# Patient Record
Sex: Male | Born: 1965 | Race: White | Hispanic: No | Smoking: Current every day smoker
Health system: Southern US, Community
[De-identification: ages and names within clinical notes are randomized; demographics above are authoritative.]

## PROBLEM LIST (undated history)

## (undated) DIAGNOSIS — F32A Depression, unspecified: Secondary | ICD-10-CM

## (undated) DIAGNOSIS — J40 Bronchitis, not specified as acute or chronic: Secondary | ICD-10-CM

## (undated) DIAGNOSIS — R51 Headache: Secondary | ICD-10-CM

## (undated) DIAGNOSIS — K5790 Diverticulosis of intestine, part unspecified, without perforation or abscess without bleeding: Secondary | ICD-10-CM

## (undated) DIAGNOSIS — F1010 Alcohol abuse, uncomplicated: Secondary | ICD-10-CM

## (undated) DIAGNOSIS — K922 Gastrointestinal hemorrhage, unspecified: Secondary | ICD-10-CM

## (undated) DIAGNOSIS — F17200 Nicotine dependence, unspecified, uncomplicated: Secondary | ICD-10-CM

## (undated) DIAGNOSIS — K649 Unspecified hemorrhoids: Secondary | ICD-10-CM

## (undated) DIAGNOSIS — F319 Bipolar disorder, unspecified: Secondary | ICD-10-CM

## (undated) DIAGNOSIS — K219 Gastro-esophageal reflux disease without esophagitis: Secondary | ICD-10-CM

## (undated) DIAGNOSIS — J439 Emphysema, unspecified: Secondary | ICD-10-CM

## (undated) DIAGNOSIS — R0600 Dyspnea, unspecified: Secondary | ICD-10-CM

## (undated) DIAGNOSIS — K432 Incisional hernia without obstruction or gangrene: Secondary | ICD-10-CM

## (undated) DIAGNOSIS — L0390 Cellulitis, unspecified: Secondary | ICD-10-CM

## (undated) DIAGNOSIS — R748 Abnormal levels of other serum enzymes: Secondary | ICD-10-CM

## (undated) DIAGNOSIS — Z8489 Family history of other specified conditions: Secondary | ICD-10-CM

## (undated) DIAGNOSIS — B977 Papillomavirus as the cause of diseases classified elsewhere: Secondary | ICD-10-CM

## (undated) DIAGNOSIS — I10 Essential (primary) hypertension: Secondary | ICD-10-CM

## (undated) DIAGNOSIS — T8859XA Other complications of anesthesia, initial encounter: Secondary | ICD-10-CM

## (undated) DIAGNOSIS — F419 Anxiety disorder, unspecified: Secondary | ICD-10-CM

## (undated) DIAGNOSIS — Z87898 Personal history of other specified conditions: Secondary | ICD-10-CM

## (undated) DIAGNOSIS — H409 Unspecified glaucoma: Secondary | ICD-10-CM

## (undated) DIAGNOSIS — F329 Major depressive disorder, single episode, unspecified: Secondary | ICD-10-CM

## (undated) DIAGNOSIS — Z8719 Personal history of other diseases of the digestive system: Secondary | ICD-10-CM

## (undated) DIAGNOSIS — G43909 Migraine, unspecified, not intractable, without status migrainosus: Secondary | ICD-10-CM

## (undated) DIAGNOSIS — G8929 Other chronic pain: Secondary | ICD-10-CM

## (undated) DIAGNOSIS — J209 Acute bronchitis, unspecified: Secondary | ICD-10-CM

## (undated) DIAGNOSIS — E162 Hypoglycemia, unspecified: Secondary | ICD-10-CM

## (undated) DIAGNOSIS — A630 Anogenital (venereal) warts: Secondary | ICD-10-CM

## (undated) HISTORY — DX: Anogenital (venereal) warts: A63.0

## (undated) HISTORY — DX: Emphysema, unspecified: J43.9

## (undated) HISTORY — DX: Acute bronchitis, unspecified: J20.9

## (undated) HISTORY — DX: Unspecified hemorrhoids: K64.9

## (undated) HISTORY — PX: WISDOM TOOTH EXTRACTION: SHX21

## (undated) HISTORY — DX: Anxiety disorder, unspecified: F41.9

## (undated) HISTORY — PX: ELBOW FRACTURE SURGERY: SHX616

---
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
## 2003-07-23 DIAGNOSIS — Z8711 Personal history of peptic ulcer disease: Secondary | ICD-10-CM

## 2003-07-23 HISTORY — DX: Personal history of peptic ulcer disease: Z87.11

## 2003-12-21 ENCOUNTER — Inpatient Hospital Stay (HOSPITAL_COMMUNITY): Admission: EM | Admit: 2003-12-21 | Discharge: 2003-12-22 | Payer: Self-pay | Admitting: Emergency Medicine

## 2008-06-13 ENCOUNTER — Emergency Department (HOSPITAL_COMMUNITY): Admission: EM | Admit: 2008-06-13 | Discharge: 2008-06-14 | Payer: Self-pay | Admitting: Emergency Medicine

## 2008-06-16 ENCOUNTER — Emergency Department (HOSPITAL_COMMUNITY): Admission: EM | Admit: 2008-06-16 | Discharge: 2008-06-16 | Payer: Self-pay | Admitting: Emergency Medicine

## 2008-12-19 ENCOUNTER — Emergency Department (HOSPITAL_BASED_OUTPATIENT_CLINIC_OR_DEPARTMENT_OTHER): Admission: EM | Admit: 2008-12-19 | Discharge: 2008-12-20 | Payer: Self-pay | Admitting: Emergency Medicine

## 2008-12-19 ENCOUNTER — Ambulatory Visit: Payer: Self-pay | Admitting: Radiology

## 2010-10-30 LAB — BASIC METABOLIC PANEL
BUN: 11 mg/dL (ref 6–23)
CO2: 25 mEq/L (ref 19–32)
Chloride: 110 mEq/L (ref 96–112)
Creatinine, Ser: 0.8 mg/dL (ref 0.4–1.5)
GFR calc non Af Amer: >60 mL/min (ref >60)
Sodium: 149 mEq/L — ABNORMAL HIGH (ref 135–145)

## 2010-10-30 LAB — CBC
Hemoglobin: 15 g/dL (ref 13.0–17.0)
MCHC: 32.8 g/dL (ref 30.0–36.0)
MCV: 92.2 fL (ref 78.0–100.0)
Platelets: 264 10*3/uL (ref 150–400)
RBC: 4.96 MIL/uL (ref 4.22–5.81)

## 2010-10-30 LAB — URINALYSIS, ROUTINE W REFLEX MICROSCOPIC
Bilirubin Urine: NEGATIVE
Ketones, ur: NEGATIVE mg/dL
Nitrite: NEGATIVE
pH: 5 (ref 5.0–8.0)

## 2010-10-30 LAB — DIFFERENTIAL
Eosinophils Relative: 1 % (ref 0–5)
Lymphocytes Relative: 25 % (ref 12–46)
Lymphs Abs: 1.7 10*3/uL (ref 0.7–4.0)
Monocytes Absolute: 0.5 10*3/uL (ref 0.1–1.0)
Monocytes Relative: 7 % (ref 3–12)
Neutro Abs: 4.7 10*3/uL (ref 1.7–7.7)
Neutrophils Relative %: 67 % (ref 43–77)

## 2010-12-07 NOTE — Op Note (Signed)
NAME:  Timothy [name], Timothy [name]                         ACCOUNT NO.:  [id number]   MEDICAL RECORD NO.:  [id number]                   PATIENT TYPE:  INP   LOCATION:  5707                                 FACILITY:  [facility]   PHYSICIAN:  Danise Edge, M.D.                DATE OF BIRTH:  1966/07/04   DATE OF PROCEDURE:  DATE OF DISCHARGE:                                 OPERATIVE REPORT   PROCEDURE:  Emergency esophagogastroduodenoscopy.   DESCRIPTION OF PROCEDURE:  After obtaining informed consent, Dodge was  placed in the left lateral decubitus position.  He received 100 mcg of  intravenous Phentanyl and 10 mg of intravenous Versed to achieve conscious  sedation for the procedure.  The patient's blood pressure, oxygen  saturation, and cardiac rhythm were monitored throughout the procedure and  documented in the medical record.   The Olympus gastroscope was passed through the posterior hypopharynx in the  proximal esophagus without difficulty.  The hypopharynx, larynx, and vocal  cords appeared normal.   Esophagoscopy:  The proximal, medial, and lower segments of the esophageal  mucosa appear normal.   Gastroscopy:  Retroflex view of the gastric cardia and fundus was normal.  Gastric body, antrum, and pylorus appear normal.   Duodenoscopy:  Upon entering the duodenal bulb, the mucosa in the distal  duodenum bulb appeared red.  There is no bleeding from the duodenal bulb,  and I do not detect ulceration or mucosal abnormalities.  The second and  third portions of the duodenum appear normal.   ASSESSMENT:  Normal esophagogastroduodenoscopy.  Etiology of  gastrointestinal bleeding remains undetermined.   PLAN:  Schedule colonoscopy for tomorrow.                                               Danise Edge, M.D.    MJ/MEDQ  D:  12/21/2003  T:  12/21/2003  Job:  [id number]

## 2010-12-07 NOTE — Op Note (Signed)
NAME:  Timothy [name], Timothy [name]                         ACCOUNT NO.:  [id number]   MEDICAL RECORD NO.:  [id number]                   PATIENT TYPE:  INP   LOCATION:  5707                                 FACILITY:  [facility]   PHYSICIAN:  Danise Edge, M.D.                DATE OF BIRTH:  1966/05/25   DATE OF PROCEDURE:  12/22/2003  DATE OF DISCHARGE:                                 OPERATIVE REPORT   PROCEDURE:  Diagnostic proctocolonoscopy to the cecum.   PROCEDURE INDICATION:  Mr. [name] is a 45 year old male born 1965/08/02.  Mr. [name] was admitted to [facility] through the  emergency room yesterday with gastrointestinal bleeding manifested by the  passage of melenic and then maroon-colored stool.  Emergency  esophagogastroduodenoscopy yesterday was normal.  Diagnostic  proctocolonoscopy is scheduled today.   ENDOSCOPIST:  Danise Edge, M.D.   PREMEDICATION:  Fentanyl 100 mcg, Versed 10 mg.   PROCEDURE:  After obtaining informed consent, Mr. [name] was placed in the  left lateral decubitus position.  I administered intravenous fentanyl and  intravenous Versed to achieve conscious sedation for the procedure.  The  patient's blood pressure, oxygen saturation, and cardiac rhythm were  monitored throughout the procedure and documented in the medical record.   Anal inspection and digital rectal exam were normal.  The Olympus adjustable  pediatric colonoscope was introduced into the rectum and advanced to the  cecum.  Colonic preparation for the exam today was excellent.   Rectum normal.  There are no significant internal or external hemorrhoids.   Sigmoid colon and descending colon:  Left colonic diverticulosis without  bleeding or diverticulitis.  I suspect Mr. [name] had lower gastrointestinal  bleeding secondary to a diverticular bleed, which has resolved.   Splenic flexure normal.   Transverse colon normal.   Hepatic flexure normal.   Ascending colon  normal.   Cecum and ileocecal valve normal.   ASSESSMENT:  1. Left colonic diverticulosis.  2. Otherwise normal proctocolonoscopy to the cecum.   RECOMMENDATIONS:  Mr. [name] has resolved.  I  think he probably had a diverticular bleed due to left colonic  diverticulosis.  He is being discharged from the hospital today in stable  medical condition.  I have recommended that he discontinue his daily Goody  Powder intake.                                               Danise Edge, M.D.    MJ/MEDQ  D:  12/22/2003  T:  12/22/2003  Job:  [id number]

## 2010-12-07 NOTE — H&P (Signed)
NAME:  Timothy [name], Timothy [name]                         ACCOUNT NO.:  [id number]   MEDICAL RECORD NO.:  [id number]                   PATIENT TYPE:  INP   LOCATION:  5707                                 FACILITY:  [facility]   PHYSICIAN:  Danise Edge, M.D.                DATE OF BIRTH:  1965-12-27   DATE OF ADMISSION:  12/21/2003  DATE OF DISCHARGE:                                HISTORY & PHYSICAL   ADMISSION DIAGNOSIS:  Gastrointestinal bleeding.   HISTORY:  Mr. Erie Radu is a 45 year old male residing in [place].  [place], [place].  He is in [place] for the [place] holiday visiting his  family.  Mr. Wyne takes Marlin Canary Powders daily.  He consumes beer in  moderation.  Last night, he developed epigastric discomfort, nausea, coffee  ground emesis, and began passing melenic stool which turned maroon color  this morning associated with lightheadedness.  There is no history of  gastrointestinal bleeding.  He has no chronic medical problems.   ALLERGIES:  No known drug allergies.   CHRONIC MEDICATIONS:  Goody Powders.   PAST MEDICAL HISTORY:  1. Appendectomy.  2. Gastroesophageal reflux disease.   FAMILY HISTORY:  Noncontributory.   HABITS:  Denzell does smoke cigarettes and consumes alcohol in moderation.   PHYSICAL EXAMINATION:  VITAL SIGNS:  Blood pressure 164/86, pulse 103 and  regular, room air oxygen saturation 100%.  GENERAL APPEARANCE:  Bohdi is alert and attentive.  HEENT:  Pale ___________conjunctivae.  Nonicteric sclerae.  CARDIAC:  Sinus tachycardia.  No murmurs, clicks, or rubs.  LUNGS:  Clear to auscultation.  ABDOMEN:  Soft, flat, and nontender.  EXTREMITIES:  No edema.   ASSESSMENT:  Gastrointestinal bleeding.                                                Danise Edge, M.D.    MJ/MEDQ  D:  12/21/2003  T:  12/21/2003  Job:  [id number]

## 2011-04-23 LAB — CBC
MCHC: 33.7
Platelets: 239
RBC: 4.56
RDW: 12.4

## 2011-04-23 LAB — DIFFERENTIAL
Eosinophils Relative: 1
Neutro Abs: 9.3 — ABNORMAL HIGH

## 2011-04-23 LAB — POCT I-STAT, CHEM 8
Creatinine, Ser: 0.9
HCT: 45
Hemoglobin: 15.3
TCO2: 25

## 2011-08-12 ENCOUNTER — Emergency Department (HOSPITAL_COMMUNITY)
Admission: EM | Admit: 2011-08-12 | Discharge: 2011-08-13 | Disposition: A | Discharge status: Home / Self Care | Payer: Self-pay | Attending: Emergency Medicine | Admitting: Emergency Medicine

## 2011-08-12 ENCOUNTER — Encounter (HOSPITAL_COMMUNITY): Payer: Self-pay | Admitting: Emergency Medicine

## 2011-08-12 ENCOUNTER — Emergency Department (HOSPITAL_COMMUNITY): Payer: Self-pay

## 2011-08-12 DIAGNOSIS — F17200 Nicotine dependence, unspecified, uncomplicated: Secondary | ICD-10-CM | POA: Insufficient documentation

## 2011-08-12 DIAGNOSIS — R112 Nausea with vomiting, unspecified: Secondary | ICD-10-CM | POA: Insufficient documentation

## 2011-08-12 DIAGNOSIS — R109 Unspecified abdominal pain: Secondary | ICD-10-CM | POA: Insufficient documentation

## 2011-08-12 DIAGNOSIS — K5792 Diverticulitis of intestine, part unspecified, without perforation or abscess without bleeding: Secondary | ICD-10-CM

## 2011-08-12 DIAGNOSIS — R197 Diarrhea, unspecified: Secondary | ICD-10-CM | POA: Insufficient documentation

## 2011-08-12 HISTORY — DX: Cellulitis, unspecified: L03.90

## 2011-08-12 LAB — COMPREHENSIVE METABOLIC PANEL
ALT: 21 U/L (ref 0–53)
AST: 14 U/L (ref 0–37)
Albumin: 3.9 g/dL (ref 3.5–5.2)
Alkaline Phosphatase: 83 U/L (ref 39–117)
BUN: 9 mg/dL (ref 6–23)
CO2: 23 mEq/L (ref 19–32)
Calcium: 9.6 mg/dL (ref 8.4–10.5)
Chloride: 100 mEq/L (ref 96–112)
Creatinine, Ser: 0.76 mg/dL (ref 0.50–1.35)
GFR calc Af Amer: >90 mL/min (ref >90)
GFR calc non Af Amer: >90 mL/min (ref >90)
Glucose, Bld: 104 mg/dL — ABNORMAL HIGH (ref 70–99)
Potassium: 3.3 mEq/L — ABNORMAL LOW (ref 3.5–5.1)
Sodium: 135 mEq/L (ref 135–145)
Total Bilirubin: 0.3 mg/dL (ref 0.3–1.2)
Total Protein: 7.4 g/dL (ref 6.0–8.3)

## 2011-08-12 LAB — URINALYSIS, ROUTINE W REFLEX MICROSCOPIC
Bilirubin Urine: NEGATIVE
Glucose, UA: NEGATIVE mg/dL
Ketones, ur: NEGATIVE mg/dL
Nitrite: NEGATIVE
Protein, ur: NEGATIVE mg/dL
Specific Gravity, Urine: 1.014 (ref 1.005–1.030)
Urobilinogen, UA: 0.2 mg/dL (ref 0.0–1.0)
pH: 6 (ref 5.0–8.0)

## 2011-08-12 LAB — DIFFERENTIAL
Basophils Absolute: 0 10*3/uL (ref 0.0–0.1)
Basophils Relative: 0 % (ref 0–1)
Eosinophils Absolute: 0.1 10*3/uL (ref 0.0–0.7)
Eosinophils Relative: 0 % (ref 0–5)
Lymphocytes Relative: 20 % (ref 12–46)
Lymphs Abs: 2.7 10*3/uL (ref 0.7–4.0)
Monocytes Absolute: 1.7 10*3/uL — ABNORMAL HIGH (ref 0.1–1.0)
Monocytes Relative: 13 % — ABNORMAL HIGH (ref 3–12)
Neutro Abs: 9 10*3/uL — ABNORMAL HIGH (ref 1.7–7.7)
Neutrophils Relative %: 67 % (ref 43–77)

## 2011-08-12 LAB — URINE MICROSCOPIC-ADD ON

## 2011-08-12 LAB — CBC
HCT: 39.4 % (ref 39.0–52.0)
Hemoglobin: 13.5 g/dL (ref 13.0–17.0)
MCH: 30.7 pg (ref 26.0–34.0)
MCHC: 34.3 g/dL (ref 30.0–36.0)
MCV: 89.5 fL (ref 78.0–100.0)
Platelets: 310 10*3/uL (ref 150–400)
RBC: 4.4 MIL/uL (ref 4.22–5.81)
RDW: 12.5 % (ref 11.5–15.5)
WBC: 13.5 10*3/uL — ABNORMAL HIGH (ref 4.0–10.5)

## 2011-08-12 LAB — LIPASE, BLOOD: Lipase: 29 U/L (ref 11–59)

## 2011-08-12 MED ORDER — SODIUM CHLORIDE 0.9 % IV SOLN: INTRAVENOUS | Status: DC

## 2011-08-12 MED ORDER — ONDANSETRON HCL 4 MG/2ML IJ SOLN
4.0000 mg | Freq: Once | INTRAMUSCULAR | Status: AC
Start: 2011-08-12 — End: 2011-08-12
  Administered 2011-08-12: 4 mg via INTRAVENOUS
  Filled 2011-08-12: qty 2

## 2011-08-12 MED ORDER — HYDROCODONE-ACETAMINOPHEN 5-325 MG PO TABS: 2.0000 | ORAL_TABLET | ORAL | Status: AC | PRN

## 2011-08-12 MED ORDER — CIPROFLOXACIN HCL 500 MG PO TABS: 500.0000 mg | ORAL_TABLET | Freq: Two times a day (BID) | ORAL | Status: AC

## 2011-08-12 MED ORDER — IOHEXOL 300 MG/ML  SOLN
100.0000 mL | Freq: Once | INTRAMUSCULAR | Status: AC | PRN
  Administered 2011-08-12: 100 mL via INTRAVENOUS

## 2011-08-12 MED ORDER — METRONIDAZOLE 500 MG PO TABS: 500.0000 mg | ORAL_TABLET | Freq: Two times a day (BID) | ORAL | Status: AC

## 2011-08-12 MED ORDER — POTASSIUM CHLORIDE CRYS ER 20 MEQ PO TBCR
20.0000 meq | EXTENDED_RELEASE_TABLET | Freq: Once | ORAL | Status: AC
  Administered 2011-08-12: 20 meq via ORAL
  Filled 2011-08-12: qty 1

## 2011-08-12 MED ORDER — MORPHINE SULFATE 4 MG/ML IJ SOLN
4.0000 mg | Freq: Once | INTRAMUSCULAR | Status: AC
  Administered 2011-08-12: 4 mg via INTRAVENOUS
  Filled 2011-08-12: qty 1

## 2011-08-12 MED ORDER — PROMETHAZINE HCL 25 MG PO TABS: 25.0000 mg | ORAL_TABLET | Freq: Four times a day (QID) | ORAL | Status: DC | PRN

## 2011-08-12 MED ORDER — SODIUM CHLORIDE 0.9 % IV BOLUS (SEPSIS)
500.0000 mL | Freq: Once | INTRAVENOUS | Status: AC
  Administered 2011-08-12: 1000 mL via INTRAVENOUS

## 2011-08-12 NOTE — ED Notes (Signed)
Per pt., sternal pain two days ago-pain moved to LUQ-hurts to breath, change positions, cough

## 2011-08-12 NOTE — ED Provider Notes (Signed)
History     CSN: [id number]  Arrival date & time 08/12/11  4098   First MD Initiated Contact with Patient 08/12/11 2022      Chief Complaint  Patient presents with  . Abdominal Pain    HPI: Patient is a 46 y.o. male presenting with abdominal pain. The history is provided by the patient.  Abdominal Pain The primary symptoms of the illness include abdominal pain, nausea, vomiting and diarrhea. The primary symptoms of the illness do not include fever or dysuria. The current episode started more than 2 days ago. The onset of the illness was gradual. The problem has been gradually worsening.   patient reports onset of. Umbilical abdominal pain that radiates to the left lower part route on Wednesday that worsened by Thursday and Friday. States that movement coughing or sneezing makes the pain worse. Has had multiple episodes of nausea vomiting and diarrhea since onset of pain. Denies known fever. These symptoms were preceded by approximately one week history of flulike symptoms. Wife states patient has lost approximately 15 pounds in one to 2 weeks because he does not want to eat.. Past Medical History  Diagnosis Date  . Diverticulitis   . Cellulitis     Past Surgical History  Procedure Date  . Appendectomy     No family history on file.  History  Substance Use Topics  . Smoking status: Current Some Day Smoker -- 3.0 packs/day  . Smokeless tobacco: Not on file  . Alcohol Use: Yes     occasionally      Review of Systems  Constitutional: Negative.  Negative for fever.  HENT: Negative.   Eyes: Negative.   Respiratory: Negative.   Cardiovascular: Negative.   Gastrointestinal: Positive for nausea, vomiting, abdominal pain and diarrhea.  Genitourinary: Negative.  Negative for dysuria.  Musculoskeletal: Negative.   Skin: Negative.   Neurological: Negative.   Hematological: Negative.   Psychiatric/Behavioral: Negative.     Allergies  Review of patient's allergies indicates  no known allergies.  Home Medications  No current outpatient prescriptions on file.  BP 141/89  Pulse 129  Temp(Src) 98.2 F (36.8 C) (Oral)  Resp 18  SpO2 96%  Physical Exam  Constitutional: He appears well-developed and well-nourished.  HENT:  Head: Normocephalic and atraumatic.  Eyes: Conjunctivae are normal.  Neck: Neck supple.  Cardiovascular: Normal rate and regular rhythm.   Pulmonary/Chest: Effort normal and breath sounds normal.  Abdominal: Soft. Bowel sounds are normal. There is tenderness.    Musculoskeletal: Normal range of motion.  Neurological: He is alert.  Skin: Skin is warm and dry.  Psychiatric: He has a normal mood and affect.    ED Course  Procedures Findings and clinical impression discussed with patient. Treatment options discussed, hospitalization vs treatment at home with PO antibiotics and medication for pain and nausea. Patient much prefers going home and agrees that he will return if symptoms worsen such as fever, increased nausea, vomiting, diarrhea and or worsening pain. I have discussed patient with Dr. Lars Mage and was in agreement with discharge plan.  Labs Reviewed  CBC - Abnormal; Notable for the following:    WBC 13.5 (*)    All other components within normal limits  DIFFERENTIAL - Abnormal; Notable for the following:    Neutro Abs 9.0 (*)    Monocytes Relative 13 (*)    Monocytes Absolute 1.7 (*)    All other components within normal limits  COMPREHENSIVE METABOLIC PANEL - Abnormal; Notable for the following:  Potassium 3.3 (*)    Glucose, Bld 104 (*)    All other components within normal limits  URINALYSIS, ROUTINE W REFLEX MICROSCOPIC - Abnormal; Notable for the following:    APPearance CLOUDY (*)    Hgb urine dipstick TRACE (*)    Leukocytes, UA MODERATE (*)    All other components within normal limits  LIPASE, BLOOD  OCCULT BLOOD, POC DEVICE  URINE MICROSCOPIC-ADD ON  POCT OCCULT BLOOD STOOL, DEVICE   Ct Abdomen Pelvis W  Contrast  08/12/2011  *RADIOLOGY REPORT*  Clinical Data: Upper quadrant abdominal pain; pain on inspiration.  CT ABDOMEN AND PELVIS WITH CONTRAST  Technique:  Multidetector CT imaging of the abdomen and pelvis was performed following the standard protocol during bolus administration of intravenous contrast.  Contrast: OMNIPAQUE IOHEXOL 300 MG/ML IV SOLN  Comparison: CT of the abdomen and pelvis performed 12/19/2008  Findings: The visualized lung bases are clear.  The liver and spleen are unremarkable in appearance.  The gallbladder is within normal limits.  The pancreas and adrenal glands are unremarkable.  The kidneys are unremarkable in appearance.  There is no evidence of hydronephrosis.  No renal or ureteral stones are seen.  Minimal left-sided perinephric stranding appears to reflect the adjacent colonic inflammatory process.  The small bowel is unremarkable in appearance.  The stomach is within normal limits.  No acute vascular abnormalities are seen. Scattered calcification is noted along the abdominal aorta and its branches, mildly advanced for age.  The patient is status post appendectomy.  Focal soft tissue inflammation is noted along the proximal descending colon, with associated inflamed diverticula and colonic wall thickening, compatible with acute diverticulitis.  Mild free fluid is seen tracking along the left paracolic gutter.  There is no evidence of perforation or abscess formation.  Diffuse diverticulosis is seen along the descending and sigmoid colon.  The bladder is mildly distended and grossly unremarkable in appearance.  The prostate remains normal in size.  A somewhat superiorly positioned right testis is noted; its position is likely transient in nature.  No inguinal lymphadenopathy is seen.  No acute osseous abnormalities are identified.  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   IMPRESSION:  1.  Acute diverticulitis noted along the proximal descending colon, with soft tissue inflammation and mild free fluid tracking along the right paracolic gutter.  No evidence of perforation or abscess formation. 2.  Diffuse diverticulosis noted along the descending and sigmoid colon. 3.  Somewhat superiorly positioned right testis noted; its position is likely transient in nature, though clinical correlation is suggested to exclude an incompletely descended testis. 4.  Scattered calcification along the abdominal aorta and its branches, mildly advanced for age. 5.  Chronic bilateral pars defects at L5, with minimal grade 1 anterolisthesis of L5 on S1, and minimal grade 1 retrolisthesis of L4 on L5.  Original Report Authenticated By: Tonia Ghent, M.D.     No diagnosis found.    MDM  HPi/PE and clinical findings c/w acute diverticulitis w/o perforation or abscess.        Leanne Chang, NP 08/12/11 2312

## 2011-08-13 NOTE — ED Provider Notes (Signed)
Medical screening examination/treatment/procedure(s) were performed by non-physician practitioner and as supervising physician I was immediately available for consultation/collaboration. Devoria Albe, MD, Armando Gang   Ward Givens, MD 08/13/11 1038

## 2011-09-20 DIAGNOSIS — R748 Abnormal levels of other serum enzymes: Secondary | ICD-10-CM

## 2011-09-20 HISTORY — DX: Abnormal levels of other serum enzymes: R74.8

## 2011-10-15 ENCOUNTER — Observation Stay (HOSPITAL_COMMUNITY)
Admission: EM | Admit: 2011-10-15 | Discharge: 2011-10-16 | Disposition: A | Discharge status: Home / Self Care | Payer: Medicaid Other | Attending: Internal Medicine | Admitting: Internal Medicine

## 2011-10-15 ENCOUNTER — Emergency Department (HOSPITAL_COMMUNITY): Payer: Medicaid Other

## 2011-10-15 ENCOUNTER — Encounter (HOSPITAL_COMMUNITY): Payer: Self-pay | Admitting: *Deleted

## 2011-10-15 DIAGNOSIS — R112 Nausea with vomiting, unspecified: Secondary | ICD-10-CM | POA: Insufficient documentation

## 2011-10-15 DIAGNOSIS — Z8719 Personal history of other diseases of the digestive system: Secondary | ICD-10-CM

## 2011-10-15 DIAGNOSIS — F17200 Nicotine dependence, unspecified, uncomplicated: Secondary | ICD-10-CM | POA: Insufficient documentation

## 2011-10-15 DIAGNOSIS — Z720 Tobacco use: Secondary | ICD-10-CM

## 2011-10-15 DIAGNOSIS — R197 Diarrhea, unspecified: Secondary | ICD-10-CM | POA: Insufficient documentation

## 2011-10-15 DIAGNOSIS — K5792 Diverticulitis of intestine, part unspecified, without perforation or abscess without bleeding: Secondary | ICD-10-CM

## 2011-10-15 DIAGNOSIS — F1010 Alcohol abuse, uncomplicated: Secondary | ICD-10-CM | POA: Insufficient documentation

## 2011-10-15 DIAGNOSIS — K922 Gastrointestinal hemorrhage, unspecified: Secondary | ICD-10-CM | POA: Insufficient documentation

## 2011-10-15 DIAGNOSIS — Z79899 Other long term (current) drug therapy: Secondary | ICD-10-CM | POA: Insufficient documentation

## 2011-10-15 DIAGNOSIS — D649 Anemia, unspecified: Secondary | ICD-10-CM | POA: Insufficient documentation

## 2011-10-15 DIAGNOSIS — K5732 Diverticulitis of large intestine without perforation or abscess without bleeding: Principal | ICD-10-CM | POA: Insufficient documentation

## 2011-10-15 DIAGNOSIS — K219 Gastro-esophageal reflux disease without esophagitis: Secondary | ICD-10-CM | POA: Insufficient documentation

## 2011-10-15 DIAGNOSIS — Z7982 Long term (current) use of aspirin: Secondary | ICD-10-CM | POA: Insufficient documentation

## 2011-10-15 HISTORY — DX: Hypoglycemia, unspecified: E16.2

## 2011-10-15 HISTORY — DX: Diverticulosis of intestine, part unspecified, without perforation or abscess without bleeding: K57.90

## 2011-10-15 HISTORY — DX: Alcohol abuse, uncomplicated: F10.10

## 2011-10-15 HISTORY — DX: Gastrointestinal hemorrhage, unspecified: K92.2

## 2011-10-15 HISTORY — DX: Gastro-esophageal reflux disease without esophagitis: K21.9

## 2011-10-15 LAB — PROTIME-INR: INR: 0.93 (ref 0.00–1.49)

## 2011-10-15 LAB — TYPE AND SCREEN
ABO/RH(D): O POS
Antibody Screen: NEGATIVE

## 2011-10-15 LAB — GLUCOSE, CAPILLARY: Glucose-Capillary: 91 mg/dL (ref 70–99)

## 2011-10-15 LAB — CBC
HCT: 26.6 % — ABNORMAL LOW (ref 39.0–52.0)
MCH: 30.7 pg (ref 26.0–34.0)
MCHC: 33.1 g/dL (ref 30.0–36.0)
MCV: 92.2 fL (ref 78.0–100.0)
MCV: 93.3 fL (ref 78.0–100.0)
Platelets: 264 10*3/uL (ref 150–400)
Platelets: 222 10*3/uL (ref 150–400)
RDW: 13.7 % (ref 11.5–15.5)
RDW: 14 % (ref 11.5–15.5)
WBC: 8.2 10*3/uL (ref 4.0–10.5)
WBC: 6.7 10*3/uL (ref 4.0–10.5)

## 2011-10-15 LAB — DIFFERENTIAL
Basophils Absolute: 0 10*3/uL (ref 0.0–0.1)
Eosinophils Absolute: 0.1 10*3/uL (ref 0.0–0.7)
Eosinophils Relative: 1 % (ref 0–5)
Lymphocytes Relative: 33 % (ref 12–46)
Neutrophils Relative %: 59 % (ref 43–77)

## 2011-10-15 LAB — COMPREHENSIVE METABOLIC PANEL
ALT: 16 U/L (ref 0–53)
AST: 18 U/L (ref 0–37)
Albumin: 3.6 g/dL (ref 3.5–5.2)
Calcium: 9 mg/dL (ref 8.4–10.5)
Sodium: 138 mEq/L (ref 135–145)
Total Protein: 6.4 g/dL (ref 6.0–8.3)

## 2011-10-15 LAB — OCCULT BLOOD, POC DEVICE: Fecal Occult Bld: POSITIVE

## 2011-10-15 MED ORDER — CIPROFLOXACIN IN D5W 400 MG/200ML IV SOLN
400.0000 mg | Freq: Once | INTRAVENOUS | Status: AC
  Administered 2011-10-15: 400 mg via INTRAVENOUS
  Filled 2011-10-15: qty 200

## 2011-10-15 MED ORDER — PANTOPRAZOLE SODIUM 40 MG PO TBEC
40.0000 mg | DELAYED_RELEASE_TABLET | Freq: Every day | ORAL | Status: DC
  Administered 2011-10-15: 40 mg via ORAL
  Filled 2011-10-15 (×2): qty 1

## 2011-10-15 MED ORDER — MORPHINE SULFATE 2 MG/ML IJ SOLN: 2.0000 mg | INTRAMUSCULAR | Status: DC | PRN

## 2011-10-15 MED ORDER — CIPROFLOXACIN HCL 500 MG PO TABS
500.0000 mg | ORAL_TABLET | Freq: Two times a day (BID) | ORAL | Status: DC
  Administered 2011-10-15 – 2011-10-16 (×2): 500 mg via ORAL
  Filled 2011-10-15 (×3): qty 1

## 2011-10-15 MED ORDER — SODIUM CHLORIDE 0.9 % IV SOLN: 250.0000 mL | INTRAVENOUS | Status: DC | PRN

## 2011-10-15 MED ORDER — ONDANSETRON HCL 4 MG/2ML IJ SOLN: 4.0000 mg | Freq: Four times a day (QID) | INTRAMUSCULAR | Status: DC | PRN

## 2011-10-15 MED ORDER — SODIUM CHLORIDE 0.9 % IV SOLN
INTRAVENOUS | Status: DC
  Administered 2011-10-15: 10:00:00 via INTRAVENOUS

## 2011-10-15 MED ORDER — ONDANSETRON HCL 4 MG PO TABS: 4.0000 mg | ORAL_TABLET | Freq: Four times a day (QID) | ORAL | Status: DC | PRN

## 2011-10-15 MED ORDER — METRONIDAZOLE 500 MG PO TABS
500.0000 mg | ORAL_TABLET | Freq: Four times a day (QID) | ORAL | Status: DC
  Administered 2011-10-15 – 2011-10-16 (×3): 500 mg via ORAL
  Filled 2011-10-15 (×7): qty 1

## 2011-10-15 MED ORDER — NICOTINE 21 MG/24HR TD PT24
21.0000 mg | MEDICATED_PATCH | Freq: Every day | TRANSDERMAL | Status: DC
  Administered 2011-10-15 – 2011-10-16 (×2): 21 mg via TRANSDERMAL
  Filled 2011-10-15 (×2): qty 1

## 2011-10-15 MED ORDER — SODIUM CHLORIDE 0.9 % IJ SOLN
3.0000 mL | Freq: Two times a day (BID) | INTRAMUSCULAR | Status: DC
  Administered 2011-10-15: 3 mL via INTRAVENOUS

## 2011-10-15 MED ORDER — METRONIDAZOLE IN NACL 5-0.79 MG/ML-% IV SOLN
500.0000 mg | Freq: Once | INTRAVENOUS | Status: AC
  Administered 2011-10-15: 500 mg via INTRAVENOUS
  Filled 2011-10-15: qty 100

## 2011-10-15 MED ORDER — OXYCODONE HCL 5 MG PO TABS: 5.0000 mg | ORAL_TABLET | ORAL | Status: DC | PRN

## 2011-10-15 MED ORDER — LORAZEPAM 0.5 MG PO TABS: 0.5000 mg | ORAL_TABLET | Freq: Four times a day (QID) | ORAL | Status: DC | PRN

## 2011-10-15 MED ORDER — IOHEXOL 300 MG/ML  SOLN
100.0000 mL | Freq: Once | INTRAMUSCULAR | Status: AC | PRN
  Administered 2011-10-15: 100 mL via INTRAVENOUS

## 2011-10-15 MED ORDER — ZOLPIDEM TARTRATE 5 MG PO TABS: 5.0000 mg | ORAL_TABLET | Freq: Every evening | ORAL | Status: DC | PRN

## 2011-10-15 MED ORDER — SODIUM CHLORIDE 0.9 % IJ SOLN: 3.0000 mL | Freq: Two times a day (BID) | INTRAMUSCULAR | Status: DC

## 2011-10-15 MED ORDER — SODIUM CHLORIDE 0.9 % IJ SOLN: 3.0000 mL | INTRAMUSCULAR | Status: DC | PRN

## 2011-10-15 MED ORDER — ALUM & MAG HYDROXIDE-SIMETH 200-200-20 MG/5ML PO SUSP: 30.0000 mL | Freq: Four times a day (QID) | ORAL | Status: DC | PRN

## 2011-10-15 NOTE — ED Notes (Signed)
Pt returned from CT °

## 2011-10-15 NOTE — ED Notes (Signed)
Pt has completed CT contrast. CT informed.

## 2011-10-15 NOTE — H&P (Signed)
History and Physical  Timothy [name] ZOX:[id number] DOB: 10-Dec-1965 DOA: 10/15/2011  Referring physician: Samuel Jester, MD PCP: None  Chief Complaint: nausea, vomiting  HPI:  46 year old man presented to the emergency department today at the insistence of his wife with a history of bleeding and vomiting. Patient reports he had 2 episodes of vomiting yesterday, nonbloody. Multiple episodes of diarrhea yesterday grossly bloody. Last episode diarrhea was 4 PM yesterday. He has had no further bleeding since that time. No further vomiting. He feels quite hungry at this point. He denies any abdominal pain or fever.  Wife reports alcohol abuse and binge drinking. Patient takes Marlin Canary powders on a daily basis. Patient has a history of known diverticulosis by endoscopy 2005 when he was admitted for GI bleed. Upper endoscopy at that time was unremarkable.  Workup in the emergency department was notable for a significant drop in hemoglobin. Possible diverticulitis on CAT scan.  Chart Review:  08/12/2011 ED visit: Diverticulitis. Patient refused admission at that time.  Review of Systems:  Denies fever, changes to his vision, sore throat, rash, muscle aches, chest pain, shortness of breath, dysuria.  Past Medical History  Diagnosis Date  . Diverticulitis   . Hypoglycemia   . GERD (gastroesophageal reflux disease)   . Diverticulosis     By colonoscopy June 2005  . Lower GI bleed     June 2005. Presumably secondary to diverticulosis.  . Alcohol abuse   . Active smoker    Past Surgical History  Procedure Date  . Appendectomy    Social History:  reports that he has been smoking Cigarettes.  He has been smoking about 3 packs per day. He has never used smokeless tobacco. He reports that he drinks alcohol. He reports that he does not use illicit drugs. Wife reports binge drinking. Allergies  Allergen Reactions  . Darvocet (Propoxyphene N-Acetaminophen) Hives   Family History  Problem  Relation Age of Onset  . Diabetes Mother   . Parkinsonism Mother     Prior to Admission medications   Medication Sig Start Date End Date Taking? Authorizing Provider  Aspirin-Acetaminophen-Caffeine (GOODY HEADACHE PO) Take by mouth every 12 (twelve) hours as needed. For headache   Yes Historical Provider, MD  HYDROcodone-acetaminophen (VICODIN) 5-500 MG per tablet Take 1 tablet by mouth every 6 (six) hours as needed. For pain   Yes Historical Provider, MD  ibuprofen (ADVIL,MOTRIN) 200 MG tablet Take 400 mg by mouth every 6 (six) hours as needed. For pain   Yes Historical Provider, MD  promethazine (PHENERGAN) 25 MG tablet Take 25 mg by mouth every 6 (six) hours as needed. For nausea and vomiting   Yes Historical Provider, MD   Physical Exam: Filed Vitals:   10/15/11 8119 10/15/11 0949 10/15/11 0951 10/15/11 0953  BP: 165/95 132/75 131/82 136/86  Pulse: 99 90 91 100  Temp: 97.9 F (36.6 C) 98.3 F (36.8 C)    TempSrc: Oral Oral    Resp: 16 20    SpO2: 100% 99%      General:  Appears calm and comfortable. Exam and in the emergency department. Well-appearing.  Eyes: Pupils equal, round, reactive to light. Normal lids and irises.  ENT: Grossly normal hearing. Normal lips and tongue.  Neck:  No lymphadenopathy or masses. No thyromegaly.  Cardiovascular: Regular rate and rhythm. No murmur, rub, gallop. No lower extremity edema.  Respiratory: Clear to auscultation bilaterally. No wheezes, rales, rhonchi. Normal respiratory effort.  Abdomen: Soft, nontender, nondistended.  Skin: Appears grossly  unremarkable.  Musculoskeletal: Grossly unremarkable.  Psychiatric: Grossly normal mood and affect. Speech fluent and appropriate.  Labs on Admission:  Basic Metabolic Panel:  Lab 10/15/11 1610  NA 138  K 3.9  CL 103  CO2 26  GLUCOSE 101*  BUN 17  CREATININE 0.75  CALCIUM 9.0  MG --  PHOS --   Liver Function Tests:  Lab 10/15/11 1030  AST 18  ALT 16  ALKPHOS 63    BILITOT 0.2*  PROT 6.4  ALBUMIN 3.6    Lab 10/15/11 1030  LIPASE 40  AMYLASE --   CBC:  Lab 10/15/11 1030  WBC 8.2  NEUTROABS 4.8  HGB 9.4*  HCT 28.2*  MCV 92.2  PLT 264   CBG:  Lab 10/15/11 0946  GLUCAP 91   Radiological Exams on Admission: Ct Abdomen Pelvis W Contrast  10/15/2011  *RADIOLOGY REPORT*  Clinical Data: Abdominal pain, nausea, vomiting.  History of diverticulitis.  Bloody diarrhea.  CT ABDOMEN AND PELVIS WITH CONTRAST  Technique:  Multidetector CT imaging of the abdomen and pelvis was performed following the standard protocol during bolus administration of intravenous contrast.  Contrast:  100 ml Omnipaque 300 IV.  Comparison: 08/12/2011  Findings: Lung bases are clear.  No effusions.  Heart is normal size.  Liver, gallbladder, spleen, pancreas, adrenals and kidneys are normal.  Diffuse descending colonic and sigmoid diverticulosis.  There is slight stranding around the descending colon which is much improved since prior study.  I suspect this represents chronic changes/scar from prior diverticulitis.  Mild stranding adjacent to the proximal sigmoid colon could reflect early changes of acute diverticulitis. Small bowel is decompressed.  No free fluid, free air or adenopathy.  Urinary bladder is unremarkable.  Aorta and iliac vessels demonstrate mild calcifications.  IMPRESSION: Suspect early changes of diverticulitis within the proximal sigmoid colon.  Diffuse descending colonic and sigmoid diverticulosis.  Original Report Authenticated By: Cyndie Chime, M.D.   Assessment/Plan 1. Nausea, vomiting, diarrhea: Resolved. Possibly secondary to diverticulitis or acute gastroenteritis. Monitor. 2. Possible early diverticulitis: Patient denies any pain and is afebrile with a normal white blood cell count. However this may have precipitated his GI bleed. Oral antibiotics. 3. GI bleed: Presumably lower. Patient denies any abdominal pain. Most likely etiology would be  diverticulosis as seen in the past. I doubt an upper source. BUN within normal limits. Serial CBC. PPI. Unless condition worsens, patient has further bleeding or hemoglobin drops significantly will hold on gastroenterology consultation. 4. Anemia, presumably acute blood loss: I specifically recommended discontinuing NSAIDs. Serial CBC. 5. Alcohol abuse: Monitor for withdrawal. 6. Cigarette smoker: Recommended cessation.  Code Status: Full code Family Communication: Discussed with mother at bedside with patient's permission. Disposition Plan: Pending further evaluation and treatment. If no further bleeding and his of a discharge March 27  Brendia Sacks, MD  [facility] Pager [phone] 10/15/2011, 12:54 PM

## 2011-10-15 NOTE — ED Provider Notes (Signed)
History     CSN: [id number]  Arrival date & time 10/15/11  4098   First MD Initiated Contact with Patient 10/15/11 [phone]      Chief Complaint  Patient presents with  . Diverticulosis    HPI Pt was seen at 0930.  Per pt and spouse, c/o gradual onset and improvement of multiple intermittent episodes of N/V/D x2 days.  States all his stools have been "black with red blood in them."  N/V/D was assoc with generalized abd "pain," generalized weakness/fatigue, and poor PO intake.  States his N/V improved last night after he took a "nausea pill."  Last bloody BM was last night.  Denies fevers, no CP/SOB, no cough, no back pain.      Past Medical History  Diagnosis Date  . Diverticulitis   . Cellulitis   . Hypoglycemia   . GERD (gastroesophageal reflux disease)     Past Surgical History  Procedure Date  . Appendectomy     History  Substance Use Topics  . Smoking status: Current Everyday Smoker -- 3.0 packs/day  . Smokeless tobacco: Not on file  . Alcohol Use: Yes     occasionally    Review of Systems ROS: Statement: All systems negative except as marked or noted in the HPI; Constitutional: Negative for fever and chills. +decreased PO intake; ; Eyes: Negative for eye pain, redness and discharge. ; ; ENMT: Negative for ear pain, hoarseness, nasal congestion, sinus pressure and sore throat. ; ; Cardiovascular: Negative for chest pain, palpitations, diaphoresis, dyspnea and peripheral edema. ; ; Respiratory: Negative for cough, wheezing and stridor. ; ; Gastrointestinal: +N/V/D, abd pain, blood in stool.  Negative for hematemesis, jaundice and rectal bleeding. . ; ; Genitourinary: Negative for dysuria, flank pain and hematuria. ; ; Musculoskeletal: Negative for back pain and neck pain. Negative for swelling and trauma.; ; Skin: Negative for pruritus, rash, abrasions, blisters, bruising and skin lesion.; ; Neuro: +generalized weakness. Negative for headache, lightheadedness and neck  stiffness. Negative for altered level of consciousness , altered mental status, extremity weakness, paresthesias, involuntary movement, seizure and syncope.     Allergies  Darvocet  Home Medications   Current Outpatient Rx  Name Route Sig Dispense Refill  . GOODY HEADACHE PO Oral Take by mouth every 12 (twelve) hours as needed. For headache    . HYDROCODONE-ACETAMINOPHEN 5-500 MG PO TABS Oral Take 1 tablet by mouth every 6 (six) hours as needed. For pain    . IBUPROFEN 200 MG PO TABS Oral Take 400 mg by mouth every 6 (six) hours as needed. For pain    . PROMETHAZINE HCL 25 MG PO TABS Oral Take 25 mg by mouth every 6 (six) hours as needed. For nausea and vomiting      BP 165/95  Pulse 99  Temp(Src) 97.9 F (36.6 C) (Oral)  Resp 16  SpO2 100%  Physical Exam 0935: Physical examination:  Nursing notes reviewed; Vital signs and O2 SAT reviewed;  Constitutional: Well developed, Well nourished, Well hydrated, In no acute distress; Head:  Normocephalic, atraumatic; Eyes: EOMI, PERRL, No scleral icterus; ENMT: Mouth and pharynx normal, Mucous membranes moist; Neck: Supple, Full range of motion, No lymphadenopathy; Cardiovascular: Regular rate and rhythm, No murmur or gallop; Respiratory: Breath sounds clear & equal bilaterally, No rales, rhonchi, wheezes, Normal respiratory effort/excursion; Chest: Nontender, Movement normal; Abdomen: Soft, Nontender, Nondistended, Normal bowel sounds; Rectal exam performed w/permission of pt.  Anal tone normal.  Non-tender, soft maroon stool in rectal vault, heme  positive.  No fissures, no external hemorrhoids, no palp masses.; Extremities: Pulses normal, No tenderness, No edema, No calf edema or asymmetry.; Neuro: AA&Ox3, Major CN grossly intact.  No gross focal motor or sensory deficits in extremities.; Skin: Color pale, Warm, Dry, no rash.    ED Course  Procedures    MDM  MDM Reviewed: nursing note, vitals and previous chart Reviewed previous: CT  scan Interpretation: labs and CT scan   Results for orders placed during the hospital encounter of 10/15/11  OCCULT BLOOD, POC DEVICE      Component Value Range   Fecal Occult Bld POSITIVE    LIPASE, BLOOD      Component Value Range   Lipase 40  11 - 59 (U/L)  CBC      Component Value Range   WBC 8.2  4.0 - 10.5 (K/uL)   RBC 3.06 (*) 4.22 - 5.81 (MIL/uL)   Hemoglobin 9.4 (*) 13.0 - 17.0 (g/dL)   HCT 46.9 (*) 62.9 - 52.0 (%)   MCV 92.2  78.0 - 100.0 (fL)   MCH 30.7  26.0 - 34.0 (pg)   MCHC 33.3  30.0 - 36.0 (g/dL)   RDW 52.8  41.3 - 24.4 (%)   Platelets 264  150 - 400 (K/uL)  DIFFERENTIAL      Component Value Range   Neutrophils Relative 59  43 - 77 (%)   Neutro Abs 4.8  1.7 - 7.7 (K/uL)   Lymphocytes Relative 33  12 - 46 (%)   Lymphs Abs 2.7  0.7 - 4.0 (K/uL)   Monocytes Relative 7  3 - 12 (%)   Monocytes Absolute 0.6  0.1 - 1.0 (K/uL)   Eosinophils Relative 1  0 - 5 (%)   Eosinophils Absolute 0.1  0.0 - 0.7 (K/uL)   Basophils Relative 0  0 - 1 (%)   Basophils Absolute 0.0  0.0 - 0.1 (K/uL)  COMPREHENSIVE METABOLIC PANEL      Component Value Range   Sodium 138  135 - 145 (mEq/L)   Potassium 3.9  3.5 - 5.1 (mEq/L)   Chloride 103  96 - 112 (mEq/L)   CO2 26  19 - 32 (mEq/L)   Glucose, Bld 101 (*) 70 - 99 (mg/dL)   BUN 17  6 - 23 (mg/dL)   Creatinine, Ser 0.10  0.50 - 1.35 (mg/dL)   Calcium 9.0  8.4 - 27.2 (mg/dL)   Total Protein 6.4  6.0 - 8.3 (g/dL)   Albumin 3.6  3.5 - 5.2 (g/dL)   AST 18  0 - 37 (U/L)   ALT 16  0 - 53 (U/L)   Alkaline Phosphatase 63  39 - 117 (U/L)   Total Bilirubin 0.2 (*) 0.3 - 1.2 (mg/dL)   GFR calc non Af Amer >90  >90 (mL/min)   GFR calc Af Amer >90  >90 (mL/min)  APTT      Component Value Range   aPTT 27  24 - 37 (seconds)  PROTIME-INR      Component Value Range   Prothrombin Time 12.7  11.6 - 15.2 (seconds)   INR 0.93  0.00 - 1.49   GLUCOSE, CAPILLARY      Component Value Range   Glucose-Capillary 91  70 - 99 (mg/dL)   Ct Abdomen  Pelvis W Contrast 10/15/2011  *RADIOLOGY REPORT*  Clinical Data: Abdominal pain, nausea, vomiting.  History of diverticulitis.  Bloody diarrhea.  CT ABDOMEN AND PELVIS WITH CONTRAST  Technique:  Multidetector CT imaging  of the abdomen and pelvis was performed following the standard protocol during bolus administration of intravenous contrast.  Contrast:  100 ml Omnipaque 300 IV.  Comparison: 08/12/2011  Findings: Lung bases are clear.  No effusions.  Heart is normal size.  Liver, gallbladder, spleen, pancreas, adrenals and kidneys are normal.  Diffuse descending colonic and sigmoid diverticulosis.  There is slight stranding around the descending colon which is much improved since prior study.  I suspect this represents chronic changes/scar from prior diverticulitis.  Mild stranding adjacent to the proximal sigmoid colon could reflect early changes of acute diverticulitis. Small bowel is decompressed.  No free fluid, free air or adenopathy.  Urinary bladder is unremarkable.  Aorta and iliac vessels demonstrate mild calcifications.  IMPRESSION: Suspect early changes of diverticulitis within the proximal sigmoid colon.  Diffuse descending colonic and sigmoid diverticulosis.  Original Report Authenticated By: Cyndie Chime, M.D.    Results for DEUNTAE, KOCSIS (MRN [id number]) as of 10/15/2011 12:41  Ref. Range 08/12/2011 21:42 10/15/2011 10:30  HGB Latest Range: 13.0-17.0 g/dL 84.1 9.4 (L)  HCT Latest Range: 39.0-52.0 % 39.4 28.2 (L)      12:41 PM:  Pt with significant drop in H/H since last ED visit for similar complaint 2 months ago.  No N/V or stooling while in ED.  Will start IV abx now for diverticulitis, will need re-check H/H so will admit for further observation.  Dx testing d/w pt and family.  Questions answered.  Verb understanding, agreeable to admit.   1:05 PM:  T/C to [facility] Irene Limbo, case discussed, including:  HPI, pertinent PM/SHx, VS/PE, dx testing, ED course and treatment:  Agreeable to  admit, requests to obtain regular bed to team 3.       Laray Anger, DO 10/16/11 2156

## 2011-10-15 NOTE — ED Notes (Signed)
Wife out to desk to report to RN that pt was not truthful during triage about his ETOH use. She sts he drinks heavily everyday and had a binge on Sunday and symptoms started after this. Also sts he reported abd pain to her but denied it to this RN because "he doesn't want to stay in the hospital." Wife would like pt to have his "pancreas checked out, because of his drinking. Last time he was here they said something about it, but he didn't listen to them."

## 2011-10-15 NOTE — ED Notes (Signed)
Pt reports Hx of diverticulosis. Having symptoms that feel similar since Sunday. N/V on Sunday which has resolved. Diarrhea yesterday x5 and noted blood in stool at that time. Denies abd pain at this time. Took a nausea pill last night that resolved nausea. Also reports he is hypoglycemic and has not eaten since last night. Feels weak and dizzy at times. Wife reports he appears pale to her.

## 2011-10-16 DIAGNOSIS — Z8719 Personal history of other diseases of the digestive system: Secondary | ICD-10-CM

## 2011-10-16 DIAGNOSIS — D649 Anemia, unspecified: Secondary | ICD-10-CM

## 2011-10-16 DIAGNOSIS — Z720 Tobacco use: Secondary | ICD-10-CM

## 2011-10-16 HISTORY — DX: Personal history of other diseases of the digestive system: Z87.19

## 2011-10-16 LAB — CBC
Hemoglobin: 8.7 g/dL — ABNORMAL LOW (ref 13.0–17.0)
MCH: 31 pg (ref 26.0–34.0)
MCHC: 33.3 g/dL (ref 30.0–36.0)
MCV: 92.7 fL (ref 78.0–100.0)
Platelets: 202 10*3/uL (ref 150–400)
Platelets: 218 10*3/uL (ref 150–400)
RBC: 2.86 MIL/uL — ABNORMAL LOW (ref 4.22–5.81)
RDW: 13.8 % (ref 11.5–15.5)
RDW: 13.8 % (ref 11.5–15.5)
WBC: 6 10*3/uL (ref 4.0–10.5)

## 2011-10-16 LAB — BASIC METABOLIC PANEL
CO2: 29 mEq/L (ref 19–32)
Chloride: 104 mEq/L (ref 96–112)
Creatinine, Ser: 0.68 mg/dL (ref 0.50–1.35)
GFR calc Af Amer: >90 mL/min (ref >90)
Potassium: 3.7 mEq/L (ref 3.5–5.1)
Sodium: 140 mEq/L (ref 135–145)

## 2011-10-16 MED ORDER — CIPROFLOXACIN HCL 500 MG PO TABS: 500.0000 mg | ORAL_TABLET | Freq: Two times a day (BID) | ORAL | Status: AC

## 2011-10-16 MED ORDER — OMEPRAZOLE 20 MG PO CPDR: 20.0000 mg | DELAYED_RELEASE_CAPSULE | Freq: Every day | ORAL | Status: DC

## 2011-10-16 MED ORDER — METRONIDAZOLE 500 MG PO TABS: 500.0000 mg | ORAL_TABLET | Freq: Three times a day (TID) | ORAL | Status: AC

## 2011-10-16 MED ORDER — ONDANSETRON HCL 4 MG PO TABS: 4.0000 mg | ORAL_TABLET | Freq: Three times a day (TID) | ORAL | Status: AC | PRN

## 2011-10-16 NOTE — Progress Notes (Signed)
   CARE MANAGEMENT NOTE 10/16/2011  Patient:  Timothy [name], Timothy [name]   Account Number:  [id number]  Date Initiated:  10/16/2011  Documentation initiated by:  Lanier Clam  Subjective/Objective Assessment:   ADMITTED W/N/V     Action/Plan:   FROM HOME   Anticipated DC Date:  10/16/2011   Anticipated DC Plan:  HOME/SELF CARE  In-house referral  NA      DC Planning Services  NA      [facility] Choice  NA   Choice offered to / List presented to:     DME arranged  NA      DME agency  NA     HH arranged  NA      HH agency  NA   Status of service:  Completed, signed off Medicare Important Message given?   (If response is "NO", the following Medicare IM given date fields will be blank) Date Medicare IM given:   Date Additional Medicare IM given:    Discharge Disposition:  HOME/SELF CARE  Per UR Regulation:  Reviewed for med. necessity/level of care/duration of stay  If discussed at Long Length of Stay Meetings, dates discussed:    Comments:  10/16/11 [facility] RN,BSN NCM [phone]

## 2011-10-16 NOTE — Discharge Summary (Signed)
Physician Discharge Summary  Patient ID: SYLVESTER MINTON MRN: [id number] DOB/AGE: 1965/09/06 46 y.o.  Admit date: 10/15/2011 Discharge date: 10/16/2011  Primary Care Physician:  No primary provider on file.   Discharge Diagnoses:    Principal Problem:  *Diverticulitis Active Problems:  Anemia  Tobacco abuse  Lower GI Bleed   Medication List  As of 10/16/2011  9:29 AM   STOP taking these medications         GOODY HEADACHE PO      HYDROcodone-acetaminophen 5-500 MG per tablet      ibuprofen 200 MG tablet      promethazine 25 MG tablet         TAKE these medications         ciprofloxacin 500 MG tablet   Commonly known as: CIPRO   Take 1 tablet (500 mg total) by mouth 2 (two) times daily.      metroNIDAZOLE 500 MG tablet   Commonly known as: FLAGYL   Take 1 tablet (500 mg total) by mouth 3 (three) times daily.      omeprazole 20 MG capsule   Commonly known as: PRILOSEC   Take 1 capsule (20 mg total) by mouth daily.      ondansetron 4 MG tablet   Commonly known as: ZOFRAN   Take 1 tablet (4 mg total) by mouth every 8 (eight) hours as needed for nausea.             Disposition and Follow-up:  Will be discharged home today in stable and improved condition. Strongly advised to followup with a new PCP for health maintenance issues.  Consults:  None    Significant Diagnostic Studies:  Ct Abdomen Pelvis W Contrast  10/15/2011  *RADIOLOGY REPORT*  Clinical Data: Abdominal pain, nausea, vomiting.  History of diverticulitis.  Bloody diarrhea.  CT ABDOMEN AND PELVIS WITH CONTRAST  Technique:  Multidetector CT imaging of the abdomen and pelvis was performed following the standard protocol during bolus administration of intravenous contrast.  Contrast:  100 ml Omnipaque 300 IV.  Comparison: 08/12/2011  Findings: Lung bases are clear.  No effusions.  Heart is normal size.  Liver, gallbladder, spleen, pancreas, adrenals and kidneys are normal.  Diffuse descending colonic  and sigmoid diverticulosis.  There is slight stranding around the descending colon which is much improved since prior study.  I suspect this represents chronic changes/scar from prior diverticulitis.  Mild stranding adjacent to the proximal sigmoid colon could reflect early changes of acute diverticulitis. Small bowel is decompressed.  No free fluid, free air or adenopathy.  Urinary bladder is unremarkable.  Aorta and iliac vessels demonstrate mild calcifications.  IMPRESSION: Suspect early changes of diverticulitis within the proximal sigmoid colon.  Diffuse descending colonic and sigmoid diverticulosis.  Original Report Authenticated By: Cyndie Chime, M.D.    Brief H and P: For complete details please refer to admission H and P, but in brief patient is a 46 year old man who presented to the emergency department today at the insistence of his wife with a history of bleeding and vomiting. Patient reports he had 2 episodes of vomiting yesterday, nonbloody. Multiple episodes of diarrhea yesterday grossly bloody. Last episode diarrhea was 4 PM yesterday. He has had no further bleeding since that time. No further vomiting. He feels quite hungry at this point. He denies any abdominal pain or fever.Patient takes Marlin Canary powders on a daily basis. Patient has a history of known diverticulosis by endoscopy 2005 when he was admitted for GI  bleed. Upper endoscopy at that time was unremarkable.      Hospital Course:  Principal Problem:  *Diverticulitis Active Problems:  Anemia  Tobacco abuse  Lower GI Bleed  #1 Diverticulitis: Per Ct Scan. Is the likely etiology of his symptoms. Cipro/flagyl for 21 days. No further n/v/diarrhea.  #2 Lower GI Bleed: Spontaneously resolved. No need for GI eval currently. Likely a diverticular bleed. Advised to discontinue use of Goody Powder and other NSAIDS.  #3 Anemia: panel pending. Has not required transfusion. Hb has been stable.  #4 Dispo: Patient ready and anxious  for DC home today. Really needs a PCP to followup with long term. Discussed with him and his wife.  Time spent on Discharge: Greater than 30 minutes.  SignedChaya Jan [facility] Pager: [phone] 10/16/2011, 9:29 AM

## 2011-10-29 ENCOUNTER — Ambulatory Visit (INDEPENDENT_AMBULATORY_CARE_PROVIDER_SITE_OTHER): Payer: Self-pay | Admitting: Family Medicine

## 2011-10-29 ENCOUNTER — Encounter: Payer: Self-pay | Admitting: Family Medicine

## 2011-10-29 VITALS — BP 138/96 | HR 78 | Temp 98.5°F | Ht 70.0 in | Wt 192.0 lb

## 2011-10-29 DIAGNOSIS — F1010 Alcohol abuse, uncomplicated: Secondary | ICD-10-CM | POA: Insufficient documentation

## 2011-10-29 DIAGNOSIS — R5383 Other fatigue: Secondary | ICD-10-CM

## 2011-10-29 DIAGNOSIS — Z13220 Encounter for screening for lipoid disorders: Secondary | ICD-10-CM

## 2011-10-29 DIAGNOSIS — Z23 Encounter for immunization: Secondary | ICD-10-CM

## 2011-10-29 DIAGNOSIS — K5732 Diverticulitis of large intestine without perforation or abscess without bleeding: Secondary | ICD-10-CM

## 2011-10-29 DIAGNOSIS — K5792 Diverticulitis of intestine, part unspecified, without perforation or abscess without bleeding: Secondary | ICD-10-CM

## 2011-10-29 DIAGNOSIS — F1011 Alcohol abuse, in remission: Secondary | ICD-10-CM | POA: Insufficient documentation

## 2011-10-29 DIAGNOSIS — R358 Other polyuria: Secondary | ICD-10-CM

## 2011-10-29 DIAGNOSIS — F17200 Nicotine dependence, unspecified, uncomplicated: Secondary | ICD-10-CM

## 2011-10-29 DIAGNOSIS — D649 Anemia, unspecified: Secondary | ICD-10-CM

## 2011-10-29 DIAGNOSIS — Z720 Tobacco use: Secondary | ICD-10-CM

## 2011-10-29 LAB — POCT URINALYSIS DIPSTICK
Bilirubin, UA: NEGATIVE
Leukocytes, UA: NEGATIVE
Nitrite, UA: NEGATIVE
pH, UA: 6.5

## 2011-10-29 LAB — POCT UA - MICROSCOPIC ONLY

## 2011-10-29 MED ORDER — PNEUMOCOCCAL VAC POLYVALENT 25 MCG/0.5ML IJ INJ: 0.5000 mL | INJECTION | Freq: Once | INTRAMUSCULAR | Status: DC

## 2011-10-29 NOTE — Assessment & Plan Note (Signed)
Urinalysis wnl.  a1c does not indicated diabetes.  States has not had any alcohol in 3 weeks.  Will continue to follow.

## 2011-10-29 NOTE — Assessment & Plan Note (Signed)
Gave info on quit line- has done well- cut back from 3 packs per day to 1-1.5 using nicotine patches.

## 2011-10-29 NOTE — Assessment & Plan Note (Signed)
Positive Audit-c.  Does not feel alcohol intereferes with his life and sees no need for modification at this time..  Will return for follow-up of anxiety.  Has positive family history of alcoholism and bipolar disorder.

## 2011-10-29 NOTE — Patient Instructions (Addendum)
Will give you pneumonia vaccine today.  Quitting smoking is the #1 thing you can do for your health- please see info on the quit line- they may be able to offer you free nicotine patches  Please make an appointment to get fasting bloodwork at the lab ( food or drink except water in 8 hours)  We will check your cholesterol and your blood counts at that time.  Take a daily multivitamin (like centrum or one a day) to build back your iron.  Pick the least expensive generic  I would recommend cutting back on alcohol.  Follow-up in 1 week

## 2011-10-29 NOTE — Assessment & Plan Note (Signed)
Advised multivitamin.  Will check CBC when he returns for bloodwork in 3 days

## 2011-10-29 NOTE — Progress Notes (Signed)
  Subjective:    Patient ID: Timothy [name], male    DOB: 02-Jun-1966, 46 y.o.   MRN: [id number]  HPI Here to establish primary care after hospitalization for diverticulitis and GI bleed  Was admitted March 27 for diverticulitis.  Hgb in 8's at that time.  Is tolerating cipro/flagyl well.  No longer any abdominal pain or diarrhea/bleeding.  Still feels very fatigued.  No dyspnea or syncope.  Patient Information Form: Screening and ROS  AUDIT-C Score: 9 Do you feel safe in relationships? yes PHQ-2:negative  Review of Symptoms  General:  Negative for nexplained weight loss, fever Skin: Negative for new or changing mole, sore that won't heal HEENT: Negative for trouble hearing, trouble seeing, ringing in ears, mouth sores, hoarseness, change in voice, dysphagia. CV:  Negative for chest pain, dyspnea, edema, palpitations Resp: Negative for cough, dyspnea, hemoptysis GI: Negative for nausea, vomiting, diarrhea, constipation, abdominal pain, melena, hematochezia. GU: Negative for dysuria, incontinence, urinary hesitance, hematuria, vaginal or penile discharge,  sexual difficulty, lumps in testicle or breasts MSK: Negative for , joint pain or swelling Neuro: Negative for headaches, weakness, numbness,  passing out/fainting Psych: Negative for depression,  memory problems  Positive for: anxiety, frequent urination, muscle cramps or aches, dizziness,polyuria, Review of Systems See HPI    Objective:   Physical Exam GEN: Alert & Oriented, No acute distress HEENT: [place]/AT. EOMI, PERRLA, no conjunctival injection or scleral icterus.  Bilateral tympanic membranes intact without erythema or effusion.  .  Nares without edema or rhinorrhea.  Oropharynx is without erythema or exudates.  No anterior or posterior cervical lymphadenopathy. CV:  Regular Rate & Rhythm, no murmur Respiratory:  Normal work of breathing, CTAB Abd:  + BS, soft, no tenderness to palpation Ext: no pre-tibial edema Psych:  alert, oriented.       Assessment & Plan:  Given pneumovax, declines tdap at this time

## 2011-10-29 NOTE — Assessment & Plan Note (Signed)
Resolving- advised to finish course of antibiotics.

## 2011-11-01 ENCOUNTER — Other Ambulatory Visit: Payer: Self-pay

## 2011-11-01 DIAGNOSIS — R358 Other polyuria: Secondary | ICD-10-CM

## 2011-11-01 DIAGNOSIS — Z13220 Encounter for screening for lipoid disorders: Secondary | ICD-10-CM

## 2011-11-01 DIAGNOSIS — R5383 Other fatigue: Secondary | ICD-10-CM

## 2011-11-01 DIAGNOSIS — F1010 Alcohol abuse, uncomplicated: Secondary | ICD-10-CM

## 2011-11-01 LAB — COMPREHENSIVE METABOLIC PANEL
ALT: 17 U/L (ref 0–53)
AST: 20 U/L (ref 0–37)
Albumin: 4 g/dL (ref 3.5–5.2)
Alkaline Phosphatase: 64 U/L (ref 39–117)
Glucose, Bld: 97 mg/dL (ref 70–99)
Potassium: 4.1 mEq/L (ref 3.5–5.3)
Sodium: 139 mEq/L (ref 135–145)
Total Protein: 6.3 g/dL (ref 6.0–8.3)

## 2011-11-01 LAB — CBC
Platelets: 395 10*3/uL (ref 150–400)
RBC: 3.75 MIL/uL — ABNORMAL LOW (ref 4.22–5.81)
RDW: 14.2 % (ref 11.5–15.5)
WBC: 5 10*3/uL (ref 4.0–10.5)

## 2011-11-01 LAB — LIPID PANEL
Total CHOL/HDL Ratio: 4.8 Ratio
VLDL: 20 mg/dL (ref 0–40)

## 2011-11-01 NOTE — Progress Notes (Signed)
CMP,FLP,TSH AND CBC DONE TODAY Hyman Crossan 

## 2011-11-04 ENCOUNTER — Ambulatory Visit (INDEPENDENT_AMBULATORY_CARE_PROVIDER_SITE_OTHER): Payer: Self-pay | Admitting: Family Medicine

## 2011-11-04 ENCOUNTER — Ambulatory Visit (HOSPITAL_COMMUNITY)
Admission: RE | Admit: 2011-11-04 | Discharge: 2011-11-04 | Disposition: A | Discharge status: Home / Self Care | Payer: Medicaid Other | Source: Ambulatory Visit | Attending: Family Medicine | Admitting: Family Medicine

## 2011-11-04 ENCOUNTER — Inpatient Hospital Stay (HOSPITAL_COMMUNITY)
Admission: AD | Admit: 2011-11-04 | Discharge: 2011-11-06 | DRG: 392 | Disposition: A | Discharge status: Home / Self Care | Payer: Medicaid Other | Source: Ambulatory Visit | Attending: Family Medicine | Admitting: Family Medicine

## 2011-11-04 DIAGNOSIS — K5792 Diverticulitis of intestine, part unspecified, without perforation or abscess without bleeding: Secondary | ICD-10-CM

## 2011-11-04 DIAGNOSIS — K5732 Diverticulitis of large intestine without perforation or abscess without bleeding: Secondary | ICD-10-CM

## 2011-11-04 DIAGNOSIS — R300 Dysuria: Secondary | ICD-10-CM | POA: Diagnosis present

## 2011-11-04 DIAGNOSIS — K5730 Diverticulosis of large intestine without perforation or abscess without bleeding: Secondary | ICD-10-CM

## 2011-11-04 DIAGNOSIS — K5900 Constipation, unspecified: Secondary | ICD-10-CM | POA: Diagnosis present

## 2011-11-04 DIAGNOSIS — R109 Unspecified abdominal pain: Secondary | ICD-10-CM

## 2011-11-04 DIAGNOSIS — K219 Gastro-esophageal reflux disease without esophagitis: Secondary | ICD-10-CM | POA: Diagnosis present

## 2011-11-04 DIAGNOSIS — D500 Iron deficiency anemia secondary to blood loss (chronic): Secondary | ICD-10-CM | POA: Diagnosis present

## 2011-11-04 DIAGNOSIS — Z8719 Personal history of other diseases of the digestive system: Secondary | ICD-10-CM | POA: Diagnosis present

## 2011-11-04 DIAGNOSIS — F17200 Nicotine dependence, unspecified, uncomplicated: Secondary | ICD-10-CM | POA: Diagnosis present

## 2011-11-04 LAB — URINALYSIS, ROUTINE W REFLEX MICROSCOPIC
Glucose, UA: NEGATIVE mg/dL
Ketones, ur: NEGATIVE mg/dL
Leukocytes, UA: NEGATIVE
Nitrite: NEGATIVE
pH: 6.5 (ref 5.0–8.0)

## 2011-11-04 LAB — CBC
Hemoglobin: 10.3 g/dL — ABNORMAL LOW (ref 13.0–17.0)
MCH: 28.9 pg (ref 26.0–34.0)
Platelets: 317 10*3/uL (ref 150–400)
RBC: 3.56 MIL/uL — ABNORMAL LOW (ref 4.22–5.81)
WBC: 12.3 10*3/uL — ABNORMAL HIGH (ref 4.0–10.5)

## 2011-11-04 LAB — BASIC METABOLIC PANEL
CO2: 26 mEq/L (ref 19–32)
Calcium: 9.2 mg/dL (ref 8.4–10.5)
Creat: 0.83 mg/dL (ref 0.50–1.35)

## 2011-11-04 LAB — CREATININE, SERUM
Creatinine, Ser: 0.8 mg/dL (ref 0.50–1.35)
GFR calc Af Amer: >90 mL/min (ref >90)
GFR calc non Af Amer: >90 mL/min (ref >90)

## 2011-11-04 LAB — CBC WITH DIFFERENTIAL/PLATELET
Basophils Relative: 0 % (ref 0–1)
Eosinophils Absolute: 0 10*3/uL (ref 0.0–0.7)
Eosinophils Relative: 0 % (ref 0–5)
HCT: 34.2 % — ABNORMAL LOW (ref 39.0–52.0)
Hemoglobin: 10.7 g/dL — ABNORMAL LOW (ref 13.0–17.0)
Lymphocytes Relative: 14 % (ref 12–46)
Lymphs Abs: 1.7 10*3/uL (ref 0.7–4.0)
Neutro Abs: 9.4 10*3/uL — ABNORMAL HIGH (ref 1.7–7.7)
Neutrophils Relative %: 76 % (ref 43–77)
WBC: 12.4 10*3/uL — ABNORMAL HIGH (ref 4.0–10.5)

## 2011-11-04 LAB — URINE MICROSCOPIC-ADD ON

## 2011-11-04 MED ORDER — CIPROFLOXACIN IN D5W 400 MG/200ML IV SOLN
400.0000 mg | Freq: Two times a day (BID) | INTRAVENOUS | Status: DC
  Administered 2011-11-04: 400 mg via INTRAVENOUS
  Filled 2011-11-04 (×2): qty 200

## 2011-11-04 MED ORDER — PANTOPRAZOLE SODIUM 40 MG IV SOLR
40.0000 mg | INTRAVENOUS | Status: DC
  Administered 2011-11-04 – 2011-11-05 (×2): 40 mg via INTRAVENOUS
  Filled 2011-11-04 (×4): qty 40

## 2011-11-04 MED ORDER — NICOTINE 7 MG/24HR TD PT24
7.0000 mg | MEDICATED_PATCH | Freq: Every day | TRANSDERMAL | Status: DC
  Administered 2011-11-04 – 2011-11-06 (×3): 7 mg via TRANSDERMAL
  Filled 2011-11-04 (×3): qty 1

## 2011-11-04 MED ORDER — HYDROCODONE-ACETAMINOPHEN 5-325 MG PO TABS: 1.0000 | ORAL_TABLET | ORAL | Status: DC | PRN

## 2011-11-04 MED ORDER — HEPARIN SODIUM (PORCINE) 5000 UNIT/ML IJ SOLN
5000.0000 [IU] | Freq: Three times a day (TID) | INTRAMUSCULAR | Status: DC
  Administered 2011-11-04 – 2011-11-06 (×5): 5000 [IU] via SUBCUTANEOUS
  Filled 2011-11-04 (×9): qty 1

## 2011-11-04 MED ORDER — ACETAMINOPHEN 325 MG PO TABS: 650.0000 mg | ORAL_TABLET | Freq: Four times a day (QID) | ORAL | Status: DC | PRN

## 2011-11-04 MED ORDER — ONDANSETRON HCL 4 MG PO TABS: 4.0000 mg | ORAL_TABLET | Freq: Three times a day (TID) | ORAL | Status: DC | PRN

## 2011-11-04 MED ORDER — DIPHENHYDRAMINE HCL 25 MG PO CAPS
25.0000 mg | ORAL_CAPSULE | ORAL | Status: DC | PRN
  Administered 2011-11-04: 25 mg via ORAL
  Filled 2011-11-04: qty 1

## 2011-11-04 MED ORDER — PIPERACILLIN-TAZOBACTAM 3.375 G IVPB
3.3750 g | Freq: Three times a day (TID) | INTRAVENOUS | Status: DC
  Administered 2011-11-04 – 2011-11-06 (×6): 3.375 g via INTRAVENOUS
  Filled 2011-11-04 (×8): qty 50

## 2011-11-04 MED ORDER — METRONIDAZOLE IN NACL 5-0.79 MG/ML-% IV SOLN
500.0000 mg | Freq: Three times a day (TID) | INTRAVENOUS | Status: DC
  Administered 2011-11-04 – 2011-11-06 (×5): 500 mg via INTRAVENOUS
  Filled 2011-11-04 (×7): qty 100

## 2011-11-04 MED ORDER — DOCUSATE SODIUM 100 MG PO CAPS
100.0000 mg | ORAL_CAPSULE | Freq: Two times a day (BID) | ORAL | Status: DC
  Administered 2011-11-05 – 2011-11-06 (×3): 100 mg via ORAL
  Filled 2011-11-04 (×4): qty 1

## 2011-11-04 MED ORDER — ACETAMINOPHEN 650 MG RE SUPP: 650.0000 mg | Freq: Four times a day (QID) | RECTAL | Status: DC | PRN

## 2011-11-04 MED ORDER — POTASSIUM CHLORIDE IN NACL 20-0.45 MEQ/L-% IV SOLN
INTRAVENOUS | Status: DC
  Administered 2011-11-04 – 2011-11-05 (×4): via INTRAVENOUS
  Filled 2011-11-04 (×8): qty 1000

## 2011-11-04 MED ORDER — IOHEXOL 300 MG/ML  SOLN
80.0000 mL | Freq: Once | INTRAMUSCULAR | Status: AC | PRN
  Administered 2011-11-04: 80 mL via INTRAVENOUS

## 2011-11-04 MED ORDER — POLYETHYLENE GLYCOL 3350 17 G PO PACK
17.0000 g | PACK | Freq: Every day | ORAL | Status: DC
  Administered 2011-11-05: 17 g via ORAL
  Filled 2011-11-04 (×4): qty 1

## 2011-11-04 MED ORDER — HYDROCODONE-ACETAMINOPHEN 5-500 MG PO TABS: 1.0000 | ORAL_TABLET | Freq: Three times a day (TID) | ORAL | Status: DC | PRN

## 2011-11-04 MED ORDER — HYDROMORPHONE HCL PF 1 MG/ML IJ SOLN
0.5000 mg | INTRAMUSCULAR | Status: DC | PRN
  Administered 2011-11-04: 0.5 mg via INTRAVENOUS
  Filled 2011-11-04: qty 1

## 2011-11-04 NOTE — Patient Instructions (Signed)
Miralax- one capful daily- titrate for soft bowel movements Take it easy until your pain is gone- choose light foods and liquids Continue to take antibiotics Will get CT abdomen today- I will call you with results Keep follow-up in 3 days  Will discuss referral to gastroenterologist and starting fiber at that time

## 2011-11-04 NOTE — Assessment & Plan Note (Addendum)
Pain today due to diverticulitis.  Will get CT abd.pelvis with contrast to evaluate for possible partial obstruction (which seems to be resolving) vs abscess (fevers last night).  Will draw CBC with diff today (was normal 3 days ago).  Given clinically well- no nausea, pain well controlled, eating full PO diet, will continue with supportive care for likely resolving obstruction.  Will continue metronidazole/flagyl, rxed vicodin, patient to continue fleets prn and star miralax.  Advised to consider foods that are less heavy.  If no abscess, will follow-up in 3 days.  Uninsured- intimately will need specialty care for recurrent diverticulitis.  Update: CT shows extensive worsening of diverticulitis despite still on cipro/flagyl with possible abscess.  I have arranged for patient to be direct admitted to inpatient team for further care- IV antibiotics.  Patient and his wife are agreeable.

## 2011-11-04 NOTE — Progress Notes (Signed)
  Subjective:    Patient ID: Timothy [name], male    DOB: 1965-12-24, 46 y.o.   MRN: [id number]  HPI here for abdominal pain  Was seen last week- establish priamry care after episode of acute diverticulitis (march 26-27).  Pain had fully resolved at that time.  Is still on 3 week course of cipro/flagyl  Notes 2 days ago started to have abdominal pain in LLQ associated with constipation. Had a small amount of BRBPR x 1, resolved.  No diarrhea.    Last night did fleets enema and also this morning with some stool, feels like he is passing gas better.  No bloating.  No nausea, ate a bacon egg and cheese sandwich for breakfast today with no problem.  Also noted felt hot and had sweats last  Night- now resolved.  Widf notes pain was moderate to severe last night- he does not want to go to the ER.  Tried to drink alcohol several days ago- other than 1 beer, has not had any alcohol otherwise.  I have reviewed patient's  PMH, FH, and Social history and Medications as related to this visit.  Review of Systems see HPI   Objective:   Physical Exam GEN: Alert & Oriented, No acute distress, walking and got onto exam table comfortably. CV:  Regular Rate & Rhythm, no murmur Respiratory:  Normal work of breathing, CTAB Abd:  decreased BS, soft, mild tenderness to palpation LLQ, no rebound or guarding.         Assessment & Plan:

## 2011-11-04 NOTE — H&P (Signed)
Family Medicine Teaching [facility] Admission History and Physical  Patient name: Timothy [name] Medical record number: [id number] Date of birth: 08/12/1965 Age: 46 y.o. Gender: male  Primary Care Provider: Delbert Harness, MD, MD  Chief Complaint: abdominal pain History of Present Illness: URBANO MILHOUSE is a 46 y.o. year old male with recent admission for diverticulitis presenting with worsening of diverticulitis despite outpatient antibiotics.  He was admitted March 26-27 for acute divericulitis and discharged on PO cipro and flagyl.  He was doing well until 2 days ago when he had some LLQ abdominal pain.  He also had some constipation and was not passing gas, this resolved following a fleets enema at home.  Did have a small amount of BRBPR yesterday x1.  Has been tolerating PO without nausea or vomiting.  Did have subjective fever and chills last night with sweats.  Does have some mild pain with initiation of urination.  Denies chest pain, shortness of breath, diarrhea, leg edema, bloating.  CT abd/pelvis showed worsening diverticulitis and white count was elevated, prompting a direct admit.   Patient Active Problem List  Diagnoses  . Diverticulitis  . Anemia  . Tobacco abuse  . Polyuria  . Alcohol abuse   Past Medical History: Past Medical History  Diagnosis Date  . Diverticulitis   . Hypoglycemia   . GERD (gastroesophageal reflux disease)   . Diverticulosis     By colonoscopy June 2005  . Lower GI bleed     June 2005. Presumably secondary to diverticulosis.  . Active smoker   . Alcohol abuse     Past Surgical History: Past Surgical History  Procedure Date  . Appendectomy     Social History: History   Social History  . Marital Status: Single    Spouse Name: N/A    Number of Children: N/A  . Years of Education: N/A   Social History Main Topics  . Smoking status: Current Everyday Smoker -- 3.0 packs/day --> 1.0ppd    Types: Cigarettes  . Smokeless tobacco:  Never Used  . Alcohol Use: Yes     Occasionally (binge drinker, no alcohol for last month except for 1 beer 2 days ago)  . Drug Use: No  . Sexually Active: Not on file   Other Topics Concern  . Not on file   Social History Narrative   Lives with wife [place], Children Estevan (age 28) Loel Lofty (age 57) Redmond Baseman (age 76).  Unemployed less than high school education.  Updated 10/29/11    Family History: Family History  Problem Relation Age of Onset  . Diabetes Mother   . Parkinsonism Mother   . Depression Mother   . Alcohol abuse Father   . Depression Father   . Hypertension Father   . Cancer Maternal Aunt     breast  . Stroke Neg Hx   . Heart disease Neg Hx     Allergies: Allergies  Allergen Reactions  . Darvocet (Propoxacet-N) Hives   Current Facility-Administered Medications on File Prior to Encounter  Medication Dose Route Frequency Provider Last Rate Last Dose  . iohexol (OMNIPAQUE) 300 MG/ML solution 80 mL  80 mL Intravenous Once PRN Medication Radiologist, MD   80 mL at 11/04/11 1428   Current Outpatient Prescriptions on File Prior to Encounter  Medication Sig Dispense Refill  . ciprofloxacin (CIPRO) 500 MG tablet Take 500 mg by mouth 2 (two) times daily.      . metroNIDAZOLE (FLAGYL) 500 MG tablet Take 500 mg by  mouth 3 (three) times daily.         Review Of Systems: Per HPI with the following additions: none Otherwise 12 point review of systems was performed and was unremarkable.  Physical Exam: Pulse: 120  Blood Pressure: 170/89 RR: 18   O2: 99% on RA Temp: 98.5  General: alert, cooperative, appears stated age and no distress HEENT: extra ocular movement intact, sclera clear, anicteric, oropharynx clear, no lesions and neck supple with midline trachea Heart: S1, S2 normal, no murmur, rub or gallop, regular rhythm, tachycardic Lungs: clear to auscultation, no wheezes or rales and unlabored breathing Abdomen: +BS, abdomen appears distended; firm, but compressible;  TTP in LLQ; no rebound or guarding Extremities: extremities normal, atraumatic, no cyanosis or edema Skin:no rashes Neurology: normal without focal findings, mental status, speech normal, alert and oriented x3 and PERLA  Labs and Imaging:  Lab 11/04/11 1115 11/01/11 0826  WBC 12.4* 5.0  HGB 10.7* 10.6*  HCT 34.2* 35.9*  PLT 381 395    Lab 11/04/11 1115 11/01/11 0826  NA 139 139  K 3.6 4.1  CL 101 108  CO2 26 23  BUN 8 13  CREATININE 0.83 0.80  CALCIUM 9.2 9.3  PROT -- 6.3  BILITOT -- 0.2*  ALKPHOS -- 64  ALT -- 17  AST -- 20  GLUCOSE 126* 97   CT Abd/pelvis 1. Significant progression of diverticulitis, now involving the  distal splenic flexure, entire descending colon, and sigmoid colon.  2. Small fluid collection lateral to the sigmoid colon may  represent small abscess as described.  3. No evidence for free intraperitoneal air.  Assessment and Plan: CHAYIM BIALAS is a 46 y.o. year old male with known diverticulosis and recent diverticulitis currently on cipro and flagyl presenting with worsening diverticulitis and possible abscess.  # Diverticulitis/Abscess: Recent admission for diverticulitis the end of March, sent home on cipro and flagyl, CT abd/pelvis showed worsening of diverticulitis and possible abscess.  White count elevated from office visit last week. - Cipro, flagyl, zosyn IV - zofran PO q8hr prn - Norco 5-325 q4hr prn - protonix 40mg  IV q24hr - Bowel regimen with colace and miralax, consider fleet enema if no improvement - will get hemoccult  - recheck CBC in am - will call surgeon tonight regarding CT abdomen results  # Anemia: Likely related to GI losses during last admission.  Hgb is improved from last admission and stable from last week. - recheck CBC in am - hemoccult stool  # Dysuria: Likely related to inflammation from diverticulitis. - will check a UA (expect pyuria given diverticulitis)  # Elevated BP: No known history of HTN, may be  secondary to pain given associated tachycardia. - will continue to monitor  # Tobacco Abuse: Has cut back to 1ppd from 3ppd - encouraged continued work toward cessation - will give nicotine patch  # Hx of heavy alcohol use: - patient has cut back since last hospitalized for diverticulitis - no signs or symptoms of alcohol W/D  FEN/GI: NPO except meds, 1/2NS + 20KCl at 125cc/hr Prophylaxis: SQ heparin, IV protonix Disposition: admit to inpatient  BOOTH, ERIN 11/04/2011, 7:42 PM  I have seen and examined Mr. Wedig with Dr. Elwyn Reach and I agree with her assessment/plan.  I have reviewed all available data and have made my additional changes to the above H&P.  DE LA CRUZ,Annaclaire Walsworth

## 2011-11-04 NOTE — Progress Notes (Signed)
ANTIBIOTIC CONSULT NOTE - INITIAL  Pharmacy Consult for Zosyn Indication: diverticulitis with possible abscess  Allergies  Allergen Reactions  . Darvocet (Propoxacet-N) Hives    Vital Signs: Temp: 98.5 F (36.9 C) (04/15 1819) Temp src: Oral (04/15 1052) BP: 170/89 mmHg (04/15 1819) Pulse Rate: 120  (04/15 1819) Intake/Output from previous day:   Intake/Output from this shift:    Labs:  Basename 11/04/11 1115  WBC 12.4*  HGB 10.7*  PLT 381  LABCREA --  CREATININE 0.83   The CrCl is unknown because both a height and weight (above a minimum accepted value) are required for this calculation. No results found for this basename: VANCOTROUGH:2,VANCOPEAK:2,VANCORANDOM:2,GENTTROUGH:2,GENTPEAK:2,GENTRANDOM:2,TOBRATROUGH:2,TOBRAPEAK:2,TOBRARND:2,AMIKACINPEAK:2,AMIKACINTROU:2,AMIKACIN:2, in the last 72 hours   Microbiology: No results found for this or any previous visit (from the past 720 hour(s)).  Medical History: Past Medical History  Diagnosis Date  . Diverticulitis   . Hypoglycemia   . GERD (gastroesophageal reflux disease)   . Diverticulosis     By colonoscopy June 2005  . Lower GI bleed     June 2005. Presumably secondary to diverticulosis.  . Active smoker   . Alcohol abuse     Medications:  Scheduled:    . ciprofloxacin  400 mg Intravenous Q12H  . docusate sodium  100 mg Oral BID  . heparin  5,000 Units Subcutaneous Q8H  . metronidazole  500 mg Intravenous Q8H  . nicotine  7 mg Transdermal Daily  . pantoprazole (PROTONIX) IV  40 mg Intravenous Q24H  . polyethylene glycol  17 g Oral Daily   Assessment: 46 y.o. year old male with known diverticulosis and recent diverticulitis, on PO cipro and flagyl prior to admission, presenting with worsening diverticulitis and possible abscess. Pharmacy is consulted to add zosyn for broader coverage. Pt. Is also continued on IV cipro and flagyl. Renal function stable from previous month, est. GFR > 90, afebrile, wbc  12.4  Plan:  - Start zosyn 3.375g IV Q 8hrs - f/u clinical course, f/u narrow antibiotics when pt. improves   Bayard Hugger, PharmD, BCPS, [phone]  11/04/2011,8:04 PM

## 2011-11-05 ENCOUNTER — Encounter (HOSPITAL_COMMUNITY): Payer: Self-pay | Admitting: *Deleted

## 2011-11-05 DIAGNOSIS — K5732 Diverticulitis of large intestine without perforation or abscess without bleeding: Secondary | ICD-10-CM

## 2011-11-05 LAB — BASIC METABOLIC PANEL
Calcium: 9.1 mg/dL (ref 8.4–10.5)
GFR calc Af Amer: >90 mL/min (ref >90)
GFR calc non Af Amer: >90 mL/min (ref >90)
Potassium: 4.2 mEq/L (ref 3.5–5.1)
Sodium: 137 mEq/L (ref 135–145)

## 2011-11-05 LAB — CBC
Hemoglobin: 10 g/dL — ABNORMAL LOW (ref 13.0–17.0)
MCH: 28.8 pg (ref 26.0–34.0)
Platelets: 302 10*3/uL (ref 150–400)
RBC: 3.47 MIL/uL — ABNORMAL LOW (ref 4.22–5.81)
WBC: 8.5 10*3/uL (ref 4.0–10.5)

## 2011-11-05 MED ORDER — BIOTENE DRY MOUTH MT LIQD
15.0000 mL | Freq: Two times a day (BID) | OROMUCOSAL | Status: DC
  Administered 2011-11-05: 15 mL via OROMUCOSAL

## 2011-11-05 MED ORDER — CHLORHEXIDINE GLUCONATE 0.12 % MT SOLN
15.0000 mL | Freq: Two times a day (BID) | OROMUCOSAL | Status: DC
  Administered 2011-11-05 (×2): 15 mL via OROMUCOSAL
  Filled 2011-11-05 (×2): qty 15

## 2011-11-05 NOTE — Progress Notes (Signed)
Family Medicine Teaching Service Attending Note  I discussed patient Siple  with Dr. Vic Ripper and reviewed their note for today.  I agree with their assessment and plan.

## 2011-11-05 NOTE — H&P (Signed)
I interviewed and examined this patient and discussed the care plan with Dr. Tye Savoy and the FPTS team and agree with assessment and plan as documented in the admission note for yesterday.  He is feeling better this AM, is hungry, has tenderness limited to the LLQ and has bowel sounds. I agree with adding the Zosyn and believe we can discontinue the Cipro. This surgery consultation note is pending.     Tahira Olivarez A. Sheffield Slider, MD Family Medicine Teaching Service Attending  11/05/2011 9:09 AM

## 2011-11-05 NOTE — Progress Notes (Signed)
Daily Progress Note Timothy [name]. [name], M.D., M.B.A  Family Medicine PGY-1 Pager [phone]  Subjective: Decreased pain; denies nausea, vomiting, and bowel movements; is passing flatus; is hungry   Objective: Vital signs in last 24 hours: Temp:  [98.5 F (36.9 C)-100.6 F (38.1 C)] 100.6 F (38.1 C) (04/15 2120) Pulse Rate:  [101-120] 101  (04/15 2120) Resp:  [18] 18  (04/15 2120) BP: (140-170)/(79-95) 140/79 mmHg (04/15 2120) SpO2:  [97 %-99 %] 97 % (04/15 2120) Weight:  [186 lb 14.4 oz (84.777 kg)] 186 lb 14.4 oz (84.777 kg) (04/15 1052) Weight change:  Last BM Date: 11/04/11  Intake/Output from previous day: 04/15 0701 - 04/16 0700 In: 0  Out: 900 [Urine:900] Intake/Output this shift: Total I/O In: -  Out: 600 [Urine:600]  Gen: awake, alert, non-distressed Cardiac: RRR, no murmurs Lungs: CTA-B Abdomen: non-distended, non-tender; hypoactive bowel sounds Extremities: no swelling or tenderness   Lab Results:  [facility] 11/04/11 2002 11/04/11 1115  WBC 12.3* 12.4*  HGB 10.3* 10.7*  HCT 32.2* 34.2*  PLT 317 381   BMET  Basename 11/04/11 2002 11/04/11 1115  NA -- 139  K -- 3.6  CL -- 101  CO2 -- 26  GLUCOSE -- 126*  BUN -- 8  CREATININE 0.80 0.83  CALCIUM -- 9.2    Studies/Results: CT Abdomen: IMPRESSION:  1.  Significant progression of diverticulitis, now involving the distal splenic flexure, entire descending colon, and sigmoid colon. 2.  Small fluid collection lateral to the sigmoid colon may represent small abscess as described. 3.  No evidence for free intraperitoneal air.  Original Report Authenticated By: Patterson Hammersmith, M.D.    Medications: I have reviewed the patient's current medications.  Assessment/Plan: 46 year old gentleman with worsening diverticulitis.   1. GI - Diverticulitis worsening from distal splenic flexure through sigmoid colon with abscess  - started on Zosyn (4/15) in addition to Cipro and Flagly which patient has been on  since March 26  - d/c cipro  - f/u surgery rec  - keep NPO with MIVF   - norco &  dilaudid for pain, consider addition of mesalamine; zofran for nausea     2. Anemia - Hb stable; continue to trend   3. Dispo: remain inpatient for IV antibiotics and conservative mgt    LOS: 1 day   Mat Carne 11/05/2011, 3:47 AM

## 2011-11-05 NOTE — Consult Note (Signed)
Timothy [name] Nov 21, 1965  [id number].   Primary Care MD: Dr. Delbert Harness Requesting MD: Dr. Deirdre Priest Chief Complaint/Reason for Consult: diverticulitis HPI: This is a 46 yo male who was dx with diverticulosis in 2005.  Just 2 weeks ago be began having BRBPR, which stopped prior to admission, but was also found to have active diverticulitis.  He was admitted and treated.  Just a day prior to this admission the patient states he had minimal pain.  Just a feeling like he was constipated with a few sharp pains.  He does admit to fevers and chills at home.  He came to the hospital due to this feeling and was found to have progressive worsening of his diverticulitis involving most of his left colon extending from splenic flexure to sigmoid colon.  He was also noted to have a small fluid collection in his pelvis that is 2x1 cm.  He was admitted and placed on IV Zosyn.  We have been asked to see.  Review of Systems: Please see HPI, otherwise all other systems are negative.  Family History  Problem Relation Age of Onset  . Diabetes Mother   . Parkinsonism Mother   . Depression Mother   . Alcohol abuse Father   . Depression Father   . Hypertension Father   . Cancer Maternal Aunt     breast  . Stroke Neg Hx   . Heart disease Neg Hx     Past Medical History  Diagnosis Date  . Diverticulitis   . Hypoglycemia   . GERD (gastroesophageal reflux disease)   . Diverticulosis     By colonoscopy June 2005  . Lower GI bleed     June 2005. Presumably secondary to diverticulosis.  . Active smoker   . Alcohol abuse     Past Surgical History  Procedure Date  . Appendectomy     Social History:  reports that he has been smoking Cigarettes.  He has been smoking about 3 packs per day. He has never used smokeless tobacco. He reports that he drinks alcohol. He reports that he does not use illicit drugs. He states that he has decreased his smoking to 1 pack a day. Allergies:  Allergies  Allergen  Reactions  . Darvocet (Propoxacet-N) Hives    Medications Prior to Admission  Medication Dose Route Frequency Provider Last Rate Last Dose  . 0.45 % NaCl with KCl 20 mEq / L infusion   Intravenous Continuous Phebe Colla, MD 125 mL/hr at 11/05/11 0529    . acetaminophen (TYLENOL) tablet 650 mg  650 mg Oral Q6H PRN Phebe Colla, MD       Or  . acetaminophen (TYLENOL) suppository 650 mg  650 mg Rectal Q6H PRN Phebe Colla, MD      . antiseptic oral rinse (BIOTENE) solution 15 mL  15 mL Mouth Rinse q12n4p Simbiso Ranga, MD      . chlorhexidine (PERIDEX) 0.12 % solution 15 mL  15 mL Mouth Rinse BID Simbiso Ranga, MD   15 mL at 11/05/11 0754  . diphenhydrAMINE (BENADRYL) capsule 25 mg  25 mg Oral Q4H PRN Phebe Colla, MD   25 mg at 11/04/11 2146  . docusate sodium (COLACE) capsule 100 mg  100 mg Oral BID Phebe Colla, MD      . heparin injection 5,000 Units  5,000 Units Subcutaneous Q8H Phebe Colla, MD   5,000 Units at 11/05/11 0700  . HYDROcodone-acetaminophen (NORCO) 5-325 MG per tablet 1  tablet  1 tablet Oral Q4H PRN Phebe Colla, MD      . HYDROmorphone (DILAUDID) injection 0.5 mg  0.5 mg Intravenous Q2H PRN Phebe Colla, MD   0.5 mg at 11/04/11 2146  . iohexol (OMNIPAQUE) 300 MG/ML solution 80 mL  80 mL Intravenous Once PRN Medication Radiologist, MD   80 mL at 11/04/11 1428  . metroNIDAZOLE (FLAGYL) IVPB 500 mg  500 mg Intravenous Q8H Phebe Colla, MD   500 mg at 11/05/11 0431  . nicotine (NICODERM CQ - dosed in mg/24 hr) patch 7 mg  7 mg Transdermal Daily Phebe Colla, MD   7 mg at 11/04/11 2331  . ondansetron (ZOFRAN) tablet 4 mg  4 mg Oral Q8H PRN Phebe Colla, MD      . pantoprazole (PROTONIX) injection 40 mg  40 mg Intravenous Q24H Phebe Colla, MD   40 mg at 11/04/11 2343  . piperacillin-tazobactam (ZOSYN) IVPB 3.375 g  3.375 g Intravenous Q8H Carney Living, MD   3.375 g at 11/05/11 0528  . polyethylene glycol (MIRALAX / GLYCOLAX) packet 17 g  17 g Oral Daily Phebe Colla, MD       . DISCONTD: ciprofloxacin (CIPRO) IVPB 400 mg  400 mg Intravenous Q12H Phebe Colla, MD   400 mg at 11/04/11 2050   Medications Prior to Admission  Medication Sig Dispense Refill  . ciprofloxacin (CIPRO) 500 MG tablet Take 500 mg by mouth 2 (two) times daily.      . metroNIDAZOLE (FLAGYL) 500 MG tablet Take 500 mg by mouth 3 (three) times daily.        Blood pressure 118/66, pulse 86, temperature 98.1 F (36.7 C), temperature source Oral, resp. rate 18, height 5\' 10"  (1.778 m), weight 186 lb 14.1 oz (84.77 kg), SpO2 98.00%. Physical Exam: General: pleasant, WD, WN white male who is laying in bed in NAD HEENT: head is normocephalic, atraumatic.  Sclera are noninjected.  PERRL.  Ears and nose without any masses or lesions.  Mouth is pink and moist Heart: regular, rate, and rhythm.  Normal s1,s2. No obvious murmurs, gallops, or rubs noted.  Palpable radial and pedal pulses bilaterally Lungs: CTAB, no wheezes, rhonchi, or rales noted.  Respiratory effort nonlabored Abd: soft, minimally tender in LLQ, otherwise no abdominal tenderness, ND, +BS, no masses, hernias, or organomegaly. MS: all 4 extremities are symmetrical with no cyanosis, clubbing, or edema. Skin: warm and dry with no masses, lesions, or rashes Psych: A&Ox3 with an appropriate affect.    Results for orders placed during the hospital encounter of 11/04/11 (from the past 48 hour(s))  URINALYSIS, ROUTINE W REFLEX MICROSCOPIC     Status: Abnormal   Collection Time   11/04/11  7:54 PM      Component Value Range Comment   Color, Urine YELLOW  YELLOW     APPearance CLEAR  CLEAR     Specific Gravity, Urine 1.008  1.005 - 1.030     pH 6.5  5.0 - 8.0     Glucose, UA NEGATIVE  NEGATIVE (mg/dL)    Hgb urine dipstick TRACE (*) NEGATIVE     Bilirubin Urine NEGATIVE  NEGATIVE     Ketones, ur NEGATIVE  NEGATIVE (mg/dL)    Protein, ur NEGATIVE  NEGATIVE (mg/dL)    Urobilinogen, UA 0.2  0.0 - 1.0 (mg/dL)    Nitrite NEGATIVE  NEGATIVE       Leukocytes, UA NEGATIVE  NEGATIVE  URINE MICROSCOPIC-ADD ON     Status: Normal   Collection Time   11/04/11  7:54 PM      Component Value Range Comment   RBC / HPF 0-2  <3 (RBC/hpf)   CBC     Status: Abnormal   Collection Time   11/04/11  8:02 PM      Component Value Range Comment   WBC 12.3 (*) 4.0 - 10.5 (K/uL)    RBC 3.56 (*) 4.22 - 5.81 (MIL/uL)    Hemoglobin 10.3 (*) 13.0 - 17.0 (g/dL)    HCT 16.1 (*) 09.6 - 52.0 (%)    MCV 90.4  78.0 - 100.0 (fL)    MCH 28.9  26.0 - 34.0 (pg)    MCHC 32.0  30.0 - 36.0 (g/dL)    RDW 04.5  40.9 - 81.1 (%)    Platelets 317  150 - 400 (K/uL)   CREATININE, SERUM     Status: Normal   Collection Time   11/04/11  8:02 PM      Component Value Range Comment   Creatinine, Ser 0.80  0.50 - 1.35 (mg/dL)    GFR calc non Af Amer >90  >90 (mL/min)    GFR calc Af Amer >90  >90 (mL/min)   BASIC METABOLIC PANEL     Status: Normal   Collection Time   11/05/11  5:03 AM      Component Value Range Comment   Sodium 137  135 - 145 (mEq/L)    Potassium 4.2  3.5 - 5.1 (mEq/L)    Chloride 102  96 - 112 (mEq/L)    CO2 25  19 - 32 (mEq/L)    Glucose, Bld 93  70 - 99 (mg/dL)    BUN 6  6 - 23 (mg/dL)    Creatinine, Ser 9.14  0.50 - 1.35 (mg/dL)    Calcium 9.1  8.4 - 10.5 (mg/dL)    GFR calc non Af Amer >90  >90 (mL/min)    GFR calc Af Amer >90  >90 (mL/min)   CBC     Status: Abnormal   Collection Time   11/05/11  5:03 AM      Component Value Range Comment   WBC 8.5  4.0 - 10.5 (K/uL)    RBC 3.47 (*) 4.22 - 5.81 (MIL/uL)    Hemoglobin 10.0 (*) 13.0 - 17.0 (g/dL)    HCT 78.2 (*) 95.6 - 52.0 (%)    MCV 91.9  78.0 - 100.0 (fL)    MCH 28.8  26.0 - 34.0 (pg)    MCHC 31.3  30.0 - 36.0 (g/dL)    RDW 21.3  08.6 - 57.8 (%)    Platelets 302  150 - 400 (K/uL)    Ct Abdomen Pelvis W Contrast  11/04/2011  *RADIOLOGY REPORT*  Clinical Data: Recent diverticulitis.  Rule out abscess, obstruction.  The patient has been on antibiotics for 3 weeks. Left lower quadrant pain,  tenderness.  Rule out abscess.  CT ABDOMEN AND PELVIS WITH CONTRAST  Technique:  Multidetector CT imaging of the abdomen and pelvis was performed following the standard protocol during bolus administration of intravenous contrast.  Contrast: 80mL OMNIPAQUE IOHEXOL 300 MG/ML  SOLN  Comparison: 10/15/2011  Findings: Images of the lung bases show no nodules, pleural effusions, or infiltrates.  Heart size is normal.  No focal abnormality identified within the liver, spleen, pancreas, adrenal glands, or kidneys.  The gallbladder is present.  There has been progression of inflammatory change surrounding the  colon.  Inflammatory changes extend from the splenic flexure through the descending colon to involve the entire sigmoid colon. Numerous diverticula are identified along this segment. There is significant thickening of the wall of the descending colon.  The most involved portion is the sigmoid colon where there is significant stranding in the sigmoid mesentery.  A small collection of fluid is identified lateral to the sigmoid colon along the pelvic side wall, measuring 2.1 x 1.1 cm on image 67 of series 2. No extraluminal gas is identified to suggest micro perforation or free intraperitoneal air.  There are scattered atherosclerotic calcifications of the abdominal aorta.  No aneurysm. No retroperitoneal or mesenteric adenopathy. Visualized osseous structures have a normal appearance.  IMPRESSION:  1.  Significant progression of diverticulitis, now involving the distal splenic flexure, entire descending colon, and sigmoid colon. 2.  Small fluid collection lateral to the sigmoid colon may represent small abscess as described. 3.  No evidence for free intraperitoneal air.  Original Report Authenticated By: Patterson Hammersmith, M.D.       Assessment/Plan 1. Recurrent diverticulitis, now with microperforation and small fluid collection 2. ETOH abuse 3. Tobacco abuse  Plan: 1. The patient appears better than his CT  scan does.  He has extensive inflammation extending from splenic flexure to sigmoid colon.  He has a small fluid collection that does not appear to be large enough for a percutaneous drain to be placed.  Would recommend NPO today for at least 24 hours worth of bowel rest.  If his pain is better then he could likely have clears tomorrow.  We discussed the possible need for surgery if he acutely worsens.  We also discussed the need for probably elective resection in the future if he can get better this admission without needing surgery.  Agree with Zosyn.  We will follow along with you.  Thank you for this consultation.  Daana Petrasek E 11/05/2011, 8:37 AM

## 2011-11-06 MED ORDER — METRONIDAZOLE 500 MG PO TABS: 500.0000 mg | ORAL_TABLET | Freq: Three times a day (TID) | ORAL | Status: DC

## 2011-11-06 MED ORDER — AMOXICILLIN-POT CLAVULANATE ER 1000-62.5 MG PO TB12: 2.0000 | ORAL_TABLET | Freq: Two times a day (BID) | ORAL | Status: DC

## 2011-11-06 NOTE — Progress Notes (Signed)
Family Medicine Teaching Service Attending Note  I interviewed and examined patient Timothy [name] and reviewed their tests and x-rays.  I discussed with Dr. [name] and reviewed their note for today.  I agree with their assessment and plan.     Additionally  Much improved - no pain Ok to discharge emphasize needs to take all antibiotics as precribed

## 2011-11-06 NOTE — Discharge Summary (Signed)
Physician Discharge Summary  Patient ID: Timothy [name] MRN: [id number] DOB/AGE: 44-17-1967 44 y.o.  Admit date: 11/04/2011 Discharge date: 11/06/2011  Admission Diagnoses: Diverticulitis  Discharge Diagnoses:  Active Problems:  Diverticulitis   Discharged Condition: good  Hospital Course: [name] was admitted with worsening diverticulitis after being hospitalized for the same problem October 15, 2011. His previous outpatient treatment was PO Cipro and Flagy. Once admitted, he was treated with IV Zosyn and made NPO. He was also evaluated by surgery who proposed medical management as well. After 24 hours of NPO, his diet advanced to clears which he tolerated. He was afebrile, normal WBC, and pain-free on a low fiber diet at the time of discharge. He was discharged with a 2 week course of PO Augmentin and Flagyl. He will f/u with Dr. Carolynne Edouard as an outpatient.   Consults: general surgery  Significant Diagnostic Studies:  Fecal Occult Negative  *RADIOLOGY REPORT*  CT Abdomen  IMPRESSION:  1. Significant progression of diverticulitis, now involving the  distal splenic flexure, entire descending colon, and sigmoid colon.  2. Small fluid collection lateral to the sigmoid colon may  represent small abscess as described.  3. No evidence for free intraperitoneal air.  Original Report Authenticated By: Patterson Hammersmith, M.D.   Treatments: IV hydration, antibiotics: Zosyn and analgesia: Dilaudid  Discharge Exam: Blood pressure 135/82, pulse 93, temperature 97.9 F (36.6 C), temperature source Oral, resp. rate 20, height 5\' 10"  (1.778 m), weight 186 lb 14.1 oz (84.77 kg), SpO2 98.00%. Gen: awake, alert, non-distressed  Cardiac: RRR, no murmurs  Lungs: CTA-B  Abdomen: non-distended, non-tender; + bowel sounds  Extremities: no swelling or tenderness   Disposition: 01-Home or Self Care  Discharge Orders    Future Appointments: Provider: Department: Dept Phone: Center:   12/03/2011  10:10 AM Caleen Essex III, MD [facility] [phone] None     Future Orders Please Complete By Expires   Increase activity slowly        Medication List  As of 11/06/2011  2:51 PM   STOP taking these medications         ciprofloxacin 500 MG tablet         TAKE these medications         amoxicillin-clavulanate 1000-62.5 MG per tablet   Commonly known as: AUGMENTIN XR   Take 2 tablets by mouth 2 (two) times daily.      HYDROcodone-acetaminophen 5-500 MG per tablet   Commonly known as: VICODIN   Take 1 tablet by mouth every 8 (eight) hours as needed. For pain      metroNIDAZOLE 500 MG tablet   Commonly known as: FLAGYL   Take 1 tablet (500 mg total) by mouth 3 (three) times daily.      ondansetron 4 MG tablet   Commonly known as: ZOFRAN   Take 4 mg by mouth every 8 (eight) hours as needed. For nausea           Follow-up Information    Follow up with Robyne Askew, MD on 12/03/2011. (Appt @ 9:45 AM)    Contact information:   [facility] [address] [place] [address] [phone]       Follow up with Delbert Harness, MD. (As needed)    Contact information:   [address] [place] [address] [phone]          Signed: Mat Carne 11/06/2011, 2:51 PM

## 2011-11-06 NOTE — Discharge Instructions (Signed)
You were hospitalized with concern for worsening diverticulitis. Thankfully you responded very well to IV Zosyn and are tolerating food well.   Medications: At discharge you will take 2 antibiotics: Augmentin and Flagyl. Augmentin - take 2 pills 2 x a day for 14 days. Flagyl (which you were already taking) - take 1 pill 3 x a day for 14 days. If you are still having symptoms after 14 days, then continue these medication until you can see Dr. Carolynne Edouard in clinic. These medications have been sent to the [facility] in [place]. IMPORTANT - DO NOT drink alcohol while taking flagyl. It can make you very sick.   Diet: You should eat a low fiber diet for the next week. Please read below for information about that diet.   Follow-up: Appointment with Dr. Carolynne Edouard on 5/14. If you have having any problems, then don't hesitate to call Dr. Earnest Bailey for an appointment.     Low Fiber and Residue Restricted Diet A low fiber diet restricts foods that contain carbohydrates that are not digested in the small intestine. A diet containing about 10 g of fiber is considered low fiber. The diet needs to be individualized to suit patient tolerances and preferences and to avoid unnecessary restrictions. Generally, the foods emphasized in a low fiber diet have no skins or seeds. They may have been processed to remove bran, germ, or husks. Cooking may not necessarily eliminate the fiber. Cooking may, in fact, enable a greater quantity of fiber to be consumed in a lesser volume. Legumes and nuts are also restricted. The term low residue has also been used to describe low fiber diets, although the two are not the same. Residue refers to any substance that adds to bowel (colonic) contents, such as sloughed cells and intestinal bacteria, in addition to fiber. Residue-containing foods, prunes and prune juice, milk, and connective tissue from meats may also need to be eliminated. It is important to eliminate these foods during sudden (acute)  attacks of inflammatory bowel disease, when there is a partial obstruction due to another reason, or when minimal fecal output is desired. When these problems are gone, a more normal diet may be used. PURPOSE  Prevent blockage of a partially obstructed or narrowed gastrointestinal tract.   Reduce stool weight and volume.   Slow the movement of waste.  WHEN IS THIS DIET USED?  Acute phase of Crohn's disease, ulcerative colitis, regional enteritis, or diverticulitis.   Narrowing (stenosis) of intestinal or esophageal tubes (lumina).   Transitional diet following surgery, injury (trauma), or illness.  ADEQUACY This diet is nutritionally adequate based on individual food choices according to the Recommended Dietary Allowances of the [employer]. CHOOSING FOODS Check labels, especially on foods from the starch list. Often, dietary fiber content is listed with the Nutrition Facts panel.  Breads and Starches  Allowed: White, [place], and pita breads, plain rolls, buns, or sweet rolls, doughnuts, waffles, pancakes, bagels. Plain muffins, sweet breads, biscuits, matzoth. Flour. Soda, saltine, or graham crackers. Pretzels, rusks, melba toast, zwieback. Cooked cereals: cornmeal, farina, cream cereals. Dry cereals: refined corn, wheat, rice, and oat cereals (check label). Potatoes prepared any way without skins, refined macaroni, spaghetti, noodles, refined rice.   Avoid: Bread, rolls, or crackers made with whole-wheat, multigrains, rye, bran seeds, nuts, or coconut. Corn tortillas, table-shells. Corn chips, tortilla chips. Cereals containing whole-grains, multigrains, bran, coconut, nuts, or raisins. Cooked or dry oatmeal. Coarse wheat cereals, granola. Cereals advertised as "high fiber." Potato skins. Whole-grain pasta, wild or  brown rice. Popcorn.  Vegetables  Allowed:  Strained tomato and vegetable juices. Fresh: tender lettuce, cucumber, cabbage, spinach, bean sprouts. Cooked, canned:  asparagus, bean sprouts, cut green or wax beans, cauliflower, pumpkin, beets, mushrooms, olives, spinach, yellow squash, tomato, tomato sauce (no seeds), zucchini (peeled), turnips. Canned sweet potatoes. Small amounts of celery, onion, radish, and green pepper may be used. Keep servings limited to  cup.   Avoid: Fresh, cooked, or canned: artichokes, baked beans, beet greens, broccoli, Brussels sprouts, French-style green beans, corn, kale, legumes, peas, sweet potatoes. Cooked: green or red cabbage, spinach. Avoid large servings of any vegetables.  Fruit  Allowed:  All fruit juices except prune juice. Cooked or canned: apricots applesauce, cantaloupe, cherries, grapefruit, grapes, kiwi, mandarin oranges, peaches, pears, fruit cocktail, pineapple, plums, watermelon. Fresh: banana, grapes, cantaloupe, avocado, cherries, pineapple, grapefruit, kiwi, nectarines, peaches, oranges, blueberries, plums. Keep servings limited to  cup or 1 piece.   Avoid: Fresh: apple with or without skin, apricots, mango, pears, raspberries, strawberries. Prune juice, stewed or dried prunes. Dried fruits, raisins, dates. Avoid large servings of all fresh fruits.  Meat and Meat Substitutes  Allowed:  Ground or well-cooked tender beef, ham, veal, lamb, pork, or poultry. Eggs, plain cheese. Fish, oysters, shrimp, lobster, other seafood. Liver, organ meats.   Avoid: Tough, fibrous meats with gristle. Peanut butter, smooth or chunky. Cheese with seeds, nuts, or other foods not allowed. Nuts, seeds, legumes, dried peas, beans, lentils.  Milk  Allowed:  All milk products except those not allowed. Milk and milk product consumption should be minimal when low residue is desired.   Avoid: Yogurt that contains nuts or seeds.  Soups and Combination Foods  Allowed:  Bouillon, broth, or cream soups made from allowed foods. Any strained soup. Casseroles or mixed dishes made with allowed foods.   Avoid: Soups made from vegetables  that are not allowed or that contain other foods not allowed.  Desserts and Sweets  Allowed:  Plain cakes and cookies, pie made with allowed fruit, pudding, custard, cream pie. Gelatin, fruit, ice, sherbet, frozen ice pops. Ice cream, ice milk without nuts. Plain hard candy, honey, jelly, molasses, syrup, sugar, chocolate syrup, gumdrops, marshmallows.   Avoid: Desserts, cookies, or candies that contain nuts, peanut butter, or dried fruits. Jams, preserves with seeds, marmalade.  Fats and Oils  Allowed:  Margarine, butter, cream, mayonnaise, salad oils, plain salad dressings made from allowed foods. Plain gravy, crisp bacon without rind.   Avoid: Seeds, nuts, olives. Avocados.  Beverages  Allowed:  All, except those listed to avoid.   Avoid: Fruit juices with high pulp, prune juice.  Condiments  Allowed:  Ketchup, mustard, horseradish, vinegar, cream sauce, cheese sauce, cocoa powder. Spices in moderation: allspice, basil, bay leaves, celery powder or leaves, cinnamon, cumin powder, curry powder, ginger, mace, marjoram, onion or garlic powder, oregano, paprika, parsley flakes, ground pepper, rosemary, sage, savory, tarragon, thyme, turmeric.   Avoid: Coconut, pickles.  SAMPLE MEAL PLAN The following menu is provided as a sample. Your daily menu plans will vary. Be sure to include a minimum of the following each day in order to provide essential nutrients for the adult:  Starch/Bread/Cereal Group, 6 servings.   Fruit/Vegetable Group, 5 servings.   Meat/Meat Substitute Group, 2 servings.   Milk/Milk Substitute Group, 2 servings.  A serving is equal to  cup for fruits, vegetables, and cooked cereals or 1 piece for foods such as a piece of bread, 1 orange, or 1 apple. For dry cereals  and crackers, use serving sizes listed on the label. Combination foods may count as full or partial servings from various food groups. Fats, desserts, and sweets may be added to the meal plan after the  requirements for essential nutrients are met. SAMPLE MENU Breakfast   cup orange juice.   1 boiled egg.   1 slice white toast.   Margarine.    cup cornflakes.   1 cup milk.   Beverage.  Lunch   cup chicken noodle soup.   2 to 3 oz sliced roast beef.   2 slices seedless rye bread.   Mayonnaise.    cup tomato juice.   1 small banana.   Beverage.  Dinner  3 oz baked chicken.    cup scalloped potatoes.    cup cooked beets.   White dinner roll.   Margarine.    cup canned peaches.   Beverage.  Document Released: 12/28/2001 Document Revised: 06/27/2011 Document Reviewed: 06/10/2011 [facility] Patient Information 2012 [place], [place].

## 2011-11-06 NOTE — Progress Notes (Signed)
Daily Progress Note Timothy [name]. Timothy [name], M.D., M.B.A  Family Medicine PGY-1 Pager [phone]  Subjective: Denies pain/nausea/vomiting, wants to eat, 1 BM yesterday that was non-bloody  Objective: Vital signs in last 24 hours: Temp:  [97.8 F (36.6 C)-99.7 F (37.6 C)] 97.8 F (36.6 C) (04/17 0545) Pulse Rate:  [86-95] 95  (04/17 0545) Resp:  [18-20] 20  (04/17 0545) BP: (116-122)/(66-69) 116/66 mmHg (04/17 0545) SpO2:  [95 %-100 %] 95 % (04/17 0545) Weight change:  Last BM Date: 11/05/11  Intake/Output from previous day: 04/16 0701 - 04/17 0700 In: 4846 [P.O.:960; I.V.:3736; IV Piggyback:150] Out: 4275 [Urine:4275] Intake/Output this shift:    Gen: awake, alert, non-distressed Cardiac: RRR, no murmurs Lungs: CTA-B Abdomen: non-distended, non-tender; + bowel sounds Extremities: no swelling or tenderness   Lab Results:  Basename 11/05/11 0503 11/04/11 2002  WBC 8.5 12.3*  HGB 10.0* 10.3*  HCT 31.9* 32.2*  PLT 302 317   BMET  Basename 11/05/11 0503 11/04/11 2002 11/04/11 1115  NA 137 -- 139  K 4.2 -- 3.6  CL 102 -- 101  CO2 25 -- 26  GLUCOSE 93 -- 126*  BUN 6 -- 8  CREATININE 0.81 0.80 --  CALCIUM 9.1 -- 9.2    Studies/Results: CT Abdomen: IMPRESSION:  1.  Significant progression of diverticulitis, now involving the distal splenic flexure, entire descending colon, and sigmoid colon. 2.  Small fluid collection lateral to the sigmoid colon may represent small abscess as described. 3.  No evidence for free intraperitoneal air.  Original Report Authenticated By: Patterson Hammersmith, M.D.   Fecal Occult Negative   Medications: I have reviewed the patient's current medications.  Assessment/Plan: 46 year old gentleman who presented with diverticulitis of the colon, who has improved tremendously on 2 days of zosyn and bowel rest.   1. GI - Diverticulitis worsening from distal splenic flexure through sigmoid colon with abscess  - continue Zosyn (4/15)  - patient  doing well enough to advanace diet; if tolerated diet after lunch then d/c with f/u at gen surg; home with Augmentin, Flagyl per surgery rec   2. Anemia - Hb stable; continue to trend   3. Dispo: likely d/c today with PO antibiotics    LOS: 2 days   Mat Carne 11/06/2011, 10:19 AM

## 2011-11-06 NOTE — Progress Notes (Signed)
Patient ID: CID AGENA, male   DOB: 08-29-1965, 46 y.o.   MRN: [id number]    Subjective: Pt feels great.  Tolerated clear liquids yesterday.  No pain today.  Objective: Vital signs in last 24 hours: Temp:  [97.8 F (36.6 C)-99.7 F (37.6 C)] 97.8 F (36.6 C) (04/17 0545) Pulse Rate:  [86-95] 95  (04/17 0545) Resp:  [18-20] 20  (04/17 0545) BP: (116-122)/(66-69) 116/66 mmHg (04/17 0545) SpO2:  [95 %-100 %] 95 % (04/17 0545) Last BM Date: 11/05/11  Intake/Output from previous day: 04/16 0701 - 04/17 0700 In: 4846 [P.O.:960; I.V.:3736; IV Piggyback:150] Out: 4275 [Urine:4275] Intake/Output this shift:    PE: Abd: soft, NT, ND, +BS  Lab Results:   Basename 11/05/11 0503 11/04/11 2002  WBC 8.5 12.3*  HGB 10.0* 10.3*  HCT 31.9* 32.2*  PLT 302 317   BMET  Basename 11/05/11 0503 11/04/11 2002 11/04/11 1115  NA 137 -- 139  K 4.2 -- 3.6  CL 102 -- 101  CO2 25 -- 26  GLUCOSE 93 -- 126*  BUN 6 -- 8  CREATININE 0.81 0.80 --  CALCIUM 9.1 -- 9.2   PT/INR No results found for this basename: LABPROT:2,INR:2 in the last 72 hours CMP     Component Value Date/Time   NA 137 11/05/2011 0503   K 4.2 11/05/2011 0503   CL 102 11/05/2011 0503   CO2 25 11/05/2011 0503   GLUCOSE 93 11/05/2011 0503   BUN 6 11/05/2011 0503   CREATININE 0.81 11/05/2011 0503   CREATININE 0.83 11/04/2011 1115   CALCIUM 9.1 11/05/2011 0503   PROT 6.3 11/01/2011 0826   ALBUMIN 4.0 11/01/2011 0826   AST 20 11/01/2011 0826   ALT 17 11/01/2011 0826   ALKPHOS 64 11/01/2011 0826   BILITOT 0.2* 11/01/2011 0826   GFRNONAA >90 11/05/2011 0503   GFRAA >90 11/05/2011 0503   Lipase     Component Value Date/Time   LIPASE 40 10/15/2011 1030       Studies/Results: Ct Abdomen Pelvis W Contrast  11/04/2011  *RADIOLOGY REPORT*  Clinical Data: Recent diverticulitis.  Rule out abscess, obstruction.  The patient has been on antibiotics for 3 weeks. Left lower quadrant pain, tenderness.  Rule out abscess.  CT ABDOMEN AND  PELVIS WITH CONTRAST  Technique:  Multidetector CT imaging of the abdomen and pelvis was performed following the standard protocol during bolus administration of intravenous contrast.  Contrast: 80mL OMNIPAQUE IOHEXOL 300 MG/ML  SOLN  Comparison: 10/15/2011  Findings: Images of the lung bases show no nodules, pleural effusions, or infiltrates.  Heart size is normal.  No focal abnormality identified within the liver, spleen, pancreas, adrenal glands, or kidneys.  The gallbladder is present.  There has been progression of inflammatory change surrounding the colon.  Inflammatory changes extend from the splenic flexure through the descending colon to involve the entire sigmoid colon. Numerous diverticula are identified along this segment. There is significant thickening of the wall of the descending colon.  The most involved portion is the sigmoid colon where there is significant stranding in the sigmoid mesentery.  A small collection of fluid is identified lateral to the sigmoid colon along the pelvic side wall, measuring 2.1 x 1.1 cm on image 67 of series 2. No extraluminal gas is identified to suggest micro perforation or free intraperitoneal air.  There are scattered atherosclerotic calcifications of the abdominal aorta.  No aneurysm. No retroperitoneal or mesenteric adenopathy. Visualized osseous structures have a normal appearance.  IMPRESSION:  1.  Significant progression of diverticulitis, now involving the distal splenic flexure, entire descending colon, and sigmoid colon. 2.  Small fluid collection lateral to the sigmoid colon may represent small abscess as described. 3.  No evidence for free intraperitoneal air.  Original Report Authenticated By: Patterson Hammersmith, M.D.    Anti-infectives: Anti-infectives     Start     Dose/Rate Route Frequency Ordered Stop   11/04/11 2100  piperacillin-tazobactam (ZOSYN) IVPB 3.375 g       3.375 g 12.5 mL/hr over 240 Minutes Intravenous Every 8 hours 11/04/11 2014      11/04/11 1930   ciprofloxacin (CIPRO) IVPB 400 mg  Status:  Discontinued        400 mg 200 mL/hr over 60 Minutes Intravenous Every 12 hours 11/04/11 1928 11/04/11 2219   11/04/11 1930   metroNIDAZOLE (FLAGYL) IVPB 500 mg  Status:  Discontinued        500 mg 100 mL/hr over 60 Minutes Intravenous Every 8 hours 11/04/11 1928 11/06/11 0730           Assessment/Plan  1. Diverticulitis with small fluid collection  Plan: 1. May advance to a low fat diet.  Recommend this for 1 week and then switch to high fiber diet. 2. Recommend Augmentin flagyl at home since he failed cipro flagyl at home this last time.  rec 2 weeks with a refill. 3. Follow up with Dr. Carolynne Edouard in 2-3 weeks.   LOS: 2 days    Chena Chohan E 11/06/2011

## 2011-11-06 NOTE — Progress Notes (Signed)
DC home with wife, Verbally understood DC instructions, No questions  Asked.

## 2011-11-07 ENCOUNTER — Ambulatory Visit: Payer: Self-pay | Admitting: Family Medicine

## 2011-11-07 NOTE — Discharge Summary (Signed)
I have reviewed this discharge summary and agree.    

## 2011-11-13 ENCOUNTER — Other Ambulatory Visit: Payer: Self-pay | Admitting: Family Medicine

## 2011-11-13 DIAGNOSIS — K5732 Diverticulitis of large intestine without perforation or abscess without bleeding: Secondary | ICD-10-CM

## 2011-11-13 MED ORDER — AMOXICILLIN-POT CLAVULANATE 875-125 MG PO TABS: 1.0000 | ORAL_TABLET | Freq: Three times a day (TID) | ORAL | Status: DC

## 2011-11-13 MED ORDER — METRONIDAZOLE 500 MG PO TABS: 500.0000 mg | ORAL_TABLET | Freq: Three times a day (TID) | ORAL | Status: AC

## 2011-11-13 NOTE — Telephone Encounter (Signed)
Will forward message to  Dr. [name]. Needs Rx sent to [facility] pharmacy.

## 2011-11-13 NOTE — Telephone Encounter (Signed)
Thank you for letting me know. I have printed and signed the prescriptions for Flagyl and Augmentin. Please fax them to the pharmacy at [facility].

## 2011-11-13 NOTE — Telephone Encounter (Signed)
Cannot afford augmentin 1000 and the pharm was able to give them 20 and can't get any more - OP pharm can give them 875mg  not XR but needs a new script for this.   They now have the orange card and needs to know what to do.  He is supposed to be on the meds for 4 weeks.

## 2011-11-14 ENCOUNTER — Other Ambulatory Visit: Payer: Self-pay | Admitting: Family Medicine

## 2011-11-14 ENCOUNTER — Telehealth: Payer: Self-pay | Admitting: Family Medicine

## 2011-11-14 DIAGNOSIS — K5732 Diverticulitis of large intestine without perforation or abscess without bleeding: Secondary | ICD-10-CM

## 2011-11-14 MED ORDER — AMOXICILLIN-POT CLAVULANATE 875-125 MG PO TABS: ORAL_TABLET | ORAL | Status: DC

## 2011-11-14 NOTE — Telephone Encounter (Signed)
Wife is calling with questions about some meds that were prescribed.

## 2011-11-14 NOTE — Telephone Encounter (Signed)
RX faxed to [facility] pharmacy.

## 2011-11-14 NOTE — Telephone Encounter (Signed)
Wife is concerned because the Augmentin that was given first was for 1000 MG 2 tabs twice daily. She is concerned that he will not be getting enough  With the 875 three times a day.   Paged Dr. [name] and he is going to check with Dr. Raymondo Band tomorrow.

## 2011-11-15 NOTE — Telephone Encounter (Signed)
I called Timothy [name] and his wife this afternoon. We discussed the plan to take Augmentin 875 mg q 12 for 10 more days. He will also do the same for the Flagyl. Thereafter, he will stop taking the medication until he sees Dr. Carolynne Edouard on 5/14.

## 2011-12-03 ENCOUNTER — Encounter: Payer: Self-pay | Admitting: Gastroenterology

## 2011-12-03 ENCOUNTER — Encounter (INDEPENDENT_AMBULATORY_CARE_PROVIDER_SITE_OTHER): Payer: Self-pay | Admitting: General Surgery

## 2011-12-03 ENCOUNTER — Ambulatory Visit (INDEPENDENT_AMBULATORY_CARE_PROVIDER_SITE_OTHER): Payer: PRIVATE HEALTH INSURANCE | Admitting: General Surgery

## 2011-12-03 VITALS — BP 165/97 | HR 81 | Temp 97.1°F | Ht 70.5 in | Wt 185.6 lb

## 2011-12-03 DIAGNOSIS — K5732 Diverticulitis of large intestine without perforation or abscess without bleeding: Secondary | ICD-10-CM

## 2011-12-03 DIAGNOSIS — K5792 Diverticulitis of intestine, part unspecified, without perforation or abscess without bleeding: Secondary | ICD-10-CM

## 2011-12-03 NOTE — Patient Instructions (Signed)
Plan for colonoscopy eval of colon with Dr. Madilyn Fireman Then laparoscopic assisted left colectomy

## 2011-12-04 ENCOUNTER — Encounter (INDEPENDENT_AMBULATORY_CARE_PROVIDER_SITE_OTHER): Payer: Self-pay | Admitting: General Surgery

## 2011-12-04 NOTE — Progress Notes (Signed)
Subjective:     Patient ID: Timothy [name], male   DOB: Sep 24, 1965, 46 y.o.   MRN: [id number]  HPI The patient is a 46 year old white male who we saw recently in the hospital with an episode of diverticulitis. This was apparently his third episode of diverticulitis since January. In the past he had been treated with Cipro and Flagyl but never really get any better. In the hospital recently we switched him to Augmentin and Flagyl and he has done very well on that regimen. He denies any abdominal pain. He has been off antibiotics for a couple weeks. He denies any fevers or chills. He does note feeling fatigued a lot of that time. His bowel function is pretty normal now.  Review of Systems  Constitutional: Positive for fatigue.  HENT: Negative.   Eyes: Negative.   Respiratory: Negative.   Cardiovascular: Negative.   Gastrointestinal: Negative.   Genitourinary: Negative.   Musculoskeletal: Negative.   Skin: Negative.   Neurological: Negative.   Hematological: Negative.   Psychiatric/Behavioral: Negative.        Objective:   Physical Exam  Constitutional: He is oriented to person, place, and time. He appears well-developed and well-nourished.  HENT:  Head: Normocephalic and atraumatic.  Eyes: Conjunctivae and EOM are normal. Pupils are equal, round, and reactive to light.  Neck: Normal range of motion. Neck supple.  Cardiovascular: Normal rate, regular rhythm and normal heart sounds.   Pulmonary/Chest: Effort normal and breath sounds normal.  Abdominal: Soft. Bowel sounds are normal.  Musculoskeletal: Normal range of motion.  Neurological: He is alert and oriented to person, place, and time.  Skin: Skin is warm and dry.  Psychiatric: He has a normal mood and affect. His behavior is normal.       Assessment:     The patient has been having recurrent bouts of diverticulitis despite appropriate antibiotic therapy. He is very concerned that this process is going to continue to repeat  itself until we do something more definitive for it. At this point since this is his third episode I would agree that there is certainly a higher likelihood that this will happen again. We have discussed the options in detail today including watchful waiting versus surgery for elective resection of the involved colon. He favors surgery at this point. I've discussed with him in detail the risks and benefits of the operation remove that segment of the colon as well as some of the technical aspects including the risk of leak and he understands and wishes to proceed    Plan:     At this point we would like to have the rest of his colon evaluated to make sure that there is nothing else going on in the colon that could change how we treat him. He is apparently friends with Dr. Madilyn Fireman in gastroenterology so we will refer him to Dr. Madilyn Fireman for possible preoperative colonoscopy. If this looks good then we'll plan for elective laparoscopic assisted resection of the left colon and sigmoid colon.

## 2011-12-06 ENCOUNTER — Telehealth: Payer: Self-pay | Admitting: *Deleted

## 2011-12-06 NOTE — Telephone Encounter (Signed)
That would be fine if Dr. [name] is willing to accept this patient.  Of note, his wife, Hosey Burmester is also my patient.

## 2011-12-06 NOTE — Telephone Encounter (Signed)
I will check with Dr. [name] and keep you updated.  Also, will check with patient to see if his wife will have you continue to be her PCP.  Gaylene Brooks, RN

## 2011-12-06 NOTE — Telephone Encounter (Signed)
Patient completed PCP change request form on 12/03/11.  Form states "With recent hospitalization recently Timothy [name] was my doctor on call.  He knows more about my issues I feel.  I'm much more comfortable with him being a man than a woman doctor.  Dr. Earnest Bailey is great, but I would just prefer Dr. Clinton [name]."  Will route note to Dr. Earnest Bailey informing of change request and call patient back.  Gaylene Brooks, RN

## 2011-12-06 NOTE — Telephone Encounter (Signed)
I am certainly willing to take Timothy [name] as a patient. Thank you to Dr. Earnest Bailey for agreeing to this. Please let me know if there is anything else that I need to do.

## 2011-12-09 NOTE — Telephone Encounter (Signed)
Changed patient's PCP to Dr. [name].  Patient's wife Administrator, Civil Service) will continue to have Dr. Earnest Bailey as PCP.  Gaylene Brooks, RN

## 2011-12-23 ENCOUNTER — Ambulatory Visit (INDEPENDENT_AMBULATORY_CARE_PROVIDER_SITE_OTHER): Payer: Self-pay | Admitting: Gastroenterology

## 2011-12-23 ENCOUNTER — Encounter: Payer: Self-pay | Admitting: Gastroenterology

## 2011-12-23 VITALS — BP 130/76 | HR 88 | Ht 70.0 in | Wt 185.2 lb

## 2011-12-23 DIAGNOSIS — K921 Melena: Secondary | ICD-10-CM

## 2011-12-23 DIAGNOSIS — K5732 Diverticulitis of large intestine without perforation or abscess without bleeding: Secondary | ICD-10-CM

## 2011-12-23 DIAGNOSIS — K6289 Other specified diseases of anus and rectum: Secondary | ICD-10-CM

## 2011-12-23 MED ORDER — PEG-KCL-NACL-NASULF-NA ASC-C 100 G PO SOLR: 1.0000 | Freq: Once | ORAL | Status: DC

## 2011-12-23 MED ORDER — HYDROCORTISONE ACE-PRAMOXINE 2.5-1 % RE CREA: TOPICAL_CREAM | Freq: Three times a day (TID) | RECTAL | Status: AC

## 2011-12-23 NOTE — Patient Instructions (Addendum)
You have been given a separate informational sheet regarding your tobacco use, the importance of quitting and local resources to help you quit.  You have been scheduled for a colonoscopy with propofol. Please follow written instructions given to you at your visit today.  Please pick up your prep kit at the pharmacy within the next 1-3 days.  We have sent the following medications to your pharmacy for you to pick up at your convenience:Analpram; use as directed.  Moviprep; you were given instructions at your office visit today.

## 2011-12-23 NOTE — Progress Notes (Signed)
History of Present Illness: This is a 46 year old male who was hospitalized with recurrent diverticulitis. He is here today with his wife. I reviewed records and imaging studies from his hospitalizations. Diverticulitis involved splenic flexure, descending colon and sigmoid colon. He was treated with intravenous antibiotics and a course of oral antibiotics. He saw Dr. Carolynne Edouard in followup and has made a good recovery. This was his 3rd episode of diverticulitis since January. He has rectal bleeding and March which he attributes to hemorrhoids. For the past several days he has complained of anal pain and swelling which has been treated with sitz baths and hemorrhoidal creams. Denies weight loss, abdominal pain, constipation, diarrhea, change in stool caliber, melena, nausea, vomiting, dysphagia, reflux symptoms, chest pain.  Review of Systems: Pertinent positive and negative review of systems were noted in the above HPI section. All other review of systems were otherwise negative.  Current Medications, Allergies, Past Medical History, Past Surgical History, Family History and Social History were reviewed in [employer] record.  Physical Exam: General: Well developed , well nourished, no acute distress Head: Normocephalic and atraumatic Eyes:  sclerae anicteric, EOMI Ears: Normal auditory acuity Mouth: No deformity or lesions Neck: Supple, no masses or thyromegaly Lungs: Clear throughout to auscultation Heart: Regular rate and rhythm; no murmurs, rubs or bruits Abdomen: Soft, non tender and non distended. No masses, hepatosplenomegaly or hernias noted. Normal Bowel sounds Rectal: External anal canal lesions which could be condyloma and/or external hemorrhoids, no internal lesions Hemoccult negative stool. Musculoskeletal: Symmetrical with no gross deformities  Skin: No lesions on visible extremities Pulses:  Normal pulses noted Extremities: No clubbing, cyanosis, edema or  deformities noted Neurological: Alert oriented x 4, grossly nonfocal Cervical Nodes:  No significant cervical adenopathy Inguinal Nodes: No significant inguinal adenopathy Psychological:  Alert and cooperative. Normal mood and affect  Assessment and Recommendations:  1. Diverticulitis requiring 2 hospitalizations. 3rd episode since January. Intermittent rectal bleeding and rectal pain. Rule out colorectal neoplasms, IBD, hemorrhoids, condyloma. The risks, benefits, and alternatives to colonoscopy with possible biopsy, possible destruction of internal hemorrhoids and possible polypectomy were discussed with the patient and they consent to proceed. Begin Analpram-HC cream bid prn. Dr. Carolynne Edouard to evaluate for possible condyloma and for further treatment.

## 2011-12-24 ENCOUNTER — Encounter: Payer: Self-pay | Admitting: Gastroenterology

## 2011-12-24 ENCOUNTER — Ambulatory Visit (AMBULATORY_SURGERY_CENTER): Payer: Self-pay | Admitting: Gastroenterology

## 2011-12-24 VITALS — BP 144/86 | HR 72 | Temp 98.2°F | Resp 18 | Ht 70.0 in | Wt 185.0 lb

## 2011-12-24 DIAGNOSIS — D126 Benign neoplasm of colon, unspecified: Secondary | ICD-10-CM

## 2011-12-24 DIAGNOSIS — R933 Abnormal findings on diagnostic imaging of other parts of digestive tract: Secondary | ICD-10-CM

## 2011-12-24 DIAGNOSIS — K5732 Diverticulitis of large intestine without perforation or abscess without bleeding: Secondary | ICD-10-CM

## 2011-12-24 MED ORDER — SODIUM CHLORIDE 0.9 % IV SOLN: 500.0000 mL | INTRAVENOUS | Status: DC

## 2011-12-24 NOTE — Patient Instructions (Signed)
YOU HAD AN ENDOSCOPIC PROCEDURE TODAY AT THE [facility]: Refer to the procedure report that was given to you for any specific questions about what was found during the examination.  If the procedure report does not answer your questions, please call your gastroenterologist to clarify.  If you requested that your care partner not be given the details of your procedure findings, then the procedure report has been included in a sealed envelope for you to review at your convenience later.  YOU SHOULD EXPECT: Some feelings of bloating in the abdomen. Passage of more gas than usual.  Walking can help get rid of the air that was put into your GI tract during the procedure and reduce the bloating. If you had a lower endoscopy (such as a colonoscopy or flexible sigmoidoscopy) you may notice spotting of blood in your stool or on the toilet paper. If you underwent a bowel prep for your procedure, then you may not have a normal bowel movement for a few days.  DIET: Your first meal following the procedure should be a light meal and then it is ok to progress to your normal diet.  A half-sandwich or bowl of soup is an example of a good first meal.  Heavy or fried foods are harder to digest and may make you feel nauseous or bloated.  Likewise meals heavy in dairy and vegetables can cause extra gas to form and this can also increase the bloating.  Drink plenty of fluids but you should avoid alcoholic beverages for 24 hours.  ACTIVITY: Your care partner should take you home directly after the procedure.  You should plan to take it easy, moving slowly for the rest of the day.  You can resume normal activity the day after the procedure however you should NOT DRIVE or use heavy machinery for 24 hours (because of the sedation medicines used during the test).    SYMPTOMS TO REPORT IMMEDIATELY: A gastroenterologist can be reached at any hour.  During normal business hours, 8:30 AM to 5:00 PM Monday through Friday,  call [phone].  After hours and on weekends, please call the GI answering service at [phone] who will take a message and have the physician on call contact you.   Following lower endoscopy (colonoscopy or flexible sigmoidoscopy):  Excessive amounts of blood in the stool  Significant tenderness or worsening of abdominal pains  Swelling of the abdomen that is new, acute  Fever of 100F or higher    FOLLOW UP: If any biopsies were taken you will be contacted by phone or by letter within the next 1-3 weeks.  Call your gastroenterologist if you have not heard about the biopsies in 3 weeks.  Our staff will call the home number listed on your records the next business day following your procedure to check on you and address any questions or concerns that you may have at that time regarding the information given to you following your procedure. This is a courtesy call and so if there is no answer at the home number and we have not heard from you through the emergency physician on call, we will assume that you have returned to your regular daily activities without incident.  SIGNATURES/CONFIDENTIALITY: You and/or your care partner have signed paperwork which will be entered into your electronic medical record.  These signatures attest to the fact that that the information above on your After Visit Summary has been reviewed and is understood.  Full responsibility of the confidentiality   of this discharge information lies with you and/or your care-partner.     

## 2011-12-24 NOTE — Op Note (Signed)
[facility] [address] [place], [place]  [address]  COLONOSCOPY PROCEDURE REPORT  PATIENT:  Timothy [name], Timothy [name]  MR#:  [id number] BIRTHDATE:  [birth year], 45 yrs. old  GENDER:  male ENDOSCOPIST:  Judie Petit T. Russella Dar, MD, [facility] Referred by:  Chevis Pretty, M.D. PROCEDURE DATE:  12/24/2011 PROCEDURE:  Colonoscopy with snare polypectomy ASA CLASS:  Class II INDICATIONS:  1) Abnormal CT of abdomen 2) recurrent diverticulitis MEDICATIONS:   MAC sedation, administered by CRNA, propofol (Diprivan) 500 mg IV DESCRIPTION OF PROCEDURE:   After the risks benefits and alternatives of the procedure were thoroughly explained, informed consent was obtained.  Digital rectal exam was performed and revealed a perianal lesions c/w condyloma and a possible hemorrhoid.   The LB CF-[id number] endoscope was introduced through the anus and advanced to the cecum, which was identified by both the appendix and ileocecal valve, without limitations. The quality of the prep was good, using MoviPrep.  The instrument was then slowly withdrawn as the colon was fully examined. <<PROCEDUREIMAGES>> FINDINGS:  Moderate diverticulosis was found in the sigmoid to descending colon.  A sessile polyp was found in the sigmoid colon. It was 5 mm in size. Polyp was snared without cautery. Retrieval was successful. Otherwise normal colonoscopy without other polyps, masses, vascular ectasias, or inflammatory changes. Retroflexed views in the rectum revealed no abnormalities. The time to cecum = 2.25  minutes. The scope was then withdrawn (time =  9.75  min) from the patient and the procedure completed.  COMPLICATIONS:  None  ENDOSCOPIC IMPRESSION: 1) Moderate diverticulosis in the sigmoid to descending colon 2) 5 mm sessile polyp in the sigmoid colon  RECOMMENDATIONS: 1) Await pathology results 2) High fiber diet with liberal fluid intake 3) Repeat Colonoscopy in 5 years if polyp is adenomatous, otherwise 10  years 4) Follow up with Dr. Elsie Amis T. Russella Dar, MD, [facility]  n. eSIGNED:   Venita Lick. Quinteria Chisum at 12/24/2011 04:07 PM  Audley Hose, [id number]

## 2011-12-25 ENCOUNTER — Telehealth: Payer: Self-pay

## 2011-12-25 NOTE — Telephone Encounter (Signed)
  Follow up Call-  Call back number 12/24/2011  Post procedure Call Back phone  # [phone]  Permission to leave phone message Yes     Patient questions:  Do you have a fever, pain , or abdominal swelling? no Pain Score  0 *  Have you tolerated food without any problems? yes  Have you been able to return to your normal activities? yes  Do you have any questions about your discharge instructions: Diet   no Medications  no Follow up visit  no  Do you have questions or concerns about your Care? no  Actions: * If pain score is 4 or above: No action needed, pain <4.  I spoke with the pt's wife and she said he did fine and did not have any problems.  MAW

## 2011-12-30 ENCOUNTER — Telehealth (INDEPENDENT_AMBULATORY_CARE_PROVIDER_SITE_OTHER): Payer: Self-pay

## 2011-12-30 ENCOUNTER — Encounter: Payer: Self-pay | Admitting: Gastroenterology

## 2011-12-30 NOTE — Telephone Encounter (Signed)
The patient's wife called to report that Dr Russella Dar did a colonoscopy and he can get his colon surgery set up.  He also has really bad hemorrhoids that he has been given a cream for.  He has pain, bleeding and is soaking TID.  He is out of work right now and would like to have the hemorrhoids taken care of at the time of surgery too.  You can call the wife at [phone] cell or home [phone].

## 2012-01-01 ENCOUNTER — Telehealth (INDEPENDENT_AMBULATORY_CARE_PROVIDER_SITE_OTHER): Payer: Self-pay | Admitting: General Surgery

## 2012-01-01 NOTE — Telephone Encounter (Signed)
Kendall called [name] back and [facility]

## 2012-01-01 NOTE — Telephone Encounter (Signed)
Returned phone call and LMOM for Timothy [name] to call me back.

## 2012-01-02 ENCOUNTER — Telehealth (INDEPENDENT_AMBULATORY_CARE_PROVIDER_SITE_OTHER): Payer: Self-pay | Admitting: General Surgery

## 2012-01-02 NOTE — Telephone Encounter (Signed)
Patients wife called me and said that they decided they did not want to post pone his surgery for the hemorrhoids.  I explained to the wife that Dr. Carolynne Edouard will be back in on 6/17 and that I would have him fill out the orders for his colon surgery and then after his surgery we can revisit the idea of taking care of the hemorrhoid problem.

## 2012-01-07 ENCOUNTER — Telehealth (INDEPENDENT_AMBULATORY_CARE_PROVIDER_SITE_OTHER): Payer: Self-pay | Admitting: General Surgery

## 2012-01-07 ENCOUNTER — Other Ambulatory Visit (INDEPENDENT_AMBULATORY_CARE_PROVIDER_SITE_OTHER): Payer: Self-pay | Admitting: General Surgery

## 2012-01-07 NOTE — Telephone Encounter (Signed)
Spoke with patients wife after attempting to reach him on the house phone.  I let her know that I have sent his surgery orders around to the surgery schedulers and that I have mailed him a bowel prep instruction sheet that he will need to complete before surgery.  I also informed her to make sure he knows that he should not take ASA or herbal supplements 5 days before surgery.  She understood and said she would tell her husband.

## 2012-01-08 NOTE — Telephone Encounter (Signed)
I will need to see him for hemorrhoid before surgery

## 2012-01-09 ENCOUNTER — Other Ambulatory Visit: Payer: Self-pay | Admitting: Gastroenterology

## 2012-01-09 MED ORDER — HYDROCORTISONE ACE-PRAMOXINE 2.5-1 % RE CREA: TOPICAL_CREAM | Freq: Three times a day (TID) | RECTAL | Status: AC | PRN

## 2012-01-09 NOTE — Telephone Encounter (Signed)
rx sent

## 2012-02-04 ENCOUNTER — Ambulatory Visit (INDEPENDENT_AMBULATORY_CARE_PROVIDER_SITE_OTHER): Payer: Medicaid Other | Admitting: General Surgery

## 2012-02-04 ENCOUNTER — Encounter (INDEPENDENT_AMBULATORY_CARE_PROVIDER_SITE_OTHER): Payer: Self-pay | Admitting: General Surgery

## 2012-02-04 ENCOUNTER — Encounter (HOSPITAL_COMMUNITY): Payer: Self-pay | Admitting: Pharmacy Technician

## 2012-02-04 VITALS — BP 142/84 | HR 76 | Temp 97.3°F | Resp 16 | Ht 71.0 in | Wt 187.0 lb

## 2012-02-04 DIAGNOSIS — K5792 Diverticulitis of intestine, part unspecified, without perforation or abscess without bleeding: Secondary | ICD-10-CM

## 2012-02-04 DIAGNOSIS — K5732 Diverticulitis of large intestine without perforation or abscess without bleeding: Secondary | ICD-10-CM

## 2012-02-04 DIAGNOSIS — A630 Anogenital (venereal) warts: Secondary | ICD-10-CM

## 2012-02-04 HISTORY — DX: Anogenital (venereal) warts: A63.0

## 2012-02-04 NOTE — Patient Instructions (Addendum)
Will need to have condyloma treated at time of surgery

## 2012-02-12 NOTE — Pre-Procedure Instructions (Signed)
20 Timothy [name]  02/12/2012   Your procedure is scheduled on:  Wed, July 31 @ 8:30 AM  Report to [facility] at 6:30 AM.  Call this number if you have problems the morning of surgery: [phone]   Remember:   Do not eat food:After Midnight.    Do not wear jewelry  Do not wear lotions, powders, or colognes  Men may shave face and neck.  Do not bring valuables to the hospital.  Contacts, dentures or bridgework may not be worn into surgery.  Leave suitcase in the car. After surgery it may be brought to your room.  For patients admitted to the hospital, checkout time is 11:00 AM the day of discharge.   Patients discharged the day of surgery will not be allowed to drive home.    Special Instructions: CHG Shower Use Special Wash: 1/2 bottle night before surgery and 1/2 bottle morning of surgery.   Please read over the following fact sheets that you were given: Pain Booklet, Coughing and Deep Breathing, MRSA Information and Surgical Site Infection Prevention

## 2012-02-13 ENCOUNTER — Encounter (HOSPITAL_COMMUNITY)
Admission: RE | Admit: 2012-02-13 | Discharge: 2012-02-13 | Disposition: A | Discharge status: Home / Self Care | Payer: Medicaid Other | Source: Ambulatory Visit | Attending: General Surgery | Admitting: General Surgery

## 2012-02-13 ENCOUNTER — Telehealth (INDEPENDENT_AMBULATORY_CARE_PROVIDER_SITE_OTHER): Payer: Self-pay | Admitting: General Surgery

## 2012-02-13 ENCOUNTER — Encounter (HOSPITAL_COMMUNITY): Payer: Self-pay

## 2012-02-13 HISTORY — DX: Unspecified glaucoma: H40.9

## 2012-02-13 HISTORY — DX: Bronchitis, not specified as acute or chronic: J40

## 2012-02-13 HISTORY — DX: Headache: R51

## 2012-02-13 HISTORY — DX: Abnormal levels of other serum enzymes: R74.8

## 2012-02-13 HISTORY — DX: Family history of other specified conditions: Z84.89

## 2012-02-13 LAB — COMPREHENSIVE METABOLIC PANEL
AST: 18 U/L (ref 0–37)
Albumin: 4.2 g/dL (ref 3.5–5.2)
Alkaline Phosphatase: 83 U/L (ref 39–117)
BUN: 12 mg/dL (ref 6–23)
CO2: 28 mEq/L (ref 19–32)
Chloride: 103 mEq/L (ref 96–112)
Creatinine, Ser: 0.71 mg/dL (ref 0.50–1.35)
GFR calc non Af Amer: >90 mL/min (ref >90)
Potassium: 3.9 mEq/L (ref 3.5–5.1)
Total Bilirubin: 0.2 mg/dL — ABNORMAL LOW (ref 0.3–1.2)

## 2012-02-13 LAB — TYPE AND SCREEN
ABO/RH(D): O POS
Antibody Screen: NEGATIVE

## 2012-02-13 LAB — CBC
HCT: 45.1 % (ref 39.0–52.0)
Platelets: 267 10*3/uL (ref 150–400)
RDW: 17.7 % — ABNORMAL HIGH (ref 11.5–15.5)
WBC: 7.5 10*3/uL (ref 4.0–10.5)

## 2012-02-13 NOTE — Progress Notes (Signed)
Pt instructed to not take any Aspirin, NSAIDS.  I encouraged pt to see or speak with his PCP to get a prescription for anti anxiety.  Pts wife is trying to make an appointment with [facility] now.

## 2012-02-13 NOTE — Progress Notes (Addendum)
Note removed was for a different patient.

## 2012-02-13 NOTE — Telephone Encounter (Signed)
Wife calling in to go over bowel prep (Miralax/Gatorade).  Went over the instruction sheet she has and answered her questions.

## 2012-02-18 MED ORDER — CHLORHEXIDINE GLUCONATE 4 % EX LIQD: 1.0000 "application " | Freq: Once | CUTANEOUS | Status: DC

## 2012-02-18 MED ORDER — DEXTROSE 5 % IV SOLN
2.0000 g | INTRAVENOUS | Status: AC
  Administered 2012-02-19: 2 g via INTRAVENOUS
  Filled 2012-02-18: qty 2

## 2012-02-18 MED ORDER — SODIUM CHLORIDE 0.9 % IV SOLN
1.0000 g | INTRAVENOUS | Status: AC
  Administered 2012-02-19: 1 g via INTRAVENOUS
  Filled 2012-02-18: qty 1

## 2012-02-18 NOTE — Progress Notes (Signed)
Subjective:     Patient ID: Timothy [name], male   DOB: Dec 04, 1965, 46 y.o.   MRN: [id number]  HPI The patient is a 46 year old white male who we have been following for her episodes of diverticulitis. He had 3 episodes of diverticulitis earlier this year which responded very slowly to antibiotics. He would like to avoid a colostomy and is very concerned that he will have another episode. Because of this we have him scheduled for elective sigmoid colectomy. Since his last visit he has developed some discomfort in his perirectal area. He also notes bleeding with his bowel movements.  Review of Systems  Constitutional: Negative.   HENT: Negative.   Eyes: Negative.   Respiratory: Negative.   Cardiovascular: Negative.   Gastrointestinal: Positive for blood in stool and rectal pain.  Genitourinary: Negative.   Musculoskeletal: Negative.   Skin: Negative.   Neurological: Negative.   Hematological: Negative.   Psychiatric/Behavioral: Negative.        Objective:   Physical Exam  Constitutional: He is oriented to person, place, and time. He appears well-developed and well-nourished.  HENT:  Head: Normocephalic and atraumatic.  Eyes: Conjunctivae and EOM are normal. Pupils are equal, round, and reactive to light.  Neck: Normal range of motion. Neck supple.  Cardiovascular: Normal rate, regular rhythm and normal heart sounds.   Pulmonary/Chest: Effort normal and breath sounds normal.  Abdominal: Soft. Bowel sounds are normal. He exhibits no mass. There is no tenderness.  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 No evidence of hemorrhoid disease  Musculoskeletal: Normal range of motion.  Neurological: He is alert and oriented to person, place, and time.  Skin: Skin is warm and dry.  Psychiatric: He has a normal mood and affect. His behavior is normal.       Assessment:     The patient has 2 problems at this point. The first is recurrent  sigmoid diverticulitis for which he is scheduled for elective sigmoid colectomy in the near future. 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 I believe this will need to be destroyed in the operating room. I've discussed with him in detail the risks and benefits of the operation to treat both of these problems as well as some appendical aspects and he understands and wishes to proceed.    Plan:     Plan for laparoscopic assisted sigmoid colectomy as well as destruction of anal condyloma

## 2012-02-19 ENCOUNTER — Encounter (HOSPITAL_COMMUNITY)
Admission: RE | Disposition: A | Discharge status: Home / Self Care | Payer: Self-pay | Source: Ambulatory Visit | Attending: General Surgery

## 2012-02-19 ENCOUNTER — Encounter (HOSPITAL_COMMUNITY): Payer: Self-pay | Admitting: Anesthesiology

## 2012-02-19 ENCOUNTER — Encounter (HOSPITAL_COMMUNITY): Payer: Self-pay | Admitting: *Deleted

## 2012-02-19 ENCOUNTER — Encounter (HOSPITAL_COMMUNITY): Payer: Self-pay | Admitting: Vascular Surgery

## 2012-02-19 ENCOUNTER — Ambulatory Visit (HOSPITAL_COMMUNITY): Payer: Medicaid Other | Admitting: Anesthesiology

## 2012-02-19 ENCOUNTER — Inpatient Hospital Stay (HOSPITAL_COMMUNITY)
Admission: RE | Admit: 2012-02-19 | Discharge: 2012-02-23 | DRG: 331 | Disposition: A | Discharge status: Home / Self Care | Payer: Medicaid Other | Source: Ambulatory Visit | Attending: General Surgery | Admitting: General Surgery

## 2012-02-19 DIAGNOSIS — K5792 Diverticulitis of intestine, part unspecified, without perforation or abscess without bleeding: Secondary | ICD-10-CM

## 2012-02-19 DIAGNOSIS — K5730 Diverticulosis of large intestine without perforation or abscess without bleeding: Secondary | ICD-10-CM

## 2012-02-19 DIAGNOSIS — A630 Anogenital (venereal) warts: Secondary | ICD-10-CM

## 2012-02-19 DIAGNOSIS — K5732 Diverticulitis of large intestine without perforation or abscess without bleeding: Principal | ICD-10-CM | POA: Diagnosis present

## 2012-02-19 HISTORY — PX: LAPAROSCOPIC LEFT COLON RESECTION: SHX1928

## 2012-02-19 HISTORY — PX: WART FULGURATION: SHX5245

## 2012-02-19 LAB — GLUCOSE, CAPILLARY: Glucose-Capillary: 107 mg/dL — ABNORMAL HIGH (ref 70–99)

## 2012-02-19 SURGERY — COLECTOMY, SIGMOID, LAPAROSCOPIC
Anesthesia: General | Site: Anus | Wound class: Clean Contaminated

## 2012-02-19 MED ORDER — DIPHENHYDRAMINE HCL 12.5 MG/5ML PO ELIX
12.5000 mg | ORAL_SOLUTION | Freq: Four times a day (QID) | ORAL | Status: DC | PRN
  Filled 2012-02-19: qty 5

## 2012-02-19 MED ORDER — BUPIVACAINE-EPINEPHRINE PF 0.25-1:200000 % IJ SOLN
INTRAMUSCULAR | Status: AC
  Filled 2012-02-19: qty 30

## 2012-02-19 MED ORDER — VECURONIUM BROMIDE 10 MG IV SOLR
INTRAVENOUS | Status: DC | PRN
  Administered 2012-02-19: 10 mg via INTRAVENOUS
  Administered 2012-02-19: 3 mg via INTRAVENOUS
  Administered 2012-02-19: 2 mg via INTRAVENOUS

## 2012-02-19 MED ORDER — LACTATED RINGERS IV SOLN
INTRAVENOUS | Status: DC | PRN
  Administered 2012-02-19 (×2): via INTRAVENOUS

## 2012-02-19 MED ORDER — SODIUM CHLORIDE 0.9 % IR SOLN
Status: DC | PRN
  Administered 2012-02-19: 1000 mL

## 2012-02-19 MED ORDER — ONDANSETRON HCL 4 MG/2ML IJ SOLN: 4.0000 mg | Freq: Four times a day (QID) | INTRAMUSCULAR | Status: DC | PRN

## 2012-02-19 MED ORDER — 0.9 % SODIUM CHLORIDE (POUR BTL) OPTIME
TOPICAL | Status: DC | PRN
  Administered 2012-02-19 (×2): 2000 mL

## 2012-02-19 MED ORDER — POVIDONE-IODINE 10 % EX OINT
TOPICAL_OINTMENT | CUTANEOUS | Status: AC
  Filled 2012-02-19: qty 28.35

## 2012-02-19 MED ORDER — DIBUCAINE 1 % RE OINT
TOPICAL_OINTMENT | RECTAL | Status: DC | PRN
  Filled 2012-02-19: qty 28

## 2012-02-19 MED ORDER — ONDANSETRON HCL 4 MG PO TABS: 4.0000 mg | ORAL_TABLET | Freq: Four times a day (QID) | ORAL | Status: DC | PRN

## 2012-02-19 MED ORDER — PROPOFOL 10 MG/ML IV EMUL
INTRAVENOUS | Status: DC | PRN
  Administered 2012-02-19: 200 mg via INTRAVENOUS

## 2012-02-19 MED ORDER — HYDROMORPHONE HCL PF 1 MG/ML IJ SOLN
0.2500 mg | INTRAMUSCULAR | Status: DC | PRN
  Administered 2012-02-19 (×4): 0.5 mg via INTRAVENOUS

## 2012-02-19 MED ORDER — DIBUCAINE 1 % RE OINT
TOPICAL_OINTMENT | RECTAL | Status: DC | PRN
  Administered 2012-02-19: 1 via RECTAL

## 2012-02-19 MED ORDER — DEXAMETHASONE SODIUM PHOSPHATE 10 MG/ML IJ SOLN
INTRAMUSCULAR | Status: DC | PRN
  Administered 2012-02-19: 8 mg via INTRAVENOUS

## 2012-02-19 MED ORDER — PANTOPRAZOLE SODIUM 40 MG IV SOLR
40.0000 mg | INTRAVENOUS | Status: DC
  Administered 2012-02-19 – 2012-02-21 (×3): 40 mg via INTRAVENOUS
  Filled 2012-02-19 (×4): qty 40

## 2012-02-19 MED ORDER — BUPIVACAINE-EPINEPHRINE 0.25% -1:200000 IJ SOLN
INTRAMUSCULAR | Status: DC | PRN
  Administered 2012-02-19: 14 mL
  Administered 2012-02-19: 13 mL

## 2012-02-19 MED ORDER — HYDROMORPHONE HCL PF 1 MG/ML IJ SOLN
INTRAMUSCULAR | Status: AC
  Filled 2012-02-19: qty 1

## 2012-02-19 MED ORDER — MIDAZOLAM HCL 5 MG/5ML IJ SOLN
INTRAMUSCULAR | Status: DC | PRN
  Administered 2012-02-19: 2 mg via INTRAVENOUS

## 2012-02-19 MED ORDER — ONDANSETRON HCL 4 MG/2ML IJ SOLN
INTRAMUSCULAR | Status: DC | PRN
  Administered 2012-02-19: 4 mg via INTRAVENOUS

## 2012-02-19 MED ORDER — NICOTINE 21 MG/24HR TD PT24
21.0000 mg | MEDICATED_PATCH | TRANSDERMAL | Status: DC
  Administered 2012-02-19 – 2012-02-22 (×4): 21 mg via TRANSDERMAL
  Filled 2012-02-19 (×7): qty 1

## 2012-02-19 MED ORDER — ENOXAPARIN SODIUM 40 MG/0.4ML ~~LOC~~ SOLN
40.0000 mg | SUBCUTANEOUS | Status: DC
  Administered 2012-02-20 – 2012-02-23 (×4): 40 mg via SUBCUTANEOUS
  Filled 2012-02-19 (×5): qty 0.4

## 2012-02-19 MED ORDER — GLYCOPYRROLATE 0.2 MG/ML IJ SOLN
INTRAMUSCULAR | Status: DC | PRN
  Administered 2012-02-19: .8 mg via INTRAVENOUS

## 2012-02-19 MED ORDER — MORPHINE SULFATE 4 MG/ML IJ SOLN: 4.0000 mg | INTRAMUSCULAR | Status: DC | PRN

## 2012-02-19 MED ORDER — LIDOCAINE HCL (CARDIAC) 20 MG/ML IV SOLN
INTRAVENOUS | Status: DC | PRN
  Administered 2012-02-19: 50 mg via INTRAVENOUS
  Administered 2012-02-19: 100 mg via INTRAVENOUS

## 2012-02-19 MED ORDER — MORPHINE SULFATE (PF) 1 MG/ML IV SOLN
INTRAVENOUS | Status: DC
  Administered 2012-02-19: 6 mg via INTRAVENOUS
  Administered 2012-02-19 (×2): via INTRAVENOUS
  Administered 2012-02-19: 6 mg via INTRAVENOUS
  Administered 2012-02-20: 19 mg via INTRAVENOUS
  Administered 2012-02-20: 18:00:00 via INTRAVENOUS
  Administered 2012-02-20: 1.5 mg via INTRAVENOUS
  Administered 2012-02-20: 16 mg via INTRAVENOUS
  Administered 2012-02-20 (×2): via INTRAVENOUS
  Administered 2012-02-20: 25 mg via INTRAVENOUS
  Administered 2012-02-20: 19.5 mg via INTRAVENOUS
  Administered 2012-02-20: 6 mg via INTRAVENOUS
  Filled 2012-02-19 (×5): qty 25

## 2012-02-19 MED ORDER — DIBUCAINE 1 % RE OINT
TOPICAL_OINTMENT | RECTAL | Status: AC
  Filled 2012-02-19: qty 28

## 2012-02-19 MED ORDER — NALOXONE HCL 0.4 MG/ML IJ SOLN: 0.4000 mg | INTRAMUSCULAR | Status: DC | PRN

## 2012-02-19 MED ORDER — MORPHINE SULFATE 10 MG/ML IJ SOLN
INTRAMUSCULAR | Status: DC | PRN
  Administered 2012-02-19: 4 mg via INTRAVENOUS

## 2012-02-19 MED ORDER — KCL IN DEXTROSE-NACL 20-5-0.9 MEQ/L-%-% IV SOLN
INTRAVENOUS | Status: DC
  Administered 2012-02-19 – 2012-02-22 (×7): via INTRAVENOUS
  Filled 2012-02-19 (×8): qty 1000

## 2012-02-19 MED ORDER — ALVIMOPAN 12 MG PO CAPS
12.0000 mg | ORAL_CAPSULE | Freq: Once | ORAL | Status: AC
  Administered 2012-02-19: 12 mg via ORAL
  Filled 2012-02-19: qty 1

## 2012-02-19 MED ORDER — NEOSTIGMINE METHYLSULFATE 1 MG/ML IJ SOLN
INTRAMUSCULAR | Status: DC | PRN
  Administered 2012-02-19: 4 mg via INTRAVENOUS

## 2012-02-19 MED ORDER — ALVIMOPAN 12 MG PO CAPS
12.0000 mg | ORAL_CAPSULE | Freq: Two times a day (BID) | ORAL | Status: DC
  Administered 2012-02-20 – 2012-02-23 (×7): 12 mg via ORAL
  Filled 2012-02-19 (×8): qty 1

## 2012-02-19 MED ORDER — BUPIVACAINE LIPOSOME 1.3 % IJ SUSP
20.0000 mL | Freq: Once | INTRAMUSCULAR | Status: AC
  Administered 2012-02-19: 20 mL
  Filled 2012-02-19: qty 20

## 2012-02-19 MED ORDER — SODIUM CHLORIDE 0.9 % IJ SOLN: 9.0000 mL | INTRAMUSCULAR | Status: DC | PRN

## 2012-02-19 MED ORDER — DIPHENHYDRAMINE HCL 50 MG/ML IJ SOLN
12.5000 mg | Freq: Four times a day (QID) | INTRAMUSCULAR | Status: DC | PRN
  Administered 2012-02-20 (×2): 12.5 mg via INTRAVENOUS
  Filled 2012-02-19 (×2): qty 1

## 2012-02-19 MED ORDER — FENTANYL CITRATE 0.05 MG/ML IJ SOLN
INTRAMUSCULAR | Status: DC | PRN
  Administered 2012-02-19: 50 ug via INTRAVENOUS
  Administered 2012-02-19: 150 ug via INTRAVENOUS
  Administered 2012-02-19 (×7): 50 ug via INTRAVENOUS

## 2012-02-19 SURGICAL SUPPLY — 71 items
APPLIER CLIP ROT 10 11.4 M/L (STAPLE)
BLADE SURG 10 STRL SS (BLADE) ×3 IMPLANT
BLADE SURG ROTATE 9660 (MISCELLANEOUS) ×3 IMPLANT
CANISTER SUCTION 2500CC (MISCELLANEOUS) ×3 IMPLANT
CELLS DAT CNTRL 66122 CELL SVR (MISCELLANEOUS) ×2 IMPLANT
CHLORAPREP W/TINT 26ML (MISCELLANEOUS) ×3 IMPLANT
CLIP APPLIE ROT 10 11.4 M/L (STAPLE) IMPLANT
CLOTH BEACON ORANGE TIMEOUT ST (SAFETY) ×3 IMPLANT
COVER MAYO STAND STRL (DRAPES) ×3 IMPLANT
COVER SURGICAL LIGHT HANDLE (MISCELLANEOUS) ×6 IMPLANT
DECANTER SPIKE VIAL GLASS SM (MISCELLANEOUS) ×3 IMPLANT
DRAPE PROXIMA HALF (DRAPES) ×9 IMPLANT
DRAPE UTILITY 15X26 W/TAPE STR (DRAPE) ×6 IMPLANT
DRAPE WARM FLUID 44X44 (DRAPE) ×3 IMPLANT
DRSG PAD ABDOMINAL 8X10 ST (GAUZE/BANDAGES/DRESSINGS) ×3 IMPLANT
ELECT CAUTERY BLADE 6.4 (BLADE) ×6 IMPLANT
ELECT REM PT RETURN 9FT ADLT (ELECTROSURGICAL) ×3
ELECTRODE REM PT RTRN 9FT ADLT (ELECTROSURGICAL) ×2 IMPLANT
GEL ULTRASOUND 20GR AQUASONIC (MISCELLANEOUS) IMPLANT
GLOVE BIO SURGEON STRL SZ7.5 (GLOVE) ×15 IMPLANT
GLOVE BIOGEL PI IND STRL 7.0 (GLOVE) ×2 IMPLANT
GLOVE BIOGEL PI IND STRL 7.5 (GLOVE) ×6 IMPLANT
GLOVE BIOGEL PI INDICATOR 7.0 (GLOVE) ×1
GLOVE BIOGEL PI INDICATOR 7.5 (GLOVE) ×3
GLOVE SS BIOGEL STRL SZ 6.5 (GLOVE) ×2 IMPLANT
GLOVE SUPERSENSE BIOGEL SZ 6.5 (GLOVE) ×1
GOWN EXTRA PROTECTION XL (GOWNS) ×6 IMPLANT
GOWN STRL NON-REIN LRG LVL3 (GOWN DISPOSABLE) ×15 IMPLANT
KIT BASIN OR (CUSTOM PROCEDURE TRAY) ×3 IMPLANT
KIT ROOM TURNOVER OR (KITS) ×3 IMPLANT
LEGGING LITHOTOMY PAIR STRL (DRAPES) ×3 IMPLANT
LIGASURE IMPACT 36 18CM CVD LR (INSTRUMENTS) IMPLANT
NS IRRIG 1000ML POUR BTL (IV SOLUTION) ×12 IMPLANT
PAD ARMBOARD 7.5X6 YLW CONV (MISCELLANEOUS) ×6 IMPLANT
PENCIL BUTTON HOLSTER BLD 10FT (ELECTRODE) ×9 IMPLANT
RTRCTR WOUND ALEXIS 18CM MED (MISCELLANEOUS) ×3
SCALPEL HARMONIC ACE (MISCELLANEOUS) ×3 IMPLANT
SCISSORS LAP 5X35 DISP (ENDOMECHANICALS) IMPLANT
SET IRRIG TUBING LAPAROSCOPIC (IRRIGATION / IRRIGATOR) IMPLANT
SLEEVE ENDOPATH XCEL 5M (ENDOMECHANICALS) ×6 IMPLANT
SPECIMEN JAR LARGE (MISCELLANEOUS) ×3 IMPLANT
SPONGE GAUZE 4X4 12PLY (GAUZE/BANDAGES/DRESSINGS) ×6 IMPLANT
STAPLER VISISTAT 35W (STAPLE) ×3 IMPLANT
SURGILUBE 2OZ TUBE FLIPTOP (MISCELLANEOUS) ×6 IMPLANT
SUT PDS AB 1 CT  36 (SUTURE)
SUT PDS AB 1 CT 36 (SUTURE) IMPLANT
SUT PROLENE 2 0 CT2 30 (SUTURE) IMPLANT
SUT PROLENE 2 0 KS (SUTURE) IMPLANT
SUT SILK 2 0 (SUTURE) ×1
SUT SILK 2 0 SH CR/8 (SUTURE) ×18 IMPLANT
SUT SILK 2-0 18XBRD TIE 12 (SUTURE) ×2 IMPLANT
SUT SILK 3 0 (SUTURE) ×1
SUT SILK 3 0 SH CR/8 (SUTURE) ×3 IMPLANT
SUT SILK 3-0 18XBRD TIE 12 (SUTURE) ×2 IMPLANT
SYS LAPSCP GELPORT 120MM (MISCELLANEOUS)
SYSTEM LAPSCP GELPORT 120MM (MISCELLANEOUS) IMPLANT
TAPE CLOTH SURG 4X10 WHT LF (GAUZE/BANDAGES/DRESSINGS) ×3 IMPLANT
TAPE CLOTH SURG 6X10 WHT LF (GAUZE/BANDAGES/DRESSINGS) ×3 IMPLANT
TOWEL OR 17X24 6PK STRL BLUE (TOWEL DISPOSABLE) ×3 IMPLANT
TOWEL OR 17X26 10 PK STRL BLUE (TOWEL DISPOSABLE) ×3 IMPLANT
TRAY FOLEY CATH 14FRSI W/METER (CATHETERS) ×3 IMPLANT
TRAY LAPAROSCOPIC (CUSTOM PROCEDURE TRAY) ×3 IMPLANT
TRAY PROCTOSCOPIC FIBER OPTIC (SET/KITS/TRAYS/PACK) ×6 IMPLANT
TROCAR XCEL 12X100 BLDLESS (ENDOMECHANICALS) IMPLANT
TROCAR XCEL BLUNT TIP 100MML (ENDOMECHANICALS) IMPLANT
TROCAR XCEL NON-BLD 11X100MML (ENDOMECHANICALS) IMPLANT
TROCAR XCEL NON-BLD 5MMX100MML (ENDOMECHANICALS) ×3 IMPLANT
TUBE CONNECTING 12X1/4 (SUCTIONS) ×9 IMPLANT
VACUUM HOSE 7/8X10 W/ WAND (MISCELLANEOUS) ×3 IMPLANT
WATER STERILE IRR 1000ML POUR (IV SOLUTION) IMPLANT
YANKAUER SUCT BULB TIP NO VENT (SUCTIONS) ×9 IMPLANT

## 2012-02-19 NOTE — Transfer of Care (Signed)
Immediate Anesthesia Transfer of Care Note  Patient: Timothy [name]  Procedure(s) Performed: Procedure(s) (LRB): LAPAROSCOPIC SIGMOID COLECTOMY (N/A) FULGURATION ANAL WART (N/A)  Patient Location: PACU  Anesthesia Type: General  Level of Consciousness: awake, alert  and oriented  Airway & Oxygen Therapy: Patient Spontanous Breathing and Patient connected to face mask oxygen  Post-op Assessment: Report given to PACU RN, Post -op Vital signs reviewed and stable, Patient moving all extremities X 4 and Patient able to stick tongue midline  Post vital signs: Reviewed and stable  Complications: No apparent anesthesia complications

## 2012-02-19 NOTE — Anesthesia Preprocedure Evaluation (Addendum)
Anesthesia Evaluation  Patient identified by MRN, date of birth, ID band Patient awake    Reviewed: Allergy & Precautions, H&P , NPO status , Patient's Chart, lab work & pertinent test results  Airway Mallampati: II  Neck ROM: full    Dental  (+) Dental Advidsory Given   Pulmonary sleep apnea , Current Smoker,          Cardiovascular  Prinzmetal's angina   Neuro/Psych  Headaches, Anxiety    GI/Hepatic GERD-  Controlled,(+)     substance abuse  alcohol use,   Endo/Other    Renal/GU      Musculoskeletal   Abdominal   Peds  Hematology   Anesthesia Other Findings   Reproductive/Obstetrics                         Anesthesia Physical Anesthesia Plan  ASA: III  Anesthesia Plan: General   Post-op Pain Management:    Induction: Intravenous  Airway Management Planned: Oral ETT  Additional Equipment:   Intra-op Plan:   Post-operative Plan: Extubation in OR  Informed Consent: I have reviewed the patients History and Physical, chart, labs and discussed the procedure including the risks, benefits and alternatives for the proposed anesthesia with the patient or authorized representative who has indicated his/her understanding and acceptance.   Dental Advisory Given  Plan Discussed with: CRNA, Surgeon and Anesthesiologist  Anesthesia Plan Comments:       Anesthesia Quick Evaluation

## 2012-02-19 NOTE — Anesthesia Procedure Notes (Signed)
Procedure Name: Intubation Date/Time: 02/19/2012 8:32 AM Performed by: Carmela Rima Pre-anesthesia Checklist: Patient identified, Timeout performed, Emergency Drugs available, Suction available and Patient being monitored Patient Re-evaluated:Patient Re-evaluated prior to inductionOxygen Delivery Method: Circle system utilized Preoxygenation: Pre-oxygenation with 100% oxygen Intubation Type: IV induction Ventilation: Mask ventilation without difficulty Laryngoscope Size: Mac and 4 Grade View: Grade I Tube type: Oral Tube size: 7.5 mm Number of attempts: 2 (Mac 3 too short) Placement Confirmation: ETT inserted through vocal cords under direct vision,  breath sounds checked- equal and bilateral and positive ETCO2 Secured at: 23 cm Tube secured with: Tape Dental Injury: Teeth and Oropharynx as per pre-operative assessment

## 2012-02-19 NOTE — Anesthesia Postprocedure Evaluation (Signed)
Anesthesia Post Note  Patient: Timothy [name]  Procedure(s) Performed: Procedure(s) (LRB): LAPAROSCOPIC SIGMOID COLECTOMY (N/A) FULGURATION ANAL WART (N/A)  Anesthesia type: General  Patient location: PACU  Post pain: Pain level controlled and Adequate analgesia  Post assessment: Post-op Vital signs reviewed, Patient's Cardiovascular Status Stable, Respiratory Function Stable, Patent Airway and Pain level controlled  Last Vitals:  Filed Vitals:   02/19/12 1115  BP: 169/91  Pulse: 100  Temp: 36.4 C  Resp: 15    Post vital signs: Reviewed and stable  Level of consciousness: awake, alert  and oriented  Complications: No apparent anesthesia complications

## 2012-02-19 NOTE — OR Nursing (Signed)
1st procedure end at 1052.

## 2012-02-19 NOTE — Addendum Note (Signed)
Addendum  created 02/19/12 1229 by Avedis Bevis A Jayme Mednick, MD   Modules edited:Anesthesia Medication Administration    

## 2012-02-19 NOTE — Addendum Note (Signed)
Addendum  created 02/19/12 1229 by Kerby Nora, MD   Modules edited:Anesthesia Medication Administration

## 2012-02-19 NOTE — Interval H&P Note (Signed)
History and Physical Interval Note:  02/19/2012 7:23 AM  Timothy [name]  has presented today for surgery, with the diagnosis of sigmoid diverticulitis and anal condyloma   The various methods of treatment have been discussed with the patient and family. After consideration of risks, benefits and other options for treatment, the patient has consented to  Procedure(s) (LRB): LAPAROSCOPIC SIGMOID COLECTOMY (N/A) FULGURATION ANAL WART (N/A) as a surgical intervention .  The patient's history has been reviewed, patient examined, no change in status, stable for surgery.  I have reviewed the patient's chart and labs.  Questions were answered to the patient's satisfaction.     TOTH III,[name]

## 2012-02-19 NOTE — H&P (View-Only) (Signed)
Subjective:     Patient ID: Timothy [name], male   DOB: 06/14/1966, 45 y.o.   MRN: [id number]  HPI The patient is a 45-year-old white male who we have been following for her episodes of diverticulitis. He had 3 episodes of diverticulitis earlier this year which responded very slowly to antibiotics. He would like to avoid a colostomy and is very concerned that he will have another episode. Because of this we have him scheduled for elective sigmoid colectomy. Since his last visit he has developed some discomfort in his perirectal area. He also notes bleeding with his bowel movements.  Review of Systems  Constitutional: Negative.   HENT: Negative.   Eyes: Negative.   Respiratory: Negative.   Cardiovascular: Negative.   Gastrointestinal: Positive for blood in stool and rectal pain.  Genitourinary: Negative.   Musculoskeletal: Negative.   Skin: Negative.   Neurological: Negative.   Hematological: Negative.   Psychiatric/Behavioral: Negative.        Objective:   Physical Exam  Constitutional: He is oriented to person, place, and time. He appears well-developed and well-nourished.  HENT:  Head: Normocephalic and atraumatic.  Eyes: Conjunctivae and EOM are normal. Pupils are equal, round, and reactive to light.  Neck: Normal range of motion. Neck supple.  Cardiovascular: Normal rate, regular rhythm and normal heart sounds.   Pulmonary/Chest: Effort normal and breath sounds normal.  Abdominal: Soft. Bowel sounds are normal. He exhibits no mass. There is no tenderness.  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 No evidence of hemorrhoid disease  Musculoskeletal: Normal range of motion.  Neurological: He is alert and oriented to person, place, and time.  Skin: Skin is warm and dry.  Psychiatric: He has a normal mood and affect. His behavior is normal.       Assessment:     The patient has 2 problems at this point. The first is recurrent  sigmoid diverticulitis for which he is scheduled for elective sigmoid colectomy in the near future. 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 I believe this will need to be destroyed in the operating room. I've discussed with him in detail the risks and benefits of the operation to treat both of these problems as well as some appendical aspects and he understands and wishes to proceed.    Plan:     Plan for laparoscopic assisted sigmoid colectomy as well as destruction of anal condyloma      

## 2012-02-19 NOTE — Progress Notes (Signed)
Received from PACU, alert oriented x4. Moved self from stretcher to bed without difficulty. Denies nausea, c/o abd pain 5/10.

## 2012-02-19 NOTE — Op Note (Signed)
02/19/2012  11:29 AM  PATIENT:  Timothy [name]  46 y.o. male  PRE-OPERATIVE DIAGNOSIS:  sigmoid diverticulitis, anal condyloma   POST-OPERATIVE DIAGNOSIS:  sigmoid diverticulitis, anal condyloma  PROCEDURE:  Procedure(s) (LRB): LAPAROSCOPIC SIGMOID COLECTOMY (N/A) FULGURATION ANAL WART (N/A)  SURGEON:  Surgeon(s) and Role:    * Robyne Askew, MD - Primary  PHYSICIAN ASSISTANT:   ASSISTANTS: Dr. Derrell Lolling   ANESTHESIA:   general  EBL:  Total I/O In: 2100 [I.V.:2100] Out: 475 [Urine:400; Blood:75]  BLOOD ADMINISTERED:none  DRAINS: none   LOCAL MEDICATIONS USED:  MARCAINE     SPECIMEN:  Source of Specimen:  sigmoid colon and anal condyloma  DISPOSITION OF SPECIMEN:  PATHOLOGY  COUNTS:  YES  TOURNIQUET:  * No tourniquets in log *  DICTATION: .Dragon Dictation After informed consent was obtained patient was brought to the operating room and placed in the supine position on the operating table. After adequate induction of general anesthesia the patient was moved in the lithotomy position. The abdomen was prepped with ChloraPrep and the perineum was prepped with Betadine. The ChloraPrep was allowed to dry and the patient was then draped in the usual sterile manner. A site was chosen in the right upper quadrant for accessing the abdomen with a Optiview port. This area was infiltrate with quarter percent Marcaine. A 5 mm Optiview port was then used to bluntly dissect through the layers of the abdominal wall under direct vision until we had access to the abdominal cavity. The abdomen was insufflated with carbon dioxide without difficulty. 2 other 5 mm ports were placed in the right upper and lower abdominal wall under direct vision. The sigmoid colon was visualized. The abdomen was generally inspected no other abnormalities were noted. There was a thickened portion of sigmoid colon that was identified. The left and sigmoid colon were mobilized by incising the retroperitoneal attachment  along the white line of Toldt. This was done with the Harmonic scalpel. Once the left and sigmoid colon appeared mobile enough we were then able to make a small lower midline incision below the umbilicus with a 10 blade knife. This incision was carried through the skin and subcutaneous tissue sharply with the electrocautery until the fascia of the anterior bowel wall was encountered. The fascia was also incised with the electrocautery until we reached the peritoneum the peritoneum was incised with electrocautery and we had then access to the abdominal cavity. A wound protector was deployed. The sigmoid colon seemed mobile enough. We chose a site above and below the area of thickening where there did not appear to be any diverticuli. We placed Allen and covered clamps across the colon at each of these points and then divided the colon between the 2 clamps. A curved bowel clamp was placed above the upper division. The mesentery to the sigmoid colon was then taken down sharply with the LigaSure. A larger vessel mesentery was controlled with a 2-0 silk figure-of-eight stitch. The edges of the colon up estimated each other very well without any tension and there appeared to be good blood supply. An anastomosis was then created between the upper and lower segments using interrupted 2-0 silk full thickness stitches. The back wall was created first with the knots on the inside. The thought wall was then created second again with the knots on the inside except for the last several stitches which were thrown in a simple manner with the knots on the outside. Once we were satisfied with the anastomosis then Dr.  Derrell Lolling performed a rigid sigmoidoscopy and insufflated the rectum and colon. We placed saline in the pelvis and observe the anastomosis and there did not appear to be any air leak or bubbling. The colon and rectum were then evacuated of air. Gloves and gowns were changed. The abdomen was then irrigated with copious  amounts of saline. The area was examined and found to be hemostatic. Next the fascia of the anterior bowel wall was then closed with 2 running #1 double-stranded looped PDS sutures. The subcutaneous tissue was irrigated copiously with saline and Betadine. The skin was closed with staples and sterile dressings were applied. Next the legs were elevated. The patient had circumferential condyloma around the anus. A couple of these areas were excised sharply with Metzenbaum scissors and the tissue was sent to pathology for further evaluation. The rest of the condyloma was destroyed using the electrocautery. Dibucaine ointment was then applied along with sterile dressings. The patient tolerated the procedure well. At the end of the case the needle sponge and instrument counts were correct. The patient was then awakened and taken to recovery in stable condition.  PLAN OF CARE: Admit to inpatient   PATIENT DISPOSITION:  PACU - hemodynamically stable.   Delay start of Pharmacological VTE agent (>24hrs) due to surgical blood loss or risk of bleeding: no

## 2012-02-19 NOTE — OR Nursing (Signed)
2nd procedure start at 1102.

## 2012-02-20 ENCOUNTER — Encounter (HOSPITAL_COMMUNITY): Payer: Self-pay | Admitting: General Surgery

## 2012-02-20 LAB — BASIC METABOLIC PANEL
BUN: 7 mg/dL (ref 6–23)
CO2: 26 mEq/L (ref 19–32)
Chloride: 102 mEq/L (ref 96–112)
Creatinine, Ser: 0.6 mg/dL (ref 0.50–1.35)
Glucose, Bld: 135 mg/dL — ABNORMAL HIGH (ref 70–99)

## 2012-02-20 LAB — CBC
HCT: 38.7 % — ABNORMAL LOW (ref 39.0–52.0)
Hemoglobin: 12.8 g/dL — ABNORMAL LOW (ref 13.0–17.0)
MCV: 85.6 fL (ref 78.0–100.0)
RBC: 4.52 MIL/uL (ref 4.22–5.81)
RDW: 17.6 % — ABNORMAL HIGH (ref 11.5–15.5)
WBC: 13.2 10*3/uL — ABNORMAL HIGH (ref 4.0–10.5)

## 2012-02-20 MED ORDER — SODIUM CHLORIDE 0.9 % IJ SOLN: 9.0000 mL | INTRAMUSCULAR | Status: DC | PRN

## 2012-02-20 MED ORDER — HYDROMORPHONE 0.3 MG/ML IV SOLN
INTRAVENOUS | Status: DC
  Administered 2012-02-20: via INTRAVENOUS
  Administered 2012-02-21: 3.9 mg via INTRAVENOUS
  Administered 2012-02-21: 2.4 mg via INTRAVENOUS
  Administered 2012-02-21: 10:00:00 via INTRAVENOUS
  Administered 2012-02-21: 2.34 mg via INTRAVENOUS
  Administered 2012-02-21 (×2): 2.7 mg via INTRAVENOUS
  Administered 2012-02-22: 05:00:00 via INTRAVENOUS
  Administered 2012-02-22: 2.65 mg via INTRAVENOUS
  Administered 2012-02-22: 2.49 mg via INTRAVENOUS
  Administered 2012-02-22: 3.3 mg via INTRAVENOUS
  Administered 2012-02-22: 3 mg via INTRAVENOUS
  Filled 2012-02-20 (×4): qty 25

## 2012-02-20 MED ORDER — DIPHENHYDRAMINE HCL 50 MG/ML IJ SOLN: 12.5000 mg | Freq: Four times a day (QID) | INTRAMUSCULAR | Status: DC | PRN

## 2012-02-20 MED ORDER — ONDANSETRON HCL 4 MG/2ML IJ SOLN: 4.0000 mg | Freq: Four times a day (QID) | INTRAMUSCULAR | Status: DC | PRN

## 2012-02-20 MED ORDER — DIPHENHYDRAMINE HCL 12.5 MG/5ML PO ELIX
12.5000 mg | ORAL_SOLUTION | Freq: Four times a day (QID) | ORAL | Status: DC | PRN
  Filled 2012-02-20: qty 5

## 2012-02-20 MED ORDER — NALOXONE HCL 0.4 MG/ML IJ SOLN: 0.4000 mg | INTRAMUSCULAR | Status: DC | PRN

## 2012-02-20 NOTE — Progress Notes (Signed)
1 Day Post-Op  Subjective: No complaints other than some mild soreness  Objective: Vital signs in last 24 hours: Temp:  [97.5 F (36.4 C)-98.4 F (36.9 C)] 98.4 F (36.9 C) (08/01 0537) Pulse Rate:  [100-112] 104  (08/01 0537) Resp:  [14-20] 18  (08/01 0537) BP: (142-179)/(80-92) 142/92 mmHg (08/01 0537) SpO2:  [93 %-99 %] 99 % (08/01 0537) Last BM Date: 03/20/12  Intake/Output from previous day: 07/31 0701 - 08/01 0700 In: 3625 [P.O.:240; I.V.:3385] Out: 2225 [Urine:2150; Blood:75] Intake/Output this shift:    GI: soft, mild tenderness. few bs. dressings clean  Lab Results:   [facility] 02/20/12 0605  WBC 13.2*  HGB 12.8*  HCT 38.7*  PLT 247   BMET  Basename 02/20/12 0605  NA 137  K 3.8  CL 102  CO2 26  GLUCOSE 135*  BUN 7  CREATININE 0.60  CALCIUM 8.9   PT/INR No results found for this basename: LABPROT:2,INR:2 in the last 72 hours ABG No results found for this basename: PHART:2,PCO2:2,PO2:2,HCO3:2 in the last 72 hours  Studies/Results: No results found.  Anti-infectives: Anti-infectives     Start     Dose/Rate Route Frequency Ordered Stop   02/18/12 1115   cefOXitin (MEFOXIN) 2 g in dextrose 5 % 50 mL IVPB        2 g 100 mL/hr over 30 Minutes Intravenous 60 min pre-op 02/18/12 1115 02/19/12 0835   02/18/12 1111   ertapenem (INVANZ) 1 g in sodium chloride 0.9 % 50 mL IVPB        1 g 100 mL/hr over 30 Minutes Intravenous 60 min pre-op 02/18/12 1111 02/19/12 0835          Assessment/Plan: s/p Procedure(s) (LRB): LAPAROSCOPIC SIGMOID COLECTOMY (N/A) FULGURATION ANAL WART (N/A) Continue clears for now Ambulate Entereg  LOS: 1 day    TOTH III,Malyia Moro S 02/20/2012

## 2012-02-20 NOTE — Progress Notes (Signed)
Notified physician of patient complaint of itching with Morphine PCA even after Benadryl. Received new orders to change to dilaudid PCA and continue benadryl.

## 2012-02-21 ENCOUNTER — Telehealth: Payer: Self-pay | Admitting: Family Medicine

## 2012-02-21 LAB — BASIC METABOLIC PANEL
BUN: 7 mg/dL (ref 6–23)
Creatinine, Ser: 0.66 mg/dL (ref 0.50–1.35)
GFR calc Af Amer: >90 mL/min (ref >90)
GFR calc non Af Amer: >90 mL/min (ref >90)
Glucose, Bld: 117 mg/dL — ABNORMAL HIGH (ref 70–99)

## 2012-02-21 LAB — CBC
HCT: 37.9 % — ABNORMAL LOW (ref 39.0–52.0)
Hemoglobin: 12.2 g/dL — ABNORMAL LOW (ref 13.0–17.0)
MCHC: 32.2 g/dL (ref 30.0–36.0)
MCV: 87.1 fL (ref 78.0–100.0)
RDW: 17.4 % — ABNORMAL HIGH (ref 11.5–15.5)

## 2012-02-21 MED ORDER — METOPROLOL TARTRATE 25 MG PO TABS
25.0000 mg | ORAL_TABLET | Freq: Two times a day (BID) | ORAL | Status: DC
  Administered 2012-02-21 – 2012-02-23 (×5): 25 mg via ORAL
  Filled 2012-02-21 (×7): qty 1

## 2012-02-21 NOTE — Progress Notes (Signed)
2 Days Post-Op  Subjective: No complaints. No flatus yet  Objective: Vital signs in last 24 hours: Temp:  [97.9 F (36.6 C)-98.9 F (37.2 C)] 98.8 F (37.1 C) (08/02 0620) Pulse Rate:  [91-111] 91  (08/02 0620) Resp:  [18-22] 18  (08/02 0756) BP: (136-166)/(76-98) 136/76 mmHg (08/02 0620) SpO2:  [93 %-99 %] 94 % (08/02 0756) Weight:  [184 lb (83.462 kg)] 184 lb (83.462 kg) (08/01 0954) Last BM Date: 02/18/12  Intake/Output from previous day: 08/01 0701 - 08/02 0700 In: 2415.8 [P.O.:840; I.V.:1575.8] Out: 2050 [Urine:2050] Intake/Output this shift:    GI: soft, mild tenderness. dressing clean. few bs  Lab Results:   Basename 02/21/12 0530 02/20/12 0605  WBC 10.0 13.2*  HGB 12.2* 12.8*  HCT 37.9* 38.7*  PLT 211 247   BMET  Basename 02/21/12 0530 02/20/12 0605  NA 138 137  K 3.8 3.8  CL 103 102  CO2 23 26  GLUCOSE 117* 135*  BUN 7 7  CREATININE 0.66 0.60  CALCIUM 9.0 8.9   PT/INR No results found for this basename: LABPROT:2,INR:2 in the last 72 hours ABG No results found for this basename: PHART:2,PCO2:2,PO2:2,HCO3:2 in the last 72 hours  Studies/Results: No results found.  Anti-infectives: Anti-infectives     Start     Dose/Rate Route Frequency Ordered Stop   02/18/12 1115   cefOXitin (MEFOXIN) 2 g in dextrose 5 % 50 mL IVPB        2 g 100 mL/hr over 30 Minutes Intravenous 60 min pre-op 02/18/12 1115 02/19/12 0835   02/18/12 1111   ertapenem (INVANZ) 1 g in sodium chloride 0.9 % 50 mL IVPB        1 g 100 mL/hr over 30 Minutes Intravenous 60 min pre-op 02/18/12 1111 02/19/12 0835          Assessment/Plan: s/p Procedure(s) (LRB): LAPAROSCOPIC SIGMOID COLECTOMY (N/A) FULGURATION ANAL WART (N/A) stay with clears Ambulate entereg  LOS: 2 days    TOTH III,Kia Stavros S 02/21/2012

## 2012-02-21 NOTE — Progress Notes (Signed)
Patient used approximately 1.5 mg of Morphine PCA prior to switching to Dilaudid PCA.

## 2012-02-21 NOTE — Telephone Encounter (Signed)
Just had surgery and is asking for Dr [name] to come see him.  6th floor - [facility] rm- 8

## 2012-02-21 NOTE — Progress Notes (Signed)
Pt's BP - 162/99, P - 105.  MD notified and new order received.  Will continue to monitor.  Timothy [name]

## 2012-02-22 MED ORDER — OXYCODONE-ACETAMINOPHEN 5-325 MG PO TABS
1.0000 | ORAL_TABLET | ORAL | Status: DC | PRN
Start: 2012-02-22 — End: 2012-02-23
  Administered 2012-02-22 – 2012-02-23 (×5): 2 via ORAL
  Filled 2012-02-22 (×5): qty 2

## 2012-02-22 MED ORDER — PANTOPRAZOLE SODIUM 40 MG PO TBEC
40.0000 mg | DELAYED_RELEASE_TABLET | Freq: Every day | ORAL | Status: DC
  Administered 2012-02-22: 40 mg via ORAL
  Filled 2012-02-22: qty 1

## 2012-02-22 NOTE — Progress Notes (Signed)
Patient ID: Timothy [name], male   DOB: 1965/09/22, 46 y.o.   MRN: [id number] 3 Days Post-Op  Subjective: No complaints this morning. Having flatus, no bowel movements. Some crampy pain with gas. No nausea.  Objective: Vital signs in last 24 hours: Temp:  [97.9 F (36.6 C)-98.6 F (37 C)] 98.3 F (36.8 C) (08/03 0548) Pulse Rate:  [82-105] 82  (08/03 0548) Resp:  [14-20] 18  (08/03 0758) BP: (127-162)/(84-99) 127/85 mmHg (08/03 0548) SpO2:  [93 %-98 %] 95 % (08/03 0758) Last BM Date: 02/18/12  Intake/Output from previous day: 08/02 0701 - 08/03 0700 In: 2331.2 [P.O.:840; I.V.:1491.2] Out: 4775 [Urine:4775] Intake/Output this shift:    General appearance: alert and no distress GI: normal findings: soft, non-tender Incision/Wound: dressings clean and dry  Lab Results:   Basename 02/21/12 0530 02/20/12 0605  WBC 10.0 13.2*  HGB 12.2* 12.8*  HCT 37.9* 38.7*  PLT 211 247   BMET  Basename 02/21/12 0530 02/20/12 0605  NA 138 137  K 3.8 3.8  CL 103 102  CO2 23 26  GLUCOSE 117* 135*  BUN 7 7  CREATININE 0.66 0.60  CALCIUM 9.0 8.9     Studies/Results: No results found.  Anti-infectives: Anti-infectives     Start     Dose/Rate Route Frequency Ordered Stop   02/18/12 1115   cefOXitin (MEFOXIN) 2 g in dextrose 5 % 50 mL IVPB        2 g 100 mL/hr over 30 Minutes Intravenous 60 min pre-op 02/18/12 1115 02/19/12 0835   02/18/12 1111   ertapenem (INVANZ) 1 g in sodium chloride 0.9 % 50 mL IVPB        1 g 100 mL/hr over 30 Minutes Intravenous 60 min pre-op 02/18/12 1111 02/19/12 0835          Assessment/Plan: s/p Procedure(s): LAPAROSCOPIC SIGMOID COLECTOMY FULGURATION ANAL WART Doing well postoperatively. Advanced regular diet.   LOS: 3 days    Hollyn Stucky T 02/22/2012

## 2012-02-23 MED ORDER — OXYCODONE-ACETAMINOPHEN 5-325 MG PO TABS: 1.0000 | ORAL_TABLET | ORAL | Status: AC | PRN

## 2012-02-23 NOTE — Discharge Summary (Signed)
   Patient ID: Timothy [name] [id number] 45 y.o. 12-26-1965  02/19/2012  Discharge date and time: 02/23/2012   Admitting Physician: Chevis Pretty  Discharge Physician: Glenna Fellows T  Admission Diagnoses: sigmoid diverticulitis  Discharge Diagnoses: Diverticulitis  Operations: Procedure(s): LAPAROSCOPIC SIGMOID COLECTOMY FULGURATION ANAL WART  Admission Condition: fair  Discharged Condition: good  Indication for Admission: patient is a 46 year old male with chronic recurring diverticulitis admitted electively for laparoscopic assisted sigmoid colectomy.  Hospital Course: patient underwent an uneventful laparoscopic sigmoid colectomy. His postoperative course was uneventful without complication. He was started on a clear liquid diet on the second postoperative day. He tolerated this well and his diet was advanced to regular. On the fourth postoperative day he is tolerating his diet and it had a bowel movement with some blood. He is ambulatory without discomfort on oral pain medications. He is afebrile with a benign abdomen and wounds were healing without evidence of infection. He is felt ready for discharge.  Disposition: Home  Patient Instructions:   [name], [name]  Home Medication Instructions [id number]   Printed on:02/23/12 0844  Medication Information                    hydrocortisone (ANUSOL-HC) 2.5 % rectal cream Place 1 application rectally 2 (two) times daily.           ibuprofen (ADVIL,MOTRIN) 800 MG tablet Take 800 mg by mouth every 8 (eight) hours as needed.           clonazePAM (KLONOPIN) 0.5 MG tablet Take 0.5 mg by mouth daily as needed.           oxyCODONE-acetaminophen (PERCOCET/ROXICET) 5-325 MG per tablet Take 1-2 tablets by mouth every 4 (four) hours as needed.             Activity: no heavy lifting for 4 weeks Diet: regular diet Wound Care: May shower  Follow-up:  With Dr. Carolynne Edouard in 1 week.  Signed: Mariella Saa MD, FACS  02/23/2012,  8:44 AM

## 2012-02-23 NOTE — Progress Notes (Signed)
Patient ID: Timothy [name], male   DOB: [birth year], 46 y.o.   MRN: [id number] 4 Days Post-Op  Subjective: No C/O. Tol diet, + BM  Objective: Vital signs in last 24 hours: Temp:  [97.9 F (36.6 C)-98.4 F (36.9 C)] 98 F (36.7 C) (08/04 0605) Pulse Rate:  [78-102] 78  (08/04 0605) Resp:  [18-20] 20  (08/04 0605) BP: (122-153)/(69-95) 122/69 mmHg (08/04 0605) SpO2:  [94 %-96 %] 96 % (08/04 0605) Last BM Date: 02/18/12  Intake/Output from previous day: 08/03 0701 - 08/04 0700 In: 720 [P.O.:720] Out: 1900 [Urine:1900] Intake/Output this shift:    General appearance: alert and no distress GI: normal findings: soft, non-tender Incision/Wound: [place] and dry  Lab Results:   Basename 02/21/12 0530  WBC 10.0  HGB 12.2*  HCT 37.9*  PLT 211   BMET  Basename 02/21/12 0530  NA 138  K 3.8  CL 103  CO2 23  GLUCOSE 117*  BUN 7  CREATININE 0.66  CALCIUM 9.0     Studies/Results: No results found.  Anti-infectives: Anti-infectives     Start     Dose/Rate Route Frequency Ordered Stop   02/18/12 1115   cefOXitin (MEFOXIN) 2 g in dextrose 5 % 50 mL IVPB        2 g 100 mL/hr over 30 Minutes Intravenous 60 min pre-op 02/18/12 1115 02/19/12 0835   02/18/12 1111   ertapenem (INVANZ) 1 g in sodium chloride 0.9 % 50 mL IVPB        1 g 100 mL/hr over 30 Minutes Intravenous 60 min pre-op 02/18/12 1111 02/19/12 0835          Assessment/Plan: s/p Procedure(s): LAPAROSCOPIC SIGMOID COLECTOMY FULGURATION ANAL WART Doing well OK for discharge today   LOS: 4 days    Keisy Strickler T 02/23/2012

## 2012-02-24 ENCOUNTER — Telehealth (INDEPENDENT_AMBULATORY_CARE_PROVIDER_SITE_OTHER): Payer: Self-pay | Admitting: General Surgery

## 2012-02-24 ENCOUNTER — Telehealth (INDEPENDENT_AMBULATORY_CARE_PROVIDER_SITE_OTHER): Payer: Self-pay

## 2012-02-24 NOTE — Telephone Encounter (Signed)
Patient called after being released over weekend. He needs an appointment for staple removal either Thursday or Friday. I told patient I would let his nurse decide a time that is better for her schedule and have her call him back with date and time. Please call patient at either number on file.

## 2012-02-24 NOTE — Telephone Encounter (Signed)
Called pt's home and cell and talked with his wife to let her know that he has an appt with [place] on Friday 8/9 at 1:40 to remove his staples.  She was fine with this.

## 2012-02-24 NOTE — Telephone Encounter (Signed)
I spoke with Timothy [name] this evening. He originally wanted to see me for anxiety management prior to his surgery, but I was not available. I also could not visit him in the hospital, because I was out of town. He has an appointment on Friday, and I look forward to see him then to discuss his anxiety.

## 2012-02-25 ENCOUNTER — Telehealth (INDEPENDENT_AMBULATORY_CARE_PROVIDER_SITE_OTHER): Payer: Self-pay | Admitting: General Surgery

## 2012-02-25 ENCOUNTER — Ambulatory Visit (INDEPENDENT_AMBULATORY_CARE_PROVIDER_SITE_OTHER): Payer: PRIVATE HEALTH INSURANCE | Admitting: General Surgery

## 2012-02-25 ENCOUNTER — Encounter (INDEPENDENT_AMBULATORY_CARE_PROVIDER_SITE_OTHER): Payer: Self-pay | Admitting: General Surgery

## 2012-02-25 VITALS — BP 142/64 | HR 140 | Temp 97.4°F | Resp 16 | Ht 70.0 in | Wt 174.6 lb

## 2012-02-25 DIAGNOSIS — Z09 Encounter for follow-up examination after completed treatment for conditions other than malignant neoplasm: Secondary | ICD-10-CM

## 2012-02-25 NOTE — Progress Notes (Signed)
Subjective:     Patient ID: Timothy [name], male   DOB: 10/06/1965, 46 y.o.   MRN: [id number]  HPI 69 yom s/p lap sigmoid colectomy for diverticular disease.  He did well and was recently discharged.  He had a couple of days where he did not pass flatus or bm at home and this was associated with increased pain last night.  This pain is much better after a bm and flatus today.  He does not really have any complaints now.  His shoulders have hurt as well and this is better after a hot shower.  He comes in today to make sure all is ok.  He denies fevers, drainage from wound, n/v.  Review of Systems     Objective:   Physical Exam Staples in place, wounds without infection, some ecchymosis at extraction site    Assessment:     S/p lap sigmoid    Plan:     I think this is normal postop in nature.  He doesn't have vitals that are abnormal, exam is as expected.  I think follow up with Carolynne Edouard on Friday reasonable. We discussed signs to watch for.

## 2012-02-25 NOTE — Telephone Encounter (Signed)
Spoke with pt's wife and she was calling to let me know that her husband is still having pain but not to the same severity as last night.  Dr. Carolynne Edouard said it would be okay for him to be put on urgent office today because he needs to be examined by someone.  I put Timothy [name] on today's urgent office for 4:15.  They are aware of this and prefer this.

## 2012-02-25 NOTE — Telephone Encounter (Signed)
Spoke with wife. She says he was doing well until last night when he developed severe pain out of the blue. i told her i think the safest thing to do is to go to the emergency department. She will let us know

## 2012-02-27 ENCOUNTER — Telehealth: Payer: Self-pay | Admitting: Family Medicine

## 2012-02-27 ENCOUNTER — Inpatient Hospital Stay (HOSPITAL_COMMUNITY)
Admission: EM | Admit: 2012-02-27 | Discharge: 2012-03-10 | DRG: 329 | Disposition: A | Discharge status: Home Health | Payer: Medicaid Other | Attending: General Surgery | Admitting: General Surgery

## 2012-02-27 ENCOUNTER — Encounter (HOSPITAL_COMMUNITY): Payer: Self-pay | Admitting: Anesthesiology

## 2012-02-27 ENCOUNTER — Emergency Department (HOSPITAL_COMMUNITY): Payer: Medicaid Other

## 2012-02-27 ENCOUNTER — Inpatient Hospital Stay (HOSPITAL_COMMUNITY): Payer: Medicaid Other

## 2012-02-27 ENCOUNTER — Inpatient Hospital Stay (HOSPITAL_COMMUNITY): Payer: Medicaid Other | Admitting: Anesthesiology

## 2012-02-27 ENCOUNTER — Encounter (HOSPITAL_COMMUNITY): Payer: Self-pay

## 2012-02-27 ENCOUNTER — Encounter (HOSPITAL_COMMUNITY)
Admission: EM | Disposition: A | Discharge status: Home Health | Payer: Self-pay | Source: Home / Self Care | Attending: General Surgery

## 2012-02-27 DIAGNOSIS — K5732 Diverticulitis of large intestine without perforation or abscess without bleeding: Secondary | ICD-10-CM | POA: Diagnosis present

## 2012-02-27 DIAGNOSIS — K5792 Diverticulitis of intestine, part unspecified, without perforation or abscess without bleeding: Secondary | ICD-10-CM

## 2012-02-27 DIAGNOSIS — G4730 Sleep apnea, unspecified: Secondary | ICD-10-CM | POA: Diagnosis present

## 2012-02-27 DIAGNOSIS — K929 Disease of digestive system, unspecified: Principal | ICD-10-CM | POA: Diagnosis present

## 2012-02-27 DIAGNOSIS — Y838 Other surgical procedures as the cause of abnormal reaction of the patient, or of later complication, without mention of misadventure at the time of the procedure: Secondary | ICD-10-CM | POA: Diagnosis present

## 2012-02-27 DIAGNOSIS — K560 Paralytic ileus: Secondary | ICD-10-CM | POA: Diagnosis not present

## 2012-02-27 DIAGNOSIS — K219 Gastro-esophageal reflux disease without esophagitis: Secondary | ICD-10-CM | POA: Diagnosis present

## 2012-02-27 DIAGNOSIS — K651 Peritoneal abscess: Secondary | ICD-10-CM | POA: Diagnosis present

## 2012-02-27 DIAGNOSIS — T8131XA Disruption of external operation (surgical) wound, not elsewhere classified, initial encounter: Secondary | ICD-10-CM | POA: Diagnosis not present

## 2012-02-27 DIAGNOSIS — K668 Other specified disorders of peritoneum: Secondary | ICD-10-CM

## 2012-02-27 DIAGNOSIS — F17200 Nicotine dependence, unspecified, uncomplicated: Secondary | ICD-10-CM | POA: Diagnosis present

## 2012-02-27 DIAGNOSIS — R000 Tachycardia, unspecified: Secondary | ICD-10-CM | POA: Diagnosis present

## 2012-02-27 DIAGNOSIS — F411 Generalized anxiety disorder: Secondary | ICD-10-CM | POA: Diagnosis present

## 2012-02-27 DIAGNOSIS — H409 Unspecified glaucoma: Secondary | ICD-10-CM | POA: Diagnosis present

## 2012-02-27 DIAGNOSIS — R109 Unspecified abdominal pain: Secondary | ICD-10-CM

## 2012-02-27 HISTORY — PX: LAPAROTOMY: SHX154

## 2012-02-27 HISTORY — PX: COLOSTOMY: SHX63

## 2012-02-27 HISTORY — PX: COLON RESECTION: SHX5231

## 2012-02-27 LAB — COMPREHENSIVE METABOLIC PANEL
BUN: 23 mg/dL (ref 6–23)
Calcium: 9.9 mg/dL (ref 8.4–10.5)
Creatinine, Ser: 0.65 mg/dL (ref 0.50–1.35)
GFR calc Af Amer: >90 mL/min (ref >90)
Glucose, Bld: 151 mg/dL — ABNORMAL HIGH (ref 70–99)
Total Protein: 7.2 g/dL (ref 6.0–8.3)

## 2012-02-27 LAB — URINALYSIS, ROUTINE W REFLEX MICROSCOPIC
Leukocytes, UA: NEGATIVE
Nitrite: NEGATIVE
Specific Gravity, Urine: 1.029 (ref 1.005–1.030)
Urobilinogen, UA: 1 mg/dL (ref 0.0–1.0)
pH: 6 (ref 5.0–8.0)

## 2012-02-27 LAB — CBC WITH DIFFERENTIAL/PLATELET
Eosinophils Absolute: 0 10*3/uL (ref 0.0–0.7)
Eosinophils Relative: 0 % (ref 0–5)
Hemoglobin: 13.9 g/dL (ref 13.0–17.0)
Lymphs Abs: 0.6 10*3/uL — ABNORMAL LOW (ref 0.7–4.0)
MCH: 29.3 pg (ref 26.0–34.0)
MCV: 86.3 fL (ref 78.0–100.0)
Monocytes Absolute: 1.7 10*3/uL — ABNORMAL HIGH (ref 0.1–1.0)
Monocytes Relative: 14 % — ABNORMAL HIGH (ref 3–12)
Platelets: 439 10*3/uL — ABNORMAL HIGH (ref 150–400)
RBC: 4.74 MIL/uL (ref 4.22–5.81)

## 2012-02-27 LAB — URINE MICROSCOPIC-ADD ON

## 2012-02-27 LAB — PROTIME-INR: Prothrombin Time: 14.1 seconds (ref 11.6–15.2)

## 2012-02-27 LAB — APTT: aPTT: 32 seconds (ref 24–37)

## 2012-02-27 SURGERY — LAPAROTOMY, EXPLORATORY
Anesthesia: General | Site: Abdomen | Wound class: Dirty or Infected

## 2012-02-27 MED ORDER — POTASSIUM CHLORIDE IN NACL 20-0.45 MEQ/L-% IV SOLN
INTRAVENOUS | Status: DC
  Administered 2012-02-27 – 2012-02-29 (×4): via INTRAVENOUS
  Filled 2012-02-27 (×8): qty 1000

## 2012-02-27 MED ORDER — 0.9 % SODIUM CHLORIDE (POUR BTL) OPTIME
TOPICAL | Status: DC | PRN
  Administered 2012-02-27: 9000 mL
  Administered 2012-02-27: 3000 mL

## 2012-02-27 MED ORDER — HYDROMORPHONE HCL PF 1 MG/ML IJ SOLN
INTRAMUSCULAR | Status: AC
  Filled 2012-02-27: qty 1

## 2012-02-27 MED ORDER — HYDROMORPHONE 0.3 MG/ML IV SOLN
INTRAVENOUS | Status: AC
  Filled 2012-02-27: qty 25

## 2012-02-27 MED ORDER — HYDROMORPHONE 0.3 MG/ML IV SOLN
INTRAVENOUS | Status: DC
  Administered 2012-02-27: 17:00:00 via INTRAVENOUS
  Administered 2012-02-28: 4.95 mg via INTRAVENOUS
  Administered 2012-02-28: 13:00:00 via INTRAVENOUS
  Administered 2012-02-28: 3.9 mg via INTRAVENOUS
  Administered 2012-02-28: 2.4 mg via INTRAVENOUS
  Administered 2012-02-28: 1.8 mg via INTRAVENOUS
  Administered 2012-02-29: 07:00:00 via INTRAVENOUS
  Administered 2012-02-29: 4.2 mg via INTRAVENOUS
  Administered 2012-02-29: 2.4 mg via INTRAVENOUS
  Administered 2012-02-29: 3.3 mg via INTRAVENOUS
  Filled 2012-02-27 (×4): qty 25

## 2012-02-27 MED ORDER — PROPOFOL 10 MG/ML IV EMUL
INTRAVENOUS | Status: DC | PRN
  Administered 2012-02-27: 100 mg via INTRAVENOUS

## 2012-02-27 MED ORDER — VECURONIUM BROMIDE 10 MG IV SOLR
INTRAVENOUS | Status: DC | PRN
  Administered 2012-02-27: 2 mg via INTRAVENOUS

## 2012-02-27 MED ORDER — NALOXONE HCL 0.4 MG/ML IJ SOLN: 0.4000 mg | INTRAMUSCULAR | Status: DC | PRN

## 2012-02-27 MED ORDER — PIPERACILLIN-TAZOBACTAM 3.375 G IVPB
3.3750 g | Freq: Three times a day (TID) | INTRAVENOUS | Status: DC
  Administered 2012-02-27 – 2012-03-01 (×8): 3.375 g via INTRAVENOUS
  Filled 2012-02-27 (×10): qty 50

## 2012-02-27 MED ORDER — ONDANSETRON HCL 4 MG/2ML IJ SOLN
4.0000 mg | Freq: Four times a day (QID) | INTRAMUSCULAR | Status: DC | PRN
  Filled 2012-02-27: qty 2

## 2012-02-27 MED ORDER — SODIUM CHLORIDE 0.9 % IV SOLN
INTRAVENOUS | Status: DC
  Administered 2012-02-27: 10:00:00 via INTRAVENOUS

## 2012-02-27 MED ORDER — LABETALOL HCL 5 MG/ML IV SOLN
INTRAVENOUS | Status: DC | PRN
  Administered 2012-02-27: 2.5 mg via INTRAVENOUS
  Administered 2012-02-27 (×2): 5 mg via INTRAVENOUS

## 2012-02-27 MED ORDER — ROCURONIUM BROMIDE 100 MG/10ML IV SOLN
INTRAVENOUS | Status: DC | PRN
  Administered 2012-02-27: 50 mg via INTRAVENOUS

## 2012-02-27 MED ORDER — ENOXAPARIN SODIUM 40 MG/0.4ML ~~LOC~~ SOLN
40.0000 mg | SUBCUTANEOUS | Status: DC
  Administered 2012-02-28 – 2012-03-09 (×10): 40 mg via SUBCUTANEOUS
  Filled 2012-02-27 (×13): qty 0.4

## 2012-02-27 MED ORDER — DIPHENHYDRAMINE HCL 12.5 MG/5ML PO ELIX
12.5000 mg | ORAL_SOLUTION | Freq: Four times a day (QID) | ORAL | Status: DC | PRN
  Filled 2012-02-27: qty 5

## 2012-02-27 MED ORDER — LABETALOL HCL 5 MG/ML IV SOLN
INTRAVENOUS | Status: AC
  Administered 2012-02-27: 5 mg via INTRAVENOUS
  Filled 2012-02-27: qty 4

## 2012-02-27 MED ORDER — LABETALOL HCL 5 MG/ML IV SOLN
5.0000 mg | INTRAVENOUS | Status: DC | PRN
  Administered 2012-02-27 (×5): 5 mg via INTRAVENOUS

## 2012-02-27 MED ORDER — MIDAZOLAM HCL 5 MG/5ML IJ SOLN
INTRAMUSCULAR | Status: DC | PRN
  Administered 2012-02-27: 2 mg via INTRAVENOUS

## 2012-02-27 MED ORDER — SODIUM CHLORIDE 0.9 % IJ SOLN: 9.0000 mL | INTRAMUSCULAR | Status: DC | PRN

## 2012-02-27 MED ORDER — HETASTARCH-ELECTROLYTES 6 % IV SOLN
INTRAVENOUS | Status: DC | PRN
  Administered 2012-02-27: 15:00:00 via INTRAVENOUS

## 2012-02-27 MED ORDER — SUFENTANIL CITRATE 50 MCG/ML IV SOLN
INTRAVENOUS | Status: DC | PRN
  Administered 2012-02-27: 10 ug via INTRAVENOUS
  Administered 2012-02-27: 20 ug via INTRAVENOUS
  Administered 2012-02-27: 10 ug via INTRAVENOUS
  Administered 2012-02-27: 20 ug via INTRAVENOUS
  Administered 2012-02-27: 10 ug via INTRAVENOUS
  Administered 2012-02-27: 20 ug via INTRAVENOUS

## 2012-02-27 MED ORDER — HYDROMORPHONE HCL PF 1 MG/ML IJ SOLN
0.2500 mg | INTRAMUSCULAR | Status: DC | PRN
  Administered 2012-02-27 (×4): 0.5 mg via INTRAVENOUS

## 2012-02-27 MED ORDER — DIPHENHYDRAMINE HCL 50 MG/ML IJ SOLN
12.5000 mg | Freq: Four times a day (QID) | INTRAMUSCULAR | Status: DC | PRN
  Administered 2012-02-27: 12.5 mg via INTRAVENOUS
  Filled 2012-02-27: qty 1

## 2012-02-27 MED ORDER — ONDANSETRON HCL 4 MG PO TABS: 4.0000 mg | ORAL_TABLET | Freq: Four times a day (QID) | ORAL | Status: DC | PRN

## 2012-02-27 MED ORDER — LABETALOL HCL 5 MG/ML IV SOLN
INTRAVENOUS | Status: AC
  Filled 2012-02-27: qty 4

## 2012-02-27 MED ORDER — ONDANSETRON HCL 4 MG/2ML IJ SOLN
4.0000 mg | Freq: Four times a day (QID) | INTRAMUSCULAR | Status: DC | PRN
  Administered 2012-02-27 – 2012-03-03 (×2): 4 mg via INTRAVENOUS
  Filled 2012-02-27: qty 2

## 2012-02-27 MED ORDER — ONDANSETRON HCL 4 MG/2ML IJ SOLN
4.0000 mg | Freq: Once | INTRAMUSCULAR | Status: AC
  Administered 2012-02-27: 4 mg via INTRAVENOUS
  Filled 2012-02-27: qty 2

## 2012-02-27 MED ORDER — IOHEXOL 300 MG/ML  SOLN
100.0000 mL | Freq: Once | INTRAMUSCULAR | Status: AC | PRN
  Administered 2012-02-27: 100 mL via INTRAVENOUS

## 2012-02-27 MED ORDER — SUCCINYLCHOLINE CHLORIDE 20 MG/ML IJ SOLN
INTRAMUSCULAR | Status: DC | PRN
  Administered 2012-02-27: 140 mg via INTRAVENOUS

## 2012-02-27 MED ORDER — PIPERACILLIN-TAZOBACTAM 3.375 G IVPB
3.3750 g | INTRAVENOUS | Status: AC
  Administered 2012-02-27: 3.375 g via INTRAVENOUS
  Filled 2012-02-27: qty 50

## 2012-02-27 MED ORDER — LACTATED RINGERS IV SOLN
INTRAVENOUS | Status: DC
  Administered 2012-02-27: 14:00:00 via INTRAVENOUS

## 2012-02-27 MED ORDER — ONDANSETRON HCL 4 MG/2ML IJ SOLN
INTRAMUSCULAR | Status: AC
  Filled 2012-02-27: qty 2

## 2012-02-27 MED ORDER — LIDOCAINE HCL (CARDIAC) 20 MG/ML IV SOLN
INTRAVENOUS | Status: DC | PRN
  Administered 2012-02-27: 100 mg via INTRAVENOUS

## 2012-02-27 MED ORDER — LACTATED RINGERS IV SOLN
INTRAVENOUS | Status: DC | PRN
  Administered 2012-02-27 (×3): via INTRAVENOUS

## 2012-02-27 MED ORDER — ONDANSETRON HCL 4 MG/2ML IJ SOLN
INTRAMUSCULAR | Status: DC | PRN
  Administered 2012-02-27: 4 mg via INTRAVENOUS

## 2012-02-27 MED ORDER — ONDANSETRON HCL 4 MG/2ML IJ SOLN: 4.0000 mg | Freq: Once | INTRAMUSCULAR | Status: DC | PRN

## 2012-02-27 MED ORDER — HYDROMORPHONE HCL PF 1 MG/ML IJ SOLN
1.0000 mg | INTRAMUSCULAR | Status: DC | PRN
  Administered 2012-02-27 – 2012-03-01 (×12): 1 mg via INTRAVENOUS
  Filled 2012-02-27 (×13): qty 1

## 2012-02-27 MED ORDER — ONDANSETRON HCL 4 MG/2ML IJ SOLN
4.0000 mg | Freq: Once | INTRAMUSCULAR | Status: AC
  Administered 2012-02-27: 4 mg via INTRAVENOUS

## 2012-02-27 SURGICAL SUPPLY — 69 items
BLADE SURG 10 STRL SS (BLADE) ×2 IMPLANT
BLADE SURG ROTATE 9660 (MISCELLANEOUS) IMPLANT
CANISTER SUCTION 2500CC (MISCELLANEOUS) ×4 IMPLANT
CANISTER WOUND CARE 500ML ATS (WOUND CARE) ×2 IMPLANT
CHLORAPREP W/TINT 26ML (MISCELLANEOUS) ×2 IMPLANT
CLIP TI LARGE 6 (CLIP) IMPLANT
CLIP TI MEDIUM 6 (CLIP) IMPLANT
CLOTH BEACON ORANGE TIMEOUT ST (SAFETY) ×2 IMPLANT
COVER SURGICAL LIGHT HANDLE (MISCELLANEOUS) ×2 IMPLANT
DRAIN CHANNEL 19F RND (DRAIN) ×2 IMPLANT
DRAPE CIRCULAR ADH 35X35 STRL (DRAPES) IMPLANT
DRAPE LAPAROSCOPIC ABDOMINAL (DRAPES) ×2 IMPLANT
DRAPE UTILITY 15X26 W/TAPE STR (DRAPE) ×4 IMPLANT
DRAPE WARM FLUID 44X44 (DRAPE) ×2 IMPLANT
DRSG VAC ATS MED SENSATRAC (GAUZE/BANDAGES/DRESSINGS) ×2 IMPLANT
ELECT BLADE 6.5 EXT (BLADE) ×2 IMPLANT
ELECT REM PT RETURN 9FT ADLT (ELECTROSURGICAL) ×2
ELECTRODE REM PT RTRN 9FT ADLT (ELECTROSURGICAL) ×1 IMPLANT
EVACUATOR SILICONE 100CC (DRAIN) ×2 IMPLANT
GAUZE SPONGE 4X4 16PLY XRAY LF (GAUZE/BANDAGES/DRESSINGS) IMPLANT
GLOVE BIOGEL PI IND STRL 7.5 (GLOVE) ×1 IMPLANT
GLOVE BIOGEL PI INDICATOR 7.5 (GLOVE) ×1
GLOVE SURG SIGNA 7.5 PF LTX (GLOVE) ×4 IMPLANT
GOWN STRL NON-REIN LRG LVL3 (GOWN DISPOSABLE) ×6 IMPLANT
GOWN STRL REIN XL XLG (GOWN DISPOSABLE) ×2 IMPLANT
KIT BASIN OR (CUSTOM PROCEDURE TRAY) ×2 IMPLANT
KIT REMOVER STAPLE SKIN (MISCELLANEOUS) ×2 IMPLANT
KIT ROOM TURNOVER OR (KITS) ×2 IMPLANT
LIGASURE IMPACT 36 18CM CVD LR (INSTRUMENTS) ×2 IMPLANT
NS IRRIG 1000ML POUR BTL (IV SOLUTION) ×2 IMPLANT
PACK GENERAL/GYN (CUSTOM PROCEDURE TRAY) ×2 IMPLANT
PAD ARMBOARD 7.5X6 YLW CONV (MISCELLANEOUS) ×4 IMPLANT
RELOAD PROXIMATE TA60MM GREEN (ENDOMECHANICALS) ×2 IMPLANT
RELOAD STAPLER LINE PROX 60 GR (STAPLE) ×1 IMPLANT
SPECIMEN JAR X LARGE (MISCELLANEOUS) ×2 IMPLANT
SPONGE GAUZE 4X4 12PLY (GAUZE/BANDAGES/DRESSINGS) ×2 IMPLANT
SPONGE INTESTINAL PEANUT (DISPOSABLE) IMPLANT
SPONGE LAP 18X18 X RAY DECT (DISPOSABLE) ×2 IMPLANT
STAPLER PROXIMATE 75MM BLUE (STAPLE) ×2 IMPLANT
STAPLER RELOAD LINE PROX 60 GR (STAPLE) ×2
STAPLER VISISTAT 35W (STAPLE) ×2 IMPLANT
SUCTION POOLE TIP (SUCTIONS) ×2 IMPLANT
SUT ETHILON 2 0 FS 18 (SUTURE) ×2 IMPLANT
SUT NOVA 1 T20/GS 25DT (SUTURE) IMPLANT
SUT NOVA 1 T60/GS 26 4466 71 (SUTURE) ×4 IMPLANT
SUT NOVA NAB GS-21 0 18 T12 DT (SUTURE) IMPLANT
SUT PDS AB 1 CTX 36 (SUTURE) ×2 IMPLANT
SUT PDS AB 4-0 SH 27 (SUTURE) IMPLANT
SUT PROLENE 2 0 CT2 30 (SUTURE) ×4 IMPLANT
SUT SILK 2 0 (SUTURE) ×2
SUT SILK 2 0 REEL (SUTURE) IMPLANT
SUT SILK 2 0 SH CR/8 (SUTURE) ×2 IMPLANT
SUT SILK 2-0 18XBRD TIE 12 (SUTURE) ×2 IMPLANT
SUT SILK 3 0 (SUTURE) ×1
SUT SILK 3 0 SH CR/8 (SUTURE) ×2 IMPLANT
SUT SILK 3-0 18XBRD TIE 12 (SUTURE) ×1 IMPLANT
SUT VIC AB 3-0 54X BRD REEL (SUTURE) ×2 IMPLANT
SUT VIC AB 3-0 BRD 54 (SUTURE) ×2
SUT VIC AB 3-0 SH 18 (SUTURE) ×2 IMPLANT
SUT VICRYL 2 0 18  TIES (SUTURE) ×1
SUT VICRYL 2 0 18 TIES (SUTURE) ×1 IMPLANT
SWAB COLLECTION DEVICE MRSA (MISCELLANEOUS) IMPLANT
TAPE CLOTH SURG 6X10 WHT LF (GAUZE/BANDAGES/DRESSINGS) ×2 IMPLANT
TOWEL OR 17X24 6PK STRL BLUE (TOWEL DISPOSABLE) ×6 IMPLANT
TOWEL OR 17X26 10 PK STRL BLUE (TOWEL DISPOSABLE) ×2 IMPLANT
TRAY FOLEY CATH 14FRSI W/METER (CATHETERS) IMPLANT
TUBE ANAEROBIC SPECIMEN COL (MISCELLANEOUS) IMPLANT
UNDERPAD 30X30 INCONTINENT (UNDERPADS AND DIAPERS) IMPLANT
WATER STERILE IRR 1000ML POUR (IV SOLUTION) ×2 IMPLANT

## 2012-02-27 NOTE — Telephone Encounter (Signed)
Wanted to let Dr Jani Gravel know that he had to have emergency surgery this morning and he is back in [place] tower - 6th floor.

## 2012-02-27 NOTE — Transfer of Care (Signed)
Immediate Anesthesia Transfer of Care Note  Patient: Timothy [name]  Procedure(s) Performed: Procedure(s) (LRB): EXPLORATORY LAPAROTOMY (N/A) COLOSTOMY (N/A) COLON RESECTION (N/A)  Patient Location: PACU  Anesthesia Type: General  Level of Consciousness: awake and alert   Airway & Oxygen Therapy: Patient Spontanous Breathing and Patient connected to face mask oxygen  Post-op Assessment: Report given to PACU RN and Post -op Vital signs reviewed and stable  Post vital signs: Reviewed and stable  Complications: No apparent anesthesia complications

## 2012-02-27 NOTE — H&P (Signed)
Chief Complaint: Abdominal pain with Nausea and vomiting HPI: The patient is a 46 year old white male who has a history of episodes of diverticulitis. He had 3 episodes of diverticulitis earlier this year which responded very slowly to antibiotics. Because of this he was scheduled for and did undergo a laproscopic sigmoid colectomy on 02/19/12. His hospital stay was uneventful and he was discharged home on 02/23/12 with instruction to follow up our office. He has been seen recently by Dr. Dwain Sarna on 02/25/12 for c/o of abdominal pain, it was felt that after examination that this represented a normal postoperative course and he was advised to maintain his appointment with Dr.Toth this coming Friday. In the interim he developed N/V last night along with increasing abdominal pain, reportedly vomitted 4 styrofoam cups worth of yellowish liquid. Abdominal  pain is diffuse in nature, non localized. He does report some chills last night, and diaphoresis, no fever. Last BM was yesterday, last food intake was last evening. We are asked to see him in the ED for examination and recommendations. Labs are pending, we will obtain a CT of the abdomen and pelvis with oral and IV contrast. Plain abdominal films demonstrated pneumoperitoneum which the radiologist felt was larger than was normal postoperatively, and he could also not r/o possible perforation.     Past Medical History  Diagnosis Date  . Hypoglycemia   . GERD (gastroesophageal reflux disease)   . Diverticulosis     By colonoscopy June 2005  . Lower GI bleed     June 2005. Presumably secondary to diverticulosis.  . Active smoker   . Anemia   . Anxiety   . Alcohol abuse   . Hemorrhoids   . Family history of anesthesia complication     Mother N/V  . Bronchitis     history  . Headache     with hypoglycemia  . Cellulitis     Hx    . Glaucoma syndrome     does not use eye drops, "They burn".  . Sleep apnea     witness apnea by wife.  . Elevated  CK 3/13  . H/O Prinzmetal angina     has not experienced in > 1 month.    Past Surgical History  Procedure Date  . Appendectomy 1982  . Elbow surgery     left, pins inserted  age of 7  . Wisdom tooth extraction   . Colon surgery 02/19/2012    SIGMOID  . Wart fulguration 02/19/2012    Procedure: FULGURATION ANAL WART;  Surgeon: Robyne Askew, MD;  Location: [facility] OR;  Service: General;  Laterality: N/A;  Destroy  Anal Condyloma     Family History  Problem Relation Age of Onset  . Diabetes Mother   . Parkinsonism Mother   . Depression Mother   . Alcohol abuse Father   . Hypertension Father   . Breast cancer Maternal Aunt   . Stroke Neg Hx   . Heart disease Neg Hx   . Anxiety disorder Father   . Ulcers Father   . Colon polyps Father   . Diabetes Mother   . Diabetes Maternal Grandmother    Social History:  reports that he has been smoking Cigarettes.  He has a 67.5 pack-year smoking history. His smokeless tobacco use includes Chew. He reports that he drinks alcohol. He reports that he does not use illicit drugs.  Allergies:  Allergies  Allergen Reactions  . Darvocet (Propoxyphene-Acetaminophen) Hives  . Morphine And Related  Itching     (Not in a hospital admission)  Results for orders placed during the hospital encounter of 02/27/12 (from the past 48 hour(s))  CBC WITH DIFFERENTIAL     Status: Abnormal   Collection Time   02/27/12  9:40 AM      Component Value Range Comment   WBC 12.7 (*) 4.0 - 10.5 K/uL    RBC 4.74  4.22 - 5.81 MIL/uL    Hemoglobin 13.9  13.0 - 17.0 g/dL    HCT 45.4  09.8 - 11.9 %    MCV 86.3  78.0 - 100.0 fL    MCH 29.3  26.0 - 34.0 pg    MCHC 34.0  30.0 - 36.0 g/dL    RDW 14.7 (*) 82.9 - 15.5 %    Platelets 439 (*) 150 - 400 K/uL    Neutrophils Relative 82 (*) 43 - 77 %    Neutro Abs 10.4 (*) 1.7 - 7.7 K/uL    Lymphocytes Relative 5 (*) 12 - 46 %    Lymphs Abs 0.6 (*) 0.7 - 4.0 K/uL    Monocytes Relative 14 (*) 3 - 12 %    Monocytes Absolute  1.7 (*) 0.1 - 1.0 K/uL    Eosinophils Relative 0  0 - 5 %    Eosinophils Absolute 0.0  0.0 - 0.7 K/uL    Basophils Relative 0  0 - 1 %    Basophils Absolute 0.0  0.0 - 0.1 K/uL   COMPREHENSIVE METABOLIC PANEL     Status: Abnormal   Collection Time   02/27/12  9:40 AM      Component Value Range Comment   Sodium 137  135 - 145 mEq/L    Potassium 4.2  3.5 - 5.1 mEq/L    Chloride 98  96 - 112 mEq/L    CO2 25  19 - 32 mEq/L    Glucose, Bld 151 (*) 70 - 99 mg/dL    BUN 23  6 - 23 mg/dL    Creatinine, Ser 5.62  0.50 - 1.35 mg/dL    Calcium 9.9  8.4 - 13.0 mg/dL    Total Protein 7.2  6.0 - 8.3 g/dL    Albumin 2.4 (*) 3.5 - 5.2 g/dL    AST 18  0 - 37 U/L    ALT 34  0 - 53 U/L    Alkaline Phosphatase 111  39 - 117 U/L    Total Bilirubin 0.3  0.3 - 1.2 mg/dL    GFR calc non Af Amer >90  >90 mL/min    GFR calc Af Amer >90  >90 mL/min    Dg Abd Acute W/chest  02/27/2012  *RADIOLOGY REPORT*  Clinical Data: Vomiting, post partial colectomy 8 days ago.  ACUTE ABDOMEN SERIES (ABDOMEN 2 VIEW & CHEST 1 VIEW)  Comparison: CT 11/04/2011  Findings: Heart is normal size.  Lungs clear.  No effusions.  Large pneumoperitoneum noted.  This is more than expected for postop state greater than 1 week.  I cannot exclude bowel perforation.  No evidence of bowel obstruction.  No free air.  No organomegaly or acute bony abnormality.  IMPRESSION: A large pneumoperitoneum, greater than expected following surgery 8 days ago.  Cannot exclude bowel perforation.  These results were called to Dr. Effie Shy at the time of interpretation.  Original Report Authenticated By: Cyndie Chime, M.D.    Review of Systems  Constitutional: Positive for chills and diaphoresis. Negative for fever, weight loss and  malaise/fatigue.  HENT: Negative for hearing loss, ear pain, nosebleeds, congestion, sore throat, neck pain, tinnitus and ear discharge.   Eyes: Negative.   Respiratory: Negative.  Negative for cough, hemoptysis, sputum production,  shortness of breath, wheezing and stridor.   Cardiovascular: Negative.  Negative for chest pain, palpitations, orthopnea, claudication, leg swelling and PND.  Gastrointestinal: Positive for nausea, vomiting, abdominal pain and constipation. Negative for heartburn, diarrhea, blood in stool and melena.  Genitourinary: Negative.   Musculoskeletal: Positive for back pain. Negative for myalgias, joint pain and falls.  Skin: Negative.   Neurological: Negative.  Negative for weakness and headaches.  Endo/Heme/Allergies: Negative for environmental allergies and polydipsia. Does not bruise/bleed easily.  Psychiatric/Behavioral: Negative for depression, suicidal ideas, hallucinations, memory loss and substance abuse. The patient is nervous/anxious. The patient does not have insomnia.     Blood pressure 156/99, pulse 146, resp. rate 18, SpO2 98.00%. Physical Exam  Constitutional: He is oriented to person, place, and time. He appears well-developed and well-nourished. No distress.  HENT:  Head: Normocephalic and atraumatic.  Mouth/Throat: No oropharyngeal exudate.  Eyes: Conjunctivae and EOM are normal. Pupils are equal, round, and reactive to light. Right eye exhibits no discharge. Left eye exhibits no discharge. No scleral icterus.  Neck: Normal range of motion. Neck supple. No JVD present. No tracheal deviation present. No thyromegaly present.  Cardiovascular: Normal rate, regular rhythm, normal heart sounds and intact distal pulses.  Exam reveals no gallop and no friction rub.   No murmur heard. Respiratory: Effort normal and breath sounds normal. No stridor. No respiratory distress. He has no wheezes. He has no rales. He exhibits no tenderness.  GI: Soft. Bowel sounds are normal. He exhibits distension. He exhibits no mass. There is tenderness. There is no rebound and no guarding.  Musculoskeletal: He exhibits no edema and no tenderness.  Lymphadenopathy:    He has no cervical adenopathy.    Neurological: He is alert and oriented to person, place, and time.  Skin: Skin is warm and dry. No rash noted. He is not diaphoretic. No erythema. No pallor.  Psychiatric: He has a normal mood and affect.     Assessment/Plan 1.S/p lap sigmoid colectomy - P. Carolynne Edouard - 02/20/2012  Path - benign diverticular disease. 2.Post operative pneumoperitoneum by xray done 02/27/12. R/o perforation. 3. Hx of diverticulitis with hx of GI bleed secondary to #3 4. GERD 5. Anemia 6. Anxiety 7. Hx of ETOH abuse 8. Sleep apnea 9. H/O prinzmetal angina 10. Hemoroids.   Plan:  1. CT of abdomen/pelvis with oral and IV contrast 2. Admit to med-surg floor. 3. NPO 4. IVF 5. DVT and Gerd prophylaxis.  Blenda Mounts 02/27/2012, 10:48 AM  Patient has never really done well since surgery and represented today to the [facility] with N/V, poor appetite, but minimal abdominal pain. CT scan shows probable leak at the sigmoid colon anastomosis. Discussed findings with patient and wife.  WBC - 12,700 - 02/27/2012 Will need exploration with colostomy.  I discussed indications and complications with the patient and his wife.  I discussed the need for a colostomy, but that this would most likely be temporary.  He'll probably be in the ICU post op.  His wound will be open. Morphine made him itch, but used dilaudid last hospitalization without problems. Discussed by phone with Dr. Carolynne Edouard, who will first assist. To OR today.  Ovidio Kin, MD, [facility] Pager: [phone] Office phone:  [phone]

## 2012-02-27 NOTE — Preoperative (Signed)
Beta Blockers   Reason not to administer Beta Blockers:Not Applicable 

## 2012-02-27 NOTE — Op Note (Signed)
NAMEORRIS, PERIN NO.:  [id number]  MEDICAL RECORD NO.:  [id number]  LOCATION:  2304                         FACILITY:  [facility]  PHYSICIAN:  Sandria Bales. Ezzard Standing, M.D.  DATE OF BIRTH:  1966-06-11  DATE OF PROCEDURE:  02/27/2012                               OPERATIVE REPORT  PREOPERATIVE DIAGNOSIS:  Pneumoperitoneum, probable leak from recent sigmoid colon surgery.  POSTOPERATIVE DIAGNOSIS:  Leak from sigmoid colon anastomosis, intra-abdominal abscesses.  PROCEDURES:  Exploratory laparotomy with sigmoid colon resection (at anastomosis) and end left colon colostomy(LUQ) and Hartmann pouch.  SURGEON:  Sandria Bales. Ezzard Standing, MD  FIRS ASSISTANT:  Ollen Gross. Vernell Morgans, MD  ANESTHESIA:  General endotracheal.  Supervised by Dr. Arta Bruce.  ESTIMATED BLOOD LOSS:  100 mL.  Drains: The wound had a VAC placed.  INDICATION FOR PROCEDURE:  Mr. Morozov is a 46 year old white male who is a patient of Dr. Mat Carne.  He had a sigmoid colectomy for diverticular disease on February 20, 2012, by Dr. Ailene Ards. Carolynne Edouard.  He is re- presented to the [facility] Emergency Room today with nausea, vomiting, and poor oral intake for evaluation.  Evaluations, including an abdominal/pelvic CT, revealed probable leak at his sigmoid colon anastomosis with pneumoperitoneum and appearent intra- abdominal abscesses.  I discussed with the patient and his wife about proceeding with surgery today for abdominal exploration and probable colostomy.  The risks of surgery includes bleeding, colostomy, other bowel problems, and unspecified medical problems postoperatively.  OPERATIVE NOTE:  The patient was taken to room #1 where he underwent a general anesthesia supervised by Dr. Arta Bruce.  He already been given Zosyn as antibiotic.  A time-out was held and surgical checklist run.  His abdomen was prepped with Betadine and draped.  Staples from his prior incisions were removed.  His abdomen was then opened up  cutting through his old suture line and getting into the abdominal cavity.  On opening the abdominal cavity, he had fecal contamination with multiple interloop abscesses.  Cultures were obtained.  I had to extend the incision up cranially about 5 cm above the umbilicus and inferiorly down to the pubic area about 3 cm.  I identified the sigmoid colon anastomosis which on the anterior wall had about a 2 cm disruption.  I divided the sigmoid/left colon proximal by 3 or 4 cm to the anastomosis with a TA-60 green load stapler and went distally about 3 or 4 cm with a TA 60 green load stapler.  I took the mesentery down with either a finger fracture or the LigaSure.  The resected sigmoid colon/anastomosis was sent to Pathology.    After controlling the source of the contamination, we irrigated the abdomen with about 11 L of saline total. I ran the small bowel from ligament of Treitz to the terminal ileum.  He had multiple interloop abscesses.  I tried to fracture and open these up as best as possible.  We irrigated over the right lobe of the liver where he had some contamination.   We irrigated the left upper quadrant where he had contamination.  The NG tube was checked and in good position.  There was no evidence  of any injury to the stomach, small bowel, or liver.  The left colon already been partially mobilized at the prior operation, but I continued to mobilize the colon proximally to close to the splenic flexure.  But I was able to get enough mobility of the left colon to get this up to the anterior abdominal wall to bring a colostomy.  I made a 2.5 cm hole in the left upper quadrant of the abdomen for the colostomy.  I cut down to the fascia and made an opening in the abdominal wall that permitted about 3 finger breadths.  I brought the left colonthrough the anterior abdominal wall without any tension.  The colostomy was tacked intra-abdominally with 3-0 Vicryl sutures.  The omentum was  brought down, covered out may be 2/3 of the open wound.  I closed the lower half of the wound that included the peritoneum with a running 2-0 Vicryl suture.  I then closed the abdominal wall fascia with 2 running #1 Novafil sutures.  I did put about 3 interrupted #1 PDS sutures as internal retention sutures.  Because of the gross contamination, I did not try to close the wound but put a VAC on the wound for negative pressure.  The colostomy was in left lower quadrant and was matured with interrupted 3-0 Vicryl sutures.  The midline abdominal wound was VAC'd. He tolerated the procedure well, had a blood loss of less than 100 cc.  There was no other obvious bowel entry at the end of surgery.  He was transferred to the recovery room in good condition.  We will put him in the ICU overnight because of the significant contamination and complication of surgery.  His sponge and needle counts were correct at the end of the case.   Sandria Bales. Ezzard Standing, M.D., FACS  DHN/MEDQ  D:  02/27/2012  T:  02/27/2012  Job:  [id number]  cc:   Mat Carne, MD Ollen Gross. Vernell Morgans, M.D.

## 2012-02-27 NOTE — ED Provider Notes (Signed)
History     CSN: [id number]  Arrival date & time 02/27/12  0906   First MD Initiated Contact with Patient 02/27/12 [phone]      Chief Complaint  Patient presents with  . Post-op Problem    (Consider location/radiation/quality/duration/timing/severity/associated sxs/prior treatment) HPI Comments: Timothy [name] is a 46 y.o. Male who is here for vomiting. He vomited about a quart of yellow mucus during the night. It is aggravated by drinking water. He is well. During the day yesterday. He saw a surgeon yesterday, in followup after his recent sigmoid colectomy. He denies fever, chills, cough, shortness of breath, or change in urine. He has been stooling, intermittently since hospital discharge, the last yesterday. His oral intake has been overall fair, since discharge.  He is using his regular medication. There no other aggravating or palliative factors.  The history is provided by the patient.    Past Medical History  Diagnosis Date  . Hypoglycemia   . GERD (gastroesophageal reflux disease)   . Diverticulosis     By colonoscopy June 2005  . Lower GI bleed     June 2005. Presumably secondary to diverticulosis.  . Active smoker   . Anemia   . Anxiety   . Alcohol abuse   . Hemorrhoids   . Family history of anesthesia complication     Mother N/V  . Bronchitis     history  . Headache     with hypoglycemia  . Cellulitis     Hx    . Glaucoma syndrome     does not use eye drops, "They burn".  . Sleep apnea     witness apnea by wife.  . Elevated CK 3/13  . H/O Prinzmetal angina     has not experienced in > 1 month.    Past Surgical History  Procedure Date  . Appendectomy 1982  . Elbow surgery     left, pins inserted  age of 7  . Wisdom tooth extraction   . Colon surgery 02/19/2012    SIGMOID  . Wart fulguration 02/19/2012    Procedure: FULGURATION ANAL WART;  Surgeon: Robyne Askew, MD;  Location: [facility] OR;  Service: General;  Laterality: N/A;  Destroy  Anal Condyloma      Family History  Problem Relation Age of Onset  . Diabetes Mother   . Parkinsonism Mother   . Depression Mother   . Alcohol abuse Father   . Hypertension Father   . Breast cancer Maternal Aunt   . Stroke Neg Hx   . Heart disease Neg Hx   . Anxiety disorder Father   . Ulcers Father   . Colon polyps Father   . Diabetes Mother   . Diabetes Maternal Grandmother     History  Substance Use Topics  . Smoking status: Current Everyday Smoker -- 2.5 packs/day for 27 years    Types: Cigarettes  . Smokeless tobacco: Current User    Types: Chew  . Alcohol Use: Yes     24 PK/ WEEKEND      Review of Systems  All other systems reviewed and are negative.    Allergies  Darvocet and Morphine and related  Home Medications   No current outpatient prescriptions on file.  BP 152/97  Pulse 112  Temp 98.8 F (37.1 C) (Oral)  Resp 21  SpO2 99%  Physical Exam  Nursing note and vitals reviewed. Constitutional: He is oriented to person, place, and time. He appears well-developed and well-nourished.  HENT:  Head: Normocephalic and atraumatic.  Right Ear: External ear normal.  Left Ear: External ear normal.  Eyes: Conjunctivae and EOM are normal. Pupils are equal, round, and reactive to light.  Neck: Normal range of motion and phonation normal. Neck supple.  Cardiovascular: Normal rate, regular rhythm, normal heart sounds and intact distal pulses.   Pulmonary/Chest: Effort normal and breath sounds normal. He exhibits no bony tenderness.  Abdominal: Soft. Normal appearance and bowel sounds are normal. He exhibits no distension and no mass. There is no tenderness. There is no guarding.       Healing, and stapled, surgical wounds.  Musculoskeletal: Normal range of motion.  Neurological: He is alert and oriented to person, place, and time. He has normal strength. No cranial nerve deficit or sensory deficit. He exhibits normal muscle tone. Coordination normal.  Skin: Skin is warm, dry  and intact.  Psychiatric: He has a normal mood and affect. His behavior is normal. Judgment and thought content normal.    ED Course  Procedures (including critical care time)  Labs Reviewed  CBC WITH DIFFERENTIAL - Abnormal; Notable for the following:    WBC 12.7 (*)     RDW 16.4 (*)     Platelets 439 (*)     Neutrophils Relative 82 (*)     Neutro Abs 10.4 (*)     Lymphocytes Relative 5 (*)     Lymphs Abs 0.6 (*)     Monocytes Relative 14 (*)     Monocytes Absolute 1.7 (*)     All other components within normal limits  COMPREHENSIVE METABOLIC PANEL - Abnormal; Notable for the following:    Glucose, Bld 151 (*)     Albumin 2.4 (*)     All other components within normal limits  URINALYSIS, ROUTINE W REFLEX MICROSCOPIC - Abnormal; Notable for the following:    Color, Urine AMBER (*)  BIOCHEMICALS MAY BE AFFECTED BY COLOR   Hgb urine dipstick MODERATE (*)     Bilirubin Urine SMALL (*)     Protein, ur 100 (*)     All other components within normal limits  URINE MICROSCOPIC-ADD ON - Abnormal; Notable for the following:    Casts GRANULAR CAST (*)     All other components within normal limits  APTT  PROTIME-INR  BODY FLUID CULTURE  BASIC METABOLIC PANEL  CBC   Ct Abdomen Pelvis W Contrast  02/27/2012  *RADIOLOGY REPORT*  Clinical Data: Recent abdominal surgery partial sigmoid colectomy 8 days ago.  Abdominal pain, vomiting, free air on plain films.  CT ABDOMEN AND PELVIS WITH CONTRAST  Technique:  Multidetector CT imaging of the abdomen and pelvis was performed following the standard protocol during bolus administration of intravenous contrast.  Contrast: OMNIPAQUE IOHEXOL 300 MG/ML  SOLN  Comparison: Plain films earlier today.  CT 11/04/2011  Findings: Dependent atelectasis in the lung bases.  No effusions. Heart is normal size.  Large amount of pneumoperitoneum in the abdomen and pelvis.  There appears to be anastomotic breakdown within the sigmoid colon.  Gas and stool is seen  extending from the lumen of the sigmoid colon anteriorly into the mesentery.  Extensive stranding around the segment of sigmoid colon.  There also gas, stool and fluid collections within the mesentery.  Gas collection within the midline of the pelvis measures 3.0 x 2.1 cm on image 70.  Gas and fluid collection in the left lower abdomen on image 53 measures 5.6 x 3.6 cm.  There is  marked inflammatory change, with wall thickening and stranding around multiple small bowel loops adjacent to this abscess, likely reactive.  Small bowel loops are also mildly dilated, possible ileus although some degree of obstruction within these inflamed loops cannot be excluded.  Extensive descending colonic and sigmoid diverticulosis.  Free fluid present in the pelvis and adjacent to the spleen.  Stomach is unremarkable.  Liver, spleen, gallbladder, pancreas, adrenals and kidneys are unremarkable.  No acute bony abnormality.  IMPRESSION: Findings suggest an anastomotic breakdown in the sigmoid colon. Gas and stool extends at the sigmoid colon into the mesentery.  At least two gas and fluid collections are seen in the mesentery, one the pelvis and one in the left lower abdomen.  Multiple small bowel loops in the left abdomen and pelvis are thick-walled and inflamed, likely secondarily inflamed.  Mildly prominent small bowel loops could reflect ileus although some degree of mechanical obstruction and no the severely inflamed left abdominal small bowel loop cannot be excluded.  Large pneumoperitoneum.  Free fluid around the spleen and in the cul-de-sac of the pelvis.  These results were discussed with Dr. Ezzard Standing at the time of interpretation.  Original Report Authenticated By: Cyndie Chime, M.D.   Dg Abd Acute W/chest  02/27/2012  *RADIOLOGY REPORT*  Clinical Data: Vomiting, post partial colectomy 8 days ago.  ACUTE ABDOMEN SERIES (ABDOMEN 2 VIEW & CHEST 1 VIEW)  Comparison: CT 11/04/2011  Findings: Heart is normal size.  Lungs clear.   No effusions.  Large pneumoperitoneum noted.  This is more than expected for postop state greater than 1 week.  I cannot exclude bowel perforation.  No evidence of bowel obstruction.  No free air.  No organomegaly or acute bony abnormality.  IMPRESSION: A large pneumoperitoneum, greater than expected following surgery 8 days ago.  Cannot exclude bowel perforation.  These results were called to Dr. Effie Shy at the time of interpretation.  Original Report Authenticated By: Cyndie Chime, M.D.     1. Pneumoperitoneum   2. Abdominal pain, acute       MDM  Large free air intra-abdominal c/w bowel leak. Pt needs surgical treatment.   Consult Dr Ezzard Standing        Flint Melter, MD 02/27/12 2118

## 2012-02-27 NOTE — Anesthesia Preprocedure Evaluation (Addendum)
Anesthesia Evaluation  Patient identified by MRN, date of birth, ID band Patient awake    Reviewed: Allergy & Precautions, H&P , NPO status , Patient's Chart, lab work & pertinent test results, reviewed documented beta blocker date and time   Airway Mallampati: I  Neck ROM: Full    Dental  (+) Teeth Intact   Pulmonary sleep apnea , Current Smoker,          Cardiovascular     Neuro/Psych Anxiety    GI/Hepatic GERD-  ,(+)     substance abuse  alcohol use, diverticulitis   Endo/Other    Renal/GU      Musculoskeletal   Abdominal   Peds  Hematology   Anesthesia Other Findings   Reproductive/Obstetrics                         Anesthesia Physical Anesthesia Plan  ASA: III  Anesthesia Plan: General   Post-op Pain Management:    Induction: Intravenous  Airway Management Planned: Oral ETT  Additional Equipment:   Intra-op Plan:   Post-operative Plan: Extubation in OR  Informed Consent: I have reviewed the patients History and Physical, chart, labs and discussed the procedure including the risks, benefits and alternatives for the proposed anesthesia with the patient or authorized representative who has indicated his/her understanding and acceptance.   Dental advisory given  Plan Discussed with: CRNA, Anesthesiologist and Surgeon  Anesthesia Plan Comments:         Anesthesia Quick Evaluation

## 2012-02-27 NOTE — Brief Op Note (Signed)
02/27/2012  4:14 PM  PATIENT:  Timothy [name], 46 y.o., male, MRN: [id number]  PREOP DIAGNOSIS:  Bowel Perforation  POSTOP DIAGNOSIS:   Leak at sigmoid colon anastomosis, intra-abdominal abscesses.  PROCEDURE:   Procedure(s): EXPLORATORY LAPAROTOMY, COLOSTOMY Sigmoid COLON RESECTION  SURGEON:   Ovidio Kin, M.D.  ASSISTANT:   Royston Sinner, M.D.  ANESTHESIA:   general  Ervin Knack, CRNA - CRNA Aubery Lapping, MD - Anesthesiologist Linde Gillis, CRNA - CRNA Kerby Nora, MD - Anesthesiologist  General  EBL:  100  ml  BLOOD ADMINISTERED: none  DRAINS: none, wound  LOCAL MEDICATIONS USED:   None  SPECIMEN:   Sigmoid colon/anastomosis  COUNTS CORRECT:  YES  INDICATIONS FOR PROCEDURE:  Timothy [name] is a 46 y.o. (DOB: March 14, 1966) white male whose primary care physician is Mat Carne, MD and comes for abdominal exploration for probable leak from recent sigmoid colon resection.   The indications and risks of the surgery were explained to the patient.  The risks include, but are not limited to, infection, bleeding, and nerve injury.  Note dictated to:   #[id number]  GI - Dr. Judie Petit. Russella Dar

## 2012-02-27 NOTE — Anesthesia Postprocedure Evaluation (Signed)
  Anesthesia Post-op Note  Patient: Timothy [name]  Procedure(s) Performed: Procedure(s) (LRB): EXPLORATORY LAPAROTOMY (N/A) COLOSTOMY (N/A) COLON RESECTION (N/A)  Patient Location: PACU  Anesthesia Type: General  Level of Consciousness: awake, alert  and oriented  Airway and Oxygen Therapy: Patient Spontanous Breathing and Patient connected to nasal cannula oxygen  Post-op Pain: mild  Post-op Assessment: Post-op Vital signs reviewed  Post-op Vital Signs: Reviewed  Complications: No apparent anesthesia complications

## 2012-02-27 NOTE — ED Notes (Signed)
Pt reports waking this am w/N/V, pt had a sigmoid colectomy February 19, 2012. LBM 02/26/2012. Pt reports vomiting 6 times lg amounts each time, pt reports vomiting starts after drinking anything.

## 2012-02-27 NOTE — ED Notes (Signed)
Pt transported to OR Holding with family at bedside.

## 2012-02-27 NOTE — ED Notes (Signed)
MD at bedside. 

## 2012-02-27 NOTE — Anesthesia Procedure Notes (Signed)
Procedure Name: Intubation Date/Time: 02/27/2012 2:20 PM Performed by: Glendora Score A Pre-anesthesia Checklist: Patient identified, Emergency Drugs available, Suction available and Patient being monitored Patient Re-evaluated:Patient Re-evaluated prior to inductionOxygen Delivery Method: Circle system utilized Preoxygenation: Pre-oxygenation with 100% oxygen Intubation Type: IV induction, Rapid sequence and Cricoid Pressure applied Laryngoscope Size: Miller and 2 Grade View: Grade I Tube type: Oral Tube size: 7.5 mm Number of attempts: 1 Airway Equipment and Method: Stylet Placement Confirmation: ETT inserted through vocal cords under direct vision,  positive ETCO2 and breath sounds checked- equal and bilateral Secured at: 23 cm Tube secured with: Tape Dental Injury: Teeth and Oropharynx as per pre-operative assessment

## 2012-02-28 ENCOUNTER — Inpatient Hospital Stay: Payer: Self-pay | Admitting: Family Medicine

## 2012-02-28 ENCOUNTER — Encounter (INDEPENDENT_AMBULATORY_CARE_PROVIDER_SITE_OTHER): Payer: Medicaid Other | Admitting: General Surgery

## 2012-02-28 ENCOUNTER — Encounter (HOSPITAL_COMMUNITY): Payer: Self-pay | Admitting: General Surgery

## 2012-02-28 LAB — CBC
Hemoglobin: 12.7 g/dL — ABNORMAL LOW (ref 13.0–17.0)
MCH: 28.9 pg (ref 26.0–34.0)
MCV: 87 fL (ref 78.0–100.0)
RBC: 4.39 MIL/uL (ref 4.22–5.81)

## 2012-02-28 LAB — BASIC METABOLIC PANEL
CO2: 24 mEq/L (ref 19–32)
Glucose, Bld: 130 mg/dL — ABNORMAL HIGH (ref 70–99)
Potassium: 4.9 mEq/L (ref 3.5–5.1)
Sodium: 135 mEq/L (ref 135–145)

## 2012-02-28 MED ORDER — SODIUM CHLORIDE 0.9 % IV BOLUS (SEPSIS)
1000.0000 mL | Freq: Once | INTRAVENOUS | Status: AC
  Administered 2012-02-28: 1000 mL via INTRAVENOUS

## 2012-02-28 MED ORDER — SODIUM CHLORIDE 0.9 % IV BOLUS (SEPSIS): 500.0000 mL | Freq: Once | INTRAVENOUS | Status: DC

## 2012-02-28 NOTE — Telephone Encounter (Signed)
Thank you for letting me know. I will go by and see the patient.

## 2012-02-28 NOTE — Care Management Note (Signed)
  Page 2 of 2   03/05/2012     11:42:05 AM   CARE MANAGEMENT NOTE 03/05/2012  Patient:  Timothy [name], Timothy [name]   Account Number:  [id number]  Date Initiated:  02/28/2012  Documentation initiated by:  AMERSON,JULIE  Subjective/Objective Assessment:   PT S/P EXP LAP WITH COLON RESECTION, COLOSTOMY AND WOUND VAC PLACEMENT.   PTA, PT INDEPENDENT, LIVES WITH WIFE AND KIDS.     Action/Plan:   MET WITH PT TO DISCUSS DC NEEDS.  PT STATES WIFE WILL AND PROVIDE CARE AT DISCHARGE.  WILL FOLLOW FOR HOME NEEDS AS PT PROGRESSES.   Anticipated DC Date:  03/03/2012   Anticipated DC Plan:  HOME W HOME HEALTH SERVICES  In-house referral  Financial Counselor      DC Planning Services  CM consult  Medication Assistance      Choice offered to / List presented to:  C-1 Patient           Status of service:  In process, will continue to follow Medicare Important Message given?   (If response is "NO", the following Medicare IM given date fields will be blank) Date Medicare IM given:   Date Additional Medicare IM given:    Discharge Disposition:  HOME W HOME HEALTH SERVICES  Per UR Regulation:  Reviewed for med. 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   Intraabdominal abscess with CT guided drain, 03/03/12 NG out   Plan: Full liquids, continue drain and wet to dry dressings. Labs tomorrow.   Spoke with patient and wife at bedside.  Both agreeable to home health RN at discharge.   MD if you agree please order. They have applied for Medicaid , working with Tamala Bari , Financial Counselor here at [facility] .  Patietn is eligible for 3 day  / $100 supply of medications from main pharmacy at discharge.  MD please write separate prescriptions at discharge.   Gave patient and wife  list of home health agencies for [facility],  picked  [facility] .  Face sheet is correct , patient request [facility] to call his wife's cell number which is [phone], before calling his cell which is [phone]  ( listed as home number).     Ronny Flurry RN BSN [phone]  03/03/12- 1400- Donn Pierini RN BSN [phone] Pt has abd. abscess- with perc. drain placement today- cont. bowel rest- NCM to cont. to follow for d/c needs and plannning.

## 2012-02-28 NOTE — Progress Notes (Addendum)
General Surgery Note  LOS: 1 day  POD# 1 and 8  Assessment/Plan: 1.  EXPLORATORY LAPAROTOMY, left colon COLOSTOMY, COLON RESECTION - D. Timothy [name] - 02/28/2012  Sigmoid resection - P. Toth - 02/20/2012  On Zoxyn  WBC - 12,300 - 02/28/2012  Looks good except tachycardic.  Will bolus with IVF.  Recheck labs in AM.  Wound with VAC - will start changing tomorrow, 8/10.  Transfer to step down.   2.  Anxiety 3.  GERD 4.  History of ETOH abuse 5.  DVT proph - on Lovenox 6.  To d/c foley  Subjective:  Doing okay.  Pain moderately well controlled with PCA.   Already out of bed. Objective:   Filed Vitals:   02/28/12 0740  BP:   Pulse:   Temp: 98.1 F (36.7 C)  Resp:      Intake/Output from previous day:  08/08 0701 - 08/09 0700 In: 3580 [P.O.:180; I.V.:3000; NG/GT:150; IV Piggyback:250] Out: 1600 [Urine:1075; Emesis/NG output:525]  Intake/Output this shift:      Physical Exam:   General: WN WM who is alert and oriented.    HEENT: Normal. Pupils equal. .   Lungs: Clear.  Moderated inspiratory effort.  To get IS at bedside.   Abdomen: Quiet.   Wound: with VAC   Neurologic:  Grossly intact to motor and sensory function.   Psychiatric: Has normal mood and affect.   Lab Results:    Basename 02/28/12 0500 02/27/12 0940  WBC 12.3* 12.7*  HGB 12.7* 13.9  HCT 38.2* 40.9  PLT 483* 439*    BMET   Basename 02/28/12 0500 02/27/12 0940  NA 135 137  K 4.9 4.2  CL 102 98  CO2 24 25  GLUCOSE 130* 151*  BUN 25* 23  CREATININE 0.69 0.65  CALCIUM 8.4 9.9    PT/INR   Basename 02/27/12 1223  LABPROT 14.1  INR 1.07    ABG  No results found for this basename: PHART:2,PCO2:2,PO2:2,HCO3:2 in the last 72 hours   Studies/Results:  Ct Abdomen Pelvis W Contrast  02/27/2012  *RADIOLOGY REPORT*  Clinical Data: Recent abdominal surgery partial sigmoid colectomy 8 days ago.  Abdominal pain, vomiting, free air on plain films.  CT ABDOMEN AND PELVIS WITH CONTRAST  Technique:  Multidetector CT  imaging of the abdomen and pelvis was performed following the standard protocol during bolus administration of intravenous contrast.  Contrast: OMNIPAQUE IOHEXOL 300 MG/ML  SOLN  Comparison: Plain films earlier today.  CT 11/04/2011  Findings: Dependent atelectasis in the lung bases.  No effusions. Heart is normal size.  Large amount of pneumoperitoneum in the abdomen and pelvis.  There appears to be anastomotic breakdown within the sigmoid colon.  Gas and stool is seen extending from the lumen of the sigmoid colon anteriorly into the mesentery.  Extensive stranding around the segment of sigmoid colon.  There also gas, stool and fluid collections within the mesentery.  Gas collection within the midline of the pelvis measures 3.0 x 2.1 cm on image 70.  Gas and fluid collection in the left lower abdomen on image 53 measures 5.6 x 3.6 cm.  There is marked inflammatory change, with wall thickening and stranding around multiple small bowel loops adjacent to this abscess, likely reactive.  Small bowel loops are also mildly dilated, possible ileus although some degree of obstruction within these inflamed loops cannot be excluded.  Extensive descending colonic and sigmoid diverticulosis.  Free fluid present in the pelvis and adjacent to the spleen.  Stomach is unremarkable.  Liver, spleen, gallbladder, pancreas, adrenals and kidneys are unremarkable.  No acute bony abnormality.  IMPRESSION: Findings suggest an anastomotic breakdown in the sigmoid colon. Gas and stool extends at the sigmoid colon into the mesentery.  At least two gas and fluid collections are seen in the mesentery, one the pelvis and one in the left lower abdomen.  Multiple small bowel loops in the left abdomen and pelvis are thick-walled and inflamed, likely secondarily inflamed.  Mildly prominent small bowel loops could reflect ileus although some degree of mechanical obstruction and no the severely inflamed left abdominal small bowel loop cannot be  excluded.  Large pneumoperitoneum.  Free fluid around the spleen and in the cul-de-sac of the pelvis.  These results were discussed with Dr. Ezzard Standing at the time of interpretation.  Original Report Authenticated By: Cyndie Chime, M.D.   Dg Abd Acute W/chest  02/27/2012  *RADIOLOGY REPORT*  Clinical Data: Vomiting, post partial colectomy 8 days ago.  ACUTE ABDOMEN SERIES (ABDOMEN 2 VIEW & CHEST 1 VIEW)  Comparison: CT 11/04/2011  Findings: Heart is normal size.  Lungs clear.  No effusions.  Large pneumoperitoneum noted.  This is more than expected for postop state greater than 1 week.  I cannot exclude bowel perforation.  No evidence of bowel obstruction.  No free air.  No organomegaly or acute bony abnormality.  IMPRESSION: A large pneumoperitoneum, greater than expected following surgery 8 days ago.  Cannot exclude bowel perforation.  These results were called to Dr. Effie Shy at the time of interpretation.  Original Report Authenticated By: Cyndie Chime, M.D.     Anti-infectives:   Anti-infectives     Start     Dose/Rate Route Frequency Ordered Stop   02/27/12 2100  piperacillin-tazobactam (ZOSYN) IVPB 3.375 g       3.375 g 12.5 mL/hr over 240 Minutes Intravenous 3 times per day 02/27/12 1226     02/27/12 1230  piperacillin-tazobactam (ZOSYN) IVPB 3.375 g       3.375 g 12.5 mL/hr over 240 Minutes Intravenous To Emergency Dept 02/27/12 1227 02/27/12 1730          Ovidio Kin, MD, FACS Pager: [phone],   [facility] Office: [phone] 02/28/2012

## 2012-02-28 NOTE — Progress Notes (Signed)
1 Day Post-Op  Subjective: No complaints today. I spoke with wife yesterday and she believes he tore anastamosis apart by doing lots of strenuous lifting at home.  Objective: Vital signs in last 24 hours: Temp:  [97.9 F (36.6 C)-98.9 F (37.2 C)] 98.1 F (36.7 C) (08/09 0740) Pulse Rate:  [108-146] 112  (08/08 1900) Resp:  [18-26] 21  (08/08 1900) BP: (138-173)/(89-106) 152/97 mmHg (08/08 1900) SpO2:  [92 %-99 %] 99 % (08/08 1900) Weight:  [183 lb (83.008 kg)] 183 lb (83.008 kg) (08/08 2000)    Intake/Output from previous day: 08/08 0701 - 08/09 0700 In: 3580 [P.O.:180; I.V.:3000; NG/GT:150; IV Piggyback:250] Out: 1600 [Urine:1075; Emesis/NG output:525] Intake/Output this shift:    GI: soft, appropriately tender. ostomy pink and small amount of production  Lab Results:   Basename 02/28/12 0500 02/27/12 0940  WBC 12.3* 12.7*  HGB 12.7* 13.9  HCT 38.2* 40.9  PLT 483* 439*   BMET  Basename 02/28/12 0500 02/27/12 0940  NA 135 137  K 4.9 4.2  CL 102 98  CO2 24 25  GLUCOSE 130* 151*  BUN 25* 23  CREATININE 0.69 0.65  CALCIUM 8.4 9.9   PT/INR  Basename 02/27/12 1223  LABPROT 14.1  INR 1.07   ABG No results found for this basename: PHART:2,PCO2:2,PO2:2,HCO3:2 in the last 72 hours  Studies/Results: Ct Abdomen Pelvis W Contrast  02/27/2012  *RADIOLOGY REPORT*  Clinical Data: Recent abdominal surgery partial sigmoid colectomy 8 days ago.  Abdominal pain, vomiting, free air on plain films.  CT ABDOMEN AND PELVIS WITH CONTRAST  Technique:  Multidetector CT imaging of the abdomen and pelvis was performed following the standard protocol during bolus administration of intravenous contrast.  Contrast: OMNIPAQUE IOHEXOL 300 MG/ML  SOLN  Comparison: Plain films earlier today.  CT 11/04/2011  Findings: Dependent atelectasis in the lung bases.  No effusions. Heart is normal size.  Large amount of pneumoperitoneum in the abdomen and pelvis.  There appears to be anastomotic  breakdown within the sigmoid colon.  Gas and stool is seen extending from the lumen of the sigmoid colon anteriorly into the mesentery.  Extensive stranding around the segment of sigmoid colon.  There also gas, stool and fluid collections within the mesentery.  Gas collection within the midline of the pelvis measures 3.0 x 2.1 cm on image 70.  Gas and fluid collection in the left lower abdomen on image 53 measures 5.6 x 3.6 cm.  There is marked inflammatory change, with wall thickening and stranding around multiple small bowel loops adjacent to this abscess, likely reactive.  Small bowel loops are also mildly dilated, possible ileus although some degree of obstruction within these inflamed loops cannot be excluded.  Extensive descending colonic and sigmoid diverticulosis.  Free fluid present in the pelvis and adjacent to the spleen.  Stomach is unremarkable.  Liver, spleen, gallbladder, pancreas, adrenals and kidneys are unremarkable.  No acute bony abnormality.  IMPRESSION: Findings suggest an anastomotic breakdown in the sigmoid colon. Gas and stool extends at the sigmoid colon into the mesentery.  At least two gas and fluid collections are seen in the mesentery, one the pelvis and one in the left lower abdomen.  Multiple small bowel loops in the left abdomen and pelvis are thick-walled and inflamed, likely secondarily inflamed.  Mildly prominent small bowel loops could reflect ileus although some degree of mechanical obstruction and no the severely inflamed left abdominal small bowel loop cannot be excluded.  Large pneumoperitoneum.  Free fluid around  the spleen and in the cul-de-sac of the pelvis.  These results were discussed with Dr. Ezzard Standing at the time of interpretation.  Original Report Authenticated By: Cyndie Chime, M.D.   Dg Abd Acute W/chest  02/27/2012  *RADIOLOGY REPORT*  Clinical Data: Vomiting, post partial colectomy 8 days ago.  ACUTE ABDOMEN SERIES (ABDOMEN 2 VIEW & CHEST 1 VIEW)  Comparison: CT  11/04/2011  Findings: Heart is normal size.  Lungs clear.  No effusions.  Large pneumoperitoneum noted.  This is more than expected for postop state greater than 1 week.  I cannot exclude bowel perforation.  No evidence of bowel obstruction.  No free air.  No organomegaly or acute bony abnormality.  IMPRESSION: A large pneumoperitoneum, greater than expected following surgery 8 days ago.  Cannot exclude bowel perforation.  These results were called to Dr. Effie Shy at the time of interpretation.  Original Report Authenticated By: Cyndie Chime, M.D.    Anti-infectives: Anti-infectives     Start     Dose/Rate Route Frequency Ordered Stop   02/27/12 2100   piperacillin-tazobactam (ZOSYN) IVPB 3.375 g        3.375 g 12.5 mL/hr over 240 Minutes Intravenous 3 times per day 02/27/12 1226     02/27/12 1230   piperacillin-tazobactam (ZOSYN) IVPB 3.375 g        3.375 g 12.5 mL/hr over 240 Minutes Intravenous To Emergency Dept 02/27/12 1227 02/27/12 1730          Assessment/Plan: s/p Procedure(s) (LRB): EXPLORATORY LAPAROTOMY (N/A) COLOSTOMY (N/A) COLON RESECTION (N/A) Continue abx Continue foley to monitor uop Npo and ng tube Continue to monitor in icu Ns fluid bolus today  LOS: 1 day    TOTH III,Flor Whitacre S 02/28/2012

## 2012-02-29 LAB — CBC WITH DIFFERENTIAL/PLATELET
Basophils Absolute: 0 10*3/uL (ref 0.0–0.1)
Eosinophils Absolute: 0 10*3/uL (ref 0.0–0.7)
Eosinophils Relative: 0 % (ref 0–5)
Lymphs Abs: 1.2 10*3/uL (ref 0.7–4.0)
MCH: 28.6 pg (ref 26.0–34.0)
MCHC: 32.7 g/dL (ref 30.0–36.0)
MCV: 87.4 fL (ref 78.0–100.0)
Platelets: 480 10*3/uL — ABNORMAL HIGH (ref 150–400)
RDW: 16.9 % — ABNORMAL HIGH (ref 11.5–15.5)

## 2012-02-29 LAB — BASIC METABOLIC PANEL
BUN: 16 mg/dL (ref 6–23)
CO2: 26 mEq/L (ref 19–32)
Calcium: 8.3 mg/dL — ABNORMAL LOW (ref 8.4–10.5)
Creatinine, Ser: 0.5 mg/dL (ref 0.50–1.35)
Glucose, Bld: 92 mg/dL (ref 70–99)

## 2012-02-29 LAB — BODY FLUID CULTURE

## 2012-02-29 MED ORDER — PANTOPRAZOLE SODIUM 40 MG IV SOLR
40.0000 mg | INTRAVENOUS | Status: DC
Start: 2012-02-29 — End: 2012-03-08
  Administered 2012-02-29 – 2012-03-07 (×8): 40 mg via INTRAVENOUS
  Filled 2012-02-29 (×9): qty 40

## 2012-02-29 MED ORDER — KCL IN DEXTROSE-NACL 20-5-0.45 MEQ/L-%-% IV SOLN
INTRAVENOUS | Status: DC
  Administered 2012-02-29 (×2): via INTRAVENOUS
  Administered 2012-03-01: 125 mL via INTRAVENOUS
  Administered 2012-03-02 – 2012-03-07 (×12): via INTRAVENOUS
  Filled 2012-02-29 (×25): qty 1000

## 2012-02-29 NOTE — Progress Notes (Signed)
2 Days Post-Op  Subjective: Feels ok, voiding well, pain controlled  Objective: Vital signs in last 24 hours: Temp:  [98.1 F (36.7 C)-99.5 F (37.5 C)] 98.6 F (37 C) (08/10 0722) Pulse Rate:  [110-123] 112  (08/10 0700) Resp:  [15-19] 17  (08/10 0700) BP: (132-164)/(74-100) 147/81 mmHg (08/10 0700) SpO2:  [95 %-98 %] 97 % (08/10 0700) Weight:  [178 lb 12.7 oz (81.1 kg)] 178 lb 12.7 oz (81.1 kg) (08/10 0400)    Intake/Output from previous day: 08/09 0701 - 08/10 0700 In: 2981.5 [I.V.:2194; NG/GT:150; IV Piggyback:637.5] Out: 2150 [Urine:1850; Emesis/NG output:300] Intake/Output this shift:    General appearance: no distress Resp: clear to auscultation bilaterally Cardio: tachycardic rr GI: stoma pink no real output yet, appropriately tender, mild distended, no bs, vac removed and wound a little soupy  Lab Results:   Basename 02/28/12 0500 02/27/12 0940  WBC 12.3* 12.7*  HGB 12.7* 13.9  HCT 38.2* 40.9  PLT 483* 439*   BMET  Basename 02/28/12 0500 02/27/12 0940  NA 135 137  K 4.9 4.2  CL 102 98  CO2 24 25  GLUCOSE 130* 151*  BUN 25* 23  CREATININE 0.69 0.65  CALCIUM 8.4 9.9   PT/INR  Basename 02/27/12 1223  LABPROT 14.1  INR 1.07    Anti-infectives: Anti-infectives     Start     Dose/Rate Route Frequency Ordered Stop   02/27/12 2100   piperacillin-tazobactam (ZOSYN) IVPB 3.375 g        3.375 g 12.5 mL/hr over 240 Minutes Intravenous 3 times per day 02/27/12 1226     02/27/12 1230   piperacillin-tazobactam (ZOSYN) IVPB 3.375 g        3.375 g 12.5 mL/hr over 240 Minutes Intravenous To Emergency Dept 02/27/12 1227 02/27/12 1730          Assessment/Plan: POD 2 sigmoid resection, colostomy after anastomotic leak 1. Continue pca 2. pulm toilet, oob to chair 3. Tachycardia I think appropriate for what is going on 4. Npo, ileus awaiting it to resolve, continue ng tube 5. Check labs today pending now 6. Will stop vac for now, start bid wet to dry  dressings 7. Can go to stepdown    LOS: 2 days    [facility] 02/29/2012

## 2012-03-01 LAB — BASIC METABOLIC PANEL
Calcium: 8.2 mg/dL — ABNORMAL LOW (ref 8.4–10.5)
GFR calc Af Amer: >90 mL/min (ref >90)
GFR calc non Af Amer: >90 mL/min (ref >90)
Glucose, Bld: 109 mg/dL — ABNORMAL HIGH (ref 70–99)
Sodium: 135 mEq/L (ref 135–145)

## 2012-03-01 LAB — CBC
MCH: 28.3 pg (ref 26.0–34.0)
MCHC: 32.7 g/dL (ref 30.0–36.0)
Platelets: 550 10*3/uL — ABNORMAL HIGH (ref 150–400)
RDW: 16.4 % — ABNORMAL HIGH (ref 11.5–15.5)

## 2012-03-01 MED ORDER — SODIUM CHLORIDE 0.9 % IJ SOLN: 9.0000 mL | INTRAMUSCULAR | Status: DC | PRN

## 2012-03-01 MED ORDER — HYDROMORPHONE HCL PF 1 MG/ML IJ SOLN
1.0000 mg | Freq: Once | INTRAMUSCULAR | Status: AC
  Administered 2012-03-01: 1 mg via INTRAVENOUS

## 2012-03-01 MED ORDER — DEXTROSE 5 % IV SOLN
1.0000 g | INTRAVENOUS | Status: DC
  Administered 2012-03-01 – 2012-03-09 (×9): 1 g via INTRAVENOUS
  Filled 2012-03-01 (×10): qty 10

## 2012-03-01 MED ORDER — HYDROMORPHONE 0.3 MG/ML IV SOLN
INTRAVENOUS | Status: DC
  Administered 2012-03-01: 4.2 mg via INTRAVENOUS
  Administered 2012-03-01 (×2): via INTRAVENOUS
  Administered 2012-03-02: 2.84 mg via INTRAVENOUS
  Administered 2012-03-02: 15:00:00 via INTRAVENOUS
  Administered 2012-03-02: 0.9 mg via INTRAVENOUS
  Administered 2012-03-02: 4.5 mg via INTRAVENOUS
  Administered 2012-03-02: 4.2 mg via INTRAVENOUS
  Administered 2012-03-02: 3.6 mg via INTRAVENOUS
  Administered 2012-03-02: 06:00:00 via INTRAVENOUS
  Administered 2012-03-02: 3.3 mg via INTRAVENOUS
  Administered 2012-03-03 (×2): 2.1 mg via INTRAVENOUS
  Administered 2012-03-03: 10:00:00 via INTRAVENOUS
  Administered 2012-03-03: 4.96 mg via INTRAVENOUS
  Administered 2012-03-03: 3 mg via INTRAVENOUS
  Administered 2012-03-03: 1.8 mg via INTRAVENOUS
  Administered 2012-03-04: 5.1 mg via INTRAVENOUS
  Administered 2012-03-04: 2.1 mg via INTRAVENOUS
  Administered 2012-03-04: 4.04 mg via INTRAVENOUS
  Administered 2012-03-04: 4.5 mg via INTRAVENOUS
  Administered 2012-03-04: 1.8 mg via INTRAVENOUS
  Administered 2012-03-05: 7.2 mg via INTRAVENOUS
  Administered 2012-03-05: 2.97 mg via INTRAVENOUS
  Administered 2012-03-05 (×3): via INTRAVENOUS
  Administered 2012-03-06: 1.5 mg via INTRAVENOUS
  Administered 2012-03-06: 1.99 mg via INTRAVENOUS
  Administered 2012-03-06: 2.7 mg via INTRAVENOUS
  Administered 2012-03-06: 10:00:00 via INTRAVENOUS
  Administered 2012-03-06: 4.5 mg via INTRAVENOUS
  Administered 2012-03-06: 2.1 mg via INTRAVENOUS
  Filled 2012-03-01 (×13): qty 25

## 2012-03-01 MED ORDER — DIPHENHYDRAMINE HCL 12.5 MG/5ML PO ELIX
12.5000 mg | ORAL_SOLUTION | Freq: Four times a day (QID) | ORAL | Status: DC | PRN
  Filled 2012-03-01: qty 5

## 2012-03-01 MED ORDER — ONDANSETRON HCL 4 MG/2ML IJ SOLN: 4.0000 mg | Freq: Four times a day (QID) | INTRAMUSCULAR | Status: DC | PRN

## 2012-03-01 MED ORDER — DIPHENHYDRAMINE HCL 50 MG/ML IJ SOLN: 12.5000 mg | Freq: Four times a day (QID) | INTRAMUSCULAR | Status: DC | PRN

## 2012-03-01 MED ORDER — NALOXONE HCL 0.4 MG/ML IJ SOLN: 0.4000 mg | INTRAMUSCULAR | Status: DC | PRN

## 2012-03-01 NOTE — Progress Notes (Signed)
3 Days Post-Op  Subjective: Patient very uncomfortable.  Pain improves for about an hour after receiving pain meds, but it is not lasting two hours.  Will start PCA  Objective: Vital signs in last 24 hours: Temp:  [97.6 F (36.4 C)-99.5 F (37.5 C)] 97.9 F (36.6 C) (08/11 0800) Pulse Rate:  [107-129] 111  (08/11 0800) Resp:  [16-25] 23  (08/11 0800) BP: (123-159)/(70-90) 159/90 mmHg (08/11 0744) SpO2:  [91 %-95 %] 94 % (08/11 0800)    Intake/Output from previous day: 08/10 0701 - 08/11 0700 In: 1560 [I.V.:1232.5; NG/GT:230; IV Piggyback:97.5] Out: 3070 [Urine:2350; Emesis/NG output:700; Stool:20] Intake/Output this shift: Total I/O In: 0  Out: 350 [Urine:350]  General appearance: alert, cooperative and diaphoretic GI: distended; quiet LLQ ostomy - some liquid beginning to collect in ostomy bag Wound - minimal drainage; clean  Lab Results:   [facility] 03/01/12 0532 02/29/12 0720  WBC 18.3* 16.3*  HGB 10.5* 10.4*  HCT 32.1* 31.8*  PLT 550* 480*   BMET  Basename 03/01/12 0532 02/29/12 0720  NA 135 138  K 3.4* 4.0  CL 100 102  CO2 26 26  GLUCOSE 109* 92  BUN 10 16  CREATININE 0.46* 0.50  CALCIUM 8.2* 8.3*   PT/INR  Basename 02/27/12 1223  LABPROT 14.1  INR 1.07   ABG No results found for this basename: PHART:2,PCO2:2,PO2:2,HCO3:2 in the last 72 hours  Studies/Results: No results found.  Anti-infectives: Anti-infectives     Start     Dose/Rate Route Frequency Ordered Stop   02/27/12 2100   piperacillin-tazobactam (ZOSYN) IVPB 3.375 g        3.375 g 12.5 mL/hr over 240 Minutes Intravenous 3 times per day 02/27/12 1226     02/27/12 1230   piperacillin-tazobactam (ZOSYN) IVPB 3.375 g        3.375 g 12.5 mL/hr over 240 Minutes Intravenous To Emergency Dept 02/27/12 1227 02/27/12 1730          Assessment/Plan: s/p Procedure(s) (LRB): EXPLORATORY LAPAROTOMY (N/A) COLOSTOMY (N/A) COLON RESECTION (N/A) Continue antibiotics - increasing WBC - high  risk for intra-abdominal abscess POI - continue NPO, NGT Start Dilaudid PCA  LOS: 3 days    Burns Timson K. 03/01/2012

## 2012-03-01 NOTE — Progress Notes (Signed)
MD/N concerning pt c/o of pain unrelieved with current order for pain management, MD stated will assess upon rounding, RN will cont to monitior pt and use therapeutic pain management techniques per Nursing

## 2012-03-01 NOTE — Clinical Social Work Psychosocial (Signed)
Clinical Social Work Department BRIEF PSYCHOSOCIAL ASSESSMENT 03/01/2012  Patient:  Timothy [name], Timothy [name]     Account Number:  [id number]     Admit date:  02/27/2012  Clinical Social Worker:  Andres Shad  Date/Time:  03/01/2012 01:47 PM  Referred by:  Physician  Date Referred:  03/01/2012 Referred for  Other - See comment   Other Referral:   Patient wife reports they do not have medicaid and consult is for medicaid/resources   Interview type:  Patient Other interview type:   wife also present in room and particpated throughout discussion    PSYCHOSOCIAL DATA Living Status:  WIFE Admitted from facility:   Level of care:   Primary support name:  Wife:Timothy [name] Primary support relationship to patient:  FAMILY Degree of support available:   Limited.  Wife reports she works a part time job and takes care of the two children.    CURRENT CONCERNS Current Concerns  Financial Resources   Other Concerns:   Home health needs.    SOCIAL WORK ASSESSMENT / PLAN Met with patient and wife at the bedside. Discussed reason for consult and wife was able to give more information as to why medicaid and resources are needed.    Wife reports when patient was admitted last time, she worked with Artist, Chief Financial Officer. Reports she was able to get retroactive medicaid on previous admission and thought to have medicaid this admission. Reports she has a case worker at DSS named Ms. Prichard. Reports medicaid was pending for a long time and then they were issued a card, (CSW did see the card, but card did not have a number on it).  Wife reports she went to MD appointment, presented card and found out she was not insured by medicaid. Wife reports she has called several times with regards to medicaid, had an orange card, but now does not have coverage for herself, patient or children.  Discussed with wife if she has been down to meet with case worker or supervisor to discuss medicaid barriers, in which  she replies "no, I have a sick husband and I want to be here with him".  Educated wife that she may need to go down and present paperwork and information to receive medicaid. Also discussed we will call financial counselors with regards to medicaid status upon this admission and will follow up on Monday when they are in the office.  Wife was agreeable and reports a lot of frustration.  Other concern wife voices is that they will need home health. Discussed with wife that MD will make that decision and will write the order. Wife agreeable.  Will have unit CSW to follow up on Monday with financial counselors and work with family on medicaid issues and make referral.  Attempted to motivate wife to go to DSS early next week for medicaid, but unsure if she will comply.   Assessment/plan status:  Psychosocial Support/Ongoing Assessment of Needs Other assessment/ plan:   Financial Counselors referral   Information/referral to community resources:   Will reassess at DC.  Medicaid resources    PATIENT'S/FAMILY'S RESPONSE TO PLAN OF CARE: Wife and husband are frustrated with the system, per there report. Wife voices understanding about medicaid and plan for referral to financial counselors in the AM of 8/12.    Ashley Jacobs, MSW LCSW [phone]

## 2012-03-02 ENCOUNTER — Inpatient Hospital Stay (HOSPITAL_COMMUNITY): Payer: Medicaid Other

## 2012-03-02 MED ORDER — IOHEXOL 300 MG/ML  SOLN
100.0000 mL | Freq: Once | INTRAMUSCULAR | Status: AC | PRN
  Administered 2012-03-02: 100 mL via INTRAVENOUS

## 2012-03-02 MED ORDER — SODIUM CHLORIDE 0.9 % IJ SOLN
INTRAMUSCULAR | Status: AC
  Administered 2012-03-02: 10 mL
  Filled 2012-03-02: qty 10

## 2012-03-02 NOTE — Consult Note (Signed)
WOC ostomy consult  Stoma type/location: LLQ, end colostomy Stomal assessment/size: 1 3/8" round, budded.  Pink and moist Peristomal assessment: intact without problems Treatment options for stomal/peristomal skin: none needed Output liquid brown in pouch Ostomy pouching: 2pc. 2 3/4" used today.  Cut to fit at slightly larger than 1 3/8".  Education provided:  Demonstrated pouch application to pt and his wife at bedside. They are very eager to learn.  I have provided them with colostomy educational booklet.  Reviewed basic ostomy care and creation of stoma.  Answered questions from them both and provided my contact information.  Will allow wife and pt to do next pouch change Wednesday or Thursday this week.   At the current time pt does not have insurance.  I have explained  [facility] indigent program and I will contact them today to get this process started for them.  I will as well enroll the pt in the Secure Start discharge program for samples to be sent to the pts home.  WOC will follow along with you for ostomy management.  Yanitza Shvartsman [place], [place] [phone]

## 2012-03-02 NOTE — Progress Notes (Signed)
4 Days Post-Op  Subjective: No complaints other than some soreness  Objective: Vital signs in last 24 hours: Temp:  [98.3 F (36.8 C)-98.8 F (37.1 C)] 98.5 F (36.9 C) (08/12 0800) Pulse Rate:  [108-116] 113  (08/12 0800) Resp:  [13-23] 20  (08/12 0800) BP: (137-158)/(77-96) 137/92 mmHg (08/12 0800) SpO2:  [91 %-95 %] 94 % (08/12 0800)    Intake/Output from previous day: 08/11 0701 - 08/12 0700 In: 4925 [I.V.:4625; NG/GT:300] Out: 2475 [Urine:2125; Emesis/NG output:350] Intake/Output this shift: Total I/O In: 125 [I.V.:125] Out: 600 [Urine:350; Emesis/NG output:250]  GI: soft, distended. less tender. midline wound open and relatively clean. ostomy pink with no output  Lab Results:   Basename 03/01/12 0532 02/29/12 0720  WBC 18.3* 16.3*  HGB 10.5* 10.4*  HCT 32.1* 31.8*  PLT 550* 480*   BMET  Basename 03/01/12 0532 02/29/12 0720  NA 135 138  K 3.4* 4.0  CL 100 102  CO2 26 26  GLUCOSE 109* 92  BUN 10 16  CREATININE 0.46* 0.50  CALCIUM 8.2* 8.3*   PT/INR No results found for this basename: LABPROT:2,INR:2 in the last 72 hours ABG No results found for this basename: PHART:2,PCO2:2,PO2:2,HCO3:2 in the last 72 hours  Studies/Results: No results found.  Anti-infectives: Anti-infectives     Start     Dose/Rate Route Frequency Ordered Stop   03/01/12 1200   cefTRIAXone (ROCEPHIN) 1 g in dextrose 5 % 50 mL IVPB        1 g 100 mL/hr over 30 Minutes Intravenous Every 24 hours 03/01/12 1138     02/27/12 2100   piperacillin-tazobactam (ZOSYN) IVPB 3.375 g  Status:  Discontinued        3.375 g 12.5 mL/hr over 240 Minutes Intravenous 3 times per day 02/27/12 1226 03/01/12 1138   02/27/12 1230   piperacillin-tazobactam (ZOSYN) IVPB 3.375 g        3.375 g 12.5 mL/hr over 240 Minutes Intravenous To Emergency Dept 02/27/12 1227 02/27/12 1730          Assessment/Plan: s/p Procedure(s) (LRB): EXPLORATORY LAPAROTOMY (N/A) COLOSTOMY (N/A) COLON RESECTION  (N/A) Continue NG and bowel rest Plan for CT abd and pelvis to look for abscess since WBC keeps rising  LOS: 4 days    TOTH III,Lashai Grosch S 03/02/2012

## 2012-03-02 NOTE — Progress Notes (Signed)
Clinical Social Work  CSW met with patient and wife at bedside. Patient reported that wife takes care of needs and asked CSW to direct conversation with her. Wife reported frustration with Medicaid and food stamps. CSW reminded wife that CSW has no direct affliation with Department of Social Services and is unable to change their decisions. Wife reports that she has been in contact with DSS who reports that wife needs to appear in person. Wife asked CSW to contact DSS. DSS reported same information to CSW. Wife reports that she will consider going to DSS after patient is released from the hospital. CSW encouraged wife to follow up DSS as soon as possible. CSW called financial counselor (Maddie) and left a message since wife reported that she had previously worked with her. CSW is signing off but available if needed.  [place], [place] [phone]

## 2012-03-02 NOTE — Progress Notes (Signed)
50cc oral contrast given via NGT, and then NGT clamped per MD order.  Radiology notified to get pt in an hour for CT abdomen.    Roselie Awkward, RN

## 2012-03-03 ENCOUNTER — Inpatient Hospital Stay (HOSPITAL_COMMUNITY): Payer: Medicaid Other

## 2012-03-03 ENCOUNTER — Telehealth: Payer: Self-pay | Admitting: Family Medicine

## 2012-03-03 ENCOUNTER — Encounter (HOSPITAL_COMMUNITY): Payer: Self-pay | Admitting: Radiology

## 2012-03-03 MED ORDER — FENTANYL CITRATE 0.05 MG/ML IJ SOLN
INTRAMUSCULAR | Status: AC | PRN
  Administered 2012-03-03: 100 ug via INTRAVENOUS
  Administered 2012-03-03: 50 ug via INTRAVENOUS

## 2012-03-03 MED ORDER — PHENOL 1.4 % MT LIQD
1.0000 | OROMUCOSAL | Status: DC | PRN
  Administered 2012-03-03: 1 via OROMUCOSAL
  Filled 2012-03-03: qty 177

## 2012-03-03 MED ORDER — SODIUM CHLORIDE 0.9 % IJ SOLN
INTRAMUSCULAR | Status: AC
  Filled 2012-03-03: qty 10

## 2012-03-03 MED ORDER — MIDAZOLAM HCL 5 MG/5ML IJ SOLN
INTRAMUSCULAR | Status: AC | PRN
  Administered 2012-03-03: 2 mg via INTRAVENOUS
  Administered 2012-03-03: 1 mg via INTRAVENOUS

## 2012-03-03 MED ORDER — SODIUM CHLORIDE 0.9 % IJ SOLN
INTRAMUSCULAR | Status: AC
  Administered 2012-03-03: 10 mL
  Filled 2012-03-03: qty 10

## 2012-03-03 MED ORDER — MIDAZOLAM HCL 2 MG/2ML IJ SOLN
INTRAMUSCULAR | Status: AC
  Filled 2012-03-03: qty 4

## 2012-03-03 MED ORDER — FENTANYL CITRATE 0.05 MG/ML IJ SOLN
INTRAMUSCULAR | Status: AC
  Filled 2012-03-03: qty 4

## 2012-03-03 NOTE — Progress Notes (Signed)
Clinical Social Work  CSW met with patient and wife at bedside. Wife reported that she had not been to Department of Social Services but felt she would be able to go there within the next few days. CSW provided patient with CSW contact information if needed. CSW is signing off.  [place], [place] [phone]

## 2012-03-03 NOTE — Progress Notes (Signed)
Progress Note Si Raider. [name], M.D., M.B.A  Family Medicine PGY-2 Pager [phone]  Patient name: Timothy [name] Medical record number: [id number] Date of birth: [birth year] Age: 46 y.o. Gender: male   I came by to check on the patient more as a social call since Dr. Carolynne Edouard and the surgery team is the primary team. He seems to have tolerated the abscess drainage well and is maintaining good spirits about the whole situation. [name] and his wife, [name], are both very grateful for the care that he is receiving from Dr. Carolynne Edouard, the nurses, and the rest of the care team. As his PCP, I am very appreciative of this as well. He did express concerns about anxiety management to me last week, prior to the re-admission. I told him that we would discuss this further once the acute concerns about his intestines have all be addressed and his condition is more stable. He was agreeable to this.   Please do no hesitate to contact me or a member of the family medicine team if we can be of assistance in carrying for [name].    Roslynn Amble, MD

## 2012-03-03 NOTE — Telephone Encounter (Signed)
Wife is calling because Natalio had Emergency Surgery for Intestine Surgery, but he blew it out.  They have put in tube to drain the infection.  He would like to see Dr. [name] and he is in room [phone]

## 2012-03-03 NOTE — Procedures (Signed)
LLQ abscess drain Serosanguinous fluid No comp

## 2012-03-03 NOTE — Telephone Encounter (Signed)
Thank you for letting me know. Please call the patient's wife and tell her that I will be over after 5:30 PM tonight.

## 2012-03-03 NOTE — H&P (Signed)
Chief Complaint: Abd pain Referring Physician:Carolynne Edouard HPI: Timothy [name] is an 46 y.o. male with recent abdominal/colonic surgery. Now has developed intra-abdominal abscess. Request for IR to perc drain is made.  Past Medical History:  Past Medical History  Diagnosis Date  . Hypoglycemia   . GERD (gastroesophageal reflux disease)   . Diverticulosis     By colonoscopy June 2005  . Lower GI bleed     June 2005. Presumably secondary to diverticulosis.  . Active smoker   . Anemia   . Anxiety   . Alcohol abuse   . Hemorrhoids   . Family history of anesthesia complication     Mother N/V  . Bronchitis     history  . Headache     with hypoglycemia  . Cellulitis     Hx    . Glaucoma syndrome     does not use eye drops, "They burn".  . Sleep apnea     witness apnea by wife.  . Elevated CK 3/13  . H/O Prinzmetal angina     has not experienced in > 1 month.    Past Surgical History:  Past Surgical History  Procedure Date  . Appendectomy 1982  . Elbow surgery     left, pins inserted  age of 7  . Wisdom tooth extraction   . Colon surgery 02/19/2012    SIGMOID  . Wart fulguration 02/19/2012    Procedure: FULGURATION ANAL WART;  Surgeon: Robyne Askew, MD;  Location: [facility] OR;  Service: General;  Laterality: N/A;  Destroy  Anal Condyloma   . Laparotomy 02/27/2012    Procedure: EXPLORATORY LAPAROTOMY;  Surgeon: Robyne Askew, MD;  Location: [facility] OR;  Service: General;  Laterality: N/A;  . Colostomy 02/27/2012    Procedure: COLOSTOMY;  Surgeon: Robyne Askew, MD;  Location: [facility] OR;  Service: General;  Laterality: N/A;  . Colon resection 02/27/2012    Procedure: COLON RESECTION;  Surgeon: Robyne Askew, MD;  Location: [facility] OR;  Service: General;  Laterality: N/A;    Family History:  Family History  Problem Relation Age of Onset  . Diabetes Mother   . Parkinsonism Mother   . Depression Mother   . Alcohol abuse Father   . Hypertension Father   . Breast cancer Maternal Aunt   . Stroke  Neg Hx   . Heart disease Neg Hx   . Anxiety disorder Father   . Ulcers Father   . Colon polyps Father   . Diabetes Mother   . Diabetes Maternal Grandmother     Social History:  reports that he has been smoking Cigarettes.  He has a 67.5 pack-year smoking history. His smokeless tobacco use includes Chew. He reports that he drinks alcohol. He reports that he does not use illicit drugs.  Allergies:  Allergies  Allergen Reactions  . Darvocet (Propoxyphene-Acetaminophen) Hives  . Morphine And Related Itching    Medications: Medications Prior to Admission  Medication Sig Dispense Refill  . bisacodyl (DULCOLAX) 5 MG EC tablet Take 5 mg by mouth 2 (two) times daily.      . clonazePAM (KLONOPIN) 0.5 MG tablet Take 0.5 mg by mouth 2 (two) times daily as needed. For nerves      . hydrocortisone (ANUSOL-HC) 2.5 % rectal cream Place 1 application rectally 2 (two) times daily.      Marland Kitchen ibuprofen (ADVIL,MOTRIN) 800 MG tablet Take 800 mg by mouth every 8 (eight) hours as needed. For pain      .  oxyCODONE-acetaminophen (PERCOCET/ROXICET) 5-325 MG per tablet Take 1-2 tablets by mouth every 4 (four) hours as needed.  40 tablet  0    Please HPI for pertinent positives, otherwise complete 10 system ROS negative.  Physical Exam: Blood pressure 137/80, pulse 117, temperature 97.7 F (36.5 C), temperature source Oral, resp. rate 20, height 5\' 10"  (1.778 m), weight 178 lb 12.7 oz (81.1 kg), SpO2 93.00%. Body mass index is 25.65 kg/(m^2).   General Appearance:  Alert, cooperative, no distress, appears stated age  Head:  Normocephalic, without obvious abnormality, atraumatic  ENT: Unremarkable, NG in (L)nare  Neck: Supple, symmetrical, trachea midline, no adenopathy, thyroid: not enlarged, symmetric, no tenderness/mass/nodules  Lungs:   Clear to auscultation bilaterally, no w/r/r, respirations unlabored without use of accessory muscles.  Chest Wall:  No tenderness or deformity  Heart:  Regular rate and  rhythm, S1, S2 normal, no murmur, rub or gallop. Carotids 2+ without bruit.  Abdomen:   Soft, (L)abd colostomy. Midline open wound.  Extremities: Extremities normal, atraumatic, no cyanosis or edema  Neurologic: Normal affect, no gross deficits.   No results found for this or any previous visit (from the past 48 hour(s)). Ct Abdomen Pelvis W Contrast  03/02/2012  *RADIOLOGY REPORT*  Clinical Data: Recent surgeries for diverticular rupture.  Evaluate for abscess.  CT ABDOMEN AND PELVIS WITH CONTRAST  Technique:  Multidetector CT imaging of the abdomen and pelvis was performed following the standard protocol during bolus administration of intravenous contrast.  Contrast: OMNIPAQUE IOHEXOL 300 MG/ML  SOLN  Comparison: 02/27/2012.  Findings: Lung bases show a small left pleural effusion with bibasilar subpleural atelectasis, left greater than right.  Heart size normal.  No pericardial effusion.  A sub centimeter short axis lymph node is seen adjacent to the distal esophagus.  Liver is unremarkable.  Probable residual vicarious excretion of contrast in the gallbladder.  Adrenal glands and kidneys are unremarkable.  Spleen, pancreas, stomach and small bowel are otherwise unremarkable.  Postoperative changes are seen in the sigmoid colon.  There may be mild wall thickening involving the descending colon.  Left upper quadrant colostomy.  There are several pockets of interloop/peritoneal fluid with hyperattenuating borders.  The largest is seen in the anterior aspect of the left anatomic pelvis, measuring 3.2 x 6.0 cm (image 79).  Multiple areas of associated extraluminal peritoneal air, as seen on 02/27/2012, have resolved in the interval. Open ventral abdominal wound. Small amount of air in the bladder is presumably iatrogenic.  Atherosclerotic calcification of the arterial vasculature without abdominal aortic aneurysm.  No definite pathologically enlarged lymph nodes.  No worrisome lytic or sclerotic lesions.   Sclerotic bilateral L5 pars defects without alignment abnormality.  IMPRESSION:  1.  Interval sigmoid colon resection with left upper quadrant colostomy and Hartmann's pouch formation.  Several pockets of peritoneal/interloop fluid with high attenuation borders, most suggestive of abscesses. 2.  Small left pleural effusion.  Original Report Authenticated By: Reyes Ivan, M.D.    Assessment/Plan Intra-abdominal abscesses S/p recent open colon surgery Reviewed imaging with IR MD, amenable to perc drain as several pockets appear communicate, will place drain to dominant (L)collection. Procedure discussed with pt including risks and complications. Will hold Lovenox this am,. Consent signed in chart.  Brayton El PA-C 03/03/2012, 9:54 AM

## 2012-03-03 NOTE — Progress Notes (Signed)
Clinical Social Work  CSW attempted to follow up with patient but patient was out of room for procedure. CSW will follow up at a later time.  [place], [place] [phone]

## 2012-03-04 LAB — CBC WITH DIFFERENTIAL/PLATELET
Basophils Absolute: 0 10*3/uL (ref 0.0–0.1)
Eosinophils Absolute: 0.2 10*3/uL (ref 0.0–0.7)
HCT: 34.6 % — ABNORMAL LOW (ref 39.0–52.0)
Lymphs Abs: 2 10*3/uL (ref 0.7–4.0)
MCHC: 32.7 g/dL (ref 30.0–36.0)
MCV: 87.2 fL (ref 78.0–100.0)
Monocytes Absolute: 1.8 10*3/uL — ABNORMAL HIGH (ref 0.1–1.0)
Neutro Abs: 12.8 10*3/uL — ABNORMAL HIGH (ref 1.7–7.7)
Platelets: 844 10*3/uL — ABNORMAL HIGH (ref 150–400)
RDW: 15.6 % — ABNORMAL HIGH (ref 11.5–15.5)

## 2012-03-04 LAB — BASIC METABOLIC PANEL
BUN: 7 mg/dL (ref 6–23)
Calcium: 8.5 mg/dL (ref 8.4–10.5)
Creatinine, Ser: 0.43 mg/dL — ABNORMAL LOW (ref 0.50–1.35)
GFR calc Af Amer: >90 mL/min (ref >90)
GFR calc non Af Amer: >90 mL/min (ref >90)

## 2012-03-04 MED ORDER — SODIUM CHLORIDE 0.9 % IJ SOLN
INTRAMUSCULAR | Status: AC
  Filled 2012-03-04: qty 10

## 2012-03-04 NOTE — Progress Notes (Signed)
[  patient being transferred to [facility] per md order. Report called to Tresa Endo, RN, will travel in bed.

## 2012-03-04 NOTE — Progress Notes (Signed)
6 Days Post-Op  Subjective: Diverticular abscess drain placed 8/13 Pt feels well No complaints  Objective: Vital signs in last 24 hours: Temp:  [98 F (36.7 C)-98.5 F (36.9 C)] 98.5 F (36.9 C) (08/14 0800) Pulse Rate:  [96-116] 99  (08/14 0415) Resp:  [14-22] 15  (08/14 0415) BP: (134-158)/(74-104) 139/74 mmHg (08/14 0415) SpO2:  [92 %-100 %] 94 % (08/14 0415)    Intake/Output from previous day: 08/13 0701 - 08/14 0700 In: 1605 [P.O.:150; I.V.:1375; NG/GT:30; IV Piggyback:50] Out: 2700 [Urine:1950; Emesis/NG output:750] Intake/Output this shift: Total I/O In: 5 [Other:5] Out: 90 [Emesis/NG output:40; Drains:50]  PE:  Afeb; VSS Output 50 cc today - serous Site clean and dry; NT Cxs pending Wbc: 16.8 (18.3)  Lab Results:   Basename 03/04/12 0911  WBC 16.8*  HGB 11.3*  HCT 34.6*  PLT 844*   BMET No results found for this basename: NA:2,K:2,CL:2,CO2:2,GLUCOSE:2,BUN:2,CREATININE:2,CALCIUM:2 in the last 72 hours PT/INR No results found for this basename: LABPROT:2,INR:2 in the last 72 hours ABG No results found for this basename: PHART:2,PCO2:2,PO2:2,HCO3:2 in the last 72 hours  Studies/Results: Ct Guided Abscess Drain  03/03/2012  *RADIOLOGY REPORT*  Clinical Data/Indication: LEFT LOWER QUADRANT ABSCESS  CT GUIDED ABCESS DRAINAGE WITH CATHETER  Sedation: Versed three mg, Fentanyl 100 mg.  Total Moderate Sedation Time: 20 minutes.  Procedure: The procedure, risks, benefits, and alternatives were explained to the patient. Questions regarding the procedure were encouraged and answered. The patient understands and consents to the procedure.  The left lower quadrant was prepped with betadine in a sterile fashion, and a sterile drape was applied covering the operative field. A sterile gown and sterile gloves were used for the procedure.  Under CT guidance, an 18 gauge needle was inserted into the left lower quadrant peritoneal abscess and removed over an Amplatz.  A 12-French  dilator followed by a 12-French drain were inserted.  It was looped and string fixed and sewn to the skin.  Serosanguinous fluid with debris was aspirated.  Findings: Left lower quadrant peritoneal abscess drain placement is documented.  Complications: None.  IMPRESSION: Successful left lower quadrant peritoneal abscess drain.  Original Report Authenticated By: Donavan Burnet, M.D.   Ct Abdomen Pelvis W Contrast  03/02/2012  *RADIOLOGY REPORT*  Clinical Data: Recent surgeries for diverticular rupture.  Evaluate for abscess.  CT ABDOMEN AND PELVIS WITH CONTRAST  Technique:  Multidetector CT imaging of the abdomen and pelvis was performed following the standard protocol during bolus administration of intravenous contrast.  Contrast: OMNIPAQUE IOHEXOL 300 MG/ML  SOLN  Comparison: 02/27/2012.  Findings: Lung bases show a small left pleural effusion with bibasilar subpleural atelectasis, left greater than right.  Heart size normal.  No pericardial effusion.  A sub centimeter short axis lymph node is seen adjacent to the distal esophagus.  Liver is unremarkable.  Probable residual vicarious excretion of contrast in the gallbladder.  Adrenal glands and kidneys are unremarkable.  Spleen, pancreas, stomach and small bowel are otherwise unremarkable.  Postoperative changes are seen in the sigmoid colon.  There may be mild wall thickening involving the descending colon.  Left upper quadrant colostomy.  There are several pockets of interloop/peritoneal fluid with hyperattenuating borders.  The largest is seen in the anterior aspect of the left anatomic pelvis, measuring 3.2 x 6.0 cm (image 79).  Multiple areas of associated extraluminal peritoneal air, as seen on 02/27/2012, have resolved in the interval. Open ventral abdominal wound. Small amount of air in the bladder is presumably  iatrogenic.  Atherosclerotic calcification of the arterial vasculature without abdominal aortic aneurysm.  No definite pathologically  enlarged lymph nodes.  No worrisome lytic or sclerotic lesions.  Sclerotic bilateral L5 pars defects without alignment abnormality.  IMPRESSION:  1.  Interval sigmoid colon resection with left upper quadrant colostomy and Hartmann's pouch formation.  Several pockets of peritoneal/interloop fluid with high attenuation borders, most suggestive of abscesses. 2.  Small left pleural effusion.  Original Report Authenticated By: Reyes Ivan, M.D.    Anti-infectives: Anti-infectives     Start     Dose/Rate Route Frequency Ordered Stop   03/01/12 1200   cefTRIAXone (ROCEPHIN) 1 g in dextrose 5 % 50 mL IVPB        1 g 100 mL/hr over 30 Minutes Intravenous Every 24 hours 03/01/12 1138     02/27/12 2100   piperacillin-tazobactam (ZOSYN) IVPB 3.375 g  Status:  Discontinued        3.375 g 12.5 mL/hr over 240 Minutes Intravenous 3 times per day 02/27/12 1226 03/01/12 1138   02/27/12 1230   piperacillin-tazobactam (ZOSYN) IVPB 3.375 g        3.375 g 12.5 mL/hr over 240 Minutes Intravenous To Emergency Dept 02/27/12 1227 02/27/12 1730          Assessment/Plan: s/p Procedure(s) (LRB): EXPLORATORY LAPAROTOMY (N/A) COLOSTOMY (N/A) COLON RESECTION (N/A)  Diverticular abscess drain placed 8/13 Will follow   Romario Tith A 03/04/2012

## 2012-03-04 NOTE — Progress Notes (Signed)
Patient ID: Timothy [name], male   DOB: 1966-05-16, 46 y.o.   MRN: [id number] 6 Days Post-Op  Subjective: No complaints other than some soreness, burping. Reports some gas in colostomy bag.  Objective: Vital signs in last 24 hours: Temp:  [98 F (36.7 C)-98.5 F (36.9 C)] 98.5 F (36.9 C) (08/14 0751) Pulse Rate:  [96-116] 99  (08/14 0415) Resp:  [14-22] 15  (08/14 0415) BP: (134-158)/(74-104) 139/74 mmHg (08/14 0415) SpO2:  [92 %-100 %] 94 % (08/14 0415)    Intake/Output from previous day: 08/13 0701 - 08/14 0700 In: 1605 [P.O.:150; I.V.:1375; NG/GT:30; IV Piggyback:50] Out: 2700 [Urine:1950; Emesis/NG output:750] Intake/Output this shift: Total I/O In: -  Out: 50 [Drains:50]  General appearance:  A/A/O no distress Chest: CTA bilaterally Abdomen: Ostomy appears pink, small amount of fluid in collection bag, report of some "Gas" in bag earlier, Soft, appropriately tender, no bloating. NG output 750 ml over past 24 hrs 50 ml drainage recorded from perc drain over past 24 hrs.  Lab Results:  No results found for this basename: WBC:2,HGB:2,HCT:2,PLT:2 in the last 72 hours BMET No results found for this basename: NA:2,K:2,CL:2,CO2:2,GLUCOSE:2,BUN:2,CREATININE:2,CALCIUM:2 in the last 72 hours PT/INR No results found for this basename: LABPROT:2,INR:2 in the last 72 hours ABG No results found for this basename: PHART:2,PCO2:2,PO2:2,HCO3:2 in the last 72 hours  Studies/Results: Ct Guided Abscess Drain  03/03/2012  *RADIOLOGY REPORT*  Clinical Data/Indication: LEFT LOWER QUADRANT ABSCESS  CT GUIDED ABCESS DRAINAGE WITH CATHETER  Sedation: Versed three mg, Fentanyl 100 mg.  Total Moderate Sedation Time: 20 minutes.  Procedure: The procedure, risks, benefits, and alternatives were explained to the patient. Questions regarding the procedure were encouraged and answered. The patient understands and consents to the procedure.  The left lower quadrant was prepped with betadine in a sterile  fashion, and a sterile drape was applied covering the operative field. A sterile gown and sterile gloves were used for the procedure.  Under CT guidance, an 18 gauge needle was inserted into the left lower quadrant peritoneal abscess and removed over an Amplatz.  A 12-French dilator followed by a 12-French drain were inserted.  It was looped and string fixed and sewn to the skin.  Serosanguinous fluid with debris was aspirated.  Findings: Left lower quadrant peritoneal abscess drain placement is documented.  Complications: None.  IMPRESSION: Successful left lower quadrant peritoneal abscess drain.  Original Report Authenticated By: Donavan Burnet, M.D.   Ct Abdomen Pelvis W Contrast  03/02/2012  *RADIOLOGY REPORT*  Clinical Data: Recent surgeries for diverticular rupture.  Evaluate for abscess.  CT ABDOMEN AND PELVIS WITH CONTRAST  Technique:  Multidetector CT imaging of the abdomen and pelvis was performed following the standard protocol during bolus administration of intravenous contrast.  Contrast: OMNIPAQUE IOHEXOL 300 MG/ML  SOLN  Comparison: 02/27/2012.  Findings: Lung bases show a small left pleural effusion with bibasilar subpleural atelectasis, left greater than right.  Heart size normal.  No pericardial effusion.  A sub centimeter short axis lymph node is seen adjacent to the distal esophagus.  Liver is unremarkable.  Probable residual vicarious excretion of contrast in the gallbladder.  Adrenal glands and kidneys are unremarkable.  Spleen, pancreas, stomach and small bowel are otherwise unremarkable.  Postoperative changes are seen in the sigmoid colon.  There may be mild wall thickening involving the descending colon.  Left upper quadrant colostomy.  There are several pockets of interloop/peritoneal fluid with hyperattenuating borders.  The largest is seen in the anterior aspect of  the left anatomic pelvis, measuring 3.2 x 6.0 cm (image 79).  Multiple areas of associated extraluminal peritoneal  air, as seen on 02/27/2012, have resolved in the interval. Open ventral abdominal wound. Small amount of air in the bladder is presumably iatrogenic.  Atherosclerotic calcification of the arterial vasculature without abdominal aortic aneurysm.  No definite pathologically enlarged lymph nodes.  No worrisome lytic or sclerotic lesions.  Sclerotic bilateral L5 pars defects without alignment abnormality.  IMPRESSION:  1.  Interval sigmoid colon resection with left upper quadrant colostomy and Hartmann's pouch formation.  Several pockets of peritoneal/interloop fluid with high attenuation borders, most suggestive of abscesses. 2.  Small left pleural effusion.  Original Report Authenticated By: Reyes Ivan, M.D.    Anti-infectives: Anti-infectives     Start     Dose/Rate Route Frequency Ordered Stop   03/01/12 1200   cefTRIAXone (ROCEPHIN) 1 g in dextrose 5 % 50 mL IVPB        1 g 100 mL/hr over 30 Minutes Intravenous Every 24 hours 03/01/12 1138     02/27/12 2100   piperacillin-tazobactam (ZOSYN) IVPB 3.375 g  Status:  Discontinued        3.375 g 12.5 mL/hr over 240 Minutes Intravenous 3 times per day 02/27/12 1226 03/01/12 1138   02/27/12 1230   piperacillin-tazobactam (ZOSYN) IVPB 3.375 g        3.375 g 12.5 mL/hr over 240 Minutes Intravenous To Emergency Dept 02/27/12 1227 02/27/12 1730          Assessment/Plan: s/p Procedure(s) (LRB): EXPLORATORY LAPAROTOMY (N/A) COLOSTOMY (N/A) COLON RESECTION (N/A)  Continue NG and bowel rest, IVF. CBC and BMP results pending May be able to go to med-surg floor today (will discuss with Dr. Carolynne Edouard) Will watch for s/s return of bowel function, ? Clamp NG soon. OOB   LOS: 6 days    Timothy [name] 03/04/2012

## 2012-03-04 NOTE — Consult Note (Signed)
WOC follow up Met with pt and his wife today. Explained that [employer] will send paperwork to their home for supplies, wife to fill out and return as soon as possible.  Planning ostomy pouch change tom. With them.  Goal for their independence after this change.    WOC will follow along with you for management of ostomy.  Jemarcus Dougal Woodruff, [place] [phone]

## 2012-03-04 NOTE — Progress Notes (Signed)
Passing flatus into bag. Will d/c ng and start clears. Transfer to floor

## 2012-03-04 NOTE — Progress Notes (Signed)
Pt transferred from dept 3300 to 6N room 11 via hospital bed.  VSS. Continuous pulse ox and resp monitor applied since pt is on PCA.  Pt has no complaints at this time.  Wife at bedside.  Oriented them to room, call bell, and dept 6N.  Will cont to monitor. Sol Blazing Ward

## 2012-03-05 ENCOUNTER — Telehealth: Payer: Self-pay | Admitting: Family Medicine

## 2012-03-05 NOTE — Telephone Encounter (Signed)
Called the patient to see how things were progress. It seems like he is tolerated full liquids well and that his pain is controlled. His wife notes that he is frustrated with being in the hospital, and it's taking a toll on him. I appreciate the care provided by Dr. Carolynne Edouard and the surgery team as well as the radiology team, wound care, and nursing staff.

## 2012-03-05 NOTE — Consult Note (Signed)
WOC ostomy consult  Stoma type/location:  LLQ, end colostomy Stomal assessment/size:  Slightly larger than 1 3/8" round and budded from the skin well.  Stoma pink and moist, does have a couple of necrotic areas one at o'clock and one at 7 o'clock that should slough off with time. Otherwise viable.  Peristomal assessment: intact with no problems Treatment options for stomal/peristomal skin: none needed Output liquid brown and flatus Ostomy pouching:/2pc. 2 3/4" used today, however I believe that next pouch change pt can go down to 2 1/4" wafer [facility] [phone]).  Pts wife has [employer] in the room that I had sent, with 3 pouches and wafers for home. As well sending home supplies from room 4- 2 1/4 wafers and pouches.  Pts wife to complete indigent forms from [employer] once they arrive to the home.    Education provided:  Pt and his wife performed all aspects of ostomy pouch change today.  Pt removed pouching system independently. Wife cleaned around stoma, cut to fit the new wafer, closed the pouch (lock and roll closure) and then attached pouch to wafer with minimal cuing for this. She will practice attaching pouch to wafer with unused pouch/wafer at the bedside.   WOC will follow as needed for ostomy teaching.   Timothy [name], [place] [phone]

## 2012-03-05 NOTE — Progress Notes (Signed)
7 Days Post-Op  Subjective: Still pretty sore, and tender.  Tolerating clear liquids well.  Objective: Vital signs in last 24 hours: Temp:  [98.2 F (36.8 C)-99.1 F (37.3 C)] 98.3 F (36.8 C) (08/15 0600) Pulse Rate:  [87-107] 92  (08/15 0600) Resp:  [14-20] 20  (08/15 0827) BP: (116-150)/(60-86) 137/76 mmHg (08/15 0600) SpO2:  [92 %-99 %] 94 % (08/15 0827) Last BM Date: 03/04/12  Diet: Clears. Afebrile, VSS, lab yesterday ,  Intake/Output from previous day: 08/14 0701 - 08/15 0700 In: 2269.6 [P.O.:480; I.V.:1764.6] Out: 750 [Urine:500; Emesis/NG output:140; Drains:100; Stool:10] Intake/Output this shift:    General appearance: alert, cooperative and no distress Resp: mild wheezing bilat. GI: open abdominal wound with fascial seperation and some soupy areas at different places at the base. Sides look good, clean and pink. More gas and stool in ostomy bag. Drain is clear serous in appearance 100 ml recorded yesterday.  Lab Results:   [facility] 03/04/12 0911  WBC 16.8*  HGB 11.3*  HCT 34.6*  PLT 844*    BMET  Basename 03/04/12 0911  NA 133*  K 3.9  CL 97  CO2 25  GLUCOSE 110*  BUN 7  CREATININE 0.43*  CALCIUM 8.5   PT/INR No results found for this basename: LABPROT:2,INR:2 in the last 72 hours  No results found for this basename: AST:5,ALT:5,ALKPHOS:5,BILITOT:5,PROT:5,ALBUMIN:5 in the last 168 hours   Lipase     Component Value Date/Time   LIPASE 40 10/15/2011 1030     Studies/Results: Ct Guided Abscess Drain  03/03/2012  *RADIOLOGY REPORT*  Clinical Data/Indication: LEFT LOWER QUADRANT ABSCESS  CT GUIDED ABCESS DRAINAGE WITH CATHETER  Sedation: Versed three mg, Fentanyl 100 mg.  Total Moderate Sedation Time: 20 minutes.  Procedure: The procedure, risks, benefits, and alternatives were explained to the patient. Questions regarding the procedure were encouraged and answered. The patient understands and consents to the procedure.  The left lower quadrant was  prepped with betadine in a sterile fashion, and a sterile drape was applied covering the operative field. A sterile gown and sterile gloves were used for the procedure.  Under CT guidance, an 18 gauge needle was inserted into the left lower quadrant peritoneal abscess and removed over an Amplatz.  A 12-French dilator followed by a 12-French drain were inserted.  It was looped and string fixed and sewn to the skin.  Serosanguinous fluid with debris was aspirated.  Findings: Left lower quadrant peritoneal abscess drain placement is documented.  Complications: None.  IMPRESSION: Successful left lower quadrant peritoneal abscess drain.  Original Report Authenticated By: Donavan Burnet, M.D.    Medications:    . cefTRIAXone (ROCEPHIN)  IV  1 g Intravenous Q24H  . enoxaparin  40 mg Subcutaneous Q24H  . HYDROmorphone PCA 0.3 mg/mL   Intravenous Q4H  . pantoprazole (PROTONIX) IV  40 mg Intravenous Q24H  . sodium chloride  500 mL Intravenous Once  . sodium chloride      . sodium chloride        Assessment/Plan Recurrent diverticulits and Perirectal condyloma, s/p laparoscopic sigmoid colectomy, and fulguration of anal wart, 02/19/12. Post op pneumoperitoneum with leak from the sigmoid anastomosis with intraabdominal abscess, colostomy/Hartmans pouch. Intraabdominal abscess with CT guided drain, 03/03/12 NG out  Plan: Full liquids, continue drain and wet to dry dressings. Labs tomorrow.       LOS: 7 days    Timothy [name] 03/05/2012

## 2012-03-05 NOTE — Progress Notes (Signed)
Subjective: Pt ok. No new c/o this am.  Objective: Physical Exam: BP 140/82  Pulse 107  Temp 98.4 F (36.9 C) (Oral)  Resp 17  Ht 5\' 10"  (1.778 m)  Wt 178 lb 12.7 oz (81.1 kg)  BMI 25.65 kg/m2  SpO2 98% LLQ drain intact, site clean, NT Output 100cc yesterday. Serous output this am, though it was just flushed.   Labs: CBC  Basename 03/04/12 0911  WBC 16.8*  HGB 11.3*  HCT 34.6*  PLT 844*   BMET  Basename 03/04/12 0911  NA 133*  K 3.9  CL 97  CO2 25  GLUCOSE 110*  BUN 7  CREATININE 0.43*  CALCIUM 8.5   LFT No results found for this basename: PROT,ALBUMIN,AST,ALT,ALKPHOS,BILITOT,BILIDIR,IBILI,LIPASE in the last 72 hours PT/INR No results found for this basename: LABPROT:2,INR:2 in the last 72 hours   Studies/Results: Ct Guided Abscess Drain  03/03/2012  *RADIOLOGY REPORT*  Clinical Data/Indication: LEFT LOWER QUADRANT ABSCESS  CT GUIDED ABCESS DRAINAGE WITH CATHETER  Sedation: Versed three mg, Fentanyl 100 mg.  Total Moderate Sedation Time: 20 minutes.  Procedure: The procedure, risks, benefits, and alternatives were explained to the patient. Questions regarding the procedure were encouraged and answered. The patient understands and consents to the procedure.  The left lower quadrant was prepped with betadine in a sterile fashion, and a sterile drape was applied covering the operative field. A sterile gown and sterile gloves were used for the procedure.  Under CT guidance, an 18 gauge needle was inserted into the left lower quadrant peritoneal abscess and removed over an Amplatz.  A 12-French dilator followed by a 12-French drain were inserted.  It was looped and string fixed and sewn to the skin.  Serosanguinous fluid with debris was aspirated.  Findings: Left lower quadrant peritoneal abscess drain placement is documented.  Complications: None.  IMPRESSION: Successful left lower quadrant peritoneal abscess drain.  Original Report Authenticated By: Donavan Burnet, M.D.     Assessment/Plan: Diverticular abscess drain placed 8/13  Will follow Repeat CT, poss drain injection if concern for fistula when output diminishes.    LOS: 7 days    Brayton El PA-C 03/05/2012 10:04 AM

## 2012-03-05 NOTE — Progress Notes (Signed)
He has some fascial dehiscence but his abd is stuck. Continue dressing changes and advance to fulls today

## 2012-03-06 LAB — COMPREHENSIVE METABOLIC PANEL
AST: 27 U/L (ref 0–37)
Albumin: 1.8 g/dL — ABNORMAL LOW (ref 3.5–5.2)
Alkaline Phosphatase: 191 U/L — ABNORMAL HIGH (ref 39–117)
BUN: 5 mg/dL — ABNORMAL LOW (ref 6–23)
Potassium: 4.9 mEq/L (ref 3.5–5.1)
Sodium: 134 mEq/L — ABNORMAL LOW (ref 135–145)
Total Protein: 5.7 g/dL — ABNORMAL LOW (ref 6.0–8.3)

## 2012-03-06 LAB — CULTURE, ROUTINE-ABSCESS

## 2012-03-06 LAB — CBC
HCT: 32.5 % — ABNORMAL LOW (ref 39.0–52.0)
MCHC: 32 g/dL (ref 30.0–36.0)
Platelets: 835 10*3/uL — ABNORMAL HIGH (ref 150–400)
RDW: 15.4 % (ref 11.5–15.5)
WBC: 12.6 10*3/uL — ABNORMAL HIGH (ref 4.0–10.5)

## 2012-03-06 MED ORDER — OXYCODONE-ACETAMINOPHEN 5-325 MG PO TABS
1.0000 | ORAL_TABLET | ORAL | Status: DC | PRN
  Administered 2012-03-06 – 2012-03-10 (×20): 2 via ORAL
  Filled 2012-03-06 (×21): qty 2

## 2012-03-06 MED ORDER — ENSURE COMPLETE PO LIQD
237.0000 mL | Freq: Every day | ORAL | Status: DC
  Administered 2012-03-06 – 2012-03-07 (×2): 237 mL via ORAL

## 2012-03-06 MED ORDER — HYDROMORPHONE HCL PF 1 MG/ML IJ SOLN: 1.0000 mg | INTRAMUSCULAR | Status: DC | PRN

## 2012-03-06 NOTE — Progress Notes (Signed)
8 Days Post-Op  Subjective: Wants to go home and be off PCA  Objective: Vital signs in last 24 hours: Temp:  [97.6 F (36.4 C)-99 F (37.2 C)] 97.6 F (36.4 C) (08/16 1027) Pulse Rate:  [89-112] 105  (08/16 1027) Resp:  [13-38] 18  (08/16 1027) BP: (133-151)/(71-85) 135/85 mmHg (08/16 1027) SpO2:  [62 %-98 %] 98 % (08/16 1027) FiO2 (%):  [24 %] 24 % (08/16 0432) Last BM Date: 03/04/12  Output not recorded yesterday aside from urine output. Tm99 VSS, labs are better, WBC slowly improving.  Diet: full liquids   Intake/Output from previous day: 08/15 0701 - 08/16 0700 In: 5000 [I.V.:5000] Out: 925 [Urine:925] Intake/Output this shift: Total I/O In: 245 [P.O.:240; Other:5] Out: 1810 [Urine:1800; Drains:10]  General appearance: alert, cooperative and no distress Resp: clear to auscultation bilaterally GI: soft, tender looking at base of the wound, air in ostomy and some clear fluid, but no stool so far.  Lab Results:   [facility] 03/06/12 0635 03/04/12 0911  WBC 12.6* 16.8*  HGB 10.4* 11.3*  HCT 32.5* 34.6*  PLT 835* 844*    BMET  Basename 03/06/12 0635 03/04/12 0911  NA 134* 133*  K 4.9 3.9  CL 97 97  CO2 29 25  GLUCOSE 110* 110*  BUN 5* 7  CREATININE 0.55 0.43*  CALCIUM 8.8 8.5   PT/INR No results found for this basename: LABPROT:2,INR:2 in the last 72 hours   Lab 03/06/12 0635  AST 27  ALT 46  ALKPHOS 191*  BILITOT 0.7  PROT 5.7*  ALBUMIN 1.8*     Lipase     Component Value Date/Time   LIPASE 40 10/15/2011 1030     Studies/Results: No results found.  Medications:    . cefTRIAXone (ROCEPHIN)  IV  1 g Intravenous Q24H  . enoxaparin  40 mg Subcutaneous Q24H  . HYDROmorphone PCA 0.3 mg/mL   Intravenous Q4H  . pantoprazole (PROTONIX) IV  40 mg Intravenous Q24H  . sodium chloride  500 mL Intravenous Once    Assessment/Plan Recurrent diverticulits and Perirectal condyloma, s/p laparoscopic sigmoid colectomy, and fulguration of anal wart,  02/19/12.  Post op pneumoperitoneum with leak from the sigmoid anastomosis with intraabdominal abscess, colostomy/Hartmans pouch.  Intraabdominal abscess with CT guided drain, 03/03/12 NG out  Plan:  Continue wet to dry dressings, antibiotics, keep at full liquids for now, IV tylenol for pain, Drainage from the [facility] DRAIN is clear, and very little recorded.     LOS: 8 days    Timothy [name] 03/06/2012

## 2012-03-06 NOTE — Progress Notes (Signed)
INITIAL ADULT NUTRITION ASSESSMENT Date: 03/06/2012   Time: 3:20 PM Reason for Assessment: Poor PO  ASSESSMENT: Male 46 y.o.  Dx: Intra-abdominal abscesses  Past Medical History  Diagnosis Date  . Hypoglycemia   . GERD (gastroesophageal reflux disease)   . Diverticulosis     By colonoscopy June 2005  . Lower GI bleed     June 2005. Presumably secondary to diverticulosis.  . Active smoker   . Anemia   . Anxiety   . Alcohol abuse   . Hemorrhoids   . Family history of anesthesia complication     Mother N/V  . Bronchitis     history  . Headache     with hypoglycemia  . Cellulitis     Hx    . Glaucoma syndrome     does not use eye drops, "They burn".  . Sleep apnea     witness apnea by wife.  . Elevated CK 3/13  . H/O Prinzmetal angina     has not experienced in > 1 month.   Colostomy and colon resection on 02/27/12  Scheduled Meds:    . cefTRIAXone (ROCEPHIN)  IV  1 g Intravenous Q24H  . enoxaparin  40 mg Subcutaneous Q24H  . pantoprazole (PROTONIX) IV  40 mg Intravenous Q24H  . sodium chloride  500 mL Intravenous Once  . DISCONTD: HYDROmorphone PCA 0.3 mg/mL   Intravenous Q4H   Continuous Infusions:    . dextrose 5 % and 0.45 % NaCl with KCl 20 mEq/L 125 mL/hr at 03/06/12 1154   PRN Meds:.HYDROmorphone (DILAUDID) injection, ondansetron (ZOFRAN) IV, ondansetron, oxyCODONE-acetaminophen, phenol, DISCONTD: diphenhydrAMINE, DISCONTD: diphenhydrAMINE, DISCONTD: naloxone, DISCONTD: sodium chloride   Ht: 5\' 10"  (177.8 cm)  Wt: 178 lb 12.7 oz (81.1 kg) 02/29/12- no new weight 02/27/12: 183 lb 02/25/12: 174 lb 02/20/12: 184 lb 02/13/12: 185 lb  Ideal Wt: 75.5 kg  % Ideal Wt: 107%  Usual Wt: 185 lb (per pt) % Usual Wt: 96%  Body mass index is 25.65 kg/(m^2).  Food/Nutrition Related Hx:  Left lower quadrant peritoneal abscess drain placed 03/03/12. Pt reports tolerating diet well and has a good appetite. Pt states he thinks he has lost close to 30 lbs- unsure of  actual weight loss due to not having recent weight. Pt willing to try Ensure.  Labs:  CMP     Component Value Date/Time   NA 134* 03/06/2012 0635   K 4.9 03/06/2012 0635   CL 97 03/06/2012 0635   CO2 29 03/06/2012 0635   GLUCOSE 110* 03/06/2012 0635   BUN 5* 03/06/2012 0635   CREATININE 0.55 03/06/2012 0635   CREATININE 0.83 11/04/2011 1115   CALCIUM 8.8 03/06/2012 0635   PROT 5.7* 03/06/2012 0635   ALBUMIN 1.8* 03/06/2012 0635   AST 27 03/06/2012 0635   ALT 46 03/06/2012 0635   ALKPHOS 191* 03/06/2012 0635   BILITOT 0.7 03/06/2012 0635   GFRNONAA >90 03/06/2012 0635   GFRAA >90 03/06/2012 0635     Intake/Output Summary (Last 24 hours) at 03/06/12 1520 Last data filed at 03/06/12 1500  Gross per 24 hour  Intake 6082.08 ml  Output   3060 ml  Net 3022.08 ml    Diet Order: Regular- just started; previously full liquid (50-75% PO intake documented)  Supplements/Tube Feeding: None at this time.  IVF:     dextrose 5 % and 0.45 % NaCl with KCl 20 mEq/L Last Rate: 125 mL/hr at 03/06/12 1154    Estimated Nutritional Needs:  Kcal: 2200-2400 Protein: 95-105 gm Fluid: >2.4 L  NUTRITION DIAGNOSIS: -Inadequate oral intake (NI-2.1).  Status: Ongoing  RELATED TO: intra-abdominal abscess  AS EVIDENCE BY: NPO/clear liquid/full liquid diet x 8 days  MONITORING/EVALUATION(Goals): Goal: Pt to tolerate regular diet, consuming >75% meals. Monitor: PO intake, weight, labs  EDUCATION NEEDS: -No education needs identified at this time  INTERVENTION: Encourage PO intake as tolerated. Ensure Complete once daily.   DOCUMENTATION CODES Per approved criteria  -Non-severe (moderate) malnutrition in the context of acute illness or injury    Leonette Most 03/06/2012, 3:20 PM

## 2012-03-06 NOTE — Progress Notes (Signed)
Advance diet. D/C PCA. Ambulate

## 2012-03-06 NOTE — Progress Notes (Signed)
8 Days Post-Op  Subjective: Pt doing ok, eager to go home; no new c/o  Objective: Vital signs in last 24 hours: Temp:  [98.1 F (36.7 C)-99 F (37.2 C)] 98.4 F (36.9 C) (08/16 0548) Pulse Rate:  [89-112] 89  (08/16 0548) Resp:  [13-38] 16  (08/16 0800) BP: (133-151)/(71-82) 133/71 mmHg (08/16 0548) SpO2:  [62 %-98 %] 96 % (08/16 0800) FiO2 (%):  [24 %] 24 % (08/16 0432) Last BM Date: 03/04/12  Intake/Output from previous day: 08/15 0701 - 08/16 0700 In: 5000 [I.V.:5000] Out: 925 [Urine:925] Intake/Output this shift: Total I/O In: 5 [Other:5] Out: 10 [Drains:10]  LLQ drain intact, output about 10 cc's today serous fluid, cx's pend (prob e coli), insertion site ok, mildly tender  Lab Results:   [facility] 03/06/12 0635 03/04/12 0911  WBC 12.6* 16.8*  HGB 10.4* 11.3*  HCT 32.5* 34.6*  PLT 835* 844*   BMET  Basename 03/06/12 0635 03/04/12 0911  NA 134* 133*  K 4.9 3.9  CL 97 97  CO2 29 25  GLUCOSE 110* 110*  BUN 5* 7  CREATININE 0.55 0.43*  CALCIUM 8.8 8.5   PT/INR No results found for this basename: LABPROT:2,INR:2 in the last 72 hours ABG No results found for this basename: PHART:2,PCO2:2,PO2:2,HCO3:2 in the last 72 hours Results for orders placed during the hospital encounter of 02/27/12  BODY FLUID CULTURE     Status: Normal   Collection Time   02/27/12  1:30 PM      Component Value Range Status Comment   Specimen Description PERITONEAL   Final    Special Requests NONE   Final    Gram Stain     Final    Value: FEW WBC PRESENT, PREDOMINANTLY PMN     FEW GRAM NEGATIVE RODS   Culture MODERATE ESCHERICHIA COLI   Final    Report Status 02/29/2012 FINAL   Final    Organism ID, Bacteria ESCHERICHIA COLI   Final   CULTURE, ROUTINE-ABSCESS     Status: Normal (Preliminary result)   Collection Time   03/03/12 11:56 AM      Component Value Range Status Comment   Specimen Description ABSCESS PERITONEAL CAVITY   Final    Special Requests NONE   Final    Gram Stain      Final    Value: ABUNDANT WBC PRESENT, PREDOMINANTLY PMN     NO SQUAMOUS EPITHELIAL CELLS SEEN     RARE GRAM POSITIVE COCCI IN PAIRS   Culture MODERATE GRAM NEGATIVE RODS   Final    Report Status PENDING   Incomplete     Studies/Results: No results found.  Anti-infectives: Anti-infectives     Start     Dose/Rate Route Frequency Ordered Stop   03/01/12 1200   cefTRIAXone (ROCEPHIN) 1 g in dextrose 5 % 50 mL IVPB        1 g 100 mL/hr over 30 Minutes Intravenous Every 24 hours 03/01/12 1138     02/27/12 2100   piperacillin-tazobactam (ZOSYN) IVPB 3.375 g  Status:  Discontinued        3.375 g 12.5 mL/hr over 240 Minutes Intravenous 3 times per day 02/27/12 1226 03/01/12 1138   02/27/12 1230  piperacillin-tazobactam (ZOSYN) IVPB 3.375 g       3.375 g 12.5 mL/hr over 240 Minutes Intravenous To Emergency Dept 02/27/12 1227 02/27/12 1730          Assessment/Plan: S/P LLQ divert abscess drainage 8/13; check f/u CT with poss  drain injection in next few days esp if output remains minimal despite flushes      LOS: 8 days    ALLRED,D [facility] 03/06/2012

## 2012-03-07 NOTE — Progress Notes (Signed)
Improving, abdomen soft.  In good spirits. Patient examined and I agree with the assessment and plan  Violeta Gelinas, MD, MPH, FACS Pager: [phone]  03/07/2012 12:15 PM

## 2012-03-07 NOTE — Progress Notes (Signed)
Patient ID: Timothy [name], male   DOB: 29-Feb-1966, 46 y.o.   MRN: [id number] 9 Days Post-Op  Subjective:  Feels good this morning; reports that he had a "large" BM last night, no other C/O offered.  Objective: Vital signs in last 24 hours: Temp:  [97.6 F (36.4 C)-98.6 F (37 C)] 97.8 F (36.6 C) (08/17 0607) Pulse Rate:  [75-111] 75  (08/17 0607) Resp:  [16-20] 16  (08/17 0607) BP: (121-135)/(63-85) 121/65 mmHg (08/17 0607) SpO2:  [94 %-99 %] 96 % (08/17 0607) Last BM Date: 03/04/12    Intake/Output from previous day: 08/16 0701 - 08/17 0700 In: 2087.1 [P.O.:720; I.V.:1302.1; IV Piggyback:50] Out: 4965 [Urine:4950; Drains:15] Intake/Output this shift:    General appearance: alert, cooperative and no distress Resp: clear to auscultation bilaterally GI: soft, tender looking at base of the wound, air in ostomy and some clear fluid, but no stool so far. Patient states that he had a large BM last night (not recorded) some stool in bag this am. VSS,afebrile WBC trending downward. As of 03/06/12, no labs this am. Perc drain: 15 ml/24 hr recorded. Labs stable.  Lab Results:   [facility] 03/06/12 0635 03/04/12 0911  WBC 12.6* 16.8*  HGB 10.4* 11.3*  HCT 32.5* 34.6*  PLT 835* 844*    BMET  Basename 03/06/12 0635 03/04/12 0911  NA 134* 133*  K 4.9 3.9  CL 97 97  CO2 29 25  GLUCOSE 110* 110*  BUN 5* 7  CREATININE 0.55 0.43*  CALCIUM 8.8 8.5   PT/INR No results found for this basename: LABPROT:2,INR:2 in the last 72 hours   Lab 03/06/12 0635  AST 27  ALT 46  ALKPHOS 191*  BILITOT 0.7  PROT 5.7*  ALBUMIN 1.8*     Lipase     Component Value Date/Time   LIPASE 40 10/15/2011 1030     Studies/Results: No results found.  Medications:    . cefTRIAXone (ROCEPHIN)  IV  1 g Intravenous Q24H  . enoxaparin  40 mg Subcutaneous Q24H  . feeding supplement  237 mL Oral QAC supper  . pantoprazole (PROTONIX) IV  40 mg Intravenous Q24H  . sodium chloride  500 mL  Intravenous Once  . DISCONTD: HYDROmorphone PCA 0.3 mg/mL   Intravenous Q4H    Assessment/Plan Recurrent diverticulits and Perirectal condyloma, s/p laparoscopic sigmoid colectomy, and fulguration of anal wart, 02/19/12.  Post op pneumoperitoneum with leak from the sigmoid anastomosis with intraabdominal abscess, colostomy/Hartmans pouch.  Intraabdominal abscess with CT guided drain, 03/03/12 NG out  Plan:  Continue wet to dry dressings, antibiotics, He has been advanced to regular diet. IV tylenol for pain Drainage from the [facility] DRAIN is clear and minimal, ? D/c soon will discuss with Dr. Elberta Fortis.     LOS: 9 days    Aras Albarran 03/07/2012

## 2012-03-07 NOTE — Progress Notes (Signed)
Subjective: Pt ok, no new c/o  Objective: Physical Exam: BP 121/65  Pulse 75  Temp 97.8 F (36.6 C) (Oral)  Resp 16  Ht 5\' 10"  (1.778 m)  Wt 178 lb 12.7 oz (81.1 kg)  BMI 25.65 kg/m2  SpO2 96% Drain intact, scant output, only returning what is flushed.    Labs: CBC  Basename 03/06/12 0635  WBC 12.6*  HGB 10.4*  HCT 32.5*  PLT 835*   BMET  Basename 03/06/12 0635  NA 134*  K 4.9  CL 97  CO2 29  GLUCOSE 110*  BUN 5*  CREATININE 0.55  CALCIUM 8.8   LFT  Basename 03/06/12 0635  PROT 5.7*  ALBUMIN 1.8*  AST 27  ALT 46  ALKPHOS 191*  BILITOT 0.7  BILIDIR --  IBILI --  LIPASE --   PT/INR No results found for this basename: LABPROT:2,INR:2 in the last 72 hours   Studies/Results: No results found.  Assessment/Plan: S/P LLQ post operative abscess drainage 8/13; check f/u CT  +/- drain injection in next few days to reassess and possibly remove drain prior to discharge.   LOS: 9 days    Brayton El PA-C 03/07/2012 10:52 AM

## 2012-03-08 MED ORDER — PANTOPRAZOLE SODIUM 40 MG PO TBEC
40.0000 mg | DELAYED_RELEASE_TABLET | Freq: Every day | ORAL | Status: DC
  Administered 2012-03-08 – 2012-03-09 (×2): 40 mg via ORAL
  Filled 2012-03-08 (×2): qty 1

## 2012-03-08 NOTE — Progress Notes (Signed)
10 Days Post-Op  Subjective: No complaints Ostomy working well Tolerating po  Objective: Vital signs in last 24 hours: Temp:  [98 F (36.7 C)-99.1 F (37.3 C)] 98.3 F (36.8 C) (08/18 0527) Pulse Rate:  [85-97] 85  (08/18 0527) Resp:  [16-18] 16  (08/18 0527) BP: (122-144)/(58-83) 122/60 mmHg (08/18 0527) SpO2:  [97 %-98 %] 97 % (08/18 0527) Last BM Date: 03/07/12  Intake/Output from previous day: 08/17 0701 - 08/18 0700 In: 720 [P.O.:720] Out: 2850 [Urine:2800; Stool:50] Intake/Output this shift:    Wound stable Ostomy viable, productive Abdomen non tender  Lab Results:   [facility] 03/06/12 0635  WBC 12.6*  HGB 10.4*  HCT 32.5*  PLT 835*   BMET  Basename 03/06/12 0635  NA 134*  K 4.9  CL 97  CO2 29  GLUCOSE 110*  BUN 5*  CREATININE 0.55  CALCIUM 8.8   PT/INR No results found for this basename: LABPROT:2,INR:2 in the last 72 hours ABG No results found for this basename: PHART:2,PCO2:2,PO2:2,HCO3:2 in the last 72 hours  Studies/Results: No results found.  Anti-infectives: Anti-infectives     Start     Dose/Rate Route Frequency Ordered Stop   03/01/12 1200   cefTRIAXone (ROCEPHIN) 1 g in dextrose 5 % 50 mL IVPB        1 g 100 mL/hr over 30 Minutes Intravenous Every 24 hours 03/01/12 1138     02/27/12 2100   piperacillin-tazobactam (ZOSYN) IVPB 3.375 g  Status:  Discontinued        3.375 g 12.5 mL/hr over 240 Minutes Intravenous 3 times per day 02/27/12 1226 03/01/12 1138   02/27/12 1230   piperacillin-tazobactam (ZOSYN) IVPB 3.375 g        3.375 g 12.5 mL/hr over 240 Minutes Intravenous To Emergency Dept 02/27/12 1227 02/27/12 1730          Assessment/Plan: s/p Procedure(s) (LRB): EXPLORATORY LAPAROTOMY (N/A) COLOSTOMY (N/A) COLON RESECTION (N/A)  Continue wound care, ostomy care, antibiotics Home soon  LOS: 10 days    Yao Hyppolite A 03/08/2012

## 2012-03-09 ENCOUNTER — Encounter (HOSPITAL_COMMUNITY): Payer: Self-pay | Admitting: Radiology

## 2012-03-09 ENCOUNTER — Inpatient Hospital Stay (HOSPITAL_COMMUNITY): Payer: Medicaid Other

## 2012-03-09 MED ORDER — IOHEXOL 300 MG/ML  SOLN
100.0000 mL | Freq: Once | INTRAMUSCULAR | Status: AC | PRN
  Administered 2012-03-09: 100 mL via INTRAVENOUS

## 2012-03-09 MED ORDER — IOHEXOL 300 MG/ML  SOLN
20.0000 mL | INTRAMUSCULAR | Status: AC
  Administered 2012-03-09 (×2): 20 mL via ORAL

## 2012-03-09 NOTE — Progress Notes (Signed)
11 Days Post-Op  Subjective: No complaints  Objective: Vital signs in last 24 hours: Temp:  [97.7 F (36.5 C)-99.2 F (37.3 C)] 97.7 F (36.5 C) (08/19 0616) Pulse Rate:  [97-99] 97  (08/19 0616) Resp:  [18] 18  (08/19 0616) BP: (120-134)/(72-81) 134/72 mmHg (08/19 0616) SpO2:  [96 %-97 %] 96 % (08/19 0616) Last BM Date: 03/09/12  Intake/Output from previous day: 08/18 0701 - 08/19 0700 In: 480 [P.O.:480] Out: 2721 [Urine:2700; Drains:20; Stool:1] Intake/Output this shift: Total I/O In: 5 [Other:5] Out: 0   GI: soft, nontender. open wound stable. ostomy productive  Lab Results:  No results found for this basename: WBC:2,HGB:2,HCT:2,PLT:2 in the last 72 hours BMET No results found for this basename: NA:2,K:2,CL:2,CO2:2,GLUCOSE:2,BUN:2,CREATININE:2,CALCIUM:2 in the last 72 hours PT/INR No results found for this basename: LABPROT:2,INR:2 in the last 72 hours ABG No results found for this basename: PHART:2,PCO2:2,PO2:2,HCO3:2 in the last 72 hours  Studies/Results: No results found.  Anti-infectives: Anti-infectives     Start     Dose/Rate Route Frequency Ordered Stop   03/01/12 1200   cefTRIAXone (ROCEPHIN) 1 g in dextrose 5 % 50 mL IVPB        1 g 100 mL/hr over 30 Minutes Intravenous Every 24 hours 03/01/12 1138     02/27/12 2100   piperacillin-tazobactam (ZOSYN) IVPB 3.375 g  Status:  Discontinued        3.375 g 12.5 mL/hr over 240 Minutes Intravenous 3 times per day 02/27/12 1226 03/01/12 1138   02/27/12 1230   piperacillin-tazobactam (ZOSYN) IVPB 3.375 g        3.375 g 12.5 mL/hr over 240 Minutes Intravenous To Emergency Dept 02/27/12 1227 02/27/12 1730          Assessment/Plan: s/p Procedure(s) (LRB): EXPLORATORY LAPAROTOMY (N/A) COLOSTOMY (N/A) COLON RESECTION (N/A) Will get CT today to look at drain. if it looks good we may be able to d/c drain.  Hopefully ready for d/c tomorrow  LOS: 11 days    TOTH III,PAUL S 03/09/2012

## 2012-03-09 NOTE — Consult Note (Signed)
WOC follow up Stoma type/location: LLQ, end colostomy Stomal assessment/size: 1 5/8" round, slightly budded from the skin Peristomal assessment: did not change pouch today, pt reports skin looks good under wafer with pouch change on Saturday. May need to add 2" barrier ring with pouch if leaking becomes an issue.  I think that his leakage over the weekend was from large BM and not realizing that he needed to empty that frequently.   Treatment options for stomal/peristomal skin: none at current time Output form green stool in the pouch Ostomy pouching: 2pc. In place.  Providing pt with 2 1/4" wafers and pouches to go home with.  Education provided: Provided large amounts of emotional support for pt today.  He was upset from leakage over the weekend and the need to change without WOC present.  I have explained that he will have to learn from this and make sure he is always prepared for change.  He does not want to do pouch change today and feels that he can do this himself.  I have observed wife to change pouching system.  Demonstrated how to burp gas from the top of the pouch and this was very helpful to pt.  Explained frequency for emptying and pouch change.  Pts wife completing [employer] assistance paperwork while I am in the room today and she plans to mail this back in today.  Discussed emptying his pouch and the need to aim pouch downward from now on to make empty easier.  Would recommend [facility] for continued support with wound care and ostomy teaching.  Antara Brecheisen [place], [place] [phone]

## 2012-03-09 NOTE — Progress Notes (Signed)
11 Days Post-Op  Subjective: Pt doing well; anxious to go home  Objective: Vital signs in last 24 hours: Temp:  [97.7 F (36.5 C)-99.2 F (37.3 C)] 97.7 F (36.5 C) (08/19 0616) Pulse Rate:  [97-99] 97  (08/19 0616) Resp:  [18] 18  (08/19 0616) BP: (120-134)/(72-81) 134/72 mmHg (08/19 0616) SpO2:  [96 %-97 %] 96 % (08/19 0616) Last BM Date: 03/09/12  Intake/Output from previous day: 08/18 0701 - 08/19 0700 In: 480 [P.O.:480] Out: 2721 [Urine:2700; Drains:20; Stool:1] Intake/Output this shift: Total I/O In: 5 [Other:5] Out: 0   LLQ drain intact, output minimal , site mildly tender, cx's- e coli  Lab Results:  No results found for this basename: WBC:2,HGB:2,HCT:2,PLT:2 in the last 72 hours BMET No results found for this basename: NA:2,K:2,CL:2,CO2:2,GLUCOSE:2,BUN:2,CREATININE:2,CALCIUM:2 in the last 72 hours PT/INR No results found for this basename: LABPROT:2,INR:2 in the last 72 hours ABG No results found for this basename: PHART:2,PCO2:2,PO2:2,HCO3:2 in the last 72 hours  Studies/Results: No results found.  Anti-infectives: Anti-infectives     Start     Dose/Rate Route Frequency Ordered Stop   03/01/12 1200   cefTRIAXone (ROCEPHIN) 1 g in dextrose 5 % 50 mL IVPB        1 g 100 mL/hr over 30 Minutes Intravenous Every 24 hours 03/01/12 1138     02/27/12 2100   piperacillin-tazobactam (ZOSYN) IVPB 3.375 g  Status:  Discontinued        3.375 g 12.5 mL/hr over 240 Minutes Intravenous 3 times per day 02/27/12 1226 03/01/12 1138   02/27/12 1230   piperacillin-tazobactam (ZOSYN) IVPB 3.375 g        3.375 g 12.5 mL/hr over 240 Minutes Intravenous To Emergency Dept 02/27/12 1227 02/27/12 1730          Assessment/Plan: s/p LLQ diverticular abscess drainage 8/13; rec f/u CT with poss drain injection next 1-2 days (final plans per [facility])      LOS: 11 days    Willa Brocks,D [facility] 03/09/2012

## 2012-03-09 NOTE — Progress Notes (Signed)
I came by this morning to visit Mr. Nedved. He states that he is feeling much better and hopeful to be able to be discharged today. We talked at length about his anxiety at home that is compounded by his illness and being out of work. He is also trying to quit smoking cigarettes and drinking alcohol, which I encouraged. I would like to start medications for his anxiety, specifically Lexapro 10 mg daily and buspirone 10 mg BID. These could be started while an inpatient, but I am not authorized to do so as he is under the care of the general surgery team. I am comfortable with Dr. Carolynne Edouard or his team giving the initial prescription until I see the patient in my clinic. If Dr. Carolynne Edouard would prefer not to do this, then I certainly understand.   Please page me with any questions or concerns.   Thank You,   Roslynn Amble, MD [phone]

## 2012-03-10 ENCOUNTER — Other Ambulatory Visit: Payer: Self-pay | Admitting: Family Medicine

## 2012-03-10 DIAGNOSIS — F410 Panic disorder [episodic paroxysmal anxiety] without agoraphobia: Secondary | ICD-10-CM

## 2012-03-10 MED ORDER — ESCITALOPRAM OXALATE 10 MG PO TABS: 10.0000 mg | ORAL_TABLET | Freq: Every day | ORAL | Status: DC

## 2012-03-10 MED ORDER — OXYCODONE-ACETAMINOPHEN 5-325 MG PO TABS: 1.0000 | ORAL_TABLET | ORAL | Status: DC | PRN

## 2012-03-10 MED ORDER — CLONAZEPAM 0.5 MG PO TABS: 0.5000 mg | ORAL_TABLET | Freq: Two times a day (BID) | ORAL | Status: DC | PRN

## 2012-03-10 NOTE — Telephone Encounter (Signed)
Timothy [name] and I talked at length about his anxiety while he was hospitalized. I was not able to start any medication during the hospitalization, because I was not formally consulted. Therefore, I send prescriptions to his outpatient pharmacy, and he can follow up within one week.

## 2012-03-10 NOTE — Consult Note (Signed)
WOC follow up Stoma type/location:  LLQ, end colostomy Stomal assessment/size:  1 3/8" round, slightly budded, pink and moist Peristomal assessment: intact without problems, I have suggested to perform dry shave with next pouch change of his peristomal area.  Instructions provided for this.  Treatment options for stomal/peristomal skin: none needed, will need routine shaving of this area for prevention of folliculitis  Output form brown-green stool in pouch Ostomy pouching: 2pc. 2 1/4" pouch.  Pt removed old pouch today, cleaned around his stoma, cut new wafer to fit, applied new wafer, and secured new pouch to flange of wafer (he did require some minimal assistance with this).   Education provided: Taught pt how to use toilet paper wick to clean the bottom of his pouch, reviewed empty when 1/3 to 1/2 full. Reviewed diet and activity.  Answered all questions from him and his wife. Feel they are ready to dc home.  Wife to perform dressing change for midline incision with nurse today.  Would benefit from [facility] for continued support and ostomy teaching. Will need next pouch change Friday 03/13/12.  Supplies sent home with pt.    Re consult if needed, will not follow at this time. Thanks  Rekisha Welling [employer], CWOCN [phone])

## 2012-03-10 NOTE — Progress Notes (Signed)
Rn taught [name] patient's wife how to do dressing change. She demonstrated how to do the dressing change and had no questions regarding it.

## 2012-03-10 NOTE — Progress Notes (Signed)
12 Days Post-Op  Subjective: No complaints  Objective: Vital signs in last 24 hours: Temp:  [98.2 F (36.8 C)-99 F (37.2 C)] 98.5 F (36.9 C) (08/20 0553) Pulse Rate:  [85-110] 85  (08/20 0553) Resp:  [16-18] 16  (08/20 0553) BP: (116-136)/(66-85) 116/66 mmHg (08/20 0553) SpO2:  [97 %-99 %] 97 % (08/20 0553) Last BM Date: 03/09/12  Intake/Output from previous day: 08/19 0701 - 08/20 0700 In: 495 [P.O.:480] Out: 3110 [Urine:3100; Drains:10] Intake/Output this shift:    GI: soft, nontender. open wound clean. ostomy pink  Lab Results:  No results found for this basename: WBC:2,HGB:2,HCT:2,PLT:2 in the last 72 hours BMET No results found for this basename: NA:2,K:2,CL:2,CO2:2,GLUCOSE:2,BUN:2,CREATININE:2,CALCIUM:2 in the last 72 hours PT/INR No results found for this basename: LABPROT:2,INR:2 in the last 72 hours ABG No results found for this basename: PHART:2,PCO2:2,PO2:2,HCO3:2 in the last 72 hours  Studies/Results: Ct Abdomen Pelvis W Contrast  03/09/2012  *RADIOLOGY REPORT*  Clinical Data: Followup abdominal drain placement 03/03/2012. Patient feeling better better.  No more drainage. Surgery for diverticular rupture.  CT ABDOMEN AND PELVIS WITH CONTRAST  Technique:  Multidetector CT imaging of the abdomen and pelvis was performed following the standard protocol during bolus administration of intravenous contrast.  Contrast: OMNIPAQUE IOHEXOL 300 MG/ML  SOLN  Comparison: 03/03/2012 and 03/02/2012 CT.  Findings: Post left-sided/sigmoid colectomy with colostomy left upper quadrant.  Drain has been placed in the left lower quadrant/pelvic collection. Interval significant improvement with decrease in size although incomplete resolution of the abscess/fluid collection within the left pelvis. Residual abscess/fluid is prominently located anterior and medial to the surgical drain with maximal transverse dimension of 4.4 x 1.4 cm and 3.3 x 2.3 cm.  Residual left-sided pleural  effusion and basilar atelectasis.  Persistent posterior-superior perisplenic loculated collection measures 10.8 x 6.9 x 2.3 cm.  Infection not excluded.  Relatively similar to the prior exam.  Superior lateral to the splenic flexure, 3 x 2.4 x 1.8 cm collection consistent with small abscess without significant change.  Fatty infiltration of the liver without focal liver mass.  Dilated gallbladder without calcified gallstones.  No pancreatic, adrenal or renal mass.  Large anterior abdominal incision healing by secondary intent.  Atherosclerotic type changes of the aorta and iliac arteries advance for patient'[name] age.  Portions of small bowel prominent in size without point of obstruction identified.  No free intraperitoneal air.  Bilateral L5 pars defects.  IMPRESSION: Interval improvement although incomplete clearance of postoperative abscesses as noted above.   Original Report Authenticated By: Fuller Canada, M.D.     Anti-infectives: Anti-infectives     Start     Dose/Rate Route Frequency Ordered Stop   03/01/12 1200   cefTRIAXone (ROCEPHIN) 1 g in dextrose 5 % 50 mL IVPB        1 g 100 mL/hr over 30 Minutes Intravenous Every 24 hours 03/01/12 1138     02/27/12 2100   piperacillin-tazobactam (ZOSYN) IVPB 3.375 g  Status:  Discontinued        3.375 g 12.5 mL/hr over 240 Minutes Intravenous 3 times per day 02/27/12 1226 03/01/12 1138   02/27/12 1230   piperacillin-tazobactam (ZOSYN) IVPB 3.375 g        3.375 g 12.5 mL/hr over 240 Minutes Intravenous To Emergency Dept 02/27/12 1227 02/27/12 1730          Assessment/Plan: [name]/p Procedure([name]) (LRB): EXPLORATORY LAPAROTOMY (N/A) COLOSTOMY (N/A) COLON RESECTION (N/A) Discharge D/C drain Arrange for home health  LOS: 12 days  Timothy [name],Timothy [name] 03/10/2012

## 2012-03-10 NOTE — Progress Notes (Signed)
Pt will need HHRN for ostomy teaching and care at d/c. Pt given list last week by unit CM and he picked [facility].  Face sheet is correct , patient request [facility] to call his wife's cell number which is [phone], before calling his cell which is [phone]  ( listed as home number).

## 2012-03-10 NOTE — Telephone Encounter (Signed)
Pt is needing refill on his clonazepam -[facility]

## 2012-03-10 NOTE — Progress Notes (Signed)
Subjective: Pt ok. CT yesterday Feels good.   Objective: Physical Exam: BP 116/66  Pulse 85  Temp 98.5 F (36.9 C) (Oral)  Resp 16  Ht 5\' 10"  (1.778 m)  Wt 178 lb 12.7 oz (81.1 kg)  BMI 25.65 kg/m2  SpO2 97% Drain intact LLQ, scant output, site clean Drain removed without difficulty, dressing applied.  Labs: CBC No results found for this basename: WBC:2,HGB:2,HCT:2,PLT:2 in the last 72 hours BMET No results found for this basename: NA:2,K:2,CL:2,CO2:2,GLUCOSE:2,BUN:2,CREATININE:2,CALCIUM:2 in the last 72 hours LFT No results found for this basename: PROT,ALBUMIN,AST,ALT,ALKPHOS,BILITOT,BILIDIR,IBILI,LIPASE in the last 72 hours PT/INR No results found for this basename: LABPROT:2,INR:2 in the last 72 hours   Studies/Results: Ct Abdomen Pelvis W Contrast  03/09/2012  *RADIOLOGY REPORT*  Clinical Data: Followup abdominal drain placement 03/03/2012. Patient feeling better better.  No more drainage. Surgery for diverticular rupture.  CT ABDOMEN AND PELVIS WITH CONTRAST  Technique:  Multidetector CT imaging of the abdomen and pelvis was performed following the standard protocol during bolus administration of intravenous contrast.  Contrast: OMNIPAQUE IOHEXOL 300 MG/ML  SOLN  Comparison: 03/03/2012 and 03/02/2012 CT.  Findings: Post left-sided/sigmoid colectomy with colostomy left upper quadrant.  Drain has been placed in the left lower quadrant/pelvic collection. Interval significant improvement with decrease in size although incomplete resolution of the abscess/fluid collection within the left pelvis. Residual abscess/fluid is prominently located anterior and medial to the surgical drain with maximal transverse dimension of 4.4 x 1.4 cm and 3.3 x 2.3 cm.  Residual left-sided pleural effusion and basilar atelectasis.  Persistent posterior-superior perisplenic loculated collection measures 10.8 x 6.9 x 2.3 cm.  Infection not excluded.  Relatively similar to the prior exam.  Superior  lateral to the splenic flexure, 3 x 2.4 x 1.8 cm collection consistent with small abscess without significant change.  Fatty infiltration of the liver without focal liver mass.  Dilated gallbladder without calcified gallstones.  No pancreatic, adrenal or renal mass.  Large anterior abdominal incision healing by secondary intent.  Atherosclerotic type changes of the aorta and iliac arteries advance for patient's age.  Portions of small bowel prominent in size without point of obstruction identified.  No free intraperitoneal air.  Bilateral L5 pars defects.  IMPRESSION: Interval improvement although incomplete clearance of postoperative abscesses as noted above.   Original Report Authenticated By: Fuller Canada, M.D.     Assessment/Plan: LLQ abscess s/p perc drain Drain removed today, no issues.    LOS: 12 days    Brayton El PA-C 03/10/2012 9:52 AM

## 2012-03-11 NOTE — Discharge Summary (Signed)
Physician Discharge Summary  Patient ID: Timothy [name] MRN: [id number] DOB/AGE: 1965/11/08 46 y.o.  Admit date: 02/27/2012 Discharge date: 03/11/2012  Admission Diagnoses:  Discharge Diagnoses:  Active Problems:  * No active hospital problems. *    Discharged Condition: good  Hospital Course: the pt was readmitted with a leak from his anastamosis. He went to or and had colectomy with colostomy. He was treated with abx. He had to have a perc drain placed several days later. He did have some dehiscence of his fascia but no evisceration. He gradually improved and was able to get his perc drain out. He was finally discharged home with home health nursing and dressing changes and ostomy care.  Consults: None  Significant Diagnostic Studies: CT  Treatments: surgery: colectomy and colostomy. Perc drain  Discharge Exam: Blood pressure 116/66, pulse 85, temperature 98.5 F (36.9 C), temperature source Oral, resp. rate 16, height 5\' 10"  (1.778 m), weight 178 lb 12.7 oz (81.1 kg), SpO2 97.00%. GI: open wound was relatively clean. ostomy was pink and productive  Disposition: 01-Home or Self Care  Discharge Orders    Future Appointments: Provider: Department: Dept Phone: Center:   03/12/2012 11:10 AM Robyne Askew, MD [facility] [place] [phone] None   03/20/2012 9:15 AM Garnetta Buddy, MD [facility] [phone] [facility]     Future Orders Please Complete By Expires   Diet - low sodium heart healthy      Increase activity slowly      Discharge instructions      Comments:   Shower daily. Home health to do daily wet to dry dressing changes with kerlix. Ostomy care NO HEAVY LIFTING   Call MD for:  temperature >100.4      Call MD for:  persistant nausea and vomiting      Call MD for:  severe uncontrolled pain      Call MD for:  redness, tenderness, or signs of infection (pain, swelling, redness, odor or green/yellow discharge around incision site)      Call MD for:  difficulty  breathing, headache or visual disturbances      Call MD for:  hives      Call MD for:  persistant dizziness or light-headedness      Call MD for:  extreme fatigue        Medication List  As of 03/11/2012  4:03 PM   TAKE these medications         bisacodyl 5 MG EC tablet   Commonly known as: DULCOLAX   Take 5 mg by mouth 2 (two) times daily.      hydrocortisone 2.5 % rectal cream   Commonly known as: ANUSOL-HC   Place 1 application rectally 2 (two) times daily.      ibuprofen 800 MG tablet   Commonly known as: ADVIL,MOTRIN   Take 800 mg by mouth every 8 (eight) hours as needed. For pain      oxyCODONE-acetaminophen 5-325 MG per tablet   Commonly known as: PERCOCET/ROXICET   Take 1-2 tablets by mouth every 4 (four) hours as needed.           Follow-up Information    Follow up with TOTH III,Nickolette Espinola S, MD in 2 weeks.   Contact information:   [address] [place] [address] [phone]          Signed: Robyne Askew 03/11/2012, 4:03 PM

## 2012-03-12 ENCOUNTER — Encounter (INDEPENDENT_AMBULATORY_CARE_PROVIDER_SITE_OTHER): Payer: PRIVATE HEALTH INSURANCE | Admitting: General Surgery

## 2012-03-13 ENCOUNTER — Telehealth (INDEPENDENT_AMBULATORY_CARE_PROVIDER_SITE_OTHER): Payer: Self-pay | Admitting: General Surgery

## 2012-03-13 NOTE — Telephone Encounter (Signed)
Pt's wife calling with concern that Home Health had made initial assessment visit and case manager visit, but have not come back.  Neither she nor her husband are comfortable or confident in caring for the surgical wound and ostomy.  She has questions and concerns.  She called the [facility] office and expressed the need for someone to come out again, today if possible.  Wife instructed to write down her questions, leaving room to write in the answers from the visiting nurse.  They should get visits twice a week until they all feel the wife/ pt can handle the care alone.  Wife seemed reassured with the guidance.

## 2012-03-15 ENCOUNTER — Emergency Department (HOSPITAL_COMMUNITY)
Admission: EM | Admit: 2012-03-15 | Discharge: 2012-03-15 | Disposition: A | Discharge status: Home / Self Care | Payer: Medicaid Other | Attending: General Surgery | Admitting: General Surgery

## 2012-03-15 ENCOUNTER — Telehealth (INDEPENDENT_AMBULATORY_CARE_PROVIDER_SITE_OTHER): Payer: Self-pay | Admitting: General Surgery

## 2012-03-15 ENCOUNTER — Emergency Department (HOSPITAL_COMMUNITY)
Admission: EM | Admit: 2012-03-15 | Discharge: 2012-03-15 | Disposition: A | Discharge status: Home / Self Care | Payer: Medicaid Other | Source: Home / Self Care

## 2012-03-15 ENCOUNTER — Encounter (HOSPITAL_COMMUNITY): Payer: Self-pay | Admitting: Emergency Medicine

## 2012-03-15 DIAGNOSIS — W269XXA Contact with unspecified sharp object(s), initial encounter: Secondary | ICD-10-CM | POA: Insufficient documentation

## 2012-03-15 DIAGNOSIS — Z4801 Encounter for change or removal of surgical wound dressing: Secondary | ICD-10-CM | POA: Insufficient documentation

## 2012-03-15 DIAGNOSIS — S31109A Unspecified open wound of abdominal wall, unspecified quadrant without penetration into peritoneal cavity, initial encounter: Secondary | ICD-10-CM | POA: Insufficient documentation

## 2012-03-15 NOTE — ED Notes (Addendum)
Dr Biagio Quint in to see pt, changed dressing and instructed pt on follow up.

## 2012-03-15 NOTE — ED Notes (Signed)
Pt c/o increased redness and some bleeding from open abd wound; pt told to come here by surgery

## 2012-03-15 NOTE — Telephone Encounter (Signed)
I evaluated this patient in the ER triage room.  BP 135/100 HR 106 sats 98% R=18 T=97.5 His wound looks good with healthy granulation tissue. No evidence of bleeding.  Dressing changed and wound repacked.  He has a small sliver of fibrinous exudate in corner but otherwise looks excellent.  He must have had a small capillary bleed but nothing now.  He will continue current wound care and f/u with DR. Carolynne Edouard

## 2012-03-15 NOTE — Telephone Encounter (Signed)
His wife called concerned because he had some bleeding from his wound.  She says that it normally has some bleeding with the dressing changes but this seemed more than usual.  He has an open wound and is doing wet to dry dressing changes.  I recommended that she bring him in to the ER for evaluation of the wound and ensure that no intervention is needed for treatment.

## 2012-03-16 ENCOUNTER — Telehealth (INDEPENDENT_AMBULATORY_CARE_PROVIDER_SITE_OTHER): Payer: Self-pay | Admitting: General Surgery

## 2012-03-16 NOTE — Telephone Encounter (Signed)
Patient wife called in statin that the patient wanted more pain medication. Advised a page would be sent to Dr. Carolynne Edouard to determine if it will be allowed. Patient had sx 02/27/12 and a script is in the system for percocet issued 03/10/12 with an end date of 03/20/12. She agreed.

## 2012-03-16 NOTE — Telephone Encounter (Signed)
Called back spouse on cell to advise Dr. Carolynne Edouard approved the refill of percocet. Advised that the script has to be signed and will have to be picked up here at the office. Advised to bring identification. She agreed.

## 2012-03-20 ENCOUNTER — Ambulatory Visit (INDEPENDENT_AMBULATORY_CARE_PROVIDER_SITE_OTHER): Payer: Self-pay | Admitting: Family Medicine

## 2012-03-20 ENCOUNTER — Encounter: Payer: Self-pay | Admitting: Family Medicine

## 2012-03-20 VITALS — BP 132/89 | HR 97 | Ht 70.0 in | Wt 154.0 lb

## 2012-03-20 DIAGNOSIS — Z933 Colostomy status: Secondary | ICD-10-CM

## 2012-03-20 DIAGNOSIS — F411 Generalized anxiety disorder: Secondary | ICD-10-CM

## 2012-03-21 DIAGNOSIS — IMO0002 Reserved for concepts with insufficient information to code with codable children: Secondary | ICD-10-CM | POA: Insufficient documentation

## 2012-03-21 DIAGNOSIS — F410 Panic disorder [episodic paroxysmal anxiety] without agoraphobia: Secondary | ICD-10-CM | POA: Insufficient documentation

## 2012-03-21 HISTORY — DX: Reserved for concepts with insufficient information to code with codable children: IMO0002

## 2012-03-21 NOTE — Progress Notes (Signed)
  Subjective:    Patient ID: Timothy [name], male    DOB: 06/24/1966, 46 y.o.   MRN: [id number]  HPI 46 year old M with recurrent diverticulitis who underwent an elective laparoscopic sigmoid colectomy by Dr. Carolynne Edouard on 7/31 followed by Exploratory laparotomy with sigmoid colon resection (at anastomosis) and end left colon colostomy(LUQ) and Hartmann pouch for a pneumoperitoneum on 02/27/12. He was discharged from the hospital on 8/21. He reports no problems since discharge. His wound is healing well, and his wife is helping him change his ostomy bag. He was experiencing significant anxiety and stress due to his health and social stressors. Therefore, he was started on Lexapro 10 mg daily. Mr. Swiss and his wife [name] both note that his mood is much improved since being out of the hospital. He denies any alcohol or tobacco use since July.    Review of Systems  All other systems reviewed and are negative.       Objective:   Physical Exam  Constitutional: No distress.       Thin with temporal wasting   HENT:  Head: Normocephalic and atraumatic.  Abdominal:    Psychiatric: He has a normal mood and affect. His speech is normal and behavior is normal. Thought content normal.        Assessment & Plan:  46 year old M with a history of recent abdominal surgery for complications of sigmoid colectomy who is recovering well.

## 2012-03-21 NOTE — Assessment & Plan Note (Signed)
Continue Lexapro 10 mg daily

## 2012-03-21 NOTE — Assessment & Plan Note (Signed)
Appears normal. Defer management to surgical team.

## 2012-03-23 ENCOUNTER — Telehealth (INDEPENDENT_AMBULATORY_CARE_PROVIDER_SITE_OTHER): Payer: Self-pay | Admitting: Surgery

## 2012-03-23 NOTE — Telephone Encounter (Signed)
Called by the home health nurse.Patient is now a month status post colectomy complicated by delayed leak and need for colostomy with an open wound.  Wound has some green sheet into it in a little mild follow her.  Continuing daily dressing changes.  Colostomy is flat now.  Patient was struggling with constipation but after increasing MiraLAX is moving his bowels much better now.  Eating OK.  No severe nausea or vomiting.  Having some right-sided abdominal discomfort though.  Not wanting to take any pain medications.  Energy down a little bit.  Due to see [place] in the office on September 11.  I recommend that hisddressing care be changed to a 1/4 strength Dakin's solution for 48 hours To help sterilize probable pseudomonal bacterial colonization then return to saline dressing changes.  Home health nurse did not seem certain how to switch that.  I gave a verbal order to do that.  She will talk to her supervisor to help switch supplies.  I think he would benefit from CBC, CMET, CT scan of abdomen pelvis with oral and IV contrast to rule out an intra-abdominal abscess tomorrow  I believe he needs to be seen in our office in the next few days to make sure he continues to improve and not declined.  He sounded stable and did not need to go the emergency room today.  I noted if he feels worse or things are unbearable, then go to the emergency room wherer we can evaluate him more aggressively.  Sounds like they were comfortable with following up more closely in our office in a day or two.

## 2012-03-24 ENCOUNTER — Telehealth (INDEPENDENT_AMBULATORY_CARE_PROVIDER_SITE_OTHER): Payer: Self-pay | Admitting: General Surgery

## 2012-03-24 ENCOUNTER — Ambulatory Visit
Admission: RE | Admit: 2012-03-24 | Discharge: 2012-03-24 | Disposition: A | Discharge status: Home / Self Care | Payer: No Typology Code available for payment source | Source: Ambulatory Visit | Attending: General Surgery | Admitting: General Surgery

## 2012-03-24 ENCOUNTER — Other Ambulatory Visit (INDEPENDENT_AMBULATORY_CARE_PROVIDER_SITE_OTHER): Payer: Self-pay | Admitting: General Surgery

## 2012-03-24 DIAGNOSIS — K5792 Diverticulitis of intestine, part unspecified, without perforation or abscess without bleeding: Secondary | ICD-10-CM

## 2012-03-24 LAB — CBC
MCH: 27.9 pg (ref 26.0–34.0)
MCHC: 31.9 g/dL (ref 30.0–36.0)
Platelets: 848 10*3/uL — ABNORMAL HIGH (ref 150–400)
RDW: 14.7 % (ref 11.5–15.5)

## 2012-03-24 MED ORDER — IOHEXOL 300 MG/ML  SOLN
100.0000 mL | Freq: Once | INTRAMUSCULAR | Status: AC | PRN
  Administered 2012-03-24: 100 mL via INTRAVENOUS

## 2012-03-24 NOTE — Telephone Encounter (Signed)
Spoke with pt and informed him that I had called in the Rx for 1/4 strength Dakin's solution to the [facility] outpatient pharmacy.  I also informed him that per Dr. Carolynne Edouard he wants him to be seen in the urgent office so I put him on for 03/26/12 at 2:30.  He was fine with this.

## 2012-03-25 LAB — COMPREHENSIVE METABOLIC PANEL
AST: 17 U/L (ref 0–37)
Alkaline Phosphatase: 201 U/L — ABNORMAL HIGH (ref 39–117)
Glucose, Bld: 87 mg/dL (ref 70–99)
Sodium: 138 mEq/L (ref 135–145)
Total Bilirubin: 0.2 mg/dL — ABNORMAL LOW (ref 0.3–1.2)
Total Protein: 6.7 g/dL (ref 6.0–8.3)

## 2012-03-26 ENCOUNTER — Encounter (INDEPENDENT_AMBULATORY_CARE_PROVIDER_SITE_OTHER): Payer: PRIVATE HEALTH INSURANCE | Admitting: General Surgery

## 2012-03-26 ENCOUNTER — Telehealth (INDEPENDENT_AMBULATORY_CARE_PROVIDER_SITE_OTHER): Payer: Self-pay | Admitting: General Surgery

## 2012-03-26 NOTE — Telephone Encounter (Signed)
Calling to see how long to use Daiken solution. I advised we usually continue Daiken solution until green colored drainage ceases. Patient has an appt tomorrow and will discuss with MD at appt.

## 2012-03-27 ENCOUNTER — Encounter (INDEPENDENT_AMBULATORY_CARE_PROVIDER_SITE_OTHER): Payer: Self-pay | Admitting: General Surgery

## 2012-03-27 ENCOUNTER — Telehealth (INDEPENDENT_AMBULATORY_CARE_PROVIDER_SITE_OTHER): Payer: Self-pay | Admitting: General Surgery

## 2012-03-27 ENCOUNTER — Ambulatory Visit (INDEPENDENT_AMBULATORY_CARE_PROVIDER_SITE_OTHER): Payer: PRIVATE HEALTH INSURANCE | Admitting: General Surgery

## 2012-03-27 VITALS — BP 140/82 | HR 74 | Temp 98.6°F | Resp 18 | Ht 70.0 in | Wt 160.0 lb

## 2012-03-27 DIAGNOSIS — K5792 Diverticulitis of intestine, part unspecified, without perforation or abscess without bleeding: Secondary | ICD-10-CM

## 2012-03-27 DIAGNOSIS — K5732 Diverticulitis of large intestine without perforation or abscess without bleeding: Secondary | ICD-10-CM

## 2012-03-27 MED ORDER — DAKINS (1/2 STRENGTH) 0.25 % EX SOLN: Freq: Once | CUTANEOUS | Status: AC

## 2012-03-27 NOTE — Telephone Encounter (Signed)
LMOM asking pt to return my call.  This call is in regards to them using the daiken's solution for a week.

## 2012-03-27 NOTE — Progress Notes (Signed)
Subjective:     Patient ID: Timothy [name], male   DOB: 10/09/1965, 46 y.o.   MRN: [id number]  HPIpatient is a 46 year old male status post Hartmann's procedure secondary to perforated diverticulitis. The patient underwent CT scan abdomen and pelvis which revealed resolution of previously seen small left upper quadrant and left lower quadrant fluid collections, and significant decrease in size of splenic fluid collection.  Patient he continues to be good by mouth and continued his calorie intake. Patient wife stated continue to dress wound twice a day with [facility] solution.   Review of Systems  Constitutional: Negative.   HENT: Negative.   Eyes: Negative.   Respiratory: Negative.   Cardiovascular: Negative.   Gastrointestinal: Negative.   Neurological: Negative.        Objective:   Physical Exam  Constitutional: He is oriented to person, place, and time. He appears well-developed and well-nourished.  HENT:  Head: Normocephalic and atraumatic.  Eyes: Conjunctivae are normal. Pupils are equal, round, and reactive to light.  Neck: Normal range of motion. Neck supple.  Cardiovascular: Normal rate and regular rhythm.   Pulmonary/Chest: Effort normal and breath sounds normal.  Abdominal: Soft. He exhibits no distension. There is no tenderness. There is no rebound and no guarding.       Midline wound healing well. About 7x2x3cm.  Good granulation tissue.  Musculoskeletal: Normal range of motion.  Neurological: He is alert and oriented to person, place, and time.       Assessment:     46 year old male status post Hartmann's for perforated diverticulitis    Plan:     1. Continue dressings twice a day with Lisette Grinder solution will provide refilled with Dakin solution prescription  2. Increase caloric intake his albumin is minimally low at 3.2.  3. Followup with Dr. Carolynne Edouard x3 weeks.

## 2012-03-30 ENCOUNTER — Telehealth (INDEPENDENT_AMBULATORY_CARE_PROVIDER_SITE_OTHER): Payer: Self-pay | Admitting: General Surgery

## 2012-03-30 ENCOUNTER — Telehealth (INDEPENDENT_AMBULATORY_CARE_PROVIDER_SITE_OTHER): Payer: Self-pay

## 2012-03-30 NOTE — Telephone Encounter (Signed)
Spoke with [name] from [place] and explained that Dr. Carolynne Edouard does want them to continue [name] care.  She said this was fine and that she would continue to do so.

## 2012-03-30 NOTE — Telephone Encounter (Signed)
Wife calling for pt with continuing Lt shoulder pain, described "like a crick."  He reports he is afebrile and infection was ruled out by MD.  Does not want to take narcotic pain meds so he can work.  Suggested Aleve (2 tabs Q12H) or Ibuprofen 600 mg Q6H for anti-inflammatory pain med and use heating pad when possible.  Could also try moist heat (warmed washcloth) or gentle massage to site of pain.  Wife will try these ideas and call back.

## 2012-03-30 NOTE — Telephone Encounter (Signed)
[name] the nurse with [facility] called.  She wants to know if you want her to continue care.  Please call with order renewal.  She was providing colostomy care and wound care.

## 2012-04-01 ENCOUNTER — Encounter (INDEPENDENT_AMBULATORY_CARE_PROVIDER_SITE_OTHER): Payer: PRIVATE HEALTH INSURANCE | Admitting: General Surgery

## 2012-04-01 ENCOUNTER — Telehealth: Payer: Self-pay | Admitting: Family Medicine

## 2012-04-01 NOTE — Telephone Encounter (Signed)
Patient called to let me know that his wife and daughter have an upper respiratory infection. He was worried about catching it. I told him to avoid contact and saliva, stay hydrated, and use a cough suppressant to avoid putting stress on his healing abdominal incision. He voiced understanding.

## 2012-04-03 ENCOUNTER — Telehealth (INDEPENDENT_AMBULATORY_CARE_PROVIDER_SITE_OTHER): Payer: Self-pay | Admitting: General Surgery

## 2012-04-03 NOTE — Telephone Encounter (Signed)
Return pt call and informed her that I spoke with Dr. Carolynne Edouard about this issue and he said that this is okay.  [name] informed me that other than this issue her husband has been doing very well.  Instructed the pt to keep his appt on 9/26 and to call if they had any other questions or concerns.

## 2012-04-03 NOTE — Telephone Encounter (Signed)
PT'S WIFE CALLED TO REPORT THAT Jaelynn WENT INTO BATHROOM AND HAD WHAT HE SAID WAS A BOWEL MOVEMENT THROUGH HIS RECTUM/ IT WAS BROWN IN COLOR WITH ODOR/ PLEASE CALL/ [phone]/ GY

## 2012-04-08 ENCOUNTER — Telehealth (INDEPENDENT_AMBULATORY_CARE_PROVIDER_SITE_OTHER): Payer: Self-pay

## 2012-04-08 NOTE — Telephone Encounter (Signed)
Pt would like a refill of his pain medication.  Vicodin 5/325 #30 w/ no refills called to [facility] outpatient pharmacy.

## 2012-04-13 ENCOUNTER — Telehealth (INDEPENDENT_AMBULATORY_CARE_PROVIDER_SITE_OTHER): Payer: Self-pay | Admitting: General Surgery

## 2012-04-13 NOTE — Telephone Encounter (Signed)
Patient's wife calling stating patient is having cramping abdominal pain that seems to be getting worse by the hour. His stoma seems "sunk in." He is having a lot of nausea and had one episode of vomiting on Friday but none since. She is very concerned and wants to speak with Dr Carolynne Edouard if possible. Please advise.

## 2012-04-14 ENCOUNTER — Encounter (INDEPENDENT_AMBULATORY_CARE_PROVIDER_SITE_OTHER): Payer: Self-pay | Admitting: General Surgery

## 2012-04-14 ENCOUNTER — Ambulatory Visit (INDEPENDENT_AMBULATORY_CARE_PROVIDER_SITE_OTHER): Payer: PRIVATE HEALTH INSURANCE | Admitting: General Surgery

## 2012-04-14 VITALS — BP 130/86 | HR 76 | Temp 97.8°F | Resp 16 | Ht 70.0 in | Wt 166.2 lb

## 2012-04-14 DIAGNOSIS — K5732 Diverticulitis of large intestine without perforation or abscess without bleeding: Secondary | ICD-10-CM

## 2012-04-14 DIAGNOSIS — K5792 Diverticulitis of intestine, part unspecified, without perforation or abscess without bleeding: Secondary | ICD-10-CM

## 2012-04-14 NOTE — Progress Notes (Signed)
Subjective:     Patient ID: Timothy [name], male   DOB: April 06, 1966, 46 y.o.   MRN: [id number]  HPI The patient is a 46 year old white male who is about 6 weeks status post colectomy and colostomy for a anastomotic leak. He had some cramping abdominal pain recently. This was accompanied by some constipation. He took a double dose of MiraLAX and the constipation resolved. His cramping abdominal pain is gradually improving. He denies any fevers.  Review of Systems     Objective:   Physical Exam On exam his abdomen is soft and nontender. His midline incision is healing very nicely with good granulation tissue. His ostomy is pink and productive.    Assessment:     6 weeks status post colectomy and colostomy for an anastomotic leak    Plan:     At this point we will continue to do daily dressing changes. With his open wound and ventral hernia I do not think he is able to return to work anytime soon. We will plan to see him back in one month for a wound check.

## 2012-04-14 NOTE — Patient Instructions (Signed)
Continue daily dressing changes

## 2012-04-16 ENCOUNTER — Encounter (INDEPENDENT_AMBULATORY_CARE_PROVIDER_SITE_OTHER): Payer: PRIVATE HEALTH INSURANCE | Admitting: General Surgery

## 2012-04-22 ENCOUNTER — Telehealth (INDEPENDENT_AMBULATORY_CARE_PROVIDER_SITE_OTHER): Payer: Self-pay

## 2012-04-22 ENCOUNTER — Telehealth: Payer: Self-pay | Admitting: Family Medicine

## 2012-04-22 NOTE — Telephone Encounter (Signed)
Called pt. Pt reports, that he had surgery 7 weeks ago. He has trouble sleeping through the night.  We have never evaluated pt for this problem. He never had any medications for sleep. I advised him to be evaluated by his PCP to figure out the best treatment for him. Pt agreed and will schedule appt. Lorenda Hatchet, Renato Battles

## 2012-04-22 NOTE — Telephone Encounter (Signed)
Patient is calling because he is not sleeping at all at night.  This has been going on for a few weeks.  He uses [facility].

## 2012-04-22 NOTE — Telephone Encounter (Signed)
i am ok with him having these supplies

## 2012-04-22 NOTE — Telephone Encounter (Signed)
Pt needs Rx for ostomy supplies faxed to [phone].  [employer] bags [id number]; wafers [id number]; skin prep [id number]; lubricating deodorant # [id number]; barrier rings [id number], and stoma powder #[id number].

## 2012-04-23 ENCOUNTER — Telehealth (INDEPENDENT_AMBULATORY_CARE_PROVIDER_SITE_OTHER): Payer: Self-pay | Admitting: General Surgery

## 2012-04-23 ENCOUNTER — Telehealth (INDEPENDENT_AMBULATORY_CARE_PROVIDER_SITE_OTHER): Payer: Self-pay

## 2012-04-23 NOTE — Telephone Encounter (Signed)
Spoke with pt and informed them that I will mail the Rx for his ostomy supplies.  His wife said this was fine but she also wanted to inform me that his home health will stop as of Tuesday and that he husband has gone back to smoking.

## 2012-04-23 NOTE — Telephone Encounter (Signed)
Pt's wife called re:rx for ostomy supplies. She has not received fax. I advised her that I will fill out rx and send it to Dr Carolynne Edouard to sign and fax.

## 2012-04-27 ENCOUNTER — Ambulatory Visit (INDEPENDENT_AMBULATORY_CARE_PROVIDER_SITE_OTHER): Payer: Self-pay | Admitting: Family Medicine

## 2012-04-27 ENCOUNTER — Encounter: Payer: Self-pay | Admitting: Family Medicine

## 2012-04-27 VITALS — BP 148/64 | HR 103 | Ht 70.0 in | Wt 173.0 lb

## 2012-04-27 DIAGNOSIS — G4700 Insomnia, unspecified: Secondary | ICD-10-CM

## 2012-04-27 DIAGNOSIS — F410 Panic disorder [episodic paroxysmal anxiety] without agoraphobia: Secondary | ICD-10-CM

## 2012-04-27 MED ORDER — DIAZEPAM 5 MG PO TABS: 5.0000 mg | ORAL_TABLET | Freq: Every day | ORAL | Status: DC | PRN

## 2012-04-27 MED ORDER — ESCITALOPRAM OXALATE 10 MG PO TABS: 20.0000 mg | ORAL_TABLET | Freq: Every day | ORAL | Status: DC

## 2012-04-27 MED ORDER — ZOLPIDEM TARTRATE 10 MG PO TABS: 10.0000 mg | ORAL_TABLET | Freq: Every evening | ORAL | Status: DC | PRN

## 2012-04-27 NOTE — Assessment & Plan Note (Signed)
This is clearly refractory to Lexapro 10 mg and a direct results of his health and financial situation, which are intimately linked. I will increase the Lexapro to 20 mg daily. Additionally, I will give a prescription for Diazepam, 5 mg daily PRN. We discussed at length the risks and benefits of this medication. He is aware that this is not to be shared with anyone else and not to be taken more than once a day. It is a short term medication while we stabilize his anxiety with an SSRI.

## 2012-04-27 NOTE — Progress Notes (Signed)
  Subjective:    Patient ID: Timothy [name], male    DOB: 1965/11/29, 46 y.o.   MRN: [id number]  HPI  46 year old male with diverticular disease who had a colectomy on February 20, 2012 performed by P. Carolynne Edouard, MD. He then presented with pneumoperitoneum and leaking from his anastomosis on 02/27/12, and he underwent an exploratory laparotomy with sigmoid colectomy and left colon colostomy and Hartmann's pouch performed by Dr. Algis Downs. [name] and Dr. Hortense Ramal. Since that time the patient has had improvement in his appetite and strength, but he has experienced an increasing amount of stress. He was started on Lexapro 10 mg for anxiety at discharge from the hospital. He presents today for evaluation of worsening anxiety with panic attacks and sleeping difficulty. He presents today with his wife Timothy [name].   Anxiety w/ Panic Attacks - Current stressors include health status with colostomy, not being able to work, and financial strain. He has started smoking again after 2 months of quitting. He relates this to stress. He denies drinking, but mentions that he has considered it several times in the last few weeks. His wife if very worried that he will go back to drinking. On multiple occassions when the patient is having a panic attack, his wife has given him a benzodiazepine from her prescription.   Sleep Problems - Started 2-3 weeks ago, related to numerous stressors; sleeps 3 hours a night 2/2 difficulty falling asleep; originally thought is was due to napping in the afternoon but is no longer napping; refractory to multiple OTC meds including advil PM, nyquil, and anti-histamines, no history of sleep disorder   Review of Systems Positive for tobacco use     Objective:   Physical Exam  BP 148/64  Pulse 103  Ht 5\' 10"  (1.778 m)  Wt 173 lb (78.472 kg)  BMI 24.82 kg/m2 Gen: gained weight since last visit, appears fatigued and very restless CV: mild sinus tachycardia Abd: well healing 10 cm x 2 x 2 cm abdominal  incision with good granulation tissue; LUQ ostomy without problems  Psych: distressed appearing, normal speech, behavior, and thought content      Assessment & Plan:  46 year old M with worsening anxiety with panic attacks secondary to stressors from recent abdominal surgery.

## 2012-04-27 NOTE — Assessment & Plan Note (Signed)
This is secondary to stressful situation. His sleep hygiene appears proper right now. We will start Ambien for a two week trial course. The patient was explained the risks and benefits of this medication. Follow up in 2 weeks.

## 2012-05-06 ENCOUNTER — Telehealth (INDEPENDENT_AMBULATORY_CARE_PROVIDER_SITE_OTHER): Payer: Self-pay

## 2012-05-06 ENCOUNTER — Telehealth (INDEPENDENT_AMBULATORY_CARE_PROVIDER_SITE_OTHER): Payer: Self-pay | Admitting: General Surgery

## 2012-05-06 NOTE — Telephone Encounter (Signed)
Patient would like to know if he it's okay to mow his yard with a riding lawnmower.  Patient said his yard is very bumpy.  Please call and advise patient.

## 2012-05-06 NOTE — Telephone Encounter (Signed)
Called Mr. Castille, advised that per Dr. Carolynne Edouard he should not ride his riding lawnmower if yard is bumpy.

## 2012-05-06 NOTE — Telephone Encounter (Signed)
Informed patient that Dr. Carolynne Edouard said he should not ride a lawn mower.

## 2012-05-15 ENCOUNTER — Ambulatory Visit (INDEPENDENT_AMBULATORY_CARE_PROVIDER_SITE_OTHER): Payer: Medicaid Other | Admitting: Family Medicine

## 2012-05-15 ENCOUNTER — Encounter: Payer: Self-pay | Admitting: Family Medicine

## 2012-05-15 VITALS — BP 138/80 | HR 78 | Temp 98.1°F | Ht 70.0 in | Wt 182.0 lb

## 2012-05-15 DIAGNOSIS — F410 Panic disorder [episodic paroxysmal anxiety] without agoraphobia: Secondary | ICD-10-CM

## 2012-05-15 DIAGNOSIS — F17200 Nicotine dependence, unspecified, uncomplicated: Secondary | ICD-10-CM

## 2012-05-15 DIAGNOSIS — G4700 Insomnia, unspecified: Secondary | ICD-10-CM

## 2012-05-15 DIAGNOSIS — Z720 Tobacco use: Secondary | ICD-10-CM

## 2012-05-15 MED ORDER — ZOLPIDEM TARTRATE 10 MG PO TABS: 10.0000 mg | ORAL_TABLET | Freq: Every evening | ORAL | Status: DC | PRN

## 2012-05-15 MED ORDER — ESCITALOPRAM OXALATE 10 MG PO TABS: 20.0000 mg | ORAL_TABLET | Freq: Every day | ORAL | Status: DC

## 2012-05-15 NOTE — Patient Instructions (Addendum)
Dear [name],   Thank you for coming to clinic today. Please read below regarding the issues that we discussed.   1. Insomnia - Continue the Ambien 10 mg as needed.   2. Anxiety - I will refill the Diazepam on 05/28/12. I will also continue the Lexapro at 20 mg daily. I recommend that you meet with our psychologist, Dr. Spero Geralds, for help dealing with your stress of this sitiation.  You can schedule an appointment with her by calling her directly at [phone].  3. Cigarettes - This is going to be a difficult change. Try to electronic cigarettes as needed. If you would like Dr. Raymondo Band, our pharmacist, is a expert in setting strategy. Also, 1800 Quit Line is possibility.   Please follow up in clinic in 2 weeks. Please call earlier if you have any questions or concerns.   Sincerely,   Dr. [name]

## 2012-05-15 NOTE — Assessment & Plan Note (Signed)
Patient agreeable to quit smoking. Did not want Wellbutrin as previously unsuccessful. Will try electronic cigarette as this was successful in the past.

## 2012-05-15 NOTE — Assessment & Plan Note (Signed)
Successful treatment with ambien and poor sleep after discontinuing med. Continue PRN.

## 2012-05-15 NOTE — Progress Notes (Signed)
  Subjective:    Patient ID: Timothy [name], male    DOB: 01-05-1966, 46 y.o.   MRN: [id number]  HPI  46 year old M with recent colectomy for diverticulitis who has developed anxiety, depression and insomnia after the numerous complications and lifestyle changes of the procedure. At his previous visit his Lexapro was increased to 20 mg daily, he was given diazepam 5 mg daily PRN for panic attacks, and ambien 10 mg PRN for sleep. He presents for follow up.   Insomnia - Tried 7 straight days of Ambien. It was successful for his sleep. He tried 3 days without the medications, and he had difficulty with sleeping during the nights. He would fall asleep during those times and wake up repeatedly throughout the night. Therefore, he resumed taking the Ambien, and his sleep has improved. His only side effect is strange dreams.   Anxiety - Has started that he has increased the Lexapro to 20 mg daily. He has minimal change in the Lexapro, but states that his wife believe he is improving. Is take Diazepam PRN for anxiety attacks. Not daily. Has never talked to a counselor but is willing to do so.   Tobacco Abuse - Still smoking about 1.5 ppd, recently quit after first surgery but restarted within last month, would like to quit. Has used the electronic cigarette successfully. In the past, he has unsuccessfully tried Welbutrin.   Hx of Alcohol Abuse - Not drinking currently   Review of Systems Positive  Negative     Objective:   Physical Exam BP 138/80  Pulse 78  Temp 98.1 F (36.7 C) (Oral)  Ht 5\' 10"  (1.778 m)  Wt 182 lb (82.555 kg)  BMI 26.11 kg/m2 Gen: middle aged WM, improved weight and coloring since last visit, non distressed Abd: left sided colostomy in place, midline well healing incision approximately 4 cm long with good granulation tissue Psych: normal affect, speech, and thought content; markedly less stressed and fidgety than previous visit      Assessment & Plan:  46 year old M with  anxiety, insomnia, and tobacco abuse.

## 2012-05-15 NOTE — Assessment & Plan Note (Signed)
Continue Lexapro 20 mg daily. No refill of Alprazolam until on or after 04/26/12. He will return.

## 2012-05-18 ENCOUNTER — Telehealth: Payer: Self-pay | Admitting: Family Medicine

## 2012-05-18 ENCOUNTER — Encounter (INDEPENDENT_AMBULATORY_CARE_PROVIDER_SITE_OTHER): Payer: Self-pay | Admitting: General Surgery

## 2012-05-18 ENCOUNTER — Ambulatory Visit (INDEPENDENT_AMBULATORY_CARE_PROVIDER_SITE_OTHER): Payer: Self-pay | Admitting: General Surgery

## 2012-05-18 VITALS — BP 162/60 | HR 88 | Temp 98.0°F | Resp 16 | Ht 70.0 in | Wt 183.4 lb

## 2012-05-18 DIAGNOSIS — K5732 Diverticulitis of large intestine without perforation or abscess without bleeding: Secondary | ICD-10-CM

## 2012-05-18 DIAGNOSIS — K5792 Diverticulitis of intestine, part unspecified, without perforation or abscess without bleeding: Secondary | ICD-10-CM

## 2012-05-18 NOTE — Patient Instructions (Signed)
Continue dressing changes.  ?

## 2012-05-18 NOTE — Telephone Encounter (Signed)
Called pt and advised not to take Ambien 1 1/2 tablet. He has been taking Ambien 10 mg every night for the last 2 weeks and wants to increase it, because he wakes up at 2 am and can't go back to sleep until 4 am. Again, advised NOT to increase Ambien. While talking with the pt, he also reports, that when he takes a klonopin 0.5 mg with the [place], he is able to sleep. Does not take klonopin often, only prn. I told the pt that I would give this info to Dr.Williamson and call him back. He agreed. Lorenda Hatchet, Renato Battles

## 2012-05-18 NOTE — Progress Notes (Signed)
Subjective:     Patient ID: Timothy [name], male   DOB: 1965-10-21, 46 y.o.   MRN: [id number]  HPI The patient is a 46 year old white male who is about 2 and one half months status post sigmoid colectomy with a postoperative leak and subsequent partial colectomy with colostomy. He is doing very well now. He complains only of some minor shortness along the right lateral edge of the old incision. He is been able to put some weight back on. His appetite has been good. His ostomy is working well formed.  Review of Systems     Objective:   Physical Exam On exam his abdomen is soft and nontender. His midline incision is almost completely healed. The ostomy is pink and productive.    Assessment:     2-1/2 months status post colectomy with colostomy    Plan:     At this point he continued to do dressings to the midline wound until it is completely healed. We will plan to see him back in one months to check his progress. At that time I believe that we'll be ready to start planning for his colostomy takedown.

## 2012-05-18 NOTE — Telephone Encounter (Signed)
Called pt.

## 2012-05-18 NOTE — Telephone Encounter (Signed)
Pt is still not sleeping thru the night and wants to know if he can take 1 1/2 or double the Ambien

## 2012-05-19 NOTE — Telephone Encounter (Signed)
I spoke with Timothy [name] and told him that taking 2 Remus Loffler is not recommended and could be dangerous. It is safe to take the diazepam 5 mg as needed for sleep. We will follow up in one week.

## 2012-05-26 ENCOUNTER — Telehealth (INDEPENDENT_AMBULATORY_CARE_PROVIDER_SITE_OTHER): Payer: Self-pay | Admitting: General Surgery

## 2012-05-26 NOTE — Telephone Encounter (Signed)
Wife called to ask if husband reported to Dr. Carolynne Edouard the "knot" (slightly less than golf ball size) at the lower part of his incision.  Reviewed office note and mention was made of it.

## 2012-05-28 ENCOUNTER — Ambulatory Visit (INDEPENDENT_AMBULATORY_CARE_PROVIDER_SITE_OTHER): Payer: Medicaid Other | Admitting: Family Medicine

## 2012-05-28 ENCOUNTER — Encounter: Payer: Self-pay | Admitting: Family Medicine

## 2012-05-28 VITALS — BP 136/83 | HR 80 | Ht 70.0 in | Wt 184.0 lb

## 2012-05-28 DIAGNOSIS — G4700 Insomnia, unspecified: Secondary | ICD-10-CM

## 2012-05-28 DIAGNOSIS — F329 Major depressive disorder, single episode, unspecified: Secondary | ICD-10-CM

## 2012-05-28 DIAGNOSIS — F410 Panic disorder [episodic paroxysmal anxiety] without agoraphobia: Secondary | ICD-10-CM

## 2012-05-28 MED ORDER — DIAZEPAM 5 MG PO TABS: 5.0000 mg | ORAL_TABLET | Freq: Every day | ORAL | Status: DC | PRN

## 2012-05-28 NOTE — Progress Notes (Signed)
  Subjective:    Patient ID: Timothy [name], male    DOB: Feb 19, 1966, 46 y.o.   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   Anxiety - Currently taking escitalopram and diazepam PRN. He is taking the escitalopram daily and the diazepam PRN. He is taking the diazepam for periods of difficulty sleeping, when it is not controlled by his [place]. He is at times taking up to 2 tablets of diazepam for severe anxiety. He does not have a strategy for coping with his anxiety. His wife is worried that he acts "drunk" when he takes diazepam. He has poor balance at time, but he is not driving after taking the medication. He has abstained from alcohol since July.   Insomnia - He was prescribed ambien 10 mg approximately one month ago for difficulty falling asleep and staying asleep. It has been helpful, and he takes it most night. However, his wife has noticed a distinct change in his personality since starting the Ambien. She describes him as being rude and temperamental at all times, which is a stark contrast from his previous mood. He agrees with his wife.     Review of Systems Positive for tobacco use Negative for alcohol use    Objective:   Physical Exam BP 136/83  Pulse 80  Ht 5\' 10"  (1.778 m)  Wt 184 lb (83.462 kg)  BMI 26.40 kg/m2 Psych: normal affect and conversation, acknowledges more mood lability GAD7 - 21/21 PHQ9 - 24/27     Assessment & Plan:  46 year old M with poorly controlled anxiety, depression, and insomnia which are related to health stressors in his life. 30 minutes were spent face-to-face counseling the patient and his wife.

## 2012-06-01 ENCOUNTER — Encounter (INDEPENDENT_AMBULATORY_CARE_PROVIDER_SITE_OTHER): Payer: Self-pay | Admitting: General Surgery

## 2012-06-01 ENCOUNTER — Ambulatory Visit (INDEPENDENT_AMBULATORY_CARE_PROVIDER_SITE_OTHER): Payer: PRIVATE HEALTH INSURANCE | Admitting: General Surgery

## 2012-06-01 VITALS — BP 142/86 | HR 74 | Temp 98.6°F | Resp 18 | Ht 70.0 in | Wt 178.4 lb

## 2012-06-01 DIAGNOSIS — K5732 Diverticulitis of large intestine without perforation or abscess without bleeding: Secondary | ICD-10-CM

## 2012-06-01 DIAGNOSIS — K5792 Diverticulitis of intestine, part unspecified, without perforation or abscess without bleeding: Secondary | ICD-10-CM

## 2012-06-01 NOTE — Progress Notes (Signed)
Subjective:     Patient ID: Timothy [name], male   DOB: [birth year], 46 y.o.   MRN: [id number]  HPI The patient is a 46 year old white male who is 3 months status post colectomy for diverticulitis. His course was complicated by a leak requiring colostomy as well as a superficial wound infection with some fascial dehiscence. His ostomy has been working well. The last few days he had some pain and some drainage from the inferior portion of his healing incision. He denies any fevers or chills.  Review of Systems     Objective:   Physical Exam On exam the midline wound continues to heal. There is a small open area near the inferior edge of the scar where he had some drainage but this opening does not track anywhere. There is no cellulitis. His ostomy is pink and productive.    Assessment:     3 months status post sigmoid colectomy and colostomy    Plan:     At this point I think his wound seems to be healing reasonably well. I do not think he needs any antibiotics at this point. We will plan to see him back in about a month at which time we will begin his surgery planning for colostomy reversal

## 2012-06-01 NOTE — Assessment & Plan Note (Signed)
We will stop taking ambien given patient's negative side effects. He and his wife are agreeable. Diazepam 0.5 mg PRN will be used instead.

## 2012-06-02 DIAGNOSIS — F319 Bipolar disorder, unspecified: Secondary | ICD-10-CM | POA: Insufficient documentation

## 2012-06-02 NOTE — Assessment & Plan Note (Signed)
Directly related to anxiety and insomnia. See anxiety plan.

## 2012-06-02 NOTE — Assessment & Plan Note (Signed)
Continue Lexapro 20 mg daily but have a low threshold for changing that at his next visit or consideration of the addition of trazodone. Additionally, I have encourage the patient to speak to Dr. Pascal Lux about his problems and working on his coping skills. I encouraged him to exercise daily. He will take diazepam 5mg  BID PRN. No refills will be given before 1 months time.

## 2012-06-10 ENCOUNTER — Other Ambulatory Visit: Payer: Self-pay | Admitting: Family Medicine

## 2012-06-10 NOTE — Telephone Encounter (Signed)
Message left on our voicemail from Dale at [facility].  Patient was given Rx for Valium 5mg  tabs #60---take 1 tablet daily prn anxiety/sleep.  Patient states he takes Valium BID---1 in daytime for anxiety and 1 QHS for sleep.  Will need to update directions due to patient has orange card it it only covers #30 day supply based on the prescribed directions.  Earlier message routed to Dr. [name].  Gaylene Brooks, RN

## 2012-06-10 NOTE — Telephone Encounter (Signed)
Patient is calling for a refill on Diazepam.  When he picked up the last refill, he only got 30 and the pharmacy won't refill again until they hear from Dr. [name].  It looks like it might be early if he had gotten 60, but he only got 30.  He is currently out of this medication.

## 2012-06-10 NOTE — Telephone Encounter (Signed)
Called and spoke with patient and pharmacy. Because he has orange card only 30 tabs were filled because the directions say take 1 tab daily as needed and only one month supply can be filled at a time. Patient states he was told to take 1/2 in the morning and 1/2 in the evening and an extra one at night for sleep. If this are the correct directions the Rx needs to be changed to say that or he cannot have any refills until 12/07. Forward to PCP to review.Tajanae Guilbault, Rodena Medin

## 2012-06-10 NOTE — Telephone Encounter (Signed)
I called [name] and changed the instruction on the diazepam to be 2 pills daily PRN for anxiety and sleep. Therefore, 30 more pills will be dispensed to the patient.

## 2012-06-16 ENCOUNTER — Telehealth (INDEPENDENT_AMBULATORY_CARE_PROVIDER_SITE_OTHER): Payer: Self-pay

## 2012-06-16 NOTE — Telephone Encounter (Signed)
Pt's wife called stating they are going out of town and need refill on hydocodone. She called [facility] and was told they need a written rx since it takes 2 days for refill turn arounds at hospital pharmacy. Please call her at [phone] when she can pick up rx today.

## 2012-06-22 ENCOUNTER — Telehealth (INDEPENDENT_AMBULATORY_CARE_PROVIDER_SITE_OTHER): Payer: Self-pay | Admitting: General Surgery

## 2012-06-22 ENCOUNTER — Telehealth (INDEPENDENT_AMBULATORY_CARE_PROVIDER_SITE_OTHER): Payer: Self-pay

## 2012-06-22 NOTE — Telephone Encounter (Signed)
Spoke with pt's wife and informed her that what michelle told them is correct.  They just wanted some reassurance.  I asked them to call me if it changes at all and to call me when they get back in town so that I can have an update.  We already have an appt scheduled for them to come in on 12/9 but I informed her that if for some reason it gets worse between now and then, i would bring them in sooner.  She was comforted by this and said she would call if it gets worse or when they get back in town.

## 2012-06-22 NOTE — Telephone Encounter (Signed)
Patients wife called in stating that a suture came out from wound site. He was bleeding when it happened but that stopped. Now no drainage. Told them sometimes sutures make there way up to skin surface and that usually we would snip the suture in the office but that since it made its way out I felt it was fine. They will keep a look at area and call for sooner appointment if area starts bleeding, draining, or patients starts having fevers. They are currently in [place] and will be coming back home in the next day or 2. They asked for Penni Bombard to call them and I told them I would pass the message along.

## 2012-06-23 ENCOUNTER — Encounter (INDEPENDENT_AMBULATORY_CARE_PROVIDER_SITE_OTHER): Payer: Self-pay | Admitting: General Surgery

## 2012-06-23 ENCOUNTER — Encounter: Payer: Self-pay | Admitting: Home Health Services

## 2012-06-26 ENCOUNTER — Ambulatory Visit (INDEPENDENT_AMBULATORY_CARE_PROVIDER_SITE_OTHER): Payer: No Typology Code available for payment source | Admitting: Family Medicine

## 2012-06-26 ENCOUNTER — Encounter: Payer: Self-pay | Admitting: Family Medicine

## 2012-06-26 VITALS — BP 143/85 | HR 74 | Ht 70.0 in | Wt 176.8 lb

## 2012-06-26 DIAGNOSIS — R030 Elevated blood-pressure reading, without diagnosis of hypertension: Secondary | ICD-10-CM

## 2012-06-26 DIAGNOSIS — F410 Panic disorder [episodic paroxysmal anxiety] without agoraphobia: Secondary | ICD-10-CM

## 2012-06-26 DIAGNOSIS — G4700 Insomnia, unspecified: Secondary | ICD-10-CM

## 2012-06-26 MED ORDER — DIAZEPAM 5 MG PO TABS: 5.0000 mg | ORAL_TABLET | Freq: Two times a day (BID) | ORAL | Status: DC | PRN

## 2012-06-26 NOTE — Patient Instructions (Signed)
Continue with with diazepam as needed and the daily lexapro.   I recommend that you meet with our psychologist, Dr. Spero Geralds, for help dealing with your stress.  You can schedule an appointment with her by calling her directly at [phone].   Follow up in 1-2 months or sooner as needed.   Take Care,   Dr. [name]

## 2012-06-28 DIAGNOSIS — I10 Essential (primary) hypertension: Secondary | ICD-10-CM | POA: Insufficient documentation

## 2012-06-28 NOTE — Assessment & Plan Note (Signed)
BP Readings from Last 3 Encounters:  06/26/12 143/85  06/01/12 142/86  05/28/12 136/83   Continue to monitor. May need intervention with medicine as he cannot exercise right now.

## 2012-06-28 NOTE — Progress Notes (Signed)
  Subjective:    Patient ID: Timothy [name], male    DOB: May 22, 1966, 46 y.o.   MRN: [id number]  HPI  46 year old M who presents for follow up of insomnia and anxiety.   Has been taking diazepam 5 mg PO BID PRN for anxiety and sleep. He notes that sleep and anxiety are much improved. Being unable to work 2/2 to his recent sigmoid colectomy w/ diverted colostomy in August 2013 is his biggest stressor. This is compounded by being unable to be physically active, spending more time indoors with his children and wife, and having large medical bills to cover due to the injury. He did not speak with Dr. Pascal Lux as I recommended at the last visit for anxiety management. He notes that his anger has subsided since he stopped taking ambien 1 month ago. He continues to take Lexapro 20 mg daily without side effects. He would like to know today if we can increase the dose of lexapro or diazepam to help with his agitation when stuck in the house with his children and wife.   Insomnia - well controlled with  Diazepam. Use 0.5 -1 tabs almost every night. Fewer nighttime awakenings. No adverse side effects of medication.   Review of Systems Positive for tobacco use Negative for alcohol use, nausea, vomiting, abdominal pain, bloody ostomy output      Objective:   Physical Exam BP 143/85  Pulse 74  Ht 5\' 10"  (1.778 m)  Wt 176 lb 12.8 oz (80.196 kg)  BMI 25.37 kg/m2 Gen: middle age white male, well appearing, NAD, pleasant and conversant Abd: well healing midline vertical incision without good granulation tissue, ostomy in LUQ without problem Neuro/Psych: A&Ox4, normal affect, speech, and thought content     Assessment & Plan:

## 2012-06-28 NOTE — Assessment & Plan Note (Signed)
Patient counseled extensively on need to develop better coping strategies for the most stressful situation, such as being inside around his family for a prolonged duration. He was instructed to medications cannot resolve this and over-reliance on medication could cause adverse health problems in the future. He is in agreement with this. He is also agreeable to contacting Dr. Pascal Lux for counseling on behavior modification. Continue Lexapro 20 mg daily and Diazepam 5 mg BID PRN for panic attacks and sleep. F/u in 2 months.

## 2012-06-28 NOTE — Assessment & Plan Note (Signed)
Continue to avoid ambien. Patient may take Diazepam as need, but will need to wean off in near future.

## 2012-06-29 ENCOUNTER — Telehealth: Payer: Self-pay | Admitting: Psychology

## 2012-06-29 ENCOUNTER — Encounter (INDEPENDENT_AMBULATORY_CARE_PROVIDER_SITE_OTHER): Payer: Self-pay | Admitting: General Surgery

## 2012-06-29 ENCOUNTER — Ambulatory Visit (INDEPENDENT_AMBULATORY_CARE_PROVIDER_SITE_OTHER): Payer: PRIVATE HEALTH INSURANCE | Admitting: General Surgery

## 2012-06-29 VITALS — BP 134/88 | HR 88 | Temp 98.4°F | Ht 70.0 in | Wt 180.8 lb

## 2012-06-29 DIAGNOSIS — Z933 Colostomy status: Secondary | ICD-10-CM

## 2012-06-29 MED ORDER — POLYETHYLENE GLYCOL 3350 17 GM/SCOOP PO POWD: 255.0000 g | Freq: Once | ORAL | Status: DC

## 2012-06-29 NOTE — Addendum Note (Signed)
Addended by: Caleen Essex III on: 06/29/2012 11:10 AM   Modules accepted: Orders

## 2012-06-29 NOTE — Telephone Encounter (Signed)
Pt left a VM on Friday.  Returned call today after reviewing Dr. Yetta Numbers note.  Patient says being cooped up in the house is tough for him.  Wants to do what Dr. [name] recommends.  He is excited to go for surgery for a colostomy takedown on 07/23/12.  He expects to be in the hospital 7-10 days and then have a 3-4 month recuperation period.  I can't get him in until after the new year which is when he will be having his surgery.  Discussed options.  He agreed to call me post-surgery when the dust has settled and schedule if anxiety / depression remain an issue.  Will alert Dr. [name].    Of note:  Previous therapy experience remotely - 1993 when going through first divorce.  Discussed referral elsewhere but with holidays, impending surgery and a lack of insurance, decided it was best to follow here in the future.

## 2012-06-29 NOTE — Patient Instructions (Addendum)
Quit smoking. 

## 2012-06-29 NOTE — Addendum Note (Signed)
Addended byLiliana Cline on: 06/29/2012 10:50 AM   Modules accepted: Orders

## 2012-06-29 NOTE — Progress Notes (Signed)
Subjective:     Patient ID: Timothy [name], male   DOB: 12/08/1965, 46 y.o.   MRN: [id number]  HPI The patient is a 46-year-old white male who is several months status post partial colectomy. His postoperative course was complicated by leak requiring reoperation and colostomy. He did have some fascial separation during his postoperative period. He is now ready to have his colostomy reversed. He has some mild discomfort in the right and to have his old incision. His ostomy is working well. His appetite is good and he is gaining some weight.  Review of Systems  Constitutional: Negative.   HENT: Negative.   Eyes: Negative.   Respiratory: Negative.   Cardiovascular: Negative.   Gastrointestinal: Negative.   Genitourinary: Negative.   Musculoskeletal: Negative.   Skin: Negative.   Neurological: Negative.   Hematological: Negative.   Psychiatric/Behavioral: Negative.        Objective:   Physical Exam  Constitutional: He is oriented to person, place, and time. He appears well-developed and well-nourished.  HENT:  Head: Normocephalic and atraumatic.  Eyes: Conjunctivae normal and EOM are normal. Pupils are equal, round, and reactive to light.  Neck: Normal range of motion. Neck supple.  Cardiovascular: Normal rate, regular rhythm and normal heart sounds.   Abdominal: Soft. Bowel sounds are normal.       The midline incision is almost completely healed. His ostomy is pink and productive.  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  Neurological: He is alert and oriented to person, place, and time.  Skin: Skin is warm and dry.  Psychiatric: He has a normal mood and affect. His behavior is normal.       Assessment:     The patient is several months after a colostomy and is now ready to have the colostomy reversed. 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 I've discussed with him in detail the risks  and benefits of the operation to reverse the colostomy and destroy the condyloma and he understands and wishes to proceed. This includes the risk of leak again. He has been smoking again and I have encouraged him to please stop    Plan:     We'll plan to get a contrast enema study to make sure he has no distal stricture. We will plan for her colostomy takedown and destruction of the recurrent anal condyloma      

## 2012-07-02 ENCOUNTER — Ambulatory Visit (HOSPITAL_COMMUNITY)
Admission: RE | Admit: 2012-07-02 | Discharge: 2012-07-02 | Disposition: A | Discharge status: Home / Self Care | Payer: Medicaid Other | Source: Ambulatory Visit | Attending: General Surgery | Admitting: General Surgery

## 2012-07-02 DIAGNOSIS — Z933 Colostomy status: Secondary | ICD-10-CM | POA: Insufficient documentation

## 2012-07-02 DIAGNOSIS — Z9049 Acquired absence of other specified parts of digestive tract: Secondary | ICD-10-CM | POA: Insufficient documentation

## 2012-07-02 MED ORDER — IOHEXOL 300 MG/ML  SOLN
150.0000 mL | Freq: Once | INTRAMUSCULAR | Status: AC | PRN
  Administered 2012-07-02: 150 mL via ORAL

## 2012-07-16 ENCOUNTER — Encounter (HOSPITAL_COMMUNITY): Payer: Self-pay | Admitting: Pharmacy Technician

## 2012-07-20 ENCOUNTER — Encounter (HOSPITAL_COMMUNITY): Payer: Self-pay

## 2012-07-20 ENCOUNTER — Encounter (HOSPITAL_COMMUNITY)
Admission: RE | Admit: 2012-07-20 | Discharge: 2012-07-20 | Disposition: A | Discharge status: Home / Self Care | Payer: Medicaid Other | Source: Ambulatory Visit | Attending: General Surgery | Admitting: General Surgery

## 2012-07-20 HISTORY — DX: Incisional hernia without obstruction or gangrene: K43.2

## 2012-07-20 HISTORY — DX: Depression, unspecified: F32.A

## 2012-07-20 HISTORY — DX: Major depressive disorder, single episode, unspecified: F32.9

## 2012-07-20 LAB — CBC
Hemoglobin: 15.3 g/dL (ref 13.0–17.0)
MCHC: 34.2 g/dL (ref 30.0–36.0)
RDW: 14.3 % (ref 11.5–15.5)
WBC: 10 10*3/uL (ref 4.0–10.5)

## 2012-07-20 LAB — SURGICAL PCR SCREEN
MRSA, PCR: POSITIVE — AB
Staphylococcus aureus: POSITIVE — AB

## 2012-07-20 NOTE — Pre-Procedure Instructions (Signed)
[address] Brass  07/20/2012   Your procedure is scheduled on: Thursday, January 2nd.  Report to [facility] at 9:00 AM.  Call this number if you have problems the morning of surgery: [phone]   Remember: Nothing to eat or drink after Midnight.      Take these medicines the morning of surgery with A SIP OF WATER: Escitalopram (Lexapro).  May take Benadryl and Hydrocodone- Acetaminophen (Vicodan).   Stop taking Aspirin, Coumadin, Plavix, Effient and Herbal medications, NSAIDs ie: Ibuprofen,  Advil,Naproxen or any medication containing Aspirin.    Do not wear jewelry, make-up or nail polish.  Do not wear lotions, powders, or perfumes. You may wear deodorant.  Do not shave 48 hours prior to surgery. Men may shave face and neck.  Do not bring valuables to the hospital.  Contacts, dentures or bridgework may not be worn into surgery.  Leave suitcase in the car. After surgery it may be brought to your room.  For patients admitted to the hospital, checkout time is 11:00 AM the day of discharge.   Patients discharged the day of surgery will not be allowed to drive home.  Name and phone number of your driver: NA   Special Instructions: Shower using CHG 2 nights before surgery and the night before surgery.  If you shower the day of surgery use CHG.  Use special wash - you have one bottle of CHG for all showers.  You should use approximately 1/3 of the bottle for each shower.  Bowel prep as instructed by Dr Carolynne Edouard.   Please read over the following fact sheets that you were given: Pain Booklet, Coughing and Deep Breathing and Surgical Site Infection Prevention

## 2012-07-21 ENCOUNTER — Telehealth (INDEPENDENT_AMBULATORY_CARE_PROVIDER_SITE_OTHER): Payer: Self-pay | Admitting: General Surgery

## 2012-07-21 NOTE — Telephone Encounter (Signed)
He is having surgery in 2 days.  He has a headache and wonders if he should take Tylenol rather than Ibuprofen.  I agreed with his choice and told him to try and avoid aspirin and Ibuprofen.

## 2012-07-22 MED ORDER — DEXTROSE 5 % IV SOLN
2.0000 g | INTRAVENOUS | Status: AC
  Administered 2012-07-23: 2 g via INTRAVENOUS
  Filled 2012-07-22: qty 2

## 2012-07-23 ENCOUNTER — Inpatient Hospital Stay (HOSPITAL_COMMUNITY)
Admission: RE | Admit: 2012-07-23 | Discharge: 2012-08-04 | DRG: 330 | Disposition: A | Discharge status: Home Health | Payer: Medicaid Other | Source: Ambulatory Visit | Attending: General Surgery | Admitting: General Surgery

## 2012-07-23 ENCOUNTER — Ambulatory Visit (HOSPITAL_COMMUNITY): Payer: Medicaid Other | Admitting: Anesthesiology

## 2012-07-23 ENCOUNTER — Encounter (HOSPITAL_COMMUNITY)
Admission: RE | Disposition: A | Discharge status: Home / Self Care | Payer: Self-pay | Source: Ambulatory Visit | Attending: General Surgery

## 2012-07-23 ENCOUNTER — Encounter (HOSPITAL_COMMUNITY): Payer: Self-pay | Admitting: Anesthesiology

## 2012-07-23 ENCOUNTER — Encounter (HOSPITAL_COMMUNITY): Payer: Self-pay | Admitting: *Deleted

## 2012-07-23 DIAGNOSIS — A630 Anogenital (venereal) warts: Secondary | ICD-10-CM | POA: Diagnosis present

## 2012-07-23 DIAGNOSIS — G4730 Sleep apnea, unspecified: Secondary | ICD-10-CM | POA: Diagnosis present

## 2012-07-23 DIAGNOSIS — K219 Gastro-esophageal reflux disease without esophagitis: Secondary | ICD-10-CM | POA: Diagnosis present

## 2012-07-23 DIAGNOSIS — Y832 Surgical operation with anastomosis, bypass or graft as the cause of abnormal reaction of the patient, or of later complication, without mention of misadventure at the time of the procedure: Secondary | ICD-10-CM | POA: Diagnosis not present

## 2012-07-23 DIAGNOSIS — K560 Paralytic ileus: Secondary | ICD-10-CM | POA: Diagnosis not present

## 2012-07-23 DIAGNOSIS — Z433 Encounter for attention to colostomy: Principal | ICD-10-CM

## 2012-07-23 DIAGNOSIS — I209 Angina pectoris, unspecified: Secondary | ICD-10-CM | POA: Diagnosis present

## 2012-07-23 DIAGNOSIS — F3289 Other specified depressive episodes: Secondary | ICD-10-CM | POA: Diagnosis present

## 2012-07-23 DIAGNOSIS — F17200 Nicotine dependence, unspecified, uncomplicated: Secondary | ICD-10-CM | POA: Diagnosis present

## 2012-07-23 DIAGNOSIS — K929 Disease of digestive system, unspecified: Secondary | ICD-10-CM | POA: Diagnosis not present

## 2012-07-23 DIAGNOSIS — F411 Generalized anxiety disorder: Secondary | ICD-10-CM | POA: Diagnosis present

## 2012-07-23 DIAGNOSIS — Z01812 Encounter for preprocedural laboratory examination: Secondary | ICD-10-CM

## 2012-07-23 DIAGNOSIS — T8140XA Infection following a procedure, unspecified, initial encounter: Secondary | ICD-10-CM | POA: Diagnosis not present

## 2012-07-23 DIAGNOSIS — F329 Major depressive disorder, single episode, unspecified: Secondary | ICD-10-CM | POA: Diagnosis present

## 2012-07-23 HISTORY — PX: LESION DESTRUCTION: SHX5236

## 2012-07-23 HISTORY — PX: COLOSTOMY TAKEDOWN: SHX5783

## 2012-07-23 HISTORY — PX: LAPAROTOMY: SHX154

## 2012-07-23 HISTORY — PX: ABDOMINAL EXPLORATION SURGERY: SHX538

## 2012-07-23 HISTORY — DX: Personal history of other diseases of the digestive system: Z87.19

## 2012-07-23 HISTORY — PX: LYSIS OF ADHESION: SHX5961

## 2012-07-23 HISTORY — DX: Migraine, unspecified, not intractable, without status migrainosus: G43.909

## 2012-07-23 SURGERY — CLOSURE, COLOSTOMY
Anesthesia: General | Site: Anus | Wound class: Contaminated

## 2012-07-23 MED ORDER — DIPHENHYDRAMINE HCL 50 MG/ML IJ SOLN: 12.5000 mg | Freq: Four times a day (QID) | INTRAMUSCULAR | Status: DC | PRN

## 2012-07-23 MED ORDER — HYDROMORPHONE HCL PF 1 MG/ML IJ SOLN
INTRAMUSCULAR | Status: DC | PRN
  Administered 2012-07-23 (×2): 0.5 mg via INTRAVENOUS

## 2012-07-23 MED ORDER — LACTATED RINGERS IV SOLN
INTRAVENOUS | Status: DC
  Administered 2012-07-23 (×4): via INTRAVENOUS

## 2012-07-23 MED ORDER — DIBUCAINE 1 % RE OINT
TOPICAL_OINTMENT | Freq: Three times a day (TID) | RECTAL | Status: DC
  Administered 2012-07-23 – 2012-08-03 (×18): via RECTAL
  Filled 2012-07-23 (×2): qty 28

## 2012-07-23 MED ORDER — FENTANYL CITRATE 0.05 MG/ML IJ SOLN
INTRAMUSCULAR | Status: DC | PRN
  Administered 2012-07-23: 125 ug via INTRAVENOUS
  Administered 2012-07-23: 50 ug via INTRAVENOUS
  Administered 2012-07-23: 100 ug via INTRAVENOUS
  Administered 2012-07-23 (×2): 50 ug via INTRAVENOUS
  Administered 2012-07-23: 75 ug via INTRAVENOUS
  Administered 2012-07-23: 100 ug via INTRAVENOUS

## 2012-07-23 MED ORDER — LIDOCAINE HCL (CARDIAC) 20 MG/ML IV SOLN
INTRAVENOUS | Status: DC | PRN
  Administered 2012-07-23: 100 mg via INTRAVENOUS

## 2012-07-23 MED ORDER — MIDAZOLAM HCL 5 MG/5ML IJ SOLN
INTRAMUSCULAR | Status: DC | PRN
  Administered 2012-07-23: 2 mg via INTRAVENOUS

## 2012-07-23 MED ORDER — ALVIMOPAN 12 MG PO CAPS
12.0000 mg | ORAL_CAPSULE | Freq: Once | ORAL | Status: AC
  Administered 2012-07-23: 12 mg via ORAL
  Filled 2012-07-23 (×2): qty 1

## 2012-07-23 MED ORDER — PROPOFOL 10 MG/ML IV BOLUS
INTRAVENOUS | Status: DC | PRN
  Administered 2012-07-23: 200 mg via INTRAVENOUS

## 2012-07-23 MED ORDER — CHLORHEXIDINE GLUCONATE 0.12 % MT SOLN
15.0000 mL | Freq: Two times a day (BID) | OROMUCOSAL | Status: DC
  Administered 2012-07-23 – 2012-07-28 (×10): 15 mL via OROMUCOSAL
  Filled 2012-07-23 (×7): qty 15

## 2012-07-23 MED ORDER — CHLORHEXIDINE GLUCONATE CLOTH 2 % EX PADS
6.0000 | MEDICATED_PAD | Freq: Every day | CUTANEOUS | Status: AC
  Administered 2012-07-24 – 2012-07-27 (×4): 6 via TOPICAL

## 2012-07-23 MED ORDER — GLYCOPYRROLATE 0.2 MG/ML IJ SOLN
INTRAMUSCULAR | Status: DC | PRN
  Administered 2012-07-23: 0.4 mg via INTRAVENOUS

## 2012-07-23 MED ORDER — METOCLOPRAMIDE HCL 5 MG/ML IJ SOLN: 10.0000 mg | Freq: Once | INTRAMUSCULAR | Status: DC | PRN

## 2012-07-23 MED ORDER — POVIDONE-IODINE 10 % EX OINT
TOPICAL_OINTMENT | CUTANEOUS | Status: DC | PRN
  Administered 2012-07-23: 1 via TOPICAL

## 2012-07-23 MED ORDER — ONDANSETRON HCL 4 MG/2ML IJ SOLN
4.0000 mg | Freq: Four times a day (QID) | INTRAMUSCULAR | Status: DC | PRN
  Administered 2012-07-25 (×2): 4 mg via INTRAVENOUS
  Filled 2012-07-23 (×3): qty 2

## 2012-07-23 MED ORDER — ONDANSETRON HCL 4 MG/2ML IJ SOLN
INTRAMUSCULAR | Status: DC | PRN
  Administered 2012-07-23: 4 mg via INTRAVENOUS

## 2012-07-23 MED ORDER — ONDANSETRON HCL 4 MG PO TABS: 4.0000 mg | ORAL_TABLET | Freq: Four times a day (QID) | ORAL | Status: DC | PRN

## 2012-07-23 MED ORDER — HYDROMORPHONE HCL PF 1 MG/ML IJ SOLN
0.2500 mg | INTRAMUSCULAR | Status: DC | PRN
  Administered 2012-07-23: 16:00:00 via INTRAVENOUS
  Administered 2012-07-23 (×3): 0.5 mg via INTRAVENOUS

## 2012-07-23 MED ORDER — ONDANSETRON HCL 4 MG/2ML IJ SOLN: 4.0000 mg | Freq: Four times a day (QID) | INTRAMUSCULAR | Status: DC | PRN

## 2012-07-23 MED ORDER — HYDROMORPHONE HCL PF 1 MG/ML IJ SOLN
INTRAMUSCULAR | Status: AC
  Filled 2012-07-23: qty 1

## 2012-07-23 MED ORDER — KCL IN DEXTROSE-NACL 20-5-0.9 MEQ/L-%-% IV SOLN
INTRAVENOUS | Status: DC
  Administered 2012-07-23 – 2012-07-24 (×3): 1000 mL via INTRAVENOUS
  Administered 2012-07-24 – 2012-07-25 (×3): via INTRAVENOUS
  Administered 2012-07-25: 125 mL/h via INTRAVENOUS
  Administered 2012-07-25 – 2012-07-30 (×9): via INTRAVENOUS
  Administered 2012-07-31: 1000 mL via INTRAVENOUS
  Administered 2012-08-04: 20 mL/h via INTRAVENOUS
  Filled 2012-07-23 (×22): qty 1000

## 2012-07-23 MED ORDER — NEOSTIGMINE METHYLSULFATE 1 MG/ML IJ SOLN
INTRAMUSCULAR | Status: DC | PRN
  Administered 2012-07-23: 3 mg via INTRAVENOUS

## 2012-07-23 MED ORDER — HYDROMORPHONE 0.3 MG/ML IV SOLN
INTRAVENOUS | Status: DC
  Administered 2012-07-23: 2.7 mg via INTRAVENOUS
  Administered 2012-07-23: 17:00:00 via INTRAVENOUS
  Administered 2012-07-24: 3.3 mg via INTRAVENOUS
  Administered 2012-07-24: 5.9 mg via INTRAVENOUS
  Administered 2012-07-24: 11:00:00 via INTRAVENOUS
  Administered 2012-07-24: 3 mg via INTRAVENOUS
  Administered 2012-07-24: 4.17 mg via INTRAVENOUS
  Administered 2012-07-24: 1.7 mg via INTRAVENOUS
  Administered 2012-07-24 – 2012-07-25 (×3): via INTRAVENOUS
  Administered 2012-07-25: 2.7 mg via INTRAVENOUS
  Administered 2012-07-25: 1.8 mg via INTRAVENOUS
  Administered 2012-07-25: 0.6 mg via INTRAVENOUS
  Administered 2012-07-25 (×2): 1.7 mg via INTRAVENOUS
  Administered 2012-07-26: 3.9 mg via INTRAVENOUS
  Administered 2012-07-26: 1.9 mg via INTRAVENOUS
  Administered 2012-07-26: 0.9 mg via INTRAVENOUS
  Administered 2012-07-26: 1.6 mg via INTRAVENOUS
  Administered 2012-07-26: 17:00:00 via INTRAVENOUS
  Administered 2012-07-26: 1.2 mg via INTRAVENOUS
  Administered 2012-07-27: 2.1 mg via INTRAVENOUS
  Administered 2012-07-27: 2.7 mg via INTRAVENOUS
  Administered 2012-07-27: 1.8 mg via INTRAVENOUS
  Administered 2012-07-27: 1.5 mg via INTRAVENOUS
  Administered 2012-07-27: 1.8 mg via INTRAVENOUS
  Administered 2012-07-27: 5.7 mg via INTRAVENOUS
  Administered 2012-07-28: 1.8 mg via INTRAVENOUS
  Administered 2012-07-28: 2.7 mg via INTRAVENOUS
  Administered 2012-07-28: 04:00:00 via INTRAVENOUS
  Filled 2012-07-23 (×10): qty 25

## 2012-07-23 MED ORDER — MUPIROCIN 2 % EX OINT
1.0000 "application " | TOPICAL_OINTMENT | Freq: Two times a day (BID) | CUTANEOUS | Status: AC
  Administered 2012-07-24 – 2012-07-28 (×8): 1 via NASAL
  Filled 2012-07-23: qty 22

## 2012-07-23 MED ORDER — ALVIMOPAN 12 MG PO CAPS
12.0000 mg | ORAL_CAPSULE | Freq: Two times a day (BID) | ORAL | Status: DC
  Administered 2012-07-24 – 2012-07-29 (×11): 12 mg via ORAL
  Filled 2012-07-23 (×13): qty 1

## 2012-07-23 MED ORDER — VECURONIUM BROMIDE 10 MG IV SOLR
INTRAVENOUS | Status: DC | PRN
  Administered 2012-07-23 (×5): 1 mg via INTRAVENOUS
  Administered 2012-07-23: 2 mg via INTRAVENOUS
  Administered 2012-07-23: 1 mg via INTRAVENOUS

## 2012-07-23 MED ORDER — CHLORHEXIDINE GLUCONATE 4 % EX LIQD: 1.0000 "application " | Freq: Once | CUTANEOUS | Status: DC

## 2012-07-23 MED ORDER — NALOXONE HCL 0.4 MG/ML IJ SOLN: 0.4000 mg | INTRAMUSCULAR | Status: DC | PRN

## 2012-07-23 MED ORDER — SURGILUBE EX GEL
CUTANEOUS | Status: DC | PRN
  Administered 2012-07-23: 1 via TOPICAL

## 2012-07-23 MED ORDER — DIBUCAINE 1 % RE OINT
TOPICAL_OINTMENT | RECTAL | Status: AC
  Filled 2012-07-23: qty 28

## 2012-07-23 MED ORDER — DIPHENHYDRAMINE HCL 12.5 MG/5ML PO ELIX
12.5000 mg | ORAL_SOLUTION | Freq: Four times a day (QID) | ORAL | Status: DC | PRN
  Filled 2012-07-23: qty 5

## 2012-07-23 MED ORDER — SODIUM CHLORIDE 0.9 % IR SOLN
Status: DC | PRN
  Administered 2012-07-23 (×4): 1

## 2012-07-23 MED ORDER — DIBUCAINE 1 % RE OINT
TOPICAL_OINTMENT | RECTAL | Status: DC | PRN
  Administered 2012-07-23: 1 via RECTAL

## 2012-07-23 MED ORDER — SODIUM CHLORIDE 0.9 % IJ SOLN: 9.0000 mL | INTRAMUSCULAR | Status: DC | PRN

## 2012-07-23 MED ORDER — DEXAMETHASONE SODIUM PHOSPHATE 4 MG/ML IJ SOLN
INTRAMUSCULAR | Status: DC | PRN
  Administered 2012-07-23: 4 mg via INTRAVENOUS

## 2012-07-23 MED ORDER — HYDROMORPHONE HCL PF 1 MG/ML IJ SOLN
INTRAMUSCULAR | Status: AC
  Administered 2012-07-23: 0.3 mg
  Filled 2012-07-23: qty 1

## 2012-07-23 MED ORDER — POVIDONE-IODINE 10 % EX OINT
TOPICAL_OINTMENT | CUTANEOUS | Status: AC
  Filled 2012-07-23: qty 28.35

## 2012-07-23 MED ORDER — BIOTENE DRY MOUTH MT LIQD
15.0000 mL | Freq: Two times a day (BID) | OROMUCOSAL | Status: DC
  Administered 2012-07-24 – 2012-07-29 (×12): 15 mL via OROMUCOSAL

## 2012-07-23 MED ORDER — ROCURONIUM BROMIDE 100 MG/10ML IV SOLN
INTRAVENOUS | Status: DC | PRN
  Administered 2012-07-23: 50 mg via INTRAVENOUS

## 2012-07-23 MED ORDER — HEMOSTATIC AGENTS (NO CHARGE) OPTIME
TOPICAL | Status: DC | PRN
  Administered 2012-07-23: 1

## 2012-07-23 MED ORDER — DIBUCAINE 1 % RE OINT: TOPICAL_OINTMENT | RECTAL | Status: DC | PRN

## 2012-07-23 SURGICAL SUPPLY — 68 items
BLADE SURG ROTATE 9660 (MISCELLANEOUS) IMPLANT
CANISTER SUCTION 2500CC (MISCELLANEOUS) ×8 IMPLANT
CHLORAPREP W/TINT 26ML (MISCELLANEOUS) IMPLANT
CLOTH BEACON ORANGE TIMEOUT ST (SAFETY) ×4 IMPLANT
COVER MAYO STAND STRL (DRAPES) ×4 IMPLANT
COVER SURGICAL LIGHT HANDLE (MISCELLANEOUS) ×4 IMPLANT
DRAPE LAPAROSCOPIC ABDOMINAL (DRAPES) ×4 IMPLANT
DRAPE PROXIMA HALF (DRAPES) ×8 IMPLANT
DRAPE UTILITY 15X26 W/TAPE STR (DRAPE) ×8 IMPLANT
DRAPE WARM FLUID 44X44 (DRAPE) ×4 IMPLANT
DRSG PAD ABDOMINAL 8X10 ST (GAUZE/BANDAGES/DRESSINGS) ×4 IMPLANT
ELECT BLADE 6.5 EXT (BLADE) ×4 IMPLANT
ELECT CAUTERY BLADE 6.4 (BLADE) ×4 IMPLANT
ELECT REM PT RETURN 9FT ADLT (ELECTROSURGICAL) ×4
ELECTRODE REM PT RTRN 9FT ADLT (ELECTROSURGICAL) ×3 IMPLANT
FILTER 7/8 IN (FILTER) ×4 IMPLANT
GEL ULTRASOUND 20GR AQUASONIC (MISCELLANEOUS) ×4 IMPLANT
GLOVE BIO SURGEON STRL SZ 6.5 (GLOVE) ×8 IMPLANT
GLOVE BIO SURGEON STRL SZ7.5 (GLOVE) ×20 IMPLANT
GLOVE BIOGEL PI IND STRL 7.0 (GLOVE) ×12 IMPLANT
GLOVE BIOGEL PI IND STRL 7.5 (GLOVE) ×9 IMPLANT
GLOVE BIOGEL PI INDICATOR 7.0 (GLOVE) ×4
GLOVE BIOGEL PI INDICATOR 7.5 (GLOVE) ×3
GLOVE ECLIPSE 6.5 STRL STRAW (GLOVE) ×4 IMPLANT
GLOVE ECLIPSE 7.5 STRL STRAW (GLOVE) ×8 IMPLANT
GLOVE SURG SS PI 7.0 STRL IVOR (GLOVE) ×8 IMPLANT
GOWN ISOLATION IMPERV (GOWNS) ×4 IMPLANT
GOWN PREVENTION PLUS XLARGE (GOWN DISPOSABLE) ×4 IMPLANT
GOWN PREVENTION PLUS XXLARGE (GOWN DISPOSABLE) ×12 IMPLANT
GOWN STRL NON-REIN LRG LVL3 (GOWN DISPOSABLE) ×28 IMPLANT
HEMOSTAT SNOW SURGICEL 2X4 (HEMOSTASIS) ×4 IMPLANT
KIT BASIN OR (CUSTOM PROCEDURE TRAY) ×4 IMPLANT
KIT ROOM TURNOVER OR (KITS) ×4 IMPLANT
LEGGING LITHOTOMY PAIR STRL (DRAPES) ×8 IMPLANT
LIGASURE IMPACT 36 18CM CVD LR (INSTRUMENTS) ×4 IMPLANT
NS IRRIG 1000ML POUR BTL (IV SOLUTION) ×20 IMPLANT
PACK GENERAL/GYN (CUSTOM PROCEDURE TRAY) ×4 IMPLANT
PAD ARMBOARD 7.5X6 YLW CONV (MISCELLANEOUS) ×8 IMPLANT
PAD SHARPS MAGNETIC DISPOSAL (MISCELLANEOUS) IMPLANT
SOLUTION BETADINE 4OZ (MISCELLANEOUS) ×4 IMPLANT
SPECIMEN JAR LARGE (MISCELLANEOUS) ×4 IMPLANT
SPECIMEN JAR MEDIUM (MISCELLANEOUS) ×4 IMPLANT
SPONGE GAUZE 4X4 12PLY (GAUZE/BANDAGES/DRESSINGS) ×8 IMPLANT
SPONGE LAP 18X18 X RAY DECT (DISPOSABLE) ×12 IMPLANT
STAPLER CIRC ILS CVD 33MM 37CM (STAPLE) ×4 IMPLANT
STAPLER PROXIMATE 75MM BLUE (STAPLE) ×4 IMPLANT
STAPLER VISISTAT 35W (STAPLE) ×8 IMPLANT
SUCTION POOLE TIP (SUCTIONS) ×4 IMPLANT
SUT NOVA 1 T20/GS 25DT (SUTURE) ×4 IMPLANT
SUT PDS AB 1 CTX 36 (SUTURE) ×4 IMPLANT
SUT PDS AB 1 TP1 96 (SUTURE) ×8 IMPLANT
SUT PROLENE 2 0 CT2 30 (SUTURE) IMPLANT
SUT PROLENE 2 0 KS (SUTURE) IMPLANT
SUT PROLENE 2 0 SH 30 (SUTURE) ×4 IMPLANT
SUT SILK 2 0 SH CR/8 (SUTURE) ×20 IMPLANT
SUT SILK 2 0 TIES 10X30 (SUTURE) ×4 IMPLANT
SUT SILK 3 0 SH CR/8 (SUTURE) ×4 IMPLANT
SUT SILK 3 0 TIES 10X30 (SUTURE) ×4 IMPLANT
SYR BULB IRRIGATION 50ML (SYRINGE) ×4 IMPLANT
TAPE CLOTH SURG 6X10 WHT LF (GAUZE/BANDAGES/DRESSINGS) ×8 IMPLANT
TOWEL OR 17X26 10 PK STRL BLUE (TOWEL DISPOSABLE) ×8 IMPLANT
TRAY FOLEY CATH 14FRSI W/METER (CATHETERS) ×4 IMPLANT
TRAY PROCTOSCOPIC FIBER OPTIC (SET/KITS/TRAYS/PACK) ×4 IMPLANT
TUBE CONNECTING 20X1/4 (TUBING) ×4 IMPLANT
UNDERPAD 30X30 INCONTINENT (UNDERPADS AND DIAPERS) ×4 IMPLANT
VACUUM HOSE 7/8X10 W/ WAND (MISCELLANEOUS) ×4 IMPLANT
WATER STERILE IRR 1000ML POUR (IV SOLUTION) IMPLANT
YANKAUER SUCT BULB TIP NO VENT (SUCTIONS) ×4 IMPLANT

## 2012-07-23 NOTE — Anesthesia Procedure Notes (Signed)
Procedure Name: Intubation Date/Time: 07/23/2012 10:55 AM Performed by: Elon Alas Pre-anesthesia Checklist: Patient identified, Timeout performed, Emergency Drugs available, Suction available and Patient being monitored Patient Re-evaluated:Patient Re-evaluated prior to inductionOxygen Delivery Method: Circle system utilized Preoxygenation: Pre-oxygenation with 100% oxygen Intubation Type: IV induction Ventilation: Mask ventilation without difficulty Laryngoscope Size: Mac and 4 Grade View: Grade I Tube type: Oral Tube size: 7.5 mm Number of attempts: 1 Airway Equipment and Method: Stylet Placement Confirmation: positive ETCO2,  ETT inserted through vocal cords under direct vision and breath sounds checked- equal and bilateral Secured at: 23 cm Tube secured with: Tape Dental Injury: Teeth and Oropharynx as per pre-operative assessment

## 2012-07-23 NOTE — H&P (View-Only) (Signed)
Subjective:     Patient ID: Timothy [name], male   DOB: August 03, 1965, 47 y.o.   MRN: [id number]  HPI The patient is a 47 year old white male who is several months status post partial colectomy. His postoperative course was complicated by leak requiring reoperation and colostomy. He did have some fascial separation during his postoperative period. He is now ready to have his colostomy reversed. He has some mild discomfort in the right and to have his old incision. His ostomy is working well. His appetite is good and he is gaining some weight.  Review of Systems  Constitutional: Negative.   HENT: Negative.   Eyes: Negative.   Respiratory: Negative.   Cardiovascular: Negative.   Gastrointestinal: Negative.   Genitourinary: Negative.   Musculoskeletal: Negative.   Skin: Negative.   Neurological: Negative.   Hematological: Negative.   Psychiatric/Behavioral: Negative.        Objective:   Physical Exam  Constitutional: He is oriented to person, place, and time. He appears well-developed and well-nourished.  HENT:  Head: Normocephalic and atraumatic.  Eyes: Conjunctivae normal and EOM are normal. Pupils are equal, round, and reactive to light.  Neck: Normal range of motion. Neck supple.  Cardiovascular: Normal rate, regular rhythm and normal heart sounds.   Abdominal: Soft. Bowel sounds are normal.       The midline incision is almost completely healed. His ostomy is pink and productive.  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  Neurological: He is alert and oriented to person, place, and time.  Skin: Skin is warm and dry.  Psychiatric: He has a normal mood and affect. His behavior is normal.       Assessment:     The patient is several months after a colostomy and is now ready to have the colostomy reversed. 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 I've discussed with him in detail the risks  and benefits of the operation to reverse the colostomy and destroy the condyloma and he understands and wishes to proceed. This includes the risk of leak again. He has been smoking again and I have encouraged him to please stop    Plan:     We'll plan to get a contrast enema study to make sure he has no distal stricture. We will plan for her colostomy takedown and destruction of the recurrent anal condyloma

## 2012-07-23 NOTE — Op Note (Signed)
07/23/2012  2:57 PM  PATIENT:  Timothy [name]  47 y.o. male  PRE-OPERATIVE DIAGNOSIS:  COLOSTOMY; ANAL CONDYLOMA  POST-OPERATIVE DIAGNOSIS:  COLOSTOMY; ANAL CONDYLOMA  PROCEDURE:  Procedure(s) (LRB) with comments: COLOSTOMY TAKEDOWN (N/A) DESTRUCTION LESION ANUS (N/A) - DESTRUCTION ANAL CONDYLOMA  SURGEON:  Surgeon(s) and Role:    * Robyne Askew, MD - Primary    * Romie Levee, MD - Assisting  PHYSICIAN ASSISTANT:   ASSISTANTS: Dr. Maisie Fus   ANESTHESIA:   general  EBL:  Total I/O In: 3400 [I.V.:3400] Out: 510 [Urine:335; Blood:175]  BLOOD ADMINISTERED:none  DRAINS: none   LOCAL MEDICATIONS USED:  NONE  SPECIMEN:  Source of Specimen:  left and sigmoid colon  DISPOSITION OF SPECIMEN:  PATHOLOGY  COUNTS:  YES  TOURNIQUET:  * No tourniquets in log *  DICTATION: .Dragon Dictation After informed consent was obtained the patient was brought to the operating room placed in the supine position on the operating table. After adequate induction of general anesthesia the patient was moved into the lithotomy position. His ostomy was closed with a 2-0 silk pursestring stitch. His abdomen was then prepped with Betadine as well as his perineum and draped in usual sterile manner. A midline incision was made with a 10 blade knife incorporating his old scar. The incision was carried through the skin and subcutaneous tissue sharply with electrocautery until the fascia of the anterior abdominal wall was encountered. The fascia was opened sharply with the electrocautery. The preperitoneal space was probed bluntly with a hemostat the peritoneum was opened next this was gained to the abdominal cavity. The wrist incision was opened under direct vision with the electrocautery. The scar was also excised sharply with electrocautery. There were some omental adhesion to the anterior abdominal wall which was taken down sharply with the Bovie as well as the LigaSure. There were interloop adhesions between  small bowel and colon that were all taken down sharply with Metzenbaum scissors. Once this was accomplished the ostomy was identified. The rectal stump was also identified and appeared to be healthy and mobile. An elliptical incision was then made with a 15 blade knife around the ostomy. Incision was carried through the skin and subcutaneous tissue sharply with the electrocautery until the dissection met the blunt dissection from within the abdomen. Once the ostomy was freed it was brought into the abdominal cavity. The left colon at this point was not long enough to reach the rectal stump. At this point we then mobilized the splenic flexure and left colon and brought the transverse colon down. We were able to preserve the blood supply to the transverse colon and checked this with a Doppler so that we knew we had pulsatile flow in the colon wall. We chose a spot to divide the transverse colon where it appeared to be healthy and still had enough mobility to get down to the rectal stump. We divided the mesentery this point sharply with the LigaSure. We placed a Allen clamp proximally and a cavagram distally and divided the colon between the 2 clamps with a 10 blade knife. We initially placed a 2-0 Prolene pursestring stitch in the proximal colon after sizing it to a 33 mm EEA. At this point Dr. Maisie Fus went below but was unable to feed the EEA stapler through the rectum because of the tortuosity of the rectum. At this point we decided to create a into and handsewn anastomosis. We excised the staple line on the rectal stump. We then created a shunt  and anastomosis using full-thickness interrupted 2-0 silk stitches. Once the anastomosis was finished we then clamped the colon above the anastomosis with a soft bowel clamp. Dr. Maisie Fus went below and did a rigid sigmoidoscopy and with saline in the pelvis and the colon completely distended there was no sign of air leakage from the anastomosis. We then evacuated the air from  the rectum. We inspected the abdomen there was a small amount of bleeding from the omentum in the left upper quadrant. This was controlled with the LigaSure and then we packed it with Surgicel and after several minutes the bleeding had stopped. Breast then was irrigated with copious amounts of saline. The fascia at the ostomy defect was closed with an inner layer of #1 Novafil stitches and then the outer layer of #1 PDS stitches. The fascia of the anterior abdominal wall was then closed with 2 #1 double-stranded looped PDS sutures. The skin incisions were closed with staples. Betadine ointment and sterile dressings were applied. Attention was then turned to the rectum. The patient had some condyloma the rectum. This area was fulgurated with the electrocautery. Dibucaine ointment was then applied to the rectum along with sterile dressings. The patient tolerated the procedure well. At the end of the case needle sponge and counterpressure. The patient was then awakened and taken to recovery in stable condition.  PLAN OF CARE: Admit to inpatient   PATIENT DISPOSITION:  PACU - hemodynamically stable.   Delay start of Pharmacological VTE agent (>24hrs) due to surgical blood loss or risk of bleeding: yes

## 2012-07-23 NOTE — Anesthesia Preprocedure Evaluation (Addendum)
Anesthesia Evaluation  Patient identified by MRN, date of birth, ID band Patient awake    Reviewed: Allergy & Precautions, H&P , NPO status , Patient's Chart, lab work & pertinent test results, reviewed documented beta blocker date and time   Airway Mallampati: II TM Distance: >3 FB Neck ROM: full    Dental  (+) Dental Advisory Given   Pulmonary sleep apnea , Current Smoker,  breath sounds clear to auscultation        Cardiovascular + angina Rhythm:regular     Neuro/Psych  Headaches, PSYCHIATRIC DISORDERS Anxiety Depression    GI/Hepatic Neg liver ROS, GERD-  Medicated and Controlled,  Endo/Other  negative endocrine ROS  Renal/GU negative Renal ROS  negative genitourinary   Musculoskeletal   Abdominal   Peds  Hematology   Anesthesia Other Findings See surgeon's H&P   Reproductive/Obstetrics negative OB ROS                          Anesthesia Physical Anesthesia Plan  ASA: III  Anesthesia Plan: General   Post-op Pain Management:    Induction: Intravenous  Airway Management Planned: Oral ETT  Additional Equipment:   Intra-op Plan:   Post-operative Plan: Extubation in OR  Informed Consent: I have reviewed the patients History and Physical, chart, labs and discussed the procedure including the risks, benefits and alternatives for the proposed anesthesia with the patient or authorized representative who has indicated his/her understanding and acceptance.   Dental advisory given  Plan Discussed with: Anesthesiologist and Surgeon  Anesthesia Plan Comments:         Anesthesia Quick Evaluation

## 2012-07-23 NOTE — Transfer of Care (Signed)
Immediate Anesthesia Transfer of Care Note  Patient: Timothy [name]  Procedure(s) Performed: Procedure(s) (LRB) with comments: COLOSTOMY TAKEDOWN (N/A) - Primary Anastomosis DESTRUCTION LESION ANUS (N/A) - DESTRUCTION ANAL CONDYLOMA LYSIS OF ADHESION (N/A) EXPLORATORY LAPAROTOMY (N/A)  Patient Location: PACU  Anesthesia Type:General  Level of Consciousness: awake, alert  and oriented  Airway & Oxygen Therapy: Patient Spontanous Breathing and Patient connected to nasal cannula oxygen  Post-op Assessment: Report given to PACU RN, Post -op Vital signs reviewed and stable and Patient moving all extremities X 4  Post vital signs: Reviewed and stable  Complications: No apparent anesthesia complications

## 2012-07-23 NOTE — Progress Notes (Signed)
Orthopedic Tech Progress Note Patient Details:  Timothy [name] September 07, 1965 [id number]  Ortho Devices Type of Ortho Device: Abdominal binder Ortho Device/Splint Interventions: [name] 07/23/2012, 6:12 PM

## 2012-07-23 NOTE — Progress Notes (Signed)
Report given to mark rn as caregiver 

## 2012-07-23 NOTE — Preoperative (Signed)
Beta Blockers   Reason not to administer Beta Blockers:Not Applicable 

## 2012-07-23 NOTE — Anesthesia Postprocedure Evaluation (Signed)
Anesthesia Post Note  Patient: Timothy [name]  Procedure(s) Performed: Procedure(s) (LRB): COLOSTOMY TAKEDOWN (N/A) DESTRUCTION LESION ANUS (N/A) LYSIS OF ADHESION (N/A) EXPLORATORY LAPAROTOMY (N/A)  Anesthesia type: general  Patient location: PACU  Post pain: Pain level controlled  Post assessment: Patient's Cardiovascular Status Stable  Last Vitals:  Filed Vitals:   07/23/12 1614  BP:   Pulse: 100  Temp:   Resp: 18    Post vital signs: Reviewed and stable  Level of consciousness: sedated  Complications: No apparent anesthesia complications

## 2012-07-23 NOTE — Interval H&P Note (Signed)
History and Physical Interval Note:  07/23/2012 10:04 AM  Timothy [name]  has presented today for surgery, with the diagnosis of colostomy, anal condyloma  The various methods of treatment have been discussed with the patient and family. After consideration of risks, benefits and other options for treatment, the patient has consented to  Colostomy takedown and destruction anal condyloma as a surgical intervention .  The patient's history has been reviewed, patient examined, no change in status, stable for surgery.  I have reviewed the patient's chart and labs.  Questions were answered to the patient's satisfaction.     TOTH III,Bren Steers S

## 2012-07-24 LAB — CBC
HCT: 39.5 % (ref 39.0–52.0)
Hemoglobin: 13.2 g/dL (ref 13.0–17.0)
MCHC: 33.4 g/dL (ref 30.0–36.0)
RBC: 4.51 MIL/uL (ref 4.22–5.81)
WBC: 14.8 10*3/uL — ABNORMAL HIGH (ref 4.0–10.5)

## 2012-07-24 LAB — BASIC METABOLIC PANEL
BUN: 10 mg/dL (ref 6–23)
Chloride: 103 mEq/L (ref 96–112)
GFR calc Af Amer: >90 mL/min (ref >90)
Potassium: 3.9 mEq/L (ref 3.5–5.1)
Sodium: 141 mEq/L (ref 135–145)

## 2012-07-24 MED ORDER — ENOXAPARIN SODIUM 40 MG/0.4ML ~~LOC~~ SOLN
40.0000 mg | SUBCUTANEOUS | Status: DC
  Administered 2012-07-24 – 2012-07-29 (×6): 40 mg via SUBCUTANEOUS
  Filled 2012-07-24 (×12): qty 0.4

## 2012-07-24 MED ORDER — ACETAMINOPHEN 10 MG/ML IV SOLN: 1000.0000 mg | Freq: Four times a day (QID) | INTRAVENOUS | Status: DC

## 2012-07-24 MED ORDER — ACETAMINOPHEN 10 MG/ML IV SOLN
1000.0000 mg | Freq: Once | INTRAVENOUS | Status: AC
  Administered 2012-07-24: 1000 mg via INTRAVENOUS
  Filled 2012-07-24: qty 100

## 2012-07-24 MED ORDER — ENOXAPARIN SODIUM 40 MG/0.4ML ~~LOC~~ SOLN
40.0000 mg | SUBCUTANEOUS | Status: DC
  Filled 2012-07-24: qty 0.4

## 2012-07-24 NOTE — Progress Notes (Signed)
General Surgery Note  LOS: 1 day  POD -  1 Day Post-Op Room - [phone]  Assessment/Plan: 1.  COLOSTOMY TAKEDOWN, DESTRUCTION LESION ANUS, LYSIS OF ADHESION, EXPLORATORY LAPAROTOMY - Dr. Carolynne Edouard - 07/23/2012  Doing okay early after surgery.  Needs to ambulate.   2.  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 3.  DVT prophylaxis - to start Lovenox 4.  History of smoking. 5.  Has foley - will leave in at least 24 more hours.  Subjective:  Doing well.  Wife at bedside.  Tried to answer questions. Objective:   Filed Vitals:   07/24/12 1032  BP: 166/91  Pulse: 108  Temp: 98.5 F (36.9 C)  Resp: 17     Intake/Output from previous day:  01/02 0701 - 01/03 0700 In: 4565 [I.V.:4565] Out: 1010 [Urine:835; Blood:175]  Intake/Output this shift:      Physical Exam:   General: WN WM who is alert and oriented.    HEENT: Normal. Pupils equal. .   Lungs: Clear.   Abdomen: Mild distention.  Has few bowel sounds.   Wound: Dressing slightly stained.   Lab Results:    [facility] 07/24/12 0550  WBC 14.8*  HGB 13.2  HCT 39.5  PLT 288    BMET   Basename 07/24/12 0550  NA 141  K 3.9  CL 103  CO2 25  GLUCOSE 158*  BUN 10  CREATININE 0.60  CALCIUM 8.7    PT/INR  No results found for this basename: LABPROT:2,INR:2 in the last 72 hours  ABG  No results found for this basename: PHART:2,PCO2:2,PO2:2,HCO3:2 in the last 72 hours   Studies/Results:  No results found.   Anti-infectives:   Anti-infectives     Start     Dose/Rate Route Frequency Ordered Stop   07/23/12 0600   cefOXitin (MEFOXIN) 2 g in dextrose 5 % 50 mL IVPB        2 g 100 mL/hr over 30 Minutes Intravenous On call to O.R. 07/22/12 2009 07/23/12 1101          Ovidio Kin, MD, FACS Pager: [phone],   [facility] Office: [phone] 07/24/2012

## 2012-07-24 NOTE — Progress Notes (Signed)
Patient with elevated BP and HR persisting this shift.  BP 156/86 checked manually.  Patient states "that it is not uncommon for my BP and HR to be elevated after surgery".  Dr. Lindie Spruce on call for Dr. Carolynne Edouard made aware.  No new orders.  Nursing will continue to monitor.

## 2012-07-24 NOTE — Progress Notes (Signed)
Utilization review completed. Kieu Quiggle, RN, BSN. 

## 2012-07-25 LAB — BASIC METABOLIC PANEL
BUN: 8 mg/dL (ref 6–23)
CO2: 26 mEq/L (ref 19–32)
Chloride: 104 mEq/L (ref 96–112)
Creatinine, Ser: 0.48 mg/dL — ABNORMAL LOW (ref 0.50–1.35)
Glucose, Bld: 122 mg/dL — ABNORMAL HIGH (ref 70–99)
Potassium: 3.5 mEq/L (ref 3.5–5.1)

## 2012-07-25 LAB — CBC WITH DIFFERENTIAL/PLATELET
Basophils Relative: 0 % (ref 0–1)
HCT: 35.2 % — ABNORMAL LOW (ref 39.0–52.0)
Hemoglobin: 11.7 g/dL — ABNORMAL LOW (ref 13.0–17.0)
Lymphs Abs: 1.4 10*3/uL (ref 0.7–4.0)
MCHC: 33.2 g/dL (ref 30.0–36.0)
Monocytes Absolute: 1.5 10*3/uL — ABNORMAL HIGH (ref 0.1–1.0)
Monocytes Relative: 12 % (ref 3–12)
Neutro Abs: 9.1 10*3/uL — ABNORMAL HIGH (ref 1.7–7.7)
Neutrophils Relative %: 76 % (ref 43–77)
RBC: 3.99 MIL/uL — ABNORMAL LOW (ref 4.22–5.81)

## 2012-07-25 NOTE — Progress Notes (Signed)
Pt's wife called RN tonight complaining that pt is not his usual self and has a violent demeanor. That the patient did not walk today. Pt is assessed and is alert/oriented x4, pleasant. 1st shift Rn reported that pt walk w/ tech. Before shift ended.Again tonight Pt walk w/ Clinical research associate in the hallway before going to sleep.

## 2012-07-25 NOTE — Progress Notes (Signed)
Patient ID: Timothy [name], male   DOB: 1966/05/19, 47 y.o.   MRN: [id number]   LOS: 2 days  POD#2  Subjective: Denies N/V/flatus. Pain controlled.  Objective: Vital signs in last 24 hours: Temp:  [98.5 F (36.9 C)-99.5 F (37.5 C)] 99 F (37.2 C) (01/04 0546) Pulse Rate:  [103-112] 112  (01/04 0546) Resp:  [10-20] 20  (01/04 0827) BP: (140-166)/(83-93) 148/88 mmHg (01/04 0546) SpO2:  [94 %-98 %] 95 % (01/04 0827) Last BM Date: 07/23/12  Lab Results:  CBC  Basename 07/25/12 0620 07/24/12 0550  WBC 12.0* 14.8*  HGB 11.7* 13.2  HCT 35.2* 39.5  PLT 235 288    General appearance: alert and no distress Resp: clear to auscultation bilaterally Cardio: regular rate and rhythm GI: Soft, scant BS, appropriate TTP. Incisions C/D/I.   Assessment/Plan: 1. COLOSTOMY TAKEDOWN, DESTRUCTION LESION ANUS, LYSIS OF ADHESION, EXPLORATORY LAPAROTOMY - Dr. Carolynne Edouard - 07/23/2012  Continue NPO  2. 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  3. DVT prophylaxis - Lovenox  4. History of smoking.  5. D/C foley    Freeman Caldron, PA-C Pager: [phone] General Trauma PA Pager: [phone]   07/25/2012

## 2012-07-25 NOTE — Progress Notes (Signed)
Agree with above, hold on diet until passing flatus, wound clean, ambulate, lovenox, scds

## 2012-07-25 NOTE — Progress Notes (Signed)
Md is made aware of wife's concern.

## 2012-07-26 NOTE — Progress Notes (Signed)
3 Days Post-Op  Subjective: No flatus or BM yet.  Feels some "grumbling" Pain adequately controlled Voiding  Objective: Vital signs in last 24 hours: Temp:  [97.9 F (36.6 C)-99 F (37.2 C)] 98.2 F (36.8 C) (01/05 0508) Pulse Rate:  [94-117] 94  (01/05 0508) Resp:  [16-22] 18  (01/05 0723) BP: (139-176)/(79-104) 139/79 mmHg (01/05 0508) SpO2:  [95 %-99 %] 96 % (01/05 0723) Last BM Date: 07/23/12 (prior to surgery)  Intake/Output from previous day: 01/04 0701 - 01/05 0700 In: 3173.4 [I.V.:3173.4] Out: 1450 [Urine:1450] Intake/Output this shift: Total I/O In: -  Out: 400 [Urine:400]  General appearance: alert, cooperative and no distress GI: Incision - mild bruising around midline incision; ostomy site - staple line intact Minimal bowel sounds  Lab Results:   Basename 07/25/12 0620 07/24/12 0550  WBC 12.0* 14.8*  HGB 11.7* 13.2  HCT 35.2* 39.5  PLT 235 288   BMET  Basename 07/25/12 0620 07/24/12 0550  NA 141 141  K 3.5 3.9  CL 104 103  CO2 26 25  GLUCOSE 122* 158*  BUN 8 10  CREATININE 0.48* 0.60  CALCIUM 9.0 8.7   PT/INR No results found for this basename: LABPROT:2,INR:2 in the last 72 hours ABG No results found for this basename: PHART:2,PCO2:2,PO2:2,HCO3:2 in the last 72 hours  Studies/Results: No results found.  Anti-infectives: Anti-infectives     Start     Dose/Rate Route Frequency Ordered Stop   07/23/12 0600   cefOXitin (MEFOXIN) 2 g in dextrose 5 % 50 mL IVPB        2 g 100 mL/hr over 30 Minutes Intravenous On call to O.R. 07/22/12 2009 07/23/12 1101          Assessment/Plan: s/p Procedure(s) (LRB) with comments: COLOSTOMY TAKEDOWN (N/A) - Primary Anastomosis DESTRUCTION LESION ANUS (N/A) - DESTRUCTION ANAL CONDYLOMA LYSIS OF ADHESION (N/A) EXPLORATORY LAPAROTOMY (N/A) Continue ice chips only while awaiting bowel function Encourage ambulation   LOS: 3 days    Slater Mcmanaman K. 07/26/2012

## 2012-07-27 ENCOUNTER — Encounter (HOSPITAL_COMMUNITY): Payer: Self-pay | Admitting: General Surgery

## 2012-07-27 MED ORDER — ACETAMINOPHEN 10 MG/ML IV SOLN
1000.0000 mg | Freq: Once | INTRAVENOUS | Status: AC
  Administered 2012-07-27: 1000 mg via INTRAVENOUS
  Filled 2012-07-27 (×2): qty 100

## 2012-07-27 NOTE — Progress Notes (Signed)
General Surgery Note  LOS: 4 days  POD -  4 Days Post-Op Room - [phone]  Assessment/Plan: 1.  COLOSTOMY TAKEDOWN, DESTRUCTION LESION ANUS, LYSIS OF ADHESION, EXPLORATORY LAPAROTOMY - Dr. Carolynne Edouard - 07/23/2012  On Entereg.  Still with ileus - continue NPO.     2.  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  On Bactroban nasal ointment. 3.  DVT prophylaxis - to start Lovenox 4.  History of smoking.  Subjective:  Doing well.  No nausea, but no BM or flatus.  Feels okay.  Wife at bedside.  Objective:   Filed Vitals:   07/27/12 0800  BP:   Pulse:   Temp:   Resp: 20     Intake/Output from previous day:  01/05 0701 - 01/06 0700 In: 2978.8 [I.V.:2978.8] Out: 1750 [Urine:1750]  Intake/Output this shift:      Physical Exam:   General: WN WM who is alert and oriented.    HEENT: Normal. Pupils equal. .   Lungs: Clear.   Abdomen: Mild distention.  Has good bowel sounds.   Wound: Staple line looks good.   Lab Results:     [facility] 07/25/12 0620  WBC 12.0*  HGB 11.7*  HCT 35.2*  PLT 235    BMET    Basename 07/25/12 0620  NA 141  K 3.5  CL 104  CO2 26  GLUCOSE 122*  BUN 8  CREATININE 0.48*  CALCIUM 9.0    PT/INR  No results found for this basename: LABPROT:2,INR:2 in the last 72 hours  ABG  No results found for this basename: PHART:2,PCO2:2,PO2:2,HCO3:2 in the last 72 hours   Studies/Results:  No results found.   Anti-infectives:   Anti-infectives     Start     Dose/Rate Route Frequency Ordered Stop   07/23/12 0600   cefOXitin (MEFOXIN) 2 g in dextrose 5 % 50 mL IVPB        2 g 100 mL/hr over 30 Minutes Intravenous On call to O.R. 07/22/12 2009 07/23/12 1101          Ovidio Kin, MD, FACS Pager: [phone],   [facility] Office: [phone] 07/27/2012

## 2012-07-27 NOTE — Progress Notes (Signed)
Orthopedic Tech Progress Note Patient Details:  Timothy [name] 08-11-1965 [id number]  Ortho Devices Type of Ortho Device: Abdominal binder Ortho Device/Splint Interventions: Ordered   Shawnie Pons 07/27/2012, 11:57 AM

## 2012-07-28 MED ORDER — OXYCODONE-ACETAMINOPHEN 5-325 MG PO TABS
1.0000 | ORAL_TABLET | ORAL | Status: DC | PRN
  Administered 2012-07-28 – 2012-08-04 (×34): 1 via ORAL
  Filled 2012-07-28 (×35): qty 1

## 2012-07-28 MED ORDER — HYDROCODONE-ACETAMINOPHEN 5-325 MG PO TABS: 1.0000 | ORAL_TABLET | ORAL | Status: DC | PRN

## 2012-07-28 MED ORDER — OXYCODONE HCL 5 MG PO TABS
5.0000 mg | ORAL_TABLET | ORAL | Status: DC | PRN
  Administered 2012-07-28 – 2012-08-04 (×33): 5 mg via ORAL
  Filled 2012-07-28 (×35): qty 1

## 2012-07-28 NOTE — Progress Notes (Signed)
Orthopedic Tech Progress Note Patient Details:  Timothy [name] 07/24/1965 [id number]  Ortho Devices Type of Ortho Device: Abdominal binder Ortho Device/Splint Interventions: [name] 07/28/2012, 5:19 PM

## 2012-07-28 NOTE — Progress Notes (Signed)
5 Days Post-Op  Subjective: No complaints. Still bloated. Just passed flatus for the first time this am  Objective: Vital signs in last 24 hours: Temp:  [98.2 F (36.8 C)-99.3 F (37.4 C)] 99.3 F (37.4 C) (01/07 0607) Pulse Rate:  [96-108] 97  (01/07 0607) Resp:  [16-20] 18  (01/07 0607) BP: (129-153)/(73-83) 133/75 mmHg (01/07 0607) SpO2:  [95 %-100 %] 98 % (01/07 0607) Last BM Date: 07/23/12  Intake/Output from previous day: 01/06 0701 - 01/07 0700 In: 1741 [I.V.:1741] Out: 1400 [Urine:1400] Intake/Output this shift:    GI: soft, mild tenderness. good bs and flatus. incision looks good  Lab Results:  No results found for this basename: WBC:2,HGB:2,HCT:2,PLT:2 in the last 72 hours BMET No results found for this basename: NA:2,K:2,CL:2,CO2:2,GLUCOSE:2,BUN:2,CREATININE:2,CALCIUM:2 in the last 72 hours PT/INR No results found for this basename: LABPROT:2,INR:2 in the last 72 hours ABG No results found for this basename: PHART:2,PCO2:2,PO2:2,HCO3:2 in the last 72 hours  Studies/Results: No results found.  Anti-infectives: Anti-infectives     Start     Dose/Rate Route Frequency Ordered Stop   07/23/12 0600   cefOXitin (MEFOXIN) 2 g in dextrose 5 % 50 mL IVPB        2 g 100 mL/hr over 30 Minutes Intravenous On call to O.R. 07/22/12 2009 07/23/12 1101          Assessment/Plan: s/p Procedure(s) (LRB) with comments: COLOSTOMY TAKEDOWN (N/A) - Primary Anastomosis DESTRUCTION LESION ANUS (N/A) - DESTRUCTION ANAL CONDYLOMA LYSIS OF ADHESION (N/A) EXPLORATORY LAPAROTOMY (N/A) Advance diet. Start clears Decrease ivf ambulate  LOS: 5 days    TOTH III,PAUL S 07/28/2012

## 2012-07-29 MED ORDER — POLYETHYLENE GLYCOL 3350 17 G PO PACK
17.0000 g | PACK | Freq: Every day | ORAL | Status: DC
  Administered 2012-07-29 – 2012-08-03 (×4): 17 g via ORAL
  Filled 2012-07-29 (×7): qty 1

## 2012-07-29 NOTE — Progress Notes (Signed)
Pharmacy Note  Patient receiving alvimopan post-op.  Has had BM x6 since surgery.  Will d/c alvimopan per P&T policy.  Thanks,  Marchelle Folks. Illene Bolus, PharmD, BCPS Clinical Pharmacist Pager: [phone] Pharmacy: [phone] 07/29/2012 11:46 AM

## 2012-07-29 NOTE — Progress Notes (Signed)
6 Days Post-Op  Subjective: No complaints. Feels pretty good  Objective: Vital signs in last 24 hours: Temp:  [98.2 F (36.8 C)-98.8 F (37.1 C)] 98.2 F (36.8 C) (01/08 0509) Pulse Rate:  [88-103] 88  (01/08 0509) Resp:  [16-20] 16  (01/08 0509) BP: (134-149)/(73-91) 135/91 mmHg (01/08 0509) SpO2:  [97 %-100 %] 98 % (01/08 0509) Last BM Date: 07/28/12  Intake/Output from previous day: 01/07 0701 - 01/08 0700 In: 959.5 [P.O.:220; I.V.:739.5] Out: 1000 [Urine:1000] Intake/Output this shift:    GI: soft, nontender. still mildly distended but starting to have bm's. incision looks good  Lab Results:  No results found for this basename: WBC:2,HGB:2,HCT:2,PLT:2 in the last 72 hours BMET No results found for this basename: NA:2,K:2,CL:2,CO2:2,GLUCOSE:2,BUN:2,CREATININE:2,CALCIUM:2 in the last 72 hours PT/INR No results found for this basename: LABPROT:2,INR:2 in the last 72 hours ABG No results found for this basename: PHART:2,PCO2:2,PO2:2,HCO3:2 in the last 72 hours  Studies/Results: No results found.  Anti-infectives: Anti-infectives     Start     Dose/Rate Route Frequency Ordered Stop   07/23/12 0600   cefOXitin (MEFOXIN) 2 g in dextrose 5 % 50 mL IVPB        2 g 100 mL/hr over 30 Minutes Intravenous On call to O.R. 07/22/12 2009 07/23/12 1101          Assessment/Plan: s/p Procedure(s) (LRB) with comments: COLOSTOMY TAKEDOWN (N/A) - Primary Anastomosis DESTRUCTION LESION ANUS (N/A) - DESTRUCTION ANAL CONDYLOMA LYSIS OF ADHESION (N/A) EXPLORATORY LAPAROTOMY (N/A) Advance diet. Start full liquids Will start miralax today ambulate  LOS: 6 days    TOTH III,Admiral Marcucci S 07/29/2012

## 2012-07-30 LAB — CBC WITH DIFFERENTIAL/PLATELET
Basophils Absolute: 0 10*3/uL (ref 0.0–0.1)
Basophils Relative: 0 % (ref 0–1)
Eosinophils Absolute: 0.1 10*3/uL (ref 0.0–0.7)
HCT: 33.3 % — ABNORMAL LOW (ref 39.0–52.0)
Hemoglobin: 11.1 g/dL — ABNORMAL LOW (ref 13.0–17.0)
Lymphocytes Relative: 15 % (ref 12–46)
Lymphs Abs: 2.1 10*3/uL (ref 0.7–4.0)
MCH: 29.2 pg (ref 26.0–34.0)
MCHC: 33.3 g/dL (ref 30.0–36.0)
MCV: 87.6 fL (ref 78.0–100.0)
Monocytes Absolute: 2 10*3/uL — ABNORMAL HIGH (ref 0.1–1.0)
Neutro Abs: 9.9 10*3/uL — ABNORMAL HIGH (ref 1.7–7.7)
RDW: 13.6 % (ref 11.5–15.5)

## 2012-07-30 LAB — BASIC METABOLIC PANEL
CO2: 27 mEq/L (ref 19–32)
Calcium: 9.1 mg/dL (ref 8.4–10.5)
Creatinine, Ser: 0.51 mg/dL (ref 0.50–1.35)
GFR calc non Af Amer: >90 mL/min (ref >90)
Glucose, Bld: 107 mg/dL — ABNORMAL HIGH (ref 70–99)
Sodium: 138 mEq/L (ref 135–145)

## 2012-07-30 MED ORDER — ESCITALOPRAM OXALATE 20 MG PO TABS
20.0000 mg | ORAL_TABLET | Freq: Every day | ORAL | Status: DC
  Administered 2012-07-31 – 2012-08-03 (×4): 20 mg via ORAL
  Filled 2012-07-30 (×5): qty 1

## 2012-07-30 MED ORDER — DIAZEPAM 5 MG PO TABS
5.0000 mg | ORAL_TABLET | Freq: Two times a day (BID) | ORAL | Status: DC | PRN
  Administered 2012-07-30 – 2012-08-03 (×8): 5 mg via ORAL
  Filled 2012-07-30 (×9): qty 1

## 2012-07-30 MED ORDER — PIPERACILLIN-TAZOBACTAM 3.375 G IVPB
3.3750 g | Freq: Three times a day (TID) | INTRAVENOUS | Status: DC
  Administered 2012-07-30 – 2012-08-04 (×15): 3.375 g via INTRAVENOUS
  Filled 2012-07-30 (×18): qty 50

## 2012-07-30 NOTE — Progress Notes (Signed)
7 Days Post-Op  Subjective: No complaints.  Objective: Vital signs in last 24 hours: Temp:  [98.8 F (37.1 C)-99.9 F (37.7 C)] 98.8 F (37.1 C) (01/09 0536) Pulse Rate:  [101-107] 101  (01/09 0536) Resp:  [18] 18  (01/09 0536) BP: (133-135)/(69-80) 133/80 mmHg (01/09 0536) SpO2:  [94 %-95 %] 94 % (01/09 0536) Last BM Date: 07/29/12  Intake/Output from previous day: 01/08 0701 - 01/09 0700 In: 1850 [P.O.:740; I.V.:1110] Out: 1575 [Urine:1575] Intake/Output this shift: Total I/O In: 240 [P.O.:240] Out: -   GI: soft, nontender. mild distension. good flatus and bm's. incision has slight redness to it. no drainage  Lab Results:  No results found for this basename: WBC:2,HGB:2,HCT:2,PLT:2 in the last 72 hours BMET No results found for this basename: NA:2,K:2,CL:2,CO2:2,GLUCOSE:2,BUN:2,CREATININE:2,CALCIUM:2 in the last 72 hours PT/INR No results found for this basename: LABPROT:2,INR:2 in the last 72 hours ABG No results found for this basename: PHART:2,PCO2:2,PO2:2,HCO3:2 in the last 72 hours  Studies/Results: No results found.  Anti-infectives: Anti-infectives     Start     Dose/Rate Route Frequency Ordered Stop   07/30/12 1400   piperacillin-tazobactam (ZOSYN) IVPB 3.375 g        3.375 g 12.5 mL/hr over 240 Minutes Intravenous 3 times per day 07/30/12 1300     07/23/12 0600   cefOXitin (MEFOXIN) 2 g in dextrose 5 % 50 mL IVPB        2 g 100 mL/hr over 30 Minutes Intravenous On call to O.R. 07/22/12 2009 07/23/12 1101          Assessment/Plan: s/p Procedure(s) (LRB) with comments: COLOSTOMY TAKEDOWN (N/A) - Primary Anastomosis DESTRUCTION LESION ANUS (N/A) - DESTRUCTION ANAL CONDYLOMA LYSIS OF ADHESION (N/A) EXPLORATORY LAPAROTOMY (N/A) Advance diet. Start soft diet Continue miralax Start Zosyn for cellulitis at incision Check wbc   LOS: 7 days    TOTH III,Thong Feeny S 07/30/2012

## 2012-07-31 NOTE — Progress Notes (Signed)
8 Days Post-Op  Subjective: Feels better. Low grade fever yesterday which is better today  Objective: Vital signs in last 24 hours: Temp:  [98 F (36.7 C)-99.5 F (37.5 C)] 98 F (36.7 C) (01/10 0628) Pulse Rate:  [98-107] 98  (01/10 0628) Resp:  [18-20] 18  (01/10 0628) BP: (126-132)/(75-77) 128/75 mmHg (01/10 0628) SpO2:  [95 %-99 %] 95 % (01/10 0628) Last BM Date: 07/30/12  Intake/Output from previous day: 01/09 0701 - 01/10 0700 In: 1227 [P.O.:480; I.V.:697; IV Piggyback:50] Out: 1700 [Urine:1700] Intake/Output this shift:    GI: soft, nontender. redness is unchanged to slightly lighter. no drainage. loose bm's  Lab Results:   Basename 07/30/12 1320  WBC 14.1*  HGB 11.1*  HCT 33.3*  PLT 406*   BMET  Basename 07/30/12 1320  NA 138  K 3.4*  CL 98  CO2 27  GLUCOSE 107*  BUN 7  CREATININE 0.51  CALCIUM 9.1   PT/INR No results found for this basename: LABPROT:2,INR:2 in the last 72 hours ABG No results found for this basename: PHART:2,PCO2:2,PO2:2,HCO3:2 in the last 72 hours  Studies/Results: No results found.  Anti-infectives: Anti-infectives     Start     Dose/Rate Route Frequency Ordered Stop   07/30/12 1400   piperacillin-tazobactam (ZOSYN) IVPB 3.375 g        3.375 g 12.5 mL/hr over 240 Minutes Intravenous 3 times per day 07/30/12 1300     07/23/12 0600   cefOXitin (MEFOXIN) 2 g in dextrose 5 % 50 mL IVPB        2 g 100 mL/hr over 30 Minutes Intravenous On call to O.R. 07/22/12 2009 07/23/12 1101          Assessment/Plan: s/p Procedure(s) (LRB) with comments: COLOSTOMY TAKEDOWN (N/A) - Primary Anastomosis DESTRUCTION LESION ANUS (N/A) - DESTRUCTION ANAL CONDYLOMA LYSIS OF ADHESION (N/A) EXPLORATORY LAPAROTOMY (N/A) Advance diet Continue IV Zosyn for another day or two before considering transition to oral ambulate  LOS: 8 days    TOTH III,PAUL S 07/31/2012

## 2012-08-01 LAB — CBC WITH DIFFERENTIAL/PLATELET
Basophils Absolute: 0 10*3/uL (ref 0.0–0.1)
Eosinophils Absolute: 0.2 10*3/uL (ref 0.0–0.7)
Lymphs Abs: 2.1 10*3/uL (ref 0.7–4.0)
MCH: 28.7 pg (ref 26.0–34.0)
MCHC: 32.7 g/dL (ref 30.0–36.0)
MCV: 87.8 fL (ref 78.0–100.0)
Monocytes Absolute: 1.6 10*3/uL — ABNORMAL HIGH (ref 0.1–1.0)
Neutrophils Relative %: 65 % (ref 43–77)
Platelets: 425 10*3/uL — ABNORMAL HIGH (ref 150–400)
RBC: 3.45 MIL/uL — ABNORMAL LOW (ref 4.22–5.81)
RDW: 13.4 % (ref 11.5–15.5)

## 2012-08-01 MED ORDER — HYDROMORPHONE HCL PF 1 MG/ML IJ SOLN
1.0000 mg | INTRAMUSCULAR | Status: DC | PRN
  Administered 2012-08-01: 1 mg via INTRAVENOUS
  Administered 2012-08-01: 2 mg via INTRAVENOUS
  Administered 2012-08-01: 1 mg via INTRAVENOUS
  Filled 2012-08-01 (×2): qty 1
  Filled 2012-08-01: qty 2

## 2012-08-01 MED ORDER — HYDROMORPHONE HCL PF 1 MG/ML IJ SOLN
1.0000 mg | INTRAMUSCULAR | Status: AC
  Administered 2012-08-01: 1 mg via INTRAVENOUS
  Filled 2012-08-01: qty 1

## 2012-08-01 NOTE — Progress Notes (Signed)
General surgery attending note:  I have personally evaluated and examined this patient this morning. I agree with the assessment and treatment plan outlined by  Clance Boll, PA.  The patient is doing well in general but has developed a wound infection. We opened up the skin and subcutaneous tissue in the upper two thirds of the incision. The colostomy site looks clean and doesn't need to be opened right now. The fascia is intact. His abdomen is otherwise soft and I do not suspect any intra-abdominal complication at this time.  Continue IV antibiotics. Begin twice a day wound care.   Angelia Mould. Derrell Lolling, M.D., [facility] General and Minimally invasive Surgery Breast and Colorectal Surgery Office:   [phone] Pager:   [phone]

## 2012-08-01 NOTE — Progress Notes (Signed)
Patient ID: Timothy [name], male   DOB: 21-May-1966, 47 y.o.   MRN: [id number] 9 Days Post-Op  Subjective: Pt reports a lot of tenderness around incision but denies n/v.  +BMs and some starting to be more formed  Objective: Vital signs in last 24 hours: Temp:  [97.9 F (36.6 C)-100 F (37.8 C)] 97.9 F (36.6 C) (01/11 0554) Pulse Rate:  [84-101] 84  (01/11 0554) Resp:  [17-18] 17  (01/11 0554) BP: (109-130)/(69-75) 109/69 mmHg (01/11 0554) SpO2:  [96 %-98 %] 97 % (01/11 0554) Last BM Date: 07/31/12  Intake/Output from previous day: 01/10 0701 - 01/11 0700 In: 1403.3 [P.O.:1080; I.V.:323.3] Out: 1506 [Urine:1500; Stool:6] Intake/Output this shift: Total I/O In: 165 [I.V.:165] Out: -   General: awake and alert GI: soft, tender, excessive purulent draining on ABDs and two are soaked, pt was in binder and this was not present last night, streaking erythema is noted around the superior pole  Procedure:  The staples from the superior wound were removed and 50cc+ of pururlent drainage was encountered, the tissue in the inferior pole was intact, the tissue was beefy red, there does not seem to be any fascia defect.  The wound is packed with wet to dry, patient tolerated well with 1mg  dilaudid IV  Lab Results:   Basename 08/01/12 0600 07/30/12 1320  WBC 11.1* 14.1*  HGB 9.9* 11.1*  HCT 30.3* 33.3*  PLT 425* 406*   BMET  Basename 07/30/12 1320  NA 138  K 3.4*  CL 98  CO2 27  GLUCOSE 107*  BUN 7  CREATININE 0.51  CALCIUM 9.1   PT/INR No results found for this basename: LABPROT:2,INR:2 in the last 72 hours ABG No results found for this basename: PHART:2,PCO2:2,PO2:2,HCO3:2 in the last 72 hours  Studies/Results: No results found.  Anti-infectives: Anti-infectives     Start     Dose/Rate Route Frequency Ordered Stop   07/30/12 1400   piperacillin-tazobactam (ZOSYN) IVPB 3.375 g        3.375 g 12.5 mL/hr over 240 Minutes Intravenous 3 times per day 07/30/12 1300     07/23/12 0600   cefOXitin (MEFOXIN) 2 g in dextrose 5 % 50 mL IVPB        2 g 100 mL/hr over 30 Minutes Intravenous On call to O.R. 07/22/12 2009 07/23/12 1101          Assessment/Plan: 1. POD#9-colostomy takedown, LOA, destruction anal condyloma: wound infection without fascia defect, start packing wound twice daily and prn with wet to dry, keep on zosyn for now, diet as tolerated  --wet to dry bid and prn  --zosyn IV  --keep close eye on ostomy closure site and inferior pole of incision  --OOB and ambulate  LOS: 9 days    WHITE, ELIZABETH 08/01/2012

## 2012-08-02 NOTE — Progress Notes (Signed)
In to restart PIV per routine.  Pt states for d/c to home tomorrow and refuses restart. Site CDI without signs of redness or infection.  Pt denies pain or tenderness. Will notify RN.

## 2012-08-02 NOTE — Progress Notes (Signed)
General surgery attending note:  I personally interviewed and examined this patient this morning. I agree with the assessment  and treatment plan outlined by Clance Boll, PA.  If his wound stabilizes he will probably be able to be discharged tomorrow on Augmentin. His wife is going to come in and learn wound care today He is otherwise doing very well and in good spirits. He ate all of his breakfast.  Angelia Mould. Derrell Lolling, M.D., [facility] General and Minimally invasive Surgery Breast and Colorectal Surgery Office:   [phone] Pager:   [phone]

## 2012-08-02 NOTE — Progress Notes (Signed)
Patient ID: Timothy [name], male   DOB: October 03, 1965, 47 y.o.   MRN: [id number] 10 Days Post-Op  Subjective: Pt reports pain and pressure in abd much better, tolerating dressing changes well, denies n/v, +BMs and some starting to be more formed  Objective: Vital signs in last 24 hours: Temp:  [98.1 F (36.7 C)-98.8 F (37.1 C)] 98.1 F (36.7 C) (01/12 0519) Pulse Rate:  [86-93] 86  (01/12 0519) Resp:  [18-19] 18  (01/12 0519) BP: (121-130)/(68-84) 123/71 mmHg (01/12 0519) SpO2:  [95 %-98 %] 98 % (01/12 0519) Last BM Date: 08/01/12  Intake/Output from previous day: 01/11 0701 - 01/12 0700 In: 1360 [P.O.:750; I.V.:610] Out: 1700 [Urine:1700] Intake/Output this shift:    General: awake and alert GI: soft, min tenderness today, erythema almost resolved now, wound healthy with beefy red tissue in base, +bs, inferior pole intact without erythema, no erythema around ostomy closure    Lab Results:   Basename 08/01/12 0600 07/30/12 1320  WBC 11.1* 14.1*  HGB 9.9* 11.1*  HCT 30.3* 33.3*  PLT 425* 406*   BMET  Basename 07/30/12 1320  NA 138  K 3.4*  CL 98  CO2 27  GLUCOSE 107*  BUN 7  CREATININE 0.51  CALCIUM 9.1   PT/INR No results found for this basename: LABPROT:2,INR:2 in the last 72 hours ABG No results found for this basename: PHART:2,PCO2:2,PO2:2,HCO3:2 in the last 72 hours  Studies/Results: No results found.  Anti-infectives: Anti-infectives     Start     Dose/Rate Route Frequency Ordered Stop   07/30/12 1400   piperacillin-tazobactam (ZOSYN) IVPB 3.375 g        3.375 g 12.5 mL/hr over 240 Minutes Intravenous 3 times per day 07/30/12 1300     07/23/12 0600   cefOXitin (MEFOXIN) 2 g in dextrose 5 % 50 mL IVPB        2 g 100 mL/hr over 30 Minutes Intravenous On call to O.R. 07/22/12 2009 07/23/12 1101          Assessment/Plan: 1. POD#10-colostomy takedown, LOA, destruction anal condyloma: wound infection s/p bedside opening and wound packing, much  better today  --wet to dry bid and prn  --zosyn IV  --likely home tomorrow  --no home health should be needed as patient's wife has done dressing and wound care for the patient in the past.  --OOB and ambulate  LOS: 10 days    Kendrick Haapala 08/02/2012

## 2012-08-03 NOTE — Care Management Note (Signed)
  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 Start clears  Decrease ivf  ambulate     Action/Plan:   08-01-12 opened up the skin and subcutaneous tissue in the upper two thirds of the incision   Anticipated DC Date:  08/04/2012   Anticipated DC Plan:  HOME W HOME HEALTH SERVICES         Choice offered to / List presented to:  C-1 Patient        HH arranged  HH-1 RN      [facility] agency  [facility]   Status of service:  Completed, signed off Medicare Important Message given?   (If response is "NO", the following Medicare IM given date fields will be blank) Date Medicare IM given:   Date Additional Medicare IM given:    Discharge Disposition:    Per UR Regulation:  Reviewed for med. necessity/level of care/duration of stay  If discussed at Long Length of Stay Meetings, dates discussed:    Comments:

## 2012-08-03 NOTE — Progress Notes (Signed)
11 Days Post-Op  Subjective: No complaints. Wound opened over weekend  Objective: Vital signs in last 24 hours: Temp:  [97.9 F (36.6 C)-98.1 F (36.7 C)] 97.9 F (36.6 C) (01/13 0530) Pulse Rate:  [84-92] 84  (01/12 2310) Resp:  [19-20] 20  (01/13 0530) BP: (115-133)/(69-80) 117/80 mmHg (01/13 0530) SpO2:  [97 %-98 %] 98 % (01/13 0530) Last BM Date: 08/02/12  Intake/Output from previous day: 01/12 0701 - 01/13 0700 In: 1220 [P.O.:960; I.V.:210; IV Piggyback:50] Out: 3300 [Urine:3300] Intake/Output this shift:    GI: soft, nontender. open wound clean. good bm's  Lab Results:   Basename 08/01/12 0600  WBC 11.1*  HGB 9.9*  HCT 30.3*  PLT 425*   BMET No results found for this basename: NA:2,K:2,CL:2,CO2:2,GLUCOSE:2,BUN:2,CREATININE:2,CALCIUM:2 in the last 72 hours PT/INR No results found for this basename: LABPROT:2,INR:2 in the last 72 hours ABG No results found for this basename: PHART:2,PCO2:2,PO2:2,HCO3:2 in the last 72 hours  Studies/Results: No results found.  Anti-infectives: Anti-infectives     Start     Dose/Rate Route Frequency Ordered Stop   07/30/12 1400   piperacillin-tazobactam (ZOSYN) IVPB 3.375 g        3.375 g 12.5 mL/hr over 240 Minutes Intravenous 3 times per day 07/30/12 1300     07/23/12 0600   cefOXitin (MEFOXIN) 2 g in dextrose 5 % 50 mL IVPB        2 g 100 mL/hr over 30 Minutes Intravenous On call to O.R. 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

## 2012-08-04 MED ORDER — OXYCODONE-ACETAMINOPHEN 5-325 MG PO TABS: 1.0000 | ORAL_TABLET | ORAL | Status: DC | PRN

## 2012-08-04 MED ORDER — AMOXICILLIN-POT CLAVULANATE 875-125 MG PO TABS: 1.0000 | ORAL_TABLET | Freq: Two times a day (BID) | ORAL | Status: DC

## 2012-08-04 MED ORDER — DIBUCAINE 1 % RE OINT: 1.0000 "application " | TOPICAL_OINTMENT | Freq: Three times a day (TID) | RECTAL | Status: DC

## 2012-08-04 NOTE — Progress Notes (Signed)
Patient discharged to home in care of spouse. Medications and instructions reviewed with patient and spouse with all questions answered. Staples removed and steri strips placed. IV d/c'd with cath intact and dressing CDI. Midline dressing changed by wife. Patient is to follow up with Dr. Carolynne Edouard on 1.23.14. Patient will have [facility] RN to assist with dressing changes.

## 2012-08-04 NOTE — Progress Notes (Signed)
Patient ID: Timothy [name], male   DOB: April 04, 1966, 47 y.o.   MRN: [id number]   LOS: 12 days   Subjective: Doing well, ready to go home.  Objective: Vital signs in last 24 hours: Temp:  [97.7 F (36.5 C)-99.3 F (37.4 C)] 98.1 F (36.7 C) (01/14 0517) Pulse Rate:  [84-109] 84  (01/14 0517) Resp:  [16-18] 18  (01/14 0517) BP: (115-130)/(67-76) 115/68 mmHg (01/14 0517) SpO2:  [94 %-96 %] 96 % (01/14 0517) Last BM Date: 08/03/12   General appearance: alert and no distress Resp: clear to auscultation bilaterally Cardio: regular rate and rhythm GI: Soft, +BS. Incision C/D/I except superior dehiscence which is clean. Perianal lesion healing well.   Assessment/Plan: 1. POD#12-colostomy takedown, LOA, destruction anal condyloma: wound infection s/p bedside opening and wound packing. Home today    Freeman Caldron, PA-C Pager: [phone]   08/04/2012

## 2012-08-05 NOTE — Discharge Summary (Signed)
Physician Discharge Summary  Patient ID: Timothy [name] MRN: [id number] DOB/AGE: 01/25/1966 47 y.o.  Admit date: 07/23/2012 Discharge date: 08/05/2012  Admission Diagnoses:  Discharge Diagnoses:  Active Problems:  * No active hospital problems. *    Discharged Condition: good  Hospital Course: the pt was brought to the hospital and underwent a colostomy takedown and destruction of anal condyloma. His postoperative course was notable for a persistent ileus which gradually resolved. He also was noted to have a superficial wound infection that required opening the top portion of his wound. The wound cleaned up well and we were able to slowly advance his diet until he was ready for discharge home.  Consults: None  Significant Diagnostic Studies: none  Treatments: surgery: as above  Discharge Exam: Blood pressure 115/68, pulse 84, temperature 98.1 F (36.7 C), temperature source Oral, resp. rate 18, height 5\' 10"  (1.778 m), weight 190 lb 11.2 oz (86.501 kg), SpO2 96.00%. GI: soft, nontender. wound clean and lower portion intact  Disposition: 01-Home or Self Care  Discharge Orders    Future Appointments: Provider: Department: Dept Phone: Center:   08/13/2012 9:50 AM Robyne Askew, MD [facility], [place] [phone] None   08/21/2012 9:30 AM Garnetta Buddy, MD [facility] [phone] [facility]       Medication List     As of 08/05/2012  9:07 AM    STOP taking these medications         acetaminophen 500 MG tablet   Commonly known as: TYLENOL      HYDROcodone-acetaminophen 5-325 MG per tablet   Commonly known as: NORCO/VICODIN      TAKE these medications         amoxicillin-clavulanate 875-125 MG per tablet   Commonly known as: AUGMENTIN   Take 1 tablet by mouth 2 (two) times daily.      diazepam 5 MG tablet   Commonly known as: VALIUM   Take 5 mg by mouth every 12 (twelve) hours as needed. For anxiety/sleep      dibucaine 1 % Oint   Commonly known as: NUPERCAINAL   Place 1 application rectally 3 (three) times daily.      diphenhydrAMINE 25 MG tablet   Commonly known as: BENADRYL   Take 25 mg by mouth every 6 (six) hours as needed. For itching      escitalopram 10 MG tablet   Commonly known as: LEXAPRO   Take 2 tablets (20 mg total) by mouth daily.      ibuprofen 800 MG tablet   Commonly known as: ADVIL,MOTRIN   Take 800 mg by mouth every 8 (eight) hours as needed. For pain      OVER THE COUNTER MEDICATION   Take 2 tablets by mouth daily. Medication:Thymunose OTC      oxyCODONE-acetaminophen 5-325 MG per tablet   Commonly known as: PERCOCET/ROXICET   Take 1-2 tablets by mouth every 4 (four) hours as needed for pain.      polyethylene glycol packet   Commonly known as: MIRALAX / GLYCOLAX   Take 17 g by mouth daily.           Follow-up Information    Follow up with Robyne Askew, MD. On 08/13/2012. (9:50AM)    Contact information:   [address] [place] [address] [phone]          Signed: Robyne Askew 08/05/2012, 9:07 AM

## 2012-08-10 ENCOUNTER — Telehealth (INDEPENDENT_AMBULATORY_CARE_PROVIDER_SITE_OTHER): Payer: Self-pay | Admitting: General Surgery

## 2012-08-10 NOTE — Telephone Encounter (Signed)
Pt's wife called in to let us know that her husband is having chills, low grade fever (99.3), and nausea s/p colostomy takedown on 07/23/12.  She wanted to verify that he is okay.  He has an appt w/ Dr. Carolynne Edouard on 08/13/12.  She states he is having normal BM's and no other problems.  Informed her that this is probably just post operative problems but because Dr. Carolynne Edouard is out of the office today, I would let our urgent office doctor review this to make sure.  Informed her that Marcelino Duster, would get back in touch with them to verify things.

## 2012-08-10 NOTE — Telephone Encounter (Signed)
I called Timothy [name] back to let her know Dr Lindie Spruce said he thinks it is okay to wait until 1/23 to be seen. If fever reaches 101, pt has trouble having BM or symptoms seem to worsen, call back and we will have him be seen sooner.

## 2012-08-13 ENCOUNTER — Ambulatory Visit (INDEPENDENT_AMBULATORY_CARE_PROVIDER_SITE_OTHER): Payer: PRIVATE HEALTH INSURANCE | Admitting: General Surgery

## 2012-08-13 VITALS — BP 132/78 | Temp 97.9°F | Resp 16 | Ht 70.0 in | Wt 162.6 lb

## 2012-08-13 DIAGNOSIS — T8140XA Infection following a procedure, unspecified, initial encounter: Secondary | ICD-10-CM

## 2012-08-13 DIAGNOSIS — T8149XA Infection following a procedure, other surgical site, initial encounter: Secondary | ICD-10-CM | POA: Insufficient documentation

## 2012-08-13 DIAGNOSIS — Z933 Colostomy status: Secondary | ICD-10-CM

## 2012-08-13 NOTE — Patient Instructions (Signed)
Use carnation instant breakfast or other protein supplement 2-3 times a day Stop smoking walk

## 2012-08-18 ENCOUNTER — Telehealth (INDEPENDENT_AMBULATORY_CARE_PROVIDER_SITE_OTHER): Payer: Self-pay

## 2012-08-18 NOTE — Telephone Encounter (Signed)
Patient wife  Called to say daughter has flu symptoms and asked what they should do. I advised to keep [place] and daughter away from each other as much as possible. To disinfect and keep germ spreading down as much as possible is the best prevention at this point.

## 2012-08-20 ENCOUNTER — Telehealth (INDEPENDENT_AMBULATORY_CARE_PROVIDER_SITE_OTHER): Payer: Self-pay | Admitting: General Surgery

## 2012-08-20 ENCOUNTER — Encounter (HOSPITAL_COMMUNITY): Payer: Self-pay | Admitting: Emergency Medicine

## 2012-08-20 ENCOUNTER — Emergency Department (HOSPITAL_COMMUNITY)
Admission: EM | Admit: 2012-08-20 | Discharge: 2012-08-21 | Disposition: A | Discharge status: Home / Self Care | Payer: Medicaid Other | Attending: Emergency Medicine | Admitting: Emergency Medicine

## 2012-08-20 DIAGNOSIS — F17200 Nicotine dependence, unspecified, uncomplicated: Secondary | ICD-10-CM | POA: Insufficient documentation

## 2012-08-20 DIAGNOSIS — F329 Major depressive disorder, single episode, unspecified: Secondary | ICD-10-CM | POA: Insufficient documentation

## 2012-08-20 DIAGNOSIS — R51 Headache: Secondary | ICD-10-CM

## 2012-08-20 DIAGNOSIS — F3289 Other specified depressive episodes: Secondary | ICD-10-CM | POA: Insufficient documentation

## 2012-08-20 DIAGNOSIS — Z8719 Personal history of other diseases of the digestive system: Secondary | ICD-10-CM | POA: Insufficient documentation

## 2012-08-20 DIAGNOSIS — Z8709 Personal history of other diseases of the respiratory system: Secondary | ICD-10-CM | POA: Insufficient documentation

## 2012-08-20 DIAGNOSIS — G43909 Migraine, unspecified, not intractable, without status migrainosus: Secondary | ICD-10-CM | POA: Insufficient documentation

## 2012-08-20 DIAGNOSIS — Z79899 Other long term (current) drug therapy: Secondary | ICD-10-CM | POA: Insufficient documentation

## 2012-08-20 DIAGNOSIS — Z8679 Personal history of other diseases of the circulatory system: Secondary | ICD-10-CM | POA: Insufficient documentation

## 2012-08-20 DIAGNOSIS — G4730 Sleep apnea, unspecified: Secondary | ICD-10-CM | POA: Insufficient documentation

## 2012-08-20 DIAGNOSIS — Z8669 Personal history of other diseases of the nervous system and sense organs: Secondary | ICD-10-CM | POA: Insufficient documentation

## 2012-08-20 DIAGNOSIS — F411 Generalized anxiety disorder: Secondary | ICD-10-CM | POA: Insufficient documentation

## 2012-08-20 DIAGNOSIS — R112 Nausea with vomiting, unspecified: Secondary | ICD-10-CM | POA: Insufficient documentation

## 2012-08-20 DIAGNOSIS — Z862 Personal history of diseases of the blood and blood-forming organs and certain disorders involving the immune mechanism: Secondary | ICD-10-CM | POA: Insufficient documentation

## 2012-08-20 DIAGNOSIS — Z933 Colostomy status: Secondary | ICD-10-CM | POA: Insufficient documentation

## 2012-08-20 DIAGNOSIS — Z872 Personal history of diseases of the skin and subcutaneous tissue: Secondary | ICD-10-CM | POA: Insufficient documentation

## 2012-08-20 NOTE — Telephone Encounter (Signed)
[name] called to report that Timothy [name] has a left side migraine, started last night with increased nausea. He does have mediation for nausea/ He has not had migraines before per pt. Pain medication not helping/ I paged Dr. Carolynne Edouard but he is unavailable/ s/p colostomy takedown/gy

## 2012-08-20 NOTE — ED Notes (Signed)
Pt c/o h/a. Pt states that he has abd wound and being seen by md. Pt has never had h/a like this and thinks it is a migraine. Pt called md today and d/t just having surgery for a reversal of colostomy that go infected md and md wants pt to have a CT of head. MD stated could be blood clot per pt and pt's family

## 2012-08-20 NOTE — Telephone Encounter (Signed)
Dr. Carolynne Edouard returned page and ordered Phenergan 12.5mg  po q 8hrs #5 only/ I notified [name] and told her I would call rx to [facility]/ [phone]/ She will office call if symptoms do not improve/gy.

## 2012-08-20 NOTE — ED Notes (Signed)
Pt states he woke this morning with a migraine headache  Pt states the pain is on the left side of his head and it is causing him to have nausea and vomiting  Pt has taken phenergan 12.5mg  without relief  Pt states he has also taken pain medication and diazapam without relief

## 2012-08-21 ENCOUNTER — Telehealth (INDEPENDENT_AMBULATORY_CARE_PROVIDER_SITE_OTHER): Payer: Self-pay

## 2012-08-21 ENCOUNTER — Encounter: Payer: Self-pay | Admitting: Family Medicine

## 2012-08-21 ENCOUNTER — Ambulatory Visit (INDEPENDENT_AMBULATORY_CARE_PROVIDER_SITE_OTHER): Payer: Medicaid Other | Admitting: Family Medicine

## 2012-08-21 ENCOUNTER — Emergency Department (HOSPITAL_COMMUNITY): Payer: Medicaid Other

## 2012-08-21 VITALS — BP 129/83 | HR 83 | Ht 70.0 in | Wt 161.6 lb

## 2012-08-21 DIAGNOSIS — G43909 Migraine, unspecified, not intractable, without status migrainosus: Secondary | ICD-10-CM

## 2012-08-21 DIAGNOSIS — F419 Anxiety disorder, unspecified: Secondary | ICD-10-CM

## 2012-08-21 DIAGNOSIS — F410 Panic disorder [episodic paroxysmal anxiety] without agoraphobia: Secondary | ICD-10-CM

## 2012-08-21 DIAGNOSIS — F411 Generalized anxiety disorder: Secondary | ICD-10-CM

## 2012-08-21 DIAGNOSIS — F329 Major depressive disorder, single episode, unspecified: Secondary | ICD-10-CM

## 2012-08-21 LAB — BASIC METABOLIC PANEL
BUN: 7 mg/dL (ref 6–23)
Chloride: 101 mEq/L (ref 96–112)
GFR calc Af Amer: >90 mL/min (ref >90)
Glucose, Bld: 101 mg/dL — ABNORMAL HIGH (ref 70–99)
Potassium: 3.7 mEq/L (ref 3.5–5.1)
Sodium: 139 mEq/L (ref 135–145)

## 2012-08-21 LAB — CBC WITH DIFFERENTIAL/PLATELET
Basophils Relative: 0 % (ref 0–1)
Hemoglobin: 10.5 g/dL — ABNORMAL LOW (ref 13.0–17.0)
Lymphs Abs: 1.1 10*3/uL (ref 0.7–4.0)
Monocytes Relative: 4 % (ref 3–12)
Neutro Abs: 12.2 10*3/uL — ABNORMAL HIGH (ref 1.7–7.7)
Neutrophils Relative %: 87 % — ABNORMAL HIGH (ref 43–77)
RBC: 3.75 MIL/uL — ABNORMAL LOW (ref 4.22–5.81)

## 2012-08-21 MED ORDER — DIAZEPAM 5 MG PO TABS: 5.0000 mg | ORAL_TABLET | Freq: Two times a day (BID) | ORAL | Status: DC | PRN

## 2012-08-21 MED ORDER — METOCLOPRAMIDE HCL 10 MG PO TABS: 10.0000 mg | ORAL_TABLET | Freq: Four times a day (QID) | ORAL | Status: DC

## 2012-08-21 MED ORDER — SODIUM CHLORIDE 0.9 % IV SOLN
Freq: Once | INTRAVENOUS | Status: AC
  Administered 2012-08-21: 01:00:00 via INTRAVENOUS

## 2012-08-21 MED ORDER — FLUOXETINE HCL 40 MG PO CAPS: 40.0000 mg | ORAL_CAPSULE | Freq: Every day | ORAL | Status: DC

## 2012-08-21 MED ORDER — HYDROMORPHONE HCL PF 1 MG/ML IJ SOLN
1.0000 mg | Freq: Once | INTRAMUSCULAR | Status: AC
  Administered 2012-08-21: 1 mg via INTRAVENOUS
  Filled 2012-08-21: qty 1

## 2012-08-21 MED ORDER — METOCLOPRAMIDE HCL 5 MG/ML IJ SOLN
10.0000 mg | Freq: Once | INTRAMUSCULAR | Status: AC
  Administered 2012-08-21: 10 mg via INTRAVENOUS
  Filled 2012-08-21: qty 2

## 2012-08-21 NOTE — ED Provider Notes (Signed)
History     CSN: [id number]  Arrival date & time 08/20/12  2308   None     Chief Complaint  Patient presents with  . Migraine    (Consider location/radiation/quality/duration/timing/severity/associated sxs/prior treatment) HPI Comments: PAtient is 20 days Post operative for a colostomy take down now with L frontal headache, nausea and vomiting   Has been taking Zofran until he ran out called DR. Carolynne Edouard who prescribed Phenergan which is controlling the nausea but when he wakes still has the headache.  Does not have a Hx of headache When he called back to the Surgery was advised to come to the ED for a head CT scan and treatment   Patient is a 47 y.o. male presenting with migraines. The history is provided by the patient.  Migraine This is a new problem. The current episode started yesterday. The problem has been unchanged. Associated symptoms include headaches, nausea and vomiting. Pertinent negatives include no abdominal pain, change in bowel habit, chest pain, chills, congestion, coughing, fever, neck pain, rash, sore throat, visual change or weakness. Nothing aggravates the symptoms. He has tried oral narcotics and lying down for the symptoms. The treatment provided no relief.    Past Medical History  Diagnosis Date  . Hypoglycemia   . GERD (gastroesophageal reflux disease)   . Diverticulosis     By colonoscopy June 2005  . Lower GI bleed     June 2005. Presumably secondary to diverticulosis.  . Anemia   . Anxiety   . Alcohol abuse   . Hemorrhoids   . Family history of anesthesia complication     Mother N/V  . Bronchitis     history  . Cellulitis     - left knee-2009  . Glaucoma syndrome     does not use eye drops, "They burn".  . Sleep apnea     witness apnea by wife.  . Elevated CK 3/13  . Depression   . Incisional hernia   . Prinzmetal angina   . History of stomach ulcers 2005  . Headache     "alot; not daily" (07/23/2012)  . Migraines     "not often" (07/23/2012)     Past Surgical History  Procedure Date  . Appendectomy 1982  . Elbow fracture surgery ~ 1975    left;  pins inserted   . Wisdom tooth extraction ~ 1983  . Laparoscopic left colon resection 02/19/2012    SIGMOID  . Wart fulguration 02/19/2012    Procedure: FULGURATION ANAL WART;  Surgeon: Robyne Askew, MD;  Location: [facility] OR;  Service: General;  Laterality: N/A;  Destroy  Anal Condyloma   . Laparotomy 02/27/2012    Procedure: EXPLORATORY LAPAROTOMY;  Surgeon: Robyne Askew, MD;  Location: [facility] OR;  Service: General;  Laterality: N/A;  . Colostomy 02/27/2012    Procedure: COLOSTOMY;  Surgeon: Robyne Askew, MD;  Location: [facility] OR;  Service: General;  Laterality: N/A;  . Colon resection 02/27/2012    Procedure: COLON RESECTION;  Surgeon: Robyne Askew, MD;  Location: [facility] OR;  Service: General;  Laterality: N/A;  . Colostomy takedown 07/23/2012  . Abdominal exploration surgery 07/23/2012    w/LOA (07/23/2012)  . Colostomy takedown 07/23/2012    Procedure: COLOSTOMY TAKEDOWN;  Surgeon: Robyne Askew, MD;  Location: [facility] OR;  Service: General;  Laterality: N/A;  Primary Anastomosis  . Lesion destruction 07/23/2012    Procedure: DESTRUCTION LESION ANUS;  Surgeon: Caleen Essex III,  MD;  Location: [facility] OR;  Service: General;  Laterality: N/A;  DESTRUCTION ANAL CONDYLOMA  . Lysis of adhesion 07/23/2012    Procedure: LYSIS OF ADHESION;  Surgeon: Robyne Askew, MD;  Location: [facility] OR;  Service: General;  Laterality: N/A;  . Laparotomy 07/23/2012    Procedure: EXPLORATORY LAPAROTOMY;  Surgeon: Robyne Askew, MD;  Location: [facility] OR;  Service: General;  Laterality: N/A;    Family History  Problem Relation Age of Onset  . Diabetes Mother   . Parkinsonism Mother   . Depression Mother   . Alcohol abuse Father   . Hypertension Father   . Breast cancer Maternal Aunt   . Stroke Neg Hx   . Heart disease Neg Hx   . Anxiety disorder Father   . Ulcers Father   . Colon polyps Father   . Diabetes Mother   . Diabetes  Maternal Grandmother     History  Substance Use Topics  . Smoking status: Current Every Day Smoker -- 1.5 packs/day for 27 years    Types: Cigarettes  . Smokeless tobacco: Former User    Types: Chew     Comment: 07/23/2012 "raely uses dip"  . Alcohol Use: No     Comment: h/o 24 PK/ WEEKEND; 07/23/2012 "sober since 02/12/2013"      Review of Systems  Constitutional: Negative for fever and chills.  HENT: Negative for ear pain, congestion, sore throat, rhinorrhea and neck pain.   Eyes: Negative for photophobia.  Respiratory: Negative for cough.   Cardiovascular: Negative for chest pain.  Gastrointestinal: Positive for nausea and vomiting. Negative for abdominal pain and change in bowel habit.  Skin: Negative for rash.  Neurological: Positive for headaches. Negative for dizziness and weakness.    Allergies  Ambien; Darvocet; and Morphine and related  Home Medications   Current Outpatient Rx  Name  Route  Sig  Dispense  Refill  . DIAZEPAM 5 MG PO TABS   Oral   Take 5 mg by mouth every 12 (twelve) hours as needed. For anxiety/sleep         . DIPHENHYDRAMINE HCL 25 MG PO TABS   Oral   Take 25 mg by mouth every 6 (six) hours as needed. For itching         . IBUPROFEN 800 MG PO TABS   Oral   Take 800 mg by mouth every 8 (eight) hours as needed. For pain         . OXYCODONE-ACETAMINOPHEN 5-325 MG PO TABS   Oral   Take 1-2 tablets by mouth every 4 (four) hours as needed for pain.   60 tablet   0   . POLYETHYLENE GLYCOL 3350 PO PACK   Oral   Take 17 g by mouth daily.         Marland Kitchen PROMETHAZINE HCL 25 MG PO TABS   Oral   Take 25 mg by mouth every 6 (six) hours as needed. For nausea         . ESCITALOPRAM OXALATE 10 MG PO TABS   Oral   Take 2 tablets (20 mg total) by mouth daily.   60 tablet   5   . OVER THE COUNTER MEDICATION   Oral   Take 2 tablets by mouth daily. Medication:Thymunose OTC         . PROMETHAZINE HCL 25 MG PO TABS   Oral   Take 1 tablet  (25 mg total) by mouth every 6 (six) hours as  needed for nausea.   15 tablet   0     BP 139/80  Pulse 99  Temp 97.8 F (36.6 C) (Oral)  Resp 16  SpO2 98%  Physical Exam  Constitutional: He is oriented to person, place, and time. He appears well-developed and well-nourished.  HENT:  Head: Normocephalic.  Right Ear: External ear normal.  Left Ear: External ear normal.  Mouth/Throat: Oropharynx is clear and moist.  Eyes: Pupils are equal, round, and reactive to light.  Neck: Normal range of motion. Neck supple. No spinous process tenderness and no muscular tenderness present.  Cardiovascular: Normal rate.   Pulmonary/Chest: Effort normal.  Abdominal: Soft. Bowel sounds are normal. He exhibits no distension. There is tenderness.       Healing abdominal surgical wound healing by secondary intention   Musculoskeletal: Normal range of motion.  Neurological: He is alert and oriented to person, place, and time. No cranial nerve deficit.  Skin: Skin is warm. No rash noted. No pallor.    ED Course  Procedures (including critical care time)   Labs Reviewed  CBC WITH DIFFERENTIAL   No results found.   No diagnosis found.    MDM  Temporal area not tender to touch, putting temporal arteritis low on the differential  No longer having any pain and nausea has resolved.  Will DC home with Rx for Reglan     Arman Filter, NP 08/21/12 1610  Arman Filter, NP 08/21/12 [phone]

## 2012-08-21 NOTE — Telephone Encounter (Signed)
Pt calling to give update. Pt evaluated by Dr [name] for dx of migraine. He gave medications to treat this. Pt states vomiting has resolved and headache is almost gone. Pt questioned if he needs to f/u sooner than  2-24 with Dr Carolynne Edouard. Pt states his abd wd and bowel function is doing well. Pt advised I will send this question to Dr Carolynne Edouard and Marcelino Duster his MA. Pt can be reached at [phone]. Pt to call with concerns and continue f/u with Dr [name] for migraine treatment.

## 2012-08-21 NOTE — Patient Instructions (Signed)
Stop taking the Lexapro. Please start taking Prozac 40 mg daily. Also continue the diazepam twice daily as needed.   Please follow up in 4 weeks.   Sincerely,   Dr. [name]

## 2012-08-21 NOTE — Progress Notes (Signed)
  Subjective:    Patient ID: Timothy [name], male    DOB: Jul 01, 1966, 47 y.o.   MRN: [id number]  HPI  47 year old M who recently underwent colostomy takedown after partial colectomy for recurrent diverticulitis. He presents today for follow up of depression, anxiety and headaches. His wife Timothy [name] accompanies him.   Headaches - Migraine headache x 1 yesterday. Associated with severe nausea and vomiting, photophobia, left sided pain and lacrimation. He talked to the general surgeon on call who told him to present to the ED yesterday. An CT scan of his head was normal, and the patient was given Reglan for nausea. He headaches resolved and is not present today, but still with mild photophobia. Wife thinks may be triggered by caffeine withdrawal as patient previously drank 4 cups of coffee and 8 diet mountain dews daily.   Depression - According to his wife, it is worse than before. He is going days without getting out of bed and is very negative. His wife believes that this is related to not being able to provide for the family and having the still healing abdominal wound from the surgery on 07/23/12. Currently taking Lexapro 20 mg daily. Patient also has decreased appetite and weight loss believed to be related to depression. Still not drinking alcohol since July 2013.   Anxiety - Present daily. Not subjectively improved. Still talking diazepam 5 mg BID.   Review of Systems See HPI    Objective:   Physical Exam  BP 129/83  Pulse 83  Ht 5\' 10"  (1.778 m)  Wt 161 lb 9.6 oz (73.301 kg)  BMI 23.19 kg/m2 Gen: middle aged M, pale and thinner than previous visit Psych: flat affect, no SI/HI, normal thought content and speech pattern     GAD 7 = 21 (maxiumum score) PHQ 9 = 22 (severe)  Assessment & Plan:  Primary headache and poorly controlled anxiety and depression. > 25 minutes spent face to face with patient and wife.

## 2012-08-22 NOTE — ED Provider Notes (Signed)
History/physical exam/procedure(s) were performed by non-physician practitioner and as supervising physician I was immediately available for consultation/collaboration. I have reviewed all notes and am in agreement with care and plan.   Ramell Wacha S Ulla Mckiernan, MD 08/22/12 1759 

## 2012-08-23 ENCOUNTER — Telehealth (INDEPENDENT_AMBULATORY_CARE_PROVIDER_SITE_OTHER): Payer: Self-pay | Admitting: General Surgery

## 2012-08-23 DIAGNOSIS — G43909 Migraine, unspecified, not intractable, without status migrainosus: Secondary | ICD-10-CM

## 2012-08-23 HISTORY — DX: Migraine, unspecified, not intractable, without status migrainosus: G43.909

## 2012-08-23 NOTE — Telephone Encounter (Signed)
He is 1 month s/p colostomy takedown and called for refill of pain meds thinking that he "overdid things" while working around the house.  He is having abdominal pain but bowels still okay and no fevers or chills.  I called in some hydrocodone #10 to [facility] in [place].  I explained that if he would like additional pain meds then he will need to be evaluated in the office or in the ER.

## 2012-08-23 NOTE — Assessment & Plan Note (Addendum)
Still related to general health condition and financial stress. Stop Lexapro and start Fluoxetine at 40 mg daily. Continue diazepam. Patient to see Dr. Pascal Lux.

## 2012-08-23 NOTE — Assessment & Plan Note (Signed)
Likely triggered by caffeine withdrawal. Patient to use ibuprofen 800 mg and reglan PRN for severe headache. Instructed not to use ibuprofen daily given GI issues. Consider triptan as needed in future.

## 2012-08-23 NOTE — Assessment & Plan Note (Addendum)
Stop Lexapro and start Fluoxetine at 40 mg daily. Continue diazepam. Patient to see Dr. Pascal Lux.

## 2012-08-24 ENCOUNTER — Telehealth (INDEPENDENT_AMBULATORY_CARE_PROVIDER_SITE_OTHER): Payer: Self-pay

## 2012-08-24 NOTE — Telephone Encounter (Signed)
Per Dr Billey Chang order Norco 5/325 #40 1 po q 4-6h for pain with one refill called to [facility]  MD line  [phone]. Pt notified.

## 2012-08-24 NOTE — Telephone Encounter (Signed)
Pt called stating he spoke with MD on call [ Dr Biagio Quint for pain med yesterday. Pt states he is more active and having more soreness of abd wall. No redness. No fever. Pt was given norco #10 yesterday. He state he was advised by Dr Biagio Quint he would have to call today to speak with Dr Carolynne Edouard for more pain med. I advised pt I will send request to Dr Carolynne Edouard and his assistant for review. Pt states he is using [facility] [id number]. Pt can be reached at [phone].

## 2012-08-25 ENCOUNTER — Telehealth: Payer: Self-pay | Admitting: Psychology

## 2012-08-25 NOTE — Telephone Encounter (Signed)
Patient left VM stating that he was post surgery and ready to schedule an appointment.  I called him back and left a VM.

## 2012-08-26 NOTE — Telephone Encounter (Signed)
Scheduled for 2/20 at 10:00.    I told him the following: -  If he is unable to make the appointment he needs to call me. -  If he misses the appointment without a phone call, I won't be able to schedule him back in my clinic. He voiced an understanding and was able to repeat the appointment date and time back to me.

## 2012-08-28 ENCOUNTER — Telehealth (INDEPENDENT_AMBULATORY_CARE_PROVIDER_SITE_OTHER): Payer: Self-pay

## 2012-08-28 ENCOUNTER — Telehealth (INDEPENDENT_AMBULATORY_CARE_PROVIDER_SITE_OTHER): Payer: Self-pay | Admitting: General Surgery

## 2012-08-28 NOTE — Telephone Encounter (Signed)
Message copied by Brennan Bailey on Fri Aug 28, 2012  4:12 PM ------      Message from: Marin Shutter      Created: Tue Aug 25, 2012  9:53 AM      Regarding: Dr. Carolynne Edouard      Contact: [phone]       Pt got called for jury duty 3/3. He would like a Dr's note to excuse him from jury duty. Please call him to let him know when his note will be ready.  Thx      [phone]

## 2012-08-28 NOTE — Telephone Encounter (Signed)
Patient returned call based on message left earlier by [facility]. Patient advised no note to be issued to excuse from jury duty. Date of 09/21/12 is far enough out post op. Patient understood.

## 2012-08-28 NOTE — Telephone Encounter (Signed)
LMOM to call back. Per Dr Carolynne Edouard he thinks patient will be far enough out from surgery to go to jury duty on 3/3. No note written.

## 2012-09-02 ENCOUNTER — Telehealth: Payer: Self-pay | Admitting: Family Medicine

## 2012-09-02 DIAGNOSIS — K3189 Other diseases of stomach and duodenum: Secondary | ICD-10-CM

## 2012-09-02 MED ORDER — METOCLOPRAMIDE HCL 10 MG PO TABS: 10.0000 mg | ORAL_TABLET | Freq: Four times a day (QID) | ORAL | Status: DC | PRN

## 2012-09-02 NOTE — Telephone Encounter (Signed)
Will forward to Dr Williamson 

## 2012-09-02 NOTE — Telephone Encounter (Signed)
Refilled.   Please call patient to let him know.   Thank you.

## 2012-09-02 NOTE — Telephone Encounter (Signed)
Advised pt of med refill-reglan-per Dr [name]

## 2012-09-02 NOTE — Telephone Encounter (Signed)
Pt took his last Reglan today and is needing a refill on his Reglan - has appt on  2/19 and needs enough to last until then pls let pt know if he can get this [facility] [address]

## 2012-09-09 ENCOUNTER — Ambulatory Visit: Payer: Medicaid Other | Admitting: Family Medicine

## 2012-09-09 ENCOUNTER — Encounter: Payer: Self-pay | Admitting: Family Medicine

## 2012-09-09 ENCOUNTER — Ambulatory Visit (INDEPENDENT_AMBULATORY_CARE_PROVIDER_SITE_OTHER): Payer: Medicaid Other | Admitting: Family Medicine

## 2012-09-09 VITALS — BP 125/83 | HR 70 | Temp 98.1°F | Ht 70.0 in | Wt 169.1 lb

## 2012-09-09 MED ORDER — BUSPIRONE HCL 15 MG PO TABS: 15.0000 mg | ORAL_TABLET | Freq: Three times a day (TID) | ORAL | Status: DC

## 2012-09-09 MED ORDER — FLUOXETINE HCL 40 MG PO CAPS: 80.0000 mg | ORAL_CAPSULE | Freq: Every day | ORAL | Status: DC

## 2012-09-09 NOTE — Patient Instructions (Signed)
Dear Timothy [name],   Thank you for coming to clinic today. Please read below regarding the issues that we discussed.   1. Depression - You look like you are doing a lot better. Keep it up. I am going to increase your prozac to 80 mg daily. I am glad that you are meeting with Dr. Pascal Lux as well.   2. Anxiety - Increasing the Prozac to 80 mg daily will help. You have to learn how to deal with those periods of sudden stress, because taking diazepam all the time is not a good option. It will get easer when you can be more active, but I don't want to get you hooked on something like diazepam right now. You can use Buspar daily to help with the anxiety and on the 28th, I will write another prescription for the diazepam, but will likely not go up on the dose.  It will get better. Please bring this up with Dr. Pascal Lux.   3. Headaches - I am glad that this is improving. I suggest taking tylenol as needed and not using reglan if you don't have to. Reglan can cause tardive dyskinesia over time, which you should google. It is not a good thing. Stay away from the Goody's as well.   Please follow up in clinic in 4 weeks. Please call earlier if you have any questions or concerns.   Sincerely,   V. [name], MD

## 2012-09-09 NOTE — Assessment & Plan Note (Signed)
Mildly improved by scoring system, but still with daily symptoms. He is using his diazepam more than prescribed, and we discussed at length the dangers of long term benzodiazepine use vs dependence. He was understanding and agreeable to trying Buspar for symptom management. He was also encourage to delve into management strategies with Dr. Pascal Lux.

## 2012-09-09 NOTE — Assessment & Plan Note (Signed)
Improving. Increase fluoxetine to 80 mg daily. F/u in 4 weeks.

## 2012-09-09 NOTE — Progress Notes (Signed)
  Subjective:    Patient ID: Timothy [name], male    DOB: 09-17-1965, 47 y.o.   MRN: [id number]  HPI   47 year old M who recently underwent colostomy takedown after partial colectomy for recurrent diverticulitis. He presents today for follow up of depression, anxiety and headaches. His wife Timothy [name] accompanies him.   Headaches - Much improved; Denies any migraine; Has daily frontal headache; Take Reglan daily for headache prevention; Also takes Goody's power occassionally  Depression - Had PHQ-9 of 22 at last visit; Stopped Lexapro and started fluoxetine 40 mg; notes that his mood is improved and his wife noted an improvement in mood as well; Plans to see Spero Geralds  Anxiety - GAD7 of 21 at last visit. Currently takes diazepam 5 mg BID. 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 Has taken more than prescribed dose of diazepam and is currently out. He believes that he has developed a tolerance to this medications.   Review of Systems  See HPI    Objective:   Physical Exam  BP 125/83  Pulse 70  Temp(Src) 98.1 F (36.7 C) (Oral)  Ht 5\' 10"  (1.778 m)  Wt 169 lb 2 oz (76.715 kg)  BMI 24.27 kg/m2 Gen: well appearing, much improved general appearing including weight and skin tone Abd: well healing abdominal scar without dehiscence or drainage Psych: normal thought content, normal speech pattern, dynamic affect  GAD7 - 17  PHQ9 - 14  Assessment & Plan:  47 year old M with improving anxiety and depression.

## 2012-09-09 NOTE — Assessment & Plan Note (Signed)
Has not used alcohol since June 2013 prior to colectomy.

## 2012-09-10 ENCOUNTER — Ambulatory Visit (INDEPENDENT_AMBULATORY_CARE_PROVIDER_SITE_OTHER): Payer: Medicaid Other | Admitting: Psychology

## 2012-09-10 NOTE — Assessment & Plan Note (Addendum)
Cartel is neatly groomed and appropriately dressed.  He maintains good eye contact and is cooperative and attentive. His speech is normal in tone, rate and rhythm.  Mood is depressed with a mildly restricted affect.  Thought process is logical and goal directed.  Denied suicidal or homicidal ideation.  Does not appear to be responding to any internal stimuli.  Able to maintain train of thought and concentrate on the questions.  Cognitive ability may be low average or below average.    PHQ-9 and GAD-7 were done yesterday.  He reports a history of a hypomanic episode and he has met criteria for a major depressive episode in the last year.  He has some trouble understanding questions so not sure how reliable his answers are.  He volunteered that he has first degree relatives with Bipolar Disorder and was diagnosed with this himself.  If his current trial of an antidepressant fails, I would look to a mood stabilizer or atypical antipsychotic for the next line of pharmacotherapy.  Based on his report today, one could also consider making a change now if he were open to it.  I will let Dr. [name] know and see what he has to say.  A referral to [facility] would be good but we are not scheduling new patients until mid-April.

## 2012-09-10 NOTE — Assessment & Plan Note (Signed)
Symptoms sound consistent with panic disorder.  I support Dr. Yetta Numbers decision to not increase the dose of diazepam.  He is not a good candidate for long-term benzodiazepine use.  In addition, it is possible that his mood disorder has not been treated with the medicines most likely to make a difference.  His anxiety might actually be symptoms consistent with hypomania.  He does seem tied to a benzo.  When he takes Xanax, he reports he notices relief within 10-15 minutes.   Provided some very patient-centered education around this.  I think he was surprised to learn that this was not a medication effect he was experiencing but rather an expectation effect.    He is willing to bring his wife in for a visit so I can get additional information.  Next appointment scheduled for:  February 27th at 11:00.

## 2012-09-10 NOTE — Progress Notes (Signed)
Timothy [name] presented for an initial psychological assessment.  A client information sheet detailing the Behavioral Medicine Service was provided.  The patient voiced an understanding of what was detailed on this sheet including the issue of confidentiality and the limits thereof.  Presenting Problem: Timothy [name] was referred by his PCP for non-pharmacologic management strategies for anxiety.  He also is having symptoms of depression.  While mental health issues have been present since childhood, there has been a recent exacerbation since January 2013 when he was diagnosed with diverticulitis.  Since then, he has had multiple hospitalizations and surgeries.  He has been tried on Lexapro and is currently taking Prozac 80 mg, Buspar 15 mg three times daily and Diazepam.  He runs out of his diazepam early and is currently without.  He has a friend who gives him Xanax if he is really struggling with anxiety.  Relevant Medical History: Reviewed medical record.    Relevant Psychiatric / Psychological History: Denies hospitalizations for psychiatric reasons.  Reports a therapy experience when getting divorced from his second wife.  Was also assessed at some point (prior to 1994) and diagnosed with ADHD and Bipolar Disorder.  He will see if he can records of this.  Denies being treated with anything other than Prozac, Lexapro, Diazepam and Buspar (that he can remember)>  Family History: Reports mom has a diagnosis of Bipolar Disorder and has been tried on multiple meds including Lithium.  45 year old son has the following diagnoses:  ODD, Conduct Disorder, ADHD and Rapid Cycling Bipolar Disorder.  He is on 600 mg of Seroquel, 500 mg Depakote, and 6 mg of Invega.  Father has depression and alcohol dependence.    History of Abuse: Did not assess.  Education / Occupation: Reports he has difficulty reading.  Did not assess how far he went in school.  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  Was  eventually let go secondary to his inability to work.    Substance Use: Alcohol abuse is listed in his problem list.  Per his report, has not had anything to drink since 02/13/2012.  Prior to that would have 6-8 beers during the work week and 12-18 on the weekend.    Report of symptoms (SIGECAPS): Did not run the [employer] list today.  His does report bothersome irritable mood about 7 our of the last 14 days.  He notes bothersome sadness about 3-4 days out of the last two weeks.  He experiences insomnia (mostly nighttime awakenings) especially when feeling irritable.  Sleep requirement is about 7 hours.  He has never gone days without sleep.  Feels guilt and a sense of worthlessness around how his illness has impacted his ability to provide for his family.  This has been a major shift for him.  Denies suicidal intent or plan.    Duration of symptoms: Sounds like symptoms have been present nearly all of his life.  Has managed without medication for awhile.  Significant increase in symptomatology when he got ill this past year.    Impact on function: In the past, he has lost jobs (he had 9 jobs in one year) and relationships (2nd marriage) because of his mood and beahvior.  Tries to stay busy at home with laundry, dishes, dinner and parenting.  Worst times are when he is home alone - tends to sleep during the day out of boredom.  Watches a few hours of television a day.  Other: Had started going to church and  was recently "saved" but doesn't like being the center of attention so has stopped going.

## 2012-09-14 ENCOUNTER — Ambulatory Visit (INDEPENDENT_AMBULATORY_CARE_PROVIDER_SITE_OTHER): Payer: PRIVATE HEALTH INSURANCE | Admitting: General Surgery

## 2012-09-14 ENCOUNTER — Encounter (INDEPENDENT_AMBULATORY_CARE_PROVIDER_SITE_OTHER): Payer: Self-pay

## 2012-09-14 ENCOUNTER — Encounter (INDEPENDENT_AMBULATORY_CARE_PROVIDER_SITE_OTHER): Payer: Self-pay | Admitting: General Surgery

## 2012-09-14 VITALS — BP 132/84 | HR 76 | Temp 97.6°F | Resp 16 | Ht 70.0 in | Wt 173.6 lb

## 2012-09-14 DIAGNOSIS — IMO0002 Reserved for concepts with insufficient information to code with codable children: Secondary | ICD-10-CM

## 2012-09-14 DIAGNOSIS — Z8719 Personal history of other diseases of the digestive system: Secondary | ICD-10-CM

## 2012-09-14 NOTE — Progress Notes (Signed)
Subjective:     Patient ID: Timothy [name], male   DOB: [birth year], 47 y.o.   MRN: [id number]  HPI The patient is a 47 year old white male who is about 7 weeks status post colostomy reversal. His postop course was complicated by a small superficial wound infection. His only complaint is of some burning along the right side of the incision. His appetite is improving and he is getting about 11 pounds since his last visit.  Review of Systems     Objective:   Physical Exam On exam his abdomen is soft and nontender. His midline incision is almost completely healed.    Assessment:     The patient is 7 weeks status post colostomy reversal     Plan:     At this point I believe he can return to his normal activities without any restrictions. We will plan to see him back in about 3 months to check his progress

## 2012-09-14 NOTE — Progress Notes (Signed)
Subjective:     Patient ID: Timothy [name], male   DOB: 12-22-1965, 47 y.o.   MRN: [id number]  HPI The patient is a 47 year old white male who is about 3 weeks status post colostomy reversal. His postoperative course was complicated by small superficial wound infection. His main complaint today is that of loss of appetite.  Review of Systems     Objective:   Physical Exam On exam his abdomen is soft with minimal tenderness. The upper portion of his midline incision is opened a very clean with granulation tissue.    Assessment:     The patient is 3 weeks status post colostomy reversal     Plan:     At this point I would like to refrain from any heavy lifting. I've encouraged him to try protein supplements like Carnation Instant Breakfast or Ensure. We will plan to see him back in about 3 or 4 weeks to check his progress

## 2012-09-14 NOTE — Patient Instructions (Signed)
May return to all normal activities 

## 2012-09-16 ENCOUNTER — Emergency Department (HOSPITAL_COMMUNITY): Payer: Medicaid Other

## 2012-09-16 ENCOUNTER — Emergency Department (HOSPITAL_COMMUNITY)
Admission: EM | Admit: 2012-09-16 | Discharge: 2012-09-16 | Disposition: A | Discharge status: Home / Self Care | Payer: Medicaid Other | Attending: Emergency Medicine | Admitting: Emergency Medicine

## 2012-09-16 ENCOUNTER — Encounter (HOSPITAL_COMMUNITY): Payer: Self-pay | Admitting: *Deleted

## 2012-09-16 ENCOUNTER — Telehealth (INDEPENDENT_AMBULATORY_CARE_PROVIDER_SITE_OTHER): Payer: Self-pay

## 2012-09-16 DIAGNOSIS — Z872 Personal history of diseases of the skin and subcutaneous tissue: Secondary | ICD-10-CM | POA: Insufficient documentation

## 2012-09-16 DIAGNOSIS — Z8709 Personal history of other diseases of the respiratory system: Secondary | ICD-10-CM | POA: Insufficient documentation

## 2012-09-16 DIAGNOSIS — K219 Gastro-esophageal reflux disease without esophagitis: Secondary | ICD-10-CM | POA: Insufficient documentation

## 2012-09-16 DIAGNOSIS — R1110 Vomiting, unspecified: Secondary | ICD-10-CM | POA: Insufficient documentation

## 2012-09-16 DIAGNOSIS — Z79899 Other long term (current) drug therapy: Secondary | ICD-10-CM | POA: Insufficient documentation

## 2012-09-16 DIAGNOSIS — E162 Hypoglycemia, unspecified: Secondary | ICD-10-CM | POA: Insufficient documentation

## 2012-09-16 DIAGNOSIS — F411 Generalized anxiety disorder: Secondary | ICD-10-CM | POA: Insufficient documentation

## 2012-09-16 DIAGNOSIS — F1010 Alcohol abuse, uncomplicated: Secondary | ICD-10-CM | POA: Insufficient documentation

## 2012-09-16 DIAGNOSIS — F19939 Other psychoactive substance use, unspecified with withdrawal, unspecified: Secondary | ICD-10-CM | POA: Insufficient documentation

## 2012-09-16 DIAGNOSIS — G4730 Sleep apnea, unspecified: Secondary | ICD-10-CM | POA: Insufficient documentation

## 2012-09-16 DIAGNOSIS — Z8614 Personal history of Methicillin resistant Staphylococcus aureus infection: Secondary | ICD-10-CM | POA: Insufficient documentation

## 2012-09-16 DIAGNOSIS — Z862 Personal history of diseases of the blood and blood-forming organs and certain disorders involving the immune mechanism: Secondary | ICD-10-CM | POA: Insufficient documentation

## 2012-09-16 DIAGNOSIS — Z8719 Personal history of other diseases of the digestive system: Secondary | ICD-10-CM | POA: Insufficient documentation

## 2012-09-16 DIAGNOSIS — Z8619 Personal history of other infectious and parasitic diseases: Secondary | ICD-10-CM | POA: Insufficient documentation

## 2012-09-16 DIAGNOSIS — F3289 Other specified depressive episodes: Secondary | ICD-10-CM | POA: Insufficient documentation

## 2012-09-16 DIAGNOSIS — R42 Dizziness and giddiness: Secondary | ICD-10-CM | POA: Insufficient documentation

## 2012-09-16 DIAGNOSIS — F17200 Nicotine dependence, unspecified, uncomplicated: Secondary | ICD-10-CM | POA: Insufficient documentation

## 2012-09-16 DIAGNOSIS — Z8489 Family history of other specified conditions: Secondary | ICD-10-CM | POA: Insufficient documentation

## 2012-09-16 DIAGNOSIS — H409 Unspecified glaucoma: Secondary | ICD-10-CM | POA: Insufficient documentation

## 2012-09-16 DIAGNOSIS — R531 Weakness: Secondary | ICD-10-CM

## 2012-09-16 DIAGNOSIS — Z8679 Personal history of other diseases of the circulatory system: Secondary | ICD-10-CM | POA: Insufficient documentation

## 2012-09-16 DIAGNOSIS — R5381 Other malaise: Secondary | ICD-10-CM | POA: Insufficient documentation

## 2012-09-16 LAB — CBC WITH DIFFERENTIAL/PLATELET
Basophils Relative: 0 % (ref 0–1)
HCT: 42.4 % (ref 39.0–52.0)
Hemoglobin: 14.3 g/dL (ref 13.0–17.0)
Lymphocytes Relative: 18 % (ref 12–46)
Lymphs Abs: 1.9 10*3/uL (ref 0.7–4.0)
MCHC: 33.7 g/dL (ref 30.0–36.0)
Monocytes Absolute: 0.5 10*3/uL (ref 0.1–1.0)
Monocytes Relative: 5 % (ref 3–12)
Neutro Abs: 8.2 10*3/uL — ABNORMAL HIGH (ref 1.7–7.7)
RBC: 4.84 MIL/uL (ref 4.22–5.81)

## 2012-09-16 LAB — COMPREHENSIVE METABOLIC PANEL
Alkaline Phosphatase: 109 U/L (ref 39–117)
BUN: 6 mg/dL (ref 6–23)
CO2: 23 mEq/L (ref 19–32)
Chloride: 102 mEq/L (ref 96–112)
Creatinine, Ser: 0.54 mg/dL (ref 0.50–1.35)
GFR calc Af Amer: >90 mL/min (ref >90)
GFR calc non Af Amer: >90 mL/min (ref >90)
Glucose, Bld: 125 mg/dL — ABNORMAL HIGH (ref 70–99)
Potassium: 3.6 mEq/L (ref 3.5–5.1)
Total Bilirubin: 0.2 mg/dL — ABNORMAL LOW (ref 0.3–1.2)

## 2012-09-16 LAB — GLUCOSE, CAPILLARY: Glucose-Capillary: 119 mg/dL — ABNORMAL HIGH (ref 70–99)

## 2012-09-16 LAB — URINALYSIS, ROUTINE W REFLEX MICROSCOPIC
Ketones, ur: NEGATIVE mg/dL
Leukocytes, UA: NEGATIVE
Protein, ur: NEGATIVE mg/dL
Urobilinogen, UA: 0.2 mg/dL (ref 0.0–1.0)

## 2012-09-16 MED ORDER — DIAZEPAM 5 MG PO TABS: 5.0000 mg | ORAL_TABLET | Freq: Two times a day (BID) | ORAL | Status: DC

## 2012-09-16 MED ORDER — DIAZEPAM 5 MG PO TABS
5.0000 mg | ORAL_TABLET | Freq: Once | ORAL | Status: AC
  Administered 2012-09-16: 5 mg via ORAL
  Filled 2012-09-16: qty 1

## 2012-09-16 MED ORDER — MUPIROCIN 2 % EX OINT: TOPICAL_OINTMENT | Freq: Three times a day (TID) | CUTANEOUS | Status: DC

## 2012-09-16 NOTE — ED Provider Notes (Signed)
Medical screening examination/treatment/procedure(s) were performed by non-physician practitioner and as supervising physician I was immediately available for consultation/collaboration.  Gustava Berland R. Jolita Haefner, MD 09/16/12 1555 

## 2012-09-16 NOTE — ED Notes (Signed)
Patient wants blood drawn from IV

## 2012-09-16 NOTE — ED Provider Notes (Signed)
History     CSN: [id number]  Arrival date & time 09/16/12  1042   First MD Initiated Contact with Patient 09/16/12 1207      Chief Complaint  Patient presents with  . Dizziness  . Emesis    (Consider location/radiation/quality/duration/timing/severity/associated sxs/prior treatment) HPI Timothy [name] is a 47 y.o. male who presents with complaint of dizziness, cold sweats, shakiness, nausea, vomiting for the last "several days." states had colectomy in July of 2013, which was followed by a problem with anastomosis and leakage. Pt ended up needed a colostomy, which he had taken down on Jan 2nd. Pt states since then, he has been doing well, and has had no problems. States did develop a superficial MRSA infection, for which he is doing wet to dry dressings. States in the last 3 days, developed sweats, shakes, nausea, vomiting. States no abdominal pain. Still having normal bowel movements and able to keep food and fluids down. Pt states last night vomited multiple times. Called his surgeon today was told to come to ER>    Past Medical History  Diagnosis Date  . Hypoglycemia   . GERD (gastroesophageal reflux disease)   . Diverticulosis     By colonoscopy June 2005  . Lower GI bleed     June 2005. Presumably secondary to diverticulosis.  . Anemia   . Anxiety   . Alcohol abuse   . Hemorrhoids   . Family history of anesthesia complication     Mother N/V  . Bronchitis     history  . Cellulitis     - left knee-2009  . Glaucoma syndrome     does not use eye drops, "They burn".  . Sleep apnea     witness apnea by wife.  . Elevated CK 3/13  . Depression   . Incisional hernia   . Prinzmetal angina   . History of stomach ulcers 2005  . Headache     "alot; not daily" (07/23/2012)  . Migraines     "not often" (07/23/2012)  . Condyloma 02/04/2012    Past Surgical History  Procedure Laterality Date  . Appendectomy  1982  . Elbow fracture surgery  ~ 1975    left;  pins inserted   .  Wisdom tooth extraction  ~ 1983  . Laparoscopic left colon resection  02/19/2012    SIGMOID  . Wart fulguration  02/19/2012    Procedure: FULGURATION ANAL WART;  Surgeon: Robyne Askew, MD;  Location: [facility] OR;  Service: General;  Laterality: N/A;  Destroy  Anal Condyloma   . Laparotomy  02/27/2012    Procedure: EXPLORATORY LAPAROTOMY;  Surgeon: Robyne Askew, MD;  Location: [facility] OR;  Service: General;  Laterality: N/A;  . Colostomy  02/27/2012    Procedure: COLOSTOMY;  Surgeon: Robyne Askew, MD;  Location: [facility] OR;  Service: General;  Laterality: N/A;  . Colon resection  02/27/2012    Procedure: COLON RESECTION;  Surgeon: Robyne Askew, MD;  Location: [facility] OR;  Service: General;  Laterality: N/A;  . Colostomy takedown  07/23/2012  . Abdominal exploration surgery  07/23/2012    w/LOA (07/23/2012)  . Colostomy takedown  07/23/2012    Procedure: COLOSTOMY TAKEDOWN;  Surgeon: Robyne Askew, MD;  Location: [facility] OR;  Service: General;  Laterality: N/A;  Primary Anastomosis  . Lesion destruction  07/23/2012    Procedure: DESTRUCTION LESION ANUS;  Surgeon: Robyne Askew, MD;  Location: [facility] OR;  Service: General;  Laterality: N/A;  DESTRUCTION ANAL CONDYLOMA  . Lysis of adhesion  07/23/2012    Procedure: LYSIS OF ADHESION;  Surgeon: Robyne Askew, MD;  Location: [facility] OR;  Service: General;  Laterality: N/A;  . Laparotomy  07/23/2012    Procedure: EXPLORATORY LAPAROTOMY;  Surgeon: Robyne Askew, MD;  Location: [facility] OR;  Service: General;  Laterality: N/A;    Family History  Problem Relation Age of Onset  . Diabetes Mother   . Parkinsonism Mother   . Depression Mother   . Alcohol abuse Father   . Hypertension Father   . Breast cancer Maternal Aunt   . Stroke Neg Hx   . Heart disease Neg Hx   . Anxiety disorder Father   . Ulcers Father   . Colon polyps Father   . Diabetes Mother   . Diabetes Maternal Grandmother     History  Substance Use Topics  . Smoking status: Current Every Day Smoker -- 3.00  packs/day for 27 years    Types: Cigarettes  . Smokeless tobacco: Former User    Types: Chew     Comment: 07/23/2012 "raely uses dip"  . Alcohol Use: No     Comment: h/o 24 PK/ WEEKEND; 07/23/2012 "sober since 02/12/2013"      Review of Systems  Constitutional: Positive for diaphoresis and fatigue. Negative for fever and chills.  Respiratory: Negative.   Cardiovascular: Negative.   Gastrointestinal: Positive for nausea and vomiting. Negative for abdominal pain, diarrhea and constipation.  Genitourinary: Negative for dysuria.  Musculoskeletal: Negative.   Skin: Negative.   Neurological: Positive for dizziness, weakness and headaches.  All other systems reviewed and are negative.    Allergies  Ambien; Darvocet; and Morphine and related  Home Medications   Current Outpatient Rx  Name  Route  Sig  Dispense  Refill  . busPIRone (BUSPAR) 15 MG tablet   Oral   Take 1 tablet (15 mg total) by mouth 3 (three) times daily.   90 tablet   1   . diazepam (VALIUM) 5 MG tablet   Oral   Take 1 tablet (5 mg total) by mouth every 12 (twelve) hours as needed. For anxiety/sleep   60 tablet   1   . diphenhydrAMINE (BENADRYL) 25 MG tablet   Oral   Take 25 mg by mouth every 6 (six) hours as needed. For itching         . FLUoxetine (PROZAC) 40 MG capsule   Oral   Take 2 capsules (80 mg total) by mouth daily.   60 capsule   5   . HYDROcodone-acetaminophen (NORCO/VICODIN) 5-325 MG per tablet   Oral   Take 1-1.5 tablets by mouth every 6 (six) hours as needed for pain.          Marland Kitchen ibuprofen (ADVIL,MOTRIN) 200 MG tablet   Oral   Take 800 mg by mouth every 6 (six) hours as needed for pain.         Marland Kitchen metoCLOPramide (REGLAN) 10 MG tablet   Oral   Take 10 mg by mouth every 6 (six) hours as needed (nausea/migraines).         Marland Kitchen OVER THE COUNTER MEDICATION   Oral   Take 2 tablets by mouth daily. Medication:Thymunose OTC         . polyethylene glycol (MIRALAX / GLYCOLAX) packet    Oral   Take 17 g by mouth daily.           BP 176/90  Pulse 84  Temp(Src) 97.8 F (36.6 C) (Oral)  Resp 20  SpO2 98%  Physical Exam  Nursing note and vitals reviewed. Constitutional: He is oriented to person, place, and time. He appears well-developed and well-nourished. No distress.  HENT:  Head: Normocephalic.  Eyes: Conjunctivae and EOM are normal. Pupils are equal, round, and reactive to light.  Neck: Neck supple.  Cardiovascular: Normal rate, regular rhythm and normal heart sounds.   Pulmonary/Chest: Effort normal and breath sounds normal. No respiratory distress. He has no wheezes. He has no rales.  Abdominal: Soft. Bowel sounds are normal. He exhibits no distension. There is no tenderness. There is no rebound.  Midline healing incision with small area of drainage, and induration just above the umbilici's. Non tender  Musculoskeletal: He exhibits no edema.  Neurological: He is alert and oriented to person, place, and time.  Skin: Skin is warm and dry.    ED Course  Procedures (including critical care time)  Results for orders placed during the hospital encounter of 09/16/12  CBC WITH DIFFERENTIAL      Result Value Range   WBC 10.7 (*) 4.0 - 10.5 K/uL   RBC 4.84  4.22 - 5.81 MIL/uL   Hemoglobin 14.3  13.0 - 17.0 g/dL   HCT 40.9  81.1 - 91.4 %   MCV 87.6  78.0 - 100.0 fL   MCH 29.5  26.0 - 34.0 pg   MCHC 33.7  30.0 - 36.0 g/dL   RDW 78.2 (*) 95.6 - 21.3 %   Platelets 378  150 - 400 K/uL   Neutrophils Relative 77  43 - 77 %   Neutro Abs 8.2 (*) 1.7 - 7.7 K/uL   Lymphocytes Relative 18  12 - 46 %   Lymphs Abs 1.9  0.7 - 4.0 K/uL   Monocytes Relative 5  3 - 12 %   Monocytes Absolute 0.5  0.1 - 1.0 K/uL   Eosinophils Relative 0  0 - 5 %   Eosinophils Absolute 0.0  0.0 - 0.7 K/uL   Basophils Relative 0  0 - 1 %   Basophils Absolute 0.0  0.0 - 0.1 K/uL  COMPREHENSIVE METABOLIC PANEL      Result Value Range   Sodium 140  135 - 145 mEq/L   Potassium 3.6  3.5 -  5.1 mEq/L   Chloride 102  96 - 112 mEq/L   CO2 23  19 - 32 mEq/L   Glucose, Bld 125 (*) 70 - 99 mg/dL   BUN 6  6 - 23 mg/dL   Creatinine, Ser 0.86  0.50 - 1.35 mg/dL   Calcium 57.8 (*) 8.4 - 10.5 mg/dL   Total Protein 8.5 (*) 6.0 - 8.3 g/dL   Albumin 4.5  3.5 - 5.2 g/dL   AST 15  0 - 37 U/L   ALT 16  0 - 53 U/L   Alkaline Phosphatase 109  39 - 117 U/L   Total Bilirubin 0.2 (*) 0.3 - 1.2 mg/dL   GFR calc non Af Amer >90  >90 mL/min   GFR calc Af Amer >90  >90 mL/min  URINALYSIS, ROUTINE W REFLEX MICROSCOPIC      Result Value Range   Color, Urine YELLOW  YELLOW   APPearance CLEAR  CLEAR   Specific Gravity, Urine 1.010  1.005 - 1.030   pH 7.5  5.0 - 8.0   Glucose, UA NEGATIVE  NEGATIVE mg/dL   Hgb urine dipstick NEGATIVE  NEGATIVE   Bilirubin Urine  NEGATIVE  NEGATIVE   Ketones, ur NEGATIVE  NEGATIVE mg/dL   Protein, ur NEGATIVE  NEGATIVE mg/dL   Urobilinogen, UA 0.2  0.0 - 1.0 mg/dL   Nitrite NEGATIVE  NEGATIVE   Leukocytes, UA NEGATIVE  NEGATIVE  GLUCOSE, CAPILLARY      Result Value Range   Glucose-Capillary 119 (*) 70 - 99 mg/dL  LIPASE, BLOOD      Result Value Range   Lipase 29  11 - 59 U/L   Dg Abd Acute W/chest  09/16/2012  *RADIOLOGY REPORT*  Clinical Data: Nausea, vomiting, abdominal pain.  ACUTE ABDOMEN SERIES (ABDOMEN 2 VIEW & CHEST 1 VIEW)  Comparison: CT 03/24/2012.  Findings: The bowel gas pattern is normal.  There is no evidence of free intraperitoneal air.  No suspicious radio-opaque calculi or other significant radiographic abnormality is seen. Heart size and mediastinal contours are within normal limits.  Both lungs are clear.  IMPRESSION: No acute findings.   Original Report Authenticated By: Charlett Nose, M.D.       1. Dizziness   2. Weakness   3. Vomiting   4. Benzodiazepine withdrawal       MDM  PT with nausea, vomiting, sweats, irritability, insomnia, shakiness. No abdominal pain. Normal for him bowel movements. Exam negative for abdominal  tenderenss. VS normal other than HTN. Afther further questioning, it was determined that pt did just stop his valium about 6 days ago, after taking more pills than prescribed and running out. States next refill is not for another two days. Also states stopped his pain medications, vicodin, that he has been on for 8 months, stopped it abruptly 3 days ago, because "i am not in pain any more." this could be a possible cause of his symptoms. His labs and acute abdominal series are negative. Pt was seen here by Dr. Carolynne Edouard, who did not think this was related to his surgery and take down. Pt continues to have soft abdomen. I did treat his symtoms with valium in ED and all of his symptoms have resolved. i will provide him with two days of valium to help with withdrawal symptoms, he does not wish to stop valium at this time. Instructed to taper down pain medications if wants to get off. Pat agrees. D/c home in improved and stable condition.   Filed Vitals:   09/16/12 1057  BP: 176/90  Pulse: 84  Temp: 97.8 F (36.6 C)  TempSrc: Oral  Resp: 20  SpO2: 98%           Lottie Mussel, PA 09/16/12 1435

## 2012-09-16 NOTE — ED Notes (Signed)
Pt had reversal for colostomy in January and had incisional infection related to MRSA.  Pt is here with hot flashes, cold sweats, shakey, hypertension, nausea, denies diarrhea or constipation.  Symptoms started on Monday nite.    Pt denies pain anywhere.

## 2012-09-16 NOTE — Telephone Encounter (Signed)
Patient wife called stating Timothy [name] has had n/v since Monday night and BP is 174/94. They are currently at Dr Laverle Patter office at [facility] urology and asked what they should do. I paged Dr Carolynne Edouard and he said we could call in phenergan suppository or he could go to ER. He had a phenergan last night and he felt better but he says he fells dizzy and weak. I told her to take him to ER. They asked if they should stay to see Dr Laverle Patter since it take 3 months to get back in to se him and I said yes and to go to ER after visit. I told her I would call Dr Laverle Patter office and see if I can get him to see hi sooner so he can head to ER as soon as possible.

## 2012-09-17 ENCOUNTER — Encounter: Payer: Self-pay | Admitting: Psychology

## 2012-09-17 ENCOUNTER — Ambulatory Visit (INDEPENDENT_AMBULATORY_CARE_PROVIDER_SITE_OTHER): Payer: Medicaid Other | Admitting: Psychology

## 2012-09-17 NOTE — Assessment & Plan Note (Signed)
Given his report of what he drinks daily, t is impossible to tell what is anxiety and what is caffeine intoxication.  His anxiety symptoms (for which he is overusing benzos) could very well be caffeine intoxication.  In addition, what he experiences as anxiety could also be an untreated Bipolar Spectrum Disorder.  Can't tell at present.

## 2012-09-17 NOTE — Assessment & Plan Note (Signed)
Smoking four packs of cigarettes a day.  Has quit for three months in the past.  Wife smokes too.  She says she would quit if he did.

## 2012-09-17 NOTE — Assessment & Plan Note (Addendum)
Both report he is not currently drinking.  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  History of DUI.  Emotionally abusive when drinking past 7 beers.  Never been treated (AA meetings were a requirement and he was not ready for treatment at that time).  I suspect the benzodiazepines have taken the place of the alcohol.    He needs comprehensive substance abuse treatment.  Inpatient would likely be best but he has limited resources.  [facility] might be a nice option as they have psychiatric consultation.  [facility] is an option.  AA is always an option.  Strongly recommended [facility] for his wife.    Asked them to consider the information provided today.  I will discuss with Dr. [name] and I will get back to them.  He needs a plan from detoxing safely from benzos and narcotics.  ED gave him six pills which should get him through to his refill.  These are not good long-term medicines for him given his history and mood issues.    Of note:  History of cocaine abuse (possible dependence).  Never treated.

## 2012-09-17 NOTE — Assessment & Plan Note (Signed)
I suspect, given his family history and substance abuse issues, that he has a Bipolar Spectrum Disorder.  Given his substance use, there is no way to tell what he really has until he stops self-medicating.  I think he needs a referral for substance abuse treatment and a psychiatrist to manage medication.  The Prozac may be making things worse but it is impossible to tell given his self-medication.

## 2012-09-17 NOTE — Progress Notes (Signed)
Second visit with Timothy [name].  He brought his wife Timothy [name].  Goal of the visit was to better understand mood issues in hopes of clarifying the diagnosis (Bipolar Spectrum Disorder vs. Depression and Anxiety).  They presented me with paperwork from an ED visit yesterday where the diagnoses of narcotic and benzodiazepine withdrawal was made.  They provided me with the following information:  - Timothy [name] drinks on average four cups of coffee and 6-8 20 oz Diet Mountain Dews daily. - Timothy [name] smokes about FOUR packs of cigarettes a day. - He overuses his Diazepam and ran out.  He uses his wife's Xanax on occasion. - He takes ibuprofen 200 mg daily and uses Goody powders at least once if not several times a day.  While in the hospital, he stopped smoking (and quit for three months on one occasion), decreased his caffeine use to a few caffeine beverages a day.  He also stopped drinking "cold [place]" on 02/12/13.  He has a DUI from [place] and attended 62 AA court ordered AA Meetings.

## 2012-09-18 ENCOUNTER — Telehealth: Payer: Self-pay | Admitting: Psychology

## 2012-09-18 NOTE — Telephone Encounter (Signed)
Called [facility] to follow-up from yesterday's appointment.  I wanted to check-in to see how they were processing the information and also to offer up resources since I will be out of town until mid next week.  I left a VM with that information and asked them to follow with Dr. [name] if they want resources prior to my return.  [facility] ([facility] is a good place to start:  [phone] [facility] is also a possibility:  [phone] [facility] and [facility] are great places for either of them to start as well.

## 2012-09-21 ENCOUNTER — Ambulatory Visit (INDEPENDENT_AMBULATORY_CARE_PROVIDER_SITE_OTHER): Payer: PRIVATE HEALTH INSURANCE | Admitting: General Surgery

## 2012-09-21 ENCOUNTER — Encounter (INDEPENDENT_AMBULATORY_CARE_PROVIDER_SITE_OTHER): Payer: Self-pay | Admitting: General Surgery

## 2012-09-21 VITALS — BP 132/80 | HR 88 | Temp 98.7°F | Resp 18 | Ht 70.0 in | Wt 169.0 lb

## 2012-09-21 MED ORDER — DOXYCYCLINE HYCLATE 100 MG PO TABS: 100.0000 mg | ORAL_TABLET | Freq: Two times a day (BID) | ORAL | Status: DC

## 2012-09-21 NOTE — Progress Notes (Signed)
Subjective:     Patient ID: Timothy [name], male   DOB: January 14, 1966, 47 y.o.   MRN: [id number]  HPI 47 y/o s/p colostomy reversal in early January Has been doing well. He was seen by one of my partners to do the surgery recently. He comes in today after being seen in the emergency room recently. Dr. Carolynne Edouard saw him at that time and his wound apparently looked fine. Someone gave him some Bactroban to put on the wound and he states that this is bothering him since then. It got red after the Bactroban and then this receded. But since then he said some pain in his incision as well as some persistent redness and a little bit of drainage coming from the upper portion. He is having normal bowel movements. He has not have any fevers associated with this. He otherwise feels well.  Review of Systems     Objective:   Physical Exam Midline wound is healed essentially except for about a 2 mm hole in the upper portion where the scar is the widest. I probed this area with a Q-tip and found this to have a small amount of clear fluid and about 1 x 1 cm pocket. There is some mild erythema associated with his wound without any blanching.    Assessment:     Possible wound infection     Plan:     I gave a prescription for some antibiotics. If this may be a wound infection. I also packed the small cavity and I told him they could remove this in 48 hours. He will followup with Dr. Carolynne Edouard one week.

## 2012-09-24 ENCOUNTER — Telehealth: Payer: Self-pay | Admitting: Psychology

## 2012-09-24 NOTE — Telephone Encounter (Signed)
Connected by phone with Jeannett Senior to check-in.  He reports he is slowly weaning himself off the Diazepam.  He is mostly just taking one pill at night - none in the morning.  He has switched to caffeine-free Diet [facility] and cut his coffee down to 2.5 cups a day.  If his initial report was correct, this is a significant decrease in daily caffeine intake.  He has noticed some headaches but is managing them with a "headache medicine" and he has also noticed that he feels less jittery.  He is still smoking "like a freight train."  He is not interested in touching that right now.  He and [name] spoke with their pastor and he is starting a group class that is "like AA" but within their church.  He was told that this group has a very high success rate for those who participate.   Discussed options.  I encouraged him to follow with Dr. [name] - he has an appointment later this month.  He agreed to an appointment in [facility] so we could look at his Prozac and see if there might be a better option there.  If he has Bipolar Disorder, which I suspect he does and he struggles with Migraine, we might look to Depakote to target irritability and migraine.  Not sure if this would be a good match for his medical conditions.  Will wait to see what Dr. Kathrynn Running thinks in [facility]

## 2012-10-02 NOTE — Telephone Encounter (Addendum)
Spoke with patient to check in. He had not had caffeine in 2 days. He is taking diazepam at night to sleep and occasionally in the morning. He is taking Buspar in the morning and night. He had stopped all pain medications. He feels much better now that he has returned to light duty at work. He plans to attend a substance abuse group through his church with his family and is looking forward to it. Plans to return to clinic on 10/14/12.

## 2012-10-05 ENCOUNTER — Telehealth: Payer: Self-pay | Admitting: Family Medicine

## 2012-10-05 NOTE — Telephone Encounter (Signed)
Forward to PCP for refill

## 2012-10-05 NOTE — Telephone Encounter (Signed)
Patient is calling requesting refills on metoCLOPramide.  He is cutting his caffeine back and is getting more headaches.  He uses [facility] in [place].  Patient would like a call when this has been done.

## 2012-10-06 NOTE — Telephone Encounter (Signed)
Please tell Mr. Dusenbery that refilling this medication is not a good choice for headaches given the side effects that we discussed before. I would use tylenol right now. We can discuss other options at his visit. Thank you.

## 2012-10-06 NOTE — Telephone Encounter (Signed)
Called and read message to patient from Dr [name].Busick, Rodena Medin

## 2012-10-14 ENCOUNTER — Ambulatory Visit (INDEPENDENT_AMBULATORY_CARE_PROVIDER_SITE_OTHER): Payer: Medicaid Other | Admitting: Family Medicine

## 2012-10-14 ENCOUNTER — Telehealth: Payer: Self-pay | Admitting: Psychology

## 2012-10-14 ENCOUNTER — Ambulatory Visit: Payer: Medicaid Other | Admitting: Family Medicine

## 2012-10-14 ENCOUNTER — Encounter: Payer: Self-pay | Admitting: Family Medicine

## 2012-10-14 VITALS — BP 147/81 | HR 74 | Temp 97.9°F | Ht 70.0 in | Wt 172.0 lb

## 2012-10-14 DIAGNOSIS — G43909 Migraine, unspecified, not intractable, without status migrainosus: Secondary | ICD-10-CM

## 2012-10-14 DIAGNOSIS — F39 Unspecified mood [affective] disorder: Secondary | ICD-10-CM

## 2012-10-14 DIAGNOSIS — F410 Panic disorder [episodic paroxysmal anxiety] without agoraphobia: Secondary | ICD-10-CM

## 2012-10-14 DIAGNOSIS — F411 Generalized anxiety disorder: Secondary | ICD-10-CM

## 2012-10-14 DIAGNOSIS — F419 Anxiety disorder, unspecified: Secondary | ICD-10-CM

## 2012-10-14 MED ORDER — DIAZEPAM 5 MG PO TABS: 5.0000 mg | ORAL_TABLET | Freq: Two times a day (BID) | ORAL | Status: DC | PRN

## 2012-10-14 NOTE — Progress Notes (Signed)
  Subjective:    Patient ID: Timothy [name], male    DOB: 1966/07/22, 47 y.o.   MRN: [id number]  HPI:  [name] comes in for follow up.  He says he is doing OK.  He says he has cut back on his caffeine intake- the is only drinking about 2 cups of coffee per day, and usually drinks diet caffeine free mountain dew, but sometimes has a diet mountain dew with caffeine.  He says his headaches are improving, but if it is really bad he goes ahead and has some caffeine.    Anxiety- says he is doing OK, still significant worries and symptoms.  He says he is taking buspar which helps, and some days only takes 1/2 or no diazepam in the am but usually still takes one at bedtime because he cannot sleep otherwise.   Depression- taking prozac 80, denies side effects, says he thinks it is helping.  Still with significant feelings of worthlessness, fatigue.  Denies SI/HI.   Past Medical History  Diagnosis Date  . Hypoglycemia   . GERD (gastroesophageal reflux disease)   . Diverticulosis     By colonoscopy June 2005  . Lower GI bleed     June 2005. Presumably secondary to diverticulosis.  . Anemia   . Anxiety   . Alcohol abuse   . Hemorrhoids   . Family history of anesthesia complication     Mother N/V  . Bronchitis     history  . Cellulitis     - left knee-2009  . Glaucoma syndrome     does not use eye drops, "They burn".  . Sleep apnea     witness apnea by wife.  . Elevated CK 3/13  . Depression   . Incisional hernia   . Prinzmetal angina   . History of stomach ulcers 2005  . Headache     "alot; not daily" (07/23/2012)  . Migraines     "not often" (07/23/2012)  . Condyloma 02/04/2012    History  Substance Use Topics  . Smoking status: Current Every Day Smoker -- 3.00 packs/day for 27 years    Types: Cigarettes  . Smokeless tobacco: Former User    Types: Chew     Comment: 07/23/2012 "raely uses dip"  . Alcohol Use: No     Comment: h/o 24 PK/ WEEKEND; 07/23/2012 "sober since 02/12/2013"     Family History  Problem Relation Age of Onset  . Diabetes Mother   . Parkinsonism Mother   . Depression Mother   . Alcohol abuse Father   . Hypertension Father   . Breast cancer Maternal Aunt   . Stroke Neg Hx   . Heart disease Neg Hx   . Anxiety disorder Father   . Ulcers Father   . Colon polyps Father   . Diabetes Mother   . Diabetes Maternal Grandmother      ROS Pertinent items in HPI    Objective:  Physical Exam:  BP 147/81  Pulse 74  Temp(Src) 97.9 F (36.6 C) (Oral)  Ht 5\' 10"  (1.778 m)  Wt 172 lb (78.019 kg)  BMI 24.68 kg/m2 General appearance: alert, cooperative and no distress Psych: normal speech, thought content, judgement, not responding to internal stimuli, no SI/HI.  PHQ 9: 14 GAD7: 15     Assessment & Plan:

## 2012-10-14 NOTE — Assessment & Plan Note (Signed)
Improving with caffeine decreasing- no longer taking reglan.  Encouraged to continue minimal or no caffeine.

## 2012-10-14 NOTE — Telephone Encounter (Signed)
Patient called to see when [facility] appointment was for sure.  Let him know.  He is still taking 80 mg of Prozac and the Valium at night for sleep.  With his light duty work, he is not taking it in the morning as much but occasionally.  He is interested in meeting with [facility] to take a look at his mood and medications.

## 2012-10-14 NOTE — Assessment & Plan Note (Signed)
GAD score improved slightly to 15 today.  Will continue buspar, prozac, and diazepam at current dose- f/u with Dr. Pascal Lux in one month and Dr. [name] in 2 months or sooner if needed.

## 2012-10-14 NOTE — Assessment & Plan Note (Signed)
PHQ 9 stable at 14, though he subjectively endorses improvement today.  Will continue prozac at 80, f/u with Dr. Pascal Lux in 1 month.

## 2012-10-14 NOTE — Patient Instructions (Signed)
It was nice to meet you- congratulations on cutting back on your caffeine - that is a hard thing to do but you've made some very healthy choices!  Please continue to only use the diazepam as needed, and take the prozac and buspar daily.

## 2012-10-19 ENCOUNTER — Ambulatory Visit (INDEPENDENT_AMBULATORY_CARE_PROVIDER_SITE_OTHER): Payer: Medicaid Other | Admitting: General Surgery

## 2012-10-19 ENCOUNTER — Encounter (INDEPENDENT_AMBULATORY_CARE_PROVIDER_SITE_OTHER): Payer: Self-pay | Admitting: General Surgery

## 2012-10-19 VITALS — BP 144/82 | HR 69 | Temp 98.6°F | Resp 16 | Ht 70.0 in | Wt 171.2 lb

## 2012-10-19 DIAGNOSIS — T8189XA Other complications of procedures, not elsewhere classified, initial encounter: Secondary | ICD-10-CM | POA: Insufficient documentation

## 2012-10-19 DIAGNOSIS — IMO0002 Reserved for concepts with insufficient information to code with codable children: Secondary | ICD-10-CM

## 2012-10-19 MED ORDER — HYDROCODONE-ACETAMINOPHEN 5-325 MG PO TABS: 1.0000 | ORAL_TABLET | Freq: Four times a day (QID) | ORAL | Status: DC | PRN

## 2012-10-19 NOTE — Progress Notes (Signed)
The patient comes in with a small opening approximately 1 cm in size in the upper midportion of his wound. He states that he pulled out some stringy white material from there last night. He showed me a picture of the wound which is protruding and a small amount of purulent drainage.  On today's examination the wound is crusted dry. Was able to remove the crusted eschar using Betadine swab. I then cleansed the wound with peroxide on a Q-tip. The Q-tip but only for maybe a half a centimeter underneath the skin. Unlikely what he had was a suture related inflammation which should resolve without any specific treatment.  He reports to me that he is allergic to bacitracin. Because of this he only needs to keep his wound clean with soap and water pat it dry maybe use some salt water to clean out the wound was a small Q-tip and keep it covered Fisher return to see Dr. Carolynne Edouard on the regular scheduled appointment at the end of this month.

## 2012-10-27 ENCOUNTER — Telehealth: Payer: Self-pay | Admitting: Psychology

## 2012-10-27 NOTE — Telephone Encounter (Signed)
Timothy [name] called at his wife's urging.  He is very irritable and more fidgety.  He reports he is down to one cup of coffee.  His Diet Mountain Dews are all caffeine free.  He says he is down to two packs of cigarettes a day and not drinking alcohol.  He takes Prozac and Valium.  He is not interested in [place].  He is interested in a [place] [facility] program through his church but has not followed through.  Encouraged that.  He has an appointment in [facility] next week.  Told him that the work he has done decreasing his multiple substance use is instrumental in [place] being able to help him.  Also said that it is not surprising that he is more irritable and fidgety now that he isn't self-medicating.  Wish we could get him in sooner but no openings.  No evidence of SI or HI.  Suspect mood disorder is not well managed on current medication.  Will address at [facility].  I recommended that his wife, Timothy [name] come to this visit as well if at all possible.

## 2012-11-03 ENCOUNTER — Other Ambulatory Visit: Payer: Self-pay | Admitting: Family Medicine

## 2012-11-04 ENCOUNTER — Ambulatory Visit (INDEPENDENT_AMBULATORY_CARE_PROVIDER_SITE_OTHER): Payer: Medicaid Other | Admitting: Psychology

## 2012-11-04 ENCOUNTER — Encounter: Payer: Self-pay | Admitting: Psychology

## 2012-11-04 DIAGNOSIS — F17200 Nicotine dependence, unspecified, uncomplicated: Secondary | ICD-10-CM

## 2012-11-04 DIAGNOSIS — Z716 Tobacco abuse counseling: Secondary | ICD-10-CM

## 2012-11-04 DIAGNOSIS — Z7189 Other specified counseling: Secondary | ICD-10-CM

## 2012-11-04 DIAGNOSIS — F319 Bipolar disorder, unspecified: Secondary | ICD-10-CM

## 2012-11-04 DIAGNOSIS — F1010 Alcohol abuse, uncomplicated: Secondary | ICD-10-CM

## 2012-11-04 NOTE — Assessment & Plan Note (Signed)
Reports that he is no longer drinking.  Is not interested in [place].

## 2012-11-04 NOTE — Patient Instructions (Addendum)
Please schedule a follow-up for:  May 21st at 9:00 a.m. Please schedule a follow-up in two weeks with Dr. [name].  He will check on how you are tolerating the medicine and if it is helping.  He will also get a Depakote level.   Dr. Kathrynn Running recommended you decrease your Prozac to 40 mg.  So instead of two pills, take one pill starting tomorrow. He also recommends that you start a medication that treats Bipolar Disorder.  Depakote is a good medicine for treating Bipolar Disorder - especially irritability.  It may also help with your headaches. Call me at [phone] with questions or concerns.  A good website to read about Bipolar Disorder is [email or web address]

## 2012-11-04 NOTE — Assessment & Plan Note (Signed)
Smoking 2.5 packs per day.  Not currently interested in quitting.

## 2012-11-04 NOTE — Assessment & Plan Note (Signed)
Report of mood is irritable.  He is anhedonic, "I don't care about anything."  Sleep and appetite is disturbed.  He seemed either mildly sedated or has mild psychomotor retardation.  Insight at first pass seems lacking but when pushed, he is able to come up with some things.  He is likely defensive given substance use issues.    Ritik has attended to some of the substance use issues that were clouding the picture.  He reports he is not drinking alcohol.  He is down to one cup of coffee per day.  He is getting what sounds like more caffeine through [facility] powders.  He is smoking 2.5 ppds.  He reports he took his last pain pill last night.  Continues with diazepam.    Treatment team wanted to work on removing what is likely an offensive agent in the Prozac.  Decreased to 40 mg.  We looked specifically at medicine that might target irritability.  Depakote was chosen.  See patient instructions for further plan.

## 2012-11-04 NOTE — Progress Notes (Signed)
Timothy [name] presents for his initial [facility] appointment.  A thorough evaluation was previously done in [facility].  See note dated 09/10/12.  Our agenda today is to re-establish the diagnosis and look at treatment options.  His wife accompanies him today.  Both state the irritability is the largest symptom that needs management.  Timothy [name] also reports pressured thinking, difficulty with sleep and [name] and impulsive spending.  Timothy [name] recently got in an argument with his boss - someone whom he has never argued with before.    Reviewed recent lab work, problem list and medications.  Current psych include 80 mg of Prozac, Buspar and Diazepam.

## 2012-11-10 ENCOUNTER — Encounter (INDEPENDENT_AMBULATORY_CARE_PROVIDER_SITE_OTHER): Payer: Self-pay | Admitting: General Surgery

## 2012-11-10 ENCOUNTER — Ambulatory Visit (INDEPENDENT_AMBULATORY_CARE_PROVIDER_SITE_OTHER): Payer: Medicaid Other | Admitting: General Surgery

## 2012-11-10 VITALS — BP 120/78 | HR 78 | Temp 97.5°F | Resp 18 | Ht 70.0 in | Wt 169.4 lb

## 2012-11-10 DIAGNOSIS — Z5189 Encounter for other specified aftercare: Secondary | ICD-10-CM

## 2012-11-10 MED ORDER — SULFAMETHOXAZOLE-TRIMETHOPRIM 800-160 MG PO TABS: 1.0000 | ORAL_TABLET | Freq: Two times a day (BID) | ORAL | Status: AC

## 2012-11-10 MED ORDER — HYDROCODONE-ACETAMINOPHEN 5-325 MG PO TABS: 1.0000 | ORAL_TABLET | Freq: Four times a day (QID) | ORAL | Status: DC | PRN

## 2012-11-10 NOTE — Progress Notes (Signed)
Subjective:     Patient ID: Timothy [name], male   DOB: 14-May-1966, 47 y.o.   MRN: [id number]  HPI 47 year old Caucasian male who is several months out from a colostomy reversal by Dr. Felicity Pellegrini comes in complaining of additional new areas of drainage from his midline incision. He has had one open area over the past several months. Now he complains of pain and swelling around his umbilicus. He denies any fevers or chills. He reports a good appetite. He denies any diarrhea or constipation.  Review of Systems     Objective:   Physical Exam  Vitals reviewed. Constitutional: He appears well-developed and well-nourished. No distress.  Abdominal:    Skin: He is not diaphoretic.  BP 120/78  Pulse 78  Temp(Src) 97.5 F (36.4 C) (Temporal)  Resp 18  Ht 5\' 10"  (1.778 m)  Wt 169 lb 6.4 oz (76.839 kg)  BMI 24.31 kg/m2 He has 2 small openings in his upper midline. Each is less than half a centimeter. I was able to probe it with a Q-tip. They didn't really track anywhere. There is no skin cellulitis. There was some small amounts of purulent drainage. Around his umbilicus it had an area of fluctuance about 1-1/2 cm and tenderness and a little bit of erythema. I sharply incised  with a #11 blade and got some pus. 2 the wounds were packed with iodoform gauze    Assessment:     Postop wound infection     Plan:     I told him to keep the area is packed for 48 hours and terminated the packing on Thursday. He was given wound care instructions. He was given a refill on pain medication as well as placed on Bactrim for one week. Follow Dr. Verda Cumins M. Andrey Campanile, MD, FACS General, Bariatric, & Minimally Invasive Surgery [facility], [place]

## 2012-11-10 NOTE — Patient Instructions (Signed)
On Thursday remove white gauze, and pull packing strips out and cover with dry gauze. Change outer gauze daily

## 2012-11-11 ENCOUNTER — Encounter (INDEPENDENT_AMBULATORY_CARE_PROVIDER_SITE_OTHER): Payer: Self-pay | Admitting: General Surgery

## 2012-11-11 ENCOUNTER — Ambulatory Visit (INDEPENDENT_AMBULATORY_CARE_PROVIDER_SITE_OTHER): Payer: Medicaid Other | Admitting: General Surgery

## 2012-11-11 ENCOUNTER — Encounter (INDEPENDENT_AMBULATORY_CARE_PROVIDER_SITE_OTHER): Payer: Self-pay

## 2012-11-11 VITALS — BP 152/80 | HR 76 | Temp 98.9°F | Resp 16 | Ht 70.0 in | Wt 171.0 lb

## 2012-11-11 DIAGNOSIS — Z5189 Encounter for other specified aftercare: Secondary | ICD-10-CM

## 2012-11-11 MED ORDER — DOXYCYCLINE HYCLATE 100 MG PO TABS: 100.0000 mg | ORAL_TABLET | Freq: Two times a day (BID) | ORAL | Status: DC

## 2012-11-11 NOTE — Patient Instructions (Signed)
Shower daily then replace packing in each of three small holes

## 2012-11-17 ENCOUNTER — Encounter (INDEPENDENT_AMBULATORY_CARE_PROVIDER_SITE_OTHER): Payer: Self-pay | Admitting: General Surgery

## 2012-11-17 ENCOUNTER — Ambulatory Visit (INDEPENDENT_AMBULATORY_CARE_PROVIDER_SITE_OTHER): Payer: Medicaid Other | Admitting: General Surgery

## 2012-11-17 VITALS — BP 144/62 | HR 76 | Temp 97.1°F | Resp 16 | Ht 70.0 in | Wt 170.0 lb

## 2012-11-17 DIAGNOSIS — Z5189 Encounter for other specified aftercare: Secondary | ICD-10-CM

## 2012-11-19 ENCOUNTER — Ambulatory Visit (INDEPENDENT_AMBULATORY_CARE_PROVIDER_SITE_OTHER): Payer: Medicaid Other | Admitting: Family Medicine

## 2012-11-19 ENCOUNTER — Encounter: Payer: Self-pay | Admitting: Family Medicine

## 2012-11-19 VITALS — BP 135/80 | HR 84 | Ht 70.0 in | Wt 169.0 lb

## 2012-11-19 DIAGNOSIS — F319 Bipolar disorder, unspecified: Secondary | ICD-10-CM

## 2012-11-19 DIAGNOSIS — F410 Panic disorder [episodic paroxysmal anxiety] without agoraphobia: Secondary | ICD-10-CM

## 2012-11-19 DIAGNOSIS — F411 Generalized anxiety disorder: Secondary | ICD-10-CM

## 2012-11-19 DIAGNOSIS — F419 Anxiety disorder, unspecified: Secondary | ICD-10-CM

## 2012-11-19 MED ORDER — DIAZEPAM 5 MG PO TABS: 5.0000 mg | ORAL_TABLET | Freq: Two times a day (BID) | ORAL | Status: DC | PRN

## 2012-11-19 MED ORDER — DIAZEPAM 5 MG PO TABS: 5.0000 mg | ORAL_TABLET | Freq: Every day | ORAL | Status: DC | PRN

## 2012-11-19 NOTE — Patient Instructions (Signed)
Dear Mr. Hepburn,   Thanks for coming in today.   Please continue taking the Depakote as prescribed, and I will let you know what the level is. Try using the abdominal binder for to see if that helps with the pain. Follow up with me in 4-6 weeks.   Also, I will check your blood pressure again at the next visit.   Good work with the electronic cigarette.   Take Care,   Dr. [name]

## 2012-11-19 NOTE — Progress Notes (Signed)
  Subjective:    Patient ID: Timothy [name], male    DOB: 1965/12/29, 47 y.o.   MRN: [id number]  HPI  47 year old M with a long history of substance abuse and mood disorder, presumed to be Bipolar Type 1. He is currently being followed in mood disorder clinic, and he was started on Depakote 1000 mg daily and had Prozac decreased to 40 mg daily. He is also being treated for anxiety with diazepam 5 mg BID (60 tabs with 1 refill on 10/14/12) and buspirone 15 mg TID PRN (last filled 11/03/12). Currently, the patient notes that since starting the Depakote he feel relatively unchanged. He denies improvement in mood. He also denies worsening of mood. He still had difficulty sleeping at night. He is not currently drinking alcohol and trying to use an electronic cigarette. He is using 1-2 diazepam daily for sleep and anxiety as well as intermittent Buspar.    Abdominal Wound Infection - Reverse Hartman's pouch in January 2014. Seen recently by general surgeon for wound infection and given Rx for oxycodone last week. Notes that he is currently packing the wound daily. He has 2 defects in the healed wound. CT scan planned for next week. Patient was working 1/2 days, but cannot work given abdominal pain at this time.   Social - Patient accompanied by son to visit   Review of Systems Intermittent left posterior shoulder pain; Otherwise negative      Objective:   Physical Exam BP 153/92  Pulse 84  Ht 5\' 10"  (1.778 m)  Wt 169 lb (76.658 kg)  BMI 24.25 kg/m2 Gen: middle aged WM, non distressed, pleasant and conversant  Abd: 2 circular defects < 1 cm without active drainage or fluctuance in otherwise well healed vertical midline wound  Psych: normal speech and thought content, dynamic affect much improved from previous visits      Assessment & Plan:

## 2012-11-20 NOTE — Assessment & Plan Note (Signed)
Patient does not notice improvement since starting Depakote. Will check level. Patient planning to follow up in mood disorder clinic in May. Cont Depakote 1000 mg and fluoxetine 40 mg daily for now.

## 2012-11-20 NOTE — Assessment & Plan Note (Signed)
Patient still with anxiety. Starting to wean diazepam with goal of once a day with 10 extra per 30 days for breakthrough anxiety. Therefore, given Rx for #40 5 mg tabs with 1 refill.

## 2012-11-23 ENCOUNTER — Ambulatory Visit
Admission: RE | Admit: 2012-11-23 | Discharge: 2012-11-23 | Disposition: A | Discharge status: Home / Self Care | Payer: Medicaid Other | Source: Ambulatory Visit | Attending: General Surgery | Admitting: General Surgery

## 2012-11-23 MED ORDER — IOHEXOL 300 MG/ML  SOLN
100.0000 mL | Freq: Once | INTRAMUSCULAR | Status: AC | PRN
  Administered 2012-11-23: 100 mL via INTRAVENOUS

## 2012-11-25 ENCOUNTER — Telehealth: Payer: Self-pay | Admitting: Family Medicine

## 2012-11-25 NOTE — Telephone Encounter (Signed)
Called Timothy [name] to let him know the read of the abdominal CT scan. I stated that there is no evidence of an abscess, but he should still follow the management plan for Dr. Carolynne Edouard. He will complete his course of doxycycline.

## 2012-11-26 ENCOUNTER — Telehealth: Payer: Self-pay | Admitting: Family Medicine

## 2012-11-26 DIAGNOSIS — F319 Bipolar disorder, unspecified: Secondary | ICD-10-CM

## 2012-11-26 NOTE — Telephone Encounter (Signed)
Patient contacted me by cell phone to ask me to call him. I returned his call. He notified me that he had an incident in the parking lot of [employer] today where he and woman got into an argument after she cursed at him. It was a verbal disagreement and no one contact the police. He feels like he previously never would have been bothered by this woman's comments, but not his mood is always irritated and unpredictable. He is asking for recommendations for treatment of his mood disorder. I emphasized that he needs to continue to recognize that his mood disorder is causing these symptoms and that coping strategies are necessary not matter the situation. He also may need titration of his Depakote. I told him that I would notify Dr. Pascal Lux on his behalf and that this will be address at his upcoming appointment. I also instructed him to call the after-hours line for any future inquiries. He is in agreement with this plan.

## 2012-11-27 ENCOUNTER — Telehealth: Payer: Self-pay | Admitting: Psychology

## 2012-11-27 NOTE — Addendum Note (Signed)
Addended by: Spero Geralds E on: 11/27/2012 10:05 AM   Modules accepted: Orders

## 2012-11-27 NOTE — Assessment & Plan Note (Signed)
See phone note documentation.  Symptoms are not well managed at current level of Depakote.  Dr. Kathrynn Running recommended an increase with a level check in one week.

## 2012-11-27 NOTE — Telephone Encounter (Signed)
Duplicate phone note.

## 2012-11-27 NOTE — Telephone Encounter (Signed)
He is not scheduled for [facility] follow-up until May 21st.  Called him to discuss whether he wants to come in sooner to talk about coping strategies.  We scheduled for Monday, May 12th at 3:00.  I will talk with Dr. Kathrynn Running about Haylen's Depakote level.  On 1000 mg of DepakoteI he was 40.4.  He is also still taking 40 mg of Prozac.  He says he is "very short fused" and wants to sleep all of the time.  I will give him Dr. Broadus John feedback when I see Nashid on Monday.

## 2012-11-27 NOTE — Telephone Encounter (Signed)
Consulted with Dr. Kathrynn Running about Jhamal's symptoms and his Depakote level.  Dr. Kathrynn Running recommended Jeannett Senior increase his Depakote to 1500 mg and redraw the level in one week.  I will call Daveion today and let him know this recommendation.  If he increases soon, he can get a level drawn and we can have that information for treatment planning on the 21st.  Will change his med list and order the lab per Dr. Broadus John recommendation.

## 2012-11-30 ENCOUNTER — Ambulatory Visit (INDEPENDENT_AMBULATORY_CARE_PROVIDER_SITE_OTHER): Payer: Medicaid Other | Admitting: Psychology

## 2012-11-30 ENCOUNTER — Telehealth: Payer: Self-pay | Admitting: Psychology

## 2012-11-30 DIAGNOSIS — F319 Bipolar disorder, unspecified: Secondary | ICD-10-CM

## 2012-11-30 NOTE — Progress Notes (Signed)
Subjective:     Patient ID: Timothy [name], male   DOB: 1965-10-18, 47 y.o.   MRN: [id number]  HPI The patient is a 47 year old white male who is about 4 months status post colostomy reversal. His postoperative course was complicated by a small superficial wound infection. Over the last couple weeks the wound which was pretty much healed has opened back up a little bit along the midportion of the incision. There is very little drainage. He denies any fevers or chills. He is having a lot of abdominal pain. His bowels are moving regularly with MiraLAX.  Review of Systems  Constitutional: Positive for activity change.  HENT: Negative.   Eyes: Negative.   Respiratory: Negative.   Cardiovascular: Negative.   Gastrointestinal: Positive for abdominal pain.  Endocrine: Negative.   Genitourinary: Negative.   Musculoskeletal: Negative.   Skin: Negative.   Allergic/Immunologic: Negative.   Neurological: Negative.   Hematological: Negative.   Psychiatric/Behavioral: Negative.        Objective:   Physical Exam  Constitutional: He is oriented to person, place, and time. He appears well-developed and well-nourished.  HENT:  Head: Normocephalic and atraumatic.  Eyes: Conjunctivae and EOM are normal. Pupils are equal, round, and reactive to light.  Neck: Normal range of motion. Neck supple.  Cardiovascular: Normal rate, regular rhythm and normal heart sounds.   Pulmonary/Chest: Effort normal and breath sounds normal.  Abdominal: Soft. Bowel sounds are normal. There is tenderness.  Musculoskeletal: Normal range of motion.  Neurological: He is alert and oriented to person, place, and time.  Skin: Skin is warm and dry.  Psychiatric: He has a normal mood and affect. His behavior is normal.       Assessment:     The patient is 4 months status post colostomy reversal. He still has a small opening to the midportion of the incision but it is clean and appears to be healing. He continues to complain  of abdominal pain. At this point I would recommend getting a CT scan of his abdomen and pelvis to make sure he does not have a complication from the surgery.     Plan:     Plan for CT of the abdomen and pelvis. If this scan is negative we will plan to see him back in about a month to check his progress

## 2012-11-30 NOTE — Telephone Encounter (Signed)
Timothy [name] called to report he is about out of Depakote since he increased his dose.  Consulted Dr. Kathrynn Running who authorized a script called into [facility] in [place].  Divalproex 500 mg #90 with no refills.  Take three pills daily with food.

## 2012-11-30 NOTE — Assessment & Plan Note (Signed)
CBT approach was used.  Unfortunately, Timothy [name]'s awareness of how he is feeling is incredibly low and his ability to think through his emotions is just as low if not lower.  Tried to do some psychoeducation around emotional awareness - specifically the idea that underneath his anger is most likely feelings of hurt and fear.  He is very concrete in his thinking and this approach was not successful.  Looked to a more behavioral approach.  See patient instructions for further plan.    Worked with Timothy [name] in terms of educating about the dynamic of this relationship.  If Timothy [name] is making changes, Timothy [name] is going to need to change in order to accommodate them.  Timothy [name] has long been a "doormat" and has not demanded that respectful communication be a part of this relationship, despite the negative impact it has on her.  In addition to setting healthier boundaries around this, Timothy [name] is to work on not "parenting" Timothy [name] like a child.  He tends to feel angry when he perceives this and behaviorally, acts out (like a child) which tends to prompt more parenting from her.  We did not detail the plan on this second piece nearly as much.  Tried to address just one thing but it is difficult to focus them in helpful ways.  Of note:  There does seem to be some shift toward the positive with the medication.  Small but noticeable.  We will see if we can capitalize with some behavioral changes. Scheduled a follow-up for beh med on May 27th at 10:00.

## 2012-11-30 NOTE — Patient Instructions (Addendum)
Timothy [name] agreed to cue Timothy [name] when she feels disrespected with his choice of language.  She will use language like:  I feel disrespected when you speak to me that way.  I feel hurt when you speak to me that way.  I can't hear you when you talk to me that way.  If he doesn't change his language, she agreed to walk away and listen to him when he is able to treat her with respect. Timothy [name] you agreed to work on resetting with the above cue from [place].  If Timothy [name] is really angry, he agreed to leave the house rather than curse Timothy [name].  He will leave alone so Timothy [name] doesn't have to worry about the safety of the children. I suspect Timothy [name] will start to feel more loving and kind the more he expresses his thoughts in loving ways.   Consider a gratitude project.  Each day, take a moment to tell each family member one thing that you are grateful for.
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
## 2012-12-01 NOTE — Progress Notes (Signed)
Subjective:     Patient ID: Timothy [name], male   DOB: July 30, 1965, 47 y.o.   MRN: [id number]  HPI The patient is a 47 year old white male who is almost 4 months status post colostomy takedown. His postoperative course was complicated by a small superficial wound infection. This appeared to be completely healed the last few days he has developed some drainage from the midportion of the incision. He complains of a lot of abdominal pain. He denies any fevers or chills. His appetite has been improving he has been gaining weight.  Review of Systems  Constitutional: Negative.   HENT: Negative.   Eyes: Negative.   Respiratory: Negative.   Cardiovascular: Negative.   Gastrointestinal: Positive for abdominal pain.  Endocrine: Negative.   Genitourinary: Negative.   Musculoskeletal: Negative.   Skin: Negative.   Allergic/Immunologic: Negative.   Neurological: Negative.   Hematological: Negative.   Psychiatric/Behavioral: Negative.        Objective:   Physical Exam  Constitutional: He is oriented to person, place, and time. He appears well-developed and well-nourished.  HENT:  Head: Normocephalic and atraumatic.  Eyes: Conjunctivae and EOM are normal. Pupils are equal, round, and reactive to light.  Neck: Normal range of motion. Neck supple.  Cardiovascular: Normal rate, regular rhythm and normal heart sounds.   Pulmonary/Chest: Effort normal and breath sounds normal.  Abdominal: Soft. Bowel sounds are normal.  The midportion of the incision has opened slightly. The base of the wound looks clean. Some of the PDS stitching was removed without difficulty. There is no tunneling.  Musculoskeletal: Normal range of motion.  Neurological: He is alert and oriented to person, place, and time.  Skin: Skin is warm and dry.  Psychiatric: He has a normal mood and affect. His behavior is normal.       Assessment:     The patient is almost 4 months status post colostomy takedown with some persistent  open wound    Plan:     At this point he will continue to keep the wound clean and dress it daily. I have offered to obtain a CT scan of his abdomen and pelvis to make sure he does not have a complication from surgery. We will plan to see him back in the next 2-3 weeks to check his wound again.

## 2012-12-04 ENCOUNTER — Other Ambulatory Visit: Payer: Medicaid Other

## 2012-12-04 ENCOUNTER — Telehealth (INDEPENDENT_AMBULATORY_CARE_PROVIDER_SITE_OTHER): Payer: Self-pay | Admitting: *Deleted

## 2012-12-04 DIAGNOSIS — F319 Bipolar disorder, unspecified: Secondary | ICD-10-CM

## 2012-12-04 NOTE — Progress Notes (Signed)
VALPORIC ACID DONE TODAY Timothy [name]

## 2012-12-04 NOTE — Telephone Encounter (Signed)
Spoke to [place] MD who approved patient to have Norco 5/325mg  Take 1 tablet every 6 hours as needed for pain #30 no refills.  [facility] [phone].  Patient updated at this time.

## 2012-12-04 NOTE — Telephone Encounter (Signed)
Routing to [place] MD.  Have not heard back at this time.

## 2012-12-04 NOTE — Telephone Encounter (Signed)
Patient called to state that he feels like his incision site is going to come open again and what he needs to do.  The incision site at this time is not actually open, denies drainage, denies redness.  Patient just states it is starting to hurt again.  Patient's appt moved to an earlier date to see Carolynne Edouard MD since the incision site is not actually opened at this time and no signs of infection.  Patient is also asking for a refill of pain medication.  This RN will page Carolynne Edouard MD and ask for approval.

## 2012-12-08 ENCOUNTER — Encounter (INDEPENDENT_AMBULATORY_CARE_PROVIDER_SITE_OTHER): Payer: Self-pay | Admitting: General Surgery

## 2012-12-08 ENCOUNTER — Ambulatory Visit (INDEPENDENT_AMBULATORY_CARE_PROVIDER_SITE_OTHER): Payer: Medicaid Other | Admitting: General Surgery

## 2012-12-08 VITALS — BP 134/80 | HR 74 | Temp 98.0°F | Resp 18 | Ht 70.0 in | Wt 173.0 lb

## 2012-12-08 DIAGNOSIS — Z5189 Encounter for other specified aftercare: Secondary | ICD-10-CM

## 2012-12-08 MED ORDER — DOXYCYCLINE HYCLATE 100 MG PO TABS: 100.0000 mg | ORAL_TABLET | Freq: Two times a day (BID) | ORAL | Status: DC

## 2012-12-08 NOTE — Progress Notes (Signed)
Subjective:     Patient ID: Timothy [name], male   DOB: Jun 08, 1966, 47 y.o.   MRN: [id number]  HPI The patient is a 47 year old white male who is about 5 months status post colostomy reversal. His postoperative course was complicated by a small superficial wound infection. He states that over the last couple days he is really does not felt well. He is not running a fever. He denies any nausea or vomiting. His bowels are moving regularly. He is very tender along the incision and has had a new small area open up.  Review of Systems     Objective:   Physical Exam On exam his abdomen is soft with minimal tenderness. His midline incision is almost completely healed except for one small area just above the umbilicus. I was able to probe this area and get another small piece of stitch out. There is no tunneling. There is no purulence.    Assessment:     The patient is 5 months status post colostomy reversal with some persistent wound infection that is very superficial. He states that taking doxycycline for a couple weeks in the past almost completely cleared these areas up and that is when he felt the best.     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 He will continue local wound care. I will plan to put him on doxycycline for the next 30 days and see how he does. We will plan to see him back in one month

## 2012-12-08 NOTE — Patient Instructions (Signed)
Continue to clean wound daily Start doxycycline for 1 month

## 2012-12-09 ENCOUNTER — Telehealth: Payer: Self-pay | Admitting: Family Medicine

## 2012-12-09 ENCOUNTER — Ambulatory Visit (INDEPENDENT_AMBULATORY_CARE_PROVIDER_SITE_OTHER): Payer: Medicaid Other | Admitting: Psychology

## 2012-12-09 DIAGNOSIS — F319 Bipolar disorder, unspecified: Secondary | ICD-10-CM

## 2012-12-09 NOTE — Telephone Encounter (Signed)
Pt wanted Dr. [name] to be aware of the fact he saw Dr. Pascal Lux today and his surgeon yesterday and is working on completing his SSI/disability forms.  I reminded and encouraged patient to keep appt next week with Dr. Pascal Lux and appt 12/29/12 with Dr. [name].  Pt. Agreed.  Woodward Klem, Darlyne Russian, CMA

## 2012-12-09 NOTE — Progress Notes (Signed)
Timothy [name] presents for follow-up.  He is taking 1500 mg of Depakote, 40 mg of Prozac, 2.5 mg of Valium in the morning (as needed) and 5 mg at night and Buspar 15 mg in the morning and 15 mg at night.  He is also taking pain medicine (Norco) for his stomach issues.  Report of mood last week - he was "pissed off" a couple of days for part of the day.  He awoke that way.  Denies significant fear or worry.    He is applying for disability.

## 2012-12-09 NOTE — Patient Instructions (Addendum)
Please schedule a follow up for June 25th at 10:45. Dr. Kathrynn Running recommended you stop the Prozac all together.  Call me at [phone] if you have any difficulty with this. Keep the Depakote the same at 1500 mg.  Your level was 55.7 which is in the therapeutic range.   I'll see you next week to continue talking about feelings.

## 2012-12-09 NOTE — Telephone Encounter (Signed)
Patient is calling to let Dr. [name] know that he has been to the other two clinics and he would like to speak to Dr. [name] about how those visits went.

## 2012-12-09 NOTE — Telephone Encounter (Signed)
Left message for patient and made him aware that I saw the notes of the previous two doctors and agree with management plans.

## 2012-12-09 NOTE — Assessment & Plan Note (Signed)
Report of mood is irritable.  Affect is consistent.  He seems especially angry with his wife (she is not here today).  Treatment team suspects there is a great deal of sadness under his anger but he either isn't aware of it, has no language for it or does not feel safe sharing it.  Will plan to explore further in his follow-up therapy appointment.  Dr. Kathrynn Running recommended that he stop the Prozac all together with the thought that it might be making things worse.  With the long half-life, he should be able to tolerate stopping it.  Will keep other medications the same for now.    See patient instructions for further plan.

## 2012-12-11 ENCOUNTER — Telehealth: Payer: Self-pay | Admitting: Psychology

## 2012-12-11 ENCOUNTER — Telehealth: Payer: Self-pay | Admitting: Family Medicine

## 2012-12-11 NOTE — Telephone Encounter (Signed)
Spoke with pharmacist at [facility].  have authorized an early refill since it is 2 days early and Mr. Tangeman was confused on the prescription. Directions on the Rx needed to say 1-2 tablets daily in order for medicaid to cover this prescription, therefore the directions have been changed.  Para March, can you please call Timothy [name] to let him know the early refill has been approved. He should now taper down his medication as discussed with Dr. [name] at his last visit. He should only take ONE tablet daily. He can break this in half to make it last longer, if he wants to. He will have an additional 10 tabs per month to use in case he needs to but he should try to take only one per day.  Thank you, Hani Campusano M. Novalee Horsfall, M.D.

## 2012-12-11 NOTE — Telephone Encounter (Signed)
Dr. [name] not in office today.  Will route request to Dr. Mikel Cella and call patient back.   Gaylene Brooks, RN

## 2012-12-11 NOTE — Telephone Encounter (Signed)
Pt is having trouble getting his valium because he didn't cut it back because he thought that he was supposed to do that at this next refill which is due Sunday - is now out and needs a few to cover him until Sunday.  pls advise  [facility]

## 2012-12-11 NOTE — Telephone Encounter (Signed)
Timothy [name] called Thursday to report that he was feeling shaky and wondered whether it was because he had stopped his Prozac.  He had tapered down to 40 mg and at [facility] on Wednesday, Dr. Kathrynn Running recommended he stop the medicine all together.  This was his first day without it.  Given the long half-life, I do not think his shakiness is related to stopping the Prozac.  Discussed possibility of going from 40 to 20 mg if he thought that might be better.  He said he will give it one more day and let me know tomorrow.  Will consult with Dr. Kathrynn Running to make sure there isn't anything else to consider.

## 2012-12-11 NOTE — Telephone Encounter (Signed)
Returned call to patient.  Informed of med refill and needs to take one tablet daily since his dose is being tapered down.  Patient agreeable and verbalized understanding.  Will discuss with Dr. [name] at his follow-up appt next month.   Gaylene Brooks, RN

## 2012-12-12 ENCOUNTER — Telehealth: Payer: Self-pay | Admitting: Family Medicine

## 2012-12-12 NOTE — Telephone Encounter (Signed)
Received call from patient that he was experiencing nausea. Has tried nausea pills prescribed to him and these have been helping some.  He does have a history of fairly recent abdominal surgery and followed up with his surgeon this week.   He is not vomiting and his bowels are moving well.  He denies fever.  Advised to continue what he was doing and take nausea medication as needed.  If unable to keep any food down he come to ED but if tolerating PO can schedule with our office on Tuesday.  He is agreeable to this.

## 2012-12-15 ENCOUNTER — Ambulatory Visit (INDEPENDENT_AMBULATORY_CARE_PROVIDER_SITE_OTHER): Payer: Medicaid Other | Admitting: Psychology

## 2012-12-15 ENCOUNTER — Ambulatory Visit: Payer: Medicaid Other | Admitting: Family Medicine

## 2012-12-15 ENCOUNTER — Encounter (INDEPENDENT_AMBULATORY_CARE_PROVIDER_SITE_OTHER): Payer: Self-pay | Admitting: General Surgery

## 2012-12-15 ENCOUNTER — Telehealth (INDEPENDENT_AMBULATORY_CARE_PROVIDER_SITE_OTHER): Payer: Self-pay | Admitting: General Surgery

## 2012-12-15 DIAGNOSIS — F319 Bipolar disorder, unspecified: Secondary | ICD-10-CM

## 2012-12-15 NOTE — Telephone Encounter (Signed)
Patient calling for a refill of his hydrocodone 5/325 and to let us know his wound is not closing. Please advise. [phone].

## 2012-12-15 NOTE — Patient Instructions (Addendum)
Please come see [place] in the [facility] on:  June 25th at 10:45. Please schedule a follow-up with me on June 16th at 9:00. You said you are having trouble with the Depakote and decided to go down to two pills a day to see if it feels better.  If you are still having trouble with two pills a day, you can stop the medicine and we will take another look at your [facility] appointment. We talked about putting structure in your day so you can have more of a routine.  Scheduling your day to include periods of rest and activity might feel better than taking 4-6 hour naps during the day or watching television.  It can be difficult to schedule a day when there isn't anything you have to do but it can be worth it in terms of your mood and overall health.

## 2012-12-15 NOTE — Progress Notes (Signed)
Timothy [name] presents for follow-up.  He reports he does not like how he feels on the Depakote.  He thinks he is more forgetful and wants to sleep all the time.  Additionally, he is nauseous a great deal and thinks this is at least partially related to the Depakote.  He increased his dose around 5/8 and presented for [facility] on 5/21 not reporting the above issues.    He states his current routine is to go to bed around 9-10 and get up around 4:30 or 5:00.  He gets the kids off to school and might go back to sleep around 10 am and sleep until some time between 2 and 4.  Daily he cleans up dishes and does some laundry.  Watches television too.  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  He states her family is very wealthy and he has interpersonal challenges with her father.

## 2012-12-15 NOTE — Assessment & Plan Note (Signed)
Report of mood remains irritable.  He looks a little less irritable today but the tension between he and his wife is still noticeable.  He sounds as though he has already left the marriage.  I wonder if this is a function of him rejecting before he gets rejected or whether he he doesn't think the relationship is a good match.    Not sure what to make of his report of difficulty with Depakote.  He was not reporting these issues in the [facility].  Consulted with Dr. Kathrynn Running who wondered the same thing.  Will follow up with [name] by phone.  We doubt the symptoms are related to stopping the Prozac but they are temporally associated.  I have concluded that cognitive work is not helpful / possible.  Whether it is baseline, secondary to years of drug and alcohol abuse or some other factor, he can't seem to take advantage of this.  Focused again on behavior including creating structure.  He is missing a great deal of social interaction now that he is no longer working and this is likely hard as well.  He identified making bird houses as a potential hobby.

## 2012-12-16 ENCOUNTER — Telehealth: Payer: Self-pay | Admitting: Psychology

## 2012-12-16 ENCOUNTER — Telehealth (INDEPENDENT_AMBULATORY_CARE_PROVIDER_SITE_OTHER): Payer: Self-pay

## 2012-12-16 ENCOUNTER — Encounter (INDEPENDENT_AMBULATORY_CARE_PROVIDER_SITE_OTHER): Payer: Medicaid Other | Admitting: General Surgery

## 2012-12-16 NOTE — Telephone Encounter (Signed)
Pt calling to see if Dr Carolynne Edouard has reviewed his request for refill hydrocodone 5/325. I noted response to wound care yesterday but no note re: refill request. Pt advise I will send request to Dr Carolynne Edouard and his assistant. Pt can be reached [phone].

## 2012-12-16 NOTE — Telephone Encounter (Signed)
He can have 20 more pills but no more after that. He will need to go back to medical doc for long term pain management

## 2012-12-16 NOTE — Telephone Encounter (Signed)
LMOM to call back. Dr Carolynne Edouard is not refilling pain medicine at this time. He can take something OTC.

## 2012-12-16 NOTE — Telephone Encounter (Signed)
Per note from Dr Carolynne Edouard at 1:28pm refill for hydrocodone 5/325 #20 called to [facility] at [facility].

## 2012-12-16 NOTE — Telephone Encounter (Signed)
Patient called back and was given below message.  Patient states understanding to take Tylenol OTC to help his pain.

## 2012-12-16 NOTE — Telephone Encounter (Signed)
After consulting with Dr. Kathrynn Running, I called Timothy [name] to discuss further the difficulty he is having with the Depakote.  It sounds like the difficulty he had started after he stopped the Prozac rather than when he increased the Depakote.  Dr. Kathrynn Running nor I thought he would / should have difficulty coming off the prozac, especially that quickly but it is possible especially given the interplay between psychological and physiological factors.  Timothy [name] took 1000 mg of Depakote this morning and feels better on this dose.  Elected to keep it at 1000 mg and give him time to clear the prozac completely and then we will reassess.  Of note - he says that he is not using his Buspar and is only using Valium when he is "really stressed."  He endorsed my reflection that while his irritability remains quite high (and consistent), his anxiety seems to be better.    Follow-up as scheduled.

## 2012-12-16 NOTE — Telephone Encounter (Signed)
He should continue to keep it clean and covered. Take the abx

## 2012-12-16 NOTE — Telephone Encounter (Signed)
Per Dr Carolynne Edouard he has decided not to refill hydrocodone. [facility] pharmacy notified not to refill hydrocodone. Pharmacist has not yet completed refill and will cx refill.  Pt can f/u with his pcp for future pain med refills.

## 2012-12-17 ENCOUNTER — Ambulatory Visit: Payer: Medicaid Other | Admitting: Family Medicine

## 2012-12-21 ENCOUNTER — Telehealth: Payer: Self-pay | Admitting: Family Medicine

## 2012-12-21 NOTE — Telephone Encounter (Signed)
Patient has an appt on 6/10 and is asking for enough of his pain meds to last until that appt to be sent to [facility] in [place].

## 2012-12-21 NOTE — Telephone Encounter (Signed)
I have reviewed his chart (Dr. [name] is on vacation this week) and he has been getting Norco from his surgeon for pain related to his abdominal incision.  There is a note from his surgeon on 5/28 stating that he will no longer refill the pain medication and he should follow up with his PCP for long term pain management.  It appears that Timothy [name] has a history of substance abuse and Dr. [name] is working to get him off of Valium.  I will NOT provide Norco tablets until he has been assessed at our office to determine if they are needed.  If his pain is severe, he needs to be evaluated either at our office via SDA or in the ED.  In the meantime, he can alternate tylenol 1g and ibuprofen 800mg  every 4 hours.

## 2012-12-21 NOTE — Telephone Encounter (Signed)
Will fwd to Md.  John Vasconcelos L, CMA  

## 2012-12-22 NOTE — Telephone Encounter (Signed)
Pt notified, see note below and reminded of OV with Dr. [name] on 12/29/2012 at 9:45am.  Radene Ou, CMA

## 2012-12-24 ENCOUNTER — Telehealth: Payer: Self-pay | Admitting: Psychology

## 2012-12-24 NOTE — Telephone Encounter (Signed)
Timothy [name] called and left a VM.  I returned his call and left a VM. 

## 2012-12-29 ENCOUNTER — Ambulatory Visit (INDEPENDENT_AMBULATORY_CARE_PROVIDER_SITE_OTHER): Payer: Medicaid Other | Admitting: Family Medicine

## 2012-12-29 ENCOUNTER — Telehealth: Payer: Self-pay | Admitting: Family Medicine

## 2012-12-29 VITALS — BP 136/80 | HR 59 | Temp 97.8°F | Ht 70.0 in | Wt 177.0 lb

## 2012-12-29 DIAGNOSIS — R112 Nausea with vomiting, unspecified: Secondary | ICD-10-CM

## 2012-12-29 DIAGNOSIS — R1013 Epigastric pain: Secondary | ICD-10-CM

## 2012-12-29 DIAGNOSIS — R2681 Unsteadiness on feet: Secondary | ICD-10-CM

## 2012-12-29 DIAGNOSIS — R42 Dizziness and giddiness: Secondary | ICD-10-CM

## 2012-12-29 DIAGNOSIS — IMO0002 Reserved for concepts with insufficient information to code with codable children: Secondary | ICD-10-CM

## 2012-12-29 DIAGNOSIS — Z8719 Personal history of other diseases of the digestive system: Secondary | ICD-10-CM

## 2012-12-29 DIAGNOSIS — R269 Unspecified abnormalities of gait and mobility: Secondary | ICD-10-CM

## 2012-12-29 MED ORDER — HYDROCODONE-ACETAMINOPHEN 5-325 MG PO TABS: 1.0000 | ORAL_TABLET | Freq: Two times a day (BID) | ORAL | Status: DC | PRN

## 2012-12-29 MED ORDER — METOCLOPRAMIDE HCL 10 MG PO TABS: 10.0000 mg | ORAL_TABLET | Freq: Every day | ORAL | Status: DC | PRN

## 2012-12-29 NOTE — Telephone Encounter (Signed)
I spoke to Timothy [name] and informed him that his Depakote level is has not yet resulted but to cut back to 500 mg a day to see if the symptoms improve. He is agreeable to this.

## 2012-12-29 NOTE — Telephone Encounter (Signed)
Will fwd to MD.  Davey Bergsma L, CMA  

## 2012-12-29 NOTE — Patient Instructions (Signed)
Timothy [name],   You are going through a lot right now and I will try to get you through it.   1. Nausea and Vomiting - Stop ibuprofen, start Reglan as needed  2. Falls - I'm concerned that this may be due to the depakote. I will check another level today. Tomorrow only take 500 mg unless I call and tell you otherwise. I will get up with Dr. Pascal Lux about this.   3. Pain - Take the Norco as needed twice a day.   Follow up in 1 week.   Take Care,   Dr. [name]

## 2012-12-29 NOTE — Telephone Encounter (Signed)
Patient seen the morning. Patient calls in because he states he is expecting a call from Dr. [name] pertaining to his Depakote dosage.

## 2012-12-29 NOTE — Progress Notes (Signed)
Subjective:    Patient ID: Timothy [name], male    DOB: 09-05-1965, 47 y.o.   MRN: [id number]  HPI  47 year old M with bipolar disorder, tobacco abuse, and abdominal pain s/p bowel resection and reanastomosis 07/23/12. He presents today with numerous complaints.   1. Dizziness/Falling/Feeling "Drunk" - Feels like he is losing his balance and stumbles around the house, his wife has to catch him; Has several falls due to "tripping", denies LOC; This started 3 weeks ago, he feels like it is related to Depakote. He is currently taking 1000 mg of depakote daily. He is not taking Prozac at all. He is also taking diazepam once daily for anxiety. He denies any alcohol use.    2. 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 Carolynne Edouard; however, Dr. Carolynne Edouard recommended that he follow up with PCP for pain management; patient also wondering if he could have nausea medication, previously taking Reglan; Also, patient still with small defect in the incision with mild drainage that he packs daily and still taking doxycycline for potential MRSA infection,   Review of Systems Positive for pain around genitals caused by cream (name unknown) he is using to treatgenital herpes, being treated for this issues by urologist; positive for tremulousness     Objective:   Physical Exam  BP 136/80  Pulse 59  Temp(Src) 97.8 F (36.6 C)  Ht 5\' 10"  (1.778 m)  Wt 177 lb (80.287 kg)  BMI 25.4 kg/m2  Gen: mildly ill appearing middle aged WM, non distressed, new abdominal distension noticeable through shirt HEENT: NCAT, PERRLA CV: RRR, no murmurs Pulm: Clear bilaterally Abd: distended with marked protuberance bilaterally that was not  previously noted, non tender, normal bowel sounds, large anterior midline incision well healed except for 0.5 cm x 1 cm defect in the center of the scar without tenderness, erythema, or purulent drainage  Neuro: CN II-XII grossly intact, no horizontal or vertical nystagmus, slowed cerebellar function testing, normal gait, general tremulousness  Skin: marked xerosis of his hands Genitials: numerous healing genital warts on base of penis with erythematous, raw appearing skin on base of penis and dorsal shaft     Assessment & Plan:  47 year old M with very complicated medical situation with continued complications after abdominal procedure in January and poorly controlled mood disorder now numerous physical symptoms that could be due to medications and/or a systemic process.   Falls and Unsteady Gait - This could be related to the Depakote, although his level was normal in May it is reasonable to check another one. Decrease dose to 500 mg daily for now. I will consult with his psychologist about this issues. Given the lack of dizziness, I do not suspect any vestibular issue. Less likely, but not impossible would be a cerebellar CVA. However, without any focal findings on my exam today, I do not believe that further imaging is warranted for this.   Abdominal Pain/Nausea/Distention - I am very concerned about the appearance of his abdomen as it is markedly more protuberant than all previous exam. His pain in likely related to adhesions and scarring as well as potential defects in the abdominal wall. The distension could be due to herniation or  gaseous distension caused by small bowel obstruction. The patient is not currently nausea, so no abdominal imaging will be ordered. I will give a refill of reglan to take PRN for nausea since it has been effective for him in the past. For his abdominal pain, I will give him Norco 5-325 # 60. He was informed of risks of reglan and opioids. He will use stool softeners as  needed. This is a complex situation with his mood disorder, pain, incision, defect, and falls. He will need close follow up within one week.

## 2012-12-30 ENCOUNTER — Emergency Department (HOSPITAL_COMMUNITY)
Admission: EM | Admit: 2012-12-30 | Discharge: 2012-12-31 | Disposition: A | Discharge status: Home / Self Care | Payer: Medicaid Other | Attending: Emergency Medicine | Admitting: Emergency Medicine

## 2012-12-30 ENCOUNTER — Encounter (HOSPITAL_COMMUNITY): Payer: Self-pay | Admitting: Emergency Medicine

## 2012-12-30 ENCOUNTER — Telehealth: Payer: Self-pay | Admitting: Family Medicine

## 2012-12-30 ENCOUNTER — Encounter: Payer: Self-pay | Admitting: Family Medicine

## 2012-12-30 DIAGNOSIS — F3289 Other specified depressive episodes: Secondary | ICD-10-CM | POA: Insufficient documentation

## 2012-12-30 DIAGNOSIS — Z933 Colostomy status: Secondary | ICD-10-CM | POA: Insufficient documentation

## 2012-12-30 DIAGNOSIS — Z872 Personal history of diseases of the skin and subcutaneous tissue: Secondary | ICD-10-CM | POA: Insufficient documentation

## 2012-12-30 DIAGNOSIS — K219 Gastro-esophageal reflux disease without esophagitis: Secondary | ICD-10-CM | POA: Insufficient documentation

## 2012-12-30 DIAGNOSIS — Z8669 Personal history of other diseases of the nervous system and sense organs: Secondary | ICD-10-CM | POA: Insufficient documentation

## 2012-12-30 DIAGNOSIS — Z8719 Personal history of other diseases of the digestive system: Secondary | ICD-10-CM | POA: Insufficient documentation

## 2012-12-30 DIAGNOSIS — Z8639 Personal history of other endocrine, nutritional and metabolic disease: Secondary | ICD-10-CM | POA: Insufficient documentation

## 2012-12-30 DIAGNOSIS — R109 Unspecified abdominal pain: Secondary | ICD-10-CM

## 2012-12-30 DIAGNOSIS — G43909 Migraine, unspecified, not intractable, without status migrainosus: Secondary | ICD-10-CM | POA: Insufficient documentation

## 2012-12-30 DIAGNOSIS — Z8619 Personal history of other infectious and parasitic diseases: Secondary | ICD-10-CM | POA: Insufficient documentation

## 2012-12-30 DIAGNOSIS — F17200 Nicotine dependence, unspecified, uncomplicated: Secondary | ICD-10-CM | POA: Insufficient documentation

## 2012-12-30 DIAGNOSIS — K5900 Constipation, unspecified: Secondary | ICD-10-CM | POA: Insufficient documentation

## 2012-12-30 DIAGNOSIS — R143 Flatulence: Secondary | ICD-10-CM | POA: Insufficient documentation

## 2012-12-30 DIAGNOSIS — R1013 Epigastric pain: Secondary | ICD-10-CM | POA: Insufficient documentation

## 2012-12-30 DIAGNOSIS — R141 Gas pain: Secondary | ICD-10-CM | POA: Insufficient documentation

## 2012-12-30 DIAGNOSIS — Z862 Personal history of diseases of the blood and blood-forming organs and certain disorders involving the immune mechanism: Secondary | ICD-10-CM | POA: Insufficient documentation

## 2012-12-30 DIAGNOSIS — Z8679 Personal history of other diseases of the circulatory system: Secondary | ICD-10-CM | POA: Insufficient documentation

## 2012-12-30 DIAGNOSIS — R142 Eructation: Secondary | ICD-10-CM | POA: Insufficient documentation

## 2012-12-30 DIAGNOSIS — R2681 Unsteadiness on feet: Secondary | ICD-10-CM | POA: Insufficient documentation

## 2012-12-30 DIAGNOSIS — Z79899 Other long term (current) drug therapy: Secondary | ICD-10-CM | POA: Insufficient documentation

## 2012-12-30 DIAGNOSIS — F329 Major depressive disorder, single episode, unspecified: Secondary | ICD-10-CM | POA: Insufficient documentation

## 2012-12-30 DIAGNOSIS — R112 Nausea with vomiting, unspecified: Secondary | ICD-10-CM | POA: Insufficient documentation

## 2012-12-30 DIAGNOSIS — F411 Generalized anxiety disorder: Secondary | ICD-10-CM | POA: Insufficient documentation

## 2012-12-30 DIAGNOSIS — Z8711 Personal history of peptic ulcer disease: Secondary | ICD-10-CM | POA: Insufficient documentation

## 2012-12-30 DIAGNOSIS — Z9889 Other specified postprocedural states: Secondary | ICD-10-CM | POA: Insufficient documentation

## 2012-12-30 DIAGNOSIS — Z9049 Acquired absence of other specified parts of digestive tract: Secondary | ICD-10-CM | POA: Insufficient documentation

## 2012-12-30 LAB — CBC WITH DIFFERENTIAL/PLATELET
Eosinophils Relative: 1 % (ref 0–5)
HCT: 35.9 % — ABNORMAL LOW (ref 39.0–52.0)
Hemoglobin: 11.9 g/dL — ABNORMAL LOW (ref 13.0–17.0)
Lymphocytes Relative: 34 % (ref 12–46)
MCHC: 33.1 g/dL (ref 30.0–36.0)
MCV: 88.9 fL (ref 78.0–100.0)
Monocytes Absolute: 0.4 10*3/uL (ref 0.1–1.0)
Monocytes Relative: 6 % (ref 3–12)
Neutro Abs: 3.9 10*3/uL (ref 1.7–7.7)
WBC: 6.6 10*3/uL (ref 4.0–10.5)

## 2012-12-30 LAB — COMPREHENSIVE METABOLIC PANEL
BUN: 16 mg/dL (ref 6–23)
CO2: 25 mEq/L (ref 19–32)
Calcium: 8.7 mg/dL (ref 8.4–10.5)
Chloride: 105 mEq/L (ref 96–112)
Creatinine, Ser: 0.89 mg/dL (ref 0.50–1.35)
GFR calc Af Amer: >90 mL/min (ref >90)
GFR calc non Af Amer: >90 mL/min (ref >90)
Glucose, Bld: 97 mg/dL (ref 70–99)
Total Bilirubin: 0.1 mg/dL — ABNORMAL LOW (ref 0.3–1.2)

## 2012-12-30 MED ORDER — PROMETHAZINE HCL 25 MG/ML IJ SOLN
25.0000 mg | Freq: Once | INTRAMUSCULAR | Status: AC
  Administered 2012-12-30: 25 mg via INTRAVENOUS
  Filled 2012-12-30: qty 1

## 2012-12-30 MED ORDER — HYDROMORPHONE HCL PF 1 MG/ML IJ SOLN
0.5000 mg | Freq: Once | INTRAMUSCULAR | Status: AC
  Administered 2012-12-30: 0.5 mg via INTRAVENOUS
  Filled 2012-12-30: qty 1

## 2012-12-30 MED ORDER — IOHEXOL 300 MG/ML  SOLN
25.0000 mL | INTRAMUSCULAR | Status: AC
  Administered 2012-12-31: 25 mL via ORAL

## 2012-12-30 NOTE — ED Provider Notes (Signed)
47 year old male who had sigmoid colon resection for diverticulitis in July of last year comes in with abdominal distention, nausea, vomiting. He has had bowel movements and is passing flatus but his wife states he is passing less than normal. He had some crampy left sided and upper abdominal pain which is improved after vomiting or passing flatus. On exam, incisional hernias are present and her abdomen is mildly distended. The abdomen is soft with tenderness noted in the left lower quadrant. There is no rebound or guarding. Bowel sounds are decreased. Picture is that of a partial small bowel obstruction. X-rays will be obtained and if inconclusive, will get CT scan.  Medical screening examination/treatment/procedure(s) were conducted as a shared visit with non-physician practitioner(s) and myself.  I personally evaluated the patient during the encounter   Dione Booze, MD 12/30/12 [id number]

## 2012-12-30 NOTE — Telephone Encounter (Signed)
Patient's wife called to let me know that he is having persistent vomiting after minimal eating today. He is also passing minimal gas and is very bloated. In light of his numerous abdominal procedures and clinical symptoms, I told the patient and his wife that I was concerned about a bowel obstruction. I recommended going to the hospital for further evaluation. They both agreed and will go to [facility].

## 2012-12-30 NOTE — ED Notes (Signed)
PT. REPORTS NAUSEA / VOMITTING FOR 2 DAYS , DENIES DIARRHEA , BM X2 TODAY , PT. STATED HISTORY OF COLECTOMY , UNRELIEVED BY PRESCRIPTION REGLAN .

## 2012-12-30 NOTE — ED Provider Notes (Signed)
History     CSN: [id number]  Arrival date & time 12/30/12  2238   First MD Initiated Contact with Patient 12/30/12 2306      Chief Complaint  Patient presents with  . Emesis    (Consider location/radiation/quality/duration/timing/severity/associated sxs/prior treatment) HPI Comments: 47 year old male with a history of colon resection in July of 2013 with 3 repairs since, last being 07/23/2012 presents emergency department with his wife complaining of worsening abdominal pain, nausea and vomiting over the past 2 days. States his abdomen is appearing more distended, his bowel movements have been "off", passing minimal gas, however he did have 2 bowel movements today, however he has been constipated. He tried taking his prescription Reglan without any relief. Abdominal pain located mid-epigastric region rated 4/10. Abdominal surgery performed by Dr. Carolynne Edouard. He saw his primary care physician Dr. [name] yesterday who was concerned about his worsening abdominal pain and abdominal protuberance. Wife called PCP today who advised him to go to the ED. Nausea and vomiting worse at night, states he eats randomly throughout the day so it is difficult to tell whether or not vomiting is related to food. Denies fever or chills. No chest pain, sob, urinary changes.  Patient is a 47 y.o. male presenting with vomiting. The history is provided by the patient and the spouse.  Emesis Associated symptoms: abdominal pain   Associated symptoms: no chills     Past Medical History  Diagnosis Date  . Hypoglycemia   . GERD (gastroesophageal reflux disease)   . Diverticulosis     By colonoscopy June 2005  . Lower GI bleed     June 2005. Presumably secondary to diverticulosis.  . Anemia   . Anxiety   . Alcohol abuse   . Hemorrhoids   . Family history of anesthesia complication     Mother N/V  . Bronchitis     history  . Cellulitis     - left knee-2009  . Glaucoma syndrome     does not use eye drops,  "They burn".  . Sleep apnea     witness apnea by wife.  . Elevated CK 3/13  . Depression   . Incisional hernia   . Prinzmetal angina   . History of stomach ulcers 2005  . Headache(784.0)     "alot; not daily" (07/23/2012)  . Migraines     "not often" (07/23/2012)  . Condyloma 02/04/2012    Past Surgical History  Procedure Laterality Date  . Appendectomy  1982  . Elbow fracture surgery  ~ 1975    left;  pins inserted   . Wisdom tooth extraction  ~ 1983  . Laparoscopic left colon resection  02/19/2012    SIGMOID  . Wart fulguration  02/19/2012    Procedure: FULGURATION ANAL WART;  Surgeon: Robyne Askew, MD;  Location: [facility] OR;  Service: General;  Laterality: N/A;  Destroy  Anal Condyloma   . Laparotomy  02/27/2012    Procedure: EXPLORATORY LAPAROTOMY;  Surgeon: Robyne Askew, MD;  Location: [facility] OR;  Service: General;  Laterality: N/A;  . Colostomy  02/27/2012    Procedure: COLOSTOMY;  Surgeon: Robyne Askew, MD;  Location: [facility] OR;  Service: General;  Laterality: N/A;  . Colon resection  02/27/2012    Procedure: COLON RESECTION;  Surgeon: Robyne Askew, MD;  Location: [facility] OR;  Service: General;  Laterality: N/A;  . Colostomy takedown  07/23/2012  . Abdominal exploration surgery  07/23/2012    w/LOA (  07/23/2012)  . Colostomy takedown  07/23/2012    Procedure: COLOSTOMY TAKEDOWN;  Surgeon: Robyne Askew, MD;  Location: [facility] OR;  Service: General;  Laterality: N/A;  Primary Anastomosis  . Lesion destruction  07/23/2012    Procedure: DESTRUCTION LESION ANUS;  Surgeon: Robyne Askew, MD;  Location: [facility] OR;  Service: General;  Laterality: N/A;  DESTRUCTION ANAL CONDYLOMA  . Lysis of adhesion  07/23/2012    Procedure: LYSIS OF ADHESION;  Surgeon: Robyne Askew, MD;  Location: [facility] OR;  Service: General;  Laterality: N/A;  . Laparotomy  07/23/2012    Procedure: EXPLORATORY LAPAROTOMY;  Surgeon: Robyne Askew, MD;  Location: [facility] OR;  Service: General;  Laterality: N/A;    Family History  Problem Relation  Age of Onset  . Diabetes Mother   . Parkinsonism Mother   . Depression Mother   . Alcohol abuse Father   . Hypertension Father   . Breast cancer Maternal Aunt   . Stroke Neg Hx   . Heart disease Neg Hx   . Anxiety disorder Father   . Ulcers Father   . Colon polyps Father   . Diabetes Mother   . Diabetes Maternal Grandmother     History  Substance Use Topics  . Smoking status: Current Every Day Smoker -- 3.00 packs/day for 27 years    Types: Cigarettes  . Smokeless tobacco: Former User    Types: Chew     Comment: 07/23/2012 "raely uses dip"  . Alcohol Use: Not on file     Comment: h/o 24 PK/ WEEKEND; 07/23/2012 "sober since 02/12/2013"      Review of Systems  Constitutional: Negative for fever, chills and appetite change.  Respiratory: Negative for shortness of breath.   Cardiovascular: Negative for chest pain.  Gastrointestinal: Positive for nausea, vomiting, abdominal pain, constipation and abdominal distention.  Genitourinary: Negative for urgency, frequency and difficulty urinating.  All other systems reviewed and are negative.    Allergies  Ambien; Darvocet; Morphine and related; and Bactroban  Home Medications   Current Outpatient Rx  Name  Route  Sig  Dispense  Refill  . busPIRone (BUSPAR) 15 MG tablet   Oral   Take 15 mg by mouth 3 (three) times daily.         . diazepam (VALIUM) 5 MG tablet   Oral   Take 5 mg by mouth every 8 (eight) hours as needed for anxiety or sleep.         . divalproex (DEPAKOTE) 500 MG DR tablet   Oral   Take 1,000 mg by mouth daily with breakfast.          . doxycycline (VIBRA-TABS) 100 MG tablet   Oral   Take 1 tablet (100 mg total) by mouth 2 (two) times daily.   30 tablet   2   . HYDROcodone-acetaminophen (NORCO/VICODIN) 5-325 MG per tablet   Oral   Take 1 tablet by mouth every 12 (twelve) hours as needed for pain.   60 tablet   0   . metoCLOPramide (REGLAN) 10 MG tablet   Oral   Take 10 mg by mouth 3 (three)  times daily as needed (nausea).         Marland Kitchen OVER THE COUNTER MEDICATION   Oral   Take 2 tablets by mouth daily. Medication:Thymunose OTC         . polyethylene glycol (MIRALAX / GLYCOLAX) packet   Oral   Take 17 g  by mouth daily.           BP 135/83  Pulse 75  Temp(Src) 97.8 F (36.6 C) (Oral)  Resp 21  SpO2 98%  Physical Exam  Nursing note and vitals reviewed. Constitutional: He is oriented to person, place, and time. He appears well-developed and well-nourished. No distress.  HENT:  Head: Normocephalic and atraumatic.  Mouth/Throat: Oropharynx is clear and moist.  Eyes: Conjunctivae and EOM are normal. Pupils are equal, round, and reactive to light. No scleral icterus.  Neck: Normal range of motion. Neck supple.  Cardiovascular: Normal rate, regular rhythm, normal heart sounds and intact distal pulses.   Pulmonary/Chest: Effort normal and breath sounds normal. No respiratory distress.  Abdominal: Soft. Bowel sounds are normal. He exhibits distension.  Incisional hernias noted around peri-umbilical region. Well healed vertical linear scar. Generalized mild abdominal tenderness, worse mid-epigastric without rigidity, guarding or rebound.  Musculoskeletal: Normal range of motion. He exhibits no edema.  Neurological: He is alert and oriented to person, place, and time.  Skin: Skin is warm and dry. He is not diaphoretic.  Psychiatric: He has a normal mood and affect. His behavior is normal.    ED Course  Procedures (including critical care time)  Labs Reviewed  CBC WITH DIFFERENTIAL  COMPREHENSIVE METABOLIC PANEL   Dg Abd Acute W/chest  12/31/2012   *RADIOLOGY REPORT*  Clinical Data: Nausea and vomiting for 2 days.  History of colectomy.  ACUTE ABDOMEN SERIES (ABDOMEN 2 VIEW & CHEST 1 VIEW)  Comparison: 09/16/2012  Findings: The heart size and pulmonary vascularity are normal. The lungs appear clear and expanded without focal air space disease or consolidation. No  blunting of the costophrenic angles.  No pneumothorax.  Mediastinal contours appear intact.  Gas and stool throughout the colon.  No small or large bowel distension.  The stomach appears to be mildly distended with gas and fluid likely representing ingested material.  No free intra- abdominal air.  No abnormal air fluid levels.  No radiopaque stones.  Visualized bones appear intact.  Vascular calcifications.  IMPRESSION: No evidence of active pulmonary disease.  Suggestion of mild gastric distension.  Nonobstructive bowel gas pattern.   Original Report Authenticated By: Burman Nieves, M.D.     No diagnosis found.    MDM  47 y/o male with abdominal pain, n/v, distension and decreased BM. Advised to go to ER by PCP. Concerns of developing obstruction. Hx of multiple abdominal surgeries by Dr. Carolynne Edouard, last being 07/23/2012. Obtaining AAS, cbc, cmp. Patient appears in NAD with normal vital signs. Will give phenergan for nausea. 4/10 pain at this time. Case discussed with Dr. Preston Fleeting who also evaluated patient and agrees with plan of care. 12:25 AM AAS without any evidence of bowel obstruction. Obtaining CT abdomen/pelvis. 12:55 AM Patient discussed with Ivar Drape, PA-C at shift change who will take over care of patient at this time.    Trevor Mace, PA-C 12/31/12 [phone]

## 2012-12-31 ENCOUNTER — Emergency Department (HOSPITAL_COMMUNITY): Payer: Medicaid Other

## 2012-12-31 ENCOUNTER — Encounter (HOSPITAL_COMMUNITY): Payer: Self-pay | Admitting: Radiology

## 2012-12-31 MED ORDER — IOHEXOL 300 MG/ML  SOLN
100.0000 mL | Freq: Once | INTRAMUSCULAR | Status: AC | PRN
  Administered 2012-12-31: 100 mL via INTRAVENOUS

## 2012-12-31 MED ORDER — HYDROCODONE-ACETAMINOPHEN 5-325 MG PO TABS: 2.0000 | ORAL_TABLET | Freq: Four times a day (QID) | ORAL | Status: DC | PRN

## 2012-12-31 NOTE — ED Provider Notes (Signed)
Patient signed out to me by Trenda Moots.  Plan at signout is to follow-up on CT evaluation for SBO.  Results for orders placed during the hospital encounter of 12/30/12  CBC WITH DIFFERENTIAL      Result Value Range   WBC 6.6  4.0 - 10.5 K/uL   RBC 4.04 (*) 4.22 - 5.81 MIL/uL   Hemoglobin 11.9 (*) 13.0 - 17.0 g/dL   HCT 45.4 (*) 09.8 - 11.9 %   MCV 88.9  78.0 - 100.0 fL   MCH 29.5  26.0 - 34.0 pg   MCHC 33.1  30.0 - 36.0 g/dL   RDW 14.7 (*) 82.9 - 56.2 %   Platelets 245  150 - 400 K/uL   Neutrophils Relative % 59  43 - 77 %   Neutro Abs 3.9  1.7 - 7.7 K/uL   Lymphocytes Relative 34  12 - 46 %   Lymphs Abs 2.3  0.7 - 4.0 K/uL   Monocytes Relative 6  3 - 12 %   Monocytes Absolute 0.4  0.1 - 1.0 K/uL   Eosinophils Relative 1  0 - 5 %   Eosinophils Absolute 0.1  0.0 - 0.7 K/uL   Basophils Relative 0  0 - 1 %   Basophils Absolute 0.0  0.0 - 0.1 K/uL  COMPREHENSIVE METABOLIC PANEL      Result Value Range   Sodium 139  135 - 145 mEq/L   Potassium 3.2 (*) 3.5 - 5.1 mEq/L   Chloride 105  96 - 112 mEq/L   CO2 25  19 - 32 mEq/L   Glucose, Bld 97  70 - 99 mg/dL   BUN 16  6 - 23 mg/dL   Creatinine, Ser 1.30  0.50 - 1.35 mg/dL   Calcium 8.7  8.4 - 86.5 mg/dL   Total Protein 6.2  6.0 - 8.3 g/dL   Albumin 3.2 (*) 3.5 - 5.2 g/dL   AST 20  0 - 37 U/L   ALT 18  0 - 53 U/L   Alkaline Phosphatase 76  39 - 117 U/L   Total Bilirubin 0.1 (*) 0.3 - 1.2 mg/dL   GFR calc non Af Amer >90  >90 mL/min   GFR calc Af Amer >90  >90 mL/min   Ct Abdomen Pelvis W Contrast  12/31/2012   *RADIOLOGY REPORT*  Clinical Data: Abdominal distension, nausea and vomiting.  Previous sigmoid colon resection for diverticulitis.  Crampy left-sided upper abdominal pain.  Mild abdominal distension on examination.  CT ABDOMEN AND PELVIS WITH CONTRAST  Technique:  Multidetector CT imaging of the abdomen and pelvis was performed following the standard protocol during bolus administration of intravenous contrast.  Contrast:  OMNIPAQUE IOHEXOL 300 MG/ML  SOLN  Comparison: 11/23/2012 from [employer].  Findings: Fibrosis or atelectasis in the lung bases.  The liver, spleen, gallbladder, pancreas, adrenal glands, kidneys, abdominal aorta, and retroperitoneal lymph nodes are unremarkable except for vascular calcifications.  The stomach is somewhat distended although contrast material flows through the small bowel into the ileum suggesting no evidence of obstruction.  Findings could be due to gastroparesis or dysmotility.  Stricturing due to peptic ulcer disease can also have this appearance potentially. Stool filled colon without distension.  There is a large abdominal wall hernia along the midline with herniation of fat, transverse colon, and small bowel.  The hernia is broad based and there is no evidence of obstruction.  There is also focal herniation of fat present on the right.  Scarring in the anterior abdominal wall consistent with postoperative change.  The open skin wound seen previously is decreasing in prominence today but a skin defect is still present.  No abscess demonstrated.  No free air or free fluid in the abdomen.  Pelvis:  Prostate gland is not enlarged.  Bladder wall is not thickened.  No free or loculated pelvic fluid collections.  Stool filled rectosigmoid colon. Focal area infiltration in the pericolonic fat at the junction of the sigmoid and descending colon may represent postoperative change although some residual or recurrent inflammation is not excluded.  No discrete abscess is appreciated.  The the appendix is not identified.  No significant lymphadenopathy in the pelvis.  Normal alignment of the lumbar vertebrae with minimal degenerative changes.  IMPRESSION: Ventral abdominal wall hernias containing bowel, transverse colon, and fat but without evidence of obstruction or abscess.  Scarring in the anterior abdominal wall consistent with postoperative change with suggestion of skin defect  due to open wound.  This is improving since previous study.  There is focal infiltration of the pericolonic fat in the distal descending colon region.  This is probably postoperative but residual or recurrent inflammatory process is not excluded.  No abscess.  Diffusely stool filled colon.   Original Report Authenticated By: Burman Nieves, M.D.   Dg Abd Acute W/chest  12/31/2012   *RADIOLOGY REPORT*  Clinical Data: Nausea and vomiting for 2 days.  History of colectomy.  ACUTE ABDOMEN SERIES (ABDOMEN 2 VIEW & CHEST 1 VIEW)  Comparison: 09/16/2012  Findings: The heart size and pulmonary vascularity are normal. The lungs appear clear and expanded without focal air space disease or consolidation. No blunting of the costophrenic angles.  No pneumothorax.  Mediastinal contours appear intact.  Gas and stool throughout the colon.  No small or large bowel distension.  The stomach appears to be mildly distended with gas and fluid likely representing ingested material.  No free intra- abdominal air.  No abnormal air fluid levels.  No radiopaque stones.  Visualized bones appear intact.  Vascular calcifications.  IMPRESSION: No evidence of active pulmonary disease.  Suggestion of mild gastric distension.  Nonobstructive bowel gas pattern.   Original Report Authenticated By: Burman Nieves, M.D.     3:59 AM No evidence of bowel obstruction. Discussed patient with Dr. Preston Fleeting. Will discharge with GI followup. Will give pain medicine for pain control. Patient understands and agrees with the plan. He is stable and ready for discharge.  Roxy Horseman, PA-C 12/31/12 [phone]

## 2012-12-31 NOTE — ED Notes (Signed)
Pt and wife comfortable with d/c and f/u instructions. Prescriptions x1

## 2012-12-31 NOTE — ED Provider Notes (Signed)
Medical screening examination/treatment/procedure(s) were performed by non-physician practitioner and as supervising physician I was immediately available for consultation/collaboration.   Dione Booze, MD 12/31/12 [phone]

## 2012-12-31 NOTE — ED Notes (Signed)
CT notified pt finished contrast  

## 2012-12-31 NOTE — ED Notes (Signed)
Pt returned from CT °

## 2013-01-01 ENCOUNTER — Ambulatory Visit (INDEPENDENT_AMBULATORY_CARE_PROVIDER_SITE_OTHER): Payer: Medicaid Other | Admitting: Family Medicine

## 2013-01-01 ENCOUNTER — Encounter: Payer: Self-pay | Admitting: Family Medicine

## 2013-01-01 VITALS — BP 121/75 | HR 60 | Temp 97.4°F | Ht 70.0 in | Wt 176.0 lb

## 2013-01-01 DIAGNOSIS — E876 Hypokalemia: Secondary | ICD-10-CM

## 2013-01-01 LAB — BASIC METABOLIC PANEL
BUN: 15 mg/dL (ref 6–23)
Potassium: 4.4 mEq/L (ref 3.5–5.3)
Sodium: 140 mEq/L (ref 135–145)

## 2013-01-01 LAB — BENZODIAZEPINES (GC/LC/MS), URINE
Alprazolam metabolite (GC/LC/MS), ur confirm: 81 ng/mL — AB
Diazepam (GC/LC/MS), ur confirm: NEGATIVE ng/mL
Estazolam (GC/LC/MS), ur confirm: NEGATIVE ng/mL
Flurazepam metabolite (GC/LC/MS), ur confirm: NEGATIVE ng/mL
Midazolam (GC/LC/MS), ur confirm: NEGATIVE ng/mL
Oxazepam (GC/LC/MS), ur confirm: 527 ng/mL — AB
Triazolam metabolite (GC/LC/MS), ur confirm: NEGATIVE ng/mL

## 2013-01-01 LAB — PRESCRIPTION MONITORING PROFILE (15 PANEL)
Amphetamine/Meth: NEGATIVE ng/mL
Barbiturate Screen, Urine: NEGATIVE ng/mL
Buprenorphine, Urine: NEGATIVE ng/mL
Carisoprodol, Urine: NEGATIVE ng/mL
Meperidine, Ur: NEGATIVE ng/mL
Methadone Screen, Urine: NEGATIVE ng/mL
Nitrites, Initial: NEGATIVE ug/mL
Propoxyphene: NEGATIVE ng/mL
Zolpidem, Urine: NEGATIVE ng/mL
pH, Initial: 6.5 pH (ref 4.5–8.9)

## 2013-01-01 LAB — OPIATES/OPIOIDS (LC/MS-MS)
Codeine Urine: NEGATIVE ng/mL
Heroin (6-AM), UR: NEGATIVE ng/mL
Hydrocodone: 213 ng/mL — AB
Hydromorphone: NEGATIVE ng/mL
Morphine Urine: NEGATIVE ng/mL
Norhydrocodone, Ur: 124 ng/mL — AB

## 2013-01-01 NOTE — Progress Notes (Signed)
  Subjective:    Patient ID: Timothy [name], male    DOB: [birth year], 47 y.o.   MRN: [id number]  HPI  47 year old M with bipolar disorder, tobacco abuse, and abdominal pain s/p bowel resection for recurrent diverticulitis in July 2013 and reanastomosis 07/23/12. He presented to my office 45 days ago with complaints of gait instability and falls. For this I decreased his Depakote (taking for Bipolar disease) to 500 mg daily from 1000. I also checked his Depakote level, which came back within normal limits. He also complained of abdominal pain and persistent vomiting. He went to the ED on Wednesday night due to persistent vomiting, for which I was concerned about a SBO given numerous abdominal procedures. A CT scan was performed which ruled out an obstruction, but did show multiple ventral wall abdominal hernias, focal inflammation of peri-colonic fat in the descending colon without abscess, and a large stool burden.   Today, the patient presents for follow up and notes that his nausea is mildly improved and has not had any vomiting since Wednesday. He is taking Reglan 10 mg PRN. His pain is persistent in the LLQ and superior to his scar bilaterally. He is using norco 5-325 1 tp 1.5 pills daily as needed. He is passing stool frequently and using miralax daily. He denies melena or hematochezia. Overall, he is very frustrated with the process and quite sad that he is so limited in his functional ability right now.    Review of Systems     Objective:   Physical Exam  BP 121/75  Pulse 60  Temp(Src) 97.4 F (36.3 C) (Oral)  Ht 5\' 10"  (1.778 m)  Wt 176 lb (79.833 kg)  BMI 25.25 kg/m2  Gen: middle age WM, flat affect, mild distress Abd: abdomdinal protuberance visible with shirt on,bilateral abdominal hernias, 0.5 cm defect in incision that is otherwise well healed, point tenderness in LLQ, hypoactive bowel sounds   Assessment & Plan:  The patient is having a series of unfortunate post-operative  issues. The development of hernias are new since his last appointment with Dr. Carolynne Edouard. I do not think that the hernias are the cause of all of this pain and nausea, but I think these need to be evaluated by the general surgery team again to make sure this will not become another significant problem. I encouraged him to continue using Miralax, because his stool burden is likely causing discomfort. Encouraged use of abdominal binder to help with protuberance of abdomen. Reglan PRN for nausea and Norco PRN for pain. Follow up in 2-3 weeks.

## 2013-01-01 NOTE — Patient Instructions (Addendum)
Take care of yourself buddy. I will send a note to Dr. Carolynne Edouard. Please wear the abdominal binder and follow up with me in 2-3 weeks.   Take Care,   Dr. [name]

## 2013-01-02 ENCOUNTER — Telehealth: Payer: Self-pay | Admitting: Family Medicine

## 2013-01-02 NOTE — Telephone Encounter (Signed)
Received after hours call from patient that he is still having nausea despite reglan.  Would like phenergan called in.  I explained to him that our policy is not to call in any medications over the phone.  Options today if he had severe nausea or vomiting would be evaluation in the ED or urgent care.  Suggested that if he was able he could wait to be seen in our clinic on Monday.

## 2013-01-03 ENCOUNTER — Encounter: Payer: Self-pay | Admitting: Family Medicine

## 2013-01-04 ENCOUNTER — Ambulatory Visit (INDEPENDENT_AMBULATORY_CARE_PROVIDER_SITE_OTHER): Payer: Medicaid Other | Admitting: Psychology

## 2013-01-04 ENCOUNTER — Ambulatory Visit (INDEPENDENT_AMBULATORY_CARE_PROVIDER_SITE_OTHER): Payer: Medicaid Other | Admitting: Family Medicine

## 2013-01-04 ENCOUNTER — Encounter: Payer: Self-pay | Admitting: Family Medicine

## 2013-01-04 VITALS — BP 156/98 | HR 72 | Ht 70.0 in | Wt 174.0 lb

## 2013-01-04 DIAGNOSIS — F319 Bipolar disorder, unspecified: Secondary | ICD-10-CM

## 2013-01-04 DIAGNOSIS — R112 Nausea with vomiting, unspecified: Secondary | ICD-10-CM

## 2013-01-04 MED ORDER — PROMETHAZINE HCL 25 MG PO TABS
25.0000 mg | ORAL_TABLET | Freq: Three times a day (TID) | ORAL | Status: DC | PRN
Start: 2013-01-04 — End: 2013-01-20

## 2013-01-04 NOTE — Assessment & Plan Note (Signed)
Nausea and vomiting, looks to be a chronic issue.  Recent CT without obstruction or acute process.  Reglan not working well for him.  Phenergan has been helpful in the past, will prescribe this for now.  Has follow up with his surgeon 6/23 and Pcp on 7/2.

## 2013-01-04 NOTE — Patient Instructions (Addendum)
Thank you for coming in today, it was good to see you Keep your upcoming appointments with Dr. Carolynne Edouard and Dr. [name] I have sent in phenergan for your nausea, avoid driving while taking this as it can make you drowsy Follow up sooner if this is not improving.

## 2013-01-04 NOTE — Progress Notes (Signed)
  Subjective:    Patient ID: Timothy [name], male    DOB: 09-Jul-1966, 47 y.o.   MRN: [id number]  HPI  1. Nausea:  Patient here with c/o nausea with vomiting.  Has a history abdominal surgery for diverticulitis with wound infection.  Has had chronic nausea off and on.  Recently seen and started on reglan but this has not been helpful for his nausea.  He also had a recent CT scan that did not show anything acute.  His reports having a large BM today.  He denies bilious vomiting, hematemesis, melena.    Review of Systems Per HPI    Objective:   Physical Exam  Constitutional: He appears well-developed and well-nourished. No distress.  HENT:  Head: Normocephalic and atraumatic.  Cardiovascular: Normal rate and regular rhythm.   Abdominal: Soft.  Large midline abdominal scar, with small opening in center from non-healing that appears to be chronic.  Abdomen is non-tender.  BS+           Assessment & Plan:

## 2013-01-04 NOTE — Progress Notes (Signed)
[name] presents for follow-up.  He decreased his Depakote further (to 500 mg) upon the recommendation of his PCP because he was dizzy and falling.  He reiterated that he does not like how this medicine makes him feel.  He is willing to hold out until his follow-up [facility] appointment next week.  [name] has been trying to be more patients / less reactive with family.  He initiated a conversation with his mother-in-law that went well.  He remained calm but was ready for a fight if it had gone the other way.    He stated he has four hernias and has to wear a band around his stomach.  He has an appointment with the surgeon and wonders whether he will need another surgery.  The bulk of our session was spent on the loss of identity he has a provider - both for himself and secondarily - his family.  He reports he has always been self-sufficient and able to get what he needs by working very hard.  At 47 years old, applying for disability and already on food stamps, he is at a loss.  It feels terrible to him.  In addition to this loss of identify, he has lost social connections through work and structure - something very important to him.

## 2013-01-04 NOTE — Assessment & Plan Note (Signed)
Report of mood is irritable.  He got tearful several times today which is difficult for him to manage.  I suspect that crying was frowned upon if not punished while he was growing up.  His sadness and fear (worry) are substantial and he has not good means of expressing it other than angry outbursts.  There was definitely some tenderness and vulnerability demonstrated today though which I saw as progress and which hopefully won't scare him off.    We discussed the possible paths that he might go - his health could improve and he could regain some function or if not, acceptance of his limitations will be an important part of creating a life that makes sense.  He noted his young age several times today.  He has applied for disability and has mixed emotions about this.    Discussed choice points on a daily basis and the idea that he can tend toward anger and lashing out or he can lean toward patience and kindness.  His illness gives him an extra push toward anger but it remains a choice.    He reported he thinks a great deal after our meetings so perhaps more is getting in than what I thought originally.  Will call to schedule him in July when I have that schedule.

## 2013-01-08 ENCOUNTER — Telehealth (INDEPENDENT_AMBULATORY_CARE_PROVIDER_SITE_OTHER): Payer: Self-pay | Admitting: Surgery

## 2013-01-08 NOTE — Telephone Encounter (Signed)
Gentleman with open wound packing.  Notice bleeding on the wound.  It concerned him.  He is eating okay.  Moving bowels well.  Since he has called the bleeding has slowed down.  He has put pressure on the area.  He did not think he had to go the emergency room for given his bump the surgical course, wanted to be safe and call.  Given the fact that he is improving on its own, I am hopeful that pressure and time should help improve.  He feels reassured.  He is due to see his surgeon in a few days.  He will call if there are other issues

## 2013-01-11 ENCOUNTER — Encounter (INDEPENDENT_AMBULATORY_CARE_PROVIDER_SITE_OTHER): Payer: Self-pay | Admitting: General Surgery

## 2013-01-11 ENCOUNTER — Ambulatory Visit (INDEPENDENT_AMBULATORY_CARE_PROVIDER_SITE_OTHER): Payer: Medicaid Other | Admitting: General Surgery

## 2013-01-11 VITALS — BP 128/80 | HR 80 | Resp 16 | Ht 70.0 in | Wt 173.8 lb

## 2013-01-11 DIAGNOSIS — K439 Ventral hernia without obstruction or gangrene: Secondary | ICD-10-CM

## 2013-01-11 HISTORY — DX: Ventral hernia without obstruction or gangrene: K43.9

## 2013-01-11 NOTE — Patient Instructions (Signed)
Continue to shower and pack wound daily Use abdominal binder for support

## 2013-01-11 NOTE — Progress Notes (Signed)
Subjective:     Patient ID: Timothy [name], male   DOB: July 06, 1966, 47 y.o.   MRN: [id number]  HPI The patient is a 47 year old white male who is about 6 months status post colostomy reversal. His postoperative course was complicated by a wound infection and some fascial dehiscence. He has had a couple episodes recently of some nausea and vomiting. He continues to complain of abdominal pain. His appetite has been good and his bowels are working normally. He had a CT scan done recently that showed no evidence of obstruction and the fascial dehiscence consistent with a hernia  Review of Systems  Constitutional: Negative.   HENT: Negative.   Eyes: Negative.   Respiratory: Negative.   Cardiovascular: Negative.   Gastrointestinal: Positive for nausea, abdominal pain and abdominal distention.  Endocrine: Negative.   Genitourinary: Negative.   Musculoskeletal: Negative.   Skin: Negative.   Allergic/Immunologic: Negative.   Neurological: Negative.   Hematological: Negative.   Psychiatric/Behavioral: Negative.        Objective:   Physical Exam  Constitutional: He is oriented to person, place, and time. He appears well-developed and well-nourished.  HENT:  Head: Normocephalic and atraumatic.  Eyes: Conjunctivae and EOM are normal. Pupils are equal, round, and reactive to light.  Neck: Normal range of motion. Neck supple.  Cardiovascular: Normal rate, regular rhythm and normal heart sounds.   Pulmonary/Chest: Effort normal and breath sounds normal.  Abdominal: Soft. Bowel sounds are normal.  There is some bulging of the abdominal wall especially with straining just to the right of the umbilicus. This is easily reducible. There is a small opening at the upper portion of his old incision that is clean and packed with gauze. There is no drainage.  Musculoskeletal: Normal range of motion.  Neurological: He is alert and oriented to person, place, and time.  Skin: Skin is warm and dry.   Psychiatric: He has a normal mood and affect. His behavior is normal.       Assessment:     The patient is 6 months status post colostomy reversal. He now has evidence of a ventral hernia.     Plan:     At this point I would like to try to wait closer to leave from his last surgery before trying to get back into his belly. Since he has no evidence of obstruction see him back in about 3 months to begin surgical planning

## 2013-01-13 ENCOUNTER — Ambulatory Visit (INDEPENDENT_AMBULATORY_CARE_PROVIDER_SITE_OTHER): Payer: Medicaid Other | Admitting: Psychology

## 2013-01-13 DIAGNOSIS — F319 Bipolar disorder, unspecified: Secondary | ICD-10-CM

## 2013-01-13 NOTE — Assessment & Plan Note (Signed)
Report of mood remains irritable.  He has a great deal of sadness but is less comfortable talking about this part of his emotional experience.  He was tearful again today when talking about not being able to work and feeling worthless.  He is interested in trying another medicine to help his mood.  Discussed options and he and Dr. Kathrynn Running decided on Abilify - starting low and going slow.  See patient instructions for further plan.

## 2013-01-13 NOTE — Progress Notes (Signed)
Timothy [name] presents for follow-up.  Depakote was not a good match for him.  He is currently taking Buspar every morning and occasionally a second or third dose throughout the day, Valium in the morning and maybe a 1/2 tablet later in the day.  He is also taking hydrocodone two times a day.    He is trying to not sit as much and finds himself the least irritable when puttering in his shed or weeding his flowerbed by himself.  Irritability with others remains a significant issue.  He also is struggling with some bad news regarding needing another stomach surgery in about three months and finding out that his step-daughter has a tumor and will need to be seen at [facility].  This is the second time she has had this kind of thing.

## 2013-01-13 NOTE — Patient Instructions (Addendum)
Please schedule a follow up for: August 6th at 9:00.  I will see you on the 1st at 9:00. Dr. Kathrynn Running recommended you stop the Depakote.  He thinks Abilify might be a good match for you.  The most common side effect is headache and the need to move Jinny Blossom).  He prescribed 5 mg tablets but for the first two weeks, cut the pill in half and take 1/2 a pill.  After two weeks, you can increase to 1 pill a day.   The symptom we hope to improve the most is your irritability.  If you can write down in a calendar a number between 0 and 3 with 0 being no irritability and 3 being VERY irritable, this will help give [place] a sense of whether the medicine is working.

## 2013-01-15 ENCOUNTER — Other Ambulatory Visit (INDEPENDENT_AMBULATORY_CARE_PROVIDER_SITE_OTHER): Payer: Self-pay | Admitting: General Surgery

## 2013-01-15 ENCOUNTER — Telehealth: Payer: Self-pay | Admitting: Family Medicine

## 2013-01-15 ENCOUNTER — Telehealth: Payer: Self-pay | Admitting: Emergency Medicine

## 2013-01-15 ENCOUNTER — Telehealth (INDEPENDENT_AMBULATORY_CARE_PROVIDER_SITE_OTHER): Payer: Self-pay | Admitting: *Deleted

## 2013-01-15 DIAGNOSIS — R112 Nausea with vomiting, unspecified: Secondary | ICD-10-CM

## 2013-01-15 MED ORDER — METOCLOPRAMIDE HCL 5 MG PO TABS: 5.0000 mg | ORAL_TABLET | Freq: Four times a day (QID) | ORAL | Status: DC

## 2013-01-15 MED ORDER — ONDANSETRON 4 MG PO TBDP: 4.0000 mg | ORAL_TABLET | Freq: Three times a day (TID) | ORAL | Status: DC | PRN

## 2013-01-15 NOTE — Telephone Encounter (Signed)
Patient is calling because he has not felt good 3 days now. The medication promethazine 25 mg is not working he has gotten up three nights in a row to throw up always between 1: 00 a.m. and 1:30 a.m.. He is requesting something stronger until he is able to come back on the 7/2 to see Dr. [name]. He is also taking his pain medication and he wants to get this under control because he takes this all the time. JW

## 2013-01-15 NOTE — Telephone Encounter (Signed)
i have sent some reglan to his pharmacy for the nausea. i am not going to give him stronger pain meds right now

## 2013-01-15 NOTE — Telephone Encounter (Signed)
Emergency Line Call Patient calling regarding nausea, vomiting and abdominal pain that has presisted after abdominal surgery.  He states that he has an appointment with Dr. [name] on 7/2, but can't wait that long.  He is having recurrent vomiting around 1am.  This is a little better with phenergan, 1 to 1.5 norco and sleeping sitting up.  Discussed that if pain is the bigger problem, he needs to be seen by a physician before additional pain medicine will be prescribed.  He states that the nausea and vomiting is more of a problem.  Will send zofran odt to his pharmacy.  Discussed that if this does not help, he will need to go to the ED and be evaluated by a physician.  He expressed understanding and agreement with this plan.

## 2013-01-15 NOTE — Telephone Encounter (Signed)
LMOM to call back. Please relay Dr Caralyn Guile message below.

## 2013-01-15 NOTE — Telephone Encounter (Signed)
Patient called in today stating that he has been waking up vomiting the last 3 nights.  Patient states he has been taking Phenergan, Diazepam, and 2 Norco at a time however he continues to have pain management issues and 1 issue of vomiting each night.  Patient states he does feel better after he is able to vomit and can sleep.  Patient states he spoke to his PMD who referred him back to [place] to obtain a stronger pain medication and discuss his symptoms.  This RN see's the note from 12/16/12 stating that we referred him back to his PMD for pain management issues.  Explained that this RN will send a message to Dr. Carolynne Edouard to discuss the plan.  Patient agreeable at this time.

## 2013-01-15 NOTE — Telephone Encounter (Signed)
I spoke with the patient regarding his nausea and vomiting and encouraged taking Zofran and staying hydrated. We will readdress at his visit on 01/20/13.

## 2013-01-15 NOTE — Telephone Encounter (Signed)
Pt reports waking up at 130 every morning vomiting (past 4 nights) and once he vomits everything is fine, takes anxiety & pain pill then goes back to sleep. " I felt terrible yesterday and I don't want to feel like this for the next 3 months.I am having to take more and more of my pain pills" Suggested that pt eat at dinner at least 4 hours before laying down to sleep for the night, call surgeon's office and inform them of ongoing problem & for advice, continue to wear binder 24/7 as directed - will forward message to Dr. [name] for further direction. Wyatt Haste, RN-BSN

## 2013-01-18 ENCOUNTER — Telehealth: Payer: Self-pay | Admitting: Family Medicine

## 2013-01-18 NOTE — Telephone Encounter (Signed)
I cannot refill pain medications without a visit. This is a the clinic policy.

## 2013-01-18 NOTE — Telephone Encounter (Signed)
Will fwd to MD.  Timothy [name] L, CMA  

## 2013-01-18 NOTE — Telephone Encounter (Signed)
Wants to know if dr [name] will refill hydrocodone before his visit on Wed Please advise

## 2013-01-18 NOTE — Telephone Encounter (Signed)
Pt notified.  [name], CMA  

## 2013-01-19 ENCOUNTER — Ambulatory Visit (INDEPENDENT_AMBULATORY_CARE_PROVIDER_SITE_OTHER): Payer: Medicaid Other | Admitting: Psychology

## 2013-01-19 DIAGNOSIS — F319 Bipolar disorder, unspecified: Secondary | ICD-10-CM

## 2013-01-19 NOTE — Assessment & Plan Note (Addendum)
Mood remains irritable and sad.  Pain is significant.  He settles down nicely in the appointment and seems to benefit from an opportunity to open up.  He is tearful around the surgery.  I think getting an opportunity to admit this fear is important.  Discussed if and how he will talk to his sons about his concerns.  Talked about choices he has regarding his situation and specifically looked at the things he can't control and the main thing he can which is his reaction to the pain and other stressors.  Encouraged him to do one thing each day that focuses his attention on something positive.  Used his fishing story from Sunday as an example (keeping in mind that the activity might be much shorter).  Tolerating Abilify fine.  No safety issues identified.  Low dose and early to determine efficacy.  Back to smoking three packs per day.  Will follow on July 17th at 9:00 or he can call sooner if needed.

## 2013-01-19 NOTE — Progress Notes (Signed)
Timothy [name] presents for follow-up.  He has been out of pain medicine for a few days and is struggling to manage it.  He says the pain is not as significant as the repeated vomiting he has had most nights.  Zofran has helped with this but he continues to struggle.  He has been trying to follow the recommendation that he eat at least four hours before going to bed but he admits to awakening hungry in the middle of the night and getting ice cream.    In addition to the chronic stress we have been discussing, things continue to be tough with his relationship with his wife especially since her daughter is being worked up at [employer] for a tumor.  He also is very nervous about his future stomach surgery.  He has been thinking about the possibility that he might not survive it.  Leaving his children behind is the thing that bothers him the most.    Taking Abilify every morning with no reported difficulty.  Kept a notebook documenting his irritability - he is usually without irritability when alone with a marked increase when around his wife or other family members at the house.  Went fishing on Sunday with his sons and his best friend and had a "perfect day."

## 2013-01-20 ENCOUNTER — Telehealth: Payer: Self-pay | Admitting: Family Medicine

## 2013-01-20 ENCOUNTER — Ambulatory Visit (INDEPENDENT_AMBULATORY_CARE_PROVIDER_SITE_OTHER): Payer: Medicaid Other | Admitting: Family Medicine

## 2013-01-20 ENCOUNTER — Emergency Department (HOSPITAL_COMMUNITY)
Admission: EM | Admit: 2013-01-20 | Discharge: 2013-01-20 | Disposition: A | Discharge status: Home / Self Care | Payer: Medicaid Other | Attending: Emergency Medicine | Admitting: Emergency Medicine

## 2013-01-20 ENCOUNTER — Encounter: Payer: Self-pay | Admitting: Family Medicine

## 2013-01-20 ENCOUNTER — Encounter (HOSPITAL_COMMUNITY): Payer: Self-pay | Admitting: Emergency Medicine

## 2013-01-20 VITALS — BP 164/103 | HR 121 | Temp 98.4°F | Ht 70.0 in | Wt 175.0 lb

## 2013-01-20 DIAGNOSIS — R1013 Epigastric pain: Secondary | ICD-10-CM

## 2013-01-20 DIAGNOSIS — K469 Unspecified abdominal hernia without obstruction or gangrene: Secondary | ICD-10-CM | POA: Insufficient documentation

## 2013-01-20 DIAGNOSIS — R112 Nausea with vomiting, unspecified: Secondary | ICD-10-CM

## 2013-01-20 DIAGNOSIS — Z8709 Personal history of other diseases of the respiratory system: Secondary | ICD-10-CM | POA: Insufficient documentation

## 2013-01-20 DIAGNOSIS — F329 Major depressive disorder, single episode, unspecified: Secondary | ICD-10-CM | POA: Insufficient documentation

## 2013-01-20 DIAGNOSIS — F411 Generalized anxiety disorder: Secondary | ICD-10-CM | POA: Insufficient documentation

## 2013-01-20 DIAGNOSIS — Z8679 Personal history of other diseases of the circulatory system: Secondary | ICD-10-CM | POA: Insufficient documentation

## 2013-01-20 DIAGNOSIS — Z8719 Personal history of other diseases of the digestive system: Secondary | ICD-10-CM | POA: Insufficient documentation

## 2013-01-20 DIAGNOSIS — R109 Unspecified abdominal pain: Secondary | ICD-10-CM

## 2013-01-20 DIAGNOSIS — F17200 Nicotine dependence, unspecified, uncomplicated: Secondary | ICD-10-CM | POA: Insufficient documentation

## 2013-01-20 DIAGNOSIS — G8929 Other chronic pain: Secondary | ICD-10-CM

## 2013-01-20 DIAGNOSIS — Z79899 Other long term (current) drug therapy: Secondary | ICD-10-CM | POA: Insufficient documentation

## 2013-01-20 DIAGNOSIS — Z862 Personal history of diseases of the blood and blood-forming organs and certain disorders involving the immune mechanism: Secondary | ICD-10-CM | POA: Insufficient documentation

## 2013-01-20 DIAGNOSIS — F3289 Other specified depressive episodes: Secondary | ICD-10-CM | POA: Insufficient documentation

## 2013-01-20 DIAGNOSIS — Z872 Personal history of diseases of the skin and subcutaneous tissue: Secondary | ICD-10-CM | POA: Insufficient documentation

## 2013-01-20 DIAGNOSIS — E876 Hypokalemia: Secondary | ICD-10-CM

## 2013-01-20 DIAGNOSIS — Z8639 Personal history of other endocrine, nutritional and metabolic disease: Secondary | ICD-10-CM | POA: Insufficient documentation

## 2013-01-20 DIAGNOSIS — Z8669 Personal history of other diseases of the nervous system and sense organs: Secondary | ICD-10-CM | POA: Insufficient documentation

## 2013-01-20 DIAGNOSIS — K219 Gastro-esophageal reflux disease without esophagitis: Secondary | ICD-10-CM | POA: Insufficient documentation

## 2013-01-20 LAB — COMPREHENSIVE METABOLIC PANEL
Alkaline Phosphatase: 84 U/L (ref 39–117)
BUN: 14 mg/dL (ref 6–23)
Chloride: 104 mEq/L (ref 96–112)
GFR calc Af Amer: >90 mL/min (ref >90)
GFR calc non Af Amer: >90 mL/min (ref >90)
Glucose, Bld: 139 mg/dL — ABNORMAL HIGH (ref 70–99)
Potassium: 3.1 mEq/L — ABNORMAL LOW (ref 3.5–5.1)
Total Bilirubin: 0.2 mg/dL — ABNORMAL LOW (ref 0.3–1.2)

## 2013-01-20 LAB — URINALYSIS, ROUTINE W REFLEX MICROSCOPIC
Bilirubin Urine: NEGATIVE
Glucose, UA: NEGATIVE mg/dL
Nitrite: NEGATIVE
Specific Gravity, Urine: 1.021 (ref 1.005–1.030)
pH: 7.5 (ref 5.0–8.0)

## 2013-01-20 LAB — CBC WITH DIFFERENTIAL/PLATELET
Eosinophils Absolute: 0.1 10*3/uL (ref 0.0–0.7)
Hemoglobin: 12.5 g/dL — ABNORMAL LOW (ref 13.0–17.0)
Lymphs Abs: 1.6 10*3/uL (ref 0.7–4.0)
MCH: 31.3 pg (ref 26.0–34.0)
Monocytes Relative: 10 % (ref 3–12)
Neutro Abs: 5.6 10*3/uL (ref 1.7–7.7)
Neutrophils Relative %: 70 % (ref 43–77)
RBC: 3.99 MIL/uL — ABNORMAL LOW (ref 4.22–5.81)

## 2013-01-20 LAB — LIPASE, BLOOD: Lipase: 26 U/L (ref 11–59)

## 2013-01-20 MED ORDER — ONDANSETRON 4 MG PO TBDP
4.0000 mg | ORAL_TABLET | Freq: Once | ORAL | Status: AC
  Administered 2013-01-20: 4 mg via ORAL
  Filled 2013-01-20: qty 1

## 2013-01-20 MED ORDER — HYDROCODONE-ACETAMINOPHEN 10-325 MG PO TABS: 1.0000 | ORAL_TABLET | Freq: Three times a day (TID) | ORAL | Status: DC | PRN

## 2013-01-20 MED ORDER — ONDANSETRON 4 MG PO TBDP: 4.0000 mg | ORAL_TABLET | Freq: Three times a day (TID) | ORAL | Status: DC | PRN

## 2013-01-20 MED ORDER — POTASSIUM CHLORIDE ER 10 MEQ PO TBCR: 20.0000 meq | EXTENDED_RELEASE_TABLET | Freq: Two times a day (BID) | ORAL | Status: DC

## 2013-01-20 MED ORDER — HYDROMORPHONE HCL PF 1 MG/ML IJ SOLN
0.5000 mg | Freq: Once | INTRAMUSCULAR | Status: AC
  Administered 2013-01-20: 0.5 mg via INTRAMUSCULAR
  Filled 2013-01-20: qty 1

## 2013-01-20 NOTE — Progress Notes (Signed)
  Subjective:    Patient ID: Timothy [name], male    DOB: 1965/11/19, 47 y.o.   MRN: [id number]  HPI  47 year old M with bipolar disorder, tobacco abuse, and abdominal pain s/p bowel resection for recurrent diverticulitis in July 2013 and reanastomosis 07/23/12. He has had a complicated course of persistent nausea, vomiting and adominal pain. He now has multiple abdominal wall hernias. He has been evaluated numerous times recently for this pain and emesis. A CT scan on on 12/30/12 was negative for ileus or obstruction, but did demonstrate the hernias. Dr. Carolynne Edouard, his general surgeon, is planning a surgery to repair the hernias in approximately 3 months.   The patient went to the hospital at [facility] due to severe pain. He was given dilaudid 0.5 mg once, which he said markedly improved his pain. Currently it is a 4/10. He still has nightly vomiting, but feels that the zofran is much more effective than reglan or phenergan. He was also found to have a potassium level of 3.1.   Social - Step daughter recently discovered to have at tumor on her back. Is undergoing work-up at [facility]. This is a large source of additional stress.    Review of Systems  Constitutional: Negative for fever.  Gastrointestinal: Positive for vomiting, abdominal pain and abdominal distention. Negative for nausea, diarrhea, blood in stool and anal bleeding.       Negative for hematemesis  Psychiatric/Behavioral: Positive for sleep disturbance. Negative for dysphoric mood. The patient is nervous/anxious.        Objective:   Physical Exam BP 164/103  Pulse 121  Temp(Src) 98.4 F (36.9 C) (Oral)  Ht 5\' 10"  (1.778 m)  Wt 175 lb (79.379 kg)  BMI 25.11 kg/m2  Wt Readings from Last 5 Encounters:  01/20/13 175 lb (79.379 kg)  01/11/13 173 lb 12.8 oz (78.835 kg)  01/04/13 174 lb (78.926 kg)  01/01/13 176 lb (79.833 kg)  12/30/12 177 lb (80.287 kg)    Gen: middle aged WM, chronically ill appearing, non  distressed but very fatigued appearing Abdomen: protuberant with numerous bulges through abdominal wall, hyperactive bowel sounds Psych: flat affect, no flight of ideas of pressured speech, normal thought content and speech       Assessment & Plan:  47 year old M with abdominal pain, nausea and vomiting, and hypokalemia.   Abdominal Pain - This is likely due to scarring. I will increase pain medication so that the patient does not have to go to the ED for pain control. Increase to Norco 10-325 mg PO q 8 PRN, # 90 with no refills.   Nausea and vomiting - Only occurs at night, so likely due physical obstruction dependent upon position of bowel dysmotility. That patient is able to maintain his weight and has no dysphagia, odynophagia, or hematemesis, so he does not need a GI referral at this point, but I will consider it. Continue Zofran PRN.   Hypokalemia - 3.1 overnight. Will replete with 20 meq daily x 7 days. Recheck 7-10 days.

## 2013-01-20 NOTE — Telephone Encounter (Signed)
Pt called after hours line to notify that he is having increased abdominal pain.  Has been vomiting as well, non bloody/nonbilious, but has had bowel movements w/o blood.  Pt w/ extensive PMHx for multiple abdominal surgeries with repairs of ventral hernias.  He will need another ventral hernia repair eventually in the near future.  Denies fever, chills, dysuria.  He is currently on the way to the ED at [facility] for further evaluation.  Does have appointment scheduled with Dr. [name] in the AM.   [name]. Timothy Tabor, DO of [facility] 01/20/2013, 2:30 AM

## 2013-01-20 NOTE — Telephone Encounter (Signed)
Left message to return call, please tell patient he should only take 1 tab daily, NOT 2, of the potasium.Busick, Rodena Medin

## 2013-01-20 NOTE — Telephone Encounter (Signed)
Please call and tell the patient that he should only take 1 pill per day for 7 days. Sorry for confusion.

## 2013-01-20 NOTE — ED Notes (Signed)
PT. REPORTS MID ABDOMINAL PAIN WITH NAUSEA / VOMITTING FOR 1 WEEK , DENIES DIARRHEA /FEVER OR CHILLS.

## 2013-01-20 NOTE — Telephone Encounter (Signed)
Forward to PCP to clarify instructions

## 2013-01-20 NOTE — ED Provider Notes (Signed)
History    CSN: [id number] Arrival date & time 01/20/13  7829  First MD Initiated Contact with Patient 01/20/13 [phone]     Chief Complaint  Patient presents with  . Abdominal Pain   (Consider location/radiation/quality/duration/timing/severity/associated sxs/prior Treatment) HPI  47 year old male with a history of colon resection in July of 2013 with 3 repairs since, last being 07/23/2012 presents emergency department  complaining of abdominal pain and requesting help with pain control. He has had significant intraabdominal surgeries and needs to have multiple large hernias repaired. The hernia repairs are scheduled for 2 months from now after he has completely healed from his previous surgery. He denies having nausea or vomiting. He says he has had 7 normal bowel movements in the past 24 hours. He said the pain sometimes gets so bad he needs extra help. Gets rx for 60 5-325 percocets per month. Has appointment to see his PCP Dr. [name] today at 9:30am.   Past Medical History  Diagnosis Date  . Hypoglycemia   . GERD (gastroesophageal reflux disease)   . Diverticulosis     By colonoscopy June 2005  . Lower GI bleed     June 2005. Presumably secondary to diverticulosis.  . Anemia   . Anxiety   . Alcohol abuse   . Hemorrhoids   . Family history of anesthesia complication     Mother N/V  . Bronchitis     history  . Cellulitis     - left knee-2009  . Glaucoma syndrome     does not use eye drops, "They burn".  . Sleep apnea     witness apnea by wife.  . Elevated CK 3/13  . Depression   . Incisional hernia   . Prinzmetal angina   . History of stomach ulcers 2005  . Headache(784.0)     "alot; not daily" (07/23/2012)  . Migraines     "not often" (07/23/2012)  . Condyloma 02/04/2012   Past Surgical History  Procedure Laterality Date  . Appendectomy  1982  . Elbow fracture surgery  ~ 1975    left;  pins inserted   . Wisdom tooth extraction  ~ 1983  . Laparoscopic left colon  resection  02/19/2012    SIGMOID  . Wart fulguration  02/19/2012    Procedure: FULGURATION ANAL WART;  Surgeon: Robyne Askew, MD;  Location: [facility] OR;  Service: General;  Laterality: N/A;  Destroy  Anal Condyloma   . Laparotomy  02/27/2012    Procedure: EXPLORATORY LAPAROTOMY;  Surgeon: Robyne Askew, MD;  Location: [facility] OR;  Service: General;  Laterality: N/A;  . Colostomy  02/27/2012    Procedure: COLOSTOMY;  Surgeon: Robyne Askew, MD;  Location: [facility] OR;  Service: General;  Laterality: N/A;  . Colon resection  02/27/2012    Procedure: COLON RESECTION;  Surgeon: Robyne Askew, MD;  Location: [facility] OR;  Service: General;  Laterality: N/A;  . Colostomy takedown  07/23/2012  . Abdominal exploration surgery  07/23/2012    w/LOA (07/23/2012)  . Colostomy takedown  07/23/2012    Procedure: COLOSTOMY TAKEDOWN;  Surgeon: Robyne Askew, MD;  Location: [facility] OR;  Service: General;  Laterality: N/A;  Primary Anastomosis  . Lesion destruction  07/23/2012    Procedure: DESTRUCTION LESION ANUS;  Surgeon: Robyne Askew, MD;  Location: [facility] OR;  Service: General;  Laterality: N/A;  DESTRUCTION ANAL CONDYLOMA  . Lysis of adhesion  07/23/2012    Procedure: LYSIS  OF ADHESION;  Surgeon: Robyne Askew, MD;  Location: [facility] OR;  Service: General;  Laterality: N/A;  . Laparotomy  07/23/2012    Procedure: EXPLORATORY LAPAROTOMY;  Surgeon: Robyne Askew, MD;  Location: [facility] OR;  Service: General;  Laterality: N/A;   Family History  Problem Relation Age of Onset  . Diabetes Mother   . Parkinsonism Mother   . Depression Mother   . Alcohol abuse Father   . Hypertension Father   . Breast cancer Maternal Aunt   . Stroke Neg Hx   . Heart disease Neg Hx   . Anxiety disorder Father   . Ulcers Father   . Colon polyps Father   . Diabetes Mother   . Diabetes Maternal Grandmother    History  Substance Use Topics  . Smoking status: Current Every Day Smoker -- 2.00 packs/day for 27 years    Types: Cigarettes  . Smokeless tobacco:  Former User    Types: Chew     Comment: 07/23/2012 "raely uses dip"  . Alcohol Use: No     Comment: h/o 24 PK/ WEEKEND; 07/23/2012 "sober since 02/12/2013"    Review of Systems  Gastrointestinal: Positive for abdominal pain.  All other systems reviewed and are negative.    Allergies  Ambien; Darvocet; Morphine and related; and Bactroban  Home Medications   Current Outpatient Rx  Name  Route  Sig  Dispense  Refill  . acetaminophen (TYLENOL) 500 MG tablet   Oral   Take 1,500 mg by mouth every 6 (six) hours as needed for pain.         . ARIPiprazole (ABILIFY) 5 MG tablet   Oral   Take 5 mg by mouth daily. Take 1/2 a pill for two weeks and then a full pill per Dr. Kathrynn Running in [facility].         . busPIRone (BUSPAR) 15 MG tablet   Oral   Take 15 mg by mouth 3 (three) times daily.         . diazepam (VALIUM) 5 MG tablet   Oral   Take 5 mg by mouth 2 (two) times daily as needed for anxiety.          Marland Kitchen doxycycline (VIBRA-TABS) 100 MG tablet   Oral   Take 1 tablet (100 mg total) by mouth 2 (two) times daily.   30 tablet   2   . HYDROcodone-acetaminophen (NORCO/VICODIN) 5-325 MG per tablet   Oral   Take 2 tablets by mouth every 6 (six) hours as needed for pain.   13 tablet   0   . ondansetron (ZOFRAN ODT) 4 MG disintegrating tablet   Oral   Take 1 tablet (4 mg total) by mouth every 8 (eight) hours as needed for nausea.   20 tablet   0   . polyethylene glycol (MIRALAX / GLYCOLAX) packet   Oral   Take 17 g by mouth daily.         Marland Kitchen EXPIRED: divalproex (DEPAKOTE) 500 MG DR tablet   Oral   Take 1,000 mg by mouth daily with breakfast.           BP 172/88  Pulse 101  Temp(Src) 98.4 F (36.9 C) (Oral)  Resp 18  SpO2 98% Physical Exam  Nursing note and vitals reviewed. Constitutional: He appears well-developed and well-nourished. No distress.  HENT:  Head: Normocephalic and atraumatic.  Eyes: Pupils are equal, round, and reactive to light.  Neck: Normal range  of  motion. Neck supple.  Cardiovascular: Normal rate and regular rhythm.   Pulmonary/Chest: Effort normal.  Abdominal: Soft. There is tenderness. There is no rigidity, no guarding, no tenderness at McBurney's point and negative Murphy's sign. A hernia is present.    Neurological: He is alert.  Skin: Skin is warm and dry.    ED Course  Procedures (including critical care time) Labs Reviewed  URINALYSIS, ROUTINE W REFLEX MICROSCOPIC - Abnormal; Notable for the following:    APPearance CLOUDY (*)    All other components within normal limits  CBC WITH DIFFERENTIAL - Abnormal; Notable for the following:    RBC 3.99 (*)    Hemoglobin 12.5 (*)    HCT 37.2 (*)    RDW 17.4 (*)    All other components within normal limits  COMPREHENSIVE METABOLIC PANEL - Abnormal; Notable for the following:    Potassium 3.1 (*)    Glucose, Bld 139 (*)    Albumin 3.4 (*)    Total Bilirubin 0.2 (*)    All other components within normal limits  LIPASE, BLOOD   No results found. 1. Abdominal pain     MDM  Patient says this is a normal exacerbation of his pain. He is not concerned with pain or wanting work-up. He said that he is already feeling a bit better and is going to see his doctor in a couple of hours will get pain Rx from him.  Labs not acute. Pain is much better, abd soft, do not feel that he needs imaging at this time.  47 y.o.Denzal Manchester's evaluation in the Emergency Department is complete. It has been determined that no acute conditions requiring further emergency intervention are present at this time. The patient/guardian have been advised of the diagnosis and plan. We have discussed signs and symptoms that warrant return to the ED, such as changes or worsening in symptoms.  Vital signs are stable at discharge. Filed Vitals:   01/20/13 0254  BP: 172/88  Pulse: 101  Temp: 98.4 F (36.9 C)  Resp: 18    Patient/guardian has voiced understanding and agreed to follow-up with the PCP or  specialist.   Dorthula Matas, PA-C 01/20/13 [phone]

## 2013-01-20 NOTE — Patient Instructions (Addendum)
1. We will increase your pain medication to 10-325 mg every 8 hours as needed. Stop all tylenol.  2. Continue with the Zofran as needed. 3. I will try to contact Dr. Carolynne Edouard to see what he thinks about an evaluation by a gastroenterologist.  4. Also, we will treat your potassium today. It is low at 3.1. I would like it to be close to 4.0.   Come back in 1 week. I will be praying for your daughter and your family.   Dr. [name]

## 2013-01-20 NOTE — Telephone Encounter (Signed)
Patient is calling on the Potassium instructions.  He thought that Dr. [name] wanted him to take for 7 days, but there are only 7 pills that he will take twice daily which will only last about 2/12 days.  He would just like some clarification please.

## 2013-01-21 NOTE — ED Provider Notes (Signed)
Medical screening examination/treatment/procedure(s) were performed by non-physician practitioner and as supervising physician I was immediately available for consultation/collaboration.   Laray Anger, DO 01/21/13 1945

## 2013-01-27 ENCOUNTER — Telehealth: Payer: Self-pay | Admitting: Family Medicine

## 2013-01-27 NOTE — Telephone Encounter (Signed)
I spoke with patient's wife who stated that he is vomiting and has not had a bowel movement in 5 days. The vomiting is similar in quality and frequency to his persistent vomiting of several weeks duration. He is passing gas but no stool. She called the general surgery physician on call who suggested enemas. They have tried fleets enemas x 2 without improvement. I suggested using suppositories and trying to drink as much fluid as possible. I encouraged follow up in our clinic tomorrow. Patient and wife agreeable to this plan.

## 2013-01-27 NOTE — Telephone Encounter (Signed)
Received call on Emergency line. Patient states is having issues with his chronic abdominal pain and is waiting for surgery for hernias sometime in the next 2 months. Had bowel resection for diverticulitis in July 2013 and reanastomosis in January 2014. Per last clinic note he has had a complicated post-op course of nausea, vomiting, and abdominal pain, now with multiple hernias. Most recent CT abd was negative for ileus and obstruction, but did reveal the hernias. Also states has been vomiting for weeks now and has been trying his zofran with some relief. Notes he had a "low grade" fever of 99.1. Advised patient that his temp of 99.1 was not a fever. Given persistence of pain with new pain med regimen (norco 10-325 PO q8 prn) I advised the patient to call the office when they open to set up an appointment to be evaluated for worrisome causes of abdominal pain.

## 2013-01-27 NOTE — Telephone Encounter (Signed)
Spoke with patient to get a little more information.  Pt states the following:  1. No BM in 2 days and a low grade fever (99.1) 2. Positive for flatulence 3. Nausea/vomiting but meds help and he feels better after vomitting 4. Sharp abdominal pains 7 out of 10   Per Dr. Birdie Sons pt was to come in today and be evaluated, but pt did not want to do that because he feels like he is "always running to the ED, where they give me some Dilaudid for the pain and then send me home"  Advised I would send message to MD, but pt is agreeable to call and make same day appt tomorrow if still no BM. Fleeger, Maryjo Rochester

## 2013-01-27 NOTE — Telephone Encounter (Signed)
Pt is requesting that Dr. [name] call him JW

## 2013-01-28 ENCOUNTER — Telehealth (INDEPENDENT_AMBULATORY_CARE_PROVIDER_SITE_OTHER): Payer: Self-pay | Admitting: General Surgery

## 2013-01-28 NOTE — Telephone Encounter (Signed)
Patient called this AM to let me know that he had several bowel movements overnight and is feeling much better. Has appt with me on Monday.

## 2013-01-28 NOTE — Telephone Encounter (Signed)
Having nausea after meals.  Passing flatus but no bm's.  Has tried several doses of miralax with no success.  Instructed pt to try an enema.  If no results, will call back in the am.

## 2013-02-01 ENCOUNTER — Encounter: Payer: Self-pay | Admitting: Family Medicine

## 2013-02-01 ENCOUNTER — Ambulatory Visit (INDEPENDENT_AMBULATORY_CARE_PROVIDER_SITE_OTHER): Payer: Medicaid Other | Admitting: Family Medicine

## 2013-02-01 VITALS — BP 166/112 | HR 92 | Temp 98.4°F | Ht 70.0 in | Wt 171.0 lb

## 2013-02-01 DIAGNOSIS — R112 Nausea with vomiting, unspecified: Secondary | ICD-10-CM

## 2013-02-01 DIAGNOSIS — F4323 Adjustment disorder with mixed anxiety and depressed mood: Secondary | ICD-10-CM

## 2013-02-01 DIAGNOSIS — F1010 Alcohol abuse, uncomplicated: Secondary | ICD-10-CM

## 2013-02-01 DIAGNOSIS — E876 Hypokalemia: Secondary | ICD-10-CM

## 2013-02-01 DIAGNOSIS — F410 Panic disorder [episodic paroxysmal anxiety] without agoraphobia: Secondary | ICD-10-CM

## 2013-02-01 DIAGNOSIS — K439 Ventral hernia without obstruction or gangrene: Secondary | ICD-10-CM

## 2013-02-01 DIAGNOSIS — I10 Essential (primary) hypertension: Secondary | ICD-10-CM

## 2013-02-01 LAB — BASIC METABOLIC PANEL
BUN: 13 mg/dL (ref 6–23)
Calcium: 9.8 mg/dL (ref 8.4–10.5)
Glucose, Bld: 106 mg/dL — ABNORMAL HIGH (ref 70–99)
Sodium: 140 mEq/L (ref 135–145)

## 2013-02-01 MED ORDER — DIAZEPAM 5 MG PO TABS: 5.0000 mg | ORAL_TABLET | Freq: Three times a day (TID) | ORAL | Status: DC | PRN

## 2013-02-01 MED ORDER — LISINOPRIL 10 MG PO TABS: 10.0000 mg | ORAL_TABLET | Freq: Every day | ORAL | Status: DC

## 2013-02-01 NOTE — Patient Instructions (Signed)
Dear Mr. Zaccone,   Thank you for coming to clinic today. Please read below regarding the issues that we discussed.   1. Pain - Continue current regimen and follow up for pain refill. Always use the miralax.   2. Anxiety - Please use the diazepam as needed, but only when really needed. Keep the journal as well. That is a great thing.   3. Nausea and Vomiting - I will refer you to a GI doctor.   4. Abilify - Please address this with Dr. Kathrynn Running.   Please follow up in clinic in 1 week. Please call earlier if you have any questions or concerns.   Sincerely,   Dr. [name]

## 2013-02-01 NOTE — Progress Notes (Signed)
Subjective:    Patient ID: Timothy [name], male    DOB: 06/07/1966, 47 y.o.   MRN: [id number]  HPI  47 year old M with bipolar disorder, tobacco abuse, and abdominal pain s/p bowel resection for recurrent diverticulitis in July 2013 and reanastomosis 07/23/12.    1. Abdominal Pain/Nausea/Vomiting: He has had a complicated course of persistent nausea, vomiting and adominal pain. He now has multiple abdominal wall hernias. He has been evaluated numerous times recently for this pain and emesis. A CT scan on on 12/30/12 was negative for ileus or obstruction, but did demonstrate the hernias. Dr. Carolynne Edouard, his general surgeon, is planning a surgery to repair the hernias in approximately 3 months. On 01/20/13, the patient went to the ED for severe pain and was discharged. Last week, the patient had several days of constipation likely due to opoid use, which resolved with a suppository.   Today the patient notes: - Pain stable, taking 3 tabs of norco 10-325 daily  - Cramping in abdomen improved over the weekend which he attributes to not taking abilify (started 01/13/13), took the  5 mg pill today and thinks that cramping is worse  - Vomiting: vomited only once in last 48 hours  - Constipation: occurred last week and has resolved with use of a suppository  -  Appetite reduced   2. Hypertension  Patient with no history of hypertension, but increasingly elevated BP's over last 2 months which have been difficult to interpret due to persistent abdominal pain and stress;  Office BP: BP Readings from Last 3 Encounters:  02/01/13 166/112  01/20/13 164/103  01/20/13 172/88    Prescribed meds: none  Hypertension ROS:  Taking medications as prescribed:n/a Chest pain: No Shortness of breath: No Swelling of extremities: No TIA symptoms: No Substance Use: patient notes June 25 to be one year alcohol free, also notes no marijuana, cocaine or recreational drugs; only caffeine is 1-2 cups of coffee in the AM; has  stopped mountain dew and is trying to reduce smoking with an electronic cigarette   3. Anxiety - Pt feels that anxiety is worsening due to financial difficulties - his Zenaida Niece is going to be re-possessed; Family and wife and large stressors as well; repeatedly says "my nerves is shot" - He has been relieved that his step-daughter no longer has a tumor according to her doctors at [facility] - Current anxiety regimen:    > Abilify: taking intermittently due to concern for abdominal pain   > Diazepam taking 2 times per day (last filled 01/10/13, # 40 tabs with 0 refills)    > Buspar 15 mg PO 2 times per day - Behavior techniques: patient is using journaling technique encouraged by Dr. Pascal Lux and brings in journal today; has meeting with her on 7/17  Substance Use: patient notes June 25 to be one year alcohol free, also notes no marijuana, cocaine or recreational drugs; only caffeine is 1-2 cups of coffee in the AM; has stopped mountain dew and is trying to reduce smoking with an electronic cigarette    Review of Systems See HPI    Objective:   Physical Exam BP 166/112  Pulse 92  Temp(Src) 98.4 F (36.9 C) (Oral)  Ht 5\' 10"  (1.778 m)  Wt 171 lb (77.565 kg)  BMI 24.54 kg/m2  Gen: middle age WM, cannot sit comfortable due to abdomen Neck: no carotid bruits CV: RRR, no murmurs Abd: protrusion of abdominal hernia, hypoactive bowel sounds, no abdominal bruit noted  Neuro: mild generalize tremoulousness      Assessment & Plan:

## 2013-02-02 ENCOUNTER — Other Ambulatory Visit: Payer: Self-pay | Admitting: Family Medicine

## 2013-02-02 NOTE — Assessment & Plan Note (Addendum)
Assessment: persistently elevated BP now HTN without clear secondary etiology, but likely multifactorial from pain, stress, tobacco, caffeine; consider hyperaldosteronism if K+ continually low Plan: Start lisinopril 10 mg daily, counseled on cough and angioedema risks; check renal function in 2 weeks

## 2013-02-02 NOTE — Assessment & Plan Note (Signed)
Patient with worsening anxiety due to stressors in his life. He is not taking abilify as directed now due to concern for that worsening his abdominal pain. I attempted to wean diazepam in May, but it has been very challenging for the patient. He is doing well by reducing his caffeine intake.  - Refill diazepam 5 mg PO TID PRN # 60, 1 refill; instructed the patient that these are PRN and 60 pills need to last 1 month - Cont buspar - F/u in mood disorder clinic - Continue journal of feelings

## 2013-02-02 NOTE — Assessment & Plan Note (Signed)
Assessment: persistent; still of unclear etiology but improved with decreased stool burden and complicated by hernia and chronic pain Plan: Given the persistent nature of this, I will make a referral to gastroenterology to rule out any upper GI obstruction

## 2013-02-04 ENCOUNTER — Telehealth: Payer: Self-pay | Admitting: *Deleted

## 2013-02-04 ENCOUNTER — Ambulatory Visit (INDEPENDENT_AMBULATORY_CARE_PROVIDER_SITE_OTHER): Payer: Medicaid Other | Admitting: Family Medicine

## 2013-02-04 ENCOUNTER — Ambulatory Visit (INDEPENDENT_AMBULATORY_CARE_PROVIDER_SITE_OTHER): Payer: Medicaid Other | Admitting: Psychology

## 2013-02-04 ENCOUNTER — Encounter: Payer: Self-pay | Admitting: Family Medicine

## 2013-02-04 VITALS — BP 130/80 | HR 78 | Ht 70.0 in | Wt 169.0 lb

## 2013-02-04 DIAGNOSIS — G8929 Other chronic pain: Secondary | ICD-10-CM

## 2013-02-04 DIAGNOSIS — R1013 Epigastric pain: Secondary | ICD-10-CM

## 2013-02-04 DIAGNOSIS — Z716 Tobacco abuse counseling: Secondary | ICD-10-CM

## 2013-02-04 DIAGNOSIS — F17200 Nicotine dependence, unspecified, uncomplicated: Secondary | ICD-10-CM

## 2013-02-04 DIAGNOSIS — F319 Bipolar disorder, unspecified: Secondary | ICD-10-CM

## 2013-02-04 DIAGNOSIS — Z7189 Other specified counseling: Secondary | ICD-10-CM

## 2013-02-04 MED ORDER — HYDROCODONE-ACETAMINOPHEN 10-325 MG PO TABS: 1.0000 | ORAL_TABLET | Freq: Four times a day (QID) | ORAL | Status: DC | PRN

## 2013-02-04 MED ORDER — HYDROCODONE-ACETAMINOPHEN 10-325 MG PO TABS: 1.0000 | ORAL_TABLET | Freq: Three times a day (TID) | ORAL | Status: DC | PRN

## 2013-02-04 NOTE — Patient Instructions (Signed)
I will give you 2 months worth of pain medication to be filled on the February 19, 2013. Then, you will have one refill for September 1st. For you blood pressure, I am glad that it is improved. I need to check your kidney function at your next visit to make sure you can tolerate the medication.   Please come back in the first week of August. Tell your family that I said hello.   Sincerely,   Dr. [name]

## 2013-02-04 NOTE — Progress Notes (Signed)
Timothy [name] presents for follow-up.  He had stopped the Abilify this past Saturday and Sunday secondary to cramping in his stomach.  He restarted at 5 mg on Monday and has not had this issue again.  He finished an antibiotic in this time frame as well and thinks this might have been the issue.    Feels better on the Abilify.  "Likes it" and thinks he is able to be more patient and engaged.  Says [name] has noticed a difference.  Has stopped Diet Caffeine Free [address] and is drinking one Diet [facility] every few days.  Mostly drinks water and a cup of coffee in the morning.  Smoking about 1 pack of cigarettes a day - down from 3 packs.  Using an electronic cigarette.  Wants to quit before his next stomach surgery.

## 2013-02-04 NOTE — Progress Notes (Signed)
  Subjective:    Patient ID: Timothy [name], male    DOB: Nov 27, 1965, 47 y.o.   MRN: [id number]  HPI  47 year old M with bipolar disorder, tobacco abuse, and abdominal pain s/p bowel resection for recurrent diverticulitis in July 2013 and reanastomosis 07/23/12. He was seen on 7/15 for his constant nausea, vomiting, as well as anxiety and hypertension . Today he would like to discuss pain medical refill and ear pain.   Abdominal Pain, Chronic - Patient with chronic abdominal pain due to post-surgical complications; Has been on stable regimen of hydrocodone-acetaminophen 10-325 mg PO q 8 PRN; last filled on 01/20/13 # 90 with 0 refills, but patient presenting today refill of medications since I will be out of the country during the last week of August - Notes that pain in abdomen is still present but controlled with medication - Has had adverse effects of constipation which resolved with suppositories and now using miralax 34 g daily and having soft BM multiple times per day  Ear Pain - Left sided - Started this morning - No fever, nausea, no decreased hearing - No recent swimming - No history of deafness, ear surgery or recurrent infections   Review of Systems     Objective:   Physical Exam BP 130/80  Pulse 78  Ht 5\' 10"  (1.778 m)  Wt 169 lb (76.658 kg)  BMI 24.25 kg/m2  Gen: middle age WM, non distressed, more well appearing that 2 days ago  Left Ear: no erythema, no otalgia, TM normal, no lymphadenopathy, no TMJ swelling  Abd: distended from multiple ventral hernias that are reducible, hypoactive bowel sounds, non-tender     Assessment & Plan:  Continue current regimen for abdominal pain, given Rx hydrocodone-acetominophen 10-325 mg # 90 to be filled on 02/19/13 and separate script for the same medication and quantity to be filled on 03/20/13.   Ear pain without evidence of infection, effusion, or injury. Continue close observation.

## 2013-02-04 NOTE — Assessment & Plan Note (Signed)
Timothy [name] looks well to me today.  Report of mood is euthymic.  Affect is consistent.  Thoughts are less depressed and irritable.  He has made considerable behavioral changes with regards to substance use and continues to make adjustments.  He is working on changes in his relationships.  He is not interested at this point in doing anything too active with regards to improving his marital relationship.  He wants to continue to work on being more patient, less sarcastic / hostile and see if this produces some changes in the dynamic.  Did make a recommendation with regards to parenting his son and the blended family dynamic.    One major coping mechanism for him is time alone to tinker.  He needs time away from people to relax and think.  This is working well for him.  Will follow on July 29th at 9:00 for beh med and then August 6th for MDC.

## 2013-02-04 NOTE — Telephone Encounter (Signed)
I called the pharmacy and instructed them that medication should be filled on dates specified on the prescription and not before. Additionally, both prescriptions should be for medication q 8 hours PRN.

## 2013-02-04 NOTE — Telephone Encounter (Signed)
Pt was given two Rx's today for hydrocodone with two different fill dates for the same medication but different directions.  Pharmacy calling to verify directions.  Will fwd to Md.  Emmilee Reamer, Darlyne Russian, CMA

## 2013-02-04 NOTE — Assessment & Plan Note (Signed)
Working on quitting.  Wants to quit by his next surgery.  Down to 1 ppd.  Using an electronic cigarette.

## 2013-02-15 ENCOUNTER — Telehealth: Payer: Self-pay | Admitting: Family Medicine

## 2013-02-15 ENCOUNTER — Ambulatory Visit (INDEPENDENT_AMBULATORY_CARE_PROVIDER_SITE_OTHER): Payer: Medicaid Other | Admitting: Family Medicine

## 2013-02-15 ENCOUNTER — Encounter: Payer: Self-pay | Admitting: Family Medicine

## 2013-02-15 VITALS — BP 136/86 | HR 80 | Temp 97.8°F | Ht 70.0 in | Wt 170.0 lb

## 2013-02-15 DIAGNOSIS — J209 Acute bronchitis, unspecified: Secondary | ICD-10-CM | POA: Insufficient documentation

## 2013-02-15 DIAGNOSIS — G8929 Other chronic pain: Secondary | ICD-10-CM

## 2013-02-15 DIAGNOSIS — R1013 Epigastric pain: Secondary | ICD-10-CM

## 2013-02-15 DIAGNOSIS — K439 Ventral hernia without obstruction or gangrene: Secondary | ICD-10-CM

## 2013-02-15 HISTORY — DX: Acute bronchitis, unspecified: J20.9

## 2013-02-15 MED ORDER — ALBUTEROL SULFATE HFA 108 (90 BASE) MCG/ACT IN AERS: 2.0000 | INHALATION_SPRAY | RESPIRATORY_TRACT | Status: DC | PRN

## 2013-02-15 MED ORDER — HYDROCODONE-HOMATROPINE 5-1.5 MG/5ML PO SYRP: 5.0000 mL | ORAL_SOLUTION | Freq: Three times a day (TID) | ORAL | Status: DC | PRN

## 2013-02-15 MED ORDER — AZITHROMYCIN 250 MG PO TABS: ORAL_TABLET | ORAL | Status: DC

## 2013-02-15 MED ORDER — HYDROCODONE-ACETAMINOPHEN 10-325 MG PO TABS: 1.0000 | ORAL_TABLET | Freq: Three times a day (TID) | ORAL | Status: DC | PRN

## 2013-02-15 NOTE — Patient Instructions (Addendum)
Your cough and chest congestion is likely caused by a bronchitis.  If you start having worsening shortness of breath, worsening chills and fevers, return to the clinic.   Follow up with Dr. [name] for your next appointment.

## 2013-02-15 NOTE — Telephone Encounter (Signed)
Called pharmacy. Pharmacist spoke with Dr.Losq. expra hydrocodone (norco) is ok. Cough syrup was changed. Called pt.  Informed. He will now call the pharmacy. Lorenda Hatchet, Renato Battles

## 2013-02-15 NOTE — Assessment & Plan Note (Signed)
Given worsening symptoms and length of symptoms, will treat with azithromycin.  There may also be component of reactive airway associated with this given significant wheezing on lung exam. Albuterol Rx sent in case of wheezing or excess coughing.  For cough which is causing significant stress on abdominal hernia, had originally sent hycodan, but pharmacist was concerned for double dose of hydrocodone given that patient is already on hydrocodone. Discussed this with pharmacist and decided to use guaifenesin with codeine instead.

## 2013-02-15 NOTE — Telephone Encounter (Signed)
Pt called stating that the pharmacy will not fill the hydrocodone-homatrophine because he also has hydrocodone acetaminophen prescription. The hyrdo-acetaminophen is written to be filled on 8/1 and he doesn't understand why he has to wait. JW

## 2013-02-15 NOTE — Progress Notes (Signed)
Patient ID: Timothy [name]    DOB: May 24, 1966, 47 y.o.   MRN: [id number] --- Subjective:  Timothy [name] is a 47 y.o.male with h/o bipolar 1, migraine headache, abdominal wall hernia who presents with cough and chest congestion since Thursday of last week. He reports cough productive of yellow sputum. Rhinorrhea present as well. No difficulty breathing but feeling of chest congestion with chest tightness. Also reports head fullness. Patient has sizeable abdominal hernia and coughing has been making pain worst, for which he has had to take extra vicodin. Cough is worst at night. No fevers. Some chills over the weekend.  His daughter had similar symptoms last week.    ROS: see HPI Past Medical History: reviewed and updated medications and allergies. Social History: Tobacco: current 2 pack per day  Objective: Filed Vitals:   02/15/13 0853  BP: 136/86  Pulse: 80  Temp: 97.8 F (36.6 C)  O2: 99% on RA  Physical Examination:   General appearance - alert, well appearing, and in no distress Ears - bilateral TM's and external ear canals normal Nose - erythematous and congested nasal turbinates bilaterally Mouth - mucous membranes moist, erythematous oropharynx without tonsillar exudates.  Neck - supple, cervical lymph node palpable on right anterior neck Chest - coarse, expiratory wheezing bilaterally throughout lung fields, normal work of breathing, audible congested cough.  Heart - normal rate, regular rhythm, normal S1, S2, no murmurs Abdomen - significant abdominal hernia protruding out with scarring

## 2013-02-15 NOTE — Assessment & Plan Note (Signed)
With excess stress on hernia due to coughing, patient has used extra vicodin. Gave Rx for 10 tablets of vicodin 10/325 until he sees his PCP on August 1st.

## 2013-02-16 ENCOUNTER — Ambulatory Visit (INDEPENDENT_AMBULATORY_CARE_PROVIDER_SITE_OTHER): Payer: Medicaid Other | Admitting: Psychology

## 2013-02-16 DIAGNOSIS — F1011 Alcohol abuse, in remission: Secondary | ICD-10-CM

## 2013-02-16 DIAGNOSIS — F319 Bipolar disorder, unspecified: Secondary | ICD-10-CM

## 2013-02-16 DIAGNOSIS — F17200 Nicotine dependence, unspecified, uncomplicated: Secondary | ICD-10-CM

## 2013-02-16 DIAGNOSIS — Z716 Tobacco abuse counseling: Secondary | ICD-10-CM

## 2013-02-16 DIAGNOSIS — Z7189 Other specified counseling: Secondary | ICD-10-CM

## 2013-02-16 NOTE — Patient Instructions (Addendum)
Please schedule a follow-up for: August 15th at 9:00.   The website I spoke of is:  www.firstpeople.us/FP-[email or web address]-[email or web address]-[email or web address].html or you can Google "story about feeding the wolf."  I have always found this helpful when I am feeling really hateful and angry toward other people.   We talked actively working to find positive in others.  Our brains are geared toward looking for the negative.

## 2013-02-16 NOTE — Assessment & Plan Note (Signed)
Father is coming to visit.  Dad drinks a fair amount.  Discussed impact this might have on Timothy [name].  He does not think he will have difficulty managing it.
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
## 2013-02-16 NOTE — Assessment & Plan Note (Signed)
Continues to work on quitting.  Has not cut back anymore since last appointment.

## 2013-02-16 NOTE — Assessment & Plan Note (Signed)
Report of mood over the last few weeks is variable.  His journal shows a number of days of little to no irritability.  He tends to have fewer days when he awakens irritable and is more triggered by family members / family dysfunction.  Blended families can be a challenge even without the level of individual and family dysfunction present in Timothy [name]'s case.  He has a longer-term plan for managing this and we focused on a potential shorter-term plan today.  He has some definite fixed ideas so using a MI approach to see if there is any room for movement.  Not only does his current approach to the problems hurt others, the amount of negative affect associated with it has to hurt him as well in terms of mood and pain control.    See patient instructions for further plan.

## 2013-02-24 ENCOUNTER — Ambulatory Visit (INDEPENDENT_AMBULATORY_CARE_PROVIDER_SITE_OTHER): Payer: Medicaid Other | Admitting: Psychology

## 2013-02-24 DIAGNOSIS — F319 Bipolar disorder, unspecified: Secondary | ICD-10-CM
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
## 2013-02-24 NOTE — Assessment & Plan Note (Signed)
Mood is reported as euthymic.  Affect is consistent.  Thoughts are clear and goal directed.  He is interested in seeing if he can get better mood control at an increased dose of Abilify and Dr. Kathrynn Running thought that was reasonable.  Encouraged a behavioral strategy around his irritability with naps:  If he must nap, keep it to 60-90 minutes and give himself plenty of time to awaken prior to his family coming home.

## 2013-02-24 NOTE — Patient Instructions (Addendum)
Please schedule a follow-up for:  September 17th at 9:00. Good to see you today Timothy [name].  I am glad to hear you had a good week with your dad and step-mom.  Also glad to hear that you got some things in order. We discussed increasing your Abilify to 7.5 mg.  You thought that seemed like a good idea.  If this feels like too much medicine, go back down to 5 mg.  Feel free to call me.  Side effects you might notice include headache, restless legs or feeling like you have to move. You do seem to have improved mood control based on the journal you have been keeping.  Continuing to keep that journal is a good idea. We talked about your naps.  Keeping them to an hour to an hour and a half and allowing yourself plenty of time to get up before everyone hits the door, might help with your irritability.

## 2013-03-01 ENCOUNTER — Encounter: Payer: Self-pay | Admitting: Family Medicine

## 2013-03-01 ENCOUNTER — Ambulatory Visit (INDEPENDENT_AMBULATORY_CARE_PROVIDER_SITE_OTHER): Payer: Medicaid Other | Admitting: Family Medicine

## 2013-03-01 VITALS — BP 160/90 | HR 83 | Temp 97.1°F | Ht 70.0 in | Wt 172.0 lb

## 2013-03-01 DIAGNOSIS — R7309 Other abnormal glucose: Secondary | ICD-10-CM

## 2013-03-01 DIAGNOSIS — R259 Unspecified abnormal involuntary movements: Secondary | ICD-10-CM

## 2013-03-01 DIAGNOSIS — I10 Essential (primary) hypertension: Secondary | ICD-10-CM

## 2013-03-01 DIAGNOSIS — R739 Hyperglycemia, unspecified: Secondary | ICD-10-CM

## 2013-03-01 DIAGNOSIS — G2571 Drug induced akathisia: Secondary | ICD-10-CM

## 2013-03-01 LAB — BASIC METABOLIC PANEL
BUN: 10 mg/dL (ref 6–23)
CO2: 27 mEq/L (ref 19–32)
Chloride: 103 mEq/L (ref 96–112)
Glucose, Bld: 123 mg/dL — ABNORMAL HIGH (ref 70–99)
Potassium: 3.8 mEq/L (ref 3.5–5.3)
Sodium: 139 mEq/L (ref 135–145)

## 2013-03-01 NOTE — Patient Instructions (Signed)
Dear [name],   Thanks for coming in today. You are experiencing the negative side effects of the increased dose of Abilify. Please do the following things: 1. Skip the dose on 03/02/13 2. Start back at 5 mg on Wednesday 3. Take Benadryl as needed, because this can calm the symptoms  For your blood pressure, I will check the labs today and give you a call about the medication tomorrow or in the morning.   Come back to clinic in 2 weeks.   Sincerely,   Dr. [name]

## 2013-03-01 NOTE — Progress Notes (Signed)
Subjective:    Patient ID: Timothy [name], male    DOB: 1966/04/27, 47 y.o.   MRN: [id number]  HPI  47 year old M with bipolar disorder, tobacco abuse, hypertension and chronic abdominal pain s/p bowel resection for recurrent diverticulitis in July 2013 and reanastomosis 07/23/12.   Mood/Restlesness:   He presents today to discuss his mood. Recently seen by Dr. Pascal Lux and Dr. Kathrynn Running in mood disorder clinic and Abilify increased to 7.5 mg on 02/24/13. For 4 days ago he notes greatly increased activity level and feeling "fidgety". He cannot sit still and always moving. States that he is currently a 2 out of 3 for agitation scale. Wife has noticed a change as well. He also notes increased caffeine and nicotine intake over the weekend. He drank two cups of coffee yesterday and today and smoking approximately 2 packs of cigarettes per day. He is also nervous about this upcoming appointment with Dr. Carolynne Edouard, general surgeon.  Hypertension:  Home BP monitoring:   Office BP: BP Readings from Last 3 Encounters:  03/01/13 160/90  02/15/13 136/86  02/04/13 130/80    Prescribed meds: lisinopril 10 mg daily, started 1 month ago and tolerating well, has not missed any dose  Hypertension ROS:  Taking medications as prescribed:No Chest pain: No Shortness of breath: No Swelling of extremities: No TIA symptoms: No Angioedema: No    Current Outpatient Prescriptions on File Prior to Visit  Medication Sig Dispense Refill  . acetaminophen (TYLENOL) 500 MG tablet Take 1,500 mg by mouth every 6 (six) hours as needed for pain.      Marland Kitchen albuterol (PROVENTIL HFA;VENTOLIN HFA) 108 (90 BASE) MCG/ACT inhaler Inhale 2 puffs into the lungs every 4 (four) hours as needed for wheezing.  1 Inhaler  0  . ARIPiprazole (ABILIFY) 5 MG tablet Take 5 mg by mouth daily. Take 1/2 a pill for two weeks and then a full pill per Dr. Kathrynn Running in [facility].      Marland Kitchen ARIPiprazole (ABILIFY) 5 MG tablet Take 7.5 mg by mouth daily. Take one and  half of a 5 mg pill daily.  Per Dr. Kathrynn Running in [facility].  #45 with 3 refills.      Marland Kitchen azithromycin (ZITHROMAX Z-PAK) 250 MG tablet 500 mg on day 1 followed by 250 mg once daily on days 2-5  6 each  0  . busPIRone (BUSPAR) 15 MG tablet Take 15 mg by mouth 3 (three) times daily.      . diazepam (VALIUM) 5 MG tablet Take 1 tablet (5 mg total) by mouth every 8 (eight) hours as needed for anxiety.  60 tablet  1  . divalproex (DEPAKOTE) 500 MG DR tablet Take 1,000 mg by mouth daily with breakfast.       . doxycycline (VIBRA-TABS) 100 MG tablet Take 1 tablet (100 mg total) by mouth 2 (two) times daily.  30 tablet  2  . [START ON 03/20/2013] HYDROcodone-acetaminophen (NORCO) 10-325 MG per tablet Take 1 tablet by mouth every 6 (six) hours as needed for pain.  90 tablet  0  . HYDROcodone-acetaminophen (NORCO) 10-325 MG per tablet Take 1 tablet by mouth every 8 (eight) hours as needed for pain.  10 tablet  0  . HYDROcodone-homatropine (HYCODAN) 5-1.5 MG/5ML syrup Take 5 mLs by mouth every 8 (eight) hours as needed for cough.  120 mL  0  . lisinopril (PRINIVIL,ZESTRIL) 10 MG tablet Take 1 tablet (10 mg total) by mouth daily.  30 tablet  1  . ondansetron (  ZOFRAN ODT) 4 MG disintegrating tablet Take 1 tablet (4 mg total) by mouth every 8 (eight) hours as needed for nausea.  30 tablet  3  . polyethylene glycol (MIRALAX / GLYCOLAX) packet Take 17 g by mouth daily.       No current facility-administered medications on file prior to visit.      Review of Systems     Objective:   Physical Exam BP 160/90  Pulse 83  Temp(Src) 97.1 F (36.2 C) (Oral)  Ht 5\' 10"  (1.778 m)  Wt 172 lb (78.019 kg)  BMI 24.68 kg/m2  Gen: middle age WM, pleasant and talkative, anxious appearing, constantly moving throughout interview CV: RRR, no murmurs Abd: Abd: distended from multiple ventral hernias that are reducible, hypoactive bowel sounds, non-tender Psych: normal thought content and speech pattern; restless behavior  BMET     Component Value Date/Time   NA 139 03/01/2013 1034   K 3.8 03/01/2013 1034   CL 103 03/01/2013 1034   CO2 27 03/01/2013 1034   GLUCOSE 123* 03/01/2013 1034   BUN 10 03/01/2013 1034   CREATININE 0.64 03/01/2013 1034   CREATININE 0.74 01/20/2013 0312   CALCIUM 9.6 03/01/2013 1034   GFRNONAA >90 01/20/2013 0312   GFRAA >90 01/20/2013 0312         Assessment & Plan:

## 2013-03-02 ENCOUNTER — Encounter: Payer: Self-pay | Admitting: Family Medicine

## 2013-03-02 ENCOUNTER — Telehealth: Payer: Self-pay | Admitting: Psychology

## 2013-03-02 ENCOUNTER — Telehealth: Payer: Self-pay | Admitting: Family Medicine

## 2013-03-02 DIAGNOSIS — G2571 Drug induced akathisia: Secondary | ICD-10-CM | POA: Insufficient documentation

## 2013-03-02 DIAGNOSIS — I10 Essential (primary) hypertension: Secondary | ICD-10-CM

## 2013-03-02 DIAGNOSIS — R739 Hyperglycemia, unspecified: Secondary | ICD-10-CM | POA: Insufficient documentation

## 2013-03-02 MED ORDER — LISINOPRIL 10 MG PO TABS: 20.0000 mg | ORAL_TABLET | Freq: Every day | ORAL | Status: DC

## 2013-03-02 NOTE — Telephone Encounter (Signed)
Timothy [name] called to report he was unable to tolerate the increase to 7.5 mg of Abilify.  He felt jittery and shaky and had trouble sitting still.  He also had significant changes in his sleep pattern.  He saw Dr. [name] yesterday and was told to hold the medicine for a day and then restart 5 mg.  He has done that and is feeling better but still has some shakiness.  He plans to discuss further with me on Friday during his behavioral medicine appointment.

## 2013-03-02 NOTE — Telephone Encounter (Signed)
Please call the patient and tell him that it is fine for him to continue taking the lisinopril. Tell him that I am going to increase the dose to 20 mg daily since his blood pressure has remained high.

## 2013-03-02 NOTE — Addendum Note (Signed)
Addended by: Garnetta Buddy on: 03/02/2013 12:05 PM   Modules accepted: Level of Service

## 2013-03-02 NOTE — Assessment & Plan Note (Signed)
A: Resistant HTN, with continued confounding factors of caffeine, nicotine, physical and emotional stress; I still cannot determine an anatomic or endocrine cause for this HTN P:  - Given normal creatinine, increase lisinopril to 20 mg daily - Consider UDS in future - F/u in 2 weeks and consider CCB

## 2013-03-02 NOTE — Assessment & Plan Note (Signed)
A: Patient with consistently elevated glucose for throughout 2014; Difficult to determine if fasting at these times, but patient is physical pain during numerous visit; Low likelihood of aripiprazole contributing P: Check Hgb A1C at next visit

## 2013-03-02 NOTE — Assessment & Plan Note (Addendum)
A: patient displaying signs of akathisia since increased dose of aripiprazole to 7.5 mg daily P: hold tomorrow's dose and restart at 5 mg on Wednesday if symptomatically improved; patient to meet with Dr. Pascal Lux on on Friday; if feeling better, he can stay at 5 mg or try to titrate dose more slowly with 7.5 mg every other day; I will defer to the mood disorder team unless asked to manage this medication specifically; also, patient instructed to use benadryl PRN for symptom relief; encouraged decreased caffeine intake

## 2013-03-02 NOTE — Telephone Encounter (Signed)
LMOVM for pt to return our call.  Please advise patient of message from MD below.  Kenzleigh Sedam, Darlyne Russian, CMA

## 2013-03-03 NOTE — Telephone Encounter (Signed)
Pt notified.  Tino Ronan L, CMA  

## 2013-03-05 ENCOUNTER — Ambulatory Visit (INDEPENDENT_AMBULATORY_CARE_PROVIDER_SITE_OTHER): Payer: Medicaid Other | Admitting: Psychology

## 2013-03-05 DIAGNOSIS — F319 Bipolar disorder, unspecified: Secondary | ICD-10-CM

## 2013-03-05 NOTE — Progress Notes (Signed)
Timothy [name] presents for follow-up.  He is still feeling a bit anxious / jittery but significantly better since backing off on the Abilify.  As Dr. [name] documented, Timothy [name] developed akathesia after increasing his dose to 7.5 mg.  His mood has remained mostly well managed since the decrease.  Discussed family dynamics and anxiety in general.  Timothy [name] would like an increase in his diazepam dose because he thinks he has grown tolerant to it.  Discussed.

## 2013-03-05 NOTE — Assessment & Plan Note (Signed)
Report of mood is anxious today.  Affect is within normal limits.  He looks relaxed to me.  Smiles appropriately.  Thoughts are clear and goal directed.  Given his context (stressful family situation that he is not going to change presently, chronic medical issues, chronic pain) he thinks his mood is adequately managed and does not want to try to increase the Abilify more slowly.  He does have MDC follow-up in September and we can revisit then.  WIth regards to anxiety, increasing his diazepam is not likely the safest or healthiest strategy given his history.  His mood report lately has been good (with the exception of the recent difficulty with the increase in Abilify).  I don't get a sense that the anxiety is limiting him in any way although it is uncomfortable.  Discussed alternative strategies including reducing his stress (not willing to change relationship status despite the dynamics here) and behavioral techniques like relaxation training.  He doesn't have a CD player at home to play a relaxation CD I have.  Encouraged him to YouTube relaxation training videos and learn techniques such as diaphragmatic breathing.  We can revisit next time if he is interested.

## 2013-03-18 ENCOUNTER — Ambulatory Visit (INDEPENDENT_AMBULATORY_CARE_PROVIDER_SITE_OTHER): Payer: Medicaid Other | Admitting: Family Medicine

## 2013-03-18 ENCOUNTER — Encounter: Payer: Self-pay | Admitting: Family Medicine

## 2013-03-18 VITALS — BP 144/87 | HR 83 | Temp 98.2°F | Ht 70.0 in | Wt 174.5 lb

## 2013-03-18 DIAGNOSIS — F410 Panic disorder [episodic paroxysmal anxiety] without agoraphobia: Secondary | ICD-10-CM

## 2013-03-18 DIAGNOSIS — I10 Essential (primary) hypertension: Secondary | ICD-10-CM

## 2013-03-18 DIAGNOSIS — F4323 Adjustment disorder with mixed anxiety and depressed mood: Secondary | ICD-10-CM

## 2013-03-18 MED ORDER — DIAZEPAM 5 MG PO TABS: 5.0000 mg | ORAL_TABLET | Freq: Three times a day (TID) | ORAL | Status: DC | PRN

## 2013-03-18 MED ORDER — BUSPIRONE HCL 15 MG PO TABS: 15.0000 mg | ORAL_TABLET | Freq: Four times a day (QID) | ORAL | Status: DC

## 2013-03-18 NOTE — Patient Instructions (Addendum)
Timothy [name],   Thanks for coming in today. I know you are going through a miserable time and know you will get through this. It is not going to be easy, but you have proven time and time again that you can do. You will come out stronger for this too. I will be praying for you.   Continue to take the Diazepam 2 times per day only for serious anxiety. Also, please take the Buspar 4 times a day. It will help you.   Come back in 1 week.   Sincerely,   Dr. [name]

## 2013-03-18 NOTE — Progress Notes (Signed)
  Subjective:    Patient ID: Timothy [name], male    DOB: 05-Dec-1965, 47 y.o.   MRN: [id number]  HPI  48 year old M with bipolar disorder, tobacco abuse, hypertension and chronic abdominal pain s/p bowel resection for recurrent diverticulitis in July 2013 and reanastomosis 07/23/12.  Hypertension  Home BP monitoring:   Office BP: BP Readings from Last 3 Encounters:  03/18/13 144/87  03/01/13 160/90  02/15/13 136/86    Prescribed meds: Lisinopril 20 mg  Hypertension ROS:  Taking medications as prescribed:Yes Chest pain: No Shortness of breath: No Swelling of extremities: No TIA symptoms: No  Anxiety/Mood  Worsening anxiety over 2 weeks with chest pressure, agoraphobia, and frustration. Not taking buspar. He is taking diazepam 5 mg and requests that we increase his diazepam, because "I'm immune to it;" Patient thinks anxiety is worsening b/c he has an upcoming appointment with Dr. Carolynne Edouard for a pre-surgical consultation, which he is nervous about; Also, he is worried about his financial situation and providing for his family; He denies any recent alcohol or recreational drug use; states that his support group is really just one friend that he has   Review of Systems See HPI    Objective:   Physical Exam BP 144/87  Pulse 83  Temp(Src) 98.2 F (36.8 C) (Oral)  Ht 5\' 10"  (1.778 m)  Wt 174 lb 8 oz (79.153 kg)  BMI 25.04 kg/m2  Gen: middle aged, WM, non ill appearing Abd: very protuberant, non tender Psych: tearful. Anxious appearing,       Assessment & Plan:

## 2013-03-21 NOTE — Assessment & Plan Note (Signed)
Assessment: Patient with worsening anxiety due to unfortunate health and social situation, unfortunately he is not displaying good coping skills during times of high anxiety and putting too much faith in medication Plan: I counseled that patient extensively that increasing benzodiazepine is not a safe or viable option for him; I encouarged use of buspar since he is not use it . Given refill for Buspar and diazepam to be filled in about 2 weeks. Patient will follow up in mood disorder clinic.

## 2013-03-21 NOTE — Assessment & Plan Note (Signed)
Assessment: Well controlled on lisinopril 20 mg daily Plan: continue meds

## 2013-03-29 ENCOUNTER — Telehealth: Payer: Self-pay | Admitting: Psychology

## 2013-03-29 NOTE — Telephone Encounter (Signed)
Timothy [name] left a VM Sunday asking me to call him as soon as I could on Monday.  Returned his call today and left a VM.

## 2013-03-30 ENCOUNTER — Telehealth: Payer: Self-pay | Admitting: Psychology

## 2013-03-30 NOTE — Telephone Encounter (Signed)
[name] called to tell me he had to cancel his upcoming beh med and MDC appointment secondary to a trip to [facility] to visit his ailing father.  He reports his dad has been blacking out and coughing up blood and has lost 80 or so pounds in a brief period of time.  He describes his dad as his best friend.  He reported feeling helpless and is anxious to get down there to see his dad.  We r/s [facility] appointment for 10/15 at 9:00.  He will call from [facility] to let me know about rescheduling therapy appointment.  He reports he has enough medication to get him to his [facility] appointment.    He reported his anxiety has been very high; having trouble especially around crowds.  Preferring to stay home and watch television.  Expressed that while avoiding crowds is likely the easiest thing to do in the moment, it likely sets him up for further discomfort down the road.  Pushing himself in certain instances and relying on behavioral strategies like breathing and coping statements may be necessary if he does not want this to get worse.  However, the most important thing right now is getting to his father.  Will follow when he is able.

## 2013-03-31 ENCOUNTER — Ambulatory Visit: Payer: Medicaid Other | Admitting: Family Medicine

## 2013-03-31 ENCOUNTER — Other Ambulatory Visit: Payer: Self-pay | Admitting: Family Medicine

## 2013-03-31 DIAGNOSIS — I10 Essential (primary) hypertension: Secondary | ICD-10-CM

## 2013-03-31 MED ORDER — LISINOPRIL 10 MG PO TABS: 20.0000 mg | ORAL_TABLET | Freq: Every day | ORAL | Status: DC

## 2013-04-02 ENCOUNTER — Ambulatory Visit: Payer: Medicaid Other | Admitting: Family Medicine

## 2013-04-02 ENCOUNTER — Telehealth: Payer: Self-pay | Admitting: Family Medicine

## 2013-04-02 ENCOUNTER — Ambulatory Visit: Payer: Medicaid Other | Admitting: Psychology

## 2013-04-02 NOTE — Telephone Encounter (Signed)
Patient calling due to severe anxiety given his father was diagnosed today with invasive stage stage IV cancer at a hospital in [place]. Timothy [name] is overwhelmed by the diagnosis, because he describes his father as his "best friend." He is doing his best to manage anxiety but feels like he is "shaky" and want to know what can be done to improve this. I encouraged him that there is no medication that can change the situation or make this very difficult time simple to deal with. I encouraged him to continue with his current medical regimen and more importantly to work on coping skills with prayers, breathing, and talking to spiritual counselors at the hospital. He is in agreement with this plan.

## 2013-04-05 ENCOUNTER — Telehealth: Payer: Self-pay | Admitting: Psychology

## 2013-04-05 NOTE — Telephone Encounter (Signed)
[name] called and left a VM on Friday telling me about his father's diagnosis of lung cancer.  He spoke with Dr. [name] and developed a plan.  I called him back today and he sounded like he was managing his emotions reasonably well.  Per his report, he is over using his diazepam a little.  Cautioned him about running out of his medicine and the risk of withdrawal.  He voiced an understanding.  Acknowledged that this is a very hard experience and reminded him that he can do hard things.  There is no way around this and tolerating / managing his negative emotions is an important part of the process.  He thinks Hospice will be involved and hopefully they can provide some family support.  [name] and the kids are driving down Thursday.  I asked him to provide me updates as he is able.

## 2013-04-06 ENCOUNTER — Ambulatory Visit: Payer: Medicaid Other | Admitting: Gastroenterology

## 2013-04-07 ENCOUNTER — Ambulatory Visit: Payer: Medicaid Other | Admitting: Psychology

## 2013-04-07 ENCOUNTER — Telehealth: Payer: Self-pay | Admitting: Psychology

## 2013-04-07 NOTE — Telephone Encounter (Signed)
Timothy [name] called and left a VM requesting an appointment for next week.  I called him back and left a VM.  I don't have anything next week but offered two opportunities the following week.  Will await his follow-up.

## 2013-04-07 NOTE — Telephone Encounter (Signed)
Connected via phone.  He scheduled for 10/3 at 8:30.  Will call with updates next week as needed.  Sees Dr. [name] next week.

## 2013-04-09 IMAGING — CT CT ABD-PELV W/ CM
2 of 5 series · 17 of 46 positions shown, 19 images · IV contrast (APPLIED)
Comparison: 03/03/2012 and 03/02/2012 CT.

CLINICAL DATA: Followup abdominal drain placement 03/03/2012.
Patient feeling better better.  No more drainage. Surgery for
diverticular rupture.

CT ABDOMEN AND PELVIS WITH CONTRAST
TECHNIQUE: Multidetector CT imaging of the abdomen and pelvis was
performed following the standard protocol during bolus
administration of intravenous contrast.
Contrast: 100mL OMNIPAQUE IOHEXOL 300 MG/ML  SOLN

[Series 2: abd/pelv with 5.0 b31f st · axial · 0.70mm/px · z∈[-516,-66]mm · 14 of 102 slices shown, 16 images]
[im 6/102  soft-tissue]
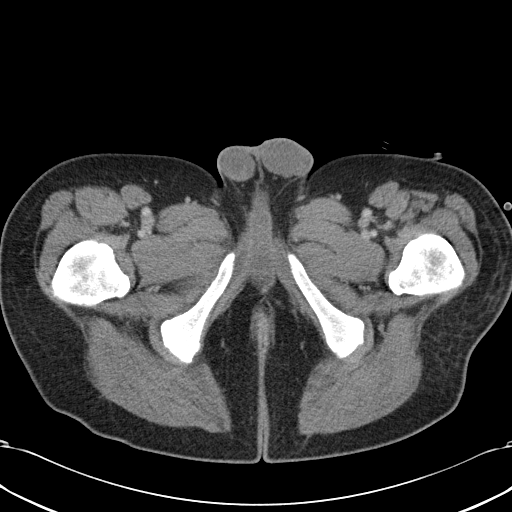
[im 6/102  bone]
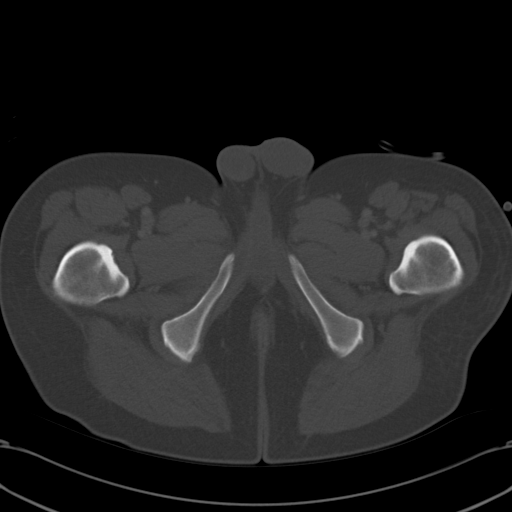
[im 16/102  soft-tissue]
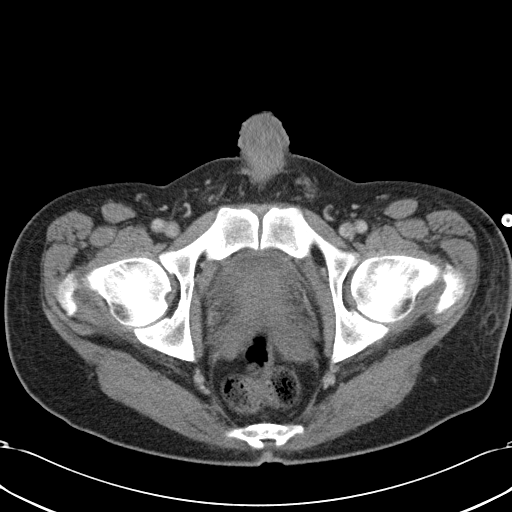
[im 21/102  soft-tissue]
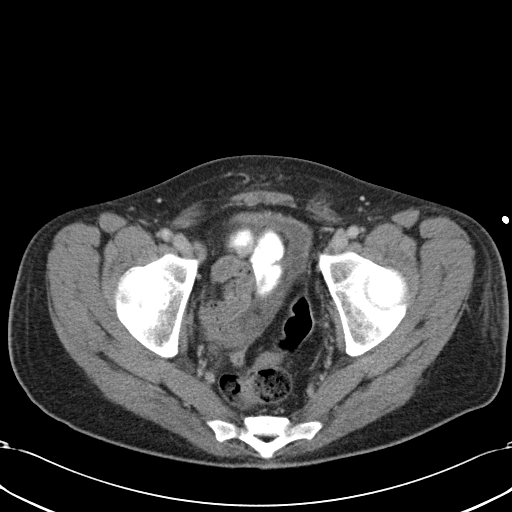
[im 26/102  soft-tissue]
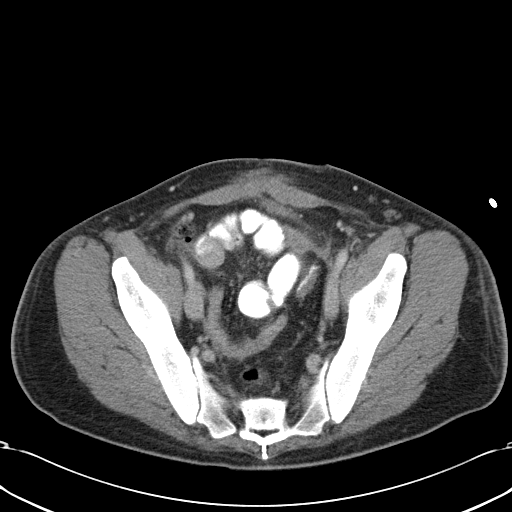
[im 36/102  soft-tissue]
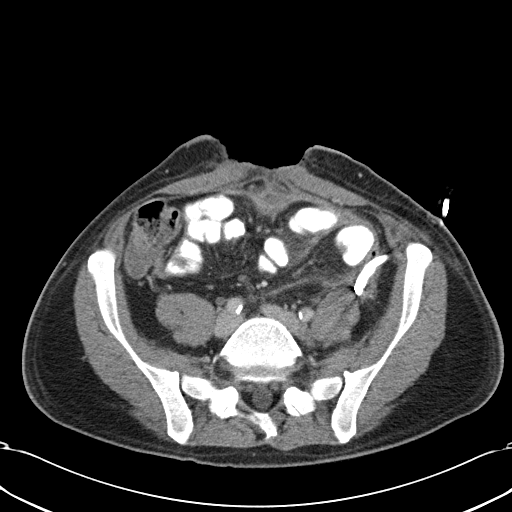
[im 41/102  soft-tissue]
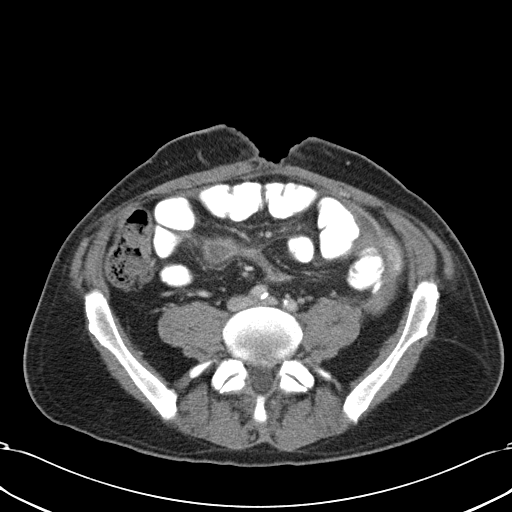
[im 46/102  soft-tissue]
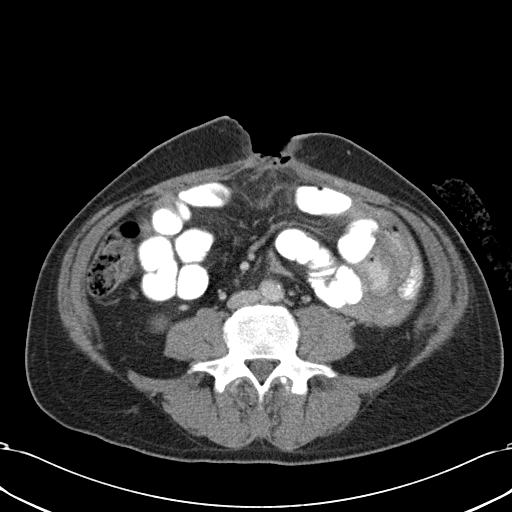
[im 56/102  soft-tissue]
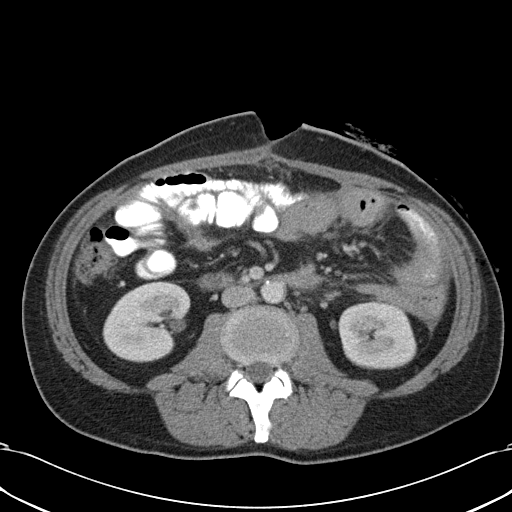
[im 61/102  soft-tissue]
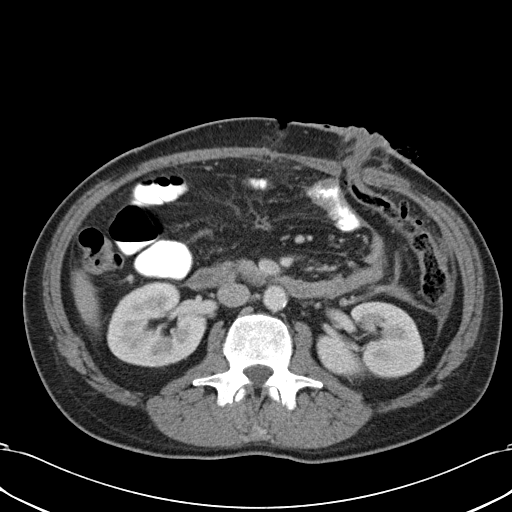
[im 61/102  bone]
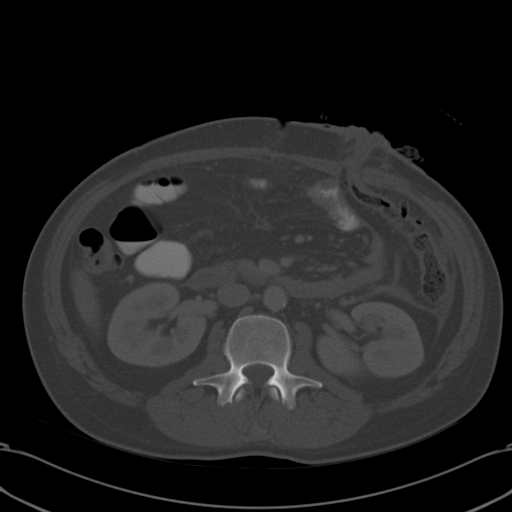
[im 66/102  soft-tissue]
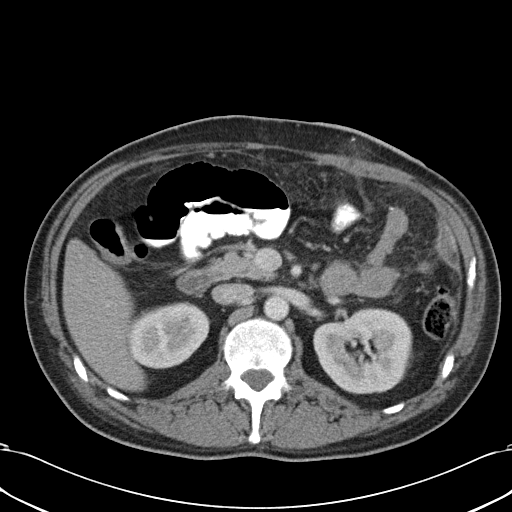
[im 76/102  soft-tissue]
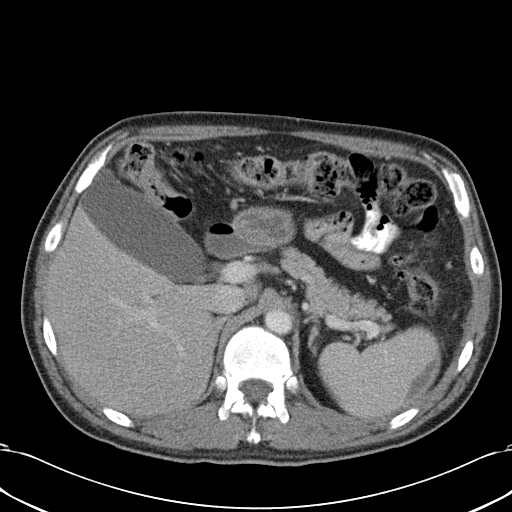
[im 81/102  soft-tissue]
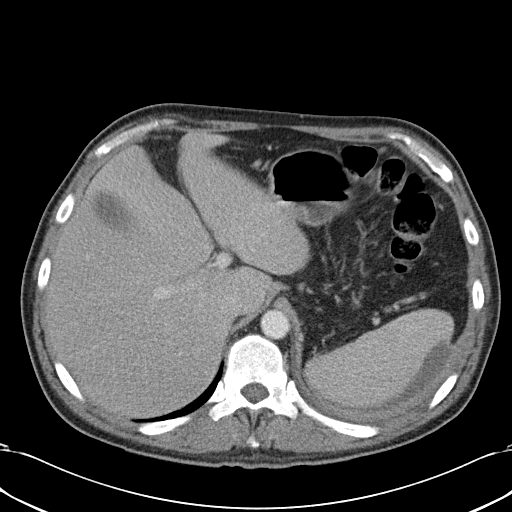
[im 86/102  soft-tissue]
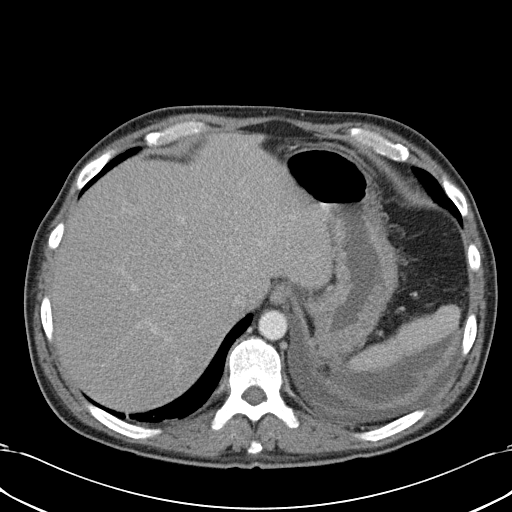
[im 96/102  soft-tissue]
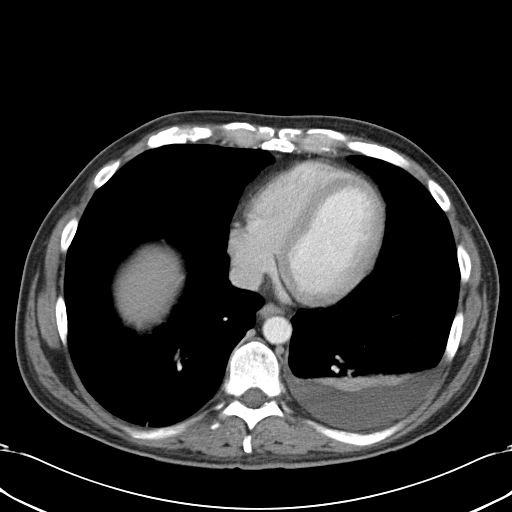

[Series 5: coronals · coronal · 0.99mm/px · 3 of 117 slices shown]
[im 39/117  soft-tissue]
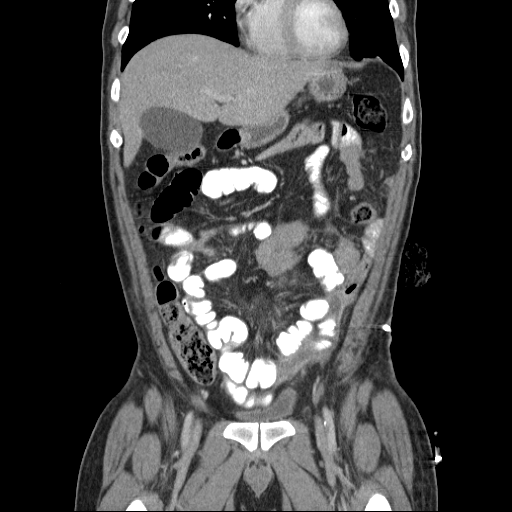
[im 52/117  soft-tissue]
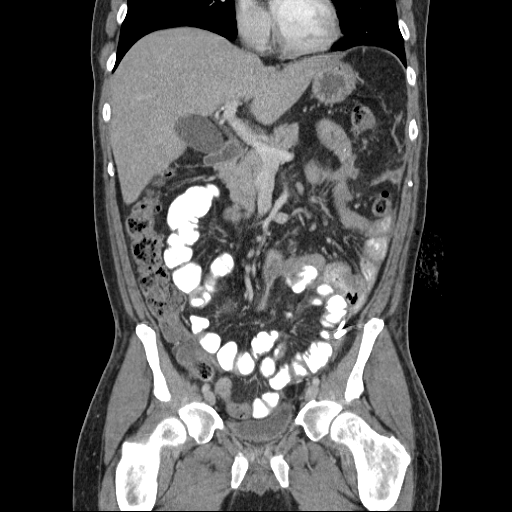
[im 65/117  soft-tissue]
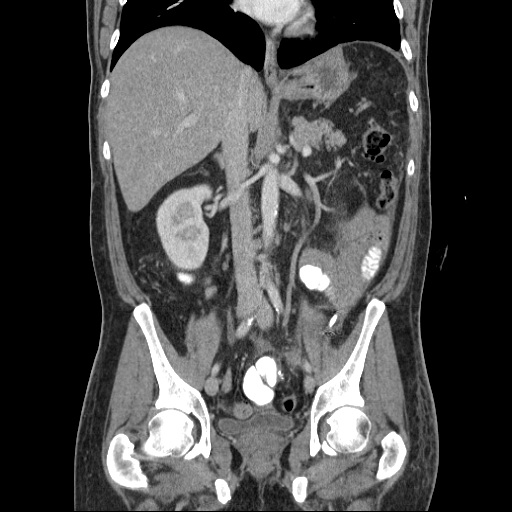

[17 of 46 positions shown; findings below may reference images not displayed]

FINDINGS: Post left-sided/sigmoid colectomy with colostomy left
upper quadrant.

Drain has been placed in the left lower quadrant/pelvic collection.
Interval significant improvement with decrease in size although
incomplete resolution of the abscess/fluid collection within the
left pelvis. Residual abscess/fluid is prominently located anterior
and medial to the surgical drain with maximal transverse dimension
of 4.4 x 1.4 cm and 3.3 x 2.3 cm.

Residual left-sided pleural effusion and basilar atelectasis.

Persistent posterior-superior perisplenic loculated collection
measures 10.8 x 6.9 x 2.3 cm.  Infection not excluded.  Relatively
similar to the prior exam.

Superior lateral to the splenic flexure, 3 x 2.4 x 1.8 cm
collection consistent with small abscess without significant
change.

Fatty infiltration of the liver without focal liver mass.  Dilated
gallbladder without calcified gallstones.  No pancreatic, adrenal
or renal mass.

Large anterior abdominal incision healing by secondary intent.

Atherosclerotic type changes of the aorta and iliac arteries
advance for patient's age.

Portions of small bowel prominent in size without point of
obstruction identified.  No free intraperitoneal air.

Bilateral L5 pars defects.
IMPRESSION: Interval improvement although incomplete clearance of postoperative
abscesses as noted above.

## 2013-04-13 ENCOUNTER — Encounter (INDEPENDENT_AMBULATORY_CARE_PROVIDER_SITE_OTHER): Payer: Self-pay | Admitting: General Surgery

## 2013-04-13 ENCOUNTER — Ambulatory Visit (INDEPENDENT_AMBULATORY_CARE_PROVIDER_SITE_OTHER): Payer: Medicaid Other | Admitting: General Surgery

## 2013-04-13 VITALS — BP 150/80 | HR 96 | Temp 98.6°F | Resp 14 | Ht 70.0 in | Wt 176.2 lb

## 2013-04-13 DIAGNOSIS — K439 Ventral hernia without obstruction or gangrene: Secondary | ICD-10-CM

## 2013-04-13 NOTE — Progress Notes (Signed)
Patient ID: Timothy [name], male   DOB: August 28, 1965, 47 y.o.   MRN: [id number]  Chief Complaint  Patient presents with  . Routine Post Op    3 month abd recheck    HPI Timothy [name] is a 47 y.o. male.  The patient is a 47 year old white male who is 9 months status post colostomy reversal. His postoperative course was complicated by a superficial wound infection and fascial dehiscence. Over the last 3 months his hernia has gotten significantly larger. It does cause him a lot of discomfort. He is ready to have the hernia fixed.  HPI  Past Medical History  Diagnosis Date  . Hypoglycemia   . GERD (gastroesophageal reflux disease)   . Diverticulosis     By colonoscopy June 2005  . Lower GI bleed     June 2005. Presumably secondary to diverticulosis.  . Anemia   . Anxiety   . Alcohol abuse   . Hemorrhoids   . Family history of anesthesia complication     Mother N/V  . Bronchitis     history  . Cellulitis     - left knee-2009  . Glaucoma syndrome     does not use eye drops, "They burn".  . Sleep apnea     witness apnea by wife.  . Elevated CK 3/13  . Depression   . Incisional hernia   . Prinzmetal angina   . History of stomach ulcers 2005  . Headache(784.0)     "alot; not daily" (07/23/2012)  . Migraines     "not often" (07/23/2012)  . Condyloma 02/04/2012  . Acute bronchitis 02/15/2013    Past Surgical History  Procedure Laterality Date  . Appendectomy  1982  . Elbow fracture surgery  ~ 1975    left;  pins inserted   . Wisdom tooth extraction  ~ 1983  . Laparoscopic left colon resection  02/19/2012    SIGMOID  . Wart fulguration  02/19/2012    Procedure: FULGURATION ANAL WART;  Surgeon: Robyne Askew, MD;  Location: [facility] OR;  Service: General;  Laterality: N/A;  Destroy  Anal Condyloma   . Laparotomy  02/27/2012    Procedure: EXPLORATORY LAPAROTOMY;  Surgeon: Robyne Askew, MD;  Location: [facility] OR;  Service: General;  Laterality: N/A;  . Colostomy  02/27/2012    Procedure:  COLOSTOMY;  Surgeon: Robyne Askew, MD;  Location: [facility] OR;  Service: General;  Laterality: N/A;  . Colon resection  02/27/2012    Procedure: COLON RESECTION;  Surgeon: Robyne Askew, MD;  Location: [facility] OR;  Service: General;  Laterality: N/A;  . Colostomy takedown  07/23/2012  . Abdominal exploration surgery  07/23/2012    w/LOA (07/23/2012)  . Colostomy takedown  07/23/2012    Procedure: COLOSTOMY TAKEDOWN;  Surgeon: Robyne Askew, MD;  Location: [facility] OR;  Service: General;  Laterality: N/A;  Primary Anastomosis  . Lesion destruction  07/23/2012    Procedure: DESTRUCTION LESION ANUS;  Surgeon: Robyne Askew, MD;  Location: [facility] OR;  Service: General;  Laterality: N/A;  DESTRUCTION ANAL CONDYLOMA  . Lysis of adhesion  07/23/2012    Procedure: LYSIS OF ADHESION;  Surgeon: Robyne Askew, MD;  Location: [facility] OR;  Service: General;  Laterality: N/A;  . Laparotomy  07/23/2012    Procedure: EXPLORATORY LAPAROTOMY;  Surgeon: Robyne Askew, MD;  Location: [facility] OR;  Service: General;  Laterality: N/A;    Family History  Problem  Relation Age of Onset  . Diabetes Mother   . Parkinsonism Mother   . Depression Mother   . Alcohol abuse Father   . Hypertension Father   . Breast cancer Maternal Aunt   . Stroke Neg Hx   . Heart disease Neg Hx   . Anxiety disorder Father   . Ulcers Father   . Colon polyps Father   . Diabetes Mother   . Diabetes Maternal Grandmother     Social History History  Substance Use Topics  . Smoking status: Current Every Day Smoker -- 2.00 packs/day for 27 years    Types: Cigarettes  . Smokeless tobacco: Former User    Types: Chew     Comment: 07/23/2012 "raely uses dip"  . Alcohol Use: No     Comment: h/o 24 PK/ WEEKEND; 07/23/2012 "sober since 02/12/2013"    Allergies  Allergen Reactions  . Ambien [Zolpidem Tartrate] Other (See Comments)    Caused agitation of mood.   Leodis Liverpool [Propoxyphene-Acetaminophen] Hives and Itching  . Morphine And Related Itching  . Bactroban  [Mupirocin Calcium] Swelling    Caused infection at a surgical site    Current Outpatient Prescriptions  Medication Sig Dispense Refill  . ARIPiprazole (ABILIFY) 5 MG tablet Take 7.5 mg by mouth daily. Take one and half of a 5 mg pill daily.  Per Dr. Kathrynn Running in [facility].  #45 with 3 refills.      . busPIRone (BUSPAR) 15 MG tablet Take 1 tablet (15 mg total) by mouth 4 (four) times daily.  120 tablet  1  . diazepam (VALIUM) 5 MG tablet Take 1 tablet (5 mg total) by mouth every 8 (eight) hours as needed for anxiety.  60 tablet  1  . HYDROcodone-acetaminophen (NORCO) 10-325 MG per tablet Take 1 tablet by mouth every 6 (six) hours as needed for pain.  90 tablet  0  . lisinopril (PRINIVIL,ZESTRIL) 10 MG tablet Take 2 tablets (20 mg total) by mouth daily.  30 tablet  5  . ondansetron (ZOFRAN ODT) 4 MG disintegrating tablet Take 1 tablet (4 mg total) by mouth every 8 (eight) hours as needed for nausea.  30 tablet  3  . polyethylene glycol (MIRALAX / GLYCOLAX) packet Take 17 g by mouth daily.      Marland Kitchen albuterol (PROVENTIL HFA;VENTOLIN HFA) 108 (90 BASE) MCG/ACT inhaler Inhale 2 puffs into the lungs every 4 (four) hours as needed for wheezing.  1 Inhaler  0   No current facility-administered medications for this visit.    Review of Systems Review of Systems  Constitutional: Negative.   HENT: Negative.   Eyes: Negative.   Respiratory: Positive for cough.   Cardiovascular: Negative.   Gastrointestinal: Positive for abdominal pain and abdominal distention.  Endocrine: Negative.   Genitourinary: Negative.   Musculoskeletal: Negative.   Skin: Negative.   Allergic/Immunologic: Negative.   Neurological: Negative.   Hematological: Negative.   Psychiatric/Behavioral: Negative.     Blood pressure 150/80, pulse 96, temperature 98.6 F (37 C), temperature source Temporal, resp. rate 14, height 5\' 10"  (1.778 m), weight 176 lb 3.2 oz (79.924 kg).  Physical Exam Physical Exam  Constitutional: He is  oriented to person, place, and time. He appears well-developed and well-nourished.  HENT:  Head: Normocephalic and atraumatic.  Eyes: Conjunctivae and EOM are normal. Pupils are equal, round, and reactive to light.  Neck: Normal range of motion. Neck supple.  Cardiovascular: Normal rate, regular rhythm and normal heart sounds.   Pulmonary/Chest: Effort  normal and breath sounds normal.  Abdominal: Soft. Bowel sounds are normal.  There is an obvious ventral hernia with fascial separation measuring about 10-11 cm. It reduces easily. There is no sign of infection or obstruction  Musculoskeletal: Normal range of motion.  Neurological: He is alert and oriented to person, place, and time.  Skin: Skin is warm and dry.  Psychiatric: He has a normal mood and affect. His behavior is normal.    Data Reviewed As above  Assessment    The patient has a large symptomatic ventral hernia. Because of the risk of it getting larger as well as the risk of incarceration and strangulation I think he would benefit from having this fixed. I have discussed with him in detail the risks and benefits of the operation to fix the hernia as well as some of the technical aspects including the use of mesh and the risk of infection and he understands and wishes to proceed     Plan    Plan for ventral hernia repair with mesh        TOTH III,Kit Mollett S 04/13/2013, 10:11 AM

## 2013-04-13 NOTE — Patient Instructions (Signed)
Plan for ventral hernia repair with mesh

## 2013-04-14 ENCOUNTER — Encounter: Payer: Self-pay | Admitting: Family Medicine

## 2013-04-14 ENCOUNTER — Ambulatory Visit (INDEPENDENT_AMBULATORY_CARE_PROVIDER_SITE_OTHER): Payer: Medicaid Other | Admitting: Family Medicine

## 2013-04-14 ENCOUNTER — Telehealth: Payer: Self-pay | Admitting: Psychology

## 2013-04-14 ENCOUNTER — Telehealth: Payer: Self-pay | Admitting: Family Medicine

## 2013-04-14 VITALS — BP 150/86 | HR 84 | Ht 70.0 in | Wt 176.0 lb

## 2013-04-14 DIAGNOSIS — R112 Nausea with vomiting, unspecified: Secondary | ICD-10-CM

## 2013-04-14 DIAGNOSIS — G8929 Other chronic pain: Secondary | ICD-10-CM

## 2013-04-14 DIAGNOSIS — R1013 Epigastric pain: Secondary | ICD-10-CM

## 2013-04-14 DIAGNOSIS — F410 Panic disorder [episodic paroxysmal anxiety] without agoraphobia: Secondary | ICD-10-CM

## 2013-04-14 DIAGNOSIS — R1011 Right upper quadrant pain: Secondary | ICD-10-CM

## 2013-04-14 MED ORDER — HYDROCODONE-ACETAMINOPHEN 10-325 MG PO TABS: 1.0000 | ORAL_TABLET | Freq: Three times a day (TID) | ORAL | Status: DC | PRN

## 2013-04-14 MED ORDER — DIAZEPAM 5 MG PO TABS: 5.0000 mg | ORAL_TABLET | Freq: Two times a day (BID) | ORAL | Status: DC | PRN

## 2013-04-14 NOTE — Telephone Encounter (Signed)
Pt went to pharmacy and was told too early for Valium refill. Wants [place] to OK early refill. Will forward to Dr [name]

## 2013-04-14 NOTE — Telephone Encounter (Signed)
Pharmacy called and notified that patient could pick up 10 additional tablet of diazepam to prevent going through withdrawal.

## 2013-04-14 NOTE — Progress Notes (Signed)
  Subjective:    Patient ID: Timothy [name], male    DOB: May 05, 1966, 47 y.o.   MRN: [id number]  HPI  47 year old M with bipolar disorder, tobacco abuse, hypertension and chronic abdominal pain s/p bowel resection for recurrent diverticulitis in July 2013 and reanastomosis 07/23/12.   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 This increased the patient's anxiety from baseline. He believes that he needs more diazepam to control his symptoms. He thinks his symptoms are improving he had 3 diazepam to take per day instead of 2. He has been taking 3 tablets a day for the past few weeks while trying to deal with anxiety of his father's diagnosis. The patient does recognize that taking BuSpar 4 times a day has improved his anxiety as well. He is still taking Abilify 5 mg daily. Patient has decreased his smoking of cigarettes to one pack per day and would like to quit by the time he has surgery. He is using electronic cigarettes help curb his cravings. He denies any alcohol use.  Nausea and vomiting:  Patient was having persistent nausea and vomiting nightly concerning for gastroparesis versus obstruction; therefore a referral is made to a gastroenterologist and endoscopy was scheduled; this has not taken place yet given the patient has been out of town in [place];  Patient denies any recent nausea and vomiting, with only occurring twice in the last 6 weeks and believes this is related to any specific foods such as pizza lasagna; having 3-4 bowel movements daily; patient questioning whether he needs to have an appointment with a gastroenterologist a more   Review of Systems     Objective:   Physical Exam BP 150/86  Pulse 84  Ht 5\' 10"  (1.778 m)  Wt 176 lb (79.833 kg)  BMI 25.25 kg/m2  Gen: middle aged, WM, non ill appearing  Oropharynx: clear, mucous membranes moist Abd: very protuberant, with large ventral hernia, mild tenderness to palpation Psych: normal affect, thought content and speech pattern; much less anxious and agitated than previous visits Skin: xerosis of palms bilaterally     Assessment & Plan:

## 2013-04-14 NOTE — Telephone Encounter (Signed)
Timothy [name] called to check-in regarding appointments.  He would like an earlier one if I get a cancellation.  Scheduled to see him on 10/3 at 8:30.  He said his surgery is scheduled for 10/30.  His dad might come up for it.  Timothy [name] says he is feeling a little stuck because he is worried about his dad and thinking he should be there and also worried about his own health.  Discussed briefly.  His dad's health crisis Timothy [name] said his dad's prognosis is 2 to 6 months) is coming at a really challenging time.  I will call him if I get a cancellation sooner.

## 2013-04-14 NOTE — Patient Instructions (Addendum)
Dear Timothy [name],   It was great to see you today. You look good all things considered. Keep up your strength for your family and your step mother.   Please use the pain medications as needed. Remember to take the stool softener daily.   For anxiety, do your best to limit the diazepam. This will help in the long run. You can use then 10 pills to prevent withdrawal.   For your endoscopy, you can cancel it since you are not having symptoms.   Sincerely,   Dr. [name]

## 2013-04-15 DIAGNOSIS — G8929 Other chronic pain: Secondary | ICD-10-CM | POA: Insufficient documentation

## 2013-04-15 NOTE — Assessment & Plan Note (Signed)
Assessment: nausea and vomiting markedly improved without evidence of obstruction Plan: I encouraged patient to cancel gastro-neurology appointment given his improvement of symptoms that are likely related to ventral hernia especially given he will have a large intra-abdominal procedure in approximately one month, he is agreeable to this

## 2013-04-15 NOTE — Assessment & Plan Note (Signed)
Assessment: patient with persistent abdominal pain likely due to very large ventral hernia Plan: continue hydrocodone/acetaminophen 10/325 milligrams every 8 hours when necessary #90 with one refill to last for duration of 2 months; attempt to wean patient after surgery

## 2013-04-15 NOTE — Assessment & Plan Note (Signed)
Assessment: patient more well-appearing than previous visits, but claims to need more benzodiazepines. I made it clear that we will not increase the amount of benzodiazepines that he is taking per day. I also decided that it is in his best interest to take less than he is currently taking the medication will be more effective in the future. He understands the risk and benefits. He does state he was concerned he will run out of medication approximately 10 days earlier and his next prescription is available.  Plan:  - Continue BuSpar 15 mg 4 times a day - Patient given prescription for diazepam 5 mg #10 with 0 refills to be taken as needed for symptoms of withdrawal as patient has had withdrawal from medications in the past - Patient will follow up with clinical psychologist Timothy [name]

## 2013-04-16 ENCOUNTER — Telehealth: Payer: Self-pay | Admitting: Family Medicine

## 2013-04-16 NOTE — Telephone Encounter (Signed)
Will fwd. To Dr.Williamson for RX. Thank you. Lorenda Hatchet, Renato Battles

## 2013-04-16 NOTE — Telephone Encounter (Signed)
Please ask patient to bring that up at mood disorder clinic, b/c it may impact his mood, and I would like Dr. Broadus John input.

## 2013-04-16 NOTE — Telephone Encounter (Signed)
Pt would like Dr. [name] to put him on chantix. Timothy [name]

## 2013-04-16 NOTE — Telephone Encounter (Signed)
Pt notified and will check with Dr. Kathrynn Running and report back to [place]. Lugene Hitt, Darlyne Russian, CMA

## 2013-04-19 ENCOUNTER — Ambulatory Visit: Payer: Medicaid Other | Admitting: Gastroenterology

## 2013-04-20 ENCOUNTER — Telehealth: Payer: Self-pay | Admitting: Family Medicine

## 2013-04-20 NOTE — Telephone Encounter (Signed)
Called pt. He reports that he has been in pain for 4 days, unable to sleep through the night because of his pain. He reports regular bowel movements, passes gas. Norco 10/325 is not working.  His pain is 5/10. I told the pt that I would send his message to Dr.Williamson. We then got disconnected. Waiting for call back and also fwd. Message to Dr.Williamson. Lorenda Hatchet, Renato Battles

## 2013-04-20 NOTE — Telephone Encounter (Addendum)
Timothy [name] called to say that the pain med he was prescribed for his abd pain is not helping.  Not able to sleep because of the pain.  Patient called back to ask about pain medication

## 2013-04-21 ENCOUNTER — Ambulatory Visit (INDEPENDENT_AMBULATORY_CARE_PROVIDER_SITE_OTHER): Payer: Medicaid Other | Admitting: Family Medicine

## 2013-04-21 ENCOUNTER — Encounter: Payer: Self-pay | Admitting: Family Medicine

## 2013-04-21 ENCOUNTER — Telehealth: Payer: Self-pay | Admitting: Psychology

## 2013-04-21 VITALS — BP 177/101 | HR 90 | Temp 97.8°F | Wt 175.0 lb

## 2013-04-21 DIAGNOSIS — G8929 Other chronic pain: Secondary | ICD-10-CM

## 2013-04-21 DIAGNOSIS — R1011 Right upper quadrant pain: Secondary | ICD-10-CM

## 2013-04-21 NOTE — Telephone Encounter (Signed)
Patient should be seen for an appointment. He already has a same day scheduled, so it can be addressed by the that physician.

## 2013-04-21 NOTE — Progress Notes (Signed)
  Subjective:    Patient ID: Timothy [name], male    DOB: 10-09-1965, 47 y.o.   MRN: [id number]  HPI  Abdominal Pain Having worsening pain in his hernia intermittenlty that he needs to take more norco.  Pain is the same as he has had previously.  Normal bms only occasional vomiting - last 3 days ago.  No fever or chills or shortness of breath or rash.   Is not wearing abdomen binder.  His hernia reduces easily when he lies down.  No dysuria or back pain  PMH Scheduled for surgery end of this month.  Sees Dr [name] who gives him narco - see last note for details  Review of Systems     Objective:   Physical Exam  Alert no acute distress conversant Very Large ventral hernia that reduces to flat when he lies down Mildly diffusely tender  No guarding or rebound or masses on abdomen exam No CVAT. Abdomen skin - well healed stretched out scar. No fluctuance or focal redness or focal tenderness      Assessment & Plan:

## 2013-04-21 NOTE — Assessment & Plan Note (Signed)
Worsened. His current symptoms seem the same as his chronic abdomen pain.  No sxs of incarceration or blockage or infection or other abdomen pathology.   After explained would not give more norco he did not ask for more.  Advised to wear binder and precautions given.

## 2013-04-21 NOTE — Telephone Encounter (Signed)
Timothy [name] called to discuss the possibility of starting Chantix.  Discussed with Dr. Kathrynn Running.  Dr. Kathrynn Running stated an increased risk of psychiatric side effects of this medicine is a relative contraindication however a trial with close monitoring and good education seems reasonable.  If Timothy [name] notices any downward shift in mood, increased irritability or SI / HI ideation, he should stop the medicine immediately and call me or his PCP.     Additionally, other supportive measures can be considered including nicotine replacement and the QuitNow support.  Not sure if Timothy [name] is connected here but he seems to really benefit from the support from others so would encourage this phone contact as he does not use the computer.

## 2013-04-21 NOTE — Patient Instructions (Addendum)
I do not think you have an infectinon or incarceration (stuck hernia)  Wear the binder whenever you are sitting up or walking or when the hernia is sticking out  Call us if you have persistent vomiting or fever or no bowel movement or the hernia does not go back in when you lie down  You can take up to 4 regular tylenol tabs a day between the norco doses

## 2013-04-23 ENCOUNTER — Encounter: Payer: Self-pay | Admitting: Psychology

## 2013-04-23 ENCOUNTER — Ambulatory Visit (INDEPENDENT_AMBULATORY_CARE_PROVIDER_SITE_OTHER): Payer: Medicaid Other | Admitting: Psychology

## 2013-04-23 ENCOUNTER — Other Ambulatory Visit: Payer: Self-pay | Admitting: Family Medicine

## 2013-04-23 ENCOUNTER — Telehealth: Payer: Self-pay | Admitting: *Deleted

## 2013-04-23 DIAGNOSIS — Z720 Tobacco use: Secondary | ICD-10-CM

## 2013-04-23 DIAGNOSIS — F17200 Nicotine dependence, unspecified, uncomplicated: Secondary | ICD-10-CM

## 2013-04-23 DIAGNOSIS — F319 Bipolar disorder, unspecified: Secondary | ICD-10-CM

## 2013-04-23 DIAGNOSIS — Z716 Tobacco abuse counseling: Secondary | ICD-10-CM

## 2013-04-23 DIAGNOSIS — Z7189 Other specified counseling: Secondary | ICD-10-CM

## 2013-04-23 MED ORDER — VARENICLINE TARTRATE 1 MG PO TABS: 1.0000 mg | ORAL_TABLET | Freq: Two times a day (BID) | ORAL | Status: DC

## 2013-04-23 MED ORDER — VARENICLINE TARTRATE 0.5 MG PO TABS: ORAL_TABLET | ORAL | Status: DC

## 2013-04-23 NOTE — Patient Instructions (Addendum)
Please schedule a follow-up for: October 13th at 9:00. I sent a page to Dr. [name] - hopefully he will get the prescription for Chantix sent over soon. We discussed other ways to relax - a guided imagery, visualization or relaxation training might be helpful.  There is no harm associated with it so it might be worth a shot.  Youtube has several possibilities.  Let me know how this goes.

## 2013-04-23 NOTE — Assessment & Plan Note (Signed)
Report of mood is euthymic with mild anxiety.  Affect is within normal limits today.  Thoughts are clear and goal directed.  He looks well.    Smoking cigarettes is a major coping mechanism and with the surgery and his dad's terminal illness, it should prove quite a challenge to quit.  He did quit once before for three months.  If [name] quits with him this should help and we discussed this.  I forgot to give him quitline info and will call with that.    Discussed working on additional coping mechanisms to help take the place of the smoking.  See patient instructions for further plan.

## 2013-04-23 NOTE — Assessment & Plan Note (Signed)
Wanting to quit.  Desiring a trial of Chantix.  Dr. Kathrynn Running thought a closely monitored trial was reasonable coupled with significant support via [place] Quitline.

## 2013-04-23 NOTE — Progress Notes (Signed)
Timothy [name] presented for follow-up.  It is the first time I have seen him since he saw his dad in [place].  He reports that he is doing okay.  He is gearing up for a 10/30 surgery with Dr. Carolynne Edouard.  He trusts Dr. Carolynne Edouard and remains nervous about the outcome - specifically another infection.  He is working in quitting smoking in hopes of aiding his healing.  He reports he is down to 1.5 packs using an electronic cigarette.  He is not interested in nicotine replacement.  He did not know about the [place] Quitline.   Discussed family situation and dad's health at length.

## 2013-04-23 NOTE — Progress Notes (Signed)
Called pt and informed of Dr.Williamson's message. Re: Chantix. Lorenda Hatchet, Renato Battles

## 2013-04-23 NOTE — Telephone Encounter (Signed)
Error. .Timothy [name]  

## 2013-05-03 ENCOUNTER — Encounter: Payer: Self-pay | Admitting: Psychology

## 2013-05-03 ENCOUNTER — Ambulatory Visit (INDEPENDENT_AMBULATORY_CARE_PROVIDER_SITE_OTHER): Payer: Medicaid Other | Admitting: Family Medicine

## 2013-05-03 ENCOUNTER — Telehealth (INDEPENDENT_AMBULATORY_CARE_PROVIDER_SITE_OTHER): Payer: Self-pay | Admitting: *Deleted

## 2013-05-03 ENCOUNTER — Ambulatory Visit (INDEPENDENT_AMBULATORY_CARE_PROVIDER_SITE_OTHER): Payer: Medicaid Other | Admitting: Psychology

## 2013-05-03 ENCOUNTER — Encounter: Payer: Self-pay | Admitting: Family Medicine

## 2013-05-03 VITALS — BP 144/95 | HR 70 | Ht 70.0 in | Wt 178.0 lb

## 2013-05-03 DIAGNOSIS — Z7189 Other specified counseling: Secondary | ICD-10-CM

## 2013-05-03 DIAGNOSIS — F4323 Adjustment disorder with mixed anxiety and depressed mood: Secondary | ICD-10-CM

## 2013-05-03 DIAGNOSIS — F17200 Nicotine dependence, unspecified, uncomplicated: Secondary | ICD-10-CM

## 2013-05-03 DIAGNOSIS — F319 Bipolar disorder, unspecified: Secondary | ICD-10-CM

## 2013-05-03 DIAGNOSIS — I10 Essential (primary) hypertension: Secondary | ICD-10-CM

## 2013-05-03 DIAGNOSIS — F410 Panic disorder [episodic paroxysmal anxiety] without agoraphobia: Secondary | ICD-10-CM

## 2013-05-03 DIAGNOSIS — Z716 Tobacco abuse counseling: Secondary | ICD-10-CM

## 2013-05-03 MED ORDER — BUSPIRONE HCL 15 MG PO TABS: 15.0000 mg | ORAL_TABLET | Freq: Four times a day (QID) | ORAL | Status: DC

## 2013-05-03 MED ORDER — LISINOPRIL 10 MG PO TABS: 20.0000 mg | ORAL_TABLET | Freq: Every day | ORAL | Status: DC

## 2013-05-03 NOTE — Assessment & Plan Note (Signed)
Patient well controlled on current regimen. Refill Buspar.

## 2013-05-03 NOTE — Assessment & Plan Note (Signed)
Decreased use of cigarettes since initiation of Chantix.  His goal is to quit smoking but does not see that happening prior to his hospitalization for his surgery.  Thinks that the not being able to smoke in the hospital will be easier, the more he is able to cut down ahead of time.  Discussed how he might best "stay quit" post hospitalization.  He has not thought much about this.  His father's health issues serve as a motivator and he states that his father wants him to quit smoking (although his dad continues to smoke).  He would likely be more successful if he developed some thoughts or plans to help stay quit.    See patient instructions for further plan.

## 2013-05-03 NOTE — Progress Notes (Signed)
Timothy [name] presents for follow-up.  He reports he is doing well.  He thinks the Chantix has made cigarettes taste "nasty" and he has decreased to about 1ppd or slightly more without much effort.  Discussed at length.  Relationships in his house continue to be challenging.  He does not see his wife or step-daughter very much which has been helpful.  He gets frustrated by the amount of work he does in the home without any help and also acknowledges that [name] works outside the home.    His Dad is relatively stable.  Not sure if he is coming for Steve's surgery.  Timothy [name] is feeling a bit more at peace with regards to the surgery - especially after talking to a lady at church who recently had her 5th stomach surgery.

## 2013-05-03 NOTE — Assessment & Plan Note (Addendum)
Mood is reported as euthymic.  Affect is consistent.  Not complaining of anxiety - in fact notes a greater sense of peace.  Chantix does not appear to be worsening mood in any way.  Seems to be managing irritability in the home largely by avoiding contact.    Function appears high - prior to his 9:00 appointment today he got two kids off to school, unloaded the dishwasher and started a load of laundry.  It sounds like he spends a great deal of time on general household chores and appreciates the productivity.    MDC appointment on Wednesday.

## 2013-05-03 NOTE — Assessment & Plan Note (Signed)
Continue with Chantix, encouraged to quit completely before surgery.

## 2013-05-03 NOTE — Progress Notes (Signed)
  Subjective:    Patient ID: Timothy [name], male    DOB: [birth year], 47 y.o.   MRN: [id number]  HPI  47 year old M with bipolar disorder and smoking who presents for followup on smoking cessation.   Smoking Cessation - Patient requested starting Chantix and was given a prescription. He denies any change in mood or behavior and not thoughts of harming himself. He has reduced his smoking from 2ppd to 1ppd and notes that the cigarettes now taste different and unappealing. He is excited about the progress and confident that he can completely quit.   Anxiety - Patient using Buspar QID and diazepam PRN and note anxiety regarding his upcoming procedure has improved and has a more positive outloos  Social - Father undergoing palliative radiation for advanced lung cancer  Review of Systems Denies nausea or vomiting, abdominal pain minimal     Objective:   Physical Exam BP 144/95  Pulse 70  Ht 5\' 10"  (1.778 m)  Wt 178 lb (80.74 kg)  BMI 25.54 kg/m2  Gen: middle aged, WM, pleasant and conversant  Oropharynx: clear, mucous membranes moist  Abd: very protuberant, with large ventral hernia, mild tenderness to palpation  Psych: normal affect, thought content and speech pattern; much less anxious and agitated than previous visits        Assessment & Plan:

## 2013-05-03 NOTE — Telephone Encounter (Signed)
Spoke to Dr. Carolynne Edouard who states that a bowel prep is really not necessary for this patient.  Spoke to patient who states "ok.  I trust his judgement".

## 2013-05-03 NOTE — Telephone Encounter (Signed)
Patient called into the office today asking about a bowel prep.  Patient states that Dr. Carolynne Edouard told him he doesn't have to do a bowel prep for this ventral hernia repair however if he wants to do one that is fine.  Patient states he would feel better doing a bowel prep.  Explained that a message will be sent to Dr. Carolynne Edouard to find out what the most appropriate prep would be then we will give him a call this afternoon.  Patient states understanding and agreeable at this time.

## 2013-05-03 NOTE — Patient Instructions (Signed)
It was great to see you today. I am very proud of you for cutting down on the smoking. That is awesome! Keep it going.   Let me know if you need anything before surgery.   I'll see you in the hospital if not before.   Take care,   Dr. [name]

## 2013-05-03 NOTE — Patient Instructions (Signed)
Please schedule a follow-up for: October 23 at 10:00.   We discussed [place] Quitline and visualization / relaxation training to prep for your surgery.  There are no real downsides to giving it a try and if you don't find it helpful, you don't have to do it.   Setting a quit date and being intentional about it might be helpful.  Sounds like if not before, you plan to quit once you get to the hospital.  Thinking about how you plan to stay quit post-hospital is wise.   See you Wednesday for MDC.

## 2013-05-05 ENCOUNTER — Ambulatory Visit (INDEPENDENT_AMBULATORY_CARE_PROVIDER_SITE_OTHER): Payer: Medicaid Other | Admitting: Psychology

## 2013-05-05 DIAGNOSIS — F319 Bipolar disorder, unspecified: Secondary | ICD-10-CM

## 2013-05-05 NOTE — Patient Instructions (Signed)
Please schedule a follow-up for November 26th at 9:00. Continue medications.  I am glad you feel better and hope it continues. I will continue to see you for therapy.

## 2013-05-05 NOTE — Assessment & Plan Note (Signed)
Report of mood is good overall with pockets of irritability that he feels he can manage adequately.  Function remains good.  Discussed day time napping as being not very healthy.  Not a great deal of motivation to change this behavior.  Timothy [name] seems to benefit greatly from the support both in [facility] and behavioral medicine as well as Dr. [name].  He is feeling positively about his upcoming surgery which will hopefully aid his recovery.  He requested to schedule another [facility] appointment after his survery.  See patient instructions for further plan.

## 2013-05-05 NOTE — Progress Notes (Signed)
Timothy [name] presents for follow-up.  He doesn't really have anything for the agenda.  He is doing well on the Chantix - cut back to 1 ppd.  He reports some continued irritability related to challenging relationship dynamics.  He thinks he is managing this okay.  Does not desire any changes in his medication.    Discussed upcoming surgery.

## 2013-05-10 ENCOUNTER — Encounter (HOSPITAL_COMMUNITY): Payer: Self-pay | Admitting: Pharmacy Technician

## 2013-05-10 ENCOUNTER — Telehealth: Payer: Self-pay | Admitting: Family Medicine

## 2013-05-10 NOTE — Telephone Encounter (Signed)
Please let patient know that I gave him 2 prescriptions for #90 tablets each on 04/14/13 which should each last 60 days total, so according to my notes he should have enough to last through the surgery. If this is not correct, then please ask him to come in for a visit.

## 2013-05-10 NOTE — Telephone Encounter (Signed)
Refill request for Norco. Please call patient once rx has been printed.

## 2013-05-11 ENCOUNTER — Encounter (HOSPITAL_COMMUNITY): Payer: Self-pay

## 2013-05-11 ENCOUNTER — Encounter (HOSPITAL_COMMUNITY)
Admission: RE | Admit: 2013-05-11 | Discharge: 2013-05-11 | Disposition: A | Discharge status: Home / Self Care | Payer: Medicaid Other | Source: Ambulatory Visit | Attending: Anesthesiology | Admitting: Anesthesiology

## 2013-05-11 ENCOUNTER — Encounter (HOSPITAL_COMMUNITY)
Admission: RE | Admit: 2013-05-11 | Discharge: 2013-05-11 | Disposition: A | Discharge status: Home / Self Care | Payer: Medicaid Other | Source: Ambulatory Visit | Attending: General Surgery | Admitting: General Surgery

## 2013-05-11 DIAGNOSIS — Z01812 Encounter for preprocedural laboratory examination: Secondary | ICD-10-CM | POA: Insufficient documentation

## 2013-05-11 DIAGNOSIS — Z01818 Encounter for other preprocedural examination: Secondary | ICD-10-CM | POA: Insufficient documentation

## 2013-05-11 HISTORY — DX: Personal history of other specified conditions: Z87.898

## 2013-05-11 HISTORY — DX: Essential (primary) hypertension: I10

## 2013-05-11 LAB — CBC
HCT: 44.6 % (ref 39.0–52.0)
Hemoglobin: 14.5 g/dL (ref 13.0–17.0)
MCH: 29.5 pg (ref 26.0–34.0)
MCHC: 32.5 g/dL (ref 30.0–36.0)
MCV: 90.8 fL (ref 78.0–100.0)
RDW: 13.3 % (ref 11.5–15.5)

## 2013-05-11 LAB — COMPREHENSIVE METABOLIC PANEL
Albumin: 3.9 g/dL (ref 3.5–5.2)
BUN: 10 mg/dL (ref 6–23)
Calcium: 9.7 mg/dL (ref 8.4–10.5)
Creatinine, Ser: 0.77 mg/dL (ref 0.50–1.35)
GFR calc Af Amer: >90 mL/min (ref >90)
Glucose, Bld: 115 mg/dL — ABNORMAL HIGH (ref 70–99)
Total Bilirubin: 0.1 mg/dL — ABNORMAL LOW (ref 0.3–1.2)
Total Protein: 7.2 g/dL (ref 6.0–8.3)

## 2013-05-11 NOTE — Pre-Procedure Instructions (Signed)
Timothy [name]  05/11/2013   Your procedure is scheduled on:  Thursday, October 30th  Report to Main Entrance "A" and check in with admitting at 0530 AM.  Call this number if you have problems the morning of surgery: [phone]   Remember:   Do not eat food or drink liquids after midnight.   Take these medicines the morning of surgery with A SIP OF WATER: Abilify, Buspar, Valium if needed, hydrocodone if needed   Do not wear jewelry.  Do not wear lotions, powders, or perfumes. You may wear deodorant.  Do not shave 48 hours prior to surgery. Men may shave face and neck.  Do not bring valuables to the hospital.  [facility] is not responsible for any belongings or valuables.               Contacts, dentures or bridgework may not be worn into surgery.  Leave suitcase in the car. After surgery it may be brought to your room.  For patients admitted to the hospital, discharge time is determined by your  treatment team.               Patients discharged the day of surgery will not be allowed to drive home.    Special Instructions: Shower using CHG 2 nights before surgery and the night before surgery.  If you shower the day of surgery use CHG.  Use special wash - you have one bottle of CHG for all showers.  You should use approximately 1/3 of the bottle for each shower.   Please read over the following fact sheets that you were given: Pain Booklet, Coughing and Deep Breathing, MRSA Information and Surgical Site Infection Prevention

## 2013-05-11 NOTE — Telephone Encounter (Signed)
Called pt.LMVM to call back. Please see message. Thanks. .Ginny Loomer  

## 2013-05-11 NOTE — Progress Notes (Signed)
05/11/13 0856  OBSTRUCTIVE SLEEP APNEA  Have you ever been diagnosed with sleep apnea through a sleep study? No  Do you snore loudly (loud enough to be heard through closed doors)?  1  Do you often feel tired, fatigued, or sleepy during the daytime? 0  Has anyone observed you stop breathing during your sleep? 1  Do you have, or are you being treated for high blood pressure? 1  BMI more than 35 kg/m2? 0  Age over 45 years old? 0  Neck circumference greater than 40 cm/18 inches? 0  Gender: 1  Obstructive Sleep Apnea Score 4  Score 4 or greater  Results sent to PCP

## 2013-05-11 NOTE — Progress Notes (Addendum)
Primary physician -dr. [name] Does not have a cardiologist ekg in feb in epic no other prior cardiac testing  Pt is allergic to mupirocin ointment causes swelling, did not perform pcr swab due to this allergy.

## 2013-05-13 ENCOUNTER — Ambulatory Visit (INDEPENDENT_AMBULATORY_CARE_PROVIDER_SITE_OTHER): Payer: Medicaid Other | Admitting: Psychology

## 2013-05-13 ENCOUNTER — Encounter: Payer: Self-pay | Admitting: Psychology

## 2013-05-13 DIAGNOSIS — F319 Bipolar disorder, unspecified: Secondary | ICD-10-CM

## 2013-05-13 DIAGNOSIS — Z716 Tobacco abuse counseling: Secondary | ICD-10-CM

## 2013-05-13 DIAGNOSIS — Z7189 Other specified counseling: Secondary | ICD-10-CM

## 2013-05-13 DIAGNOSIS — F17200 Nicotine dependence, unspecified, uncomplicated: Secondary | ICD-10-CM

## 2013-05-13 NOTE — Assessment & Plan Note (Signed)
Feeling relatively peaceful about the surgery.  Hopeful that it will go well.  Biggest concern is an infection and a failure of the mesh but he does not think that will happen.  Father is not coming for the surgery secondary to his own health.  Hospice is going to start coming to the house.  Timothy [name] hopes to go to [facility] when Dr. Carolynne Edouard clears him post surgery.

## 2013-05-13 NOTE — Assessment & Plan Note (Signed)
Still at a pack a day.  Continues to want to quit but seems more ambivalent.  Rated importance at a 6 or 7.  Does not seem particularly motivated to put much thought or action into a plan.  He did state that he would call the  Quitline post surgery if he gets an urge to smoke.  Looked at his relationship with his son (and his hope that his son does not start to smoke) as a potential motivator.

## 2013-05-13 NOTE — Progress Notes (Signed)
Timothy [name] presents for follow-up.  Focus was on his upcoming surgery and smoking cessation.

## 2013-05-19 ENCOUNTER — Telehealth: Payer: Self-pay | Admitting: Family Medicine

## 2013-05-19 MED ORDER — CEFAZOLIN SODIUM-DEXTROSE 2-3 GM-% IV SOLR
2.0000 g | INTRAVENOUS | Status: AC
  Administered 2013-05-20: 2 g via INTRAVENOUS
  Filled 2013-05-19: qty 50

## 2013-05-19 NOTE — Telephone Encounter (Signed)
Patient called to ask for a return call. I spoke with him, and he wanted to remind me that his surgery is tomorrow. I told him that I would be there on Friday morning to check on him, which he was agreeable with.

## 2013-05-20 ENCOUNTER — Encounter (HOSPITAL_COMMUNITY)
Admission: RE | Disposition: A | Discharge status: Home / Self Care | Payer: Self-pay | Source: Ambulatory Visit | Attending: General Surgery

## 2013-05-20 ENCOUNTER — Encounter (HOSPITAL_COMMUNITY): Payer: Medicaid Other | Admitting: Anesthesiology

## 2013-05-20 ENCOUNTER — Ambulatory Visit (HOSPITAL_COMMUNITY): Payer: Medicaid Other | Admitting: Anesthesiology

## 2013-05-20 ENCOUNTER — Telehealth: Payer: Self-pay | Admitting: Psychology

## 2013-05-20 ENCOUNTER — Inpatient Hospital Stay (HOSPITAL_COMMUNITY)
Admission: RE | Admit: 2013-05-20 | Discharge: 2013-05-26 | DRG: 354 | Disposition: A | Discharge status: Home / Self Care | Payer: Medicaid Other | Source: Ambulatory Visit | Attending: General Surgery | Admitting: General Surgery

## 2013-05-20 ENCOUNTER — Encounter (HOSPITAL_COMMUNITY): Payer: Self-pay | Admitting: Anesthesiology

## 2013-05-20 DIAGNOSIS — F411 Generalized anxiety disorder: Secondary | ICD-10-CM | POA: Diagnosis present

## 2013-05-20 DIAGNOSIS — Y832 Surgical operation with anastomosis, bypass or graft as the cause of abnormal reaction of the patient, or of later complication, without mention of misadventure at the time of the procedure: Secondary | ICD-10-CM | POA: Diagnosis not present

## 2013-05-20 DIAGNOSIS — K439 Ventral hernia without obstruction or gangrene: Secondary | ICD-10-CM

## 2013-05-20 DIAGNOSIS — K929 Disease of digestive system, unspecified: Secondary | ICD-10-CM | POA: Diagnosis not present

## 2013-05-20 DIAGNOSIS — G4730 Sleep apnea, unspecified: Secondary | ICD-10-CM | POA: Diagnosis present

## 2013-05-20 DIAGNOSIS — F3289 Other specified depressive episodes: Secondary | ICD-10-CM | POA: Diagnosis present

## 2013-05-20 DIAGNOSIS — F17200 Nicotine dependence, unspecified, uncomplicated: Secondary | ICD-10-CM | POA: Diagnosis present

## 2013-05-20 DIAGNOSIS — K560 Paralytic ileus: Secondary | ICD-10-CM | POA: Diagnosis not present

## 2013-05-20 DIAGNOSIS — F329 Major depressive disorder, single episode, unspecified: Secondary | ICD-10-CM | POA: Diagnosis present

## 2013-05-20 DIAGNOSIS — Z79899 Other long term (current) drug therapy: Secondary | ICD-10-CM

## 2013-05-20 HISTORY — PX: INSERTION OF MESH: SHX5868

## 2013-05-20 HISTORY — PX: VENTRAL HERNIA REPAIR: SHX424

## 2013-05-20 LAB — CBC
HCT: 43.2 % (ref 39.0–52.0)
MCH: 30.4 pg (ref 26.0–34.0)
MCHC: 33.6 g/dL (ref 30.0–36.0)
MCV: 90.6 fL (ref 78.0–100.0)
Platelets: 364 10*3/uL (ref 150–400)
RDW: 13.3 % (ref 11.5–15.5)
WBC: 19.7 10*3/uL — ABNORMAL HIGH (ref 4.0–10.5)

## 2013-05-20 SURGERY — REPAIR, HERNIA, VENTRAL
Anesthesia: General | Site: Abdomen | Wound class: Clean

## 2013-05-20 MED ORDER — LABETALOL HCL 5 MG/ML IV SOLN
INTRAVENOUS | Status: AC
  Administered 2013-05-20: 10 mg via INTRAVENOUS
  Filled 2013-05-20: qty 4

## 2013-05-20 MED ORDER — OXYCODONE HCL 5 MG PO TABS: 5.0000 mg | ORAL_TABLET | Freq: Once | ORAL | Status: DC | PRN

## 2013-05-20 MED ORDER — ONDANSETRON HCL 4 MG/2ML IJ SOLN
4.0000 mg | Freq: Four times a day (QID) | INTRAMUSCULAR | Status: DC | PRN
  Administered 2013-05-21 (×2): 4 mg via INTRAVENOUS

## 2013-05-20 MED ORDER — HYDROMORPHONE HCL PF 1 MG/ML IJ SOLN
0.2500 mg | INTRAMUSCULAR | Status: DC | PRN
  Administered 2013-05-20 (×5): 0.5 mg via INTRAVENOUS

## 2013-05-20 MED ORDER — HYDROMORPHONE HCL PF 1 MG/ML IJ SOLN
INTRAMUSCULAR | Status: AC
  Administered 2013-05-20: 0.5 mg via INTRAVENOUS
  Filled 2013-05-20: qty 3

## 2013-05-20 MED ORDER — HYDRALAZINE HCL 20 MG/ML IJ SOLN
10.0000 mg | INTRAMUSCULAR | Status: DC | PRN
  Administered 2013-05-20: 10 mg via INTRAVENOUS
  Filled 2013-05-20: qty 1

## 2013-05-20 MED ORDER — 0.9 % SODIUM CHLORIDE (POUR BTL) OPTIME
TOPICAL | Status: DC | PRN
  Administered 2013-05-20 (×2): 1000 mL

## 2013-05-20 MED ORDER — LABETALOL HCL 5 MG/ML IV SOLN
5.0000 mg | INTRAVENOUS | Status: AC | PRN
  Administered 2013-05-20: 5 mg via INTRAVENOUS
  Administered 2013-05-20: 10 mg via INTRAVENOUS
  Administered 2013-05-20 (×3): 5 mg via INTRAVENOUS

## 2013-05-20 MED ORDER — ONDANSETRON HCL 4 MG PO TABS: 4.0000 mg | ORAL_TABLET | Freq: Four times a day (QID) | ORAL | Status: DC | PRN

## 2013-05-20 MED ORDER — KCL IN DEXTROSE-NACL 20-5-0.45 MEQ/L-%-% IV SOLN
INTRAVENOUS | Status: AC
  Administered 2013-05-20: 1000 mL
  Filled 2013-05-20: qty 1000

## 2013-05-20 MED ORDER — LABETALOL HCL 5 MG/ML IV SOLN
INTRAVENOUS | Status: AC
  Administered 2013-05-20: 5 mg via INTRAVENOUS
  Filled 2013-05-20: qty 4

## 2013-05-20 MED ORDER — LABETALOL HCL 5 MG/ML IV SOLN
5.0000 mg | INTRAVENOUS | Status: DC | PRN
  Filled 2013-05-20: qty 4

## 2013-05-20 MED ORDER — METOPROLOL TARTRATE 1 MG/ML IV SOLN
5.0000 mg | Freq: Four times a day (QID) | INTRAVENOUS | Status: DC
  Administered 2013-05-20: 5 mg via INTRAVENOUS

## 2013-05-20 MED ORDER — LIDOCAINE HCL (CARDIAC) 20 MG/ML IV SOLN
INTRAVENOUS | Status: DC | PRN
  Administered 2013-05-20: 100 mg via INTRAVENOUS

## 2013-05-20 MED ORDER — HYDROMORPHONE HCL PF 1 MG/ML IJ SOLN
INTRAMUSCULAR | Status: DC | PRN
  Administered 2013-05-20 (×2): 0.5 mg via INTRAVENOUS

## 2013-05-20 MED ORDER — OXYCODONE HCL 5 MG/5ML PO SOLN: 5.0000 mg | Freq: Once | ORAL | Status: DC | PRN

## 2013-05-20 MED ORDER — SODIUM CHLORIDE 0.9 % IR SOLN
Status: DC | PRN
  Administered 2013-05-20: 09:00:00

## 2013-05-20 MED ORDER — ESMOLOL HCL 10 MG/ML IV SOLN
INTRAVENOUS | Status: DC | PRN
  Administered 2013-05-20 (×2): 20 mg via INTRAVENOUS

## 2013-05-20 MED ORDER — PROPOFOL 10 MG/ML IV BOLUS
INTRAVENOUS | Status: DC | PRN
  Administered 2013-05-20 (×3): 30 mg via INTRAVENOUS
  Administered 2013-05-20: 200 mg via INTRAVENOUS

## 2013-05-20 MED ORDER — POVIDONE-IODINE 10 % EX OINT
TOPICAL_OINTMENT | CUTANEOUS | Status: AC
  Filled 2013-05-20: qty 28.35

## 2013-05-20 MED ORDER — HYDROMORPHONE HCL PF 1 MG/ML IJ SOLN
INTRAMUSCULAR | Status: AC
  Administered 2013-05-20: 0.5 mg via INTRAVENOUS
  Filled 2013-05-20: qty 1

## 2013-05-20 MED ORDER — ONDANSETRON HCL 4 MG/2ML IJ SOLN
INTRAMUSCULAR | Status: DC | PRN
  Administered 2013-05-20: 4 mg via INTRAVENOUS

## 2013-05-20 MED ORDER — VECURONIUM BROMIDE 10 MG IV SOLR
INTRAVENOUS | Status: DC | PRN
  Administered 2013-05-20 (×3): 2 mg via INTRAVENOUS

## 2013-05-20 MED ORDER — HYDRALAZINE HCL 20 MG/ML IJ SOLN
5.0000 mg | INTRAMUSCULAR | Status: DC | PRN
  Administered 2013-05-20: 10 mg via INTRAVENOUS
  Administered 2013-05-20 (×2): 5 mg via INTRAVENOUS

## 2013-05-20 MED ORDER — MIDAZOLAM HCL 5 MG/5ML IJ SOLN
INTRAMUSCULAR | Status: DC | PRN
  Administered 2013-05-20: 2 mg via INTRAVENOUS

## 2013-05-20 MED ORDER — GLYCOPYRROLATE 0.2 MG/ML IJ SOLN
INTRAMUSCULAR | Status: DC | PRN
  Administered 2013-05-20: .8 mg via INTRAVENOUS

## 2013-05-20 MED ORDER — CHLORHEXIDINE GLUCONATE 4 % EX LIQD: 1.0000 "application " | Freq: Once | CUTANEOUS | Status: DC

## 2013-05-20 MED ORDER — HYDROMORPHONE 0.3 MG/ML IV SOLN
INTRAVENOUS | Status: DC
  Administered 2013-05-20: 3.9 mg via INTRAVENOUS
  Administered 2013-05-20 (×2): via INTRAVENOUS
  Administered 2013-05-20: 2.1 mg via INTRAVENOUS
  Administered 2013-05-21: 3 mg via INTRAVENOUS
  Administered 2013-05-21: 2.1 mg via INTRAVENOUS
  Administered 2013-05-21: 1.5 mg via INTRAVENOUS
  Administered 2013-05-21: 2.7 mg via INTRAVENOUS
  Administered 2013-05-21: 4.41 mg via INTRAVENOUS
  Administered 2013-05-21: 3.9 mg via INTRAVENOUS
  Administered 2013-05-21 (×2): via INTRAVENOUS
  Administered 2013-05-22: 1.5 mg via INTRAVENOUS
  Administered 2013-05-22: 2.1 mg via INTRAVENOUS
  Administered 2013-05-22: 07:00:00 via INTRAVENOUS
  Administered 2013-05-22: 3.3 mg via INTRAVENOUS
  Administered 2013-05-22: 18:00:00 via INTRAVENOUS
  Administered 2013-05-22: 3.9 mg via INTRAVENOUS
  Administered 2013-05-22: 3.3 mg via INTRAVENOUS
  Administered 2013-05-22: 2.7 mg via INTRAVENOUS
  Administered 2013-05-23: 3 mg via INTRAVENOUS
  Administered 2013-05-23: 06:00:00 via INTRAVENOUS
  Administered 2013-05-23: 2.7 mg via INTRAVENOUS
  Administered 2013-05-23: 1.7 mg via INTRAVENOUS
  Administered 2013-05-23: 14.2 mg via INTRAVENOUS
  Administered 2013-05-23: 1.2 mg via INTRAVENOUS
  Administered 2013-05-23: 17:00:00 via INTRAVENOUS
  Administered 2013-05-24: 4.5 mg via INTRAVENOUS
  Administered 2013-05-24: 1.2 mg via INTRAVENOUS
  Filled 2013-05-20 (×7): qty 25

## 2013-05-20 MED ORDER — HEPARIN SODIUM (PORCINE) 5000 UNIT/ML IJ SOLN
5000.0000 [IU] | Freq: Three times a day (TID) | INTRAMUSCULAR | Status: DC
  Administered 2013-05-21 – 2013-05-24 (×7): 5000 [IU] via SUBCUTANEOUS
  Filled 2013-05-20 (×20): qty 1

## 2013-05-20 MED ORDER — NEOSTIGMINE METHYLSULFATE 1 MG/ML IJ SOLN
INTRAMUSCULAR | Status: DC | PRN
  Administered 2013-05-20: 4 mg via INTRAVENOUS

## 2013-05-20 MED ORDER — ROCURONIUM BROMIDE 100 MG/10ML IV SOLN
INTRAVENOUS | Status: DC | PRN
  Administered 2013-05-20: 50 mg via INTRAVENOUS

## 2013-05-20 MED ORDER — ONDANSETRON HCL 4 MG/2ML IJ SOLN
4.0000 mg | Freq: Four times a day (QID) | INTRAMUSCULAR | Status: DC | PRN
  Administered 2013-05-22 – 2013-05-23 (×2): 4 mg via INTRAVENOUS
  Filled 2013-05-20 (×4): qty 2

## 2013-05-20 MED ORDER — FENTANYL CITRATE 0.05 MG/ML IJ SOLN
INTRAMUSCULAR | Status: DC | PRN
  Administered 2013-05-20: 100 ug via INTRAVENOUS
  Administered 2013-05-20 (×3): 50 ug via INTRAVENOUS
  Administered 2013-05-20: 100 ug via INTRAVENOUS
  Administered 2013-05-20 (×2): 150 ug via INTRAVENOUS
  Administered 2013-05-20: 100 ug via INTRAVENOUS

## 2013-05-20 MED ORDER — DIPHENHYDRAMINE HCL 50 MG/ML IJ SOLN
12.5000 mg | Freq: Four times a day (QID) | INTRAMUSCULAR | Status: DC | PRN
  Administered 2013-05-20 – 2013-05-23 (×4): 12.5 mg via INTRAVENOUS
  Filled 2013-05-20 (×4): qty 1

## 2013-05-20 MED ORDER — METOPROLOL TARTRATE 1 MG/ML IV SOLN
INTRAVENOUS | Status: AC
  Administered 2013-05-20: 5 mg via INTRAVENOUS
  Filled 2013-05-20: qty 5

## 2013-05-20 MED ORDER — NALOXONE HCL 0.4 MG/ML IJ SOLN: 0.4000 mg | INTRAMUSCULAR | Status: DC | PRN

## 2013-05-20 MED ORDER — MIDAZOLAM HCL 2 MG/2ML IJ SOLN
INTRAMUSCULAR | Status: AC
  Administered 2013-05-20: 1 mg
  Filled 2013-05-20: qty 2

## 2013-05-20 MED ORDER — DIPHENHYDRAMINE HCL 12.5 MG/5ML PO ELIX: 12.5000 mg | ORAL_SOLUTION | Freq: Four times a day (QID) | ORAL | Status: DC | PRN

## 2013-05-20 MED ORDER — SODIUM CHLORIDE 0.9 % IJ SOLN: 9.0000 mL | INTRAMUSCULAR | Status: DC | PRN

## 2013-05-20 MED ORDER — KCL IN DEXTROSE-NACL 20-5-0.9 MEQ/L-%-% IV SOLN
INTRAVENOUS | Status: DC
  Administered 2013-05-20 – 2013-05-25 (×10): via INTRAVENOUS
  Filled 2013-05-20 (×13): qty 1000

## 2013-05-20 MED ORDER — HYDRALAZINE HCL 20 MG/ML IJ SOLN
INTRAMUSCULAR | Status: AC
  Administered 2013-05-20: 5 mg via INTRAVENOUS
  Filled 2013-05-20: qty 1

## 2013-05-20 MED ORDER — HYDROMORPHONE HCL PF 1 MG/ML IJ SOLN
0.5000 mg | INTRAMUSCULAR | Status: DC | PRN
  Administered 2013-05-20 (×3): 0.5 mg via INTRAVENOUS

## 2013-05-20 MED ORDER — LACTATED RINGERS IV SOLN
INTRAVENOUS | Status: DC | PRN
  Administered 2013-05-20 (×3): via INTRAVENOUS

## 2013-05-20 MED ORDER — ONDANSETRON HCL 4 MG/2ML IJ SOLN: 4.0000 mg | Freq: Once | INTRAMUSCULAR | Status: DC | PRN

## 2013-05-20 MED ORDER — PANTOPRAZOLE SODIUM 40 MG IV SOLR
40.0000 mg | INTRAVENOUS | Status: DC
  Administered 2013-05-20 – 2013-05-24 (×5): 40 mg via INTRAVENOUS
  Filled 2013-05-20 (×6): qty 40

## 2013-05-20 MED ORDER — HYDROMORPHONE 0.3 MG/ML IV SOLN
INTRAVENOUS | Status: AC
  Filled 2013-05-20: qty 25

## 2013-05-20 SURGICAL SUPPLY — 44 items
BLADE SURG ROTATE 9660 (MISCELLANEOUS) ×2 IMPLANT
CANISTER SUCTION 2500CC (MISCELLANEOUS) ×2 IMPLANT
CANISTER WOUND CARE 500ML ATS (WOUND CARE) ×2 IMPLANT
CHLORAPREP W/TINT 26ML (MISCELLANEOUS) ×2 IMPLANT
COVER SURGICAL LIGHT HANDLE (MISCELLANEOUS) ×2 IMPLANT
DRAIN CHANNEL 19F RND (DRAIN) ×2 IMPLANT
DRAPE LAPAROSCOPIC ABDOMINAL (DRAPES) ×2 IMPLANT
DRAPE UTILITY 15X26 W/TAPE STR (DRAPE) ×4 IMPLANT
DRSG ADAPTIC 3X8 NADH LF (GAUZE/BANDAGES/DRESSINGS) ×2 IMPLANT
DRSG VAC ATS MED SENSATRAC (GAUZE/BANDAGES/DRESSINGS) ×2 IMPLANT
ELECT CAUTERY BLADE 6.4 (BLADE) ×2 IMPLANT
ELECT REM PT RETURN 9FT ADLT (ELECTROSURGICAL) ×2
ELECTRODE REM PT RTRN 9FT ADLT (ELECTROSURGICAL) ×1 IMPLANT
EVACUATOR SILICONE 100CC (DRAIN) ×2 IMPLANT
GLOVE BIO SURGEON STRL SZ 6.5 (GLOVE) ×2 IMPLANT
GLOVE BIO SURGEON STRL SZ7.5 (GLOVE) ×4 IMPLANT
GLOVE BIOGEL PI IND STRL 7.0 (GLOVE) ×2 IMPLANT
GLOVE BIOGEL PI INDICATOR 7.0 (GLOVE) ×2
GLOVE SURG SS PI 7.0 STRL IVOR (GLOVE) ×2 IMPLANT
GLOVE SURG SS PI 8.0 STRL IVOR (GLOVE) ×4 IMPLANT
GOWN PREVENTION PLUS XXLARGE (GOWN DISPOSABLE) ×2 IMPLANT
GOWN STRL NON-REIN LRG LVL3 (GOWN DISPOSABLE) ×6 IMPLANT
KIT BASIN OR (CUSTOM PROCEDURE TRAY) ×2 IMPLANT
KIT ROOM TURNOVER OR (KITS) ×2 IMPLANT
MARKER SKIN DUAL TIP RULER LAB (MISCELLANEOUS) ×2 IMPLANT
MESH VENTRALIGHT ST 10X13IN (Mesh General) ×2 IMPLANT
NS IRRIG 1000ML POUR BTL (IV SOLUTION) ×2 IMPLANT
PACK GENERAL/GYN (CUSTOM PROCEDURE TRAY) ×2 IMPLANT
PAD ARMBOARD 7.5X6 YLW CONV (MISCELLANEOUS) ×2 IMPLANT
SPONGE GAUZE 4X4 12PLY (GAUZE/BANDAGES/DRESSINGS) ×2 IMPLANT
SPONGE LAP 18X18 X RAY DECT (DISPOSABLE) ×4 IMPLANT
STAPLER VISISTAT 35W (STAPLE) ×2 IMPLANT
SUT ETHILON 3 0 PS 1 (SUTURE) ×2 IMPLANT
SUT NOVA NAB DX-16 0-1 5-0 T12 (SUTURE) ×14 IMPLANT
SUT PROLENE 1 CT (SUTURE) ×2 IMPLANT
SUT SILK 2 0 (SUTURE) ×1
SUT SILK 2-0 18XBRD TIE 12 (SUTURE) ×1 IMPLANT
SUT VIC AB 3-0 54X BRD REEL (SUTURE) ×1 IMPLANT
SUT VIC AB 3-0 BRD 54 (SUTURE) ×1
SUT VIC AB 3-0 SH 27 (SUTURE) ×1
SUT VIC AB 3-0 SH 27XBRD (SUTURE) ×1 IMPLANT
TOWEL OR 17X24 6PK STRL BLUE (TOWEL DISPOSABLE) ×2 IMPLANT
TOWEL OR 17X26 10 PK STRL BLUE (TOWEL DISPOSABLE) ×2 IMPLANT
TRAY FOLEY CATH 14FRSI W/METER (CATHETERS) ×2 IMPLANT

## 2013-05-20 NOTE — Anesthesia Postprocedure Evaluation (Signed)
  Anesthesia Post-op Note  Patient: Timothy [name]  Procedure(s) Performed: Procedure(s): VENTRAL HERNIA REPAIR  (N/A) INSERTION OF MESH (N/A)  Patient Location: PACU  Anesthesia Type:General  Level of Consciousness: awake, alert  and oriented  Airway and Oxygen Therapy: Patient Spontanous Breathing and Patient connected to nasal cannula oxygen  Post-op Pain: mild  Post-op Assessment: Post-op Vital signs reviewed  Post-op Vital Signs: Reviewed  Complications: No apparent anesthesia complications

## 2013-05-20 NOTE — H&P (Signed)
Timothy [name]  04/13/2013 9:40 AM   Office Visit  MRN:  [id number]   Description: 47 year old male  Provider: Robyne Askew, MD  Department: [facility]        Diagnoses    Ventral hernia    -  Primary    553.20      Reason for Visit    Routine Post Op    3 month abd recheck        Current Vitals - Last Recorded    BP Pulse Temp(Src) Resp Ht Wt    150/80 96 98.6 F (37 C) (Temporal) 14 5\' 10"  (1.778 m) 176 lb 3.2 oz (79.924 kg)       BMI              25.28 kg/m2                 Vitals History Recorded       Progress Notes    Robyne Askew, MD at 04/13/2013 10:17 AM    Status: Signed                   Patient ID: Timothy [name], male   DOB: 13-Mar-1966, 47 y.o.   MRN: [id number]    Chief Complaint   Patient presents with   .  Routine Post Op       3 month abd recheck        HPI Timothy [name] is a 47 y.o. male.  The patient is a 47 year old white male who is 9 months status post colostomy reversal. His postoperative course was complicated by a superficial wound infection and fascial dehiscence. Over the last 3 months his hernia has gotten significantly larger. It does cause him a lot of discomfort. He is ready to have the hernia fixed.  HPI    Past Medical History   Diagnosis  Date   .  Hypoglycemia     .  GERD (gastroesophageal reflux disease)     .  Diverticulosis         By colonoscopy June 2005   .  Lower GI bleed         June 2005. Presumably secondary to diverticulosis.   .  Anemia     .  Anxiety     .  Alcohol abuse     .  Hemorrhoids     .  Family history of anesthesia complication         Mother N/V   .  Bronchitis         history   .  Cellulitis         - left knee-2009   .  Glaucoma syndrome         does not use eye drops, "They burn".   .  Sleep apnea         witness apnea by wife.   .  Elevated CK  3/13   .  Depression     .  Incisional hernia     .  Prinzmetal angina     .  History of stomach ulcers  2005   .   Headache(784.0)         "alot; not daily" (07/23/2012)   .  Migraines         "not often" (07/23/2012)   .  Condyloma  02/04/2012   .  Acute bronchitis  02/15/2013  Past Surgical History   Procedure  Laterality  Date   .  Appendectomy    1982   .  Elbow fracture surgery    ~ 1975       left;  pins inserted    .  Wisdom tooth extraction    ~ 1983   .  Laparoscopic left colon resection    02/19/2012       SIGMOID   .  Wart fulguration    02/19/2012       Procedure: FULGURATION ANAL WART;  Surgeon: Robyne Askew, MD;  Location: [facility] OR;  Service: General;  Laterality: N/A;  Destroy  Anal Condyloma    .  Laparotomy    02/27/2012       Procedure: EXPLORATORY LAPAROTOMY;  Surgeon: Robyne Askew, MD;  Location: [facility] OR;  Service: General;  Laterality: N/A;   .  Colostomy    02/27/2012       Procedure: COLOSTOMY;  Surgeon: Robyne Askew, MD;  Location: [facility] OR;  Service: General;  Laterality: N/A;   .  Colon resection    02/27/2012       Procedure: COLON RESECTION;  Surgeon: Robyne Askew, MD;  Location: [facility] OR;  Service: General;  Laterality: N/A;   .  Colostomy takedown    07/23/2012   .  Abdominal exploration surgery    07/23/2012       w/LOA (07/23/2012)   .  Colostomy takedown    07/23/2012       Procedure: COLOSTOMY TAKEDOWN;  Surgeon: Robyne Askew, MD;  Location: [facility] OR;  Service: General;  Laterality: N/A;  Primary Anastomosis   .  Lesion destruction    07/23/2012       Procedure: DESTRUCTION LESION ANUS;  Surgeon: Robyne Askew, MD;  Location: [facility] OR;  Service: General;  Laterality: N/A;  DESTRUCTION ANAL CONDYLOMA   .  Lysis of adhesion    07/23/2012       Procedure: LYSIS OF ADHESION;  Surgeon: Robyne Askew, MD;  Location: [facility] OR;  Service: General;  Laterality: N/A;   .  Laparotomy    07/23/2012       Procedure: EXPLORATORY LAPAROTOMY;  Surgeon: Robyne Askew, MD;  Location: [facility] OR;  Service: General;  Laterality: N/A;         Family History   Problem  Relation  Age of Onset   .   Diabetes  Mother     .  Parkinsonism  Mother     .  Depression  Mother     .  Alcohol abuse  Father     .  Hypertension  Father     .  Breast cancer  Maternal Aunt     .  Stroke  Neg Hx     .  Heart disease  Neg Hx     .  Anxiety disorder  Father     .  Ulcers  Father     .  Colon polyps  Father     .  Diabetes  Mother     .  Diabetes  Maternal Grandmother          Social History History   Substance Use Topics   .  Smoking status:  Current Every Day Smoker -- 2.00 packs/day for 27 years       Types:  Cigarettes   .  Smokeless tobacco:  Former Neurosurgeon  Types:  Chew         Comment: 07/23/2012 "raely uses dip"   .  Alcohol Use:  No         Comment: h/o 24 PK/ WEEKEND; 07/23/2012 "sober since 02/12/2013"         Allergies   Allergen  Reactions   .  Ambien [Zolpidem Tartrate]  Other (See Comments)       Caused agitation of mood.    Leodis Liverpool [Propoxyphene-Acetaminophen]  Hives and Itching   .  Morphine And Related  Itching   .  Bactroban [Mupirocin Calcium]  Swelling       Caused infection at a surgical site         Current Outpatient Prescriptions   Medication  Sig  Dispense  Refill   .  ARIPiprazole (ABILIFY) 5 MG tablet  Take 7.5 mg by mouth daily. Take one and half of a 5 mg pill daily.  Per Dr. Kathrynn Running in [facility].  #45 with 3 refills.         .  busPIRone (BUSPAR) 15 MG tablet  Take 1 tablet (15 mg total) by mouth 4 (four) times daily.   120 tablet   1   .  diazepam (VALIUM) 5 MG tablet  Take 1 tablet (5 mg total) by mouth every 8 (eight) hours as needed for anxiety.   60 tablet   1   .  HYDROcodone-acetaminophen (NORCO) 10-325 MG per tablet  Take 1 tablet by mouth every 6 (six) hours as needed for pain.   90 tablet   0   .  lisinopril (PRINIVIL,ZESTRIL) 10 MG tablet  Take 2 tablets (20 mg total) by mouth daily.   30 tablet   5   .  ondansetron (ZOFRAN ODT) 4 MG disintegrating tablet  Take 1 tablet (4 mg total) by mouth every 8 (eight) hours as needed for nausea.   30  tablet   3   .  polyethylene glycol (MIRALAX / GLYCOLAX) packet  Take 17 g by mouth daily.         Marland Kitchen  albuterol (PROVENTIL HFA;VENTOLIN HFA) 108 (90 BASE) MCG/ACT inhaler  Inhale 2 puffs into the lungs every 4 (four) hours as needed for wheezing.   1 Inhaler   0       No current facility-administered medications for this visit.        Review of Systems Review of Systems  Constitutional: Negative.   HENT: Negative.   Eyes: Negative.   Respiratory: Positive for cough.   Cardiovascular: Negative.   Gastrointestinal: Positive for abdominal pain and abdominal distention.  Endocrine: Negative.   Genitourinary: Negative.   Musculoskeletal: Negative.   Skin: Negative.   Allergic/Immunologic: Negative.   Neurological: Negative.   Hematological: Negative.   Psychiatric/Behavioral: Negative.       Blood pressure 150/80, pulse 96, temperature 98.6 F (37 C), temperature source Temporal, resp. rate 14, height 5\' 10"  (1.778 m), weight 176 lb 3.2 oz (79.924 kg).   Physical Exam Physical Exam  Constitutional: He is oriented to person, place, and time. He appears well-developed and well-nourished.  HENT:   Head: Normocephalic and atraumatic.  Eyes: Conjunctivae and EOM are normal. Pupils are equal, round, and reactive to light.  Neck: Normal range of motion. Neck supple.  Cardiovascular: Normal rate, regular rhythm and normal heart sounds.   Pulmonary/Chest: Effort normal and breath sounds normal.  Abdominal: Soft. Bowel sounds are normal.  There is an obvious ventral hernia  with fascial separation measuring about 10-11 cm. It reduces easily. There is no sign of infection or obstruction  Musculoskeletal: Normal range of motion.  Neurological: He is alert and oriented to person, place, and time.  Skin: Skin is warm and dry.  Psychiatric: He has a normal mood and affect. His behavior is normal.      Data Reviewed As above   Assessment    The patient has a large symptomatic  ventral hernia. Because of the risk of it getting larger as well as the risk of incarceration and strangulation I think he would benefit from having this fixed. I have discussed with him in detail the risks and benefits of the operation to fix the hernia as well as some of the technical aspects including the use of mesh and the risk of infection and he understands and wishes to proceed      Plan    Plan for ventral hernia repair with mesh

## 2013-05-20 NOTE — OR Nursing (Signed)
Dr. Ivin Booty at bedside.  Pt reported pain to be at 4/10 and tolerable.  BP remains 175/116 despite pain control and labetolol 20 mg IV.  Dr. Ivin Booty ordered hydralazine.

## 2013-05-20 NOTE — Interval H&P Note (Signed)
History and Physical Interval Note:  05/20/2013 7:16 AM  Timothy [name]  has presented today for surgery, with the diagnosis of ventral hernia  The various methods of treatment have been discussed with the patient and family. After consideration of risks, benefits and other options for treatment, the patient has consented to  Procedure(s): VENTRAL HERNIA REPAIR  (N/A) INSERTION OF MESH (N/A) as a surgical intervention .  The patient's history has been reviewed, patient examined, no change in status, stable for surgery.  I have reviewed the patient's chart and labs.  Questions were answered to the patient's satisfaction.     TOTH III,PAUL S

## 2013-05-20 NOTE — OR Nursing (Signed)
Dr. Ivin Booty updated over the telephone.  Patients pain 6/10 now, improved from 10/10.  Pt. Remains hypertensive 211/111 despite achieving some pain control. Labetolol ordered and given. Will continue to treat pain.

## 2013-05-20 NOTE — Progress Notes (Signed)
Patient's BP 170/117.  Dr Carolynne Edouard Paged.

## 2013-05-20 NOTE — Anesthesia Preprocedure Evaluation (Signed)
Anesthesia Evaluation  Patient identified by MRN, date of birth, ID band Patient awake    Reviewed: Allergy & Precautions, H&P , NPO status , Patient's Chart, lab work & pertinent test results  Airway Mallampati: I TM Distance: >3 FB Neck ROM: Full    Dental  (+) Teeth Intact and Dental Advisory Given   Pulmonary  breath sounds clear to auscultation        Cardiovascular hypertension, Pt. on medications Rhythm:Regular Rate:Normal     Neuro/Psych    GI/Hepatic GERD-  Controlled,  Endo/Other    Renal/GU      Musculoskeletal   Abdominal   Peds  Hematology   Anesthesia Other Findings   Reproductive/Obstetrics                           Anesthesia Physical Anesthesia Plan  ASA: II  Anesthesia Plan: General   Post-op Pain Management:    Induction: Intravenous  Airway Management Planned: Oral ETT  Additional Equipment:   Intra-op Plan:   Post-operative Plan: Extubation in OR  Informed Consent: I have reviewed the patients History and Physical, chart, labs and discussed the procedure including the risks, benefits and alternatives for the proposed anesthesia with the patient or authorized representative who has indicated his/her understanding and acceptance.   Dental advisory given  Plan Discussed with: CRNA, Anesthesiologist and Surgeon  Anesthesia Plan Comments:         Anesthesia Quick Evaluation

## 2013-05-20 NOTE — Anesthesia Procedure Notes (Signed)
Procedure Name: Intubation Date/Time: 05/20/2013 7:42 AM Performed by: Whitman Hero Pre-anesthesia Checklist: Patient identified, Timeout performed, Emergency Drugs available, Suction available and Patient being monitored Patient Re-evaluated:Patient Re-evaluated prior to inductionOxygen Delivery Method: Circle system utilized Preoxygenation: Pre-oxygenation with 100% oxygen Intubation Type: IV induction Ventilation: Mask ventilation without difficulty Laryngoscope Size: Mac and 3 Grade View: Grade I Tube type: Oral Tube size: 7.5 mm Number of attempts: 1 Airway Equipment and Method: Stylet Placement Confirmation: ETT inserted through vocal cords under direct vision,  breath sounds checked- equal and bilateral and positive ETCO2 Secured at: 23 cm Tube secured with: Tape Dental Injury: Teeth and Oropharynx as per pre-operative assessment

## 2013-05-20 NOTE — Transfer of Care (Signed)
Immediate Anesthesia Transfer of Care Note  Patient: Timothy [name]  Procedure(s) Performed: Procedure(s): VENTRAL HERNIA REPAIR  (N/A) INSERTION OF MESH (N/A)  Patient Location: PACU  Anesthesia Type:General  Level of Consciousness: awake, alert  and oriented  Airway & Oxygen Therapy: Patient Spontanous Breathing and Patient connected to face mask oxygen  Post-op Assessment: Report given to PACU RN and Post -op Vital signs reviewed and stable  Post vital signs: Reviewed and stable  Complications: No apparent anesthesia complications

## 2013-05-20 NOTE — Telephone Encounter (Signed)
Called to check on Timothy [name].  Spoke to [employer].  Surgery went well.  [name] was very calm beforehand and seems to be doing fine.

## 2013-05-20 NOTE — OR Nursing (Signed)
Notified Dr. Carolynne Edouard regarding pts condition.  Pain 4/10, hr 112, bp 180/109 despite 20mg  IV labetolol and 20mg  IV hydralazine.  Dr. Carolynne Edouard ordered Metoprolol 5mg  IV every 6 hours.

## 2013-05-20 NOTE — OR Nursing (Addendum)
Patient arrived to PACU from OR on a bed.  Patient tachypneic, tachycardic, hypertensive, and rigid. 10/10 pain.  IV Dilaudid given and notified Dr. Ivin Booty of situation.  Dr. Ivin Booty ordered additional Dilaudid. Note entered by Norva Riffle RN.

## 2013-05-20 NOTE — Op Note (Signed)
05/20/2013  10:27 AM  PATIENT:  Timothy [name]  47 y.o. male  PRE-OPERATIVE DIAGNOSIS:  Ventral hernia  POST-OPERATIVE DIAGNOSIS:  Ventral hernia  PROCEDURE:  Procedure(s): VENTRAL HERNIA REPAIR  (N/A) INSERTION OF MESH (N/A)  SURGEON:  Surgeon(s) and Role:    * Robyne Askew, MD - Primary    * Romie Levee, MD - Assisting  PHYSICIAN ASSISTANT:   ASSISTANTS: Dr. Maisie Fus   ANESTHESIA:   general  EBL:  Total I/O In: 1000 [I.V.:1000] Out: -   BLOOD ADMINISTERED:none  DRAINS: (1) Jackson-Pratt drain(s) with closed bulb suction in the subq   LOCAL MEDICATIONS USED:  NONE  SPECIMEN:  No Specimen  DISPOSITION OF SPECIMEN:  N/A  COUNTS:  YES  TOURNIQUET:  * No tourniquets in log *  DICTATION: .Dragon Dictation After informed consent was obtained the patient was brought to the operating room and placed in the supine position on the operating room table. After adequate induction of general anesthesia the patient's abdomen was prepped with ChloraPrep, allowed to dry, and draped in usual sterile manner. An incision was made with a 10 blade knife around the patient's old scar. This incision was carried through the skin and subcutaneous tissue sharply with the electrocautery. We were able to identify the hernia sac and opened the sac sharply with electrocautery. The rest of the incision was then opened under direct vision with electrocautery. There was some omentum adherent in the hernia sac. This was freed sharply with the electrocautery. Once this was accomplished we were able to remove the entire scar. We were able to identify the fascial edges. There was some small bowel adherent to the anterior abdominal wall but this was fairly minimal and we were able to take this down sharply with Metzenbaum scissors. Next we were able to dissect the subcutaneous tissue off the anterior abdominal wall fascia to mobilize the fascia. This was done circumferentially with the electrocautery. Once  this was accomplished the fascial edges appeared healthy and it looked as though we would be able to approximate the fascial edges along the midline. We then chose a 30 x 25 cm piece of ventral light mesh that was coated. We cut this mesh to fit. We then placed this mesh into the abdominal cavity with the coated side down. We then anchored the mesh circumferentially around the fascial defect about 6-7 cm back from the fascial edge with interrupted #1 Novafil U stitches. Once this was accomplished the mesh was in good position without any redundancy and there were no gaps between the stitches. The wound was then irrigated with antibiotic solution. The fascial edges were then reapproximated over the mesh with interrupted figure-of-eight #1 Novafil stitches. The subcutaneous tissue was then irrigated with copious amounts of saline. The hernia sac was removed sharply with the electrocautery. A small stab incision was made in the right lower quadrant with a 15 blade knife. A hemostat was placed through this opening and used to bring a 19 [place] round Blake drain into the operative bed. The drain was placed along the anterior surface of the abdominal wall fascia in the subcutaneous space. The drain was anchored to the skin with a 3-0 nylon stitch. The skin was then closed with staples. A skin vacuum dressing was then applied. The drain was placed to bulb suction and there was a good seal. The patient tolerated the procedure well. At the end of the case all needle sponge and instrument counts were correct. The patient was then awakened  and taken to recovery in stable condition  PLAN OF CARE: Admit to inpatient   PATIENT DISPOSITION:  PACU - hemodynamically stable.   Delay start of Pharmacological VTE agent (>24hrs) due to surgical blood loss or risk of bleeding: no

## 2013-05-21 ENCOUNTER — Encounter (HOSPITAL_COMMUNITY): Payer: Self-pay | Admitting: General Surgery

## 2013-05-21 LAB — BASIC METABOLIC PANEL
BUN: 8 mg/dL (ref 6–23)
CO2: 25 mEq/L (ref 19–32)
Calcium: 9.1 mg/dL (ref 8.4–10.5)
Chloride: 99 mEq/L (ref 96–112)
Creatinine, Ser: 0.6 mg/dL (ref 0.50–1.35)
Glucose, Bld: 154 mg/dL — ABNORMAL HIGH (ref 70–99)

## 2013-05-21 LAB — CBC
HCT: 42.9 % (ref 39.0–52.0)
MCH: 29.4 pg (ref 26.0–34.0)
MCHC: 32.9 g/dL (ref 30.0–36.0)
MCV: 89.6 fL (ref 78.0–100.0)
Platelets: 367 10*3/uL (ref 150–400)
RDW: 13.7 % (ref 11.5–15.5)

## 2013-05-21 MED ORDER — LABETALOL HCL 5 MG/ML IV SOLN
10.0000 mg | INTRAVENOUS | Status: DC | PRN
  Administered 2013-05-21 – 2013-05-22 (×2): 10 mg via INTRAVENOUS
  Filled 2013-05-21 (×2): qty 4

## 2013-05-21 NOTE — Progress Notes (Signed)
1 Day Post-Op open VHR with mesh Subjective: Doing better today.  Min nausea.  Pain controlled with PCA.  BP better  Objective: Vital signs in last 24 hours: Temp:  [97.5 F (36.4 C)-98.9 F (37.2 C)] 97.5 F (36.4 C) (10/31 1037) Pulse Rate:  [115-140] 115 (10/31 1037) Resp:  [18-29] 20 (10/31 1200) BP: (143-170)/(88-117) 160/94 mmHg (10/31 1037) SpO2:  [94 %-99 %] 94 % (10/31 1200)   Intake/Output from previous day: 10/30 0701 - 10/31 0700 In: 4200 [I.V.:4200] Out: 4100 [Urine:3120; Emesis/NG output:600; Drains:280; Blood:100] Intake/Output this shift:     General appearance: alert and cooperative GI: normal findings: soft, non-distended  Incision: incisional vac in place  Lab Results:   Recent Labs  05/20/13 1505 05/21/13 0537  WBC 19.7* 14.9*  HGB 14.5 14.1  HCT 43.2 42.9  PLT 364 367   BMET  Recent Labs  05/20/13 1505 05/21/13 0537  NA  --  135  K  --  4.0  CL  --  99  CO2  --  25  GLUCOSE  --  154*  BUN  --  8  CREATININE 0.65 0.60  CALCIUM  --  9.1   PT/INR No results found for this basename: LABPROT, INR,  in the last 72 hours ABG No results found for this basename: PHART, PCO2, PO2, HCO3,  in the last 72 hours  MEDS, Scheduled . heparin  5,000 Units Subcutaneous Q8H  . HYDROmorphone PCA 0.3 mg/mL   Intravenous Q4H  . pantoprazole (PROTONIX) IV  40 mg Intravenous Q24H    Studies/Results: No results found.  Assessment: s/p Procedure(s): VENTRAL HERNIA REPAIR  INSERTION OF MESH Patient Active Problem List   Diagnosis Date Noted  . Abdominal pain, chronic, right upper quadrant 04/15/2013  . Akathisia 03/02/2013  . Hyperglycemia 03/02/2013  . Abdominal wall hernia 02/02/2013  . Ventral hernia 01/11/2013  . Nausea with vomiting 01/04/2013  . Migraine headache 08/23/2012  . Wound infection after surgery 08/13/2012  . Essential hypertension, benign 06/28/2012  . Bipolar disorder, suspect Bipolar I 06/02/2012  . Insomnia 04/27/2012   . Panic anxiety syndrome 03/21/2012  . History of colostomy 03/21/2012  . History of alcohol abuse 10/29/2011  . History of diverticulitis of colon 10/16/2011  . Tobacco abuse counseling 10/16/2011    Expected post op course.    Plan: Cont current management PRN labetalol increased to 10mg  IV Q6h PRN to better control BP and HR Cont NPO, NGT, decrease IVF's due to excellent UOP OOB to chair today  Cont PCA  LOS: 1 day     .Vanita Panda, MD [facility], [place] [phone]   05/21/2013 1:32 PM

## 2013-05-21 NOTE — Progress Notes (Signed)
As the patient's PCP, I stopped by to visit Timothy [name]. He was awake and in a positive mood about the surgery. He is in pain, but controlled with PCA. I appreciate the great care provided by Dr. Carolynne Edouard and the surgery team. Please contact the family medicine inpatient team if we can be of any assistance during the hospitalization.   Si Raider [name], MD, MBA 05/21/2013, 11:32 AM Family Medicine Resident, PGY-3

## 2013-05-21 NOTE — Progress Notes (Signed)
MD contacted for parameters on negative pressure wound therapy. pts' had strong 3+ pedal pulses. No signs of redness or swelling at the operative site.

## 2013-05-21 NOTE — Progress Notes (Signed)
Patient HR elevated at 145, BP currently 131/81. Pt has orders for PRN labetalol but BP parameters not met. MD on call, Lindie Spruce paged to clarify if labetalol was appropriate to be given. He stated that it was all right to give labetalol to pt if HR was > 120. Will given ordered Labetalol and monitor.

## 2013-05-22 NOTE — Progress Notes (Signed)
2 Days Post-Op   Assessment: s/p Procedure(s): VENTRAL HERNIA REPAIR  INSERTION OF MESH Patient Active Problem List   Diagnosis Date Noted  . Abdominal pain, chronic, right upper quadrant 04/15/2013  . Akathisia 03/02/2013  . Hyperglycemia 03/02/2013  . Abdominal wall hernia 02/02/2013  . Ventral hernia 01/11/2013  . Nausea with vomiting 01/04/2013  . Migraine headache 08/23/2012  . Wound infection after surgery 08/13/2012  . Essential hypertension, benign 06/28/2012  . Bipolar disorder, suspect Bipolar I 06/02/2012  . Insomnia 04/27/2012  . Panic anxiety syndrome 03/21/2012  . History of colostomy 03/21/2012  . History of alcohol abuse 10/29/2011  . History of diverticulitis of colon 10/16/2011  . Tobacco abuse counseling 10/16/2011    Stable from surgery, bowel function not returned yet  Plan: Will try clampin ng, check labs in am  Subjective: Feels ok doesn't like ng, not having much pain, passed no gas or had a bm yet  Objective: Vital signs in last 24 hours: Temp:  [98 F (36.7 C)-99.8 F (37.7 C)] 98.9 F (37.2 C) (11/01 1015) Pulse Rate:  [115-145] 115 (11/01 1015) Resp:  [16-20] 20 (11/01 1123) BP: (119-155)/(70-91) 119/70 mmHg (11/01 1015) SpO2:  [93 %-97 %] 94 % (11/01 1123)   Intake/Output from previous day: 10/31 0701 - 11/01 0700 In: 2345 [I.V.:2345] Out: 3115 [Urine:2225; Emesis/NG output:800; Drains:65]  General appearance: alert, cooperative and no distress Resp: clear to auscultation bilaterally Cardio: regular rate and rhythm, S1, S2 normal, no murmur, click, rub or gallop GI: Soft, mildly tender, no rebound, no BS  Incision: Wound vac on incision, JP thin red  Lab Results:   Recent Labs  05/20/13 1505 05/21/13 0537  WBC 19.7* 14.9*  HGB 14.5 14.1  HCT 43.2 42.9  PLT 364 367   BMET  Recent Labs  05/20/13 1505 05/21/13 0537  NA  --  135  K  --  4.0  CL  --  99  CO2  --  25  GLUCOSE  --  154*  BUN  --  8  CREATININE 0.65  0.60  CALCIUM  --  9.1    MEDS, Scheduled . heparin  5,000 Units Subcutaneous Q8H  . HYDROmorphone PCA 0.3 mg/mL   Intravenous Q4H  . pantoprazole (PROTONIX) IV  40 mg Intravenous Q24H    Studies/Results: No results found.    LOS: 2 days     Currie Paris, MD, [facility], [place] [phone]   05/22/2013 11:47 AM

## 2013-05-22 NOTE — Progress Notes (Signed)
Pt encouraged to get out of bed at least to the chair. Stated he wanted to wait until he could get rid of a "a few tubes first."

## 2013-05-23 LAB — CBC
HCT: 38.7 % — ABNORMAL LOW (ref 39.0–52.0)
Hemoglobin: 12.7 g/dL — ABNORMAL LOW (ref 13.0–17.0)
MCH: 30 pg (ref 26.0–34.0)
MCHC: 32.8 g/dL (ref 30.0–36.0)
MCV: 91.3 fL (ref 78.0–100.0)
Platelets: 338 10*3/uL (ref 150–400)
RBC: 4.24 MIL/uL (ref 4.22–5.81)

## 2013-05-23 NOTE — Progress Notes (Signed)
3 Days Post-Op   Assessment: s/p Procedure(s): VENTRAL HERNIA REPAIR  INSERTION OF MESH Patient Active Problem List   Diagnosis Date Noted  . Abdominal pain, chronic, right upper quadrant 04/15/2013  . Akathisia 03/02/2013  . Hyperglycemia 03/02/2013  . Ventral hernia 01/11/2013  . Nausea with vomiting 01/04/2013  . Migraine headache 08/23/2012  . Essential hypertension, benign 06/28/2012  . Bipolar disorder, suspect Bipolar I 06/02/2012  . Insomnia 04/27/2012  . Panic anxiety syndrome 03/21/2012  . History of colostomy 03/21/2012  . History of alcohol abuse 10/29/2011  . History of diverticulitis of colon 10/16/2011  . Tobacco abuse counseling 10/16/2011    Stable post op, didn't tolerate ng clamping yesterday so will try again today  Plan: Clamp NG and if tolerated may be able to remove  Subjective: Feels OK hasn't ambulated much. Got nausea with NG clamping yesterday so on suction again. Not passed gas or had a BM yet. Pain seems to be controlled.  Objective: Vital signs in last 24 hours: Temp:  [97.5 F (36.4 C)-98.5 F (36.9 C)] 97.7 F (36.5 C) (11/02 0953) Pulse Rate:  [109-140] 123 (11/02 0953) Resp:  [14-21] 16 (11/02 0953) BP: (124-134)/(63-89) 126/69 mmHg (11/02 0953) SpO2:  [92 %-96 %] 96 % (11/02 0953)   Intake/Output from previous day: 11/01 0701 - 11/02 0700 In: 2400 [I.V.:2400] Out: 2525 [Urine:1115; Emesis/NG output:1400; Drains:10]  General appearance: alert, cooperative and no distress Resp: clear to auscultation bilaterally Cardio: regular rate and rhythm, S1, S2 normal, no murmur, click, rub or gallop GI: Soft, some incisional tenderness, no BS  Incision: dressing dry, minimal out JP  Lab Results:   Recent Labs  05/21/13 0537 05/23/13 0558  WBC 14.9* 9.7  HGB 14.1 12.7*  HCT 42.9 38.7*  PLT 367 338   BMET  Recent Labs  05/20/13 1505 05/21/13 0537  NA  --  135  K  --  4.0  CL  --  99  CO2  --  25  GLUCOSE  --  154*  BUN   --  8  CREATININE 0.65 0.60  CALCIUM  --  9.1    MEDS, Scheduled . heparin  5,000 Units Subcutaneous Q8H  . HYDROmorphone PCA 0.3 mg/mL   Intravenous Q4H  . pantoprazole (PROTONIX) IV  40 mg Intravenous Q24H    Studies/Results: No results found.    LOS: 3 days     Currie Paris, MD, [facility], [place] [phone]   05/23/2013 10:58 AM

## 2013-05-24 MED ORDER — OXYCODONE-ACETAMINOPHEN 5-325 MG PO TABS
1.0000 | ORAL_TABLET | ORAL | Status: DC | PRN
  Administered 2013-05-24 – 2013-05-26 (×9): 2 via ORAL
  Filled 2013-05-24 (×6): qty 2
  Filled 2013-05-24 (×2): qty 1
  Filled 2013-05-24 (×2): qty 2

## 2013-05-24 NOTE — Progress Notes (Signed)
4 Days Post-Op  Subjective: Complains only of some soreness. Otherwise he seems to be doing well. No nausea since ng came out  Objective: Vital signs in last 24 hours: Temp:  [97.5 F (36.4 C)-98.9 F (37.2 C)] 97.8 F (36.6 C) (11/03 0521) Pulse Rate:  [88-123] 88 (11/03 0521) Resp:  [15-18] 16 (11/03 0521) BP: (119-138)/(65-85) 125/74 mmHg (11/03 0521) SpO2:  [94 %-97 %] 97 % (11/03 0521) Last BM Date: 05/19/13  Intake/Output from previous day: 11/02 0701 - 11/03 0700 In: 50 [NG/GT:50] Out: 1454 [Urine:850; Emesis/NG output:600; Drains:4] Intake/Output this shift:    Resp: clear to auscultation bilaterally Cardio: regular rate and rhythm GI: soft, good bs. dressings intact  Lab Results:   Recent Labs  05/23/13 0558  WBC 9.7  HGB 12.7*  HCT 38.7*  PLT 338   BMET No results found for this basename: NA, K, CL, CO2, GLUCOSE, BUN, CREATININE, CALCIUM,  in the last 72 hours PT/INR No results found for this basename: LABPROT, INR,  in the last 72 hours ABG No results found for this basename: PHART, PCO2, PO2, HCO3,  in the last 72 hours  Studies/Results: No results found.  Anti-infectives: Anti-infectives   Start     Dose/Rate Route Frequency Ordered Stop   05/20/13 0907  polymyxin B 500,000 Units, bacitracin 50,000 Units in sodium chloride irrigation 0.9 % 500 mL irrigation  Status:  Discontinued       As needed 05/20/13 0908 05/20/13 1035   05/20/13 0600  ceFAZolin (ANCEF) IVPB 2 g/50 mL premix     2 g 100 mL/hr over 30 Minutes Intravenous On call to O.R. 05/19/13 1528 05/20/13 0747      Assessment/Plan: [name]/p Procedure([name]): VENTRAL HERNIA REPAIR  (N/A) INSERTION OF MESH (N/A) Advance diet. Start clears ambulate  LOS: 4 days    Timothy [name],Timothy [name] 05/24/2013

## 2013-05-25 MED ORDER — PANTOPRAZOLE SODIUM 40 MG PO TBEC
40.0000 mg | DELAYED_RELEASE_TABLET | Freq: Every day | ORAL | Status: DC
  Administered 2013-05-25: 40 mg via ORAL
  Filled 2013-05-25: qty 1

## 2013-05-25 NOTE — Progress Notes (Signed)
5 Days Post-Op  Subjective: Doing well. Pain under good control. He reports bm's overnight  Objective: Vital signs in last 24 hours: Temp:  [97.9 F (36.6 C)-98 F (36.7 C)] 97.9 F (36.6 C) (11/04 0531) Pulse Rate:  [88] 88 (11/04 0531) Resp:  [16] 16 (11/04 0531) BP: (124-135)/(69-82) 135/82 mmHg (11/04 0531) SpO2:  [97 %-98 %] 98 % (11/04 0531) Last BM Date: 05/25/13  Intake/Output from previous day: 11/03 0701 - 11/04 0700 In: -  Out: 10 [Drains:10] Intake/Output this shift:    Resp: clear to auscultation bilaterally Cardio: regular rate and rhythm GI: soft, nontender. good bs. incision looks good  Lab Results:   Recent Labs  05/23/13 0558  WBC 9.7  HGB 12.7*  HCT 38.7*  PLT 338   BMET No results found for this basename: NA, K, CL, CO2, GLUCOSE, BUN, CREATININE, CALCIUM,  in the last 72 hours PT/INR No results found for this basename: LABPROT, INR,  in the last 72 hours ABG No results found for this basename: PHART, PCO2, PO2, HCO3,  in the last 72 hours  Studies/Results: No results found.  Anti-infectives: Anti-infectives   Start     Dose/Rate Route Frequency Ordered Stop   05/20/13 0907  polymyxin B 500,000 Units, bacitracin 50,000 Units in sodium chloride irrigation 0.9 % 500 mL irrigation  Status:  Discontinued       As needed 05/20/13 0908 05/20/13 1035   05/20/13 0600  ceFAZolin (ANCEF) IVPB 2 g/50 mL premix     2 g 100 mL/hr over 30 Minutes Intravenous On call to O.R. 05/19/13 1528 05/20/13 0747      Assessment/Plan: s/p Procedure(s): VENTRAL HERNIA REPAIR  (N/A) INSERTION OF MESH (N/A) Advance diet. Start regular food D/c vac Ambulate. Plan for discharge tomorrow  LOS: 5 days    TOTH III,[place]y Plaza S 05/25/2013

## 2013-05-25 NOTE — Progress Notes (Signed)
Pt educated on how to empty and re-charge JP drain. Pt verbalized understanding. Pt was given handout to review and to document output on when pt is d/c tomorrow. Pt's wife not present for teaching but will review again when wife is present at the beside.

## 2013-05-26 NOTE — Progress Notes (Signed)
6 Days Post-Op  Subjective: No complaints  Objective: Vital signs in last 24 hours: Temp:  [97.7 F (36.5 C)-98.2 F (36.8 C)] 97.7 F (36.5 C) (11/05 0643) Pulse Rate:  [83-84] 83 (11/05 0643) Resp:  [18-19] 18 (11/05 0643) BP: (125-131)/(68-83) 131/83 mmHg (11/05 0643) SpO2:  [97 %-99 %] 99 % (11/05 0643) Last BM Date: 05/25/13  Intake/Output from previous day: 11/04 0701 - 11/05 0700 In: 680 [P.O.:680] Out: 1535 [Urine:1525; Drains:10] Intake/Output this shift:    Resp: clear to auscultation bilaterally Cardio: regular rate and rhythm GI: soft, nontender. good bs. incision looks good with small amount of serous drainage  Lab Results:  No results found for this basename: WBC, HGB, HCT, PLT,  in the last 72 hours BMET No results found for this basename: NA, K, CL, CO2, GLUCOSE, BUN, CREATININE, CALCIUM,  in the last 72 hours PT/INR No results found for this basename: LABPROT, INR,  in the last 72 hours ABG No results found for this basename: PHART, PCO2, PO2, HCO3,  in the last 72 hours  Studies/Results: No results found.  Anti-infectives: Anti-infectives   Start     Dose/Rate Route Frequency Ordered Stop   05/20/13 0907  polymyxin B 500,000 Units, bacitracin 50,000 Units in sodium chloride irrigation 0.9 % 500 mL irrigation  Status:  Discontinued       As needed 05/20/13 0908 05/20/13 1035   05/20/13 0600  ceFAZolin (ANCEF) IVPB 2 g/50 mL premix     2 g 100 mL/hr over 30 Minutes Intravenous On call to O.R. 05/19/13 1528 05/20/13 0747      Assessment/Plan: s/p Procedure(s): VENTRAL HERNIA REPAIR  (N/A) INSERTION OF MESH (N/A) Discharge  LOS: 6 days    TOTH III,Shadoe Cryan S 05/26/2013

## 2013-05-26 NOTE — Progress Notes (Signed)
Discharge Note. Pt and pt's wife at bedside for discharge instructions. Pt and wife verbalized understanding of drain care, empty and recharging of drain, how to care for incision site, when to call the MD, and follow up appointments and Rx's. Pt is ready for discharge.

## 2013-05-26 NOTE — Discharge Summary (Signed)
Physician Discharge Summary  Patient ID: Timothy [name] MRN: [id number] DOB/AGE: Dec 06, 1965 47 y.o.  Admit date: 05/20/2013 Discharge date: 05/26/2013  Admission Diagnoses:  Discharge Diagnoses:  Principal Problem:   Ventral hernia   Discharged Condition: good  Hospital Course: the pt underwent ventral hernia repair with mesh. Postoperatively he had an ileus that persisted for several days. This gradually improved. He is now having bm's and his pain is good and his incision looks ok. He is ready for discharge home.  Consults: None  Significant Diagnostic Studies: none  Treatments: surgery: as above  Discharge Exam: Blood pressure 131/83, pulse 83, temperature 97.7 F (36.5 C), temperature source Oral, resp. rate 18, height 5\' 10"  (1.778 m), weight 195 lb 15.8 oz (88.9 kg), SpO2 99.00%. GI: soft, nontender  Disposition: 01-Home or Self Care  Discharge Orders   Future Appointments Provider Department Dept Phone   06/02/2013 2:40 PM Robyne Askew, MD [facility], [place] [phone]   06/08/2013 9:40 AM Robyne Askew, MD [facility], [place] [phone]   06/16/2013 9:00 AM Threasa Beards, PsyD [facility] Medicine [facility] [phone]   Future Orders Complete By Expires   Call MD for:  difficulty breathing, headache or visual disturbances  As directed    Call MD for:  extreme fatigue  As directed    Call MD for:  hives  As directed    Call MD for:  persistant dizziness or light-headedness  As directed    Call MD for:  persistant nausea and vomiting  As directed    Call MD for:  redness, tenderness, or signs of infection (pain, swelling, redness, odor or green/yellow discharge around incision site)  As directed    Call MD for:  severe uncontrolled pain  As directed    Call MD for:  temperature >100.4  As directed    Diet - low sodium heart healthy  As directed    Discharge instructions  As directed    Comments:     Sponge bathe until drain  removed. No lifting. Change dressing as often as needed   Increase activity slowly  As directed        Medication List         ARIPiprazole 5 MG tablet  Commonly known as:  ABILIFY  Take 5 mg by mouth every morning.     busPIRone 15 MG tablet  Commonly known as:  BUSPAR  Take 15 mg by mouth 4 (four) times daily.     diazepam 5 MG tablet  Commonly known as:  VALIUM  Take 5 mg by mouth 3 (three) times daily as needed for anxiety.     HYDROcodone-acetaminophen 10-325 MG per tablet  Commonly known as:  NORCO  Take 1 tablet by mouth 3 (three) times daily as needed for pain.     lisinopril 10 MG tablet  Commonly known as:  PRINIVIL,ZESTRIL  Take 20 mg by mouth daily.     ondansetron 4 MG disintegrating tablet  Commonly known as:  ZOFRAN ODT  Take 1 tablet (4 mg total) by mouth every 8 (eight) hours as needed for nausea.     polyethylene glycol packet  Commonly known as:  MIRALAX / GLYCOLAX  Take 17 g by mouth daily.     varenicline 1 MG tablet  Commonly known as:  CHANTIX  Take 1 mg by mouth 2 (two) times daily.           Follow-up Information   Follow  up with Robyne Askew, MD In 1 week.   Specialty:  General Surgery   Contact information:   [address] [place] [address] [phone]       Signed: Robyne Askew 05/26/2013, 8:26 AM

## 2013-05-27 ENCOUNTER — Telehealth: Payer: Self-pay | Admitting: Psychology

## 2013-05-27 NOTE — Telephone Encounter (Signed)
Timothy [name] left a VM yesterday.  Called today to talk.  Reports he is doing well after the surgery.  Says he isn't doing "anything but lying around."  Reframed this as him doing the important work of healing.  He smoked two cigarettes today so far.  Says they are [name]'s and he is helping her finish them - they don't intend to buy more.  Pleased with how his belly looks.  Someone is with him all the time now and he would like to be alone some.  Overall doing well.  Will talk next Wednesday after his follow-up with Dr. Carolynne Edouard and schedule after he is able to return to driving.

## 2013-05-31 ENCOUNTER — Telehealth (INDEPENDENT_AMBULATORY_CARE_PROVIDER_SITE_OTHER): Payer: Self-pay | Admitting: *Deleted

## 2013-05-31 NOTE — Telephone Encounter (Signed)
Patient's wife called to report that she is having a hard time finding the antibacterial wipes that Dr. Carolynne Edouard said patient needed to use.  She states she has called medical supply stores and looked a pharmacies without being able to find them.  Spoke to [facility] and it was decided to have patient use antibacterial soap with sponge baths.  Wife states understanding and agreeable at this time.

## 2013-06-02 ENCOUNTER — Encounter (INDEPENDENT_AMBULATORY_CARE_PROVIDER_SITE_OTHER): Payer: Self-pay | Admitting: General Surgery

## 2013-06-02 ENCOUNTER — Ambulatory Visit (INDEPENDENT_AMBULATORY_CARE_PROVIDER_SITE_OTHER): Payer: Medicaid Other | Admitting: General Surgery

## 2013-06-02 VITALS — BP 148/84 | HR 100 | Temp 97.6°F | Resp 14 | Ht 70.0 in | Wt 170.0 lb

## 2013-06-02 DIAGNOSIS — K439 Ventral hernia without obstruction or gangrene: Secondary | ICD-10-CM

## 2013-06-02 NOTE — Patient Instructions (Signed)
May shower tomorrow. Replace gauze and binder afterward

## 2013-06-03 NOTE — Progress Notes (Signed)
Subjective:     Patient ID: Timothy [name], male   DOB: [birth year], 47 y.o.   MRN: [id number]  HPI The patient is a 47 year old white male who is 2 weeks status post large ventral hernia repair with mesh. He has been taking it easy at home and try not to do too much. He is continuing to smoke. He only complains of some bloody drainage from the midportion of the incision. His drain is putting out minimal amounts of fluid. He denies any fevers or chills. His appetite is good and his bowels are working normally.  Review of Systems     Objective:   Physical Exam On exam his midline incision is healing nicely with no sign of infection. There may be a small hematoma at the skin edge which is what seems to be draining but there does not appear to be any big hematoma beneath the skin where the hernia was repaired. His drain was removed without difficulty and he tolerated this well.    Assessment:     The patient is 2 weeks status post ventral hernia repair with mesh     Plan:     At this point I think it would be safe for him to get in the shower tomorrow morning and washed with an antibacterial soap. I would like him to continue to keep the staples covered with gauze and keep his abdominal binder on after a shower. I will plan to see him back in one week to check his progress. He is to continue to refrain from any strenuous activity

## 2013-06-08 ENCOUNTER — Other Ambulatory Visit: Payer: Self-pay | Admitting: Family Medicine

## 2013-06-08 ENCOUNTER — Encounter (INDEPENDENT_AMBULATORY_CARE_PROVIDER_SITE_OTHER): Payer: Self-pay | Admitting: General Surgery

## 2013-06-08 ENCOUNTER — Ambulatory Visit (INDEPENDENT_AMBULATORY_CARE_PROVIDER_SITE_OTHER): Payer: Medicaid Other | Admitting: General Surgery

## 2013-06-08 VITALS — BP 156/84 | HR 100 | Temp 97.2°F | Resp 18 | Ht 70.0 in | Wt 168.0 lb

## 2013-06-08 DIAGNOSIS — K439 Ventral hernia without obstruction or gangrene: Secondary | ICD-10-CM

## 2013-06-08 MED ORDER — OXYCODONE-ACETAMINOPHEN 5-325 MG PO TABS: 1.0000 | ORAL_TABLET | ORAL | Status: DC | PRN

## 2013-06-08 NOTE — Progress Notes (Signed)
Subjective:     Patient ID: Timothy [name], male   DOB: 1965/10/07, 47 y.o.   MRN: [id number]  HPI The patient is a 47 year old white male who is 3 weeks status post large ventral hernia repair with mesh. He seems to be doing well and is taken it easy at home. He has continued to have some drainage from the midportion of the incision but this seems to be stopping now. He has not had any fevers or chills. His abdominal soreness is gradually improving although he still feels very tight.  Review of Systems     Objective:   Physical Exam On exam his abdomen is soft with minimal tenderness. His midline incision is almost completely healed except for 2 small openings along the lower portion of the incision. There is minimal tunneling to either open area. There is no obvious sign of infection. His staples were removed without difficulty and the open areas were packed with gauze. He tolerated this well.    Assessment:     The patient is 3 weeks status post large ventral hernia repair with mesh     Plan:     At this point I would like him to shower twice a day and packed each of the open areas with a small piece of gauze. He will continue to refrain from any heavy lifting. We will plan to see him back in about 2 weeks when he gets back from his Thanksgiving holiday

## 2013-06-08 NOTE — Patient Instructions (Signed)
Shower twice a day and pack open areas with 4X4 and 2X2

## 2013-06-10 ENCOUNTER — Encounter: Payer: Self-pay | Admitting: Family Medicine

## 2013-06-10 ENCOUNTER — Ambulatory Visit (INDEPENDENT_AMBULATORY_CARE_PROVIDER_SITE_OTHER): Payer: Medicaid Other | Admitting: Family Medicine

## 2013-06-10 VITALS — BP 120/82 | HR 100 | Ht 70.0 in | Wt 167.0 lb

## 2013-06-10 DIAGNOSIS — F410 Panic disorder [episodic paroxysmal anxiety] without agoraphobia: Secondary | ICD-10-CM

## 2013-06-10 DIAGNOSIS — F411 Generalized anxiety disorder: Secondary | ICD-10-CM

## 2013-06-10 MED ORDER — DIAZEPAM 5 MG PO TABS: 5.0000 mg | ORAL_TABLET | Freq: Two times a day (BID) | ORAL | Status: DC | PRN

## 2013-06-10 NOTE — Progress Notes (Signed)
  Subjective:    Patient ID: Timothy [name], male    DOB: 1966-06-03, 47 y.o.   MRN: [id number]  HPI  Anxiety evaluation - Anxiety worsened recently b/c he is 3 weeks post-operatively from ventral hernia repair and wanting to be more active but cannot lift and be active under Dr. Billey Chang direction - Overall abdominal pain is better with hernia repaired - Has not had a recent visit with Dr. Pascal Lux, next visit with December 5th - Buspar 15 mg twice a day and diazepam 5 mg    Review of Systems     Objective:   Physical Exam BP 120/82  Pulse 100  Ht 5\' 10"  (1.778 m)  Wt 167 lb (75.751 kg)  BMI 23.96 kg/m2 Gen: non ill appearing, non distressed, pleasant and conversant Abd: midline incision with mild serous drainage but no evidence of cellulitis  Psych: interactive, normal thought content, speech pattern and behavior, does not appear particularly anxious at this time   GAD-7: 20     Assessment & Plan:

## 2013-06-10 NOTE — Patient Instructions (Signed)
Timothy [name],   It was great to see you. I am glad that you are recovering well. I hope y'all have a safe trip to [place].   Please take the diazepam as needed twice a day with the buspar. Come back and see me after Christmas as long as things are going well. Please come in 2015.   Sincerely,   Dr. [name]

## 2013-06-11 ENCOUNTER — Telehealth (INDEPENDENT_AMBULATORY_CARE_PROVIDER_SITE_OTHER): Payer: Self-pay | Admitting: General Surgery

## 2013-06-11 NOTE — Telephone Encounter (Signed)
Pt's wife called regarding change in drainage from wound.  Pt s/p VHR 2-3 weeks ago.  Dr. Carolynne Edouard just took out staples, and he had some clear drainage from a few sites.  These areas are being packed.  Today, the drainage turned less clear/bloody to "pus."  He was not having any fever, chills, or redness around wound.  He had doxycycline at home.  He was advised to start this and come to urgent office on Monday.   He was also advised to call back or come to ED if he developed any of those symptoms.

## 2013-06-13 NOTE — Assessment & Plan Note (Addendum)
A: pt with cont'd anxiety and no longer working on previous coping strategies that he developed with Dr. Pascal Lux P: - Cont Buspar and diazepam - Encouraged decreased caffeine  - F/u with Dr. Pascal Lux to address strategies for [facility]

## 2013-06-14 ENCOUNTER — Ambulatory Visit (INDEPENDENT_AMBULATORY_CARE_PROVIDER_SITE_OTHER): Payer: Medicaid Other | Admitting: General Surgery

## 2013-06-14 ENCOUNTER — Encounter (INDEPENDENT_AMBULATORY_CARE_PROVIDER_SITE_OTHER): Payer: Self-pay | Admitting: General Surgery

## 2013-06-14 VITALS — BP 144/88 | HR 64 | Temp 97.9°F | Resp 14 | Ht 70.0 in | Wt 171.0 lb

## 2013-06-14 DIAGNOSIS — K439 Ventral hernia without obstruction or gangrene: Secondary | ICD-10-CM

## 2013-06-14 MED ORDER — DOXYCYCLINE HYCLATE 100 MG PO TABS: 100.0000 mg | ORAL_TABLET | Freq: Two times a day (BID) | ORAL | Status: DC

## 2013-06-14 NOTE — Telephone Encounter (Signed)
Called pt and gave appt for this afternoon.

## 2013-06-14 NOTE — Patient Instructions (Signed)
Pack wound twice a day after shower Clean with peroxide and qtip

## 2013-06-14 NOTE — Progress Notes (Signed)
Subjective:     Patient ID: Timothy [name], male   DOB: 08-01-1965, 47 y.o.   MRN: [id number]  HPI The patient is a 47 year old white male who is one month status post large ventral hernia repair with mesh. His postoperative course has been complicated by 2 small areas that opened up along the midline incision. He has had some drainage from both of those. There has been no redness to the skin. There is been no fever. His appetite is good his bowels are working normally. He has not lift anything heavy at home  Review of Systems     Objective:   Physical Exam On exam his abdomen is soft with minimal tenderness. Most of his midline incision is Healing well. He has 2 small openings along the middle and lower portion of the incision. Neither of these track very far. I do not see any exposed mesh.     Assessment:     The patient is one month status post large ventral hernia repair with mesh     Plan:     At this point I think he needs to change the dressing twice a day after a shower. He can also clean both wounds with peroxide. He will travel to [place] for Thanksgiving and I will plan to see him back in one week. He will go ahead and start taking doxycycline since he did have some drainage from the wound

## 2013-06-16 ENCOUNTER — Ambulatory Visit: Payer: Medicaid Other | Admitting: Psychology

## 2013-06-22 ENCOUNTER — Ambulatory Visit (INDEPENDENT_AMBULATORY_CARE_PROVIDER_SITE_OTHER): Payer: Medicaid Other | Admitting: General Surgery

## 2013-06-22 VITALS — BP 142/78 | HR 88 | Temp 97.8°F | Resp 20 | Ht 70.0 in | Wt 172.0 lb

## 2013-06-22 DIAGNOSIS — K439 Ventral hernia without obstruction or gangrene: Secondary | ICD-10-CM

## 2013-06-22 NOTE — Patient Instructions (Signed)
No heavy lifting Clean wound with peroxide

## 2013-06-25 ENCOUNTER — Encounter: Payer: Self-pay | Admitting: Psychology

## 2013-06-25 ENCOUNTER — Ambulatory Visit (INDEPENDENT_AMBULATORY_CARE_PROVIDER_SITE_OTHER): Payer: Medicaid Other | Admitting: Psychology

## 2013-06-25 DIAGNOSIS — F319 Bipolar disorder, unspecified: Secondary | ICD-10-CM

## 2013-06-25 NOTE — Progress Notes (Signed)
Viviann Spare presents for follow-up.  He has had a return of significant pain - mostly in his sides.  He also has two openings in his surgical wound that he is watching closely.  He is not sleeping well.  Went to [facility] to see his Dad over Thanksgiving.  Hospice is scheduled once a week.  Dad was given 4-6 months and he is entering his 4th month.  He and [name] had several talks and his Dad has his affairs in order.    Things remain stressful at home.  He says he and [name] are talking more.  The big stress is the fighting in the house; a lot of yelling and screaming especially between the two kids.  Given the blended family dynamic, this is tough to manage.    [name] is very limited in function right now given he isn't supposed to lift anything.  He does some folding of laundry.  Other than that, he spends the majority of his time watching television.    Smoking about 1.5 packs per day.  "The only thing that calms me down."

## 2013-06-25 NOTE — Progress Notes (Signed)
Subjective:     Patient ID: Timothy [name], male   DOB: 02/04/1966, 47 y.o.   MRN: [id number]  HPI The patient is a 47 year old white male who is about one month status post ventral hernia repair with mesh. His postoperative course has been complicated by 2 small openings along the midline incision. He has had minimal drainage. His appetite is good and his bowels are working normally. He denies any fevers or chills.  Review of Systems     Objective:   Physical Exam On exam his abdomen is soft with minimal tenderness. Most of his midline incision is almost completely healed. Both of the small openings are Very shallow. There is no sign of infection.     Assessment:     The patient is one month status post ventral hernia repair with mesh     Plan:     At this point he will continue to shower daily and keep the 2 small openings clean and covered and packed with gauze. I've asked him to refrain from any heavy lifting or strenuous activity for another 2-3 weeks. I will plan to see him back in about 3 weeks to check his progress.

## 2013-06-25 NOTE — Assessment & Plan Note (Signed)
Multiple factors seem to be having a negative impact on Timothy [name]'s mood.  His dad's terminal illness is the biggest.  While he would like to be in [facility], he thinks he needs to be here and his dad is in agreement with this.  With regards to home situation - discussed the idea that the most helpful point of change is likely HIS reaction to the stress at home.  Unfortunately - he is unable to generate any coping mechanisms other than the ones he is currently using which is to smoke and stay away from people.  Floated a few out there but he has a tough time understanding psychological .  Ended up with two interventions (see patient instructions).

## 2013-06-25 NOTE — Patient Instructions (Signed)
Please schedule a follow up for 12/16 at 9:00.  1) Identify three goals for yourself daily.  They can be as small as they need to be in order to keep you focused on accomplishing something daily.  2) Remember what is giving your life meaning and purpose right now (supporting your dad and step-mom and supporting [employer] Baseman).  Ask yourself daily if you are doing what you need to do to in this regard.  Calling your dad as you are is a great way to support him.

## 2013-06-28 ENCOUNTER — Telehealth: Payer: Self-pay | Admitting: Psychology

## 2013-06-28 NOTE — Telephone Encounter (Signed)
Timothy [name] called to cancel his appointment for the 16th.  He is headed down to [place] because his Dad's health has deteriorated further.  He reportedly is not leaving his bed and is unable to talk.  Timothy [name] is booked for a flight on Sunday and wants to make his appointment with Dr. [name] this coming Thursday (the 11th).  Dr. [name] is not in clinic earlier than Thursday.  I think Timothy [name] may need some refills on medication.  I told him to keep in touch with me or Dr. [name] if he has to leave earlier for that trip.  He feels strongly about being there before his father dies.

## 2013-06-29 ENCOUNTER — Telehealth (INDEPENDENT_AMBULATORY_CARE_PROVIDER_SITE_OTHER): Payer: Self-pay

## 2013-06-29 DIAGNOSIS — Z8719 Personal history of other diseases of the digestive system: Secondary | ICD-10-CM

## 2013-06-29 MED ORDER — OXYCODONE-ACETAMINOPHEN 5-325 MG PO TABS: 1.0000 | ORAL_TABLET | ORAL | Status: DC | PRN

## 2013-06-29 NOTE — Telephone Encounter (Signed)
Called pt to notify him that it's ok for him to fly to [place] per Dr Carolynne Edouard. I have a refill on the Oxycodone 5/325mg  #50 written by Dr Carolynne Edouard at the front desk. The pt will p/u at front desk.

## 2013-06-30 ENCOUNTER — Telehealth: Payer: Self-pay | Admitting: Psychology

## 2013-06-30 NOTE — Telephone Encounter (Signed)
[name] called to let me know his dad died this morning around 9:00.  He spoke to him last night and he sounded good.  He was surprised to get the phone call this morning.  He wonders why his dad didn't hold on until [name] had a chance to get there - this Sunday.  Discussed briefly.  [name] is driving there this afternoon with his wife, two sons and step-daughter.  I asked about safety driving.  He has two other licensed drivers in the car and they plan to trade off.  Will let Dr. [name] know.

## 2013-07-01 ENCOUNTER — Ambulatory Visit: Payer: Medicaid Other | Admitting: Family Medicine

## 2013-07-06 ENCOUNTER — Telehealth: Payer: Self-pay | Admitting: Family Medicine

## 2013-07-06 ENCOUNTER — Ambulatory Visit: Payer: Medicaid Other | Admitting: Psychology

## 2013-07-06 NOTE — Telephone Encounter (Signed)
Pt calling to stay he is having abdominal pain that is severe, and he thinks that it is related to constipation. His bowel movements have been slowed while in [place]. He is not having hematochezia, mucous in his stool, or vomiting. He has had chills but no fever. The wound is not red and draining only clear fluid, which has been consistent for months. I expressed concern about possible infection which needs further evaluation. He is hesitant to go to the hospital in [place] b/c no one knows him and his case and pt also does not want to miss his father's funeral tomorrow. I recommended going to the ED if he developed fever, bloody stools, vomiting, or worsening pain. He was agreeable with this plan.

## 2013-07-07 ENCOUNTER — Other Ambulatory Visit (INDEPENDENT_AMBULATORY_CARE_PROVIDER_SITE_OTHER): Payer: Self-pay | Admitting: General Surgery

## 2013-07-07 ENCOUNTER — Telehealth (INDEPENDENT_AMBULATORY_CARE_PROVIDER_SITE_OTHER): Payer: Self-pay | Admitting: *Deleted

## 2013-07-07 DIAGNOSIS — K439 Ventral hernia without obstruction or gangrene: Secondary | ICD-10-CM

## 2013-07-07 NOTE — Telephone Encounter (Signed)
Patient called to report that he is having lower abdominal pain that the pain medication is not helping with and he is unable to stand up straight due to the pain.  Patient also reports a fever of 100F last night.  Patient is currently in [place] due to fathers funeral.  Patient had called last night and spoke to Dr. Daphine Deutscher (on-call) who suggested he go to local ED however patient states he didn't want to go somewhere out of state.  Patient states he will be coming back into town tomorrow morning and asking what he should do.  Explained that I would send a message to Dr. Carolynne Edouard to ask his opinion on this and then we can let him know.  Patient states understanding and agreeable at this time.

## 2013-07-07 NOTE — Telephone Encounter (Signed)
Dr Carolynne Edouard ordered CT. Patient aware we will call him with that appointment hopefully for Friday.

## 2013-07-08 ENCOUNTER — Telehealth (INDEPENDENT_AMBULATORY_CARE_PROVIDER_SITE_OTHER): Payer: Self-pay | Admitting: *Deleted

## 2013-07-08 ENCOUNTER — Ambulatory Visit (INDEPENDENT_AMBULATORY_CARE_PROVIDER_SITE_OTHER): Payer: Medicaid Other | Admitting: Surgery

## 2013-07-08 ENCOUNTER — Encounter (INDEPENDENT_AMBULATORY_CARE_PROVIDER_SITE_OTHER): Payer: Self-pay

## 2013-07-08 ENCOUNTER — Encounter (INDEPENDENT_AMBULATORY_CARE_PROVIDER_SITE_OTHER): Payer: Self-pay | Admitting: Surgery

## 2013-07-08 VITALS — BP 143/86 | HR 68 | Temp 97.3°F | Resp 16 | Ht 70.0 in | Wt 171.6 lb

## 2013-07-08 DIAGNOSIS — Z9889 Other specified postprocedural states: Secondary | ICD-10-CM

## 2013-07-08 DIAGNOSIS — Z8719 Personal history of other diseases of the digestive system: Secondary | ICD-10-CM

## 2013-07-08 MED ORDER — OXYCODONE-ACETAMINOPHEN 5-325 MG PO TABS: 2.0000 | ORAL_TABLET | ORAL | Status: DC | PRN

## 2013-07-08 MED ORDER — DOXYCYCLINE HYCLATE 50 MG PO CAPS: 100.0000 mg | ORAL_CAPSULE | Freq: Two times a day (BID) | ORAL | Status: DC

## 2013-07-08 NOTE — Patient Instructions (Signed)
CT in am.  Start antibiotics.  More instructions once scan done.

## 2013-07-08 NOTE — Telephone Encounter (Signed)
Timothy [name] called in stating that he has just got home from [place] and was changing his packing in his abdominal wound.  He states that when he removed the packing the wound began "gushing" with blood down his legs.  He then cleaned the wound and re-packed which since then has stopped bleeding.  He states the packing that was removed was green and it has not been like that before.  Denies any odor.  He is having moderate pain in his lower abdomen as well as some swelling at his pubic area.  His temp is currently 99.0.  I made Timothy [name] an appt with Dr. Luisa Hart in urgent office this afternoon.  Timothy [name] is agreeable with this plan.

## 2013-07-08 NOTE — Progress Notes (Signed)
Patient presents with chief complaint of abdominal pain. He is 7 weeks out from an open ventral hernia repair by Dr. Carolynne Edouard. He complains of umbilical and bilateral lower quadrant pain. States he had a fever at home but has no fever here today. He's had 2 open areas in his incision had been opened by Dr. Carolynne Edouard and were quite superficial. He presents urgent office for increasing abdominal pain. He is scheduled for a CT scan tomorrow morning. His bowels are moving. He is tolerating his diet.  Exam: Midline incision healed except for one open area measuring a centimeter near the umbilicus. Q-tip goes in about a centimeter. The redness drainage noted. No redness. Fullness over pubis noted without redness. Tender bilateral lower quadrants without peritonitis  Impression: Status post open incisional hernia repair with postoperative pain  Plan: CT scan a.m. He did not look toxic to me. Refill Percocet for now. Started doxycycline 100 mg by mouth twice a day. Further care to be determined once the CT scan is done.

## 2013-07-09 ENCOUNTER — Ambulatory Visit
Admission: RE | Admit: 2013-07-09 | Discharge: 2013-07-09 | Disposition: A | Discharge status: Home / Self Care | Payer: Medicaid Other | Source: Ambulatory Visit | Attending: General Surgery | Admitting: General Surgery

## 2013-07-09 ENCOUNTER — Telehealth: Payer: Self-pay | Admitting: Family Medicine

## 2013-07-09 DIAGNOSIS — K439 Ventral hernia without obstruction or gangrene: Secondary | ICD-10-CM

## 2013-07-09 MED ORDER — IOHEXOL 300 MG/ML  SOLN
100.0000 mL | Freq: Once | INTRAMUSCULAR | Status: AC | PRN
  Administered 2013-07-09: 100 mL via INTRAVENOUS

## 2013-07-11 NOTE — Telephone Encounter (Signed)
Pt calling re abdominal pain and wants to know results of abdominal CT. This was reviewed today with the pt, and he had no further questions.

## 2013-07-12 ENCOUNTER — Telehealth (INDEPENDENT_AMBULATORY_CARE_PROVIDER_SITE_OTHER): Payer: Self-pay | Admitting: *Deleted

## 2013-07-12 NOTE — Telephone Encounter (Signed)
Patient called asking about the Doxycycline.  Patient states he was not told about taking it.  Per Dr. Rosezena Sensor office visit notes on 07/08/13 it does state that he is prescribing it for the patient to take.  Patient asked about his CT scan.  Explained that the CT scan looks fine and didn't show the reason for his symptoms.  Patient is going to go ahead and take the Doxycycline since Dr. Luisa Hart had prescribed it for him.  Patient has no further questions and understands POC at this time.

## 2013-07-14 ENCOUNTER — Encounter: Payer: Self-pay | Admitting: Family Medicine

## 2013-07-14 ENCOUNTER — Ambulatory Visit (INDEPENDENT_AMBULATORY_CARE_PROVIDER_SITE_OTHER): Payer: Medicaid Other | Admitting: Family Medicine

## 2013-07-14 VITALS — BP 129/86 | HR 99 | Temp 97.8°F | Ht 70.0 in | Wt 172.0 lb

## 2013-07-14 DIAGNOSIS — M62838 Other muscle spasm: Secondary | ICD-10-CM

## 2013-07-14 MED ORDER — CYCLOBENZAPRINE HCL 10 MG PO TABS: 10.0000 mg | ORAL_TABLET | Freq: Three times a day (TID) | ORAL | Status: DC | PRN

## 2013-07-14 NOTE — Patient Instructions (Addendum)
It was great to see you buddy. I am sorry about the abdominal pain. We should try a muscle relaxer to see if this helps. Take 1/2 tablet this afternoon and if needed another before bed. Do not take these before driving, because it can sedate you. Overall, the incision looks good and abdominal wall feels strong.   Merry Christmas!   Come back in 4 weeks.   Sincerely,   Dr. [name]

## 2013-07-14 NOTE — Progress Notes (Signed)
   Subjective:    Patient ID: Timothy [name], male    DOB: August 07, 1965, 47 y.o.   MRN: [id number]  HPI  47 year old M with chronic abdominal pain who is s/p ventral hernia repair on 05/20/13. He has had no complications except constipation and severe pain. He had a CT scan last week that did not show any evidence of abscesses. He presents today for evaluation of the pain. He pain is located on his lateral flank and superior pubic area. He describes it as a pulling pain. No nausea, vomiting, or fevers.    Current Outpatient Prescriptions on File Prior to Visit  Medication Sig Dispense Refill  . ARIPiprazole (ABILIFY) 5 MG tablet Take 5 mg by mouth every morning.      . busPIRone (BUSPAR) 15 MG tablet Take 15 mg by mouth 4 (four) times daily.      . diazepam (VALIUM) 5 MG tablet Take 1 tablet (5 mg total) by mouth 2 (two) times daily as needed for anxiety.  60 tablet  2  . doxycycline (VIBRAMYCIN) 50 MG capsule Take 2 capsules (100 mg total) by mouth 2 (two) times daily.  20 capsule  0  . lisinopril (PRINIVIL,ZESTRIL) 10 MG tablet Take 20 mg by mouth daily.      . ondansetron (ZOFRAN ODT) 4 MG disintegrating tablet Take 1 tablet (4 mg total) by mouth every 8 (eight) hours as needed for nausea.  30 tablet  3  . oxyCODONE-acetaminophen (ROXICET) 5-325 MG per tablet Take 2 tablets by mouth every 4 (four) hours as needed for severe pain.  30 tablet  0  . polyethylene glycol (MIRALAX / GLYCOLAX) packet Take 17 g by mouth daily.      . varenicline (CHANTIX) 1 MG tablet Take 1 mg by mouth 2 (two) times daily.       No current facility-administered medications on file prior to visit.    Review of Systems See HPI    Objective:   Physical Exam BP 129/86  Pulse 99  Temp(Src) 97.8 F (36.6 C) (Oral)  Ht 5\' 10"  (1.778 m)  Wt 172 lb (78.019 kg)  BMI 24.68 kg/m2 Gen: pt in mild distress but non ill appearing Abd: well healing midline incision with small opening and serious drainage, no redness or  tenderness, pt with lots of difficulty going from seated to lying and visa versa     Assessment & Plan:  Post operative pain 2 months s/p ventral hernia repair. Concerning for muscle spasms complicating pain. Will try flexeril since pt taking opiates without relief. I told him that I would not prescribe opiates since this is a post-operative issues for his surgeon to address. Pt agreeable with this plan.

## 2013-07-16 ENCOUNTER — Encounter (INDEPENDENT_AMBULATORY_CARE_PROVIDER_SITE_OTHER): Payer: Self-pay | Admitting: General Surgery

## 2013-07-16 ENCOUNTER — Ambulatory Visit (INDEPENDENT_AMBULATORY_CARE_PROVIDER_SITE_OTHER): Payer: Medicaid Other | Admitting: General Surgery

## 2013-07-16 VITALS — BP 124/80 | HR 76 | Temp 98.1°F | Resp 14 | Ht 70.0 in | Wt 174.8 lb

## 2013-07-16 DIAGNOSIS — K439 Ventral hernia without obstruction or gangrene: Secondary | ICD-10-CM

## 2013-07-16 NOTE — Progress Notes (Signed)
Subjective:     Patient ID: Timothy [name], male   DOB: 1966-01-06, 47 y.o.   MRN: [id number]  HPI The patient is a 47 year old white male who is about 2 months status post large ventral hernia repair with mesh. He recently went to [place] for his father sphenoidal and wall down there developed worsening of his lower abdominal pain. He states he had some low-grade fevers. He did not want to go to the emergency department down manner. He waited until he was back in and we obtained a CT scan of his abdomen and pelvis. The CT scan was reviewed and showed only normal postoperative changes but no fluid collections  Review of Systems     Objective:   Physical Exam On exam his abdomen is soft. He has some tenderness along the lower quadrants. There is no redness or cellulitis. The one small opening to the midportion of the wound is very clean with good granulation tissue. There is no drainage from this area.    Assessment:     The patient is 2 months status post large ventral hernia repair with mesh     Plan:     At this point I do not appreciate any significant complication from his surgery other than the small open area to the midportion of the wound. I suspect she will gradually improve but I think he will declare himself a time either he will get better or he will get worse and we will Be able to see the problem. I will plan to see him back in one month to check his progress.

## 2013-07-16 NOTE — Patient Instructions (Signed)
Eat protein. Take multivitamin with zinc and Vit C Shower and pack wound daily

## 2013-07-19 ENCOUNTER — Ambulatory Visit (INDEPENDENT_AMBULATORY_CARE_PROVIDER_SITE_OTHER): Payer: Medicaid Other | Admitting: Psychology

## 2013-07-19 DIAGNOSIS — F319 Bipolar disorder, unspecified: Secondary | ICD-10-CM

## 2013-07-19 NOTE — Progress Notes (Signed)
Timothy [name] presents for follow-up.  His Dad died 2023-07-29 (I think) and he went to [place] for a week for the service.  He has been back nearly two weeks.  Mornings are the toughest - when everyone is asleep and he is thinking about his dad.  Dad was also his best friend.  Discussed at length.  Things have settled down with [name] - less yelling.  Kids are still at each other a lot which can be stressful.    Surgeon said that his stomach is about as strong as he should expect it to get so okay to increase activity as tolerated.  He had some significant pain while in [place] and still has some difficulty.  Sleeps in a recliner - can't sleep lying flat.

## 2013-07-19 NOTE — Assessment & Plan Note (Addendum)
Report of mood is irritable and sad.  He is tearful today on several occasions.  Based on his report today, would assess him to be reacting to his Dad's death in an appropriate way.  Discussed what to expect moving forward.  He believes that he is going to have a "very hard time."  Reframed sadness and feelings of loss / grief as normal.  Discussed gratitude and continuing to "talk" to his father as potential coping mechanisms.  He has barriers to reading, journaling and exercising.  He is likely best when he is focused on an activity.  Will continue to review potential coping.    Need to review drinking and smoking next visit.    50 minutes of face to face counseling.

## 2013-07-23 ENCOUNTER — Telehealth: Payer: Self-pay | Admitting: Family Medicine

## 2013-07-23 NOTE — Telephone Encounter (Signed)
Patient reports he has a chest cold for 3 days, using inhaler from prior URI and it is helping. Patient has temperature of 99.0. Endorses congestion, mild sore throat, headache, cough and ear pain. Denies diarrhea, abdominal pain or muscle/body aches. Eating and drinking ok, with occasional vomit in evening times. Wife recently sick and had to have Z-pack and cough syrup. Patient is wanting Z-pack called in.  P:  Explained to patient this sounds like a virus. Encourage him to treat symptomatically for now, with tylenol for headache and fever, mucinex for congestion/drainage and drinking plenty of fluids to stay hydrated. Explained natural course of a virus and that he might not feel better for 3-5 more days. Encouraged him to come to the ED if he becomes worse over the weekend, unable to hold down fluids or SOB/Cp. Recommended he make an appointment to be seen this Monday in walk in clinic. Patient states he believed he would be able to wait to Monday.  Latrese Carolan DO

## 2013-07-26 ENCOUNTER — Telehealth (INDEPENDENT_AMBULATORY_CARE_PROVIDER_SITE_OTHER): Payer: Self-pay

## 2013-07-26 ENCOUNTER — Encounter: Payer: Self-pay | Admitting: Family Medicine

## 2013-07-26 ENCOUNTER — Ambulatory Visit (INDEPENDENT_AMBULATORY_CARE_PROVIDER_SITE_OTHER): Payer: Medicaid Other | Admitting: Family Medicine

## 2013-07-26 ENCOUNTER — Telehealth: Payer: Self-pay | Admitting: Family Medicine

## 2013-07-26 VITALS — BP 122/88 | Temp 97.7°F | Ht 70.0 in | Wt 175.0 lb

## 2013-07-26 DIAGNOSIS — R0602 Shortness of breath: Secondary | ICD-10-CM

## 2013-07-26 DIAGNOSIS — Z8719 Personal history of other diseases of the digestive system: Secondary | ICD-10-CM

## 2013-07-26 DIAGNOSIS — R05 Cough: Secondary | ICD-10-CM

## 2013-07-26 DIAGNOSIS — J209 Acute bronchitis, unspecified: Secondary | ICD-10-CM | POA: Insufficient documentation

## 2013-07-26 DIAGNOSIS — R059 Cough, unspecified: Secondary | ICD-10-CM

## 2013-07-26 DIAGNOSIS — J441 Chronic obstructive pulmonary disease with (acute) exacerbation: Secondary | ICD-10-CM

## 2013-07-26 DIAGNOSIS — F17200 Nicotine dependence, unspecified, uncomplicated: Secondary | ICD-10-CM

## 2013-07-26 DIAGNOSIS — Z720 Tobacco use: Secondary | ICD-10-CM

## 2013-07-26 DIAGNOSIS — Z9889 Other specified postprocedural states: Principal | ICD-10-CM

## 2013-07-26 MED ORDER — ALBUTEROL SULFATE HFA 108 (90 BASE) MCG/ACT IN AERS: 2.0000 | INHALATION_SPRAY | Freq: Four times a day (QID) | RESPIRATORY_TRACT | Status: DC | PRN

## 2013-07-26 MED ORDER — HYDROCODONE-HOMATROPINE 5-1.5 MG/5ML PO SYRP: 5.0000 mL | ORAL_SOLUTION | Freq: Three times a day (TID) | ORAL | Status: DC | PRN

## 2013-07-26 MED ORDER — AZITHROMYCIN 250 MG PO TABS: ORAL_TABLET | ORAL | Status: DC

## 2013-07-26 MED ORDER — VARENICLINE TARTRATE 1 MG PO TABS: 1.0000 mg | ORAL_TABLET | Freq: Two times a day (BID) | ORAL | Status: DC

## 2013-07-26 MED ORDER — PREDNISONE 20 MG PO TABS: 40.0000 mg | ORAL_TABLET | Freq: Every day | ORAL | Status: DC

## 2013-07-26 NOTE — Telephone Encounter (Signed)
I am ok with 20 percocet if someone there can write it.

## 2013-07-26 NOTE — Assessment & Plan Note (Addendum)
A: pt counseled on need to quit  P: - Start chantix back at 1 gm BID since well tolerated and helpful last time - Obtain PFT's when resp infection healed

## 2013-07-26 NOTE — Telephone Encounter (Signed)
The pt called to request more Percocet.  He has flu and has been coughing, vomiting, has sore throat and earache.  He is going to see his medical Dr today around 1:45 and wants to come by and pick up a prescription.  Please let him know when ready.

## 2013-07-26 NOTE — Assessment & Plan Note (Signed)
A: pt with worsening respiratory symptoms with known emphysema in setting of acute bronchitis, which is consistent with COPD exacerbation P:  - Start azithromycin for 5 days - Prednisone 5 days - Albuterol q 2 PRN - If worsening in 48 hours, obtain CXR(pt in hospital within last 90 days) - PFT's in 2 weeks - Hycodan for cough

## 2013-07-26 NOTE — Patient Instructions (Signed)
Dear Timothy [name],   I think that you have bronchitis which is caused by a virus, but since you have emphysema, we need to treat you more aggressively.   1. Take azithromycin for 5 days 2. Take prednisone 40 mg for 5 days 3. Use albuterol every 2-4 hours as needed 4. Cough syrup as needed.  5. Start Chantix in 5 days if you are feeling better.   Let me know if you are not better in 48 hours. I would like to schedule an appointment with Dr. Valentina Lucks for breathing tests in 2 weeks.   Take Care,   Dr. Maricela Bo

## 2013-07-26 NOTE — Progress Notes (Signed)
   Subjective:    Patient ID: Timothy [name], male    DOB: Jan 14, 1966, 48 y.o.   MRN: [id number]  HPI  48 year old M with bipolar disorder, tobacco abuse, and chronic abdominal pain s/p ventral hernia repair in October 2014. He presents to with with concerns of shortness of breath and needing a refill on chantix.   Shortness of breath - Productive cough with brown phlegm, SOB, "feel like shit" including sore throat, and right ear pain; Nausea and vomiting for three days last wee; denies diarrhea or constipation; Respiratory symptoms started 6 days ago and improved, but then worsened again in last 24 hours; Pt has tried delsym, ibuprofen, and albuterol without relief; no fever or chills  Tobacco Abuse - pt smoking 2 ppd, > 40 pack years, recent CT scan abdomen showed evidence of emphysema, no PFTs; was previously taking Chantix with success but has not taken in last month    Review of Systems     Objective:   Physical Exam BP 122/88  Temp(Src) 97.7 F (36.5 C) (Oral)  Ht 5\' 10"  (1.778 m)  Wt 175 lb (79.379 kg)  BMI 25.11 kg/m2  SpO2 97%  Gen: ill appearing, non toxic HEENT: NCAT, PERRLA, EOMI, OP clear and moist, no lymphadenopathy, no thyroid tenderness, enlargement, or nodules CV: sinus tachycardia Pulm: increased WOB, mild expiratory wheezes on right side, no consolidation on percussion        Assessment & Plan:  COPD exacerbation + bronchitis

## 2013-07-27 MED ORDER — OXYCODONE-ACETAMINOPHEN 5-325 MG PO TABS: 2.0000 | ORAL_TABLET | ORAL | Status: DC | PRN

## 2013-07-27 NOTE — Addendum Note (Signed)
Addended by: Carlene Coria on: 07/27/2013 09:25 AM   Modules accepted: Orders

## 2013-07-27 NOTE — Telephone Encounter (Signed)
Pt called and told script up front for pick up.

## 2013-08-05 ENCOUNTER — Ambulatory Visit (INDEPENDENT_AMBULATORY_CARE_PROVIDER_SITE_OTHER): Payer: Medicaid Other | Admitting: Psychology

## 2013-08-05 ENCOUNTER — Encounter: Payer: Self-pay | Admitting: Psychology

## 2013-08-05 DIAGNOSIS — F17200 Nicotine dependence, unspecified, uncomplicated: Secondary | ICD-10-CM

## 2013-08-05 DIAGNOSIS — F319 Bipolar disorder, unspecified: Secondary | ICD-10-CM

## 2013-08-05 DIAGNOSIS — F4321 Adjustment disorder with depressed mood: Secondary | ICD-10-CM | POA: Insufficient documentation

## 2013-08-05 DIAGNOSIS — Z720 Tobacco use: Secondary | ICD-10-CM

## 2013-08-05 NOTE — Assessment & Plan Note (Signed)
Grief is definitely present in the context of underlying mood issues and multiple medical comorbidities.  His thought that he is "not yet over" his father's death was challenged and his grief was normalized.  Will revisit.

## 2013-08-05 NOTE — Progress Notes (Signed)
Timothy [name] presents for follow-up.  He is attempting to quit smoking with the help of Chantix.  3 cigarettes yesterday but already 3 this morning secondary to increased stress at home.  Discussed.    Considering moving into his mom's house in [place] but upon further questioning, thinks that the living situation is not as significant as what he brings to the table in terms of mood.  Mostly irritable and "unhappy" (denies sadness per se).  Some numbness.    Does not think he is "over" grieving his father.  Discussed.

## 2013-08-05 NOTE — Assessment & Plan Note (Signed)
Spent bulk of our time here today.  Concerned about PFTs on Monday.  MI scaling - 8 on importance, 5 on confidence.  He doesn't really want to quit smoking but feels like he should.  Discussed this and attempted to motivate around a more active approach to smoking cessation:  1) Smoking diary wrapped around his cigarettes to create a pause that allows him to consider how badly he wants a cigarette. 2) Daily list of reasons he WANTS to quit TODAY.  What would be different if he weren't smoking.  Identified that if he saved a considerable amount of money, he would be interested in rewarding himself with a new fishing pole. 3) Keeping a tally sheet of number of cigarettes smoked in a public place so that his family members might get involved.  He did not like the 3rd idea but seemed interested in 1 and 2.  Will continue to monitor.  He wanted to see me shortly after his PFTs because he anticipates bad news.  Also of note - he caught his son smoking.

## 2013-08-05 NOTE — Assessment & Plan Note (Signed)
Likely not well managed with various confounding variables.  His attendance at appointments is good however his ability to enact any strategies outside of the therapy room has been poor.  Several barriers identified - many of them are not likely to change.  Will continue to provide support.  Might be worthwhile to revisit medication adherence and thoughts about efficacy.

## 2013-08-09 ENCOUNTER — Ambulatory Visit (INDEPENDENT_AMBULATORY_CARE_PROVIDER_SITE_OTHER): Payer: Medicaid Other | Admitting: General Surgery

## 2013-08-09 ENCOUNTER — Ambulatory Visit (INDEPENDENT_AMBULATORY_CARE_PROVIDER_SITE_OTHER): Payer: Medicaid Other | Admitting: Pharmacist

## 2013-08-09 ENCOUNTER — Encounter: Payer: Self-pay | Admitting: Pharmacist

## 2013-08-09 ENCOUNTER — Encounter (INDEPENDENT_AMBULATORY_CARE_PROVIDER_SITE_OTHER): Payer: Self-pay | Admitting: General Surgery

## 2013-08-09 VITALS — BP 150/90 | HR 88 | Temp 97.6°F | Resp 16 | Ht 70.0 in | Wt 174.8 lb

## 2013-08-09 VITALS — BP 168/101 | HR 98 | Ht 70.0 in | Wt 175.8 lb

## 2013-08-09 DIAGNOSIS — Z09 Encounter for follow-up examination after completed treatment for conditions other than malignant neoplasm: Secondary | ICD-10-CM

## 2013-08-09 DIAGNOSIS — F17200 Nicotine dependence, unspecified, uncomplicated: Secondary | ICD-10-CM

## 2013-08-09 DIAGNOSIS — J441 Chronic obstructive pulmonary disease with (acute) exacerbation: Secondary | ICD-10-CM

## 2013-08-09 DIAGNOSIS — Z720 Tobacco use: Secondary | ICD-10-CM

## 2013-08-09 NOTE — Progress Notes (Signed)
Patient ID: Timothy [name], male   DOB: 03/27/1966, 48 y.o.   MRN: [id number] REviewed: Agree with Dr. Graylin Shiver documentation and management.

## 2013-08-09 NOTE — Patient Instructions (Signed)
Do not pick at area Keep area clean with mild soap and water  Cover with dry gauze

## 2013-08-09 NOTE — Progress Notes (Signed)
S:    Patient arrives in good spirits with slight nervousness and verbalizes concern of lung function.    Presents for lung function evaluation after multiple exacerbations.   He reports feeling 70% of his normal breathing at this time.  Patient reports breathing has been limiting in his activity to some extent.  He reports some wheezing and uses his albuterol 5-6 times daily.    Age when started using tobacco on a daily basis 17. Number of Cigarettes per day 3-20 - previously 2 PPD. Longest time ever been tobacco free 3 months following 18 day hospitalization What Medications (NRT, bupropion, varenicline) used in past includes varenicline (currently taking).  Rates IMPORTANCE of quitting tobacco on 1-10 scale of 8. Rates CONFIDENCE of quitting tobacco on 1-10 scale of 5. Triggers to use tobacco include; boredom and stress.    O:  See "scanned report" or Documentation Flowsheet (discrete results - PFTs) for  Spirometry results. Patient provided good effort while attempting spirometry.   Lung Age = 62  A/P: Spirometry evaluation with Pre and Post Bronchodilator reveals normal lung function.  However, he was identified as having the lung age of a 48 year old patient and has 24% chance of COPD if he continues to smoke and 10% if he stops smoking. Patient has been experiencing difficulty breathing with recent lung exacerbations requiring use of albuterol.  no change in respiratory therap at this time.  Reviewed results of pulmonary function tests.  Pt verbalized understanding of results and education.     Continue varenicline tx patient on 1mg  BID. Goal is to smoke 10 cigarettes or less until the end of the month.  New target short-term goal is to set quit date at next visit with Dr. Maricela Bo 2/2.   Written pt instructions provided.  F/U Clinic visit with Drs. Gwenlyn Saran and Maricela Bo.   Total time in face to face counseling 25 minutes.  Patient seen with Kathi Der, PharmD Candidate.

## 2013-08-09 NOTE — Patient Instructions (Signed)
It was great seeing you today!  The test results demonstrated has appearing as a normal spirometry reading.  The test did interpret your lung function as someone who is 48 years old.   Follow up with Dr. Gwenlyn Saran and Dr. Maricela Bo

## 2013-08-09 NOTE — Progress Notes (Signed)
Subjective:     Patient ID: Timothy [name], male   DOB: [birth year], 48 y.o.   MRN: [id number]  HPI 47 year old Caucasian male with COPD and tobacco abuse status post open ventral hernia repair with mesh in November by Dr. Marlou Starks comes in because of concerns regarding his incision. He states that after surgery he had an area opened up and he was able to pull out the suture that was 6 inches long. He states that he noticed an area of skin thinning with a little bit of drainage a few days ago. He denies any fever, chills, nausea, vomiting, diarrhea or constipation. There is been little if any drainage.  Review of Systems     Objective:   Physical Exam BP 150/90  Pulse 88  Temp(Src) 97.6 F (36.4 C) (Temporal)  Resp 16  Ht 5\' 10"  (1.778 m)  Wt 174 lb 12.8 oz (79.289 kg)  BMI 25.08 kg/m2 Alert, nad Soft, nd, nt. Well healed midline scar. Scar is firm. A little bit below umbilicus in scar there is an area of around 59mm where the scar is denuded. No cellulitis, no exposed suture, no fluctance. No obvious hernia recurrence    Assessment:     Status post open ventral hernia repair with mesh Tobacco abuse COPD     Plan:     He has a very tiny area of where the scar is denuded. I cannot appreciate anything protruding. It is mainly just a raw skin surface of about 2 mm. There is no cellulitis. There is no fluctuance. There is no sign of infection. I just recommended keeping the dry gauze over the area. The patient is still wearing his abdominal binder. He already has an appointment to see Dr. Marlou Starks next week which I encouraged him to keep. We rediscussed the importance of smoking cessation  Leighton Ruff. Redmond Pulling, MD, FACS General, Bariatric, & Minimally Invasive Surgery [facility], [place]

## 2013-08-09 NOTE — Assessment & Plan Note (Signed)
Continue varenicline tx patient on 1mg  BID. Goal is to smoke 10 cigarettes or less until the end of the month.  New target short-term goal is to set quit date at next visit with Dr. Maricela Bo 2/2.

## 2013-08-09 NOTE — Assessment & Plan Note (Addendum)
Spirometry evaluation with Pre and Post Bronchodilator reveals normal lung function.  However, he was identified as having the lung age of a 48 year old patient and has 24% chance of COPD if he continues to smoke and 10% if he stops smoking. Patient has been experiencing difficulty breathing with recent lung exacerbations requiring use of albuterol.  no change in respiratory therap at this time.  Reviewed results of pulmonary function tests.  Pt verbalized understanding of results and education.    Continue varenicline tx, patient on 1mg  BID. Goal is to smoke 10 cigarettes or less until the end of the month.  New target short-term goal is to set quit date at next visit with Dr. Maricela Bo 2/2.   Consider PFT/Lung reevaluation after patient is smoke free and off inhalation therapies.  Patient may gain positive reinforcement and we likely will get a more accurate assessment of lung function secondary to long-term damage.   Written pt instructions provided.  F/U Clinic visit with Drs. Gwenlyn Saran and Maricela Bo.   Total time in face to face counseling 25 minutes.  Patient seen with Kathi Der, PharmD Candidate.

## 2013-08-12 ENCOUNTER — Ambulatory Visit (INDEPENDENT_AMBULATORY_CARE_PROVIDER_SITE_OTHER): Payer: Medicaid Other | Admitting: Psychology

## 2013-08-12 DIAGNOSIS — F319 Bipolar disorder, unspecified: Secondary | ICD-10-CM

## 2013-08-12 NOTE — Assessment & Plan Note (Addendum)
Report of mood is irritable.  Mood is not well controlled.  Need to consider getting him back into [facility].  He does not appear to be a good match for CBT approaches.  An interpersonal approach has been somewhat helpful but does not appear to be translating to his relationships outside of the therapy room.  The family dynamics are long-standing and it does not appear that [name] or his wife are motivated in trying to change them.    While there is a chance that a move out of the house will shift his mood a little, I suspect that he will retain a sense of irritability until he is able to be more financially independent.    On Abilify from [facility].  Has been tried on Depakote, lexapro and prozac in the past.

## 2013-08-12 NOTE — Progress Notes (Signed)
Timothy [name] presents for follow-up.  Discussed lung function tests he had and what they mean to him.  He was able to recite his plan to cut back to no more than 10 cigarettes a day in two weeks and then a quit date two weeks after that.  He reported marked variability in his smoking from four cigarettes to nearly a pack in one day.  He does not have a specific plan other than to be cut back to 10 or less in two weeks.  He has not employed any of the strategies we discussed last time.  Home situation continues to deteriorate.  They had a family meeting and Timothy [name] reported she wants Timothy [name] and Timothy [name] out - preferably that night.  Timothy [name] plans on leaving but does not know how long it will take.  He has options but none of them are great.  Feels like Timothy [name] is kicking him when he is down (despite the fact that he agrees they need to live apart).  He is especially missing his father as this is a situation he would have discussed and his dad would have supported him.

## 2013-08-17 ENCOUNTER — Ambulatory Visit (INDEPENDENT_AMBULATORY_CARE_PROVIDER_SITE_OTHER): Payer: Medicaid Other | Admitting: General Surgery

## 2013-08-17 ENCOUNTER — Encounter (INDEPENDENT_AMBULATORY_CARE_PROVIDER_SITE_OTHER): Payer: Self-pay | Admitting: General Surgery

## 2013-08-17 VITALS — BP 160/100 | HR 80 | Temp 98.9°F | Resp 14 | Ht 70.0 in | Wt 178.2 lb

## 2013-08-17 DIAGNOSIS — K439 Ventral hernia without obstruction or gangrene: Secondary | ICD-10-CM

## 2013-08-17 NOTE — Patient Instructions (Signed)
May begin exercising and returning to normal activities

## 2013-08-17 NOTE — Progress Notes (Signed)
Subjective:     Patient ID: Timothy [name], male   DOB: 1966/02/28, 48 y.o.   MRN: [id number]  HPI The patient is about 3 months status post large ventral hernia repair with mesh. His postoperative course has been complicated by a small open area along the midportion of his wound. This is now healed. He continues to have some soreness laterally on both sides of his abdomen. His appetite is good and his bowels are working normally. He feels very weak and just doesn't have a lot of strength since this has been an ongoing process for the last couple years.  Review of Systems     Objective:   Physical Exam On exam his abdomen is soft with minimal tenderness. His incision has healed nicely with no sign of infection. His abdominal wall feels very solid with no palpable evidence of recurrence of the hernia    Assessment:     The patient is 3 months status post large ventral hernia repair with mesh     Plan:     At this point he should return to his normal activities without restriction. He will call us if he has any problems

## 2013-08-23 ENCOUNTER — Encounter: Payer: Self-pay | Admitting: Family Medicine

## 2013-08-23 ENCOUNTER — Ambulatory Visit (INDEPENDENT_AMBULATORY_CARE_PROVIDER_SITE_OTHER): Payer: Medicaid Other | Admitting: Family Medicine

## 2013-08-23 VITALS — BP 151/97 | HR 97 | Temp 97.8°F | Ht 70.0 in | Wt 180.2 lb

## 2013-08-23 DIAGNOSIS — R1011 Right upper quadrant pain: Secondary | ICD-10-CM

## 2013-08-23 DIAGNOSIS — K921 Melena: Secondary | ICD-10-CM

## 2013-08-23 DIAGNOSIS — R1013 Epigastric pain: Secondary | ICD-10-CM

## 2013-08-23 DIAGNOSIS — G8929 Other chronic pain: Secondary | ICD-10-CM

## 2013-08-23 LAB — CBC
HCT: 34.2 % — ABNORMAL LOW (ref 39.0–52.0)
Hemoglobin: 11.2 g/dL — ABNORMAL LOW (ref 13.0–17.0)
MCH: 27 pg (ref 26.0–34.0)
MCHC: 32.7 g/dL (ref 30.0–36.0)
MCV: 82.4 fL (ref 78.0–100.0)
PLATELETS: 464 10*3/uL — AB (ref 150–400)
RBC: 4.15 MIL/uL — AB (ref 4.22–5.81)
RDW: 16.3 % — ABNORMAL HIGH (ref 11.5–15.5)
WBC: 8.3 10*3/uL (ref 4.0–10.5)

## 2013-08-23 MED ORDER — OXYCODONE-ACETAMINOPHEN 10-325 MG PO TABS: 1.0000 | ORAL_TABLET | Freq: Three times a day (TID) | ORAL | Status: DC | PRN

## 2013-08-23 NOTE — Assessment & Plan Note (Signed)
A: pt with self-reported and negative fecal occult today, no evidence of hemorrhoids or fissures on exam P: - instructed pt to cal surgeon regarding colonoscopy - check hgb today to make sure stable

## 2013-08-23 NOTE — Assessment & Plan Note (Addendum)
A: pt with persistent abdominal pain > 3 mos post operative, but with improved move and functional status P: cont encouraging rehab and exercise, given rx for norco 10-325 mg PO # 60 to be taken PRN but not for daily chronic use

## 2013-08-23 NOTE — Progress Notes (Signed)
   Subjective:    Patient ID: Timothy [name], male    DOB: [birth year], 48 y.o.   MRN: [id number]  HPI  48 year old M with bipolar disorder, tobacco abuse, and chronic abdominal pain s/p ventral hernia repair in October 2014. He had minor wound healing complications, but this has been resolved. He was last seen by the general surgeon on 08/17/13 and released to normal work.   Pain Follow Up  Location: generalized  Symptoms: abdominal pain with lying flat and activity; pt notes excruciating pain after clearing his driveway with the leaf blower Source: recurrent surgeries for diverticulitis, anastomosis repair, and ventral hernia reduction Current Treatment/Recent Course: oxycodone 5 mg PO q 4 hrs PRN Activity Increased: Yes - specifically  Aberant Behavior: No Adverse Reaction: No Recent Imaging: No    Blood in Stool  Pt has noted intermittently for past several weeks Has 3 soft bowel movements daily as long as taking stool softener  Does not strain No pain in rectum or peri-rectal area  Pt discussed this with surgeon who recommended f/u colonoscopy if it continues   Social - Pt with increased stress b/c going through a marital separation from his wife and son having trouble in school  Current Outpatient Prescriptions on File Prior to Visit  Medication Sig Dispense Refill  . albuterol (PROVENTIL HFA;VENTOLIN HFA) 108 (90 BASE) MCG/ACT inhaler Inhale 2 puffs into the lungs every 6 (six) hours as needed for wheezing or shortness of breath.  1 Inhaler  2  . ARIPiprazole (ABILIFY) 5 MG tablet Take 5 mg by mouth every morning.      . busPIRone (BUSPAR) 15 MG tablet Take 15 mg by mouth 4 (four) times daily.      . cyclobenzaprine (FLEXERIL) 10 MG tablet Take 1 tablet (10 mg total) by mouth 3 (three) times daily as needed for muscle spasms.  60 tablet  0  . diazepam (VALIUM) 5 MG tablet Take 1 tablet (5 mg total) by mouth 2 (two) times daily as needed for anxiety.  60 tablet  2  . lisinopril  (PRINIVIL,ZESTRIL) 10 MG tablet Take 20 mg by mouth daily.      . ondansetron (ZOFRAN ODT) 4 MG disintegrating tablet Take 1 tablet (4 mg total) by mouth every 8 (eight) hours as needed for nausea.  30 tablet  3  . oxyCODONE-acetaminophen (ROXICET) 5-325 MG per tablet Take 2 tablets by mouth every 4 (four) hours as needed for severe pain.  40 tablet  0  . polyethylene glycol (MIRALAX / GLYCOLAX) packet Take 17 g by mouth daily.      . varenicline (CHANTIX) 1 MG tablet Take 1 tablet (1 mg total) by mouth 2 (two) times daily.  60 tablet  2   No current facility-administered medications on file prior to visit.    Review of Systems  no nausea or vomiting    Objective:   Physical Exam BP 151/97  Pulse 97  Temp(Src) 97.8 F (36.6 C) (Oral)  Ht 5\' 10"  (1.778 m)  Wt 180 lb 3.2 oz (81.738 kg)  BMI 25.86 kg/m2  Gen: middle aged WM, mildly distressed but non ill appearing CV: RRR, no murmurs Pulm: CTA-B Abd: pt cannot lie completely supine due to pain in abdomen, no distention or guarding, generalized tenderness to palpation, very well healed central incision  Anus: no external hemorrhoids, no fissures, no internal hemorrhoids presents      Assessment & Plan:

## 2013-08-23 NOTE — Patient Instructions (Addendum)
[name],  It was good to see you. I am sorry about your issues at home. Please read below.   1. Pain - use the oxycodone 10-325 mg PO up to twice a day as needed. YOU NEED TO WALK AND IMPROVE YOUR STRENGTH AND FITNESS OR YOUR WILL NOT GET BETTER.   2. Bleeding - Call Dr. Marlou Starks. Also, stop taking ibuprofen, b/c this can cause bleeding and abdominal pain as well.   3. Anxiety - Please follow up with me in 2 weeks.   Take Care,   Dr. Maricela Bo

## 2013-08-24 ENCOUNTER — Encounter: Payer: Self-pay | Admitting: Psychology

## 2013-08-24 ENCOUNTER — Telehealth (INDEPENDENT_AMBULATORY_CARE_PROVIDER_SITE_OTHER): Payer: Self-pay | Admitting: *Deleted

## 2013-08-24 ENCOUNTER — Ambulatory Visit (INDEPENDENT_AMBULATORY_CARE_PROVIDER_SITE_OTHER): Payer: Medicaid Other | Admitting: Psychology

## 2013-08-24 ENCOUNTER — Other Ambulatory Visit (INDEPENDENT_AMBULATORY_CARE_PROVIDER_SITE_OTHER): Payer: Self-pay

## 2013-08-24 DIAGNOSIS — F17200 Nicotine dependence, unspecified, uncomplicated: Secondary | ICD-10-CM

## 2013-08-24 DIAGNOSIS — K921 Melena: Secondary | ICD-10-CM

## 2013-08-24 DIAGNOSIS — F319 Bipolar disorder, unspecified: Secondary | ICD-10-CM

## 2013-08-24 DIAGNOSIS — F1011 Alcohol abuse, in remission: Secondary | ICD-10-CM

## 2013-08-24 DIAGNOSIS — Z720 Tobacco use: Secondary | ICD-10-CM

## 2013-08-24 NOTE — Assessment & Plan Note (Signed)
Mood is not well managed.  Clinical picture is complicated by significant biopsychosocial factors.  Discussed what the "next right thing" might be and also some of the next not-so-right things.  Given his stress level and history, drinking any alcohol seems awfully risky.  Discussed this.    He is angry and irritable and underneath this, has a great deal of sadness - a lot of it related to his father's death.  He feels alone, torn and I suspect, unanchored.    He states that he thinks a lot about what we talk about after he leaves.  My sense is that while he benefits from the support, he has trouble with changing his thinking or behaviors that might help shift his mood.  He has a history of significant behavior change regarding substances but has slipped back to using caffeine and now alcohol.    Close follow-up is likely helpful.  He denies suicidal or homicidal ideation.  I consider him to be very vulnerable however from a relapse standpoint and making poor interpersonal decisions.  Discussed the possibility of voc rehab.  He seemed mildly interested but wanted to think about it.  See patient instructions for further plan.

## 2013-08-24 NOTE — Assessment & Plan Note (Signed)
Had a "few beers" at a party this past week.  Does not think this is a problem.  Would consider it more of a problem if he wanted to drink the next day.

## 2013-08-24 NOTE — Progress Notes (Signed)
Remo Lipps reports he continues to do poorly.  He is in pain in his abdomen - placed a call to his surgeon earlier.  He is concerned about blood in his stool (see Dr. Maricela Bo note from yesterday).  His son was suspended from school after communicating a threat and then getting in a fight with a Administrator.  He and [name] continue to live together despite considerable tension.  [name] reported that he went to a "party" the other night and spent the night away to show [name] that he could do that too.  He reported he drank a few beers while there.  He does not see this is as a big deal.  His last drink was June, 2013.  Denied the use of drugs at the party.  States marijuana was present.  [name]'s dad has offered him a chance to get out of the house but he doesn't know how he will afford it long term.  His mom may be able to help.  This would alleviate some of the stress if this were to be worked out.  Not interested in pursuing smoking cessation at present.

## 2013-08-24 NOTE — Telephone Encounter (Signed)
Called pt and told him we will send him back to Dr Forrestine Him with GI for this. Pt aware. We will call him with appt.

## 2013-08-24 NOTE — Patient Instructions (Signed)
Please schedule a meeting with me on 2/17 at 1:30.   Please schedule a [facility] appointment for 09/22/13 at 9:00.  I am not sure what we can do with your medications but it has been awhile since we have seen you there and it would be good to take a look at them.   I gave you information for [facility].  Hopefully they can provide some counseling for [facility].

## 2013-08-24 NOTE — Telephone Encounter (Signed)
Patient called to report that when he saw Dr. Marlou Starks last week he was having blood in his stool.  He states Dr. Marlou Starks told him to call back in a week if he was still having blood in the stool so he could be referred for a colonoscopy.  Patient called today to state that he is still having blood in is stool and it is remained about the same amount for the last week.  Explained that I would send a message to Dr. Marlou Starks to make him aware then we will give him a call with an update.  Patient states understanding and agreeable.

## 2013-08-24 NOTE — Assessment & Plan Note (Signed)
Not currently interested in quitting.  Will forward note to Dr. Valentina Lucks to see if he should stop the Chantix.  Not sure if Timothy [name] is still taking it but if he is and he has no intention of quitting, I can call and tell him to stop if that is the rec from Dr. Valentina Lucks.

## 2013-08-26 ENCOUNTER — Telehealth: Payer: Self-pay | Admitting: Psychology

## 2013-08-26 NOTE — Telephone Encounter (Signed)
Called [name] to discuss Chantix.  Dr. Valentina Lucks weighed in and recommended he stop it if he has no plans to quit in the next 30 days or so.  He does not.  He agreed to stop it.  He mentioned that he got his son a therapy appointment and he feels good about that.  Also had news that he was likely going to stay put, housing wise, for the foreseeable future.  Will discuss next visit.

## 2013-08-31 ENCOUNTER — Telehealth (INDEPENDENT_AMBULATORY_CARE_PROVIDER_SITE_OTHER): Payer: Self-pay | Admitting: *Deleted

## 2013-08-31 ENCOUNTER — Other Ambulatory Visit (INDEPENDENT_AMBULATORY_CARE_PROVIDER_SITE_OTHER): Payer: Self-pay | Admitting: *Deleted

## 2013-08-31 DIAGNOSIS — K921 Melena: Secondary | ICD-10-CM

## 2013-08-31 NOTE — Telephone Encounter (Signed)
I spoke with pt and informed him of the appt with Dr. Fuller Plan at [facility] on 09/01/13 @ 9:45am.  He is agreeable with this appt.

## 2013-09-01 ENCOUNTER — Encounter: Payer: Self-pay | Admitting: Gastroenterology

## 2013-09-01 ENCOUNTER — Ambulatory Visit: Payer: Medicaid Other | Admitting: Gastroenterology

## 2013-09-01 ENCOUNTER — Ambulatory Visit (INDEPENDENT_AMBULATORY_CARE_PROVIDER_SITE_OTHER): Payer: Medicaid Other | Admitting: Gastroenterology

## 2013-09-01 VITALS — BP 120/70 | HR 66 | Ht 70.0 in | Wt 180.8 lb

## 2013-09-01 DIAGNOSIS — K921 Melena: Secondary | ICD-10-CM

## 2013-09-01 DIAGNOSIS — R1084 Generalized abdominal pain: Secondary | ICD-10-CM

## 2013-09-01 DIAGNOSIS — K5900 Constipation, unspecified: Secondary | ICD-10-CM

## 2013-09-01 MED ORDER — HYDROCORTISONE ACETATE 25 MG RE SUPP: RECTAL | Status: DC

## 2013-09-01 NOTE — Progress Notes (Signed)
    History of Present Illness: This is a 48 year old male who had a partial colectomy for diverticulitis complicated by a leak requiring reoperation and colostomy in 2013 by Dr. Marlou Starks. He has since had closure of his colostomy by Dr. Marlou Starks. He then underwent repair of a large ventral hernia with mesh in 2014 by Dr. Marlou Starks. He underwent colonoscopy prior to all above in 12/2011 for evaluation of recurrent diverticulitis and rectal bleeding. Moderate diverticulosis in the sigmoid and descending colon as well as a small hyperplastic colon polyp were noted. He has a history of anal/perianal condyloma which was treated by Dr. Marlou Starks in 2013. He complains of  bright red blood per rectum for 2 or 3 days at a time occurring every several days of the past 3 weeks. He has not noted any bleeding in the past 5 days. He denies any constipation while taking daily MiraLax. He denies anal or rectal pain and any change in bowel habits. He was seen by his primary physician and a DRE was unremarkable with Hemoccult negative stool on 08/23/13. He has chronic generalized abdominal pain.  Review of Systems: Pertinent positive and negative review of systems were noted in the above HPI section. All other review of systems were otherwise negative.  Current Medications, Allergies, Past Medical History, Past Surgical History, Family History and Social History were reviewed in [employer] record.  Physical Exam: General: Well developed , well nourished, no acute distress Head: Normocephalic and atraumatic Eyes:  sclerae anicteric, EOMI Ears: Normal auditory acuity Mouth: No deformity or lesions Neck: Supple, no masses or thyromegaly Lungs: Clear throughout to auscultation Heart: Regular rate and rhythm; no murmurs, rubs or bruits Abdomen: Soft, non tender and non distended. No masses, hepatosplenomegaly or hernias noted. Normal Bowel sounds Rectal: no lesions or tenderness, heme neg brown stool, per PCP last  week, not repeated Musculoskeletal: Symmetrical with no gross deformities  Skin: No lesions on visible extremities Pulses:  Normal pulses noted Extremities: No clubbing, cyanosis, edema or deformities noted Neurological: Alert oriented x 4, grossly nonfocal Cervical Nodes:  No significant cervical adenopathy Inguinal Nodes: No significant inguinal adenopathy Psychological:  Alert and cooperative. Normal mood and affect  Assessment and Recommendations:  1. Small volume hematochezia. I suspect a benign anorectal source such as an internal hemorrhoid. He had a full colonoscopy in June 2013. Hydrocortisone suppositories daily for 2 weeks and then may use when necessary. If he has persistent problems with recurrent rectal bleeding consider flexible sigmoidoscopy or colonoscopy.  2. 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 Marlou Starks.  3. Chronic constipation. Continue high fiber diet with water and take a daily MiraLax.   4. Chronic generalized abdominal pain. I suspect abdominal wall pain or pain related to adhesions. No further GI workup needed at this time.

## 2013-09-01 NOTE — Patient Instructions (Signed)
We have sent the following medications to your pharmacy for you to pick up at your convenience: Ansuol suppositories.  Please call back if symptoms persist.   Thank you for choosing me and [facility].  Pricilla Riffle. Dagoberto Ligas., MD., Marval Regal

## 2013-09-02 ENCOUNTER — Telehealth: Payer: Self-pay | Admitting: Gastroenterology

## 2013-09-02 NOTE — Telephone Encounter (Signed)
Patient states the Anusol suppositories are over 100 dollars and cannot afford this. I told patient unfortunately there is no equivalent to this medicine. Told patient that I can ask Dr. Fuller Plan to change medications but it possibly could be a generic suppository that is over the counter instead and probably will not work as well. Pt agreed. Please advise Dr. Fuller Plan.

## 2013-09-02 NOTE — Telephone Encounter (Signed)
He can try prep H supp qd or bid or prep H cream qd or bid and try to insert the cream PR however these are not the same as HC supp

## 2013-09-02 NOTE — Telephone Encounter (Signed)
Informed patient of Dr. Lynne Leader recommendations and told patient to get try these over the counter medications and to call back if these do not improve his symptoms. Pt agreed and verbalized understanding.

## 2013-09-07 ENCOUNTER — Ambulatory Visit: Payer: Medicaid Other | Admitting: Psychology

## 2013-09-07 ENCOUNTER — Ambulatory Visit: Payer: Medicaid Other | Admitting: Family Medicine

## 2013-09-10 ENCOUNTER — Ambulatory Visit: Payer: Medicaid Other | Admitting: Gastroenterology

## 2013-09-13 ENCOUNTER — Ambulatory Visit (INDEPENDENT_AMBULATORY_CARE_PROVIDER_SITE_OTHER): Payer: Medicaid Other | Admitting: Family Medicine

## 2013-09-13 ENCOUNTER — Encounter: Payer: Self-pay | Admitting: Family Medicine

## 2013-09-13 VITALS — BP 143/86 | HR 98 | Temp 98.2°F | Ht 70.0 in | Wt 183.5 lb

## 2013-09-13 DIAGNOSIS — R1013 Epigastric pain: Secondary | ICD-10-CM

## 2013-09-13 DIAGNOSIS — I10 Essential (primary) hypertension: Secondary | ICD-10-CM

## 2013-09-13 DIAGNOSIS — F410 Panic disorder [episodic paroxysmal anxiety] without agoraphobia: Secondary | ICD-10-CM

## 2013-09-13 DIAGNOSIS — M7020 Olecranon bursitis, unspecified elbow: Secondary | ICD-10-CM

## 2013-09-13 DIAGNOSIS — M7022 Olecranon bursitis, left elbow: Secondary | ICD-10-CM | POA: Insufficient documentation

## 2013-09-13 DIAGNOSIS — G8929 Other chronic pain: Secondary | ICD-10-CM

## 2013-09-13 DIAGNOSIS — R1011 Right upper quadrant pain: Secondary | ICD-10-CM

## 2013-09-13 DIAGNOSIS — F411 Generalized anxiety disorder: Secondary | ICD-10-CM

## 2013-09-13 MED ORDER — DIAZEPAM 5 MG PO TABS: 5.0000 mg | ORAL_TABLET | Freq: Two times a day (BID) | ORAL | Status: DC | PRN

## 2013-09-13 MED ORDER — CYCLOBENZAPRINE HCL 10 MG PO TABS: 10.0000 mg | ORAL_TABLET | Freq: Three times a day (TID) | ORAL | Status: DC | PRN

## 2013-09-13 MED ORDER — OXYCODONE-ACETAMINOPHEN 10-325 MG PO TABS: 1.0000 | ORAL_TABLET | Freq: Two times a day (BID) | ORAL | Status: DC | PRN

## 2013-09-13 MED ORDER — BUSPIRONE HCL 15 MG PO TABS: 15.0000 mg | ORAL_TABLET | Freq: Four times a day (QID) | ORAL | Status: DC

## 2013-09-13 MED ORDER — LISINOPRIL 10 MG PO TABS: 20.0000 mg | ORAL_TABLET | Freq: Every day | ORAL | Status: DC

## 2013-09-13 NOTE — Patient Instructions (Signed)
Jake Church to see you bud.   1. Anxiety - Cutting back on the caffeine and also regular exercise will help you with the anxiety. Please use the diazepam and buspar as needed.   2. Abdominal Pain - The flexeril can help with the pain. The oxycodone can be used as needed.   3. Left arm swelling - Use ice and an ACE bandage for the swelling.    Sincerely,   Dr. Maricela Bo

## 2013-09-13 NOTE — Assessment & Plan Note (Signed)
A: traumatic bursitis from overuse, no evidence of septic or crystalline cause P:  - NO NSAIDS since pt hx of GI bleeding - Ice and compression, f/u 4 weeks

## 2013-09-13 NOTE — Progress Notes (Signed)
   Subjective:    Patient ID: Timothy [name], male    DOB: 16-Aug-1965, 48 y.o.   MRN: [id number]  HPI  48 year old M with bipolar disorder, tobacco abuse, and chronic abdominal pain s/p ventral hernia repair in October 2014.  Anxiety - Pt with know history of anxiety and panic disorder as well as bipolar. Has been taking buspar and diazepam for a > 1 year for both. Needs refills. Currently states that his anxiety is a 6/10. Biggest stressor is his relationship with his wife, because they are going through a separation. This is driven in part by financial difficulties from his being out of work for 2 years due to his abdominal surgeries and complications after recurrent diverticulitis.  Current medications: diazepam 5 mg PO BID, buspar 15 mg PO QID Coping strategies: sees Dr. Gwenlyn Saran psychologist, "being away from the house" Caffeine Use: 2 cups coffee, 4 diet mountain dews daily Smoking: 1.5 ppd EtOH/Recreational Drugs: denies but did have 4 beers one time 2 week ago; first time he drank in > 1.5 years, no desire to drink again  Pain Follow Up  Location: diffuse, but worse if left lower abdomen  Symptoms: "pinching and stabbing" worsened by movement, still cannot sit up without use from his arms due to pain; has to sleep ina recliner due to pain with lying completely supine  Source: s/p numerous abdominal surgeries for partial colectomy and repair for leaking anastomosis in 2013  with the most recent in Oct 2014 for a repair of a large ventral hernia performed by Dr. Marlou Starks  Current Treatment/Recent Course:oxycodone/acetaminophen 10/325 every 12 hours when necessary, last prescription 08/23/13, # 50 tabs with 0 refills; patient has used flexeril in past and would like more b/c he is out  Character 5-7/10  Analgesia 3/10  Activity Increased: Yes - able to walk longer distances and do more work around his yard, now able to go to grocery store and load/unload groceries which he could not do previously    Aberant Behavior: No  Adverse Reaction: No  Recent Imaging: No  Left Elbow Swelling 2 weeks duration, pt thinks it is due to overuse of the LUE since he cannot use his abdominal muscles, he denies falls, no redness or warmth  Review of Systems     Objective:   Physical Exam  BP 143/86  Pulse 98  Temp(Src) 98.2 F (36.8 C) (Oral)  Ht 5\' 10"  (1.778 m)  Wt 183 lb 8 oz (83.235 kg)  BMI 26.33 kg/m91  Gen: 48 year old M, mild distress but non ill, appears older than stated age Pulm: normal WOB Abd: well healed midline incision, no hernia, no masses, tenderness if left peri-umbilical area without palpable lesion Elbow: left elbow with effusion, non tender, normal extension, flexion of elbow as well as supination and pronation of the left arm  Skin: warm, dry, no rashes  Psych: flat affect, but improves when talking about prospects for work     Assessment & Plan:

## 2013-09-13 NOTE — Assessment & Plan Note (Signed)
A: pt w/ persistent anxiety that does not appear to be limiting him in any function at this time so no need for escalation of care P: - Counseled on need for regular exercise and reducing caffeine intake - Cont meeting with Dr. Gwenlyn Saran - Continue buspar and diazepam

## 2013-09-13 NOTE — Assessment & Plan Note (Signed)
A: pain persistent but functional status continuing to improve including more outdoor activity and going to the grocery store P:  - Give flexeril to help with muscle spasms - Give percocet 10-325 mg PO q 12 PRN # 60 for pain, no refills, can't fill until 3/2 and counseled that he will not be on chronic opioids - Encouraged exercise and light lifting

## 2013-09-22 ENCOUNTER — Ambulatory Visit (INDEPENDENT_AMBULATORY_CARE_PROVIDER_SITE_OTHER): Payer: Medicaid Other | Admitting: Psychology

## 2013-09-22 ENCOUNTER — Encounter: Payer: Self-pay | Admitting: Psychology

## 2013-09-22 DIAGNOSIS — F17200 Nicotine dependence, unspecified, uncomplicated: Secondary | ICD-10-CM

## 2013-09-22 DIAGNOSIS — F1011 Alcohol abuse, in remission: Secondary | ICD-10-CM

## 2013-09-22 DIAGNOSIS — F319 Bipolar disorder, unspecified: Secondary | ICD-10-CM

## 2013-09-22 DIAGNOSIS — Z720 Tobacco use: Secondary | ICD-10-CM

## 2013-09-22 NOTE — Assessment & Plan Note (Signed)
Denied use since last report.

## 2013-09-22 NOTE — Assessment & Plan Note (Signed)
Back up to 2 ppd.  Not interested in quitting.  Thinks smoking works better at managing his mood than his medicines.  Treatment team considered initiating Wellbutrin to hopefully address mood, lack of motivation and smoking but opted for a different plan - see bipolar diagnosis.

## 2013-09-22 NOTE — Patient Instructions (Signed)
Please schedule a follow-up for:  April 1st at 10:30 and then to see me on 3/9 at 9:00.   Please stop the Abilify and start the Latuda, 20 mg.  Most common side effect for Latuda is a headache or nausea however this medicine is generally very well tolerated (people tend to not have trouble with it).   Finding meaning or purpose in your life is one of the most important things you can do to start feeling better.  Investing in the relationships with your sons sounds great.  You may need something else that feels productive (like you felt when you were working).  Think about what you might want to do.  You may not feel like doing anything UNTIL YOU START DOING IT.  So trying different things might be helpful.

## 2013-09-22 NOTE — Assessment & Plan Note (Signed)
Report of mood is sad and irritable daily. Function is limited secondary to mood and pain.  He feels worthless.  His life lacks meaning and he is grieving the loss of his dad.  On a positive note, he is investing in his children, in part to cultivate the kind of bond that he had with his father.  Discussed treatment options.  Timothy [name] was unable to tolerate Abilify at 7.5 mg so we can't titrate up here.  Adding Wellbutrin is reasonable but given his diagnosis, switching to another mood stabilizer might be more beneficial.  Decided to stop Abilify and switch to Latuda 20 mg.  Discussed AE.  Patient voiced an understanding and agreed with the plan.  See patient instructions for further plan.

## 2013-09-22 NOTE — Progress Notes (Signed)
Timothy [name] presents for follow-up.  Chronic issues remain chronic including pain, difficult relationships in his home and more recently, the loss of his father.  Timothy [name] wonders whether he needs to increase his Abilify.  He also notes that he is tolerant to his Diazepam in that it no longer seems to affect him.  He thinks the Buspar is helpful.  Take 15 mg four times a day.    Mood is largely irritable and sad.  With regards to irritability, he reports his main way to deal with this is to withdraw or disengage and occasionally he exchanges nasty words with his wife.  Sadness is present everyday.    The thing that helps him manage his stress the most is smoking cigarettes.  He has also started investing more in his children.  He is going to corn hole tournaments with both kids and enjoying watching them play.  He can not think of anything else he is doing to help move himself in a more positive direction.

## 2013-09-27 ENCOUNTER — Ambulatory Visit: Payer: Medicaid Other | Admitting: Psychology

## 2013-09-27 ENCOUNTER — Telehealth: Payer: Self-pay | Admitting: Psychology

## 2013-09-27 NOTE — Telephone Encounter (Signed)
Timothy [name] called Sunday to cancel his appointment for today.  His Dad's best friend asked him to do something with him and he elected to do it.  I think this is a good decision.  Timothy [name] said he would call to reschedule in a few days.

## 2013-09-28 NOTE — Telephone Encounter (Signed)
Timothy [name] called to schedule an appointment.  He is not doing well.  Fishing trip with his dad's best friend yesterday really threw him.  In addition, the stress at the house has gotten to the point where he has decided to move in with his mother.  Asked what he needed to do to take care of himself today.  He had already called his mother and she was arriving when I called.  Scheduled an appointment for Thursday at 11:00.

## 2013-09-30 ENCOUNTER — Ambulatory Visit (INDEPENDENT_AMBULATORY_CARE_PROVIDER_SITE_OTHER): Payer: Medicaid Other | Admitting: Psychology

## 2013-09-30 DIAGNOSIS — F319 Bipolar disorder, unspecified: Secondary | ICD-10-CM

## 2013-09-30 NOTE — Patient Instructions (Signed)
Please schedule an appointment for 3/26 at 9:00. Take care of yourself and remember that you have CHOICES!!!

## 2013-09-30 NOTE — Progress Notes (Signed)
Timothy [name] presents for follow-up.  He is in better spirits than when I last talked to him on the phone.  The fishing trip to [facility] with his Dad's best friend was good but hard because the three of them used to go fishing together.  He will likely go again when asked.  He has decided to move out of his home this Saturday and will be moving in with his mom.  He thinks it is good to get out of the house and away from that dysfunction and he thinks his mom and step-dad's health has deteriorated to the point where it might be helpful if he lived there.  He says he is very irritable and will try to manage his temper, especially around his father-in-law, but is filled with so much anger that he does not know if he will be successful.  Discussed.  [name] has been suspended from school for 10 days.  Discussed.

## 2013-09-30 NOTE — Assessment & Plan Note (Addendum)
Report of mood is irritable.  Affect is consistent but he is reasonable with me.  There is not much room from a cognitive or behavioral perspective although the move out of his house should prove interesting.  Attempted to look at the downside of acting out with his father-in-law.  We come at this differently at least in part by cultural / environmental differences.  I did suggest that modeling the absence of violent behavior for his teenage son might be a good idea considering he just got suspended from school for threatening violent behavior.  I attempted to promote different self-talk about the situation but there was not much movement.  He denies homicidal behavior.    We did talk more about moving his body as a way to help clear his mind.  Fatigue sounds like a barrier perhaps more than pain.  Discussed deconditioning and the hope that he would eventually start to feel less fatigued after walking.    He does not think the addition of Latuda has helped.  Asked him to consider waiting until after the move to see if this might prove helpful.

## 2013-10-04 ENCOUNTER — Ambulatory Visit (INDEPENDENT_AMBULATORY_CARE_PROVIDER_SITE_OTHER): Payer: Medicaid Other | Admitting: Family Medicine

## 2013-10-04 ENCOUNTER — Encounter: Payer: Self-pay | Admitting: Family Medicine

## 2013-10-04 VITALS — BP 131/89 | HR 103 | Temp 97.6°F | Ht 70.0 in | Wt 175.0 lb

## 2013-10-04 DIAGNOSIS — IMO0001 Reserved for inherently not codable concepts without codable children: Secondary | ICD-10-CM

## 2013-10-04 DIAGNOSIS — I10 Essential (primary) hypertension: Secondary | ICD-10-CM

## 2013-10-04 DIAGNOSIS — J449 Chronic obstructive pulmonary disease, unspecified: Secondary | ICD-10-CM

## 2013-10-04 DIAGNOSIS — R1013 Epigastric pain: Secondary | ICD-10-CM

## 2013-10-04 DIAGNOSIS — J441 Chronic obstructive pulmonary disease with (acute) exacerbation: Secondary | ICD-10-CM

## 2013-10-04 DIAGNOSIS — G8929 Other chronic pain: Secondary | ICD-10-CM

## 2013-10-04 DIAGNOSIS — R1011 Right upper quadrant pain: Secondary | ICD-10-CM

## 2013-10-04 MED ORDER — LISINOPRIL 20 MG PO TABS: 20.0000 mg | ORAL_TABLET | Freq: Every day | ORAL | Status: DC

## 2013-10-04 MED ORDER — AZITHROMYCIN 250 MG PO TABS
ORAL_TABLET | ORAL | Status: DC
Start: 2013-10-04 — End: 2013-11-18

## 2013-10-04 MED ORDER — OXYCODONE-ACETAMINOPHEN 10-325 MG PO TABS: 1.0000 | ORAL_TABLET | Freq: Every day | ORAL | Status: DC | PRN

## 2013-10-04 NOTE — Progress Notes (Signed)
Subjective:    Patient ID: Timothy [name], male    DOB: Jan 30, 1966, 48 y.o.   MRN: [id number]  HPI  48 year old M with bipolar disorder, tobacco abuse, HTN and chronic abdominal pain s/p ventral hernia repair in October 2014.  Hypertension  Home BP monitoring: no  Office BP: BP Readings from Last 3 Encounters:  10/04/13 131/89  09/13/13 143/86  09/01/13 120/70    Prescribed meds: see below  Hypertension ROS:  Taking medications as prescribed:Yes Chest pain: No Shortness of breath: No Swelling of extremities: No TIA symptoms: No Regular exercise: No Low Na+ diet: No Alcohol/tobacco/drug use: Yes - smoking 2 ppd   URI Symptoms Cough: yes productive yes - brownish green Runny Nose: yes Sore Throat: yes Sinus Pressure: yes Shortness of Breath: no Fever/Chills: no Nausea/Vomiting; yes Diarrhea: no  Course: 3 days and worsening Treatments Tried: tussionex DM Exacerbating: smoking   Son with bronchitis recently   Abdominal pain - Chronic s/p numerous abomdinal procedures with last in October 2014 for large ventral hernia repair (see previous notes) - Symptoms has pinching where anchor stitches attach mesh to fascia, left side worse than right  - Worsened by squatting and twisting  - Activities limited by mood and recent illness as well as pain, but is walking more often  - Taking oxycodone 10 mg-325 mg PO q 12 PRN, with 10 left, also using flexeril PRN    Current Outpatient Prescriptions on File Prior to Visit  Medication Sig Dispense Refill  . albuterol (PROVENTIL HFA;VENTOLIN HFA) 108 (90 BASE) MCG/ACT inhaler Inhale 2 puffs into the lungs every 6 (six) hours as needed for wheezing or shortness of breath.  1 Inhaler  2  . ARIPiprazole (ABILIFY) 5 MG tablet Take 5 mg by mouth every morning.      . busPIRone (BUSPAR) 15 MG tablet Take 1 tablet (15 mg total) by mouth 4 (four) times daily.  120 tablet  5  . cyclobenzaprine (FLEXERIL) 10 MG tablet Take 1 tablet (10  mg total) by mouth 3 (three) times daily as needed for muscle spasms.  30 tablet  2  . diazepam (VALIUM) 5 MG tablet Take 1 tablet (5 mg total) by mouth 2 (two) times daily as needed for anxiety.  60 tablet  2  . hydrocortisone (ANUSOL-HC) 25 MG suppository Use rectally once daily x 14 days then as needed  14 suppository  1  . lisinopril (PRINIVIL,ZESTRIL) 10 MG tablet Take 2 tablets (20 mg total) by mouth daily.  60 tablet  5  . Lurasidone HCl (LATUDA) 20 MG TABS Take 20 mg by mouth. #30 with one refill.  Per Dr. Tammi Klippel in [facility].      Marland Kitchen oxyCODONE-acetaminophen (PERCOCET) 10-325 MG per tablet Take 1 tablet by mouth 2 (two) times daily as needed for pain.  60 tablet  0  . polyethylene glycol (MIRALAX / GLYCOLAX) packet Take 17 g by mouth daily.      . varenicline (CHANTIX) 1 MG tablet Take 1 tablet (1 mg total) by mouth 2 (two) times daily.  60 tablet  2   No current facility-administered medications on file prior to visit.     Review of Systems See HPI    Objective:   Physical Exam BP 131/89  Pulse 103  Temp(Src) 97.6 F (36.4 C) (Oral)  Ht 5\' 10"  (1.778 m)  Wt 175 lb (79.379 kg)  BMI 25.11 kg/m56  Gen: 48 year old M, mildly ill appearing but non distressed, appears  older than stated age  39: mild effusion behind it right tympanic membrane, left tympanic membrane without effusion or erythema; no frontal or maxillary sinus tenderness; oropharynx clear and moist without exudates; no submental or submaxillary or cervical lymphadenopathy Cardiovascular: sinus tachycardia, without murmurs Pulm: mildly increased WOB with expiratory wheezes in bases bilaterally, no consolidation on auscultation Abd: well healed midline incision, no hernia, no masses, tenderness if left peri-umbilical area without palpable lesion; patient able to lie in supine position when he previously was not able to do this due to pain in his abdomen Psych: flat affect      Assessment & Plan:

## 2013-10-04 NOTE — Assessment & Plan Note (Signed)
Assessment: patient with wheezing and productive cough consistent with bronchitis, while this is usually viral, I'm concerned about the potential bacterial infection given his smoking status and concern for emphysema Plan: treat with azithromycin, continue albuterol 2 puffs every 6 hours when necessary, if worsening start steroids

## 2013-10-04 NOTE — Assessment & Plan Note (Signed)
Assessment: patient still complains of pain but clearly has improved functioning kidney one off the exam table today Plan: given prescription for oxycodone 10-25 #30, which is half of his previous dose; patient was again counseled that we will be tapering the medication as he reaches 6 months postoperative in April

## 2013-10-04 NOTE — Patient Instructions (Signed)
[name],   Sorry that you are sick. Please read below for instructions.   1. Respiratory Infection - You likely have a viral bronchitis, but you are at higher risk for getting pneumonia with your smoking and damage to your lungs. Take azithromycin for 5 days. Use albuterol ever 4-6 hours. Let me know if you get worse.   2. Pain - Your movement looks better. Continue the exercise. Use the oxycodone and flexeril as needed.   3. Blood Pressure - Conntinue lisinopril and regular exercise, less caffeine and stopping smoking will be the biggest help.   Come back in 3-4 weeks.   Dr. Maricela Bo

## 2013-10-14 ENCOUNTER — Ambulatory Visit (INDEPENDENT_AMBULATORY_CARE_PROVIDER_SITE_OTHER): Payer: Medicaid Other | Admitting: Psychology

## 2013-10-14 ENCOUNTER — Encounter: Payer: Self-pay | Admitting: Psychology

## 2013-10-14 DIAGNOSIS — F319 Bipolar disorder, unspecified: Secondary | ICD-10-CM

## 2013-10-14 NOTE — Patient Instructions (Addendum)
Please schedule a follow-up for: April 7th at 10:00. We will see you on April 1st at 10:30 in [facility].   I am so happy you are feeling better with the move.  I hope the decreased stress continues to help your mood. I am excited about decreasing your pain medicine.  I think this might eventually improve your mood as well. Having some type of regular activity may also help your mood.  You agreed to walk 2 days a week down your dirt road.  1 mile round trip sounds like a good end point.  You thought that about half of that would be a reasonable starting place.  Roping Timothy [name] into it sounds like a really good idea.  Please record in your journal.

## 2013-10-14 NOTE — Assessment & Plan Note (Signed)
Report of mood is improved; less irritable, less sad.  He thinks the medicine has been helpful however the move is one week old and this is likely playing a role as well.  At 20 mg, he is at a low dose.  He has follow-up with [place] on April 1st.  He is attempting to stay away from the house during the day to give his parents a break from him and vice versa.  He visits friends mostly.  Work load has decreased since the move as he no longer has to pick up after people, do laundry or cook.  He does do odds and ends around the house to help his mom.    See patient instructions for further plan - specifically around moving his body more.

## 2013-10-14 NOTE — Progress Notes (Signed)
Timothy [name] presents for follow-up.  He reports he is feeling better emotionally since the move to his mother's.  He reports [name] is doing better as well.  He has some friendly contact with [name] and states that he misses aspects of the relationship but overall, it has been a wise choice.  Discussed his mom and step-dad's health, the situation with [name] and the resources Timothy [name] has enlisted and his decrease in pain medicine.  Focus from a behavioral standpoint was trying to add some structure to his day.

## 2013-10-20 ENCOUNTER — Encounter: Payer: Self-pay | Admitting: Psychology

## 2013-10-20 ENCOUNTER — Ambulatory Visit (INDEPENDENT_AMBULATORY_CARE_PROVIDER_SITE_OTHER): Payer: Medicaid Other | Admitting: Psychology

## 2013-10-20 DIAGNOSIS — F319 Bipolar disorder, unspecified: Secondary | ICD-10-CM

## 2013-10-20 NOTE — Progress Notes (Signed)
Timothy [name] reports he is feeling "blah" today with some mild increase in restlessness or irritability.  Two weeks ago he moved out of his house and into his mother's house.  He share a room with his teenage son.  Discussed potential impact of psychosocial change on mood.  Timothy [name] notes difficulty with sleep.  He goes to bed around 10:00 and falls asleep in about 30 minutes.  He reports he is up around 2:00 or 3:00 and then up again around 5:00.  He drinks 3-4 20 oz bottles of diet mountain dew daily along with two cups of coffee.  He had switched to caffeine free soda but did not like the taste.

## 2013-10-20 NOTE — Assessment & Plan Note (Addendum)
Report of mood is mildly sad and irritable.  Affect is consistent.  He has been on 20 mg of Latuda for nearly than a month and has noticed some benefit.  Dr. Tammi Klippel recommended he increase to 40 mg a day.    Recommended attempting to decrease caffeine use however this seems low on Timothy [name]'s priority list.  Finding something to invest in or that provides meaning or purpose is considered to be a huge component of successful treatment.  I am attempting to address this in his therapy environment.  Will see him there on 4/7 after his appointment with Dr. Maricela Bo.    See patient instructions for further plan.

## 2013-10-20 NOTE — Patient Instructions (Signed)
Please schedule a follow-up for: May 6th at 9:00. Dr. Tammi Klippel thought it was reasonable to increase your Latuda to 40 mg.  Given the change in your living situation, it makes sense to do this and reassess in about a month. Your caffeine intake is VERY HIGH.  If you want better sleep, reducing your caffeine intake would be helpful - especially in the evening time.   I will see you on the 7th at 10:00.

## 2013-10-26 ENCOUNTER — Encounter: Payer: Self-pay | Admitting: Family Medicine

## 2013-10-26 ENCOUNTER — Ambulatory Visit (INDEPENDENT_AMBULATORY_CARE_PROVIDER_SITE_OTHER): Payer: Medicaid Other | Admitting: Psychology

## 2013-10-26 ENCOUNTER — Ambulatory Visit (INDEPENDENT_AMBULATORY_CARE_PROVIDER_SITE_OTHER): Payer: Medicaid Other | Admitting: Family Medicine

## 2013-10-26 ENCOUNTER — Encounter: Payer: Self-pay | Admitting: Psychology

## 2013-10-26 VITALS — BP 148/96 | HR 106 | Temp 97.6°F | Ht 70.0 in | Wt 173.0 lb

## 2013-10-26 DIAGNOSIS — M25529 Pain in unspecified elbow: Secondary | ICD-10-CM

## 2013-10-26 DIAGNOSIS — F319 Bipolar disorder, unspecified: Secondary | ICD-10-CM

## 2013-10-26 DIAGNOSIS — M7022 Olecranon bursitis, left elbow: Secondary | ICD-10-CM

## 2013-10-26 DIAGNOSIS — G8929 Other chronic pain: Secondary | ICD-10-CM

## 2013-10-26 DIAGNOSIS — R1011 Right upper quadrant pain: Secondary | ICD-10-CM

## 2013-10-26 DIAGNOSIS — R1013 Epigastric pain: Secondary | ICD-10-CM

## 2013-10-26 DIAGNOSIS — M7020 Olecranon bursitis, unspecified elbow: Secondary | ICD-10-CM

## 2013-10-26 MED ORDER — OXYCODONE-ACETAMINOPHEN 10-325 MG PO TABS: 1.0000 | ORAL_TABLET | Freq: Every day | ORAL | Status: DC | PRN

## 2013-10-26 MED ORDER — METHYLPREDNISOLONE ACETATE 40 MG/ML IJ SUSP
40.0000 mg | Freq: Once | INTRAMUSCULAR | Status: AC
  Administered 2013-10-26: 40 mg via INTRA_ARTICULAR

## 2013-10-26 NOTE — Assessment & Plan Note (Signed)
A: stable P: continue intermittent oxycodone for one more month for abdominal pain

## 2013-10-26 NOTE — Assessment & Plan Note (Signed)
Report of mood is "blah" but he looked bright when I got him out of the waiting room.  Affect varied throughout the visit.  He was tearful when talking about his father.  He is open to increasing his walking a bit more.  Goal would eventually be to get to a mile.  He will consider.  When asked how I can best help him, he responded with "I think we are doing it."  He does seem to benefit from the support and the opportunity to vent and talk about his feelings.  Cognitive-behavioral changes are a challenge although he is quick to point out that he did the walking like he said he was going to.    Tolerating the 40 mg of Latuda.  No safety issues noted.  Has not noticed any difference in his mood.    I will print off some information on "caring for the caregiver" when it comes to dementia and have that ready for him next visit.  Reading is not his favorite thing.    He is scheduled for [place] next month.  We scheduled a follow-up Beh med for April 27th at 9:00.

## 2013-10-26 NOTE — Patient Instructions (Signed)
Timothy [name] to see you today. I am glad taht we could drain the elbow and hope that helps with the pain. Continue to ice and use a wrap on it. If it start turning red, hot, and painful then let me know immediately.   For your abdominal pain, use the oxycodone as needed.   See you in a few weeks.   Dr. Maricela Bo

## 2013-10-26 NOTE — Assessment & Plan Note (Signed)
A: persistent left olecranon bursitis painful to patient without concerns for infection or gout P: bursa drained and injected with steroids, see procedure note

## 2013-10-26 NOTE — Progress Notes (Signed)
Timothy [name] presents for follow-up.  He is anxious to get out of his mom's house but has no real means to do so unless his disability comes through.  He thinks he might have a hearing in May.  His mom has dementia and answering the same questions over and over is really frustrating for him.    He thinks the sadness regarding his dad is getting worse but he thinks his sadness is normal given the relationship with his dad and all of the things he has going on his life right now.  He is headed to [place] for two weeks over the summer and is wanting to see his father's marker.    He speaks to [place] daily.  He thinks the anger that he feels about her "kicking [him] when he is down" makes him stronger.    He is walking about 0.4 miles a day.  He finds this relaxing.  It takes him about 10 minutes and he often walks with his son.  He is worn out afterward.

## 2013-10-26 NOTE — Progress Notes (Signed)
   Subjective:    Patient ID: Timothy [name], male    DOB: 1966-02-23, 48 y.o.   MRN: [id number]  HPI  48 year old M with HTN, bipolar disorder and recent development of olecranon bursitis who presents for follow up.   Abdominal pain  - Chronic s/p numerous abomdinal procedures with last in October 2014 for large ventral hernia repair (see previous notes)  - Symptoms has pinching where anchor stitches attach mesh to fascia, left side worse than right  - Worsened by squatting and twisting  - Activities limited but he is trying to walk more  - Taking oxycodone 10 mg-325 mg PO q 12 PRN, with < 10 left with last refill on 10/11/13, also using flexeril PRN - Pt denies sedation, constipation, and itching from medications   Olecranon Bursitis - left sided  - It started in February and has improved mildly but it is still painful when the patient rests his elbow on any surface - He denies redness or warmth or drainage or limited range of motion - He requests intervention to drain it today   Review of Systems Negative for fever, chills, nausea, vomiting     Objective:   Physical Exam BP 148/96  Pulse 106  Temp(Src) 97.6 F (36.4 C) (Oral)  Ht 5\' 10"  (1.778 m)  Wt 173 lb (78.472 kg)  BMI 24.82 kg/m108  Gen: 48 year old M, well appearing today without usual stressed appearance  Pulm: normal WOB  Abd: well healed midline incision, no hernia, no masses, tenderness if left peri-umbilical area without palpable lesion  Elbow: left elbow with effusion, non tender, normal extension, flexion of elbow as well as supination and pronation of the left arm  Skin: warm, dry, no rashes       Assessment & Plan:  Procedures:  Draining of left olecranon bursitis  Aspiration/Injection Procedure Note Roxy Filler [id number] 01-Sep-1965  Procedure: Aspiration and Injection Indications: painful olecranon bursitis of left elbow  Procedure Details Consent: Risks of procedure as well as the alternatives  and risks of each were explained to the (patient/caregiver).  Consent for procedure obtained. Time Out: Verified patient identification, verified procedure, site/side was marked, verified correct patient position, special equipment/implants available, medications/allergies/relevent history reviewed, required imaging and test results available.  Performed   Local Anesthesia Used:lidocaine 2% with epinephrine , 5 mL Amount of Fluid Aspirated: 75mL Character of Fluid: bloody Fluid was sent for: no Injection: 40 mg depomedrol A sterile dressing was applied.  Patient did tolerate procedure well. Estimated blood loss: 10 mL  Dewain Penning 10/26/2013, 2:14 PM

## 2013-11-15 ENCOUNTER — Ambulatory Visit (INDEPENDENT_AMBULATORY_CARE_PROVIDER_SITE_OTHER): Payer: Medicaid Other | Admitting: Psychology

## 2013-11-15 ENCOUNTER — Encounter: Payer: Self-pay | Admitting: Psychology

## 2013-11-15 DIAGNOSIS — F319 Bipolar disorder, unspecified: Secondary | ICD-10-CM

## 2013-11-15 NOTE — Progress Notes (Signed)
Timothy [name] presents for follow-up.  I gave him some information on "Caring for the caregiver."  Living with his mom and step dad has been very stressful.  So much so that his step mom from [place] has agreed to rent him a house through January of next year.  He moves on May 1st and he is very excited about this.  He and [name] will have their own place.  He does not think the [name] is helping.  He is at 40 mg.  Sleep has been erratic with 15 hours one day and 3 hours another day.  Bed time is usually around 10:00 and he is generally an early riser but he says he has been sleeping more.  Energy is low.  Motivation is low.  Mood is reported as "snappy."  He has not done much of anything except watch television.  He is no longer walking.  He "tried it" and doesn't find it useful.

## 2013-11-15 NOTE — Patient Instructions (Signed)
We will see you for your [facility] appointment on May 6th at 9:00 and will discuss your medication then.  If you want to keep a mood chart (your journal), that might help [place] make decisions.  Tracking irritability and sadness would be good as well as sleep. Medication will only do so much.  Other things to consider include regular sleep (bed at the same time and up at the same time), regular meals, daily activity (e.g. Walking) and structure (something scheduled / planned) each day.  You might not feel like doing anything.  Often times, if you force yourself to move, the feeling will shift.

## 2013-11-15 NOTE — Assessment & Plan Note (Signed)
Motivation is very low right now.  Approach to illness and feeling better is passive.  Getting out of his mother's house might be helpful.  He is excited about a two week trip to [place].  Anything outside of that is not of interest to him presently.  Discussed the idea that if he waits for the motivation or energy to do something, he might be waiting a long time.  Often times the feeling follows.  He could think of nothing to do that might move him except maybe going to visit some friends.  I do think he is having trouble letting go of his "old life" that focused on working long hours and drinking a lot.  This life is "boring" and he is not presently motivated to create anything meaningful.  Disability determination puts him in a bit of a holding pattern.  I asked him what his life would look like if his disability came through.  He could not think of anything.    Will see in [facility] on May 6th to take another look at this medication.

## 2013-11-18 ENCOUNTER — Encounter: Payer: Self-pay | Admitting: Family Medicine

## 2013-11-18 ENCOUNTER — Ambulatory Visit (INDEPENDENT_AMBULATORY_CARE_PROVIDER_SITE_OTHER): Payer: Medicaid Other | Admitting: Family Medicine

## 2013-11-18 VITALS — BP 156/98 | HR 90 | Ht 70.0 in | Wt 175.6 lb

## 2013-11-18 DIAGNOSIS — G8929 Other chronic pain: Secondary | ICD-10-CM

## 2013-11-18 DIAGNOSIS — R1011 Right upper quadrant pain: Secondary | ICD-10-CM

## 2013-11-18 DIAGNOSIS — F411 Generalized anxiety disorder: Secondary | ICD-10-CM

## 2013-11-18 DIAGNOSIS — F410 Panic disorder [episodic paroxysmal anxiety] without agoraphobia: Secondary | ICD-10-CM

## 2013-11-18 DIAGNOSIS — I10 Essential (primary) hypertension: Secondary | ICD-10-CM

## 2013-11-18 DIAGNOSIS — R1013 Epigastric pain: Secondary | ICD-10-CM

## 2013-11-18 MED ORDER — CYCLOBENZAPRINE HCL 10 MG PO TABS: 10.0000 mg | ORAL_TABLET | Freq: Three times a day (TID) | ORAL | Status: DC | PRN

## 2013-11-18 MED ORDER — LISINOPRIL 40 MG PO TABS: 40.0000 mg | ORAL_TABLET | Freq: Every day | ORAL | Status: DC

## 2013-11-18 MED ORDER — DIAZEPAM 5 MG PO TABS: 5.0000 mg | ORAL_TABLET | Freq: Two times a day (BID) | ORAL | Status: DC | PRN

## 2013-11-18 MED ORDER — OXYCODONE-ACETAMINOPHEN 10-325 MG PO TABS: 1.0000 | ORAL_TABLET | Freq: Every day | ORAL | Status: DC | PRN

## 2013-11-18 NOTE — Assessment & Plan Note (Signed)
A: continues to have high BP. Multifactorial from tobacco use, caffeine use and essential hypertension P: increase lisinopril 40 mg daily, followup in one month

## 2013-11-18 NOTE — Assessment & Plan Note (Signed)
A: stable pain P:  - counseled on need to deal with chronic pain - refill flexeril, given oxycodone #10 for severe pain

## 2013-11-18 NOTE — Progress Notes (Signed)
Subjective:    Patient ID: Timothy [name], male    DOB: 06/29/1966, 48 y.o.   MRN: [id number]  HPI  48 year old M with bipolar disorder, tobacco abuse, HTN and chronic abdominal pain s/p ventral hernia repair in October 2014.  1. Hypertension  Home BP monitoring: no  Office BP: BP Readings from Last 3 Encounters:  11/18/13 156/98  10/26/13 148/96  10/04/13 131/89    Prescribed meds:   Hypertension ROS:  Taking medications as prescribed:Yes Chest pain: No Shortness of breath: No Swelling of extremities: No TIA symptoms: No Regular exercise: No Low Na+ diet: No Alcohol/tobacco/drug use: Yes - smokes 2 ppd, using lots of cafffeine  2. Anxiety - increased anxiety recently with the admission of his 65 year old son to inpatient behavioral health for making threatening comments at school. Moreover, the patient continues to get her divorce and is still unemployed. Thankfully, he has been able to obtain his own apartment. His stepmother has paid the rent for him through the end of 2015. He is very thankful for that. He is to use the BuSpar and diazepam twice a day. He is taking what to do for his bipolar disorder and is following up with mood disorder clinic.  3. Abdominal pain  - Chronic s/p numerous abomdinal procedures with last in October 2014 for large ventral hernia repair (see previous notes)  - Symptoms has pinching where anchor stitches attach mesh to fascia, left side worse than right  - Worsened by squatting and twisting  - He is not trying to increase his activity despite counseling on importance of exercise and walking - Taking oxycodone 10 mg-325 mg PO daily when necessary, with < 10 left with last refill on 10/26/13, also using flexeril PRN  - Pt denies sedation, constipation, and itching from medications - Pt knows that I will not prescribed chronic daily narcotic for this condition    Current Outpatient Prescriptions on File Prior to Visit  Medication Sig Dispense  Refill  . albuterol (PROVENTIL HFA;VENTOLIN HFA) 108 (90 BASE) MCG/ACT inhaler Inhale 2 puffs into the lungs every 6 (six) hours as needed for wheezing or shortness of breath.  1 Inhaler  2  . azithromycin (ZITHROMAX) 250 MG tablet Take 2 tablets today and 1 tablet for the next 4 days.  6 tablet  0  . busPIRone (BUSPAR) 15 MG tablet Take 1 tablet (15 mg total) by mouth 4 (four) times daily.  120 tablet  5  . cyclobenzaprine (FLEXERIL) 10 MG tablet Take 1 tablet (10 mg total) by mouth 3 (three) times daily as needed for muscle spasms.  30 tablet  2  . diazepam (VALIUM) 5 MG tablet Take 1 tablet (5 mg total) by mouth 2 (two) times daily as needed for anxiety.  60 tablet  2  . lisinopril (PRINIVIL,ZESTRIL) 20 MG tablet Take 1 tablet (20 mg total) by mouth daily.  90 tablet  3  . lurasidone (LATUDA) 40 MG TABS tablet Take 40 mg by mouth daily. Per Dr. Tammi Klippel in [facility].  #30 with three refills.  Take daily with a meal.      . Lurasidone HCl (LATUDA) 20 MG TABS Take 40 mg by mouth. #30 with two refills.  Per Dr. Tammi Klippel in [facility].      Marland Kitchen oxyCODONE-acetaminophen (PERCOCET) 10-325 MG per tablet Take 1 tablet by mouth daily as needed for pain.  30 tablet  0  . polyethylene glycol (MIRALAX / GLYCOLAX) packet Take 17 g by mouth daily.  No current facility-administered medications on file prior to visit.     Review of Systems Negative for fever, chills, nausea, vomiting, hematochezia    Objective:   Physical Exam BP 156/98  Pulse 90  Ht 5\' 10"  (1.778 m)  Wt 175 lb 9.6 oz (79.652 kg)  BMI 25.20 kg/m70  Gen: 48 year old M, fatigued appearing and in mild distress Pulm: normal WOB  Abd: well healed midline incision, no hernia, no masses, tenderness and left lower quadrant where there is a palpable anchor stitch into this fascia       Assessment & Plan:

## 2013-11-18 NOTE — Patient Instructions (Signed)
Dear Timothy [name],   Thank you for coming to clinic today. Please read below regarding the issues that we discussed.   1. High Blood Pressure - Let's try the higher dose of lisinopril to see if this helps.   2. Abdominal Pain - Use the flexeril as needed. Use the oxycodone for severe pain after exercise or lifting.   3. Anxiety - Cont with the diazepam and let me know if you need Buspar.   Please follow up in clinic in 1 month. Please call earlier if you have any questions or concerns.   Sincerely,   Dr. Maricela Bo

## 2013-11-18 NOTE — Assessment & Plan Note (Signed)
A: stable, lots of life stressors, poor commitment to using coping strategies P: cont buspar, refill diazepam

## 2013-11-24 ENCOUNTER — Ambulatory Visit (INDEPENDENT_AMBULATORY_CARE_PROVIDER_SITE_OTHER): Payer: Medicaid Other | Admitting: Psychology

## 2013-11-24 DIAGNOSIS — F319 Bipolar disorder, unspecified: Secondary | ICD-10-CM

## 2013-11-24 DIAGNOSIS — G4700 Insomnia, unspecified: Secondary | ICD-10-CM

## 2013-11-24 NOTE — Addendum Note (Signed)
Addended by: Zella Ball E on: 11/24/2013 12:26 PM   Modules accepted: Level of Service

## 2013-11-24 NOTE — Assessment & Plan Note (Signed)
Current report of sleep cycle seems pretty reasonable to treatment team.  Cutting out naps is positive.  Decreased caffeine intake might improve sleep some.

## 2013-11-24 NOTE — Assessment & Plan Note (Signed)
Report of mood remains irritable.  Affect is within normal limits.  Thoughts are clear and goal directed.  No evidence of SI / HI.  Report of sad mood seems pretty reasonable given the things going on his life.  Does not appear to interfere with function.  Irritability is the more bothersome symptom.  He is not all that interested in changing medications and thinks that the move to his own space offers the best hope for decreased irritability.  Treatment team highlighted to potential effect the caffeine and nicotine might be having.  He has switched to caffeine free soda before but taste is a barrier.  Suggested that if the irritability were significantly bothersome, experimenting with decreasing his caffeine intake would be reasonable.  See patient instructions for further plan.

## 2013-11-24 NOTE — Patient Instructions (Signed)
Please schedule a follow-up to see me on May 15th at 10:00.  [place] appointment is June 17th at 9:00. Experimenting with decreasing your caffeine intake might help your irritability. Moving your body might help move your mind.

## 2013-11-24 NOTE — Progress Notes (Signed)
Timothy [name] presents for follow-up.  He continues with irritability.  He got all moved into the new house and then sewer line broke and he is back at his mom's until it is fixed.  His son is going to be home schooled by a group called [employer].  He does not think this will be a problem.  He is looking forward to his son going to [place] for two months and also his trip to [place].  He reports about 4-5 days of bothersome sad mood in the last two days.  He states it lasted a "couple of hours."  Triggered largely by thoughts of his dad or the dynamic with his mom who has Parkinson's and dementia.    Sleep is reported to be between 6-8 hours a night with about 30-45 minutes to get to sleep and two night time awakenings that last about 15-20 minutes.  He denies napping during the day.  Drinks "5 or so" Diet [employer] daily as well as two cups of coffee.  Smoking 2 pdds.  Denies alcohol in the last week.  Denies THC.

## 2013-12-03 ENCOUNTER — Ambulatory Visit (INDEPENDENT_AMBULATORY_CARE_PROVIDER_SITE_OTHER): Payer: Medicaid Other | Admitting: Psychology

## 2013-12-03 ENCOUNTER — Encounter: Payer: Self-pay | Admitting: Psychology

## 2013-12-03 DIAGNOSIS — F319 Bipolar disorder, unspecified: Secondary | ICD-10-CM

## 2013-12-03 DIAGNOSIS — Z720 Tobacco use: Secondary | ICD-10-CM

## 2013-12-03 DIAGNOSIS — F17200 Nicotine dependence, unspecified, uncomplicated: Secondary | ICD-10-CM

## 2013-12-03 NOTE — Assessment & Plan Note (Addendum)
Report of mood is good.  Affect is consistent.  Motivation to change anything at this point is very low.  He would like to continue to meet.  Scheduled for a follow-up for May 27th at 9:00.  Will use MI techniques for key areas identified above to see if there is any shift in motivation.  Met for about 35 minute today.  May consider continuing to do that unless / until there is more to discuss.

## 2013-12-03 NOTE — Assessment & Plan Note (Signed)
Smoking 2 ppd.  Not interested in quitting.

## 2013-12-03 NOTE — Progress Notes (Signed)
Timothy [name] presents for follow-up.  He has been in his own house since May 7th and states a "weight has been lifted off [his] shoulders."  He has noticed a significant decrease in irritability.  We reviewed the following things:  1) Medication - thinks this is going well and does not wish any changes. 2) Grief - about the same; thinks the trip to [place] might create some shifts; does not think it is anything he needs to actively work on at this time 3) Substances (tobacco and caffeine) - not interested in making changes in either of these areas; smoking relaxes him and this is a priority for him presently 4) Engagement / purpose / meaning - He would like to see how things settle out from the move before he examines whether he needs to make any changes in this regard.

## 2013-12-07 ENCOUNTER — Telehealth (INDEPENDENT_AMBULATORY_CARE_PROVIDER_SITE_OTHER): Payer: Self-pay | Admitting: General Surgery

## 2013-12-07 NOTE — Telephone Encounter (Signed)
Patient called in wanting a letter stating that he does not have to wear a seat belt because of his surgery.  He was pulled over and given a ticket.  Informed him that Dr. Marlou Starks never stated this information and that he is in fact able to wear one.  Informed the patient we could not write this letter for him.

## 2013-12-15 ENCOUNTER — Ambulatory Visit (INDEPENDENT_AMBULATORY_CARE_PROVIDER_SITE_OTHER): Payer: Medicaid Other | Admitting: Psychology

## 2013-12-15 DIAGNOSIS — F319 Bipolar disorder, unspecified: Secondary | ICD-10-CM

## 2013-12-15 NOTE — Progress Notes (Signed)
[name] presents for follow-up.  He continues to feel less irritable in his new living situation.  He reports he is getting out more around his new home and enjoying his neighbors.  He is wondering whether he should try to go back to work.  He states that is his desire but he is concerned about his ability to be successful and what impact it might have on his disability claim.  He has discussed it with his lawyers and his former employer.  He states his former employer is willing to try him back and can be flexible (in terms of a modified schedule / restricted duties).  Discussed.  Has a meeting tomorrow at 1:00 pm to discuss [name]'s education.  [name]'s goal is to secure home schooling so [name] can get caught up and advance to the 10th grade on schedule.  [employer] has two lawyers and several advocates helping with this process and he is hopeful he can get [name] what he thinks he needs.

## 2013-12-15 NOTE — Assessment & Plan Note (Signed)
Report of mood is well managed.  Affect is consistent.  I think the move into a less stressful living environment may be freeing up his thinking a bit more and allowing him to explore possibilities.  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  Touched briefly on the idea of pacing himself; not setting himself up for failure.    Will meet for [facility] in mid June and then for beh med at the end of June.  I am doing about a 30 minute appointment providing support and checkign for shifts in motivation.

## 2013-12-22 ENCOUNTER — Encounter (HOSPITAL_COMMUNITY): Payer: Self-pay | Admitting: Emergency Medicine

## 2013-12-22 ENCOUNTER — Emergency Department (INDEPENDENT_AMBULATORY_CARE_PROVIDER_SITE_OTHER): Payer: Medicaid Other

## 2013-12-22 ENCOUNTER — Emergency Department (HOSPITAL_COMMUNITY)
Admission: EM | Admit: 2013-12-22 | Discharge: 2013-12-22 | Discharge status: Against Medical Advice | Payer: Medicaid Other | Attending: Emergency Medicine | Admitting: Emergency Medicine

## 2013-12-22 ENCOUNTER — Emergency Department (INDEPENDENT_AMBULATORY_CARE_PROVIDER_SITE_OTHER)
Admission: EM | Admit: 2013-12-22 | Discharge: 2013-12-22 | Disposition: A | Discharge status: Home / Self Care | Payer: Medicaid Other | Source: Home / Self Care | Attending: Family Medicine | Admitting: Family Medicine

## 2013-12-22 ENCOUNTER — Telehealth: Payer: Self-pay | Admitting: Family Medicine

## 2013-12-22 DIAGNOSIS — R109 Unspecified abdominal pain: Secondary | ICD-10-CM

## 2013-12-22 DIAGNOSIS — I10 Essential (primary) hypertension: Secondary | ICD-10-CM | POA: Insufficient documentation

## 2013-12-22 LAB — COMPREHENSIVE METABOLIC PANEL
ALT: 16 U/L (ref 0–53)
AST: 20 U/L (ref 0–37)
Albumin: 4.3 g/dL (ref 3.5–5.2)
Alkaline Phosphatase: 107 U/L (ref 39–117)
BUN: 11 mg/dL (ref 6–23)
CALCIUM: 9.9 mg/dL (ref 8.4–10.5)
CHLORIDE: 100 meq/L (ref 96–112)
CO2: 24 meq/L (ref 19–32)
CREATININE: 0.82 mg/dL (ref 0.50–1.35)
GLUCOSE: 97 mg/dL (ref 70–99)
Potassium: 4.5 mEq/L (ref 3.7–5.3)
Sodium: 137 mEq/L (ref 137–147)
Total Protein: 8.2 g/dL (ref 6.0–8.3)

## 2013-12-22 LAB — CBC WITH DIFFERENTIAL/PLATELET
BASOS ABS: 0.1 10*3/uL (ref 0.0–0.1)
Basophils Relative: 1 % (ref 0–1)
EOS PCT: 2 % (ref 0–5)
Eosinophils Absolute: 0.2 10*3/uL (ref 0.0–0.7)
HCT: 40.8 % (ref 39.0–52.0)
HEMOGLOBIN: 13.6 g/dL (ref 13.0–17.0)
LYMPHS ABS: 3.3 10*3/uL (ref 0.7–4.0)
Lymphocytes Relative: 36 % (ref 12–46)
MCH: 28.8 pg (ref 26.0–34.0)
MCHC: 33.3 g/dL (ref 30.0–36.0)
MCV: 86.3 fL (ref 78.0–100.0)
MONO ABS: 0.7 10*3/uL (ref 0.1–1.0)
MONOS PCT: 8 % (ref 3–12)
NEUTROS ABS: 5 10*3/uL (ref 1.7–7.7)
Neutrophils Relative %: 53 % (ref 43–77)
Platelets: 372 10*3/uL (ref 150–400)
RBC: 4.73 MIL/uL (ref 4.22–5.81)
RDW: 16 % — ABNORMAL HIGH (ref 11.5–15.5)
WBC: 9.2 10*3/uL (ref 4.0–10.5)

## 2013-12-22 LAB — LIPASE, BLOOD: Lipase: 25 U/L (ref 11–59)

## 2013-12-22 NOTE — ED Provider Notes (Signed)
CSN: [id number]     Arrival date & time 12/22/13  1906 History   First MD Initiated Contact with Patient 12/22/13 1917     Chief Complaint  Patient presents with  . Abdominal Pain   (Consider location/radiation/quality/duration/timing/severity/associated sxs/prior Treatment) HPI Comments: Hx of partial colectomy for recurrent diverticulitis with bowel leakage/peritonitis and subsequent abdominal exploration and colostomy. Colostomy reversal 7 months later. Ventral hernia repair in Oct. 2014.   Patient is a 48 y.o. male presenting with abdominal pain. The history is provided by the patient.  Abdominal Pain Pain location:  LLQ Pain quality: aching and sharp   Pain radiates to:  Does not radiate Pain severity:  Moderate Onset quality:  Gradual Duration:  1 day Timing:  Constant Progression:  Worsening Context: previous surgery   Relieved by:  Nothing Worsened by:  Movement and palpation Ineffective treatments: no relief with percocet taken at home. Associated symptoms: anorexia, constipation and nausea   Associated symptoms: no belching, no chest pain, no chills, no cough, no diarrhea, no dysuria, no fatigue, no fever, no flatus, no hematemesis, no hematochezia, no hematuria, no melena, no shortness of breath, no sore throat and no vomiting   Risk factors: multiple surgeries     Past Medical History  Diagnosis Date  . Hypoglycemia   . GERD (gastroesophageal reflux disease)   . Diverticulosis     By colonoscopy June 2005  . Lower GI bleed     June 2005. Presumably secondary to diverticulosis.  . Anxiety   . Alcohol abuse   . Hemorrhoids   . Family history of anesthesia complication     Mother N/V  . Bronchitis     history  . Cellulitis     - left knee-2009  . Glaucoma syndrome     does not use eye drops, "They burn".  . Elevated CK 3/13  . Depression   . Incisional hernia   . Prinzmetal angina   . History of stomach ulcers 2005  . Headache(784.0)     "alot; not daily"  (07/23/2012)  . Migraines     "not often" (07/23/2012)  . Condyloma 02/04/2012  . Acute bronchitis 02/15/2013  . Hypertension     started 3 months ago  . Bronchitis   . History of dizziness   . Emphysema lung   . Emphysema of lung    Past Surgical History  Procedure Laterality Date  . Appendectomy  1982  . Elbow fracture surgery  ~ 1975    left;  pins inserted   . Wisdom tooth extraction  ~ 1983  . Laparoscopic left colon resection  02/19/2012    SIGMOID  . Wart fulguration  02/19/2012    Procedure: FULGURATION ANAL WART;  Surgeon: Merrie Roof, MD;  Location: [place];  Service: General;  Laterality: N/A;  Destroy  Anal Condyloma   . Laparotomy  02/27/2012    Procedure: EXPLORATORY LAPAROTOMY;  Surgeon: Merrie Roof, MD;  Location: [place];  Service: General;  Laterality: N/A;  . Colostomy  02/27/2012    Procedure: COLOSTOMY;  Surgeon: Merrie Roof, MD;  Location: [place];  Service: General;  Laterality: N/A;  . Colon resection  02/27/2012    Procedure: COLON RESECTION;  Surgeon: Merrie Roof, MD;  Location: [name];  Service: General;  Laterality: N/A;  . Colostomy takedown  07/23/2012  . Abdominal exploration surgery  07/23/2012    w/LOA (07/23/2012)  . Colostomy takedown  07/23/2012  Procedure: COLOSTOMY TAKEDOWN;  Surgeon: Merrie Roof, MD;  Location: [place];  Service: General;  Laterality: N/A;  Primary Anastomosis  . Lesion destruction  07/23/2012    Procedure: DESTRUCTION LESION ANUS;  Surgeon: Merrie Roof, MD;  Location: [place];  Service: General;  Laterality: N/A;  DESTRUCTION ANAL CONDYLOMA  . Lysis of adhesion  07/23/2012    Procedure: LYSIS OF ADHESION;  Surgeon: Merrie Roof, MD;  Location: [place];  Service: General;  Laterality: N/A;  . Laparotomy  07/23/2012    Procedure: EXPLORATORY LAPAROTOMY;  Surgeon: Merrie Roof, MD;  Location: [place];  Service: General;  Laterality: N/A;  . Ventral hernia repair  05/20/2013    Dr Marlou Starks  . Ventral hernia repair N/A 05/20/2013     Procedure: VENTRAL HERNIA REPAIR ;  Surgeon: Merrie Roof, MD;  Location: [place];  Service: General;  Laterality: N/A;  . Insertion of mesh N/A 05/20/2013    Procedure: INSERTION OF MESH;  Surgeon: Merrie Roof, MD;  Location: [place];  Service: General;  Laterality: N/A;   Family History  Problem Relation Age of Onset  . Diabetes Mother   . Parkinsonism Mother   . Depression Mother   . Alcohol abuse Father   . Hypertension Father   . Breast cancer Maternal Aunt   . Stroke Neg Hx   . Heart disease Neg Hx   . Anxiety disorder Father   . Ulcers Father   . Colon polyps Father   . Diabetes Mother   . Diabetes Maternal Grandmother    History  Substance Use Topics  . Smoking status: Current Every Day Smoker -- 1.00 packs/day for 31 years    Types: Cigarettes    Start date: 07/22/1981  . Smokeless tobacco: Former Systems developer    Types: Chew     Comment: 06/25/2013 "raely uses dip"/Previous 2 PPD smoker  . Alcohol Use: No     Comment: h/o 24 PK/ WEEKEND; 07/23/2012 "sober since 02/12/2013"    Review of Systems  Constitutional: Negative for fever, chills and fatigue.  HENT: Negative for sore throat.   Respiratory: Negative for cough and shortness of breath.   Cardiovascular: Negative for chest pain.  Gastrointestinal: Positive for nausea, abdominal pain, constipation and anorexia. Negative for vomiting, diarrhea, melena, hematochezia, flatus and hematemesis.  Genitourinary: Negative for dysuria and hematuria.    Allergies  Ambien; Darvocet; Morphine and related; and Bactroban  Home Medications   Prior to Admission medications   Medication Sig Start Date End Date Taking? Authorizing Provider  albuterol (PROVENTIL HFA;VENTOLIN HFA) 108 (90 BASE) MCG/ACT inhaler Inhale 2 puffs into the lungs every 6 (six) hours as needed for wheezing or shortness of breath. 07/26/13  Yes Angelica Ran, MD  busPIRone (BUSPAR) 15 MG tablet Take 1 tablet (15 mg total) by mouth 4 (four) times daily.  09/13/13  Yes Angelica Ran, MD  cyclobenzaprine (FLEXERIL) 10 MG tablet Take 1 tablet (10 mg total) by mouth 3 (three) times daily as needed for muscle spasms. 11/18/13  Yes Angelica Ran, MD  diazepam (VALIUM) 5 MG tablet Take 1 tablet (5 mg total) by mouth 2 (two) times daily as needed for anxiety. 11/18/13  Yes Angelica Ran, MD  lisinopril (PRINIVIL,ZESTRIL) 40 MG tablet Take 1 tablet (40 mg total) by mouth daily. 11/18/13  Yes Angelica Ran, MD  lurasidone (LATUDA) 40 MG TABS tablet Take 40 mg by mouth daily. Per  Dr. Tammi Klippel in [facility].  #30 with five refills.  Take daily with a meal. 11/24/13 05/26/14 Yes Historical Provider, MD  Lurasidone HCl (LATUDA) 20 MG TABS Take 40 mg by mouth. #30 with two refills.  Per Dr. Tammi Klippel in [facility]. 10/20/13 01/19/14 Yes Historical Provider, MD  oxyCODONE-acetaminophen (PERCOCET) 10-325 MG per tablet Take 1 tablet by mouth daily as needed for pain. 11/18/13  Yes Angelica Ran, MD  polyethylene glycol (MIRALAX / GLYCOLAX) packet Take 17 g by mouth daily.   Yes Historical Provider, MD   BP 160/102  Pulse 100  Temp(Src) 98.3 F (36.8 C) (Oral)  Resp 16  SpO2 96% Physical Exam  Nursing note and vitals reviewed. Constitutional: He is oriented to person, place, and time. He appears well-developed and well-nourished.  +appears uncomfortable  HENT:  Head: Normocephalic and atraumatic.  Eyes: Conjunctivae are normal. No scleral icterus.  Neck: Normal range of motion.  Cardiovascular: Normal rate, regular rhythm and normal heart sounds.   Pulmonary/Chest: Effort normal and breath sounds normal. No respiratory distress. He has no wheezes.  Abdominal: Soft. Bowel sounds are decreased. There is tenderness in the left lower quadrant. There is no rigidity, no rebound, no guarding and no CVA tenderness.    Musculoskeletal: Normal range of motion.  Neurological: He is alert and oriented to person, place, and time.  Skin: Skin is warm and dry. No rash  noted. No erythema.  Psychiatric: He has a normal mood and affect. His behavior is normal.    ED Course  Procedures (including critical care time) Labs Review Labs Reviewed - No data to display  Imaging Review Dg Abd 2 Views  12/22/2013   CLINICAL DATA:  Abdominal pain. Abnormal bowel movements. Dry heaves.  EXAM: ABDOMEN - 2 VIEW  COMPARISON:  CT abdomen and pelvis 07/09/2013. Plain films chest and abdomen 12/31/2012.  FINDINGS: No free intraperitoneal air is identified. The bowel gas pattern is normal. No abnormal abdominal calcification is seen. No focal bony abnormality is identified.  IMPRESSION: Negative exam.   Electronically Signed   By: Inge Rise M.D.   On: 12/22/2013 19:48     MDM   1. Abdominal pain   No indication of SBO on flat/upright abdominal films. Concerning that patient is having escalating pain with associated nausea and vomiting that cannot be controlled at home with percocet. Ddx includes early SBO not visible on plain radiographs, recurrent ventral or incarcerated hernia. Will transfer to ER for possible additional imaging of abdomen.   [place], [place] 12/22/13 [id number]

## 2013-12-22 NOTE — ED Notes (Signed)
Patient co severe abdominal area pain and tightness all day. Last BM was today (hard) . Abdominal area distended , tight, no relief of abdominal pain w oxycodone. History of diverticular disease w colostomy, reversal of colostomy, surgical hernia w repair, peritonitis due to surgical problem

## 2013-12-22 NOTE — ED Notes (Addendum)
C/o abd pain, onset this am, reports had dry heaves this am, denies other sx, seen at [facility] PTA, h/o colostomy with reversal and peritonitis and diverticulitis (4 abdominal surgeries). Last ate 1100am. Last BM 4 hrs ago (normal formed). Took pain med and muscle relaxer PTA. "Home pain meds not touching the pain". (Denies: nausea, continued vomiting, bleeding, fever, urinary sx or other sx).

## 2013-12-22 NOTE — Telephone Encounter (Signed)
Pt called to let me know that he was having severe abdominal pain. It is located on the left side where the anchor stitch is located. He notes that he has been more active than usual the last few days. The pain is not controlled with oxycodone. I suggested that he go to Urgent Care. He agreed with the plan.

## 2013-12-22 NOTE — ED Notes (Signed)
Pt gave his pager to registration and states he is leaving.  Pt encouraged to stay and states he is leaving.

## 2013-12-22 NOTE — ED Notes (Signed)
Patient desires to transport himself to ED for further evaluation. Has not received pain medication (also indicated he wished to smoke prior to going into the ED) advised to go to the ED as soon as poss, and is to remain otherwise NPO

## 2013-12-23 NOTE — ED Provider Notes (Signed)
Medical screening examination/treatment/procedure(s) were performed by a resident physician or non-physician practitioner and as the supervising physician I was immediately available for consultation/collaboration.  Dilana Mcphie, MD    Allena Pietila S Tylisha Danis, MD 12/23/13 0745 

## 2013-12-24 ENCOUNTER — Ambulatory Visit: Payer: Medicaid Other | Admitting: Family Medicine

## 2013-12-24 ENCOUNTER — Encounter: Payer: Self-pay | Admitting: Family Medicine

## 2013-12-24 ENCOUNTER — Ambulatory Visit (INDEPENDENT_AMBULATORY_CARE_PROVIDER_SITE_OTHER): Payer: Medicaid Other | Admitting: Family Medicine

## 2013-12-24 VITALS — BP 128/89 | HR 81 | Temp 97.5°F | Ht 70.0 in | Wt 178.8 lb

## 2013-12-24 DIAGNOSIS — R0602 Shortness of breath: Secondary | ICD-10-CM

## 2013-12-24 DIAGNOSIS — R0600 Dyspnea, unspecified: Secondary | ICD-10-CM

## 2013-12-24 DIAGNOSIS — R0609 Other forms of dyspnea: Secondary | ICD-10-CM

## 2013-12-24 DIAGNOSIS — G8929 Other chronic pain: Secondary | ICD-10-CM

## 2013-12-24 DIAGNOSIS — R1011 Right upper quadrant pain: Secondary | ICD-10-CM

## 2013-12-24 DIAGNOSIS — F17200 Nicotine dependence, unspecified, uncomplicated: Secondary | ICD-10-CM

## 2013-12-24 DIAGNOSIS — I10 Essential (primary) hypertension: Secondary | ICD-10-CM

## 2013-12-24 DIAGNOSIS — J441 Chronic obstructive pulmonary disease with (acute) exacerbation: Secondary | ICD-10-CM

## 2013-12-24 DIAGNOSIS — Z720 Tobacco use: Secondary | ICD-10-CM

## 2013-12-24 DIAGNOSIS — R0989 Other specified symptoms and signs involving the circulatory and respiratory systems: Secondary | ICD-10-CM

## 2013-12-24 DIAGNOSIS — R109 Unspecified abdominal pain: Secondary | ICD-10-CM

## 2013-12-24 MED ORDER — OXYCODONE-ACETAMINOPHEN 10-325 MG PO TABS: 1.0000 | ORAL_TABLET | Freq: Three times a day (TID) | ORAL | Status: DC | PRN

## 2013-12-24 MED ORDER — ALBUTEROL SULFATE HFA 108 (90 BASE) MCG/ACT IN AERS: 2.0000 | INHALATION_SPRAY | Freq: Four times a day (QID) | RESPIRATORY_TRACT | Status: DC | PRN

## 2013-12-24 NOTE — Patient Instructions (Signed)
Dear [name],   Thank you for coming to clinic today. Please read below regarding the issues that we discussed.   1. Abdominal Pain - Let's continue the flexeril and Oxychodone for severe breakthrough pain.   2. Physical therapy - I have placed a referral, so they will call you.   3. Breathing issues - bottom line you need to quit smoking. Continue the inhaler as needed  Please call if you have any questions or concerns.   Sincerely,   Dr. Maricela Bo

## 2013-12-24 NOTE — Assessment & Plan Note (Signed)
A: improved control today P: continue lisinopril 40 mg daily, calcium on smoking cessation, caffeine reduction, and regular exercise

## 2013-12-24 NOTE — Progress Notes (Addendum)
   Subjective:    Patient ID: Timothy [name], male    DOB: April 03, 1966, 48 y.o.   MRN: [id number]  HPI  48 year old M with bipolar disorder, tobacco abuse, HTN and chronic abdominal pain s/p ventral hernia repair in October 2014.  Hypertension  Home BP monitoring: no  Office BP: BP Readings from Last 3 Encounters:  12/24/13 128/89  12/22/13 131/94  12/22/13 160/102    Prescribed meds: lisinopril 40 mg  Hypertension ROS:  Taking medications as prescribed:Yes Chest pain: No Shortness of breath: Yes - using an inhaler Swelling of extremities: No TIA symptoms: No Regular exercise: No Low Na+ diet: No Alcohol/tobacco/drug use: Yes - smoking 2 ppd  2. 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 busPIRone (BUSPAR) 15 MG tablet Take 1 tablet (15 mg total) by mouth 4 (four) times daily.  120 tablet  5  . lisinopril (PRINIVIL,ZESTRIL) 40 MG tablet Take 1 tablet (40 mg total) by mouth daily.  90 tablet  3  . lurasidone (LATUDA) 40 MG TABS tablet Take 40 mg by mouth daily. Per Dr. Tammi Klippel in [facility].  #30 with five refills.  Take daily with a meal.      . albuterol (PROVENTIL HFA;VENTOLIN HFA) 108 (90 BASE) MCG/ACT inhaler Inhale 2 puffs into the lungs every 6  (six) hours as needed for wheezing or shortness of breath.  1 Inhaler  2  . cyclobenzaprine (FLEXERIL) 10 MG tablet Take 1 tablet (10 mg total) by mouth 3 (three) times daily as needed for muscle spasms.  30 tablet  2  . diazepam (VALIUM) 5 MG tablet Take 1 tablet (5 mg total) by mouth 2 (two) times daily as needed for anxiety.  60 tablet  2  . oxyCODONE-acetaminophen (PERCOCET) 10-325 MG per tablet Take 1 tablet by mouth daily as needed for pain.  10 tablet  0   No current facility-administered medications on file prior to visit.     Review of Systems See HPI    Objective:   Physical Exam BP 128/89  Pulse 81  Temp(Src) 97.5 F (36.4 C) (Oral)  Ht 5\' 10"  (1.778 m)  Wt 178 lb 12.8 oz (81.103 kg)  BMI 25.66 kg/m53  Gen: 48 year old M, non distress, pleasant CV: RRR Pulm: normal WOB, CTA-B, no wheezes  Abd: well healed midline incision, no hernia, no masses, tenderness and left lower quadrant where there is a palpable anchor stitch into this fascia Psych: normal affect, thought content and speech pattern        Assessment & Plan:

## 2013-12-24 NOTE — Assessment & Plan Note (Signed)
A: pain exacerbated by recent attempt at work and was lifting of more than 20 pounds, no concern for bowel obstruction or acute abdomen P: continue Flexeril and oxycodone when necessary, refer to physical therapy for functional evaluation

## 2013-12-24 NOTE — Assessment & Plan Note (Signed)
A: Unclear etiology at thsi time, since LFT's performed in 2015 did not show any COPD, but he is still an avid smoker and dyspnea improved by albuterol P:  - counseled on smoking cessation - given refill of albuterol - no further work up at this time

## 2013-12-24 NOTE — Assessment & Plan Note (Signed)
A: 2 pack per day smoker with respiratory symptoms P: counseled on need to quit smoking, patient not ready

## 2014-01-05 ENCOUNTER — Ambulatory Visit (INDEPENDENT_AMBULATORY_CARE_PROVIDER_SITE_OTHER): Payer: Medicaid Other | Admitting: Psychology

## 2014-01-05 ENCOUNTER — Encounter: Payer: Self-pay | Admitting: Psychology

## 2014-01-05 DIAGNOSIS — F319 Bipolar disorder, unspecified: Secondary | ICD-10-CM

## 2014-01-05 NOTE — Progress Notes (Signed)
Timothy [name] presents for follow-up.  He reports all is going well.  No complaints.  He is a bit nervous about Father's Day this Sunday but has a plan to be with his sons and stay busy.  He attempted to work a bit with his old boss and ended up in the ED thinking he might have re-injured his stomach.  His pain level is back to his baseline now.  He planted a garden and is staying busy with that.

## 2014-01-05 NOTE — Patient Instructions (Signed)
I am already scheduled to see you on the 26th at 11:00.   Glad you are feeling well.  Caffeine intake and cigarettes are something we may consider to discuss since they are important to your health.   Check your Latuda and make sure you have enough refills.

## 2014-01-05 NOTE — Assessment & Plan Note (Signed)
Report of mood is good.  Affect is consistent.  Thoughts are clear and goal directed.  He states "I don't think we need to make any changes."  Treatment team agrees.  Will follow in [place] [facility] for continued support and will see him back in [facility] as needed.

## 2014-01-10 ENCOUNTER — Encounter: Payer: Self-pay | Admitting: Family Medicine

## 2014-01-10 ENCOUNTER — Ambulatory Visit (INDEPENDENT_AMBULATORY_CARE_PROVIDER_SITE_OTHER): Payer: Medicaid Other | Admitting: Family Medicine

## 2014-01-10 VITALS — BP 133/91 | HR 91 | Temp 98.2°F | Ht 70.5 in | Wt 172.5 lb

## 2014-01-10 DIAGNOSIS — F411 Generalized anxiety disorder: Secondary | ICD-10-CM

## 2014-01-10 DIAGNOSIS — F17200 Nicotine dependence, unspecified, uncomplicated: Secondary | ICD-10-CM

## 2014-01-10 DIAGNOSIS — Z720 Tobacco use: Secondary | ICD-10-CM

## 2014-01-10 DIAGNOSIS — G8929 Other chronic pain: Secondary | ICD-10-CM

## 2014-01-10 DIAGNOSIS — F410 Panic disorder [episodic paroxysmal anxiety] without agoraphobia: Secondary | ICD-10-CM

## 2014-01-10 DIAGNOSIS — R109 Unspecified abdominal pain: Secondary | ICD-10-CM

## 2014-01-10 DIAGNOSIS — R1011 Right upper quadrant pain: Secondary | ICD-10-CM

## 2014-01-10 MED ORDER — OXYCODONE-ACETAMINOPHEN 10-325 MG PO TABS: 1.0000 | ORAL_TABLET | Freq: Three times a day (TID) | ORAL | Status: DC | PRN

## 2014-01-10 MED ORDER — DIAZEPAM 5 MG PO TABS: 5.0000 mg | ORAL_TABLET | Freq: Two times a day (BID) | ORAL | Status: DC | PRN

## 2014-01-10 NOTE — Patient Instructions (Addendum)
Dear Timothy [name],   Thank you for coming to clinic today. Please read below regarding the issues that we discussed.   1. Abdominal Pain - Overall, you are doing well. I am impressed with the improvement that you have made in the last few months. You can still continue the flexeril as needed for cramps and oxycodone as needed, but not more than 10x per month. As discussed, if you need more than this, we will refer you for chronic pain management. I would not suggest this however.   2. Breathing - These tests will need to be repeated, but bottom line, you have to quit smoking or you will end up like your father with bad lung cancer.   3. New Practice - I will be at [place], [phone]. Call there to make sure you can get an appointment, and then change your Medicaid card.   Please follow up in clinic in 2 months. Please call earlier if you have any questions or concerns.   Sincerely,   Dr. Maricela Bo

## 2014-01-10 NOTE — Assessment & Plan Note (Signed)
A: stable P: continue diazepam and buspar, recommended decrease caffeine

## 2014-01-10 NOTE — Assessment & Plan Note (Signed)
A: clearly worsening lung function P: counseled on quitting, but no plan made since pt not motivated to quit

## 2014-01-10 NOTE — Assessment & Plan Note (Signed)
A: stable, no evidence of complications of surgery or treatment P: cont pain meds with max of 10 per month and referral to chronic pain mgt if more needed

## 2014-01-10 NOTE — Progress Notes (Signed)
   Subjective:    Patient ID: Timothy [name], male    DOB: [birth year], 48 y.o.   MRN: [id number]  HPI  48 year old M with bipolar disorder, tobacco abuse, HTN and chronic abdominal pain s/p ventral hernia repair in October 2014.  1. Anxiety -  Stable on dose of diazepam and buspar; still taking medications daily, still smoking and lots of caffeine use; less stressful home environment since living on his own    2. Abdominal pain  - Acute on Chronic s/p numerous abomdinal procedures including partial colectomy with diverted colostomy and takedown; last in October 2014 for large ventral hernia repair (see previous notes)  - Patient denies severe pain, but notes frequent cramping on the left side - Overall, functional status is mildly improved without be able to return to work or lift anything heavier than 20 lbs - He has required 4-5 tablets of oxycodone since his last visit for severe pain, which helps him function better at home - Has not received any information regarding referral to physical therapy  - Denies nausea, vomiting or constipation  3. Tobacco Use - Persistent, > 2 ppd, no interest in quitting although he does acknowledge the harm that it is causing to his breathing   Review of Systems     Objective:   Physical Exam BP 133/91  Pulse 91  Temp(Src) 98.2 F (36.8 C) (Oral)  Ht 5' 10.5" (1.791 m)  Wt 172 lb 8 oz (78.245 kg)  BMI 24.39 kg/m26   Gen: 48 year old M, non distress, pleasant  CV: RRR  Pulm: normal WOB, CTA-B, mild end expiratory wheezes in all fields   Abd: well healed midline incision, no hernia, no masses, tenderness and left lower quadrant where there is a palpable anchor stitch into this fascia  Psych: normal affect, thought content and speech pattern       Assessment & Plan:

## 2014-01-12 NOTE — Addendum Note (Signed)
Addended by: Angelica Ran on: 01/12/2014 10:08 AM   Modules accepted: Orders

## 2014-01-14 ENCOUNTER — Ambulatory Visit: Payer: Medicaid Other | Admitting: Psychology

## 2014-01-27 ENCOUNTER — Ambulatory Visit: Payer: Medicaid Other | Attending: Family Medicine | Admitting: Physical Therapy

## 2014-01-27 ENCOUNTER — Telehealth: Payer: Self-pay | Admitting: Psychology

## 2014-01-27 DIAGNOSIS — M545 Low back pain, unspecified: Secondary | ICD-10-CM | POA: Diagnosis present

## 2014-01-27 DIAGNOSIS — M6281 Muscle weakness (generalized): Secondary | ICD-10-CM | POA: Insufficient documentation

## 2014-01-27 NOTE — Telephone Encounter (Signed)
Patient reports he is doing well.  Scheduled to have a tooth removed surgically (secondary to an abscess) on July 15th.  Would like to see me before but I don't have openings.  Scheduled for the 17th and hopefully he will be up to it.  Will call if he isn't.    He plans to meet his new doctor this week to review med history and discuss pain medicines related to the dental issues.  Dr. Maricela Bo really worked with Timothy [name] to get him down and eventually off the pain medicines.

## 2014-01-28 ENCOUNTER — Encounter: Payer: Self-pay | Admitting: Family Medicine

## 2014-01-28 ENCOUNTER — Ambulatory Visit (INDEPENDENT_AMBULATORY_CARE_PROVIDER_SITE_OTHER): Payer: Medicaid Other | Admitting: Family Medicine

## 2014-01-28 VITALS — BP 146/87 | HR 80 | Temp 97.7°F | Ht 70.5 in | Wt 174.8 lb

## 2014-01-28 DIAGNOSIS — R109 Unspecified abdominal pain: Secondary | ICD-10-CM

## 2014-01-28 DIAGNOSIS — G8929 Other chronic pain: Secondary | ICD-10-CM

## 2014-01-28 DIAGNOSIS — Z9889 Other specified postprocedural states: Secondary | ICD-10-CM

## 2014-01-28 MED ORDER — OXYCODONE-ACETAMINOPHEN 10-325 MG PO TABS: 1.0000 | ORAL_TABLET | Freq: Three times a day (TID) | ORAL | Status: DC | PRN

## 2014-01-28 NOTE — Progress Notes (Signed)
Patient ID: Timothy [name], male   DOB: 1966-02-18, 48 y.o.   MRN: [id number]    Timothy Lynch, MD Phone: [phone]  Subjective:  Chief complaint-noted  Pt Here for here for pain med refill due to future dental procedure.   This is my first time meeting with the patient. Patient states that he has a appointment to remove a broken tooth on Wednesday, July 15. Patient has a long history of narcotic use for abdominal surgery and subsequent pain. He has worked very hard with his previous PCP (Dr. Maricela [name]) to decrease the amount of narcotic use he requires day by day. His oral surgeon has asked [place] to manage his pain control for his upcoming procedure.  Patient states that he has been managing his pain very well with a monthly prescription of 10 total oxycodone 10/325 tablets. He would like to continue his efforts in cutting back on narcotic use. He has recently begun physical therapy yesterday (01/27/2014). He is very interested in continuing his goal therapy and believes that this will help in his long-term pain control. With that said he does ask for help searching for a new physical therapist that will be covered by his Medicare.  Patient does not need any medication refills today. Patient is a Clinical biochemist by trade. He is currently seeking disability.  Review of Systems  Constitutional: Negative for fever, chills, weight loss and malaise/fatigue.  HENT: Negative for nosebleeds and sore throat.   Eyes: Negative for blurred vision and double vision.  Respiratory: Negative for cough.   Cardiovascular: Negative for chest pain, palpitations and leg swelling.  Gastrointestinal: Positive for abdominal pain. Negative for nausea, vomiting, diarrhea and constipation.  Skin: Negative for rash.  Neurological: Negative for dizziness and headaches.  Psychiatric/Behavioral: Negative for suicidal ideas.     Past Medical History Patient Active Problem List   Diagnosis Date Noted  . Recent dental  procedure 01/28/2014  . Dyspnea 12/24/2013  . Olecranon bursitis of left elbow 09/13/2013  . Grief 08/05/2013  . Acute bronchitis 07/26/2013  . Abdominal pain, chronic, diffuse 04/15/2013  . Akathisia 03/02/2013  . Hyperglycemia 03/02/2013  . Ventral hernia 01/11/2013  . Migraine headache 08/23/2012  . Essential hypertension, benign 06/28/2012  . Bipolar disorder, suspect Bipolar I 06/02/2012  . Insomnia 04/27/2012  . Panic anxiety syndrome 03/21/2012  . History of colostomy 03/21/2012  . History of alcohol abuse 10/29/2011  . History of diverticulitis of colon 10/16/2011  . Tobacco abuse disorder 10/16/2011    Medications- reviewed and updated Current Outpatient Prescriptions  Medication Sig Dispense Refill  . albuterol (PROVENTIL HFA;VENTOLIN HFA) 108 (90 BASE) MCG/ACT inhaler Inhale 2 puffs into the lungs every 6 (six) hours as needed for wheezing or shortness of breath.  1 Inhaler  2  . busPIRone (BUSPAR) 15 MG tablet Take 1 tablet (15 mg total) by mouth 4 (four) times daily.  120 tablet  5  . cyclobenzaprine (FLEXERIL) 10 MG tablet Take 1 tablet (10 mg total) by mouth 3 (three) times daily as needed for muscle spasms.  30 tablet  2  . diazepam (VALIUM) 5 MG tablet Take 1 tablet (5 mg total) by mouth 2 (two) times daily as needed for anxiety.  60 tablet  2  . lisinopril (PRINIVIL,ZESTRIL) 40 MG tablet Take 1 tablet (40 mg total) by mouth daily.  90 tablet  3  . lurasidone (LATUDA) 40 MG TABS tablet Take 40 mg by mouth daily. Per Dr. Tammi Klippel in [facility].  #30 with five refills.  Take daily with a meal.      . oxyCODONE-acetaminophen (PERCOCET) 10-325 MG per tablet Take 1 tablet by mouth every 8 (eight) hours as needed for pain.  10 tablet  0   No current facility-administered medications for this visit.    Objective: BP 146/87  Pulse 80  Temp(Src) 97.7 F (36.5 C) (Oral)  Ht 5' 10.5" (1.791 m)  Wt 174 lb 12.8 oz (79.289 kg)  BMI 24.72 kg/m2 Gen: NAD, alert, cooperative with  exam HEENT: NCAT, EOMI, PERRL CV: RRR, good S1/S2, no murmur Resp: CTABL, no wheezes, non-labored Abd: SNTND, BS present, no guarding or organomegaly Ext: No edema, warm Neuro: Alert and oriented, No gross deficits   Assessment/Plan:  Recent dental procedure -Patient is scheduled to have a tooth removed on Wednesday, July 15 of 2015 at the office of "[facility] [place] [place]" [phone]) -Patient I have agreed upon a prescription of oxycodone 10/325 x10 tablets for pain control after this procedure. - Patient is to pick up narcotics prescription on Monday at the front office - Patient and I have signed a new narcotics contract and the patient voices his understanding of its meaning.     No orders of the defined types were placed in this encounter.    No orders of the defined types were placed in this encounter.

## 2014-01-28 NOTE — Assessment & Plan Note (Signed)
-  Patient is scheduled to have a tooth removed on Wednesday, July 15 of 2015 at the office of "[facility] [place]" [phone]) -Patient I have agreed upon a prescription of oxycodone 10/325 x10 tablets for pain control after this procedure. - Patient is to pick up narcotics prescription on Monday at the front office - Patient and I have signed a new narcotics contract and the patient voices his understanding of its meaning.

## 2014-01-28 NOTE — Patient Instructions (Signed)
Come back for Medication prescription on Monday. It will be waiting for you up front. See me back in 2 weeks to go over pain management and future.   Good luck with the dental procedure. It was a pleasure meeting you.

## 2014-02-04 ENCOUNTER — Encounter: Payer: Self-pay | Admitting: Psychology

## 2014-02-04 ENCOUNTER — Ambulatory Visit (INDEPENDENT_AMBULATORY_CARE_PROVIDER_SITE_OTHER): Payer: Medicaid Other | Admitting: Psychology

## 2014-02-04 DIAGNOSIS — F319 Bipolar disorder, unspecified: Secondary | ICD-10-CM

## 2014-02-04 NOTE — Progress Notes (Signed)
Timothy [name] presents for follow-up.  He reported he is doing okay overall but has noticed he is not as motivated as of late.  He is staying in his recliner more and sleeping a lot throughout the day.  He wonders whether an increase in his Latuda might be helpful.  He has been at 40 mg since April 1st.  Tolerating the medicine fine.  His son went to [place] to visit his mother.  He was supposed to stay the summer but only lasted three weeks.  He is back home with Timothy [name] now.  This may affect Timothy [name]'s plans to go to [facility].  Timothy [name] was engaging in PT but insurance / cost is a barrier.  He was offered and reports being interested in paying $30 a month to go to the PT place and have monitored work-outs but he can't afford that.  He is working in his garden some.

## 2014-02-04 NOTE — Assessment & Plan Note (Signed)
Will discuss possible increase in [place] with Dr. Tammi Klippel to see what he says and then see Timothy [name] back in [facility] on 8/19.    Discussed with Timothy [name] the possible behavioral changes he could make that might positively influence his mood (cognitive strategies are not useful).  Focus was on increasing structure and scheduling things.  The expectation would be that doing more would make him more motivated rather than waiting to get motivated to do more.  Consistent with his past in this regard, Timothy [name] is not able to generate any reasonable plans for himself.  He does not think he can work.  He is not interested in volunteering.  There are financial barriers to consider.  Will follow in [place] to continue to monitor movement.  Despite MI techniques in the past and today, there is not much movement.

## 2014-02-07 ENCOUNTER — Telehealth: Payer: Self-pay | Admitting: Psychology

## 2014-02-07 NOTE — Telephone Encounter (Signed)
Last therapy visit, Timothy [name] wondered whether he could increase his Latuda to 60 mg.  Discussed with Dr. Tammi Klippel who thought it was reasonable.  Called in a new prescription to [place] and let him know.  Told him if the increase felt like too much medicine or if he had any difficulty, he can call me.  We are scheduled to follow in [facility] on August 19th.

## 2014-02-21 ENCOUNTER — Ambulatory Visit (INDEPENDENT_AMBULATORY_CARE_PROVIDER_SITE_OTHER): Payer: Medicaid Other | Admitting: Family Medicine

## 2014-02-21 ENCOUNTER — Encounter: Payer: Self-pay | Admitting: Family Medicine

## 2014-02-21 VITALS — BP 128/88 | HR 114 | Ht 70.5 in | Wt 177.0 lb

## 2014-02-21 DIAGNOSIS — Z720 Tobacco use: Secondary | ICD-10-CM

## 2014-02-21 DIAGNOSIS — R1013 Epigastric pain: Secondary | ICD-10-CM

## 2014-02-21 DIAGNOSIS — F17200 Nicotine dependence, unspecified, uncomplicated: Secondary | ICD-10-CM

## 2014-02-21 DIAGNOSIS — F411 Generalized anxiety disorder: Secondary | ICD-10-CM

## 2014-02-21 DIAGNOSIS — G8929 Other chronic pain: Secondary | ICD-10-CM

## 2014-02-21 DIAGNOSIS — R109 Unspecified abdominal pain: Secondary | ICD-10-CM

## 2014-02-21 DIAGNOSIS — J441 Chronic obstructive pulmonary disease with (acute) exacerbation: Secondary | ICD-10-CM

## 2014-02-21 DIAGNOSIS — R1011 Right upper quadrant pain: Secondary | ICD-10-CM

## 2014-02-21 DIAGNOSIS — R0602 Shortness of breath: Secondary | ICD-10-CM

## 2014-02-21 MED ORDER — CYCLOBENZAPRINE HCL 10 MG PO TABS: 10.0000 mg | ORAL_TABLET | Freq: Three times a day (TID) | ORAL | Status: DC | PRN

## 2014-02-21 MED ORDER — OXYCODONE-ACETAMINOPHEN 10-325 MG PO TABS: 1.0000 | ORAL_TABLET | Freq: Three times a day (TID) | ORAL | Status: DC | PRN

## 2014-02-21 MED ORDER — BUSPIRONE HCL 15 MG PO TABS: 15.0000 mg | ORAL_TABLET | Freq: Four times a day (QID) | ORAL | Status: DC

## 2014-02-21 MED ORDER — ALBUTEROL SULFATE HFA 108 (90 BASE) MCG/ACT IN AERS: 2.0000 | INHALATION_SPRAY | Freq: Four times a day (QID) | RESPIRATORY_TRACT | Status: DC | PRN

## 2014-02-21 NOTE — Progress Notes (Addendum)
Timothy Lynch, MD Phone: [phone]  Subjective:  Chief complaint-noted  Pt Here for medication refill. Patient is a pleasant 48 year old male with an extensive abdominal surgical history. Patient was seen last month for pain management do to a scheduled dental procedure which his oral surgeon has asked that we manage this pain. Patient states that the pain plan that we had placed him on was adequate. Patient states that today he is currently a 3/10. His baseline is a 3-4/10. At our last visit we discussed the possibility patient entering physical therapy for this abdominal pain he has been experiencing. He states that he is very much interested in the idea of physical therapy however financially he is unable to receive it. Medicaid did help pay for one session which he was given handouts, instruction, and materials necessary for at home rehabilitation. Patient I discussed at length the importance of staying motivated during the rehabilitation process. I asked that he attempts to perform at home rehabilitation for the next 3 weeks. I asked that he be mentally prepared for physical soreness due to this physical therapy and that he is to be mindful not to confuse muscle soreness with actual pain. We also discussed the possibility of decreasing his monthly opiate use over time. We discussed the fact that this is going to be a long-term goal and that although I would like to have him no longer taking narcotics for pain, I also do not want him in debilitating pain. Patient voiced understanding and acknowledged the fact that he too would like to no longer be dependent on narcotic use.  Patient I also discussed is excessive tobacco use. Patient has smoked in since the age of 40 and currently smokes 2 packs per day of 100s. He states that he is tried to quit smoking in the past however this was unsuccessful. He states that he is currently not interested in attempting to quit again. However he does understand that  I plan to bring this up at each of our visits.  ROS: Patient denied recent nausea, vomiting, diarrhea, fever, chills, sweats, chest pain, dyspnea, urinary frequency/hesitancy, headaches.  Past Medical History Patient Active Problem List   Diagnosis Date Noted  . Recent dental procedure 01/28/2014  . Dyspnea 12/24/2013  . Olecranon bursitis of left elbow 09/13/2013  . Grief 08/05/2013  . Acute bronchitis 07/26/2013  . Abdominal pain, chronic, diffuse 04/15/2013  . Akathisia 03/02/2013  . Hyperglycemia 03/02/2013  . Ventral hernia 01/11/2013  . Migraine headache 08/23/2012  . Essential hypertension, benign 06/28/2012  . Bipolar disorder, suspect Bipolar I 06/02/2012  . Insomnia 04/27/2012  . Panic anxiety syndrome 03/21/2012  . History of colostomy 03/21/2012  . History of alcohol abuse 10/29/2011  . History of diverticulitis of colon 10/16/2011  . Tobacco abuse disorder 10/16/2011    Medications- reviewed and updated Current Outpatient Prescriptions  Medication Sig Dispense Refill  . albuterol (PROVENTIL HFA;VENTOLIN HFA) 108 (90 BASE) MCG/ACT inhaler Inhale 2 puffs into the lungs every 6 (six) hours as needed for wheezing or shortness of breath.  1 Inhaler  2  . busPIRone (BUSPAR) 15 MG tablet Take 1 tablet (15 mg total) by mouth 4 (four) times daily.  120 tablet  5  . cyclobenzaprine (FLEXERIL) 10 MG tablet Take 1 tablet (10 mg total) by mouth 3 (three) times daily as needed for muscle spasms.  30 tablet  2  . diazepam (VALIUM) 5 MG tablet Take 1 tablet (5 mg total) by mouth 2 (two) times daily as  needed for anxiety.  60 tablet  2  . lisinopril (PRINIVIL,ZESTRIL) 40 MG tablet Take 1 tablet (40 mg total) by mouth daily.  90 tablet  3  . lurasidone (LATUDA) 40 MG TABS tablet Take 60 mg by mouth daily. Dr. Tammi Klippel authorized an increase in dose from 40 to 60 mg.  I called the pharmacy to make the change.      . Lurasidone HCl (LATUDA) 60 MG TABS Take 60 mg by mouth. Dr. Tammi Klippel  authorized an increase in dose from 40 to 60 mg.  Called pharmacy to change the prescription.  #30 with five refills.      Marland Kitchen oxyCODONE-acetaminophen (PERCOCET) 10-325 MG per tablet Take 1 tablet by mouth every 8 (eight) hours as needed for pain.  10 tablet  0   No current facility-administered medications for this visit.    Objective: BP 128/88  Pulse 114  Ht 5' 10.5" (1.791 m)  Wt 177 lb (80.287 kg)  BMI 25.03 kg/m2 Gen: NAD, alert, cooperative with exam HEENT: NCAT, EOMI, PERRL CV: RRR, good S1/S2, no murmur Resp: CTABL, no wheezes, non-labored Abd: BS present, diffuse scars noted from extensive abdominal surgery history. Moderate diffuse tenderness throughout.  Ext: No edema, warm Neuro: Alert and oriented, No gross deficits   Assessment/Plan:  Abdominal pain, chronic, diffuse Patient still experiences chronic abdominal pain. Today was a 3/10 (baseline is 3-4/10). His history of abdominal surgery is extensive and places him at greater risk of chronic pain.  - Patient encouraged to continue his home PT regimen and to work toward a regular daily routine - 10 tabs of 10/325mg  Oxycodone prescribed for PRN pain - We discussed the possibility of reducing this pain medication as well as other medications over time. However I do not want to make any drastic changes at this time.  Tobacco abuse disorder Patient has smoked since age of 32. He currently smokes 2 packs per day of cigarette 100s. Patient attempted to quit smoking on multiple occasions. He is currently not ready for smoking cessation. - Discussed with patient the availability I would have if he ever becomes ready to take a step forward towards quitting. - We discussed the possibility of reducing his cigarette use. Patient is currently not ready.    No orders of the defined types were placed in this encounter.    Meds ordered this encounter  Medications  . albuterol (PROVENTIL HFA;VENTOLIN HFA) 108 (90 BASE) MCG/ACT  inhaler    Sig: Inhale 2 puffs into the lungs every 6 (six) hours as needed for wheezing or shortness of breath.    Dispense:  1 Inhaler    Refill:  2  . busPIRone (BUSPAR) 15 MG tablet    Sig: Take 1 tablet (15 mg total) by mouth 4 (four) times daily.    Dispense:  120 tablet    Refill:  5  . cyclobenzaprine (FLEXERIL) 10 MG tablet    Sig: Take 1 tablet (10 mg total) by mouth 3 (three) times daily as needed for muscle spasms.    Dispense:  30 tablet    Refill:  2  . oxyCODONE-acetaminophen (PERCOCET) 10-325 MG per tablet    Sig: Take 1 tablet by mouth every 8 (eight) hours as needed for pain.    Dispense:  10 tablet    Refill:  0    May not fill until after January 19, 2014.

## 2014-02-21 NOTE — Progress Notes (Deleted)
  [name], MD Phone: [phone]  Subjective:  Chief complaint-noted  Pt Here for ***  Med refill  Dental went well. Pain at a 3 (baseline 3-4)     Hx: 01/19/12 - diverticulitis; colon/bowel removal; had perf bowel 7/13; ostomy bag (68months); reversal 1/14; 11/14 massive hernia repair.  Out of work since 10/14/11. Working on disability  Still smoking 2pack/day.     ROS-  Past Medical History Patient Active Problem List   Diagnosis Date Noted  . Recent dental procedure 01/28/2014  . Dyspnea 12/24/2013  . Olecranon bursitis of left elbow 09/13/2013  . Grief 08/05/2013  . Acute bronchitis 07/26/2013  . Abdominal pain, chronic, diffuse 04/15/2013  . Akathisia 03/02/2013  . Hyperglycemia 03/02/2013  . Ventral hernia 01/11/2013  . Migraine headache 08/23/2012  . Essential hypertension, benign 06/28/2012  . Bipolar disorder, suspect Bipolar I 06/02/2012  . Insomnia 04/27/2012  . Panic anxiety syndrome 03/21/2012  . History of colostomy 03/21/2012  . History of alcohol abuse 10/29/2011  . History of diverticulitis of colon 10/16/2011  . Tobacco abuse disorder 10/16/2011    Medications- reviewed and updated Current Outpatient Prescriptions  Medication Sig Dispense Refill  . albuterol (PROVENTIL HFA;VENTOLIN HFA) 108 (90 BASE) MCG/ACT inhaler Inhale 2 puffs into the lungs every 6 (six) hours as needed for wheezing or shortness of breath.  1 Inhaler  2  . busPIRone (BUSPAR) 15 MG tablet Take 1 tablet (15 mg total) by mouth 4 (four) times daily.  120 tablet  5  . cyclobenzaprine (FLEXERIL) 10 MG tablet Take 1 tablet (10 mg total) by mouth 3 (three) times daily as needed for muscle spasms.  30 tablet  2  . diazepam (VALIUM) 5 MG tablet Take 1 tablet (5 mg total) by mouth 2 (two) times daily as needed for anxiety.  60 tablet  2  . lisinopril (PRINIVIL,ZESTRIL) 40 MG tablet Take 1 tablet (40 mg total) by mouth daily.  90 tablet  3  . lurasidone (LATUDA) 40 MG TABS tablet  Take 60 mg by mouth daily. Dr. Tammi Klippel authorized an increase in dose from 40 to 60 mg.  I called the pharmacy to make the change.      . Lurasidone HCl (LATUDA) 60 MG TABS Take 60 mg by mouth. Dr. Tammi Klippel authorized an increase in dose from 40 to 60 mg.  Called pharmacy to change the prescription.  #30 with five refills.      Marland Kitchen oxyCODONE-acetaminophen (PERCOCET) 10-325 MG per tablet Take 1 tablet by mouth every 8 (eight) hours as needed for pain (Pain control for Dental Procedure).  10 tablet  0   No current facility-administered medications for this visit.    Objective: BP 128/88  Pulse 114  Ht 5' 10.5" (1.791 m)  Wt 177 lb (80.287 kg)  BMI 25.03 kg/m2 Gen: NAD, alert, cooperative with exam HEENT: NCAT, EOMI, PERRL CV: RRR, good S1/S2, no murmur Resp: CTABL, no wheezes, non-labored Abd: SNTND, BS present, no guarding or organomegaly Ext: No edema, warm Neuro: Alert and oriented, No gross deficits   Assessment/Plan:  No problem-specific assessment & plan notes found for this encounter.   No orders of the defined types were placed in this encounter.    No orders of the defined types were placed in this encounter.

## 2014-02-21 NOTE — Patient Instructions (Signed)
It was great seeing you again. You were here to discuss your abdominal pain and future plans to manage this pain.  Our plans:  - Please try your best to stay motivated to do your Physical Therapy exercises at home. With a goal to perform 3 repetitions of each exercise every day (minimum 4 days a week) - Try to recognize a specific time of day which you are most motivated to perform these physical therapy activities. - Try to be cognizant of the pain medication you're taking, and understand that we will try to reduce this in the future. So any steps to take an instruction on your own are steps in the right direction.   I look forward to seeing you again in 3 weeks. Please schedule appointment for this time.

## 2014-02-21 NOTE — Assessment & Plan Note (Signed)
Patient has smoked since age of 63. He currently smokes 2 packs per day of cigarette 100s. Patient attempted to quit smoking on multiple occasions. He is currently not ready for smoking cessation. - Discussed with patient the availability I would have if he ever becomes ready to take a step forward towards quitting. - We discussed the possibility of reducing his cigarette use. Patient is currently not ready.

## 2014-02-21 NOTE — Assessment & Plan Note (Signed)
Patient still experiences chronic abdominal pain. Today was a 3/10 (baseline is 3-4/10). His history of abdominal surgery is extensive and places him at greater risk of chronic pain.  - Patient encouraged to continue his home PT regimen and to work toward a regular daily routine - 10 tabs of 10/325mg  Oxycodone prescribed for PRN pain - We discussed the possibility of reducing this pain medication as well as other medications over time. However I do not want to make any drastic changes at this time.

## 2014-02-24 ENCOUNTER — Telehealth (INDEPENDENT_AMBULATORY_CARE_PROVIDER_SITE_OTHER): Payer: Self-pay | Admitting: General Surgery

## 2014-02-24 ENCOUNTER — Telehealth: Payer: Self-pay | Admitting: Family Medicine

## 2014-02-24 NOTE — Telephone Encounter (Signed)
Pt thinks he has a hernia and has made an appt with his surgeon for 03/07/14. Patient requesting refill of oxycodone to last him until this appt. States he is in a lot of pain. Please advise.

## 2014-02-24 NOTE — Telephone Encounter (Signed)
Patient called in explaining that he is having some mild left sided abdominal pain with a knot that has popped up.  Explained that it appears when he does anything strenuous but it goes back down afterwards.  Denies fever, chills, N/V.  He is still having normal bowel movements.  Explained that the "knot" is present at his old ostomy site.  Informed him that he should come back in and see Dr. Marlou Starks due to the possibility of having a hernia at the ostomy site.  Made him an appt for 03/07/14 at 2:40.

## 2014-02-24 NOTE — Telephone Encounter (Signed)
Patient called in requesting refill for 10  Total oxycodone 10/325 tablets prescribed 3 days ago. On chart review, it appears as though he also requested pain medications from the general surgery office this morning as well. I will not be refilling his pain med prescription at this time. Will defer to the surgery team for acute pain management for the time being. Patient can follow up for an office visit as needed.  Algis Greenhouse. Jerline Pain, [facility] Resident PGY-1 02/24/2014 4:08 PM

## 2014-02-24 NOTE — Telephone Encounter (Signed)
Patient called back in wanting to know if her could have some pain medication to hold him over until his appt on 8/17 w/ Dr. Marlou Starks.  Informed him that he should be taking ibuprofen every 4-6 hrs as needed for pain and that he can use ice packs and hot compresses to help manage the pain.  Informed him that I would send this Rx request to Dr. Marlou Starks and that we would get back with him as soon as we heard something.  Patient verbalized understanding of this information.

## 2014-02-24 NOTE — Telephone Encounter (Signed)
Will forward to MD. Jazmin Hartsell,CMA  

## 2014-02-25 ENCOUNTER — Telehealth (INDEPENDENT_AMBULATORY_CARE_PROVIDER_SITE_OTHER): Payer: Self-pay

## 2014-02-25 NOTE — Telephone Encounter (Signed)
Per Sharyn Lull Dr Marlou Starks declines refill of pain med at this time. Pt advised.

## 2014-02-28 NOTE — Telephone Encounter (Signed)
Called pt to inform and he states that the surgery center never gave him a refill. Will forward to MD. Juluis Rainier pt has appt on 8/24. Morgan Rennert CMA

## 2014-03-01 ENCOUNTER — Encounter: Payer: Self-pay | Admitting: Psychology

## 2014-03-01 ENCOUNTER — Encounter: Payer: Self-pay | Admitting: Family Medicine

## 2014-03-01 ENCOUNTER — Ambulatory Visit (INDEPENDENT_AMBULATORY_CARE_PROVIDER_SITE_OTHER): Payer: Medicaid Other | Admitting: Family Medicine

## 2014-03-01 ENCOUNTER — Ambulatory Visit (INDEPENDENT_AMBULATORY_CARE_PROVIDER_SITE_OTHER): Payer: Medicaid Other | Admitting: Psychology

## 2014-03-01 VITALS — BP 152/88 | HR 86 | Temp 97.6°F | Resp 14 | Ht 70.5 in | Wt 179.0 lb

## 2014-03-01 DIAGNOSIS — K432 Incisional hernia without obstruction or gangrene: Secondary | ICD-10-CM

## 2014-03-01 DIAGNOSIS — R1011 Right upper quadrant pain: Secondary | ICD-10-CM

## 2014-03-01 DIAGNOSIS — G8929 Other chronic pain: Secondary | ICD-10-CM

## 2014-03-01 MED ORDER — IBUPROFEN 600 MG PO TABS: 600.0000 mg | ORAL_TABLET | Freq: Four times a day (QID) | ORAL | Status: DC | PRN

## 2014-03-01 MED ORDER — ACETAMINOPHEN 500 MG PO TABS: 1000.0000 mg | ORAL_TABLET | Freq: Three times a day (TID) | ORAL | Status: DC | PRN

## 2014-03-01 NOTE — Progress Notes (Signed)
[name], MD Phone: [phone]  Subjective:  Chief complaint  Pt Here for an acute onset abdominal pain. Patient states that he has had acute onset abdominal pain which is new. He states that it's approximately 4 days ago he was trying to lift a lawnmower into the back of a truck. Since that time he has had excruciating pain in his left lower quadrant near the site of his previous ostomy. He has scheduled an appointment with his surgeons for 8/17. Patient denies any changes in his bowels. Denies hematochezia or melena, nausea or vomiting.   12 point review of systems completed and negative unless stated above.  Past Medical History Patient Active Problem List   Diagnosis Date Noted  . Recent dental procedure 01/28/2014  . Dyspnea 12/24/2013  . Olecranon bursitis of left elbow 09/13/2013  . Grief 08/05/2013  . Acute bronchitis 07/26/2013  . Abdominal pain, chronic, diffuse 04/15/2013  . Akathisia 03/02/2013  . Hyperglycemia 03/02/2013  . Ventral hernia 01/11/2013  . Migraine headache 08/23/2012  . Essential hypertension, benign 06/28/2012  . Bipolar disorder, suspect Bipolar I 06/02/2012  . Insomnia 04/27/2012  . Panic anxiety syndrome 03/21/2012  . History of colostomy 03/21/2012  . History of alcohol abuse 10/29/2011  . History of diverticulitis of colon 10/16/2011  . Tobacco abuse disorder 10/16/2011    Medications- reviewed and updated Current Outpatient Prescriptions  Medication Sig Dispense Refill  . acetaminophen (TYLENOL) 500 MG tablet Take 2 tablets (1,000 mg total) by mouth every 8 (eight) hours as needed for moderate pain.  20 tablet  0  . albuterol (PROVENTIL HFA;VENTOLIN HFA) 108 (90 BASE) MCG/ACT inhaler Inhale 2 puffs into the lungs every 6 (six) hours as needed for wheezing or shortness of breath.  1 Inhaler  2  . busPIRone (BUSPAR) 15 MG tablet Take 1 tablet (15 mg total) by mouth 4 (four) times daily.  120 tablet  5  . cyclobenzaprine (FLEXERIL) 10 MG  tablet Take 1 tablet (10 mg total) by mouth 3 (three) times daily as needed for muscle spasms.  30 tablet  2  . diazepam (VALIUM) 5 MG tablet Take 1 tablet (5 mg total) by mouth 2 (two) times daily as needed for anxiety.  60 tablet  2  . ibuprofen (ADVIL,MOTRIN) 600 MG tablet Take 1 tablet (600 mg total) by mouth every 6 (six) hours as needed.  30 tablet  0  . lisinopril (PRINIVIL,ZESTRIL) 40 MG tablet Take 1 tablet (40 mg total) by mouth daily.  90 tablet  3  . lurasidone (LATUDA) 40 MG TABS tablet Take 60 mg by mouth daily. Dr. Tammi Klippel authorized an increase in dose from 40 to 60 mg.  I called the pharmacy to make the change.      . Lurasidone HCl (LATUDA) 60 MG TABS Take 60 mg by mouth. Dr. Tammi Klippel authorized an increase in dose from 40 to 60 mg.  Called pharmacy to change the prescription.  #30 with five refills.      Marland Kitchen oxyCODONE-acetaminophen (PERCOCET) 10-325 MG per tablet Take 1 tablet by mouth every 8 (eight) hours as needed for pain.  10 tablet  0   No current facility-administered medications for this visit.    Objective: BP 152/88  Pulse 86  Temp(Src) 97.6 F (36.4 C)  Resp 14  Ht 5' 10.5" (1.791 m)  Wt 179 lb (81.194 kg)  BMI 25.31 kg/m2 Gen: NAD, alert, cooperative with exam; patient appears extremely anxious HEENT: NCAT, EOMI, PERRL CV: RRR, good S1/S2,  no murmur Resp: CTABL, no wheezes, non-labored Abd: Soft, Tender at LLQ, Non Distended, BS present, no guarding or organomegaly; extensive midline scarring noted Ext: No edema, warm Neuro: Alert and oriented, No gross deficits   Assessment/Plan:  Abdominal pain, chronic, diffuse Patient was seen with acute on chronic abdominal pain. Pain was localized over his previous colostomy site (left lower quadrant). At this time bowel strangulation/ischemia unlikely due to patient's lack of hematochezia melena nausea vomiting or any change in bowel regimen. Due to the lifting nature of his initial inciting action I suspect  herniation over previous surgical site. Patient's status currently not emergent, patient deemed stable enough to control pain at home until his appointment with Gen. surgery on 8/17.  - Patient urged to take alternating ibuprofen and acetaminophen regimen. I was initially reluctant to prescribe ibuprofen due to risk of GI bleeds however due to recent general surgery recommendation short-term ibuprofen use may be beneficial. - Ibuprofen 600 mg Q6 when necessary - Acetaminophen 1 g Q8 when necessary, unless oxycodone is taken patient is then instructed to limit this to twice a day.  - Patient instructed to take himself to the emergency department immediately if he begins to experience any symptoms of fever chills nausea vomiting hematochezia melena or any swelling/erythematous changes of his abdomen.  Ventral hernia See "abdominal pain, chronic, diffuse"    No orders of the defined types were placed in this encounter.    Meds ordered this encounter  Medications  . ibuprofen (ADVIL,MOTRIN) 600 MG tablet    Sig: Take 1 tablet (600 mg total) by mouth every 6 (six) hours as needed.    Dispense:  30 tablet    Refill:  0  . acetaminophen (TYLENOL) 500 MG tablet    Sig: Take 2 tablets (1,000 mg total) by mouth every 8 (eight) hours as needed for moderate pain.    Dispense:  20 tablet    Refill:  0

## 2014-03-01 NOTE — Patient Instructions (Signed)
It was a pleasure seeing you today. Here is the plan that we agreed to:  - Attempts to use ice packs and/or heat packs at the site of pain for pain control. - Alternating use of ibuprofen and Tylenol for pain control until your appointment on 8/17 - I prescribed for you ibuprofen 600 mg to be taken every 6 hours (4 times per day) - Tylenol 1 g up to 3 times a day - If you begin to experience his symptoms we discussed like blood in your stool, black or tarry stools, nausea and vomiting, fevers, chills, red/swollen abdomen please seek treatment at the nearest emergency department immediately

## 2014-03-01 NOTE — Progress Notes (Signed)
Pt has an apt today and would like to know if he can get a refill on med.

## 2014-03-01 NOTE — Progress Notes (Signed)
Patient has same day with PCP today to discuss.

## 2014-03-01 NOTE — Assessment & Plan Note (Signed)
See "abdominal pain, chronic, diffuse"

## 2014-03-01 NOTE — Assessment & Plan Note (Addendum)
Patient was seen with acute on chronic abdominal pain. Pain was localized over his previous colostomy site (left lower quadrant). At this time bowel strangulation/ischemia unlikely due to patient's lack of hematochezia melena nausea vomiting or any change in bowel regimen. Due to the lifting nature of his initial inciting action I suspect herniation over previous surgical site. Patient's status currently not emergent, patient deemed stable enough to control pain at home until his appointment with Gen. surgery on 8/17.  - Patient urged to take alternating ibuprofen and acetaminophen regimen. I was initially reluctant to prescribe ibuprofen due to risk of GI bleeds however due to recent general surgery recommendation short-term ibuprofen use may be beneficial. - Ibuprofen 600 mg Q6 when necessary - Acetaminophen 1 g Q8 when necessary, unless oxycodone is taken patient is then instructed to limit this to twice a day.  - Patient instructed to take himself to the emergency department immediately if he begins to experience any symptoms of fever chills nausea vomiting hematochezia melena or any swelling/erythematous changes of his abdomen.

## 2014-03-01 NOTE — Progress Notes (Signed)
Timothy [name] had an appointment this morning with me but was in significant pain in his belly.  He elected to see his PCP and afterward, I met with him for 10 minutes or so.  In addition to his increased abdominal pain, he is feeling really sad lately about his father.  He was supposed to go to [facility] this summer to visit the grave and see his dad's wife but plans fell through.  The loss of this opportunity could be playing a role.  Discussed.  He increased his Latuda per our last conversation.  Tolerating it well.  Does not notice a significant change.  Reminded him that the less he does, the less he will feel like doing and that it may be even harder to dig himself out eventually.  Will see in [place] a week from Wednesday.

## 2014-03-07 ENCOUNTER — Ambulatory Visit (INDEPENDENT_AMBULATORY_CARE_PROVIDER_SITE_OTHER): Payer: Medicaid Other | Admitting: General Surgery

## 2014-03-07 ENCOUNTER — Encounter (INDEPENDENT_AMBULATORY_CARE_PROVIDER_SITE_OTHER): Payer: Self-pay | Admitting: General Surgery

## 2014-03-07 VITALS — BP 124/72 | HR 88 | Resp 14 | Ht 70.0 in | Wt 179.6 lb

## 2014-03-07 DIAGNOSIS — R1011 Right upper quadrant pain: Secondary | ICD-10-CM

## 2014-03-07 DIAGNOSIS — G8929 Other chronic pain: Secondary | ICD-10-CM

## 2014-03-07 NOTE — Progress Notes (Signed)
Patient ID: Timothy [name], male   DOB: 08/20/1965, 48 y.o.   MRN: [id number]  No chief complaint on file.   HPI Timothy [name] is a 48 y.o. male.  The patient is a 48 year old white male who is about 10 months status post open ventral hernia repair with mesh. He seemed to be doing well but recently tried to pick up a push mower and developed some pain on the left side of his abdomen. He did not feel anything pop. He has had no nausea or vomiting. His appetite has been good and his bowels are working normally. Since this episode his discomfort seems to have gone away and then not he felt on the left side of his abdomen has gone away as well HPI  Past Medical History  Diagnosis Date  . Hypoglycemia   . GERD (gastroesophageal reflux disease)   . Diverticulosis     By colonoscopy June 2005  . Lower GI bleed     June 2005. Presumably secondary to diverticulosis.  . Anxiety   . Alcohol abuse   . Hemorrhoids   . Family history of anesthesia complication     Mother N/V  . Bronchitis     history  . Cellulitis     - left knee-2009  . Glaucoma syndrome     does not use eye drops, "They burn".  . Elevated CK 3/13  . Depression   . Incisional hernia   . Prinzmetal angina   . History of stomach ulcers 2005  . Headache(784.0)     "alot; not daily" (07/23/2012)  . Migraines     "not often" (07/23/2012)  . Condyloma 02/04/2012  . Acute bronchitis 02/15/2013  . Hypertension     started 3 months ago  . Bronchitis   . History of dizziness   . Emphysema lung   . Emphysema of lung     Past Surgical History  Procedure Laterality Date  . Appendectomy  1982  . Elbow fracture surgery  ~ 1975    left;  pins inserted   . Wisdom tooth extraction  ~ 1983  . Laparoscopic left colon resection  02/19/2012    SIGMOID  . Wart fulguration  02/19/2012    Procedure: FULGURATION ANAL WART;  Surgeon: Merrie Roof, MD;  Location: [place];  Service: General;  Laterality: N/A;  Destroy  Anal Condyloma   .  Laparotomy  02/27/2012    Procedure: EXPLORATORY LAPAROTOMY;  Surgeon: Merrie Roof, MD;  Location: [place];  Service: General;  Laterality: N/A;  . Colostomy  02/27/2012    Procedure: COLOSTOMY;  Surgeon: Merrie Roof, MD;  Location: [place];  Service: General;  Laterality: N/A;  . Colon resection  02/27/2012    Procedure: COLON RESECTION;  Surgeon: Merrie Roof, MD;  Location: [place];  Service: General;  Laterality: N/A;  . Colostomy takedown  07/23/2012  . Abdominal exploration surgery  07/23/2012    w/LOA (07/23/2012)  . Colostomy takedown  07/23/2012    Procedure: COLOSTOMY TAKEDOWN;  Surgeon: Merrie Roof, MD;  Location: [place];  Service: General;  Laterality: N/A;  Primary Anastomosis  . Lesion destruction  07/23/2012    Procedure: DESTRUCTION LESION ANUS;  Surgeon: Merrie Roof, MD;  Location: [place];  Service: General;  Laterality: N/A;  DESTRUCTION ANAL CONDYLOMA  . Lysis of adhesion  07/23/2012    Procedure: LYSIS OF ADHESION;  Surgeon: Merrie Roof, MD;  Location:  [place] [facility];  Service: General;  Laterality: N/A;  . Laparotomy  07/23/2012    Procedure: EXPLORATORY LAPAROTOMY;  Surgeon: Merrie Roof, MD;  Location: [place];  Service: General;  Laterality: N/A;  . Ventral hernia repair  05/20/2013    Dr Marlou Starks  . Ventral hernia repair N/A 05/20/2013    Procedure: VENTRAL HERNIA REPAIR ;  Surgeon: Merrie Roof, MD;  Location: [place];  Service: General;  Laterality: N/A;  . Insertion of mesh N/A 05/20/2013    Procedure: INSERTION OF MESH;  Surgeon: Merrie Roof, MD;  Location: [place];  Service: General;  Laterality: N/A;    Family History  Problem Relation Age of Onset  . Diabetes Mother   . Parkinsonism Mother   . Depression Mother   . Alcohol abuse Father   . Hypertension Father   . Breast cancer Maternal Aunt   . Stroke Neg Hx   . Heart disease Neg Hx   . Anxiety disorder Father   . Ulcers Father   . Colon polyps Father   . Diabetes Mother   . Diabetes Maternal Grandmother      Social History History  Substance Use Topics  . Smoking status: Current Every Day Smoker -- 1.00 packs/day for 31 years    Types: Cigarettes    Start date: 07/22/1981  . Smokeless tobacco: Former Systems developer    Types: Chew     Comment: 06/25/2013 "raely uses dip"/Previous 2 PPD smoker  . Alcohol Use: No     Comment: h/o 24 PK/ WEEKEND; 07/23/2012 "sober since 02/12/2013"    Allergies  Allergen Reactions  . Ambien [Zolpidem Tartrate] Other (See Comments)    Caused agitation of mood.   [name] [Propoxyphene N-Acetaminophen] Hives and Itching  . Morphine And Related Itching  . Bactroban [Mupirocin Calcium] Swelling    Caused infection at a surgical site    Current Outpatient Prescriptions  Medication Sig Dispense Refill  . acetaminophen (TYLENOL) 500 MG tablet Take 2 tablets (1,000 mg total) by mouth every 8 (eight) hours as needed for moderate pain.  20 tablet  0  . albuterol (PROVENTIL HFA;VENTOLIN HFA) 108 (90 BASE) MCG/ACT inhaler Inhale 2 puffs into the lungs every 6 (six) hours as needed for wheezing or shortness of breath.  1 Inhaler  2  . busPIRone (BUSPAR) 15 MG tablet Take 1 tablet (15 mg total) by mouth 4 (four) times daily.  120 tablet  5  . cyclobenzaprine (FLEXERIL) 10 MG tablet Take 1 tablet (10 mg total) by mouth 3 (three) times daily as needed for muscle spasms.  30 tablet  2  . diazepam (VALIUM) 5 MG tablet Take 1 tablet (5 mg total) by mouth 2 (two) times daily as needed for anxiety.  60 tablet  2  . ibuprofen (ADVIL,MOTRIN) 600 MG tablet Take 1 tablet (600 mg total) by mouth every 6 (six) hours as needed.  30 tablet  0  . lisinopril (PRINIVIL,ZESTRIL) 40 MG tablet Take 1 tablet (40 mg total) by mouth daily.  90 tablet  3  . Lurasidone HCl (LATUDA) 60 MG TABS Take 60 mg by mouth. Dr. Tammi Klippel authorized an increase in dose from 40 to 60 mg.  Called pharmacy to change the prescription.  #30 with five refills.      Marland Kitchen oxyCODONE-acetaminophen (PERCOCET) 10-325 MG per tablet  Take 1 tablet by mouth every 8 (eight) hours as needed for pain.  10 tablet  0   No current facility-administered  medications for this visit.    Review of Systems Review of Systems  Constitutional: Negative.   HENT: Negative.   Eyes: Negative.   Respiratory: Negative.   Cardiovascular: Negative.   Gastrointestinal: Negative.   Endocrine: Negative.   Genitourinary: Negative.   Musculoskeletal: Negative.   Skin: Negative.   Allergic/Immunologic: Negative.   Neurological: Negative.   Hematological: Negative.   Psychiatric/Behavioral: Negative.     Blood pressure 124/72, pulse 88, resp. rate 14, height 5\' 10"  (1.778 m), weight 179 lb 9.6 oz (81.466 kg).  Physical Exam Physical Exam  Constitutional: He is oriented to person, place, and time. He appears well-developed and well-nourished.  HENT:  Head: Normocephalic and atraumatic.  Eyes: Conjunctivae and EOM are normal. Pupils are equal, round, and reactive to light.  Neck: Normal range of motion. Neck supple.  Cardiovascular: Normal rate, regular rhythm and normal heart sounds.   Pulmonary/Chest: Effort normal and breath sounds normal.  Abdominal: Soft. Bowel sounds are normal.  There is a well-healed midline scar. His abdominal wall feels solid with no palpable evidence of hernia. There is mild tenderness associated with his scar.  Musculoskeletal: Normal range of motion.  Neurological: He is alert and oriented to person, place, and time.  Skin: Skin is warm and dry.  Psychiatric: He has a normal mood and affect. His behavior is normal.    Data Reviewed As above  Assessment    The patient is 10 months status post open ventral hernia repair with mesh. He recently did some heavy lifting and developed some discomfort on the left side of his abdomen. Clinically I do not palpate any evidence for recurrence of the hernia.     Plan    At this point I have offered to get a CT scan of his abdomen to look for any evidence of  recurrent hernia. Clinically his abdominal wall feels very solid and I would not expect this to show anything. Since he is feeling better he would prefer to monitor his discomfort. He will call us if he has any problems in the future.        TOTH III,Evelise Reine S 03/07/2014, 3:13 PM

## 2014-03-07 NOTE — Patient Instructions (Signed)
Heat and anti inflammatories

## 2014-03-09 ENCOUNTER — Ambulatory Visit (INDEPENDENT_AMBULATORY_CARE_PROVIDER_SITE_OTHER): Payer: Medicaid Other | Admitting: Psychology

## 2014-03-09 DIAGNOSIS — F319 Bipolar disorder, unspecified: Secondary | ICD-10-CM

## 2014-03-09 NOTE — Patient Instructions (Signed)
Please schedule a follow-up to see Dr. Gwenlyn Saran for:  September 3rd at 10:00. Please schedule a follow-up for [facility] for:  October 7th at 9:00.   Anxiety and irritability is often your body's way of saying something needs to change.  The situation with [name] needs to be addressed.  You committed to calling to get reconnected with [facility].  Family Counseling (to learn ways to manage your reaction).   Writing down a number between 0 and 3 to track your irritability until we see you next would be really helpful. With the Latuda - going to decrease to 40 mg to see if that makes your irritability better.

## 2014-03-09 NOTE — Assessment & Plan Note (Signed)
Report of mood is irritable.  He appears restless.  Dr. Tammi Klippel thought that Latuda at 60 mg could be producing side effects like akathesia.  Discussed going down to see if a sense of restlessness gets better.  Timothy [name] agreed with this.  Also discussed getting support around [place] through counseling, which has been helpful in the past.  He agreed that this would be useful.    He is not interested in decreasing smoking.  Did not address other behavioral issues today (caffeine use, structure).  These have been addressed in the past and he has not been interested in making a change.  Will address again during next therapy appointment.    See patient instructions for further plan.    30 minutes of face to face counseling today.

## 2014-03-09 NOTE — Progress Notes (Signed)
Timothy [name] presents for follow-up.  He doesn't have anything for the agenda but reports that over the last week or so he has noticed a significant increase in anxiety and irritability to the point where he feels like he wants to blow.  A known trigger is his son who is behaving in ways that are not typical for him.  His argumentative and oppositional behavior coupled with a sleep schedule that is not compatible with school (which starts on Monday) is stressful for Timothy [name].  Taking Latuda 60 mg, Buspar 15 mg four times a day, Valium 2 pills daily.

## 2014-03-14 ENCOUNTER — Encounter: Payer: Self-pay | Admitting: Family Medicine

## 2014-03-14 ENCOUNTER — Ambulatory Visit (INDEPENDENT_AMBULATORY_CARE_PROVIDER_SITE_OTHER): Payer: Medicaid Other | Admitting: Family Medicine

## 2014-03-14 VITALS — BP 132/67 | HR 88 | Temp 98.2°F | Ht 70.0 in | Wt 177.0 lb

## 2014-03-14 DIAGNOSIS — R109 Unspecified abdominal pain: Secondary | ICD-10-CM

## 2014-03-14 DIAGNOSIS — N529 Male erectile dysfunction, unspecified: Secondary | ICD-10-CM | POA: Insufficient documentation

## 2014-03-14 DIAGNOSIS — F411 Generalized anxiety disorder: Secondary | ICD-10-CM

## 2014-03-14 DIAGNOSIS — R0989 Other specified symptoms and signs involving the circulatory and respiratory systems: Secondary | ICD-10-CM

## 2014-03-14 DIAGNOSIS — R0600 Dyspnea, unspecified: Secondary | ICD-10-CM

## 2014-03-14 DIAGNOSIS — R0609 Other forms of dyspnea: Secondary | ICD-10-CM

## 2014-03-14 DIAGNOSIS — F410 Panic disorder [episodic paroxysmal anxiety] without agoraphobia: Secondary | ICD-10-CM

## 2014-03-14 DIAGNOSIS — G8929 Other chronic pain: Secondary | ICD-10-CM

## 2014-03-14 DIAGNOSIS — R1011 Right upper quadrant pain: Secondary | ICD-10-CM

## 2014-03-14 MED ORDER — OXYCODONE-ACETAMINOPHEN 5-325 MG PO TABS: 1.0000 | ORAL_TABLET | Freq: Three times a day (TID) | ORAL | Status: DC | PRN

## 2014-03-14 MED ORDER — SILDENAFIL CITRATE 100 MG PO TABS: 50.0000 mg | ORAL_TABLET | Freq: Every day | ORAL | Status: DC | PRN

## 2014-03-14 NOTE — Patient Instructions (Signed)
It was a pleasure seeing you again today. Here is the plan that we discussed and agreed upon:  - You are to contact your general surgeon if you continue to have increased abdominal pain nausea vomiting or diarrhea. - I prescribed for you Percocet 5/325, #20 tablets for the next month. These are to last you until the 24th of next month. - I have handed you a prescription for #10 tablets of Viagra. This is not covered by Medicaid so I recommend shopping around for the best price. - Please make an appointment with Dr. Valentina Lucks for further breathing tests and medical management. - I prescribed for you an additional 10 tablets of diazepam this month to be taken when necessary on high anxiety days. No more than 3 tablets per day.

## 2014-03-14 NOTE — Assessment & Plan Note (Signed)
Patient states his pain is adequately controlled at this time. He's been seen by general surgery recently do to the exacerbation of this pain from lifting a lawnmower into the back of his truck. They left him with instructions to see them back if his pain persists or continues to the end of the week. At this time I have written him his typical monthly prescription for Percocet. At this time we are executing the plan to begin the weaning process off of this medication. I have changed this prescription from [place] 10/325 #10 tablets, to Percocet 5/325 #20 tablets. This change is of equal narcotic amounts however patient has been instructed to do his best to minimize the amount of medications he takes per day. Patient states that he is very much on board with this plan.  - At our next appointment we are to address how well he did attempting to maintain adequate pain control while also being mindful and attempting to reduce his total intake.

## 2014-03-14 NOTE — Progress Notes (Signed)
Reviewed

## 2014-03-14 NOTE — Assessment & Plan Note (Signed)
Patient stated that he been experiencing issues with obtaining and maintaining an erection over the past year. Patient asked if he would benefit from a medication. - Prescription for 50 mg Viagra when necessary written for patient and printed. Patient informed that Medicare does not cover this medication. Patient was instructed to "shop around" for the most reasonable price at pharmacies. - Risk factors and adverse effects discussed with the patient as well as some areas which would require him to contact either the office or report to the nearest emergency department.

## 2014-03-14 NOTE — Assessment & Plan Note (Signed)
Patient states that he is been experiencing more anxiety lately and has noticed that he feels like it would help if he took 3 pills (15 mg) diazepam in a day however he has not yet done this. Patient is asking if it would be okay to increase his dosage. I discussed with him that diazepam is not an ideal chronic medication for anxiety. I discussed with him the possible option of taking a longer acting anxiolytic. Patient understands this - At this time I do not feel it necessary to alter the patient's current anxiolytic medication regimen due to the other changes we are making in his medication list. I discussed with him that at our next visit I would address this and we would come to an agreement on a future change. - I gave patient a green light to take up to 15 mg per day when necessary. I did not make any additional changes to his current prescription. Anticipation for patient's diazepam prescription to require refills sooner than the previous anticipated date should be considered. (Only if patient is continued on this current medication) - Future changes could include discontinuing diazepam with addition of Effexor, Paxil, or other.

## 2014-03-14 NOTE — Progress Notes (Signed)
Patient ID: Timothy [name], male   DOB: 06-Apr-1966, 48 y.o.   MRN: [id number]  Timothy Lynch, MD, MS Phone: [phone]  Subjective:  Chief complaint  Pt Here for pain and anxiety management followup. Patient presents today with his normal three-week followup appointment to discuss his current pain management. Patient states his pain has been a little worse lately do to aggravation he had experienced earlier this month when attempting to lift a lawnmower into the back of his truck. He has been seen by general surgery, they believe that he is currently stable however they left him with instructions to contact them if pain persists or worsens.   Patient states that he is ready to begin the narcotic weaning process that we have discussed at the last visit. He also states that he's been having some increased anxiety of late. And is asking if I would be willing to increase some of his diazepam medication. Lastly he does report some issues with erectile dysfunction. And is interested in attempting a trial in ED medical management.   Review of Systems  Constitutional: Negative for fever, chills and weight loss.  Eyes: Negative for blurred vision.  Respiratory: Negative for cough.   Cardiovascular: Negative for chest pain and palpitations.  Gastrointestinal: Positive for abdominal pain. Negative for heartburn, nausea, vomiting, diarrhea, blood in stool and melena.  Genitourinary: Negative for dysuria and frequency.  Musculoskeletal: Negative for falls.  Skin: Negative for itching and rash.  Neurological: Positive for tremors. Negative for dizziness, seizures, loss of consciousness, weakness and headaches.  Psychiatric/Behavioral: Negative for depression, suicidal ideas and hallucinations.     Past Medical History Patient Active Problem List   Diagnosis Date Noted  . Erectile dysfunction 03/14/2014  . Recent dental procedure 01/28/2014  . Dyspnea 12/24/2013  . Olecranon bursitis of left elbow  09/13/2013  . Grief 08/05/2013  . Acute bronchitis 07/26/2013  . Abdominal pain, chronic, diffuse 04/15/2013  . Akathisia 03/02/2013  . Hyperglycemia 03/02/2013  . Ventral hernia 01/11/2013  . Migraine headache 08/23/2012  . Essential hypertension, benign 06/28/2012  . Bipolar disorder, suspect Bipolar I 06/02/2012  . Insomnia 04/27/2012  . Panic anxiety syndrome 03/21/2012  . History of colostomy 03/21/2012  . History of alcohol abuse 10/29/2011  . History of diverticulitis of colon 10/16/2011  . Tobacco abuse disorder 10/16/2011    Medications- reviewed and updated Current Outpatient Prescriptions  Medication Sig Dispense Refill  . acetaminophen (TYLENOL) 500 MG tablet Take 2 tablets (1,000 mg total) by mouth every 8 (eight) hours as needed for moderate pain.  20 tablet  0  . albuterol (PROVENTIL HFA;VENTOLIN HFA) 108 (90 BASE) MCG/ACT inhaler Inhale 2 puffs into the lungs every 6 (six) hours as needed for wheezing or shortness of breath.  1 Inhaler  2  . busPIRone (BUSPAR) 15 MG tablet Take 1 tablet (15 mg total) by mouth 4 (four) times daily.  120 tablet  5  . cyclobenzaprine (FLEXERIL) 10 MG tablet Take 1 tablet (10 mg total) by mouth 3 (three) times daily as needed for muscle spasms.  30 tablet  2  . diazepam (VALIUM) 5 MG tablet Take 1 tablet (5 mg total) by mouth 2 (two) times daily as needed for anxiety.  60 tablet  2  . ibuprofen (ADVIL,MOTRIN) 600 MG tablet Take 1 tablet (600 mg total) by mouth every 6 (six) hours as needed.  30 tablet  0  . lisinopril (PRINIVIL,ZESTRIL) 40 MG tablet Take 1 tablet (40 mg total) by mouth daily.  90 tablet  3  . Lurasidone HCl (LATUDA) 60 MG TABS Take 40 mg by mouth. Decreased to 40 mg after a trial at 60 mg.  Might have been causing increased irritability.  #30 with 5 refills.      Marland Kitchen oxyCODONE-acetaminophen (ROXICET) 5-325 MG per tablet Take 1 tablet by mouth every 8 (eight) hours as needed for severe pain.  20 tablet  0  . sildenafil (VIAGRA)  100 MG tablet Take 0.5 tablets (50 mg total) by mouth daily as needed for erectile dysfunction.  5 tablet  11   No current facility-administered medications for this visit.    Objective: BP 132/67  Pulse 88  Temp(Src) 98.2 F (36.8 C) (Oral)  Ht 5\' 10"  (1.778 m)  Wt 177 lb (80.287 kg)  BMI 25.40 kg/m2 Gen: NAD, alert, cooperative with exam HEENT: NCAT, EOMI, PERRL CV: RRR, good S1/S2, no murmur Resp: CTABL, no wheezes, non-labored Abd: Soft, Tender throughout, Non Distended, BS present, no guarding or organomegaly. Extensive surgical scars noted throughout. Ext: No edema, warm Neuro: Alert and oriented, No gross deficits   Assessment/Plan:  Abdominal pain, chronic, diffuse Patient states his pain is adequately controlled at this time. He's been seen by general surgery recently do to the exacerbation of this pain from lifting a lawnmower into the back of his truck. They left him with instructions to see them back if his pain persists or continues to the end of the week. At this time I have written him his typical monthly prescription for Percocet. At this time we are executing the plan to begin the weaning process off of this medication. I have changed this prescription from [place] 10/325 #10 tablets, to Percocet 5/325 #20 tablets. This change is of equal narcotic amounts however patient has been instructed to do his best to minimize the amount of medications he takes per day. Patient states that he is very much on board with this plan.  - At our next appointment we are to address how well he did attempting to maintain adequate pain control while also being mindful and attempting to reduce his total intake.  Panic anxiety syndrome Patient states that he is been experiencing more anxiety lately and has noticed that he feels like it would help if he took 3 pills (15 mg) diazepam in a day however he has not yet done this. Patient is asking if it would be okay to increase his dosage. I  discussed with him that diazepam is not an ideal chronic medication for anxiety. I discussed with him the possible option of taking a longer acting anxiolytic. Patient understands this - At this time I do not feel it necessary to alter the patient's current anxiolytic medication regimen due to the other changes we are making in his medication list. I discussed with him that at our next visit I would address this and we would come to an agreement on a future change. - I gave patient a green light to take up to 15 mg per day when necessary. I did not make any additional changes to his current prescription. Anticipation for patient's diazepam prescription to require refills sooner than the previous anticipated date should be considered. (Only if patient is continued on this current medication) - Future changes could include discontinuing diazepam with addition of Effexor, Paxil, or other.   Dyspnea Patient asked what actions need to be taken in order to see Dr. Valentina Lucks for respiratory management again. He was instructed to make a followup appointment  with him at the front desk after his appointment today. - Dr. Graylin Shiver expertise is always appreciated, thank you.  Erectile dysfunction Patient stated that he been experiencing issues with obtaining and maintaining an erection over the past year. Patient asked if he would benefit from a medication. - Prescription for 50 mg Viagra when necessary written for patient and printed. Patient informed that Medicare does not cover this medication. Patient was instructed to "shop around" for the most reasonable price at pharmacies. - Risk factors and adverse effects discussed with the patient as well as some areas which would require him to contact either the office or report to the nearest emergency department.    No orders of the defined types were placed in this encounter.    Meds ordered this encounter  Medications  . oxyCODONE-acetaminophen (ROXICET) 5-325  MG per tablet    Sig: Take 1 tablet by mouth every 8 (eight) hours as needed for severe pain.    Dispense:  20 tablet    Refill:  0  . sildenafil (VIAGRA) 100 MG tablet    Sig: Take 0.5 tablets (50 mg total) by mouth daily as needed for erectile dysfunction.    Dispense:  5 tablet    Refill:  11

## 2014-03-14 NOTE — Assessment & Plan Note (Signed)
Patient asked what actions need to be taken in order to see Dr. Valentina Lucks for respiratory management again. He was instructed to make a followup appointment with him at the front desk after his appointment today. - Dr. Graylin Shiver expertise is always appreciated, thank you.

## 2014-03-24 ENCOUNTER — Ambulatory Visit (INDEPENDENT_AMBULATORY_CARE_PROVIDER_SITE_OTHER): Payer: Medicaid Other | Admitting: Psychology

## 2014-03-24 DIAGNOSIS — F17200 Nicotine dependence, unspecified, uncomplicated: Secondary | ICD-10-CM

## 2014-03-24 DIAGNOSIS — F319 Bipolar disorder, unspecified: Secondary | ICD-10-CM

## 2014-03-24 DIAGNOSIS — Z720 Tobacco use: Secondary | ICD-10-CM

## 2014-03-24 NOTE — Progress Notes (Signed)
Timothy [name] presents for follow-up.  He reports that since decreasing to 40 mg of Latuda (from 60 mg), he has been feeling less irritable.  He hasn't tracked his mood in his journal but plans on starting soon.  He has not yet followed up with counseling for [facility] because of other doctor's appointments with him.  Things have calmed down a bit though and he is less triggered.  Continues to smoke and drink a significant amount of caffeine.  Big news:  He plans on getting a 1 week old Boxer puppy in 3 weeks.  It is coming from a friend and he has looked at the financial issues.  He thinks it will be good for him - allowing him to focus on something and giving him someone to "talk to."  He states multiple times that he is excited.

## 2014-03-24 NOTE — Assessment & Plan Note (Signed)
He really seems to have a good sense of the drawbacks of continued smoking.  He knows COPD is around the corner and recognizes that oxygen may eventually need to be used to breathe.  His view:  Smoking calms me down.  For him the benefit of smoking out weighs the costs.  Talked about the potential for regret later down the line and especially because [name] is so dependent on him.  He seemed to have as good a sense of this as one can when he isn't in it.  Told him I would continue to bring it up to check if his motivation changes.

## 2014-03-24 NOTE — Assessment & Plan Note (Signed)
He has noticed decreased irritability since backing off on Latuda to 40 mg.  Symptoms seem reasonably well controlled.  He is reporting excitement.  Still does a fair amount of sitting / television watching.  He does report he has been "getting out more."  Is not interested in other behavioral changes presently.    Agree the dog has the potential to add to his life.  He has experience with dogs.  ? Financial commitment but he has thought about this and worked out some details.

## 2014-04-04 ENCOUNTER — Ambulatory Visit: Payer: Medicaid Other | Admitting: Psychology

## 2014-04-04 ENCOUNTER — Telehealth: Payer: Self-pay | Admitting: Psychology

## 2014-04-04 NOTE — Telephone Encounter (Signed)
Timothy [name] called early this morning to cancel his appointment stating he wasn't feeling well.  He said he would call back to reschedule.

## 2014-04-11 ENCOUNTER — Ambulatory Visit
Admission: RE | Admit: 2014-04-11 | Discharge: 2014-04-11 | Disposition: A | Discharge status: Home / Self Care | Payer: Medicaid Other | Source: Ambulatory Visit | Attending: Family Medicine | Admitting: Family Medicine

## 2014-04-11 ENCOUNTER — Encounter: Payer: Self-pay | Admitting: Family Medicine

## 2014-04-11 ENCOUNTER — Ambulatory Visit (INDEPENDENT_AMBULATORY_CARE_PROVIDER_SITE_OTHER): Payer: Medicaid Other | Admitting: Family Medicine

## 2014-04-11 VITALS — BP 138/81 | HR 106 | Temp 98.0°F | Ht 71.0 in | Wt 176.9 lb

## 2014-04-11 DIAGNOSIS — R1011 Right upper quadrant pain: Secondary | ICD-10-CM

## 2014-04-11 DIAGNOSIS — R0609 Other forms of dyspnea: Secondary | ICD-10-CM

## 2014-04-11 DIAGNOSIS — J441 Chronic obstructive pulmonary disease with (acute) exacerbation: Secondary | ICD-10-CM

## 2014-04-11 DIAGNOSIS — R0989 Other specified symptoms and signs involving the circulatory and respiratory systems: Secondary | ICD-10-CM

## 2014-04-11 DIAGNOSIS — G8929 Other chronic pain: Secondary | ICD-10-CM

## 2014-04-11 DIAGNOSIS — R0600 Dyspnea, unspecified: Secondary | ICD-10-CM

## 2014-04-11 MED ORDER — PREDNISONE 50 MG PO TABS: 50.0000 mg | ORAL_TABLET | Freq: Every day | ORAL | Status: DC

## 2014-04-11 MED ORDER — AZITHROMYCIN 250 MG PO TABS: ORAL_TABLET | ORAL | Status: DC

## 2014-04-11 MED ORDER — OXYCODONE-ACETAMINOPHEN 5-325 MG PO TABS: 1.0000 | ORAL_TABLET | Freq: Three times a day (TID) | ORAL | Status: DC | PRN

## 2014-04-11 NOTE — Assessment & Plan Note (Signed)
Patient complaining of dyspnea. He states that he has been experiencing this for the past week. Patient does not appear to be in any acute distress. He states that he did cough up some brown sputum over the past week. He also experienced some posttussive heaving.  - Chest x-ray (2 view) to rule out any mass or pneumonia - Azithromycin five-day course (250 mg) - Prednisone Dosepak 50 mg x5 days  Patient has an appointment for a spirometry evaluation with Dr. Valentina Lucks tomorrow 04/11/2014. I asked that he obtain the chest x-ray prior to this appointment.

## 2014-04-11 NOTE — Assessment & Plan Note (Signed)
Patient states that his pain has been fairly well controlled. He says that today is one of his bad days and he does not know why, but with the exception of today he has been feeling quite well. Last appointment he and I have begun the transition period to begin weaning him from his narcotic medications. We switched from oxycodone 10 mg tablets x10 to oxycodone 5 mg tablets x20 per month. Patient states that this is worked out fairly well and he was able to take 7.5 mg on "bad days" instead of 10 mg. He was able to make this prescription last all month and he continues to be on board with our goals to slowly wean him from this medication.  With that said we are continuing the same prescription dosage and tablet amounts this month with hopes to decrease the amount of tablets next month.

## 2014-04-11 NOTE — Patient Instructions (Signed)
It was a please seeing you today in our clinic. Today we discussed your pain, and your breathing. Here is the treatment plan we have discussed and agreed upon together:  - continue current pain management regimen. - please obtain chest x-ray - prednisone dose pack has been ordered - Z-pack has been ordered  Take these medications as prescribed and as discussed in the clinic.

## 2014-04-11 NOTE — Progress Notes (Signed)
Patient ID: Timothy [name], male   DOB: 08/12/1965, 48 y.o.   MRN: [id number]  Timothy Lynch, MD, MS Phone: [phone]  Subjective:  Chief complaint: Followup  Pt Here for regular followup and medication refill.  Patient states that he is been fairly well controlled in his pain of late. He states that although last appointment he was complaining of increasing anxiety this is been also very well controlled over the past 4 weeks. Patient does not appear to be in any acute distress. Today's complaining mostly of dyspnea. This is been going on for approximately one week he states that he is been coughing up brownish sputum and using his inhaler more frequently (up to 4 times per day).  He also experienced some posttussive heaving. He states that he believes his inhaler is becoming slightly less effective do to this increased use.   Patient has an appointment with Dr. Valentina [name] for respiratory management tomorrow 04/11/2014. I have ordered a chest x-ray for him to have taken prior to this appointment. Also I've ordered a Z-Pak and a prednisone Dosepak for him to begin taking later today.  Patient's pain management medications were slightly altered and her last appointment. We changed his usual prescription from oxycodone 10 mg x10 to oxycodone 5 mg x20. This is ultimately the same dosage of narcotic however it was our hope to provide incentive for him to take less overall narcotic throughout his month. Patient states that he has done well however he is needed to take approximately the same amount of narcotic to get him through his month. He still continues to be positive about our plan and agrees to continue our course.  Patient did have some vague complaints of nocturia as well some increase frequency/hesitancy. He and I have agreed to perform a prostate exam at our next visit. If patient is found to have BPH consider Cialis as a treatment plan.  Review of Systems  Constitutional: Negative for fever, chills and  malaise/fatigue.  HENT: Negative for sore throat.   Eyes: Negative for blurred vision.  Respiratory: Positive for cough, sputum production and shortness of breath.   Cardiovascular: Negative for chest pain.  Gastrointestinal: Positive for abdominal pain. Negative for heartburn, nausea and vomiting.  Genitourinary: Positive for frequency. Negative for dysuria, urgency and flank pain.  Neurological: Negative for dizziness and headaches.  Psychiatric/Behavioral: Negative for depression, suicidal ideas and substance abuse. The patient is nervous/anxious.      Past Medical History Patient Active Problem List   Diagnosis Date Noted  . Erectile dysfunction 03/14/2014  . Dyspnea 12/24/2013  . Olecranon bursitis of left elbow 09/13/2013  . Grief 08/05/2013  . Acute bronchitis 07/26/2013  . Abdominal pain, chronic, diffuse 04/15/2013  . Akathisia 03/02/2013  . Hyperglycemia 03/02/2013  . Ventral hernia 01/11/2013  . Migraine headache 08/23/2012  . Essential hypertension, benign 06/28/2012  . Bipolar disorder, suspect Bipolar I 06/02/2012  . Insomnia 04/27/2012  . Panic anxiety syndrome 03/21/2012  . History of colostomy 03/21/2012  . History of alcohol abuse 10/29/2011  . History of diverticulitis of colon 10/16/2011  . Tobacco abuse disorder 10/16/2011    Medications- reviewed and updated Current Outpatient Prescriptions  Medication Sig Dispense Refill  . acetaminophen (TYLENOL) 500 MG tablet Take 2 tablets (1,000 mg total) by mouth every 8 (eight) hours as needed for moderate pain.  20 tablet  0  . albuterol (PROVENTIL HFA;VENTOLIN HFA) 108 (90 BASE) MCG/ACT inhaler Inhale 2 puffs into the lungs every 6 (six) hours as needed  for wheezing or shortness of breath.  1 Inhaler  2  . azithromycin (ZITHROMAX) 250 MG tablet Take 2 Tabs on first day. Than one tab after that.  6 tablet  0  . busPIRone (BUSPAR) 15 MG tablet Take 1 tablet (15 mg total) by mouth 4 (four) times daily.  120 tablet   5  . cyclobenzaprine (FLEXERIL) 10 MG tablet Take 1 tablet (10 mg total) by mouth 3 (three) times daily as needed for muscle spasms.  30 tablet  2  . diazepam (VALIUM) 5 MG tablet Take 1 tablet (5 mg total) by mouth 2 (two) times daily as needed for anxiety.  60 tablet  2  . ibuprofen (ADVIL,MOTRIN) 600 MG tablet Take 1 tablet (600 mg total) by mouth every 6 (six) hours as needed.  30 tablet  0  . lisinopril (PRINIVIL,ZESTRIL) 40 MG tablet Take 1 tablet (40 mg total) by mouth daily.  90 tablet  3  . Lurasidone HCl (LATUDA) 60 MG TABS Take 40 mg by mouth. Decreased to 40 mg after a trial at 60 mg.  Might have been causing increased irritability.  #30 with 5 refills.      Marland Kitchen oxyCODONE-acetaminophen (ROXICET) 5-325 MG per tablet Take 1 tablet by mouth every 8 (eight) hours as needed for severe pain.  20 tablet  0  . predniSONE (DELTASONE) 50 MG tablet Take 1 tablet (50 mg total) by mouth daily with breakfast.  5 tablet  0  . sildenafil (VIAGRA) 100 MG tablet Take 0.5 tablets (50 mg total) by mouth daily as needed for erectile dysfunction.  5 tablet  11   No current facility-administered medications for this visit.    Objective: BP 138/81  Pulse 106  Temp(Src) 98 F (36.7 C) (Oral)  Ht 5\' 11"  (1.803 m)  Wt 176 lb 14.4 oz (80.241 kg)  BMI 24.68 kg/m2 Gen: NAD, alert, cooperative with exam HEENT: NCAT, EOMI, PERRL CV: RRR, good S1/S2, no murmur Resp: Bilateral wheezes present diffusely, non-labored Abd: Soft, Non Tender, Non Distended, BS present, significant surgical scarring noted specifically along the midline. Ext: No edema, warm Neuro: Alert and oriented, No gross deficits   Assessment/Plan:  Dyspnea Patient complaining of dyspnea. He states that he has been experiencing this for the past week. Patient does not appear to be in any acute distress. He states that he did cough up some brown sputum over the past week. He also experienced some posttussive heaving.  - Chest x-ray (2 view)  to rule out any mass or pneumonia - Azithromycin five-day course (250 mg) - Prednisone Dosepak 50 mg x5 days  Patient has an appointment for a spirometry evaluation with Dr. Valentina [name] tomorrow 04/11/2014. I asked that he obtain the chest x-ray prior to this appointment.  Abdominal pain, chronic, diffuse Patient states that his pain has been fairly well controlled. He says that today is one of his bad days and he does not know why, but with the exception of today he has been feeling quite well. Last appointment he and I have begun the transition period to begin weaning him from his narcotic medications. We switched from oxycodone 10 mg tablets x10 to oxycodone 5 mg tablets x20 per month. Patient states that this is worked out fairly well and he was able to take 7.5 mg on "bad days" instead of 10 mg. He was able to make this prescription last all month and he continues to be on board with our goals to slowly wean him from  this medication.  With that said we are continuing the same prescription dosage and tablet amounts this month with hopes to decrease the amount of tablets next month.    Patient did have some vague complaints of nocturia as well some increase frequency/hesitancy. He and I have agreed to perform a prostate exam at our next visit. If patient is found to have BPH consider Cialis as a treatment plan.    Orders Placed This Encounter  Procedures  . DG Chest 2 View    Standing Status: Future     Number of Occurrences: 1     Standing Expiration Date: 06/12/2015    Order Specific Question:  Reason for Exam (SYMPTOM  OR DIAGNOSIS REQUIRED)    Answer:  COPD    Order Specific Question:  Preferred imaging location?    Answer:  [facility]    Meds ordered this encounter  Medications  . predniSONE (DELTASONE) 50 MG tablet    Sig: Take 1 tablet (50 mg total) by mouth daily with breakfast.    Dispense:  5 tablet    Refill:  0  . azithromycin (ZITHROMAX) 250 MG tablet     Sig: Take 2 Tabs on first day. Than one tab after that.    Dispense:  6 tablet    Refill:  0  . oxyCODONE-acetaminophen (ROXICET) 5-325 MG per tablet    Sig: Take 1 tablet by mouth every 8 (eight) hours as needed for severe pain.    Dispense:  20 tablet    Refill:  0

## 2014-04-12 ENCOUNTER — Ambulatory Visit (INDEPENDENT_AMBULATORY_CARE_PROVIDER_SITE_OTHER): Payer: Medicaid Other | Admitting: Pharmacist

## 2014-04-12 ENCOUNTER — Encounter: Payer: Self-pay | Admitting: Pharmacist

## 2014-04-12 VITALS — BP 154/95 | HR 90 | Ht 70.0 in | Wt 177.9 lb

## 2014-04-12 DIAGNOSIS — Z720 Tobacco use: Secondary | ICD-10-CM

## 2014-04-12 DIAGNOSIS — R0989 Other specified symptoms and signs involving the circulatory and respiratory systems: Secondary | ICD-10-CM

## 2014-04-12 DIAGNOSIS — R0609 Other forms of dyspnea: Secondary | ICD-10-CM

## 2014-04-12 DIAGNOSIS — F17200 Nicotine dependence, unspecified, uncomplicated: Secondary | ICD-10-CM

## 2014-04-12 DIAGNOSIS — R0600 Dyspnea, unspecified: Secondary | ICD-10-CM

## 2014-04-12 MED ORDER — TIOTROPIUM BROMIDE MONOHYDRATE 2.5 MCG/ACT IN AERS: 1.0000 | INHALATION_SPRAY | Freq: Every day | RESPIRATORY_TRACT | Status: DC

## 2014-04-12 NOTE — Assessment & Plan Note (Signed)
Chronic tobacco abuse since age of 26. Currently smoking 2 packs per day and is not ready for smoking cessation at this time.  Reevaluate at next visit.  Encourage tobacco reduction/cessation as patient is interested.

## 2014-04-12 NOTE — Progress Notes (Signed)
S:    Patient arrives in good spirits, unassisted. He reports that he was seen by PCP yesterday for difficulty breathing. He was prescribed azithromycin and prednisone x 5 days for possible COPD exacerbation. Presents for lung function evaluation.  Patient reports breathing has been getting worse, he has been using his albuterol 4 times a day and does not feel that it is working as well anymore. Currently smoking 2 packs per day and not ready to quit at this time.  O: CAT score= 25 See Documentation Flowsheet - CAT/COPD for complete symptom scoring.  See "scanned report" or Documentation Flowsheet (discrete results - PFTs) for  Spirometry results. Patient provided good effort while attempting spirometry.   Lung Age = 57 years  A/P: Spirometry evaluation reveals "near normal spirometry" with FEV1% of 74 in patient who has taken albuterol twice today prior to evaluation.  Subjectively he reports slight improvement with breathing since yesterday.  His spirometry improved slightly compared to spirometry evaluation in January.  Patient has been experiencing shortness of breath for the last few weeks and has been taking albuterol up to 4 times daily. Will treat as GOLD Classification B based on 1 exacerbation in the past year and CAT score of 25.  Will initiate Spiriva Respimat to be taken 2 inhalation daily.  Educated patient on purpose, proper use, potential adverse effects.  Reviewed results of pulmonary function tests.  Pt verbalized understanding of results and education.  Written pt instructions provided.  F/U Clinic visit in two weeks.   Total time in face to face counseling 30 minutes.  Patient seen with Gloriajean Dell, PharmD Candidate;  Fuller Canada,  PharmD Resident, and Nicoletta Ba, PharmD Resident.   Chronic tobacco abuse since age of 62. Currently smoking 2 packs per day and is not ready for smoking cessation at this time.  Reevaluate at next visit.  Encourage tobacco reduction/cessation as  patient is interested.

## 2014-04-12 NOTE — Assessment & Plan Note (Signed)
Spirometry evaluation reveals "near normal spirometry" with FEV1% of 74 in patient who has taken albuterol twice today prior to evaluation.  Subjectively he reports slight improvement with breathing since yesterday.  His spirometry improved slightly compared to spirometry evaluation in January.  Patient has been experiencing shortness of breath for the last few weeks and has been taking albuterol up to 4 times daily. Will treat as GOLD Classification B based on 1 exacerbation in the past year and CAT score of 25.  Will initiate Spiriva Respimat to be taken 2 inhalation daily.  Educated patient on purpose, proper use, potential adverse effects.  Reviewed results of pulmonary function tests.  Pt verbalized understanding of results and education.  Written pt instructions provided.  F/U Clinic visit in two weeks.   Total time in face to face counseling 30 minutes.  Patient seen with Gloriajean Dell, PharmD Candidate;  Fuller Canada,  PharmD Resident, and Nicoletta Ba, PharmD Resident.   Chronic tobacco abuse since age of 35. Currently smoking 2 packs per day and is not ready for smoking cessation at this time.  Reevaluate at next visit.  Encourage tobacco reduction/cessation as patient is interested.

## 2014-04-12 NOTE — Patient Instructions (Addendum)
It was nice to see you today.  Your lung function is a little bit better than last time.  The most important thing that you can do for your health is to quit smoking  Please start taking the Spiriva. We sent it to your pharmacy.  Please come back to Pharmacy Clinic in two weeks.

## 2014-04-12 NOTE — Progress Notes (Signed)
Patient ID: Timothy [name], male   DOB: 03/28/1966, 48 y.o.   MRN: [id number] Reviewed: Agree with Dr. Graylin Shiver documentation and management.

## 2014-04-14 ENCOUNTER — Encounter: Payer: Self-pay | Admitting: *Deleted

## 2014-04-14 NOTE — Progress Notes (Signed)
Prior Authorization received from [employer] for Spiriva Respimat. Formulary changed to [place]; inhale 1 puff into lungs daily per Dr. Valentina Lucks; Dr. Andria Frames. Derl Barrow, RN

## 2014-04-26 ENCOUNTER — Ambulatory Visit (INDEPENDENT_AMBULATORY_CARE_PROVIDER_SITE_OTHER): Payer: Medicaid Other | Admitting: Pharmacist

## 2014-04-26 ENCOUNTER — Encounter: Payer: Self-pay | Admitting: Pharmacist

## 2014-04-26 VITALS — BP 133/88 | HR 92 | Ht 70.0 in | Wt 181.4 lb

## 2014-04-26 DIAGNOSIS — R0600 Dyspnea, unspecified: Secondary | ICD-10-CM

## 2014-04-26 DIAGNOSIS — Z720 Tobacco use: Secondary | ICD-10-CM

## 2014-04-26 NOTE — Progress Notes (Signed)
S:    Patient arrives in good spirits, unassisted.  Presents for lung function evaluation follow up - he completed PFTs two weeks ago, and was initiated on Spiriva.  Patient reports breathing has improved since starting Spiriva. He has only had to use his albuterol inhaler 3 times since he started Spiriva, whereas prior to Spiriva, he was using the albuterol inhaler 4 times a day. He reports no difficulty in using the Spiriva Handihaler.   He reports that he feels better and has noticed improvement in completing ADLs since starting Spiriva.   He reports that he is smoking 2 packs of cigarettes per day. He is not interested in quitting at this time.     A/P: Dyspnea, improved on Spiriva daily. Will continue to treat as GOLD Classification B based on 1 COPD exacerbation and CAT score of 25. Continue Spiriva once daily. Reviewed how to use Spiriva Handihaler. Pt verbalized understanding of education.   Longstanding tobacco abuse. Reinforced importance of smoking cessation but patient not interested at this time. Offered assistance if he ever is interested in smoking cessation.  Written pt instructions provided.  F/U Clinic visit with PCP.   Total time in face to face counseling 20 minutes.  Patient seen with Clydie Braun, PharmD Candidate;  Katherine Roan,  PharmD Resident, and Nicoletta Ba, PharmD resident.

## 2014-04-26 NOTE — Assessment & Plan Note (Signed)
Longstanding tobacco abuse. Reinforced importance of smoking cessation but patient not interested at this time. Offered assistance if he ever is interested in smoking cessation.  Written pt instructions provided.  F/U Clinic visit with PCP.

## 2014-04-26 NOTE — Assessment & Plan Note (Signed)
Dyspnea, improved on Spiriva daily. Will continue to treat as GOLD Classification B based on 1 COPD exacerbation and CAT score of 25. Continue Spiriva once daily. Reviewed how to use Spiriva Handihaler. Pt verbalized understanding of education.   Longstanding tobacco abuse. Reinforced importance of smoking cessation but patient not interested at this time. Offered assistance if he ever is interested in smoking cessation.  Written pt instructions provided.  F/U Clinic visit with PCP.

## 2014-04-26 NOTE — Patient Instructions (Signed)
It was nice to see you today [name].  Keep taking the Spiriva.  If you every become interested in quitting smoking, please let us know, we are happy to help!

## 2014-04-27 ENCOUNTER — Ambulatory Visit (INDEPENDENT_AMBULATORY_CARE_PROVIDER_SITE_OTHER): Payer: Medicaid Other | Admitting: Psychology

## 2014-04-27 DIAGNOSIS — F3170 Bipolar disorder, currently in remission, most recent episode unspecified: Secondary | ICD-10-CM

## 2014-04-27 NOTE — Assessment & Plan Note (Signed)
Report of mood is euthymic.  Affect is consistent.  New relationship (hopefully) speaks to increased motivation and decent level of energy.  Thoughts are clear and goal directed.  No mention of his father today.  He does with to continue in therapy.  Scheduled that appointment.  No changes in meds requested or thought needed by patient and treatment team.  Will follow as needed in [facility] and will follow more regularly in [facility].  Will continue to monitor smoking.

## 2014-04-27 NOTE — Progress Notes (Signed)
Timothy [name] presents for follow-up.  He is doing well per his report.  He was not able to get the dog like he expected.  He does have a new relationship.    Reports about 1 day in the last 7 where mood was bothersome.  Timothy [name] had several friends over to the house despite Timothy [name] asking him not to.  Ended up "cursing them out of the house."  Does not like to get to that point but felt like he did not have a lot of choice.  He continues to smoke 2 ppd.  He reports he is fairly content with where he is currently.

## 2014-04-27 NOTE — Progress Notes (Signed)
Patient ID: Timothy [name], male   DOB: 07-Dec-1965, 48 y.o.   MRN: [id number] Reviewed: Agree with Dr. Graylin Shiver documentation and management.

## 2014-04-29 ENCOUNTER — Ambulatory Visit (INDEPENDENT_AMBULATORY_CARE_PROVIDER_SITE_OTHER): Payer: Medicaid Other | Admitting: Family Medicine

## 2014-04-29 ENCOUNTER — Encounter: Payer: Self-pay | Admitting: Family Medicine

## 2014-04-29 VITALS — BP 121/74 | HR 85 | Temp 97.7°F | Ht 70.0 in | Wt 179.2 lb

## 2014-04-29 DIAGNOSIS — G8929 Other chronic pain: Secondary | ICD-10-CM

## 2014-04-29 DIAGNOSIS — R1010 Upper abdominal pain, unspecified: Secondary | ICD-10-CM

## 2014-04-29 DIAGNOSIS — R1011 Right upper quadrant pain: Principal | ICD-10-CM

## 2014-04-29 MED ORDER — OXYCODONE-ACETAMINOPHEN 5-325 MG PO TABS: 1.0000 | ORAL_TABLET | Freq: Three times a day (TID) | ORAL | Status: DC | PRN

## 2014-04-29 NOTE — Assessment & Plan Note (Signed)
Patient states that he has had increased pain this month. He believes that this is partially due to the fact that he has a new girlfriend and has become more sexually active. This appeared with his extensive abdominal surgery history and his relative deconditioning has caused increased pain over the past 2 weeks. - I've written patient an additional 10 tablets of 5/325 oxycodone. - Patient and I came to an agreement that he would also add 15 minutes of home physical therapy to his daily regimen. - We also agreed that this month was an outlier. This did not symbolize [place] taking steps back in our progress towards weaning him off of narcotics. Instead this was a small obstacle  to get through. And that he and I would start back where we left off beginning his next visit.

## 2014-04-29 NOTE — Progress Notes (Signed)
Patient ID: Timothy [name], male   DOB: 02-12-1966, 48 y.o.   MRN: [id number]   HPI  Patient presents today for increased abdominal pain. Patient states he is recently been experiencing increased abdominal pain. He believes that this pain is due to the fact that he is recently begun a new relationship with a girlfriend and he has been more sexually active recently. Because of this increase in pain he has had to take more pain medication than he is used to. He states that he is taken all but 4 or 5 his tablets. He and I discussed at length the ramifications of getting additional pain medication at this time. He and I were able to discuss and agreed upon a compromise for this prescription.  Patient also states that he is no longer having issues with erectile dysfunction. With that said he does believe he continues to have some symptoms suggestive of BPH. We did not have time during our visit to do a full rectal exam. He and I discussed that I would address this at his next visit.  No complaints of fatigue weakness cough nausea vomiting diarrhea chest pain dyspnea wheezing heat/cold intolerance numbness tingling headache melena hematochezia hemoptysis.  Smoking status noted ROS: Per HPI  Objective: BP 121/74  Pulse 85  Temp(Src) 97.7 F (36.5 C) (Oral)  Ht 5\' 10"  (1.778 m)  Wt 179 lb 3.2 oz (81.285 kg)  BMI 25.71 kg/m2 Gen: NAD, alert, cooperative with exam HEENT: NCAT, EOMI, PERRL CV: RRR, good S1/S2, no murmur Resp: CTABL, no wheezes, non-labored Abd: SNTND, BS present, no guarding or organomegaly. Extensive abdominal surgical scars noted on the midline. Ext: No edema, warm Neuro: Alert and oriented, No gross deficits  Assessment and plan:  Abdominal pain, chronic, diffuse Patient states that he has had increased pain this month. He believes that this is partially due to the fact that he has a new girlfriend and has become more sexually active. This appeared with his extensive abdominal  surgery history and his relative deconditioning has caused increased pain over the past 2 weeks. - I've written patient an additional 10 tablets of 5/325 oxycodone. - Patient and I came to an agreement that he would also add 15 minutes of home physical therapy to his daily regimen. - We also agreed that this month was an outlier. This did not symbolize [place] taking steps back in our progress towards weaning him off of narcotics. Instead this was a small obstacle  to get through. And that he and I would start back where we left off beginning his next visit.   No orders of the defined types were placed in this encounter.    Meds ordered this encounter  Medications  . oxyCODONE-acetaminophen (ROXICET) 5-325 MG per tablet    Sig: Take 1 tablet by mouth every 8 (eight) hours as needed for severe pain.    Dispense:  10 tablet    Refill:  0     Elberta Leatherwood, MD,MS,  PGY1 04/29/2014 4:59 PM

## 2014-04-29 NOTE — Patient Instructions (Signed)
It was a please seeing you today in our clinic. Today we discussed your pain. Here is the treatment plan we have discussed and agreed upon together:   - Today you and I have agreed upon a compromise.  - I have written you an additional 10 tablets of oxycodone 5/325 for pain (abdominal)  - You have agreed to be more compliant by adding 15 minutes (at least) of home physical therapy to your everyday schedule  - You and I have also agreed that this month is a rare month and that starting next month we will be back to attempting our reduction of monthly narcotic use.  It was a pleasure seeing you again as always.

## 2014-05-09 ENCOUNTER — Ambulatory Visit (INDEPENDENT_AMBULATORY_CARE_PROVIDER_SITE_OTHER): Payer: Medicaid Other | Admitting: Psychology

## 2014-05-09 ENCOUNTER — Encounter: Payer: Self-pay | Admitting: Psychology

## 2014-05-09 DIAGNOSIS — F3170 Bipolar disorder, currently in remission, most recent episode unspecified: Secondary | ICD-10-CM

## 2014-05-09 NOTE — Progress Notes (Signed)
Timothy [name] presents for follow-up.  He reports he continues to feel better - largely because of a new relationship he has with a woman named Timothy [name].  She is the mother of a friend of Steve's son.  They have been dating 6 weeks + and it is going well.  He has been more active since dating her and has found himself saying "yes" to other opportunities including helping a guy out with some housework today.  Prior to this, his main activity most days, most all of the day, was sitting and watching television.  Smoking about 2 ppds.  Not interested in quitting or cutting back at this time.

## 2014-05-09 NOTE — Assessment & Plan Note (Signed)
He thinks his meds are fine where they are.  Overall he reports things are going well.  He is working on more patience across the board and in particular with one of Donna's sons who has been diagnosed with autism.  Provided support and utilized MI strategies to continue to assess motivation regarding smoking and more structure / activity / meaning.  The latter has been provided via an external source (new relationship).  While this change is positive, it might have a better chance of maintenance if it had been internally driven.  Will continue to monitor.  Timothy [name] elected to follow-up in a few weeks - November 12th at 9:00.

## 2014-05-24 ENCOUNTER — Ambulatory Visit (INDEPENDENT_AMBULATORY_CARE_PROVIDER_SITE_OTHER): Payer: Medicaid Other | Admitting: Family Medicine

## 2014-05-24 ENCOUNTER — Encounter: Payer: Self-pay | Admitting: Family Medicine

## 2014-05-24 VITALS — BP 137/92 | HR 89 | Ht 70.0 in | Wt 181.0 lb

## 2014-05-24 DIAGNOSIS — G8929 Other chronic pain: Secondary | ICD-10-CM

## 2014-05-24 DIAGNOSIS — R1010 Upper abdominal pain, unspecified: Secondary | ICD-10-CM

## 2014-05-24 DIAGNOSIS — F410 Panic disorder [episodic paroxysmal anxiety] without agoraphobia: Secondary | ICD-10-CM

## 2014-05-24 DIAGNOSIS — F411 Generalized anxiety disorder: Secondary | ICD-10-CM

## 2014-05-24 DIAGNOSIS — R1011 Right upper quadrant pain: Secondary | ICD-10-CM

## 2014-05-24 MED ORDER — DIAZEPAM 5 MG PO TABS: 5.0000 mg | ORAL_TABLET | Freq: Two times a day (BID) | ORAL | Status: DC | PRN

## 2014-05-24 MED ORDER — OXYCODONE-ACETAMINOPHEN 5-325 MG PO TABS: 1.0000 | ORAL_TABLET | Freq: Three times a day (TID) | ORAL | Status: DC | PRN

## 2014-05-24 NOTE — Patient Instructions (Signed)
It was a pleasure seeing you today in our clinic. Today we discussed your pain, anxiety. Here is the treatment plan we have discussed and agreed upon together:  - refilled your diazepam and oxycodone - we will perform a full physical at your Dec or Jan.

## 2014-05-24 NOTE — Assessment & Plan Note (Signed)
Pain continues. He states that some of this pain seems to be localized within his right lower quadrant. This is slightly abnormal for him. No significant symptoms from this pain. Patient states that his pain is currently well controlled on his current pain medication regimen. No new issues to report.  I refilled patient's oxycodone today. I informed patient that I will refill his pain medication on the 3rd of each month.

## 2014-05-24 NOTE — Assessment & Plan Note (Addendum)
Patient states that he has had slightly increased anxiety over the past few weeks. He is asking for increase in diazepam. We had extensive discussion about some of the complications of diazepam. We also discussed his history of alcohol abuse. I informed him that diazepam acts on the same/similar receptors as alcohol. I informed him that I was not comfortable increasing the dosage of his diazepam at this time. I would like to explore the possibility of better control of his anxiety with other medications.  I would appreciate if Dr. Tammi Klippel or Dr. Gwenlyn Saran could address patient's anxiety. I believe that he could benefit from a SSRI. I believe that he is been placed on multiple different SSRIs in the past. Which have been unsuccessful in the past. With that said I believe that the benefit of a SSRI would benefit this patient depending on the rationale for discontinuing his previous attempts of SSRI treatments in the past.  I informed patient that I would look into this and may start him on a new SSRI by the new year if he continues to have increased anxiety.  Refill was placed for diazepam.

## 2014-05-24 NOTE — Progress Notes (Signed)
Timothy Lynch, MD, MS Phone: [phone]  Subjective:  Chief complaint  Pt Here for abdominal pain and anxiety.  Patient states that he is doing much better overall. Patient states that he has had slightly increased anxiety over the past few weeks. He is asking for increase in diazepam. We had extensive discussion about some of the complications of diazepam. We also discussed his history of alcohol abuse. I informed him that diazepam acts on the same/similar receptors as alcohol. I informed him that I was not comfortable increasing the dosage of his diazepam at this time. I would like to explore the possibility of better control of his anxiety with other medications.  As far as his pain goes he has had adequate control since her last meeting. No new issues. He continues to report some increase in abdominal pain secondary to intimate relations with his girlfriend. All of this seems to be non-concerning to him. Some increased pain in his right lower quadrant today. This is slightly abnormal because most pain comes from his left lower quadrant. Patient has had no changes in his bowels. He denies any fever chills or diaphoresis nausea vomiting diarrhea obstipation headache vision changes lightheadedness dizziness shortness of breath.  Review of Systems  Constitutional: Negative for fever, chills, malaise/fatigue and diaphoresis.  Eyes: Negative for blurred vision.  Respiratory: Negative for cough and shortness of breath.   Cardiovascular: Negative for chest pain.  Gastrointestinal: Positive for abdominal pain. Negative for nausea, vomiting, diarrhea and constipation.  Neurological: Negative for dizziness and headaches.  Psychiatric/Behavioral: The patient is nervous/anxious.      Past Medical History Patient Active Problem List   Diagnosis Date Noted  . Erectile dysfunction 03/14/2014  . Dyspnea 12/24/2013  . Olecranon bursitis of left elbow 09/13/2013  . Grief 08/05/2013  . Acute  bronchitis 07/26/2013  . Abdominal pain, chronic, diffuse 04/15/2013  . Akathisia 03/02/2013  . Hyperglycemia 03/02/2013  . Ventral hernia 01/11/2013  . Migraine headache 08/23/2012  . Essential hypertension, benign 06/28/2012  . Bipolar disorder 06/02/2012  . Insomnia 04/27/2012  . Panic anxiety syndrome 03/21/2012  . History of colostomy 03/21/2012  . History of alcohol abuse 10/29/2011  . History of diverticulitis of colon 10/16/2011  . Tobacco abuse disorder 10/16/2011    Medications- reviewed and updated Current Outpatient Prescriptions  Medication Sig Dispense Refill  . albuterol (PROVENTIL HFA;VENTOLIN HFA) 108 (90 BASE) MCG/ACT inhaler Inhale 2 puffs into the lungs every 6 (six) hours as needed for wheezing or shortness of breath. 1 Inhaler 2  . busPIRone (BUSPAR) 15 MG tablet Take 1 tablet (15 mg total) by mouth 4 (four) times daily. 120 tablet 5  . cyclobenzaprine (FLEXERIL) 10 MG tablet Take 1 tablet (10 mg total) by mouth 3 (three) times daily as needed for muscle spasms. 30 tablet 2  . diazepam (VALIUM) 5 MG tablet Take 1 tablet (5 mg total) by mouth 2 (two) times daily as needed for anxiety. 60 tablet 2  . lisinopril (PRINIVIL,ZESTRIL) 40 MG tablet Take 1 tablet (40 mg total) by mouth daily. 90 tablet 3  . lurasidone (LATUDA) 40 MG TABS tablet Take 40 mg by mouth daily. Per Dr. Tammi Klippel in [facility].  #30 with 11 refills.    Marland Kitchen oxyCODONE-acetaminophen (ROXICET) 5-325 MG per tablet Take 1 tablet by mouth every 8 (eight) hours as needed for severe pain. 10 tablet 0  . oxyCODONE-acetaminophen (ROXICET) 5-325 MG per tablet Take 1 tablet by mouth every 8 (eight) hours as needed for severe pain. [place]  tablet 0  . Tiotropium Bromide Monohydrate (SPIRIVA RESPIMAT) 2.5 MCG/ACT AERS Inhale 2 puffs into the lungs daily. 1 Inhaler 2   No current facility-administered medications for this visit.    Objective: BP 137/92 mmHg  Pulse 89  Ht 5\' 10"  (1.778 m)  Wt 181 lb (82.101 kg)  BMI 25.97  kg/m2 Gen: NAD, alert, cooperative with exam HEENT: NCAT, EOMI, PERRL CV: RRR, good S1/S2, no murmur Resp: CTABL, no wheezes, non-labored Abd: Soft, Non Tender, Non Distended, BS present, no guarding or organomegaly Ext: No edema, warm Neuro: Alert and oriented, No gross deficits   Assessment/Plan:  Panic anxiety syndrome Patient states that he has had slightly increased anxiety over the past few weeks. He is asking for increase in diazepam. We had extensive discussion about some of the complications of diazepam. We also discussed his history of alcohol abuse. I informed him that diazepam acts on the same/similar receptors as alcohol. I informed him that I was not comfortable increasing the dosage of his diazepam at this time. I would like to explore the possibility of better control of his anxiety with other medications.  I would appreciate if Dr. Tammi Klippel or Dr. Gwenlyn Saran could address patient's anxiety. I believe that he could benefit from a SSRI. I believe that he is been placed on multiple different SSRIs in the past. Which have been unsuccessful in the past. With that said I believe that the benefit of a SSRI would benefit this patient depending on the rationale for discontinuing his previous attempts of SSRI treatments in the past.  I informed patient that I would look into this and may start him on a new SSRI by the new year if he continues to have increased anxiety.  Refill was placed for diazepam.  Abdominal pain, chronic, diffuse Pain continues. He states that some of this pain seems to be localized within his right lower quadrant. This is slightly abnormal for him. No significant symptoms from this pain. Patient states that his pain is currently well controlled on his current pain medication regimen. No new issues to report.  I refilled patient's oxycodone today. I informed patient that I will refill his pain medication on the 3rd of each month.    No orders of the defined types  were placed in this encounter.    Meds ordered this encounter  Medications  . oxyCODONE-acetaminophen (ROXICET) 5-325 MG per tablet    Sig: Take 1 tablet by mouth every 8 (eight) hours as needed for severe pain.    Dispense:  20 tablet    Refill:  0  . diazepam (VALIUM) 5 MG tablet    Sig: Take 1 tablet (5 mg total) by mouth 2 (two) times daily as needed for anxiety.    Dispense:  60 tablet    Refill:  2

## 2014-06-02 ENCOUNTER — Telehealth: Payer: Self-pay | Admitting: Psychology

## 2014-06-02 ENCOUNTER — Ambulatory Visit: Payer: Medicaid Other | Admitting: Psychology

## 2014-06-02 NOTE — Telephone Encounter (Signed)
Timothy [name] called to say he forgot about his appointment and wished to reschedule.  Did so for the 18th at 1:30.  He may be going to [facility] for Thanksgiving and we thought it important for him to get in before then.

## 2014-06-06 ENCOUNTER — Encounter: Payer: Self-pay | Admitting: Family Medicine

## 2014-06-06 ENCOUNTER — Ambulatory Visit (INDEPENDENT_AMBULATORY_CARE_PROVIDER_SITE_OTHER): Payer: Medicaid Other | Admitting: Family Medicine

## 2014-06-06 VITALS — BP 150/97 | HR 82 | Temp 97.9°F | Ht 70.0 in

## 2014-06-06 DIAGNOSIS — J209 Acute bronchitis, unspecified: Secondary | ICD-10-CM

## 2014-06-06 MED ORDER — PREDNISONE 50 MG PO TABS: ORAL_TABLET | ORAL | Status: DC

## 2014-06-06 MED ORDER — AZITHROMYCIN 250 MG PO TABS: ORAL_TABLET | ORAL | Status: DC

## 2014-06-06 NOTE — Progress Notes (Signed)
   HPI  Patient presents today for increased cough 3 days. Patient is here due to a significant cough, fatigue, posttussive emesis 3 days. Patient states that he developed symptoms just prior to the weekend. Symptoms have worsened over the past 24-48 hours. He is also been experiencing some fever, and diaphoresis. Patient denies any diarrhea or nausea. He does report a change in sputum color. Patient has been taking NyQuil and ibuprofen for symptomatic relief.  No further issues reported.  Smoking status noted ROS: Per HPI  Objective: BP 150/97 mmHg  Pulse 82  Temp(Src) 97.9 F (36.6 C) (Oral)  Ht 5\' 10"  (1.778 m) Gen: NAD, alert, cooperative with exam HEENT: NCAT, EOMI, PERRL; MMM CV: RRR, good S1/S2, no murmur Resp: no increase work of breathing. Bilateral wheezes right greater than left noted. Abd: SNTND, BS present, no guarding or organomegaly Ext: No edema, warm Neuro: Alert and oriented, No gross deficits  Assessment and plan:  Acute bronchitis Patient has a long history of tobacco abuse. He is also had a recent flare of his COPD requiring steroids. Symptoms are highly suggestive of bacterial infection.  - Azithromycin 500 mg today, 250 mg for the following 4 days. - Prednisone 50 mg daily 5 days.  I had an appointment to see patient at the beginning of next month which will be approximately 2 weeks from today. I will follow-up with him at that time.    No orders of the defined types were placed in this encounter.    Meds ordered this encounter  Medications  . azithromycin (ZITHROMAX) 250 MG tablet    Sig: Take 2 tablets today. Take one tablet once a day until completion (with food).    Dispense:  6 tablet    Refill:  0  . predniSONE (DELTASONE) 50 MG tablet    Sig: Take on tablet once a day until completion.    Dispense:  5 tablet    Refill:  0     Elberta Leatherwood, MD,MS,  PGY1 06/06/2014 5:44 PM

## 2014-06-06 NOTE — Patient Instructions (Signed)
It was a pleasure seeing you today in our clinic. Today we discussed your recent cough and vomiting. Here is the treatment plan we have discussed and agreed upon together:   - I have prescribed you a "Z-Pack". Please take this to completion. 2 tablets today. 1 tablet once a day after that. - I also prescribed for you prednisone. Please take this once a day until completion. - Please do not hesitate to call me if your symptoms worsen or change in any way. Otherwise I look forward to our next appointment at the beginning of next month.

## 2014-06-06 NOTE — Assessment & Plan Note (Signed)
Patient has a long history of tobacco abuse. He is also had a recent flare of his COPD requiring steroids. Symptoms are highly suggestive of bacterial infection.  - Azithromycin 500 mg today, 250 mg for the following 4 days. - Prednisone 50 mg daily 5 days.  I had an appointment to see patient at the beginning of next month which will be approximately 2 weeks from today. I will follow-up with him at that time.

## 2014-06-08 ENCOUNTER — Ambulatory Visit (INDEPENDENT_AMBULATORY_CARE_PROVIDER_SITE_OTHER): Payer: Medicaid Other | Admitting: Psychology

## 2014-06-08 DIAGNOSIS — F3170 Bipolar disorder, currently in remission, most recent episode unspecified: Secondary | ICD-10-CM

## 2014-06-08 NOTE — Progress Notes (Signed)
Timothy [name] presents for follow-up.  He is feeling more more and more jittery as the trip to visit [place] approaches.  A bunch of family will be there and it is the first time Timothy [name] has been to [facility] since his dad's death last year 2023/07/22).  We discussed this and his relationship with Butch Penny.  He has dated her about 3 months but recently found himself confused and overwhelmed by the relationship.  He has since broken it off but remains ambivalent - largely because of loneliness.  Discussed.  He has a new puppy named Dabs.

## 2014-06-08 NOTE — Assessment & Plan Note (Signed)
Anxiety is higher and his stress level has increased, both with the relationship issues and more importantly, the trip to [facility].  We talked about the idea that he can't medicate these feelings away and in fact, if he weren't feeling more nervous, it would be a sign that he is over-medicated.  The emotions are there because he lost his best friend.  I believe he can manage them, despite how uncomfortable they may be.  He did not disagree with me.  He was unable to identify what was most concerning to him.  I think he has been able to put his grief to the side and going there will surely bring it to the forefront.  Plus the timing is awfully close to the actual loss of his dad.  He voiced some ambivalence about ending the relationship at the mid-point of our session.  We explored things further and he concluded that he is simply not ready for a relationship right now.  Discussed how to handle a follow-up conversation with Butch Penny if she pushes the issue.  Coping:  Distraction is a good one for the short-term.  Finding things to do other than watching law-in-order and sitting in a chair most of the day is a good longer-term coping mechanism.  The new puppy may help keep him active and combat some of the loneliness.    He voiced an interest in returning to [facility] to discuss his anxiety.  He thinks he has grown tolerant to the diazepam.  My guess is that Dr. Tammi Klippel will say a SSRI is contraindicated secondary to his Bipolar Disorder.  We can explore other options however I suspect the best answer will not be in a pill.  Will discuss at [facility] on 07/06/14.  I have a beh med follow-up post Thanksgiving.

## 2014-06-23 ENCOUNTER — Ambulatory Visit (INDEPENDENT_AMBULATORY_CARE_PROVIDER_SITE_OTHER): Payer: Medicaid Other | Admitting: Psychology

## 2014-06-23 DIAGNOSIS — F4321 Adjustment disorder with depressed mood: Secondary | ICD-10-CM

## 2014-06-23 DIAGNOSIS — F3170 Bipolar disorder, currently in remission, most recent episode unspecified: Secondary | ICD-10-CM

## 2014-06-23 NOTE — Progress Notes (Signed)
Timothy [name] presents for follow-up.  Things are going well with his dog.  His new name is Timothy [name] (named after [employer] where he was found).  He might be prompting [name] to be a bit more active but not overwhelmingly so.  Major focus of our visit was his trip to [facility] to visit with his [name] and to see his dad's [name].  He said he felt "weird" and a "warm" feeling being there.  Going to the [place] was the hardest part but it was not as bad as he anticipated.  He gets the sense that he has not really grieved his dad.  Explored at length.

## 2014-06-23 NOTE — Assessment & Plan Note (Signed)
As best I can tell, [name] has put barriers up to his emotions.  The downside of this is he feels more irritable.  The downside to being more open with his feelings and gaining greater access to them appears to be tied to guilt he feels about not being there when his dad died.  We discussed this and he concluded that his dad said what he needed to say and what [name] wished he had been able to say (if he knew that was the last time he was going to see him) are things his dad already knew.  Bottom line:  [name] gets to decide how to grieve.  He seems ambivalent about how much he needs to "work" on this.  He was unable to come up with any ideas on how to access his emotions / break down those barriers.  He said he open to suggestions:  1.  Writing tends to access emotions; writing a letter to his dad expressing his feelings would likely bring out some feelings and help him process.  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 2. Conversing with his dad.  He did this more after he died and it has tapered off. 3. Visiting the [place]; not possible secondary to location. 4. Developing a ritual for the anniversary of his death. 5. Continued conversation with people talking about the loss of his father (me, family, friends).  Also reminded him that he has a choice on whether to act irritable when feeling irritable.  He could choose kindness instead.  He recognizes that if he did, he would likely feel less irritable.    Will follow 12/29 or sooner if needed.

## 2014-06-23 NOTE — Assessment & Plan Note (Signed)
Appears stable in the context of grief, especially with the anniversary of his dad's death one week away.

## 2014-06-24 ENCOUNTER — Ambulatory Visit (INDEPENDENT_AMBULATORY_CARE_PROVIDER_SITE_OTHER): Payer: Medicaid Other | Admitting: Family Medicine

## 2014-06-24 ENCOUNTER — Encounter: Payer: Self-pay | Admitting: Family Medicine

## 2014-06-24 VITALS — BP 148/79 | HR 98 | Temp 97.6°F | Wt 188.0 lb

## 2014-06-24 DIAGNOSIS — R1011 Right upper quadrant pain: Secondary | ICD-10-CM

## 2014-06-24 DIAGNOSIS — Z7189 Other specified counseling: Secondary | ICD-10-CM

## 2014-06-24 DIAGNOSIS — R1013 Epigastric pain: Secondary | ICD-10-CM

## 2014-06-24 DIAGNOSIS — R1010 Upper abdominal pain, unspecified: Secondary | ICD-10-CM

## 2014-06-24 DIAGNOSIS — G8929 Other chronic pain: Secondary | ICD-10-CM | POA: Insufficient documentation

## 2014-06-24 MED ORDER — CYCLOBENZAPRINE HCL 10 MG PO TABS: 10.0000 mg | ORAL_TABLET | Freq: Three times a day (TID) | ORAL | Status: DC | PRN

## 2014-06-24 MED ORDER — OXYCODONE-ACETAMINOPHEN 5-325 MG PO TABS: 1.0000 | ORAL_TABLET | Freq: Three times a day (TID) | ORAL | Status: DC | PRN

## 2014-06-24 NOTE — Assessment & Plan Note (Addendum)
Patient is complaining of increased abdominal pain localized to the left lower quadrant. Pain is likely secondary to muscle soreness. No masses present on exam. No signs of cellulitis. No swelling ecchymoses or erythema. Patient denies any melena or hematochezia. Patient does report some increased abdominal work secondary to activities at home. Patient does not believe that he needs any additional pain medication at this time.  - Refilled patient's oxycodone 5/325 at this time. 20 tablets. I have urged patient to make an active effort to reduce the amount of tablets he needs each month. I reminded him of this during this visit and patient stated his understanding. - Flexeril prescription has also been refilled at this time. Patient was one month past due on his refill and still had some tablets left in the bottle--however not enough to cover until our next visit.  We'll perform a UDS at our next visit. Provider forgot to do this during our time together.

## 2014-06-24 NOTE — Progress Notes (Signed)
   HPI  Patient presents today for medication follow-up and abdominal pain. Patient is here for a medication refill as well as increased abdominal pain 1 week. He states that he has had some increased pain in his lower left quadrant which is somewhat superficial in nature. He states that it feels mostly like muscle soreness. It is increased with any bending or twisting. Not relieved with bowel movements. He denies any hematochezia or melena. No fevers chills nausea vomiting diarrhea.  Patient has been doing very well with his current pain medication regimen.  Smoking status noted ROS: Per HPI  Objective: BP 148/79 mmHg  Pulse 98  Temp(Src) 97.6 F (36.4 C) (Oral)  Wt 188 lb (85.276 kg) Gen: NAD, alert, cooperative with exam HEENT: NCAT, EOMI, PERRL CV: RRR, good S1/S2, no murmur Resp: CTABL, no wheezes, non-labored Abd: Tenderness noted with light palpation to the left lower quadrant. BS present, no guarding or organomegaly Ext: No edema, warm Neuro: Alert and oriented, No gross deficits  Assessment and plan:  Abdominal pain, chronic, diffuse Patient is complaining of increased abdominal pain localized to the left lower quadrant. Pain is likely secondary to muscle soreness. No masses present on exam. No signs of cellulitis. No swelling ecchymoses or erythema. Patient denies any melena or hematochezia. Patient does report some increased abdominal work secondary to activities at home. Patient does not believe that he needs any additional pain medication at this time.  - Refilled patient's oxycodone 5/325 at this time. 20 tablets. I have urged patient to make an active effort to reduce the amount of tablets he needs each month. I reminded him of this during this visit and patient stated his understanding. - Flexeril prescription has also been refilled at this time. Patient was one month past due on his refill and still had some tablets left in the bottle--however not enough to cover  until our next visit.  We'll perform a UDS at our next visit. Provider forgot to do this during our time together.    No orders of the defined types were placed in this encounter.    Meds ordered this encounter  Medications  . oxyCODONE-acetaminophen (ROXICET) 5-325 MG per tablet    Sig: Take 1 tablet by mouth every 8 (eight) hours as needed for severe pain.    Dispense:  20 tablet    Refill:  0  . cyclobenzaprine (FLEXERIL) 10 MG tablet    Sig: Take 1 tablet (10 mg total) by mouth 3 (three) times daily as needed for muscle spasms.    Dispense:  30 tablet    Refill:  2     Elberta Leatherwood, MD,MS,  PGY1 06/24/2014 6:01 PM

## 2014-06-24 NOTE — Patient Instructions (Signed)
It was a pleasure seeing you today in our clinic. Today we discussed pain control, increased abdominal pain. Here is the treatment plan we have discussed and agreed upon together:   - I have refilled your prescription for oxycodone. Please make an active effort to attempt to reduce the amount of this medication you take this coming month. My goal is to have you take only 18 tablets of the 20 I have provided you this month. - I refilled your Flexeril. You have 2 additional refills on this medication. - Continue to perform your at-home physical therapy exercises at least 2 times each day.

## 2014-07-06 ENCOUNTER — Ambulatory Visit (INDEPENDENT_AMBULATORY_CARE_PROVIDER_SITE_OTHER): Payer: Medicaid Other | Admitting: Psychology

## 2014-07-06 DIAGNOSIS — F3170 Bipolar disorder, currently in remission, most recent episode unspecified: Secondary | ICD-10-CM

## 2014-07-06 NOTE — Progress Notes (Signed)
Timothy [name] presents for follow-up.  He says he has been talking to Dr. Alease Frame about his ongoing anxiety and the possible need to increase his diazepam.  This was the focus of our visit.    Treatment team recalls that Timothy [name] has been doing fairly well with mood control and reasonably well with anxiety especially when distracted by positive things (e.g. A relationship).    Caffeine intake is notable at 2-3 cups of coffee per day and 4-5 20 ounces of Diet [facility].  He also smokes 1.5 ppd.  He is not interested in changing these behaviors.

## 2014-07-06 NOTE — Patient Instructions (Signed)
Please schedule a follow up for 07/27/2014 at 10:30. Dr. Tammi Klippel thought it was reasonable to decrease your Buspar by one pill per day because the Latuda and the Buspar because these two medicines have the same effect on your brain and you might be getting too much.  Too much often results in fidgetiness, restlessness and irritability. We will see you back soon to reassess.  Keeping your journal until then would give [place] some good information.

## 2014-07-06 NOTE — Assessment & Plan Note (Signed)
Sometimes, anxiety can be a function of decreased mood control and Dr. Tammi Klippel wondered if that might be the case here.  Timothy [name] takes 60 mg of Buspar which is the upper dose.  He is also on [name] and they have a similar effect on the brain.  Dr. Tammi Klippel thought it possible Timothy [name] is getting too much of a good thing and that he might be less fidgety (his words for what he also describes as anxiety) if he decreased this.    Thoughts about diazepam:  We could increase this and it would likely have a beneficial effect for a relatively short period of time prior to him requesting an increase again.  This is not thought to be the most prudent route with him and certainly behavioral changes that impact stress are not being tried fully - nor does he have much interest in trying them.  His dog has caused him to be a bit more active than he usually is.  Other thoughts - adding a low dose of lithium.  Timothy [name] remembers his mom was on this and didn't "do well" on it and neither did his son.  Currently takes 40 mg of Latuda.  We recommend he decrease to 45 mg of Buspar.  Treatment team would not recommend increasing the diazepam dose at present.

## 2014-07-19 ENCOUNTER — Ambulatory Visit: Payer: Medicaid Other | Admitting: Psychology

## 2014-07-27 ENCOUNTER — Ambulatory Visit (INDEPENDENT_AMBULATORY_CARE_PROVIDER_SITE_OTHER): Payer: Medicaid Other | Admitting: Psychology

## 2014-07-27 DIAGNOSIS — F3170 Bipolar disorder, currently in remission, most recent episode unspecified: Secondary | ICD-10-CM

## 2014-07-27 NOTE — Progress Notes (Signed)
Timothy [name] presents for follow-up.  He reports that the decrease in on Buspar has had a good effect on his irritability.  He does note increased shaking which he attributes to anxiety.    He got rid of the dog because of potty training issues.  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  [name]'s mother plans to send bi-monthly checks to help cover the rent.  Denies worrisome thoughts with the exception of finances.  Thinks the plan in place to manage his finances is reasonable.  Caffeine intake is 3 cups of strong coffee in the morning and at least 4 20 oz Diet MD daily.  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    Has been occasionally taking a friend's Xanax and has been intermittently smoking some marijuana to manage his anxiety.

## 2014-07-27 NOTE — Assessment & Plan Note (Addendum)
Report of mood is euthymic today.  Affect is consistent.  He is tremulous.  Can not report anything that he is currently anxious about but feels jittery / shaky.  Reports that the diazepam "is not doing anything" but is reluctant to stop it despite its reported ineffectiveness.  Discussed options.  Number 1 recommendation from treatment team is to decrease caffeine intake.  He is a "heavy caffeine" user taking in between 450-600 mg of caffeine daily.  He is ambivalent about cutting back secondary to already feeling that he has cut back significantly.  Also discussed a behavioral strategy of moving his body more or getting engaged in something meaningful.  He notices he is less anxious when doing work with friends but says he can't stay busy 24/7.  These are interventions we have discussed before and he is not particularly motivated to enact changes.  He is interested in increasing his diazepam or starting another medicine that has been effective (Xanax).  Treatment team thinks this is not consistent with good care given his presenting issues and history of substance abuse.  Additionally, his diazepam has been prescribed by his PCP while [facility] was prescribing other meds.  Would recommend [facility] take over all psychotropics.  Will discuss with PCP and put in overview if in agreement.

## 2014-07-27 NOTE — Patient Instructions (Signed)
Please schedule a follow-up for:  February 3 at 9:00. We talked about your anxiety today and I see that you are tremulous.  The amount of caffeine (450-600 mg a day) you are drinking is enough to create those effects in your body. You said the diazepam is no longer helpful.  One way to test that is to cut back on the dose and see if you notice a difference. Increasing or starting you on a new anti-anxiety medicine like Xanax is not a safe long-term option for you from our perspective.  We will prescribe your Diazepam moving forward since we are in charge of your other mood and anxiety medicines.   Moving your body regularly is a nice way to burn off some anxiety.

## 2014-08-02 ENCOUNTER — Ambulatory Visit (INDEPENDENT_AMBULATORY_CARE_PROVIDER_SITE_OTHER): Payer: Medicaid Other | Admitting: Psychology

## 2014-08-02 DIAGNOSIS — F3170 Bipolar disorder, currently in remission, most recent episode unspecified: Secondary | ICD-10-CM

## 2014-08-02 NOTE — Assessment & Plan Note (Signed)
Report of mood is euthymic.  Affect is consistent.  He reports feeling irritable occasionally - especially with a crowded house.  Considering kicking his 47 year old son out because he believes he is, in part, enabling him.  He thinks this would decrease his irritability some.  Relationship with both sons remains good however.  Discussed what he might need to do regarding grief.  [name] thinks he is stuck - unable to really let go and grieve his father.  Thinks if he did, he would feel less anger and regret.  He has no idea how to get there.  Attempts to intervene here have not been fruitful.  Grief does not seem to interfere with function in any way.  Will continue to monitor.  Attempted to reframe grief as a process and that where he currently is may just where he needs to be.    No change in caffeine intake.  No desire to change this.  Did not revisit benzodiazepine use.    He will follow in about two weeks - half way between now and his follow-up [facility] visit.

## 2014-08-02 NOTE — Progress Notes (Signed)
Timothy [name] presents for follow-up.  He reports things are going well.  He likes the decrease in the Buspar - thinks it makes him less irritable.  Occasionally only taking two a day rather than three secondary to forgetting mid-day dose.  We took a look at various aspects of his life including his relationships with his two sons and what he can do to parent them well and his grief with his dad.

## 2014-08-11 ENCOUNTER — Other Ambulatory Visit: Payer: Self-pay | Admitting: *Deleted

## 2014-08-11 NOTE — Telephone Encounter (Signed)
Spoke with patient to reschedule his appt for tomorrow 08/12/14 due to weather.  He would like to know if he can get a refill on his pain medication due to being out since 07/25/14.  He states that he signed the pain contract and is unable to see anyone else for this refill.  Please advise.  Jazmin Hartsell,CMA

## 2014-08-12 ENCOUNTER — Ambulatory Visit: Payer: Medicaid Other | Admitting: Family Medicine

## 2014-08-14 NOTE — Telephone Encounter (Signed)
I will come by the office tomorrow morning to print him this refill.

## 2014-08-15 MED ORDER — OXYCODONE-ACETAMINOPHEN 5-325 MG PO TABS: 1.0000 | ORAL_TABLET | Freq: Three times a day (TID) | ORAL | Status: DC | PRN

## 2014-08-15 NOTE — Telephone Encounter (Signed)
Pt is aware.  Jazmin Hartsell,CMA  

## 2014-08-15 NOTE — Telephone Encounter (Signed)
Script waiting for patient up front.

## 2014-08-17 ENCOUNTER — Ambulatory Visit (INDEPENDENT_AMBULATORY_CARE_PROVIDER_SITE_OTHER): Payer: Medicaid Other | Admitting: Psychology

## 2014-08-17 ENCOUNTER — Encounter: Payer: Self-pay | Admitting: Family Medicine

## 2014-08-17 ENCOUNTER — Ambulatory Visit (INDEPENDENT_AMBULATORY_CARE_PROVIDER_SITE_OTHER): Payer: Medicaid Other | Admitting: Family Medicine

## 2014-08-17 VITALS — BP 142/85 | HR 113 | Temp 97.6°F | Ht 70.0 in | Wt 188.9 lb

## 2014-08-17 DIAGNOSIS — J209 Acute bronchitis, unspecified: Secondary | ICD-10-CM

## 2014-08-17 DIAGNOSIS — F3170 Bipolar disorder, currently in remission, most recent episode unspecified: Secondary | ICD-10-CM

## 2014-08-17 MED ORDER — PREDNISONE 50 MG PO TABS: ORAL_TABLET | ORAL | Status: DC

## 2014-08-17 MED ORDER — AZITHROMYCIN 250 MG PO TABS: ORAL_TABLET | ORAL | Status: DC

## 2014-08-17 NOTE — Patient Instructions (Addendum)
Thank you for coming in,   I have prescribed prednisone and azithromycin.   I would consider getting some probiotics to take while you are taking with the antibiotics.    Please feel free to call with any questions or concerns at any time, at [phone]. --Dr. Raeford Razor

## 2014-08-17 NOTE — Assessment & Plan Note (Addendum)
No formal diagnosis of COPD but most likely the has this. Increase sputum production with cough. Reports this is similar to previous episodes. Noncompliant with controller medications.  - azithromycin 500 then 250 thereafter  - prednisone  - albuterol 2 puffs every 4 hours for the next two days while awake.  - f/u if no improvement. Consider CXR.  - may need referral for PFT's at some point.

## 2014-08-17 NOTE — Assessment & Plan Note (Signed)
Met with him for about 5 minutes to say hello and provide some minimal support.  He is not in a good place for a therapy appointment today and we want to make sure he gets seen as quickly as possible by the [facility] doctor.  He has a follow-up for [facility] on 2/3.  We can schedule a follow-up beh med appointment at that time.

## 2014-08-17 NOTE — Progress Notes (Signed)
Timothy [name] called to cancel his appointment secondary to illness and then ended up scheduling an SDA at 9:30.  He called to see if I wanted to meet with him before his appointment.  Has been vomiting the past two days.  Thinks he might have bronchitis and "feels like shit."  His son has been sick for a time.  He is smoking a bit less because of his illness but still smoking regularly.

## 2014-08-17 NOTE — Progress Notes (Signed)
    Subjective   Timothy [name] is a 49 y.o. male that presents for a same day visit  1. Cough: has been coughing continusouly started yesterday norning. He has had fever since Monday. He was having chills and night sweats. Having artharligia. Son recently diagnosed with bronchitis. Has used airborne, Copywriter, advertising plus, nyquil and these haven't helped. Didn't get the flu vaccine. Has post tussive emesis and it was brown but no blood. Smokes about 2 ppd. Tried albuterol last night and this morning and hasn't helped. Also on spiriva but not compliant. Has smoked since he was 49 years old.   History  Substance Use Topics  . Smoking status: Current Every Day Smoker -- 2.00 packs/day for 31 years    Types: Cigarettes    Start date: 07/22/1981  . Smokeless tobacco: Former Systems developer    Types: Chew     Comment: Current 2 PPD smoker  . 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

## 2014-08-24 ENCOUNTER — Ambulatory Visit (INDEPENDENT_AMBULATORY_CARE_PROVIDER_SITE_OTHER): Payer: Medicaid Other | Admitting: Psychology

## 2014-08-24 DIAGNOSIS — F3170 Bipolar disorder, currently in remission, most recent episode unspecified: Secondary | ICD-10-CM

## 2014-08-24 NOTE — Assessment & Plan Note (Signed)
Per Irineo's report, taking three Diazepam a day has not been all that helpful.  Dr. Tammi Klippel wondered whether decreasing the Buspar further might be effective.  Discussed and Timothy [name] agreed to try it.  Timothy [name] reported that taking an additional Diazepam is his main coping mechanism for managing his anxiety.  Dr. Tammi Klippel reiterated that he would not increase the dose of this medicine as he does not think it is helpful.  Timothy [name] was unable to come up with other strategies for managing his anxiety.  Revisited the idea of moving his body, voc rehab, and distraction (e.g. Via a pet).  Next therapy visit, I may print off a menu of things to do to manage anxiety.  He has historically not been able to generate any options on his own.  Will follow in [facility] in about 6 weeks and [facility] in one week.

## 2014-08-24 NOTE — Progress Notes (Signed)
Timothy [name] presents for follow-up.  He brings his medicine with him and is requesting a refill for Diazepam.  It was filled on January 14th and he has 14 pills left.  He reports he has been occasionally taking three pills a day on his "really bad days."  Discussed.  He reports he has decreased his Diet Mountain Dew intake by about one (20 oz) a day.  He continues to drink about three cups of coffee and 4 diet Mountain Dews daily, including in the middle of the night.  Smokes about 2 ppd.  Not interested in making changes.

## 2014-08-24 NOTE — Patient Instructions (Addendum)
Please schedule a follow-up for March 16th at 9:00 for [facility]. Please schedule a follow-up for February 11th at 9:00 to see Dr. Gwenlyn Saran. Decrease Buspar to twice a day - once in the morning and once at night. If you continue to take three diazepam a day, you will run out early.  We will not refill this medicine early.

## 2014-08-26 ENCOUNTER — Encounter: Payer: Self-pay | Admitting: Family Medicine

## 2014-08-26 ENCOUNTER — Ambulatory Visit (INDEPENDENT_AMBULATORY_CARE_PROVIDER_SITE_OTHER): Payer: Medicaid Other | Admitting: Family Medicine

## 2014-08-26 VITALS — BP 142/70 | HR 114 | Temp 98.1°F | Ht 70.0 in | Wt 192.2 lb

## 2014-08-26 DIAGNOSIS — G8929 Other chronic pain: Secondary | ICD-10-CM

## 2014-08-26 DIAGNOSIS — R1010 Upper abdominal pain, unspecified: Secondary | ICD-10-CM

## 2014-08-26 DIAGNOSIS — R1011 Right upper quadrant pain: Secondary | ICD-10-CM

## 2014-08-26 DIAGNOSIS — Z7189 Other specified counseling: Secondary | ICD-10-CM

## 2014-08-26 MED ORDER — OXYCODONE-ACETAMINOPHEN 5-325 MG PO TABS: 1.0000 | ORAL_TABLET | Freq: Three times a day (TID) | ORAL | Status: DC | PRN

## 2014-08-26 NOTE — Patient Instructions (Signed)
It was a pleasure seeing you today in our clinic. Today we discussed your pain, and the future plan. Here is the treatment plan we have discussed and agreed upon together:   - continue working on home physical therapy.  - I would like to see you next month (first week). If you cannot get your own time slot I approve the addition of you to the end of my schedule. - See you in a month!

## 2014-08-27 LAB — DRUG SCR UR, PAIN MGMT, REFLEX CONF
Amphetamine Screen, Ur: NEGATIVE
Barbiturate Quant, Ur: NEGATIVE
COCAINE METABOLITES: NEGATIVE
Creatinine,U: 22.5 mg/dL
MARIJUANA METABOLITE: NEGATIVE
METHADONE: NEGATIVE
Opiates: NEGATIVE
PHENCYCLIDINE (PCP): NEGATIVE
PROPOXYPHENE: NEGATIVE

## 2014-08-30 NOTE — Assessment & Plan Note (Signed)
Due to our clinic schedule during the holidays patient went much longer w/o pain meds than normal. He and I discussed this and, although very uncomfortable, he has the capability to carry on w/o these meds. Otherwise he has no new changes from his baseline. - I have begun the weaning process. I wrote for 19 tablets in the middle of last month. At this visit I wrote for only 7 additional tablets just to get his to next month.  Future: I will continue to write for 19 tablets each month until patient is mentally ready to reduce this number (Goal: #18 by June)

## 2014-08-30 NOTE — Progress Notes (Signed)
   HPI  Patient presents today for med refill and checkup. Patient doing well. No new issues. He and I had long discussion about the new chronic pain practices here. He was understanding of these changes. Patient has been doing well. He continues to be somewhat non-compliant w/ regards to at-home physical therapy. No other concerns at this time.  Denies HA, vision change, n/v/d, urinary symptoms, fever, chills, heat/cold intolerance. Endorses abdominal pain and some back pain.  Smoking status noted - still unready to quit ROS: Per HPI  Objective: BP 142/70 mmHg  Pulse 114  Temp(Src) 98.1 F (36.7 C) (Oral)  Ht 5\' 10"  (1.778 m)  Wt 87.181 kg (192 lb 3.2 oz)  BMI 27.58 kg/m2   (HTN and tachy notes: patient stated he was experiencing some added anxiety since waking this AM)  Gen: NAD, alert, cooperative with exam HEENT: NCAT, EOMI, PERRL CV: RRR, good S1/S2, no murmur Resp: CTABL, no wheezes, non-labored Abd: tenderness present. No new pain, BS present, no guarding or organomegaly; surgical scarring present Ext: No edema, warm Neuro: Alert and oriented, No gross deficits  Assessment and plan:  Abdominal pain, chronic, diffuse Due to our clinic schedule during the holidays patient went much longer w/o pain meds than normal. He and I discussed this and, although very uncomfortable, he has the capability to carry on w/o these meds. Otherwise he has no new changes from his baseline. - I have begun the weaning process. I wrote for 19 tablets in the middle of last month. At this visit I wrote for only 7 additional tablets just to get his to next month.  Future: I will continue to write for 19 tablets each month until patient is mentally ready to reduce this number (Goal: #18 by June)   Encounter for chronic pain management See above.  Indication for chronic opioid: Extensive abdominal surgery history Medication and dose: Oxycodone 5/325 mg # pills per month: 19 Last UDS date:  08/26/14 Pain contract signed (Y/N): Yes Date narcotic database last reviewed (include red flags): Never by this provider.     Orders Placed This Encounter  Procedures  . Drug Scr Ur, Pain Mgmt, Reflex Conf  . Benzodiazepines (GC/LC/MS), urine    Meds ordered this encounter  Medications  . oxyCODONE-acetaminophen (ROXICET) 5-325 MG per tablet    Sig: Take 1 tablet by mouth every 8 (eight) hours as needed for severe pain.    Dispense:  7 tablet    Refill:  0     Elberta Leatherwood, MD,MS,  PGY1 08/30/2014 3:51 PM

## 2014-08-30 NOTE — Assessment & Plan Note (Addendum)
See above.  Indication for chronic opioid: Extensive abdominal surgery history Medication and dose: Oxycodone 5/325 mg # pills per month: 19 Last UDS date: 08/26/14 Pain contract signed (Y/N): Yes Date narcotic database last reviewed (include red flags): Never by this provider.

## 2014-08-31 LAB — BENZODIAZEPINES (GC/LC/MS), URINE
Alprazolam metabolite (GC/LC/MS), ur confirm: NEGATIVE ng/mL (ref <25)
Clonazepam metabolite (GC/LC/MS), ur confirm: NEGATIVE ng/mL (ref <25)
Flurazepam metabolite (GC/LC/MS), ur confirm: NEGATIVE ng/mL (ref <50)
LORAZEPAMU: NEGATIVE ng/mL (ref <50)
Midazolam (GC/LC/MS), ur confirm: NEGATIVE ng/mL (ref <50)
Nordiazepam (GC/LC/MS), ur confirm: 142 ng/mL — ABNORMAL HIGH (ref <50)
OXAZEPAMU: 178 ng/mL — AB (ref <50)
TEMAZEPAMU: 139 ng/mL — AB (ref <50)
TRIAZOLAMU: NEGATIVE ng/mL (ref <50)

## 2014-09-01 ENCOUNTER — Ambulatory Visit: Payer: Medicaid Other | Admitting: Psychology

## 2014-09-02 ENCOUNTER — Ambulatory Visit (INDEPENDENT_AMBULATORY_CARE_PROVIDER_SITE_OTHER): Payer: Medicaid Other | Admitting: Psychology

## 2014-09-02 ENCOUNTER — Encounter: Payer: Self-pay | Admitting: Psychology

## 2014-09-02 DIAGNOSIS — F3170 Bipolar disorder, currently in remission, most recent episode unspecified: Secondary | ICD-10-CM

## 2014-09-02 NOTE — Progress Notes (Signed)
Timothy [name] presents for follow-up.  He is feeling agitated and in pain.  He noticed a "ball" in his stomach a few days ago.  It is tender to the touch and throbbing otherwise.  He plans to call his surgeon to discuss.  Offered him up a SDA here but he declined.    Discussed his anxiety / agitation in general and specifically with regards to his older son who lives with him.  Timothy [name] has told him he needs to find another place to live and plans to kick him out at the end of March regardless.  His son has friends over at the house and in general, just tends to increase Timothy [name]'s level of agitation.    Timothy [name] is excited about a possible school solution for [place].  It is called [employer].  It is an alternative to traditional public school that might be an especially good Orthoptist for [place].

## 2014-09-02 NOTE — Assessment & Plan Note (Signed)
Report of mood is "stressed" and agitated.  Affect is reasonable in the room.  He laughs appropriately; expresses excitement about [name]'s school.  Does not appear particularly "short" or "restless" with me.    Reviewed coping for anxiety.  Agreed his #1 coping mech is taking a Valium.  He was able to repeat back what he has heard [place] treatment team say about this medicine.  Dr. Tammi Klippel won't increase it.  Reviewed attempts to develop other coping mechanisms.  Provided him a list of 10 (a menu) that I asked if he would be willing to stick on his refrigerator.  These included evidenced based strategies such as:  Meditation, diaphragmatic breathing, reaching out to social supports, exercise, laughter, listening to music, practicing gratitudes, and engaging pleasant activities.  Reviewed some and looked for ways he might be able to enact.  Will follow in three weeks to check-in.  He can call as needed.

## 2014-09-07 ENCOUNTER — Ambulatory Visit (INDEPENDENT_AMBULATORY_CARE_PROVIDER_SITE_OTHER): Payer: Medicaid Other | Admitting: Family Medicine

## 2014-09-07 ENCOUNTER — Encounter: Payer: Self-pay | Admitting: Family Medicine

## 2014-09-07 VITALS — BP 166/92 | HR 105 | Temp 98.0°F | Ht 70.0 in | Wt 189.1 lb

## 2014-09-07 DIAGNOSIS — R1032 Left lower quadrant pain: Secondary | ICD-10-CM

## 2014-09-07 NOTE — Progress Notes (Signed)
Reviewed and discussed with resident. 

## 2014-09-07 NOTE — Assessment & Plan Note (Addendum)
Reassuring history and exam, however due to multiple surgeries exam likely limited due to scar tissue and adhesions. Pain after lifting heavy object which patient states he knows he is not supposed to do. Does not feel like incarcerated hernia, and pt has no other systemic signs like n/v/d/bloody stools. Pt did not request any additional narcotic pain meds. Will elect to wait and observe, may have strained muscle or surgical adhesions with strain. Pt has f/u with surgeon on Friday/2 days. Pt given strict return precautions given his extensive abdominal surgical history.

## 2014-09-07 NOTE — Progress Notes (Signed)
   Subjective:    Patient ID: Timothy [name], male    DOB: 1965/08/26, 49 y.o.   MRN: [id number]  HPI  Patient presents for Same Day Appointment  CC: abdominal pain  # Abdominal pain:  Pt has hx chronic abdominal pain, multiple surgeries including intestine/colon resection for diverticulitis, colostomy and reversal, and in October 2014 had large ventral hernia repair.   Pt also on chronic opioids for chronic abdominal pain  States yesterday he was helping father lift a heavy object and felt something give, has has left sided pain since that time  Pain is 6/10, achy and then gets sharp at times  Worsened by movement, unrelieved by percocet taken this morning  Had a normal bowel movement today, no blood or dark stool  No nausea or vomiting ROS: no fevers/chills  Review of Systems   See HPI for ROS. All other systems reviewed and are negative.  Past medical history, surgical, family, and social history reviewed and updated in the EMR as appropriate.  Objective:  BP 166/92 mmHg  Pulse 105  Temp(Src) 98 F (36.7 C) (Oral)  Ht 5\' 10"  (1.778 m)  Wt 189 lb 2 oz (85.787 kg)  BMI 27.14 kg/m2 Vitals and nursing note reviewed  General: NAD, appears to be in pain when moves to lay down CV: borderline tachycardia, regular rhythm, normal heart sounds, no mrg. 2+ radial pulses  Resp: clear bilaterally Abdomen: well healed midline surgical scar and left sided transverse scar above level of umbilicus. Tender primarily left abdomen, multiple palpable masses consistent with surgical scars and feel less likely to be hernia, no rebound or guarding. Normal bowel sounds Ext: no edema or cyanosis   Assessment & Plan:  See Problem List Documentation

## 2014-09-07 NOTE — Patient Instructions (Addendum)
Reasons to call doctor or go to the ED immediately: severe pain that continues to worsen and unrelieved by pain medications, develop nausea and vomiting, bloody or dark stools, you do not have any bowel movement for more than a day.   In the meantime, no physical activity and just do bed rest.

## 2014-09-09 ENCOUNTER — Other Ambulatory Visit (INDEPENDENT_AMBULATORY_CARE_PROVIDER_SITE_OTHER): Payer: Self-pay | Admitting: General Surgery

## 2014-09-09 DIAGNOSIS — R109 Unspecified abdominal pain: Secondary | ICD-10-CM

## 2014-09-16 ENCOUNTER — Ambulatory Visit
Admission: RE | Admit: 2014-09-16 | Discharge: 2014-09-16 | Disposition: A | Discharge status: Home / Self Care | Payer: Medicaid Other | Source: Ambulatory Visit | Attending: General Surgery | Admitting: General Surgery

## 2014-09-16 MED ORDER — IOHEXOL 300 MG/ML  SOLN
100.0000 mL | Freq: Once | INTRAMUSCULAR | Status: AC | PRN
  Administered 2014-09-16: 100 mL via INTRAVENOUS

## 2014-09-23 ENCOUNTER — Ambulatory Visit (INDEPENDENT_AMBULATORY_CARE_PROVIDER_SITE_OTHER): Payer: Medicaid Other | Admitting: Psychology

## 2014-09-23 ENCOUNTER — Encounter: Payer: Self-pay | Admitting: Psychology

## 2014-09-23 DIAGNOSIS — F3170 Bipolar disorder, currently in remission, most recent episode unspecified: Secondary | ICD-10-CM

## 2014-09-23 NOTE — Assessment & Plan Note (Signed)
Report of mood is mostly euthymic with some report of mild irritability.  Affect is within normal limits.  He seems to benefit from touching base.  He did not appear defensive around the smoking conversation.  There is no movement here.  It should continue to be addressed.  As I have said before, he is not interested in changing much of anything.  I let him know that the best bet moving forward is likely for me to touch base with him at the end of his visits with Dr. Alease Frame.  Our Integrated Care program should kick off in April and I can ask that Timothy [name] schedule his appointments on the morning I will be doing this as a way to continue to assess motivation for change.

## 2014-09-23 NOTE — Progress Notes (Signed)
Timothy [name] presents for follow-up.  He is on the backside of hurting his stomach.  He lifted a hot water heater at his step-dad's house and based on a CT scan, was told that he likely tore some scar tissue.  He is feeling better now.  He is out of pain medicine and has a follow-up on Monday with his PCP.  He has been weaning off the medicine and appears to be tolerating this well.  He does not use it daily.    His son has been gone for about a week and Timothy [name] notices that he is less irritable and more at peace.  He also recognizes that he misses his son a bit, despite the stress it causes.  His original plan was to force his son out of the house at the end of March.  Timothy [name] is not as confident any longer (6/10).  We reexamined the reasons this might be important including his health and a bit of a "tough love" approach with his son.    Revisited smoking including looking at the benefits for him (keeps him calm) and drawbacks.  He recognizes he is using his inhaler more.  He thinks "the damage is done."  Touched on the progressiveness of COPD.  He sees this as being a ways off for him and does not seem all that perplexed by the possibility of requiring O2 in the future.    Still considering the alternative school program for [facility] [employer] which sounds like would be a good match.    Touched on the coping mechanisms I printed out for him last visit.  They did not make it to his refrigerator.

## 2014-09-26 ENCOUNTER — Ambulatory Visit (INDEPENDENT_AMBULATORY_CARE_PROVIDER_SITE_OTHER): Payer: Medicaid Other | Admitting: Family Medicine

## 2014-09-26 ENCOUNTER — Encounter: Payer: Self-pay | Admitting: Family Medicine

## 2014-09-26 VITALS — BP 136/73 | HR 108 | Ht 70.0 in | Wt 186.2 lb

## 2014-09-26 DIAGNOSIS — G8929 Other chronic pain: Secondary | ICD-10-CM

## 2014-09-26 DIAGNOSIS — R1011 Right upper quadrant pain: Secondary | ICD-10-CM

## 2014-09-26 DIAGNOSIS — R059 Cough, unspecified: Secondary | ICD-10-CM | POA: Insufficient documentation

## 2014-09-26 DIAGNOSIS — Z7189 Other specified counseling: Secondary | ICD-10-CM

## 2014-09-26 DIAGNOSIS — R05 Cough: Secondary | ICD-10-CM | POA: Insufficient documentation

## 2014-09-26 DIAGNOSIS — R1013 Epigastric pain: Secondary | ICD-10-CM

## 2014-09-26 DIAGNOSIS — J441 Chronic obstructive pulmonary disease with (acute) exacerbation: Secondary | ICD-10-CM

## 2014-09-26 DIAGNOSIS — R0602 Shortness of breath: Secondary | ICD-10-CM

## 2014-09-26 DIAGNOSIS — R1010 Upper abdominal pain, unspecified: Secondary | ICD-10-CM

## 2014-09-26 MED ORDER — LOSARTAN POTASSIUM 100 MG PO TABS: 100.0000 mg | ORAL_TABLET | Freq: Every day | ORAL | Status: DC

## 2014-09-26 MED ORDER — OXYCODONE-ACETAMINOPHEN 5-325 MG PO TABS: 1.0000 | ORAL_TABLET | Freq: Three times a day (TID) | ORAL | Status: DC | PRN

## 2014-09-26 MED ORDER — ALBUTEROL SULFATE HFA 108 (90 BASE) MCG/ACT IN AERS: 2.0000 | INHALATION_SPRAY | Freq: Four times a day (QID) | RESPIRATORY_TRACT | Status: DC | PRN

## 2014-09-26 MED ORDER — CYCLOBENZAPRINE HCL 10 MG PO TABS: 10.0000 mg | ORAL_TABLET | Freq: Three times a day (TID) | ORAL | Status: DC | PRN

## 2014-09-26 NOTE — Progress Notes (Signed)
HPI  Patient presents today for medication refill and cough. Patient is here today for a medication refill and cough he states that the cough that we discussed at his previous visit is still present. Patient had been seen couple weeks ago by his general surgeon due to increased pain in his abdomen. Otherwise there are been no significant changes since I last saw him. Regarding the cough patient denies any fevers chills shortness of breath headaches fatigue weakness chest pain. Cough is dry and present daily. Cough is not improved since our last visit nor has it worsened.  Patient states that he has increased his use of his albuterol inhaler. He also states that he discontinued taking his Spiriva due to his belief that it was ineffective. No other issues at this time.  Smoking status noted ROS: Per HPI  Objective: BP 136/73 mmHg  Pulse 108  Ht 5\' 10"  (1.778 m)  Wt 186 lb 3.2 oz (84.46 kg)  BMI 26.72 kg/m2 Gen: NAD, alert, cooperative with exam HEENT: NCAT, EOMI, PERRL CV: RRR, good S1/S2, no murmur Resp: CTABL, no wheezes, non-labored Abd: Extensive starring present. SNTND, BS present, no guarding or organomegaly Ext: No edema, warm Neuro: Alert and oriented, No gross deficits  Assessment and plan:  Encounter for chronic pain management Refill for pain medication.  Patient continues to have chronic abdominal pain. He was seen by his general surgeon due to an exacerbation for this pain. He was not prescribed any additional narcotics at that time. CT of his abdomen was obtained to rule out any acute source for the pain >> CT was negative for acute processes.   19 tablet prescription written for oxycodone 5/325. We will continue to attempt weaning this down over time.    Cough Cough has been ongoing for the past 2 months. At this time I have a high suspicion that this cough is secondary to his current ACE inhibitor. Today I deemed it necessary to discontinue this medication from  his regimen.  I have replaced patient's lisinopril 40 mg with losartan 100 mg. I will recheck patient's blood pressure as well as his cough in 4 weeks.  If patient's cough persists and this may be due to gastric reflux, although patient is not currently complaining of any other reflux symptoms. If cough persists until our next office visit I will likely add a PPI to his treatment regimen.  Next Steps: - Addition of PPI - CT chest (only if PPI doesn't work)    Meds ordered this encounter  Medications  . cyclobenzaprine (FLEXERIL) 10 MG tablet    Sig: Take 1 tablet (10 mg total) by mouth 3 (three) times daily as needed for muscle spasms.    Dispense:  30 tablet    Refill:  2  . oxyCODONE-acetaminophen (ROXICET) 5-325 MG per tablet    Sig: Take 1 tablet by mouth every 8 (eight) hours as needed for severe pain.    Dispense:  19 tablet    Refill:  0  . albuterol (PROVENTIL HFA;VENTOLIN HFA) 108 (90 BASE) MCG/ACT inhaler    Sig: Inhale 2 puffs into the lungs every 6 (six) hours as needed for wheezing or shortness of breath.    Dispense:  1 Inhaler    Refill:  2  . losartan (COZAAR) 100 MG tablet    Sig: Take 1 tablet (100 mg total) by mouth daily.    Dispense:  90 tablet    Refill:  3     Marylynn Pearson  McKeag, MD,MS,  PGY1 09/26/2014 5:42 PM

## 2014-09-26 NOTE — Patient Instructions (Signed)
It was a pleasure seeing you today in our clinic. Today we discussed your cough, pain, and breathing. Here is the treatment plan we have discussed and agreed upon together:   - I have changed your Lisinopril today to Losartan. You are to take this once a day, just like the previous medication. Do NOT continue to take your Lisinopril. - I would like you to restart taking your Spiriva inhaler. This should help reduce the amount of albuterol you require. - If your cough continues I have a couple more ideas I can try. But for now, lets try these. See you next month.

## 2014-09-26 NOTE — Assessment & Plan Note (Signed)
Refill for pain medication.  Patient continues to have chronic abdominal pain. He was seen by his general surgeon due to an exacerbation for this pain. He was not prescribed any additional narcotics at that time. CT of his abdomen was obtained to rule out any acute source for the pain >> CT was negative for acute processes.   19 tablet prescription written for oxycodone 5/325. We will continue to attempt weaning this down over time.

## 2014-09-26 NOTE — Assessment & Plan Note (Signed)
Cough has been ongoing for the past 2 months. At this time I have a high suspicion that this cough is secondary to his current ACE inhibitor. Today I deemed it necessary to discontinue this medication from his regimen.  I have replaced patient's lisinopril 40 mg with losartan 100 mg. I will recheck patient's blood pressure as well as his cough in 4 weeks.  If patient's cough persists and this may be due to gastric reflux, although patient is not currently complaining of any other reflux symptoms. If cough persists until our next office visit I will likely add a PPI to his treatment regimen.  Next Steps: - Addition of PPI - CT chest (only if PPI doesn't work)

## 2014-09-28 ENCOUNTER — Telehealth: Payer: Self-pay | Admitting: Family Medicine

## 2014-09-28 NOTE — Telephone Encounter (Signed)
Pt called and stated that his last visit, Dr. Alease Frame recommended a chest xray because of his continuous cough and  He would like to go ahead and have that xray scheduled for him because his cough is not getting better. / sr

## 2014-09-30 ENCOUNTER — Other Ambulatory Visit: Payer: Self-pay | Admitting: Family Medicine

## 2014-09-30 DIAGNOSIS — J441 Chronic obstructive pulmonary disease with (acute) exacerbation: Secondary | ICD-10-CM

## 2014-09-30 NOTE — Telephone Encounter (Signed)
Please call patient to inform him that an order for a chest xray was placed. He can get this at [facility].  Thanks!

## 2014-09-30 NOTE — Telephone Encounter (Signed)
Pt calling back per provider's instruction about getting this chest xray.  Still haven't heard anything from office about it.  Patient having persistent coughing throughout day and night.  Unable to sleep.

## 2014-09-30 NOTE — Telephone Encounter (Signed)
Spoke with patient and he is aware of this. Jazmin Hartsell,CMA

## 2014-10-03 ENCOUNTER — Ambulatory Visit
Admission: RE | Admit: 2014-10-03 | Discharge: 2014-10-03 | Disposition: A | Discharge status: Home / Self Care | Payer: Medicaid Other | Source: Ambulatory Visit | Attending: Family Medicine | Admitting: Family Medicine

## 2014-10-03 DIAGNOSIS — J441 Chronic obstructive pulmonary disease with (acute) exacerbation: Secondary | ICD-10-CM

## 2014-10-05 ENCOUNTER — Ambulatory Visit (INDEPENDENT_AMBULATORY_CARE_PROVIDER_SITE_OTHER): Payer: Medicaid Other | Admitting: Psychology

## 2014-10-05 DIAGNOSIS — F3170 Bipolar disorder, currently in remission, most recent episode unspecified: Secondary | ICD-10-CM

## 2014-10-05 NOTE — Progress Notes (Signed)
Timothy [name] presents for follow-up.  He feels less irritable on two Buspar.  No other complaints.  He had a recent chest xray for SOB and is a little concerned about the results.  It has not changed his thoughts about smoking at all.  He says smoking is the only thing that calms his nerves.  He is not interested in trying other things.  Checked in on some social issues - both sons and how they are doing and what impact their choices are having on Timothy [name]'s mood and behavior.

## 2014-10-05 NOTE — Assessment & Plan Note (Signed)
Report of mood is euthymic today.  Affect is consistent.  The decrease in Buspar seems to have helped and Dr. Tammi Klippel wondered whether a further decrease might yield even more benefit.  Discussed and Timothy [name] was interested in experimenting this.    Treatment team believes we have reached the success we are going to with medication.  Attempts at behavioral interventions, consistently using motivational interviewing, have not been successful.  He has very set ideas on what is helpful.  He does seem to enjoy / benefit from the interaction.  Treatment team agreed to continue to manage his medicine as needed.  Gave six months of Valium (starting 11/20/2014 when he is due for additional prescriptions) and a year's worth of Buspar.  Will follow in September in [facility] or sooner if need be.  I will see him another time in beh med clinic prior to switching to an integrated care model where me or another [facility] will touch base with him during his appointments with his PCP.  Timothy [name] is in agreement with this plan.  See patient instructions for further plan.

## 2014-10-05 NOTE — Patient Instructions (Signed)
Please schedule a follow-up for [facility] on September 21st at 9:00.  Please schedule a follow up to see me (beh med):  March 29th at 9:00. You thought experimenting with your Buspar was reasonable.  Plan is to drop down to one a day (at night) to see if you notice a decrease in irritability.  If you feel worse, bump back up to two.

## 2014-10-18 ENCOUNTER — Ambulatory Visit (INDEPENDENT_AMBULATORY_CARE_PROVIDER_SITE_OTHER): Payer: Medicaid Other | Admitting: Psychology

## 2014-10-18 DIAGNOSIS — F3170 Bipolar disorder, currently in remission, most recent episode unspecified: Secondary | ICD-10-CM

## 2014-10-18 NOTE — Assessment & Plan Note (Signed)
Report of mood is euthymic.  Affect is consistent.  He appears relaxed and content.  I really think the increased activity / purpose at his parents' house has helped.  He gets bored when he doesn't have something to do and ends up getting irritable.  As Dr. Tammi Klippel and I have discussed, we have likely done what we can from a medication standpoint and the rest will need to come from behavioral change - something that does not come easy to [name].  He is aware of my shifting model of care and that I will be doing Integrated Care on Monday and Friday mornings.  Right now, his PCP is usually only in clinic in the afternoon.  Timothy [name] knows he can call to schedule if something appreciable changes.  He has a follow-up in [facility] in September.

## 2014-10-18 NOTE — Progress Notes (Signed)
Timothy [name] presents for follow-up.  He did not make the change in his medication that was last discussed in [facility].  He was possibly going to experiment with going to one Buspar per day to see if that further decreased his irritability.  He is taking one in the morning and one at night and thinks this is going well.  Overall, he reports feeling well ("stable").  His mom had a knee replacement about a week ago and he has been going over there daily to help with household chores.  Discussed the role that purpose / task completion has in helping him to feel better and less irritable.  Reviewed his housing situation and the school issue with [name].  Timothy [name] appears to have a fairly good handle on things.  Spring time is a good time of year for him.

## 2014-10-24 ENCOUNTER — Encounter: Payer: Self-pay | Admitting: Family Medicine

## 2014-10-24 ENCOUNTER — Ambulatory Visit (INDEPENDENT_AMBULATORY_CARE_PROVIDER_SITE_OTHER): Payer: Medicaid Other | Admitting: Family Medicine

## 2014-10-24 VITALS — BP 130/82 | Temp 97.9°F | Ht 70.0 in | Wt 180.0 lb

## 2014-10-24 DIAGNOSIS — R1011 Right upper quadrant pain: Principal | ICD-10-CM

## 2014-10-24 DIAGNOSIS — G8929 Other chronic pain: Secondary | ICD-10-CM

## 2014-10-24 DIAGNOSIS — I10 Essential (primary) hypertension: Secondary | ICD-10-CM

## 2014-10-24 DIAGNOSIS — R1010 Upper abdominal pain, unspecified: Secondary | ICD-10-CM

## 2014-10-24 MED ORDER — OXYCODONE-ACETAMINOPHEN 5-325 MG PO TABS: 1.0000 | ORAL_TABLET | Freq: Three times a day (TID) | ORAL | Status: DC | PRN

## 2014-10-24 NOTE — Progress Notes (Signed)
   HPI  Patient presents today for follow-up  Chronic pain: - persists but improved. Worse in evening. Typically 3-4/10. Worse in lower quadrants. Not issues w/ BMs. Not performing home PT. Sends most of day outside now that weather improved. Feeling better about continuing to wean pain medications.  Cough: - Cough no longer present since changing to ARB. CXR last month is clear for acute process. No issues w/ SOB CP orthopnea wheezing dizziness lightheadedness.   Smoking status noted: continues. ROS: Per HPI  Objective: BP 130/82 mmHg  Temp(Src) 97.9 F (36.6 C) (Oral)  Ht 5\' 10"  (1.778 m)  Wt 180 lb (81.647 kg)  BMI 25.83 kg/m2 Gen: NAD, alert, cooperative with exam HEENT: NCAT, EOMI CV: RRR, good S1/S2, no murmur Resp: CTABL, end-exper wheezes, non-labored Abd: S, ND,TTP throughout--worse at LLQ, BS present, no guarding or organomegaly Ext: No edema, warm Neuro: Alert and oriented, No gross deficits  Assessment and plan:  Abdominal pain, chronic, diffuse Patient with chronic abdom pain. Worse in evening. Pain is 3-4/10 typically. Pain control has been improving. Hx substance abuse. - Script for Oxycodone 5/325 #18 tabs.  - this is down from #19 tabs. Patient has agreed to continue the attempts to decrease the amount of narcotics he is taking.   - Has not been performing home PT at this time. I continue to encourage this.  - patient informed to call our office if pain worsens or report to urgent care if it is after hours.   Essential hypertension, benign Transitioned to Cozaar 100mg  last month 2/2 persistent cough. - 130/82 today. - cough improved/resolved. - continue regimen.     No orders of the defined types were placed in this encounter.    Meds ordered this encounter  Medications  . oxyCODONE-acetaminophen (ROXICET) 5-325 MG per tablet    Sig: Take 1 tablet by mouth every 8 (eight) hours as needed for severe pain.    Dispense:  18 tablet    Refill:  0      Elberta Leatherwood, MD,MS,  PGY1 10/24/2014 5:46 PM

## 2014-10-24 NOTE — Assessment & Plan Note (Addendum)
Patient with chronic abdom pain. Worse in evening. Pain is 3-4/10 typically. Pain control has been improving. Hx substance abuse. - Script for Oxycodone 5/325 #18 tabs.  - this is down from #19 tabs. Patient has agreed to continue the attempts to decrease the amount of narcotics he is taking.   - Has not been performing home PT at this time. I continue to encourage this.  - patient informed to call our office if pain worsens or report to urgent care if it is after hours.

## 2014-10-24 NOTE — Patient Instructions (Signed)
It was a pleasure seeing you today in our clinic. Today we discussed pain, and cough. Here is the treatment plan we have discussed and agreed upon together:   - As discussed in the clinic your Chest X-ray looks good. There are some changes seen that are likely due to your long history of smoking but no significant changes seen by me today that is concerning for anything severe. - I have refilled your Oxycodone. I am thrilled you felt comfortable enough to reduce this refill to 18 tablets (from 19). You are making great progress--keep this up! -I'd like to see you back next month. Keep staying active. Get outside and keep on your feet. Enjoy the weather!

## 2014-10-24 NOTE — Assessment & Plan Note (Signed)
Transitioned to Cozaar 100mg  last month 2/2 persistent cough. - 130/82 today. - cough improved/resolved. - continue regimen.

## 2014-11-25 ENCOUNTER — Ambulatory Visit: Payer: Medicaid Other | Admitting: Family Medicine

## 2014-11-25 ENCOUNTER — Ambulatory Visit (INDEPENDENT_AMBULATORY_CARE_PROVIDER_SITE_OTHER): Payer: Medicaid Other | Admitting: Family Medicine

## 2014-11-25 VITALS — BP 138/88 | HR 91 | Temp 97.6°F | Ht 70.0 in | Wt 174.0 lb

## 2014-11-25 DIAGNOSIS — F3170 Bipolar disorder, currently in remission, most recent episode unspecified: Secondary | ICD-10-CM

## 2014-11-25 DIAGNOSIS — R1011 Right upper quadrant pain: Principal | ICD-10-CM

## 2014-11-25 DIAGNOSIS — Z7189 Other specified counseling: Secondary | ICD-10-CM | POA: Diagnosis not present

## 2014-11-25 DIAGNOSIS — G8929 Other chronic pain: Secondary | ICD-10-CM

## 2014-11-25 DIAGNOSIS — R1010 Upper abdominal pain, unspecified: Secondary | ICD-10-CM

## 2014-11-25 MED ORDER — OXYCODONE-ACETAMINOPHEN 5-325 MG PO TABS: 1.0000 | ORAL_TABLET | Freq: Three times a day (TID) | ORAL | Status: DC | PRN

## 2014-11-25 NOTE — Assessment & Plan Note (Signed)
No change from previous exam. Patient continues to try to reduce his monthly opiate use. He and I have developed a good/trusting relationship w/ this and he appears to be comfortable w/ letting me know if he doesn't feel ready to reduce his total opiate prescription at this time. This is a work in progress which will continue and has been trending in the right direction currently -Refilled Oxycodone - Encouraged at-home PT exercises.

## 2014-11-25 NOTE — Patient Instructions (Signed)
It was a pleasure seeing you today in our clinic. Today we discussed your pain. Here is the treatment plan we have discussed and agreed upon together:   - Keep up the effort to stay healthy! - As we discussed, I'd like you to try to get outside this weekend and spend some time in the sun. Trying to add some extra physical activity will always help but just being outside can really improve your mood.  - Never hesitate to contact me if you have any questions or problem come up. It was great seeing you in here today.

## 2014-11-25 NOTE — Progress Notes (Signed)
   HPI  CC: Chronic pain and med refill  # chronic abdominal pain:  Continues to be bothered by the "knot" in the LLQ.   No significant changes from prior visit.  Has been physically active mowing his lawn>> has to mow in sections due to pain and some fatigue. ROS: no fevers, chills, HA, vision change, CP, SOB, constipation, diarrhea  # Tobacco Abuse  No change from before  Continues to be refractory to any attempts to quit ROS: occasional deep cough (sometimes productive). No SOB, orthopnea, hemoptysis, fatigue, malaise, body aches  Review of Systems   See HPI for ROS. All other systems reviewed and are negative.  Past medical history and social history reviewed and updated in the EMR as appropriate.  Objective: BP 138/88 mmHg  Pulse 91  Temp(Src) 97.6 F (36.4 C) (Oral)  Ht 5\' 10"  (1.778 m)  Wt 174 lb (78.926 kg)  BMI 24.97 kg/m2 Gen: NAD, alert, cooperative, and pleasant. HEENT: NCAT, EOMI, PERRL CV: RRR, no murmur Resp: CTAB, mild wheezing noted w/ deep inspiration.  Abd: Soft, diffuse tenderness (greatest @LLQ ), extensive surgical scarring present, BS present, no guarding or organomegaly Ext: No edema, warm Neuro: Alert and oriented, Speech clear, No gross deficits  Assessment and plan:  Abdominal pain, chronic, diffuse No change from previous exam. Patient continues to try to reduce his monthly opiate use. He and I have developed a good/trusting relationship w/ this and he appears to be comfortable w/ letting me know if he doesn't feel ready to reduce his total opiate prescription at this time. This is a work in progress which will continue and has been trending in the right direction currently -Refilled Oxycodone - Encouraged at-home PT exercises.   Bipolar disorder Patient c/o some feelings of being "down". Doesn't really know why. Says he will be spending time w/ his mother this Sunday and that tends to be "exhausting" and "depressing" 2/2 her own  medical/psychological status. No SI/HI. Just feeling down, but he feels he'll likely "snap out of it" soon.  - Hold the course for medications - I let him know he can leave a message at our office if he ever needs me to call him. - I asked that he spend some time outside this weekend if the weather is nice. He had stated that he'd been spending more time indoors since his mood came down. - will f/u in one month    Meds ordered this encounter  Medications  . oxyCODONE-acetaminophen (ROXICET) 5-325 MG per tablet    Sig: Take 1 tablet by mouth every 8 (eight) hours as needed for severe pain.    Dispense:  18 tablet    Refill:  0     Elberta Leatherwood, MD,MS,  PGY1 11/25/2014 4:28 PM

## 2014-11-25 NOTE — Assessment & Plan Note (Signed)
Patient c/o some feelings of being "down". Doesn't really know why. Says he will be spending time w/ his mother this Sunday and that tends to be "exhausting" and "depressing" 2/2 her own medical/psychological status. No SI/HI. Just feeling down, but he feels he'll likely "snap out of it" soon.  - Hold the course for medications - I let him know he can leave a message at our office if he ever needs me to call him. - I asked that he spend some time outside this weekend if the weather is nice. He had stated that he'd been spending more time indoors since his mood came down. - will f/u in one month

## 2014-12-01 ENCOUNTER — Telehealth: Payer: Self-pay | Admitting: Psychology

## 2014-12-01 NOTE — Telephone Encounter (Signed)
Timothy [name] called to say he has been in to see Dr. Alease Frame on a Friday like we discussed but that he didn't get to see me.  He was speaking of the [place] that we have adopted.  He didn't mention it to Dr. Alease Frame (didn't know to) and Dr. Alease Frame wouldn't have know to get me.  Discussed continued appts on Monday or Friday.  I put it in my calendar for his follow-up to touch base around his appointment with Dr. Alease Frame for 15--20 minutes.  He reported some down mood but nothing major like "throwing stuff."  In the past, he has not been interested in changing anything - relying almost exclusively on medication adjustments to try to capture mood variability.  I told him if there was something specific that he wanted to change, I would be happy to schedule with him to discuss in greater depth.  Otherwise, I will touch base with him on his follow-up with Dr. Alease Frame.  He voiced an understanding and was agreeable.

## 2014-12-01 NOTE — Telephone Encounter (Signed)
Thanks for talking to him today, Dr. Gwenlyn Saran. I'd like to discuss plans for this patient with you whenever we both have a chance!

## 2014-12-30 ENCOUNTER — Encounter: Payer: Self-pay | Admitting: Family Medicine

## 2014-12-30 ENCOUNTER — Ambulatory Visit (INDEPENDENT_AMBULATORY_CARE_PROVIDER_SITE_OTHER): Payer: Medicaid Other | Admitting: Family Medicine

## 2014-12-30 VITALS — BP 132/81 | HR 93 | Temp 98.2°F | Wt 170.0 lb

## 2014-12-30 DIAGNOSIS — R1010 Upper abdominal pain, unspecified: Secondary | ICD-10-CM

## 2014-12-30 DIAGNOSIS — M533 Sacrococcygeal disorders, not elsewhere classified: Secondary | ICD-10-CM

## 2014-12-30 DIAGNOSIS — R1013 Epigastric pain: Secondary | ICD-10-CM | POA: Diagnosis not present

## 2014-12-30 DIAGNOSIS — G8929 Other chronic pain: Secondary | ICD-10-CM

## 2014-12-30 DIAGNOSIS — M461 Sacroiliitis, not elsewhere classified: Secondary | ICD-10-CM | POA: Diagnosis not present

## 2014-12-30 DIAGNOSIS — R1011 Right upper quadrant pain: Principal | ICD-10-CM

## 2014-12-30 HISTORY — DX: Sacrococcygeal disorders, not elsewhere classified: M53.3

## 2014-12-30 MED ORDER — OXYCODONE-ACETAMINOPHEN 5-325 MG PO TABS: 1.0000 | ORAL_TABLET | Freq: Three times a day (TID) | ORAL | Status: DC | PRN

## 2014-12-30 MED ORDER — NAPROXEN 500 MG PO TABS: 500.0000 mg | ORAL_TABLET | Freq: Two times a day (BID) | ORAL | Status: DC | PRN

## 2014-12-30 MED ORDER — CYCLOBENZAPRINE HCL 10 MG PO TABS: 10.0000 mg | ORAL_TABLET | Freq: Three times a day (TID) | ORAL | Status: DC | PRN

## 2014-12-30 NOTE — Progress Notes (Signed)
   HPI CC: Medication Refill and back pain  Patient has been having a significant amount of pain recently. He recently got a new puppy and believes he may have aggravated something while picking the puppy up last week. Denies any acute injury. Pain is located to his low back. He has some point tenderness there and occasional radiation down his Lt hamstring. Denies weakness or numbness. Will occasionally have muscle spasms in that area. Not better w/ muscle relaxants. Some improvement w/ NSAIDs. Has gone through more of his pain medication than usual due to this pain.  ROS: denies fever, chills, SOB, CP, numbness, weakness, paresthesias, incontinence.  Review of Systems   See HPI for ROS. All other systems reviewed and are negative.  Past medical history and social history reviewed and updated in the EMR as appropriate.  Objective: BP 132/81 mmHg  Pulse 93  Temp(Src) 98.2 F (36.8 C) (Oral)  Wt 170 lb (77.111 kg) Gen: NAD, alert, cooperative, and pleasant. HEENT: NCAT, EOMI, PERRL CV: RRR, no murmur Resp: CTAB, mild wheezing noted w/ deep inspiration.  Abd: Soft, generalized tenderness (at his baseline), extensive surgical scarring present, BS present, no guarding or organomegaly  Ext: No edema, warm, negative straight leg raise, TTP at Lt SI joint, flexion of Lt hip caused painful muscle spasms of the hip and low back. Strength 5/5. Neuro: Alert and oriented, Speech clear, No gross deficits  Assessment and plan:  SI (sacroiliac) joint inflammation Sxs most c/w SI joint irritation/inflammation likely 2/2 new onset bending and lifting of patient's new puppy (and deconditioning). No red flag symptoms reported. Back spasms likely 2/2 the inflamed region of the SI joint irritating the surrounding musculature.  - I have given a prescription for Naproxen 500mg  BID PRN w/ food. Encouraged him to be mindful of any abdominal/epigastric pain. Also informed him that I did not expect him to be on  this long term. 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  - refilled oxy and flexeril    No orders of the defined types were placed in this encounter.    Meds ordered this encounter  Medications  . naproxen (NAPROSYN) 500 MG tablet    Sig: Take 1 tablet (500 mg total) by mouth 2 (two) times daily as needed. With Food.    Dispense:  60 tablet    Refill:  0  . oxyCODONE-acetaminophen (ROXICET) 5-325 MG per tablet    Sig: Take 1 tablet by mouth every 8 (eight) hours as needed for severe pain.    Dispense:  20 tablet    Refill:  0  . cyclobenzaprine (FLEXERIL) 10 MG tablet    Sig: Take 1 tablet (10 mg total) by mouth 3 (three) times daily as needed for muscle spasms.    Dispense:  30 tablet    Refill:  1     Elberta Leatherwood, MD,MS,  PGY1 12/30/2014 6:30 PM

## 2014-12-30 NOTE — Assessment & Plan Note (Signed)
Sxs most c/w SI joint irritation/inflammation likely 2/2 new onset bending and lifting of patient's new puppy (and deconditioning). No red flag symptoms reported. Back spasms likely 2/2 the inflamed region of the SI joint irritating the surrounding musculature.  - I have given a prescription for Naproxen 500mg  BID PRN w/ food. Encouraged him to be mindful of any abdominal/epigastric pain. Also informed him that I did not expect him to be on this long term. 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  - refilled oxy and flexeril

## 2014-12-30 NOTE — Patient Instructions (Signed)
It was a pleasure seeing you today in our clinic. Today we discussed your pain. Here is the treatment plan we have discussed and agreed upon together:  - I believe you have some irritation of the SI joint. For this is have prescribed one month of Naproxen. This is to be taken only twice a day AS NEEDED. Please take this with food. - I have refilled your Flexeril, and Oxycodone as well.  - If you have any questions please don't hesitate to ask.

## 2015-01-05 ENCOUNTER — Ambulatory Visit (INDEPENDENT_AMBULATORY_CARE_PROVIDER_SITE_OTHER): Payer: Medicaid Other | Admitting: Family Medicine

## 2015-01-05 ENCOUNTER — Encounter: Payer: Self-pay | Admitting: Family Medicine

## 2015-01-05 VITALS — BP 139/89 | HR 91 | Temp 97.6°F | Ht 70.0 in | Wt 173.0 lb

## 2015-01-05 DIAGNOSIS — M461 Sacroiliitis, not elsewhere classified: Secondary | ICD-10-CM | POA: Diagnosis not present

## 2015-01-05 MED ORDER — METHYLPREDNISOLONE ACETATE 40 MG/ML IJ SUSP
40.0000 mg | Freq: Once | INTRAMUSCULAR | Status: AC
  Administered 2015-01-05: 40 mg via INTRA_ARTICULAR

## 2015-01-05 NOTE — Progress Notes (Signed)
   Subjective:    Patient ID: Timothy [name], male    DOB: 1966/04/17, 49 y.o.   MRN: [id number]  HPI 49 year old male with a past medical history of bipolar disorder, tobacco abuse, history of alcohol abuse, and chronic pain presents for follow-up regarding SI joint pain.  Patient reports that he continues to have right-sided SI joint pain. Pain is severe currently (7/10). Pain is sharp and constant. Pain radiates to the left hip and down the left leg.  He's been taking naproxen 500 mg twice a day and has been taking his home pain medication (Oxycodone 5-325 mg). He has had little relief in his pain with these medications.  It is exacerbated by physical activity and movement.  He is requesting refill on his pain medication today.  Social Hx - Current Everyday smoker.   Review of Systems  Constitutional: Negative for fever.  Musculoskeletal: Positive for back pain and arthralgias.      Objective:   Physical Exam Filed Vitals:   01/05/15 0922  BP: 139/89  Pulse: 91  Temp: 97.6 F (36.4 C)   Vital signs reviewed.  Exam: General: appears in pain, NAD. Back: Patient with point tenderness at the left SI joint. Spasm also noted in this area. Negative straight leg raise. Pain with FADIR.   Neuro: No focal deficits.  Procedure: Left SI injection Consent signed and scanned into record. Medication:  4 cc Solumedrol  1 cc Lidocaine 1% without epi Preparation: area cleansed with alcohol Time Out taken  Injection  Landmarks identified Above medication injected via ultrasound guidance into the SI joint space. Patient tolerated well without bleeding or paresthesias  Patient had improved range of motion of joint after injection     Assessment & Plan:  See Problem List

## 2015-01-05 NOTE — Addendum Note (Signed)
Addended by: Tacy Dura RUMPLE, APRIL D on: 01/05/2015 10:30 AM   Modules accepted: Orders

## 2015-01-05 NOTE — Assessment & Plan Note (Signed)
Patient was continued SI joint pain. I advised him that I cannot refill his pain medication as he sign a pain contract and his narcotics will be coming from his primary.  After discussion of therapeutic options, SI injection was performed today with some mild improvement in his pain. Patient to follow closely with his primary provider.

## 2015-01-10 NOTE — Progress Notes (Signed)
I was preceptor for this office visit.  

## 2015-01-11 ENCOUNTER — Encounter: Payer: Self-pay | Admitting: Family Medicine

## 2015-01-11 ENCOUNTER — Ambulatory Visit (INDEPENDENT_AMBULATORY_CARE_PROVIDER_SITE_OTHER): Payer: Medicaid Other | Admitting: Family Medicine

## 2015-01-11 VITALS — BP 128/77 | HR 93 | Temp 97.5°F | Ht 70.0 in | Wt 172.8 lb

## 2015-01-11 DIAGNOSIS — R1011 Right upper quadrant pain: Principal | ICD-10-CM

## 2015-01-11 DIAGNOSIS — R1010 Upper abdominal pain, unspecified: Secondary | ICD-10-CM

## 2015-01-11 DIAGNOSIS — M461 Sacroiliitis, not elsewhere classified: Secondary | ICD-10-CM | POA: Diagnosis not present

## 2015-01-11 DIAGNOSIS — G8929 Other chronic pain: Secondary | ICD-10-CM

## 2015-01-11 MED ORDER — OXYCODONE-ACETAMINOPHEN 5-325 MG PO TABS: 1.0000 | ORAL_TABLET | Freq: Three times a day (TID) | ORAL | Status: DC | PRN

## 2015-01-11 MED ORDER — MELOXICAM 15 MG PO TABS: 15.0000 mg | ORAL_TABLET | Freq: Every day | ORAL | Status: DC

## 2015-01-11 NOTE — Patient Instructions (Signed)
It was a pleasure seeing you today in our clinic. Today we discussed SI joint pain. Here is the treatment plan we have discussed and agreed upon together:   - I have written you a prescription for Meloxicam. Take this once a day, with food. This medication is similar in effect to the Naproxen you had been on but some individuals respond much better than they do naproxen. - Try your best to stretch your back. My recommendations would be to do so in the comfort of your bed rather than standing up or on a hard floor. Allow your body to relax as you pull/stretch these muscles as we discussed in the clinic today. - I have partially refilled your prescription for Oxycodone. As we discussed, this is not an appropriate treatment for joint or muscle pain. But since you were unable to get an appointment with me at the beginning of the month I have tried to cover you until then. This medication is for the chronic abdominal pain you have, not for the joint/muscle pain you've been experiencing these past 3 weeks.

## 2015-01-11 NOTE — Progress Notes (Signed)
   HPI CC: Lt SI joint pain  Pain persists. Has been poorly controlled over the past 3 weeks. Localized to the Lt SI joint. Denies radiation, weakness, paresthesias, or incontinence. Patient received a steroid injection of the SI joint last week. Had some relief w/ this, but pain is now back to where it was. Patient has been taking naproxen and oxycodone for this pain. Naproxen has not worked well. We discussed that the oxycodone is not for this type of pain, and likely will only mask the pain enough to irritate the site more. Patient stated understanding of this.  Review of Systems   See HPI for ROS. All other systems reviewed and are negative.  Past medical history and social history reviewed and updated in the EMR as appropriate.  Objective: BP 128/77 mmHg  Pulse 93  Temp(Src) 97.5 F (36.4 C) (Oral)  Ht 5\' 10"  (1.778 m)  Wt 172 lb 12.8 oz (78.382 kg)  BMI 24.79 kg/m2 Gen: NAD, alert, cooperative, and pleasant. In obvious discomfort. CV: RRR, no murmur Resp: CTAB, mild wheezing noted Abd: Soft, generalized tenderness (at his baseline), extensive surgical scarring present, no guarding or organomegaly Ext: No edema, warm, negative straight leg raise, TTP at Lt SI joint, decreased ROM of hips bilaterally 2/2 pain. Strength 5/5. Neuro: Alert and oriented, Speech clear, No gross deficits  Assessment and plan:  SI (sacroiliac) joint inflammation Continues to have significant TTP over Lt SI joint. Less muscular irritability than on previous exam. Red flag symptoms negative. Will continue to treat for joint irritation; if pain continues to persist than may need further imaging.  - Meloxicam 15mg  QD; to be taken w/ food. To be discontinued w/ any onset of abdominal pain/discomfort. - Daily stretching exercises. Provided examples in office today. To be done ~3x a day.  Next: if pain persists w/o improvement or worsens>> consider further imaging. (CT vs MRI)  Abdominal pain, chronic,  diffuse Patient's f/u appointment made to be scheduled 2 weeks after our normally scheduled period. B/c of this I have made a partial medication refill to cover him for this extra time. - A large part of our visit was spent discussing the appropriate times to take oxycodone. It is not for his SI joint pain. It is for his chronic abdominal pain.  - We discussed the need to be better about his home PT exercises. It is my believe that poor core strength/deconditioning likely played a large role in his current, as well as his chronic, pain.    No orders of the defined types were placed in this encounter.    Meds ordered this encounter  Medications  . oxyCODONE-acetaminophen (ROXICET) 5-325 MG per tablet    Sig: Take 1 tablet by mouth every 8 (eight) hours as needed for severe pain.    Dispense:  13 tablet    Refill:  0  . meloxicam (MOBIC) 15 MG tablet    Sig: Take 1 tablet (15 mg total) by mouth daily.    Dispense:  30 tablet    Refill:  2     Elberta Leatherwood, MD,MS,  PGY1 01/11/2015 3:38 PM

## 2015-01-11 NOTE — Assessment & Plan Note (Signed)
Continues to have significant TTP over Lt SI joint. Less muscular irritability than on previous exam. Red flag symptoms negative. Will continue to treat for joint irritation; if pain continues to persist than may need further imaging.  - Meloxicam 15mg  QD; to be taken w/ food. To be discontinued w/ any onset of abdominal pain/discomfort. - Daily stretching exercises. Provided examples in office today. To be done ~3x a day.  Next: if pain persists w/o improvement or worsens>> consider further imaging. (CT vs MRI)

## 2015-01-11 NOTE — Assessment & Plan Note (Signed)
Patient's f/u appointment made to be scheduled 2 weeks after our normally scheduled period. B/c of this I have made a partial medication refill to cover him for this extra time. - A large part of our visit was spent discussing the appropriate times to take oxycodone. It is not for his SI joint pain. It is for his chronic abdominal pain.  - We discussed the need to be better about his home PT exercises. It is my believe that poor core strength/deconditioning likely played a large role in his current, as well as his chronic, pain.

## 2015-01-30 ENCOUNTER — Ambulatory Visit: Payer: Medicaid Other | Admitting: Psychology

## 2015-01-31 ENCOUNTER — Other Ambulatory Visit: Payer: Self-pay | Admitting: Family Medicine

## 2015-02-02 ENCOUNTER — Encounter: Payer: Self-pay | Admitting: Family Medicine

## 2015-02-02 ENCOUNTER — Ambulatory Visit (INDEPENDENT_AMBULATORY_CARE_PROVIDER_SITE_OTHER): Payer: Medicaid Other | Admitting: Family Medicine

## 2015-02-02 VITALS — BP 137/90 | HR 96 | Temp 98.6°F | Ht 70.0 in | Wt 165.0 lb

## 2015-02-02 DIAGNOSIS — G8929 Other chronic pain: Secondary | ICD-10-CM

## 2015-02-02 DIAGNOSIS — F3170 Bipolar disorder, currently in remission, most recent episode unspecified: Secondary | ICD-10-CM

## 2015-02-02 DIAGNOSIS — R1013 Epigastric pain: Secondary | ICD-10-CM | POA: Diagnosis not present

## 2015-02-02 DIAGNOSIS — R1010 Upper abdominal pain, unspecified: Secondary | ICD-10-CM

## 2015-02-02 DIAGNOSIS — R1011 Right upper quadrant pain: Principal | ICD-10-CM

## 2015-02-02 MED ORDER — OXYCODONE-ACETAMINOPHEN 5-325 MG PO TABS: 1.0000 | ORAL_TABLET | Freq: Three times a day (TID) | ORAL | Status: DC | PRN

## 2015-02-02 MED ORDER — CYCLOBENZAPRINE HCL 10 MG PO TABS: 10.0000 mg | ORAL_TABLET | Freq: Three times a day (TID) | ORAL | Status: DC | PRN

## 2015-02-02 NOTE — Patient Instructions (Signed)
It was a pleasure seeing you today in our clinic. Today we discussed your abdominal pain. Here is the treatment plan we have discussed and agreed upon together:   - I have refilled your meds. - I recommend you contact Dr. Gwenlyn Saran for another appt. I will forward my note to her.

## 2015-02-02 NOTE — Assessment & Plan Note (Signed)
Patient states he's having a hard time eating and sleeping due to his depressed mood. Been going on for a couple weeks. He recently lost his dog. He has been thinking about his father a lot (who passed ~67yrs ago). He would like to talk to someone about this. - patient stated he'd like to see Dr. Gwenlyn Saran again during her mood clinic. I informed her that she schedules her appts so she must call her. - No change in medications at this time. - Will forward note to Dr. Gwenlyn Saran.

## 2015-02-02 NOTE — Progress Notes (Signed)
   HPI CC: Medication Refill   Patient has been having some issues w/ sleeping and eating recently. Admits to feelings of depression. Denies SI/HI. Patient states he has had some stressors at home (financial/family). Also, his dog passed recently due to a parvo infection.   Pain has been under good control over the past couple weeks. Pain has been mostly at his baseline, it is localized to his abdomen, no radiation, and reports some muscle cramping occasionally. Pain medications continue to make daily pain more tolerable. Patient was interested in returning to his previous total amount of oxycodone tablets he receives this month (which is less than he received previously).  ROS: no fever, chills, n/v/d, melena, SOB, hallucinations. Positive for anxiety, depression, irritability, fatigue.  Review of Systems   See HPI for ROS. All other systems reviewed and are negative.  Past medical history and social history reviewed and updated in the EMR as appropriate.  Objective: BP 137/90 mmHg  Pulse 96  Temp(Src) 98.6 F (37 C) (Oral)  Ht 5\' 10"  (1.778 m)  Wt 165 lb (74.844 kg)  BMI 23.68 kg/m2 Gen: NAD, alert, cooperative HEENT: NCAT, EOMI, PERRL CV: RRR, no murmur Resp: CTAB, mild wheezing noted w/ deep inspiration. Abd: Soft, generalized tenderness (at his baseline), extensive surgical scarring present, BS present, no guarding or organomegaly Ext: No edema, warm Neuro: Alert and oriented, Speech clear, No gross deficits  Assessment and plan:  Abdominal pain, chronic, diffuse Patient's pain is getting back under control. His SI joint pain is significantly improved. Patient was asked if he continued to need more tablets than the 18 we had dropped to a couple months ago. He states he was feeling good enough to go back to the 18tabs this month. Continues to have some intermittent abdominal and back spasms. - Oxycodone 5/325 x18tabs - Refilled flexeril script.   Bipolar disorder Patient  states he's having a hard time eating and sleeping due to his depressed mood. Been going on for a couple weeks. He recently lost his dog. He has been thinking about his father a lot (who passed ~20yrs ago). He would like to talk to someone about this. - patient stated he'd like to see Dr. Gwenlyn Saran again during her mood clinic. I informed her that she schedules her appts so she must call her. - No change in medications at this time. - Will forward note to Dr. Gwenlyn Saran.   Meds ordered this encounter  Medications  . oxyCODONE-acetaminophen (ROXICET) 5-325 MG per tablet    Sig: Take 1 tablet by mouth every 8 (eight) hours as needed for severe pain.    Dispense:  18 tablet    Refill:  0  . cyclobenzaprine (FLEXERIL) 10 MG tablet    Sig: Take 1 tablet (10 mg total) by mouth 3 (three) times daily as needed for muscle spasms.    Dispense:  30 tablet    Refill:  1     Elberta Leatherwood, MD,MS,  PGY2 02/02/2015 5:57 PM

## 2015-02-02 NOTE — Assessment & Plan Note (Signed)
Patient's pain is getting back under control. His SI joint pain is significantly improved. Patient was asked if he continued to need more tablets than the 18 we had dropped to a couple months ago. He states he was feeling good enough to go back to the 18tabs this month. Continues to have some intermittent abdominal and back spasms. - Oxycodone 5/325 x18tabs - Refilled flexeril script.

## 2015-02-22 ENCOUNTER — Encounter: Payer: Self-pay | Admitting: Family Medicine

## 2015-02-22 ENCOUNTER — Ambulatory Visit (INDEPENDENT_AMBULATORY_CARE_PROVIDER_SITE_OTHER): Payer: Medicaid Other | Admitting: Family Medicine

## 2015-02-22 VITALS — BP 124/74 | HR 88 | Temp 97.6°F | Ht 70.0 in | Wt 166.0 lb

## 2015-02-22 DIAGNOSIS — R1010 Upper abdominal pain, unspecified: Secondary | ICD-10-CM

## 2015-02-22 DIAGNOSIS — G8929 Other chronic pain: Secondary | ICD-10-CM

## 2015-02-22 DIAGNOSIS — R1011 Right upper quadrant pain: Principal | ICD-10-CM

## 2015-02-22 MED ORDER — OXYCODONE-ACETAMINOPHEN 5-325 MG PO TABS
1.0000 | ORAL_TABLET | Freq: Three times a day (TID) | ORAL | Status: DC | PRN
Start: 2015-02-22 — End: 2015-03-28

## 2015-02-22 NOTE — Patient Instructions (Signed)
It was a pleasure seeing you today in our clinic. Today we discussed your chronic pain. Here is the treatment plan we have discussed and agreed upon together:   - Keep up the good work. Stay active. Stay positive. - I refilled your pain meds. We are not going down in tablets this time but lets try for a reduction next month!

## 2015-02-24 NOTE — Progress Notes (Signed)
   HPI CC: Medication Refill   Patient has been doing well. Pain has been under relatively good control over the past month. Still localized to his abdomen (LLQ), no radiation, and no acute changes. Pain medications continue to make daily pain more tolerable. Patient was not interested in decreasing the total amount of oxycodone tablets he receives this month -- but says he wants to try for next month.  ROS: no HA, dizziness, vision changes, falls, SOB, CP, n/v/d/c, tremors, fever, or chills. Patient endorses some recent depression (was his late father's bday last wk) All other systems reviewed and are negative.  Past medical history and social history reviewed and updated in the EMR as appropriate.  Objective: BP 124/74 mmHg  Pulse 88  Temp(Src) 97.6 F (36.4 C) (Oral)  Ht 5\' 10"  (1.778 m)  Wt 166 lb (75.297 kg)  BMI 23.82 kg/m2 Gen: NAD, alert, cooperative, and pleasant. HEENT: NCAT, EOMI, PERRL CV: RRR, no murmur Resp: CTAB, mild crackle noted w/ deep inspiration. Abd: Soft, generalized tenderness--worse at LLQ (at his baseline), extensive surgical scarring present, BS present, no guarding or organomegaly  Ext: No edema, warm Neuro: Alert and oriented, Speech clear, No gross deficits  Assessment and plan:  Abdominal pain, chronic, diffuse Pain continues at patient's baseline. Some exacerbation w/ recent lawn mowing -- this type of activity was encouraged. Pain medication refilled today. Patient to f/u in 6mth.   Meds ordered this encounter  Medications  . oxyCODONE-acetaminophen (ROXICET) 5-325 MG per tablet    Sig: Take 1 tablet by mouth every 8 (eight) hours as needed for severe pain.    Dispense:  18 tablet    Refill:  0     Elberta Leatherwood, MD,MS,  PGY2 02/24/2015 3:33 PM

## 2015-02-24 NOTE — Assessment & Plan Note (Signed)
Pain continues at patient's baseline. Some exacerbation w/ recent lawn mowing -- this type of activity was encouraged. Pain medication refilled today. Patient to f/u in 83mth.

## 2015-03-15 ENCOUNTER — Ambulatory Visit (INDEPENDENT_AMBULATORY_CARE_PROVIDER_SITE_OTHER): Payer: Medicaid Other | Admitting: Family Medicine

## 2015-03-15 ENCOUNTER — Encounter: Payer: Self-pay | Admitting: Family Medicine

## 2015-03-15 VITALS — BP 129/81 | HR 84 | Temp 97.8°F | Wt 164.0 lb

## 2015-03-15 DIAGNOSIS — R609 Edema, unspecified: Secondary | ICD-10-CM

## 2015-03-15 DIAGNOSIS — R600 Localized edema: Secondary | ICD-10-CM

## 2015-03-15 MED ORDER — LORATADINE 10 MG PO TABS: 10.0000 mg | ORAL_TABLET | Freq: Every day | ORAL | Status: DC

## 2015-03-15 MED ORDER — OLOPATADINE HCL 0.2 % OP SOLN: 1.0000 [drp] | Freq: Every day | OPHTHALMIC | Status: DC

## 2015-03-15 NOTE — Progress Notes (Signed)
Patient ID: Timothy [name], male   DOB: [birth year], 49 y.o.   MRN: [id number]    Subjective: CC: Eye irritation/swelling HPI: Patient is a 49 y.o. male with a past medical history of cough to lisinopril presenting to clinic today for a SDA for eye irritation/swelling.  R eye irritation began on Sunday- it felt "itchy."   Monday the R eye became painful to touch (but no pain with extraocular movement) Tuesday when he woke up with his R eye lid was swollen and continued to be painful to touch. He also noted whitish-green crusting of the R eye lid. He took a shower and washed the crusting away. Throughout the day, the swelling went down without any intervention. There is still minimal tenderness to touch in the R eye but it's significantly improved. He did note pruritus of the L eye that began on Tuesday and appears stable today.  No pain with extraocular movements, conjunctival injection, fevers, or vision change. No history of this occuring prior. No coryza, sneezing, SOB. Cough is at his baseline (heavy smoker/COPD)  Social History: smokes 2ppd   ROS: All other systems reviewed and are negative.  Past Medical History Patient Active Problem List   Diagnosis Date Noted  . Periorbital edema 03/16/2015  . SI (sacroiliac) joint inflammation 12/30/2014  . Encounter for chronic pain management 06/24/2014  . Erectile dysfunction 03/14/2014  . Grief 08/05/2013  . Abdominal pain, chronic, diffuse 04/15/2013  . Akathisia 03/02/2013  . Ventral hernia 01/11/2013  . Migraine headache 08/23/2012  . Essential hypertension, benign 06/28/2012  . Bipolar disorder 06/02/2012  . Insomnia 04/27/2012  . Panic anxiety syndrome 03/21/2012  . History of colostomy 03/21/2012  . History of alcohol abuse 10/29/2011  . History of diverticulitis of colon 10/16/2011  . Tobacco abuse disorder 10/16/2011    Medications- reviewed and updated Current Outpatient Prescriptions  Medication Sig Dispense Refill  .  albuterol (PROVENTIL HFA;VENTOLIN HFA) 108 (90 BASE) MCG/ACT inhaler Inhale 2 puffs into the lungs every 6 (six) hours as needed for wheezing or shortness of breath. 1 Inhaler 2  . azithromycin (ZITHROMAX) 250 MG tablet Take 2 tablets today. Take one tablet once a day until completion (with food). 6 tablet 0  . busPIRone (BUSPAR) 15 MG tablet Take 1 tablet (15 mg total) by mouth 4 (four) times daily. (Patient taking differently: Take 15 mg by mouth 2 (two) times daily. ) 120 tablet 5  . busPIRone (BUSPAR) 15 MG tablet Take 15 mg by mouth 2 (two) times daily. #60 one twice a day with 11 refills.  [name] is experimenting with decreasing to 1 a day to see if this decreases his irritability.    . cyclobenzaprine (FLEXERIL) 10 MG tablet Take 1 tablet (10 mg total) by mouth 3 (three) times daily as needed for muscle spasms. 30 tablet 1  . diazepam (VALIUM) 5 MG tablet Take 1 tablet (5 mg total) by mouth 2 (two) times daily as needed for anxiety. 60 tablet 2  . diazepam (VALIUM) 5 MG tablet Take 5 mg by mouth 2 (two) times daily. Dr. Tammi Klippel in [facility] now writing this prescription.  #60 with 5 refills dated 11/20/14 so is due for refills on 06/22/15.    Marland Kitchen loratadine (CLARITIN) 10 MG tablet Take 1 tablet (10 mg total) by mouth daily. 30 tablet 11  . losartan (COZAAR) 100 MG tablet Take 1 tablet (100 mg total) by mouth daily. 90 tablet 3  . lurasidone (LATUDA) 40 MG TABS tablet Take 40 mg  by mouth daily. Per Dr. Tammi Klippel in [facility].  #30 with 11 refills.    . meloxicam (MOBIC) 15 MG tablet Take 1 tablet (15 mg total) by mouth daily. 30 tablet 2  . naproxen (NAPROSYN) 500 MG tablet   0  . Olopatadine HCl 0.2 % SOLN Apply 1 drop to eye daily. 2.5 mL 0  . oxyCODONE-acetaminophen (ROXICET) 5-325 MG per tablet Take 1 tablet by mouth every 8 (eight) hours as needed for severe pain. 18 tablet 0  . predniSONE (DELTASONE) 50 MG tablet Take on tablet once a day until completion. 5 tablet 0  . Tiotropium Bromide Monohydrate (SPIRIVA  RESPIMAT) 2.5 MCG/ACT AERS Inhale 2 puffs into the lungs daily. 1 Inhaler 2   No current facility-administered medications for this visit.    Objective: Office vital signs reviewed. BP 129/81 mmHg  Pulse 84  Temp(Src) 97.8 F (36.6 C) (Oral)  Wt 164 lb (74.39 kg)   Physical Examination:  General: Awake, alert, well- nourished, NAD ENMT:  TMs intact, normal light reflex, no erythema, no bulging. Nasal turbinates moist. MMM, Oropharynx clear without erythema or tonsillar exudate/hypertrophy Eyes: Mild swelling of the right eyelid with minimal erythema. No nodules or more prominent regions of swelling. Conjunctiva non-injected bilaterally. PERRL.  Cardio: RRR, no m/r/g noted.  Pulm: No increased WOB.  Inspiratory and expiratory wheeze note bilaterally.    Assessment/Plan: Periorbital edema History, exam and photograph provided by patient consistent with periorbital allergies. Minimal erythema, no conjunctival injection, and no systemic symptoms concerning for infection. No vision changes (blurred vision, halos, or photophobia). No history of trauma. Exam not consistent with a chalazion or hordeolum. - Pataday eye drops - Claritin daily for now  - RTC precautions discussed: swelling, redness, increased pain, change in vision, or fevers - Patient has a h/o cough with lisinopril however no h/o angioedema. If swelling recurs, consider discontinuation for losartan.    Patient evaluated and management discussed with attending, Dr. Gwendlyn Deutscher  No orders of the defined types were placed in this encounter.    Meds ordered this encounter  Medications  . Olopatadine HCl 0.2 % SOLN    Sig: Apply 1 drop to eye daily.    Dispense:  2.5 mL    Refill:  0  . loratadine (CLARITIN) 10 MG tablet    Sig: Take 1 tablet (10 mg total) by mouth daily.    Dispense:  30 tablet    Refill:  [place] PGY-2, [place]

## 2015-03-15 NOTE — Patient Instructions (Signed)
You most likely had a reaction to some sort of allergen (periorbital allergy) Nothing on your exam today is concerning for anything dangerous.  I have prescribed you both an eye drop and something to take by mouth for allergies If you notice more swelling, redness, increased pain, change in vision, or fevers please come back and see [place].

## 2015-03-16 ENCOUNTER — Encounter: Payer: Self-pay | Admitting: Family Medicine

## 2015-03-16 DIAGNOSIS — R600 Localized edema: Secondary | ICD-10-CM | POA: Insufficient documentation

## 2015-03-16 NOTE — Assessment & Plan Note (Addendum)
History, exam and photograph provided by patient consistent with periorbital allergies. Minimal erythema, no conjunctival injection, and no systemic symptoms concerning for infection. No vision changes (blurred vision, halos, or photophobia). No history of trauma. Exam not consistent with a chalazion or hordeolum. - Pataday eye drops - Claritin daily for now  - RTC precautions discussed: swelling, redness, increased pain, change in vision, or fevers - Patient has a h/o cough with lisinopril however no h/o angioedema. If swelling recurs, consider discontinuation for losartan.

## 2015-03-28 ENCOUNTER — Encounter: Payer: Self-pay | Admitting: Family Medicine

## 2015-03-28 ENCOUNTER — Ambulatory Visit (INDEPENDENT_AMBULATORY_CARE_PROVIDER_SITE_OTHER): Payer: Medicaid Other | Admitting: Family Medicine

## 2015-03-28 VITALS — BP 141/84 | HR 83 | Temp 97.9°F | Ht 70.0 in | Wt 166.2 lb

## 2015-03-28 DIAGNOSIS — G8929 Other chronic pain: Secondary | ICD-10-CM

## 2015-03-28 DIAGNOSIS — R1011 Right upper quadrant pain: Principal | ICD-10-CM

## 2015-03-28 DIAGNOSIS — R1010 Upper abdominal pain, unspecified: Secondary | ICD-10-CM | POA: Diagnosis present

## 2015-03-28 MED ORDER — OXYCODONE-ACETAMINOPHEN 5-325 MG PO TABS: 1.0000 | ORAL_TABLET | Freq: Three times a day (TID) | ORAL | Status: DC | PRN

## 2015-03-28 NOTE — Assessment & Plan Note (Signed)
Patient's pain is at his baseline today. Pain is well controlled w/ current med regimen. Patient IS interested in reducing Narcotic amount today (to 17#). I will not reduce today due to the fact that he ran out of pain meds 3 days ago. My goal for reducing quantity is to either have 1 tablet available by refill date or to have run out <24hr prior.

## 2015-03-28 NOTE — Progress Notes (Signed)
   HPI CC: Medication Refill   Patient has been doing well. Pain has been under control over the past month. Pain has been mostly at his baseline, it is localized to his abdomen, no radiation, and no acute changes. Pain medications continue to make daily pain more tolerable. Patient was interested in decreasing the total amount of oxycodone tablets he receives this month. At home physical therapy is still not the highest priority >> will work towards this.  Review of Systems   See HPI for ROS. All other systems reviewed and are negative.  Past medical history and social history reviewed and updated in the EMR as appropriate.  Objective: BP 141/84 mmHg  Pulse 83  Temp(Src) 97.9 F (36.6 C) (Oral)  Ht 5\' 10"  (1.778 m)  Wt 166 lb 3.2 oz (75.388 kg)  BMI 23.85 kg/m2 Gen: NAD, alert, cooperative, and pleasant. HEENT: NCAT, EOMI, PERRL CV: RRR, no murmur Resp: CTAB, mild wheezing noted w/ deep inspiration.  Abd: Soft, generalized tenderness (at his baseline), extensive surgical scarring present, BS present, no guarding or organomegaly.  Ext: No edema, warm Neuro: Alert and oriented, Speech clear, No gross deficits  Assessment and plan:  Abdominal pain, chronic, diffuse Patient's pain is at his baseline today. Pain is well controlled w/ current med regimen. Patient IS interested in reducing Narcotic amount today (to 17#). I will not reduce today due to the fact that he ran out of pain meds 3 days ago. My goal for reducing quantity is to either have 1 tablet available by refill date or to have run out <24hr prior.    Meds ordered this encounter  Medications  . oxyCODONE-acetaminophen (ROXICET) 5-325 MG per tablet    Sig: Take 1 tablet by mouth every 8 (eight) hours as needed for severe pain.    Dispense:  18 tablet    Refill:  0     Elberta Leatherwood, MD,MS,  PGY2 03/28/2015 2:05 PM

## 2015-03-28 NOTE — Patient Instructions (Signed)
It was a pleasure seeing you today in our clinic. Today we discussed your pain. Here is the treatment plan we have discussed and agreed upon together:   - Continue the same treatment regimen as before. - Do your physical therapy exercises!

## 2015-04-06 ENCOUNTER — Telehealth: Payer: Self-pay | Admitting: Psychology

## 2015-04-06 NOTE — Telephone Encounter (Signed)
Timothy [name] left a VM stating he tried to get his Valium filled but it had been greater than 180 days and they wouldn't fill it.  I called the pharmacy ([facility] on [address]) and spoke with the pharmacist.  He stated that Timothy [name] had filled his Valium script four times and one refill left but wasn't able to get it because the script was too old.  I authorized there refill and we will see Timothy [name] in clinic on 9/21.  He has been using his Valium as prescribed per the pharmacist's report and is due for a refill a month from now (10/15).

## 2015-04-12 ENCOUNTER — Ambulatory Visit (INDEPENDENT_AMBULATORY_CARE_PROVIDER_SITE_OTHER): Payer: Medicaid Other | Admitting: Psychology

## 2015-04-12 DIAGNOSIS — F3170 Bipolar disorder, currently in remission, most recent episode unspecified: Secondary | ICD-10-CM | POA: Diagnosis not present

## 2015-04-12 NOTE — Patient Instructions (Signed)
Please schedule a follow-up for:  January 4th at 9:00.   We gave you refills today.  Let me know if you have any questions or concerns. Good luck with Timothy [name].

## 2015-04-12 NOTE — Assessment & Plan Note (Signed)
Mood is euthymic today.  Irritability remains an issue.  This is part temperament and part mood disorder.  Caffeine intake and smoking likely continue to contribute as well.  He is not eager to do anything about this and treatment team thinks keeping medicines the same for now makes good sense.  Appears to appreciate the support from [facility].  Reports a good relationship with his PCP.  Dr. Tammi Klippel did suggest cutting his morning Buspar in half and taking 7.5 mg to see if this might decrease his irritability further.    He elected to follow in about three months.  Thanksgiving and early December will be tough times as it is the anniversary of his father's death.  He is aware of the Integrated [facility] program here and scheduled an appointment on a Monday but unfortunately, I am on vacation that day.

## 2015-04-12 NOTE — Progress Notes (Signed)
Timothy [name] presents for follow-up.  He was last seen in [facility] in 09/2014.  He reports he is doing well overall with irritability continuing to be the biggest issue.  He reports 3-4 days of irritable mood in the last two weeks.  He typically awakens with this mood and copes by keeping to himself.  People know to stay away from him although he reports he doesn't mind his girlfriend being near.  He states the irritability doesn't really bother him unless he "goes off" on someone he cares about (e.g. His son).  Then he feels guilty on the flip side.  He does not feel like he can control his outburst if people push him when he is in this mood.  Substance use:  2 cups of coffee daily with 3-4 Diet Mountain Dews.  Denies drinking alcohol regularly.  Smokes 2 ppd.  New puppy named Tempie Donning.  Has to attend to him regularly which is keeping him active.  At first questioning, he was unable to name anything that brings him pleasure or satisfaction.  When pushed a bit, he was able to name his dog, girlfriend, and he is excited about a trip to the beach this week for fishing.

## 2015-04-21 ENCOUNTER — Telehealth: Payer: Self-pay | Admitting: Family Medicine

## 2015-04-21 ENCOUNTER — Other Ambulatory Visit: Payer: Self-pay | Admitting: Family Medicine

## 2015-04-21 DIAGNOSIS — R1011 Right upper quadrant pain: Principal | ICD-10-CM

## 2015-04-21 DIAGNOSIS — G8929 Other chronic pain: Secondary | ICD-10-CM

## 2015-04-21 MED ORDER — OXYCODONE-ACETAMINOPHEN 5-325 MG PO TABS: 1.0000 | ORAL_TABLET | Freq: Three times a day (TID) | ORAL | Status: DC | PRN

## 2015-04-21 NOTE — Telephone Encounter (Signed)
Prescription written and left up front.

## 2015-04-21 NOTE — Telephone Encounter (Signed)
Pt was called today because of Dr. Alease Frame not being in clinic on 10/3. The patient was rescheduled to next available 10/20. The issue is he needs a refill on his Oxycodone left up front for pick up. He was going to be getting this at 10/3 appointment. Please call patient when ready. jw

## 2015-04-24 ENCOUNTER — Ambulatory Visit: Payer: Medicaid Other | Admitting: Family Medicine

## 2015-04-24 NOTE — Telephone Encounter (Signed)
Rx has been picked up

## 2015-05-11 ENCOUNTER — Ambulatory Visit: Payer: Medicaid Other | Admitting: Family Medicine

## 2015-05-18 ENCOUNTER — Ambulatory Visit: Payer: Medicaid Other | Admitting: Family Medicine

## 2015-05-19 ENCOUNTER — Other Ambulatory Visit: Payer: Self-pay | Admitting: Family Medicine

## 2015-05-19 DIAGNOSIS — G8929 Other chronic pain: Secondary | ICD-10-CM

## 2015-05-19 DIAGNOSIS — R1011 Right upper quadrant pain: Principal | ICD-10-CM

## 2015-05-19 NOTE — Telephone Encounter (Signed)
Pt is checking on the status of his refill request of pain medication. jw

## 2015-05-19 NOTE — Telephone Encounter (Signed)
Pt called and needs a refill on his pain medication left up front. Please call patient when ready to pick up. jw

## 2015-05-22 ENCOUNTER — Other Ambulatory Visit: Payer: Self-pay | Admitting: Family Medicine

## 2015-05-22 MED ORDER — OXYCODONE-ACETAMINOPHEN 5-325 MG PO TABS: 1.0000 | ORAL_TABLET | Freq: Three times a day (TID) | ORAL | Status: DC | PRN

## 2015-05-22 NOTE — Telephone Encounter (Signed)
Pt is calling again to check the status of his refill request jw

## 2015-05-22 NOTE — Telephone Encounter (Signed)
Pt calling once more about this request. Thank you, Fonda Kinder, ASA

## 2015-05-22 NOTE — Telephone Encounter (Signed)
Called patient. Med refill waiting upfront.

## 2015-06-20 ENCOUNTER — Ambulatory Visit (INDEPENDENT_AMBULATORY_CARE_PROVIDER_SITE_OTHER): Payer: Medicaid Other | Admitting: Family Medicine

## 2015-06-20 ENCOUNTER — Encounter: Payer: Self-pay | Admitting: Family Medicine

## 2015-06-20 VITALS — BP 156/88 | HR 96 | Temp 98.2°F | Wt 168.0 lb

## 2015-06-20 DIAGNOSIS — R1010 Upper abdominal pain, unspecified: Secondary | ICD-10-CM | POA: Diagnosis not present

## 2015-06-20 DIAGNOSIS — R1013 Epigastric pain: Secondary | ICD-10-CM

## 2015-06-20 DIAGNOSIS — G8929 Other chronic pain: Secondary | ICD-10-CM

## 2015-06-20 DIAGNOSIS — R1011 Right upper quadrant pain: Secondary | ICD-10-CM

## 2015-06-20 MED ORDER — OMEPRAZOLE 20 MG PO CPDR: 20.0000 mg | DELAYED_RELEASE_CAPSULE | Freq: Every day | ORAL | Status: DC

## 2015-06-20 MED ORDER — CYCLOBENZAPRINE HCL 10 MG PO TABS: 10.0000 mg | ORAL_TABLET | Freq: Three times a day (TID) | ORAL | Status: DC | PRN

## 2015-06-20 MED ORDER — OXYCODONE-ACETAMINOPHEN 5-325 MG PO TABS: 1.0000 | ORAL_TABLET | Freq: Three times a day (TID) | ORAL | Status: DC | PRN

## 2015-06-20 NOTE — Patient Instructions (Signed)
It was a pleasure seeing you today in our clinic. Today we discussed your nausea and pain. Here is the treatment plan we have discussed and agreed upon together:   - refilled meds. - prescribed omeprazole. - restrict coffee, pop, and alcohol

## 2015-06-20 NOTE — Assessment & Plan Note (Signed)
Patient endorses some new epigastric pain with 2 recent episodes of vomiting secondary to this pain. He relays some generalized tenderness at the upper quadrants. Pain is not made worse or better with food. However today he did endorse using Goody powders approximately 3 times a day. It is my fear that with his previous colectomy he is at an increased risk for ulcerations. Patient also has a social history significant for smoking, drinking large amounts of coffee/soda, and a previous history of alcohol abuse. - I informed patient that he needs to discontinue the use of his Goody powders immediately. (And all other NSAID containing substances) - Decrease or refrain from drinking coffee/soda/alcohol - Omeprazole 20 mg once a day 4 months >> will reassess in 2 months

## 2015-06-20 NOTE — Assessment & Plan Note (Signed)
Pain appears to be relatively well-controlled. No changes in his medication regimen.

## 2015-06-20 NOTE — Progress Notes (Signed)
   HPI CC: Medication Refill   Patient has been doing well. Pain has been under control over the past month. Pain has been mostly at his baseline, it is localized to his abdomen, no radiation, and no acute changes. Pain medications continue to make daily pain more tolerable. Patient was not interested in decreasing the total amount of oxycodone tablets he receives this month.   Endorses 2 episodes of vomiting due to some acute onset pain--this resolved shortly afterward. Denies increased pain w/ food or relief from eating food. Endorses Goodie-powder use >> 3x a day.  ROS: Denies fever, chills, headache, dizziness, shortness of breath, chest pain, hemoptysis, hematemesis/coffee-ground emesis, diarrhea, hematochezia, melena.  Past medical history and social history reviewed and updated in the EMR as appropriate.  Objective: BP 156/88 mmHg  Pulse 96  Temp(Src) 98.2 F (36.8 C) (Oral)  Wt 168 lb (76.204 kg) Gen: NAD, alert, cooperative, and pleasant. CV: RRR, no murmur Resp: CTAB, mild wheezing noted w/ deep inspiration.  Abd: Soft, generalized tenderness that is worse at the LLQ (at his baseline), some new generalized tenderness at the LUQ/RUQ/epigastrium,  extensive surgical scarring present, BS present, no guarding or organomegaly. Ext: No edema, warm Neuro: Alert and oriented, Speech clear, No gross deficits  Assessment and plan:  Abdominal pain, chronic, diffuse Pain appears to be relatively well-controlled. No changes in his medication regimen.  Abdominal pain, epigastric Patient endorses some new epigastric pain with 2 recent episodes of vomiting secondary to this pain. He relays some generalized tenderness at the upper quadrants. Pain is not made worse or better with food. However today he did endorse using Goody powders approximately 3 times a day. It is my fear that with his previous colectomy he is at an increased risk for ulcerations. Patient also has a social history  significant for smoking, drinking large amounts of coffee/soda, and a previous history of alcohol abuse. - I informed patient that he needs to discontinue the use of his Goody powders immediately. (And all other NSAID containing substances) - Decrease or refrain from drinking coffee/soda/alcohol - Omeprazole 20 mg once a day 4 months >> will reassess in 2 months   Meds ordered this encounter  Medications  . cyclobenzaprine (FLEXERIL) 10 MG tablet    Sig: Take 1 tablet (10 mg total) by mouth 3 (three) times daily as needed for muscle spasms.    Dispense:  30 tablet    Refill:  1  . oxyCODONE-acetaminophen (ROXICET) 5-325 MG tablet    Sig: Take 1 tablet by mouth every 8 (eight) hours as needed for severe pain.    Dispense:  18 tablet    Refill:  0  . omeprazole (PRILOSEC) 20 MG capsule    Sig: Take 1 capsule (20 mg total) by mouth daily.    Dispense:  30 capsule    Refill:  3     Elberta Leatherwood, MD,MS,  PGY2 06/20/2015 6:29 PM

## 2015-07-12 ENCOUNTER — Encounter: Payer: Self-pay | Admitting: Family Medicine

## 2015-07-12 ENCOUNTER — Ambulatory Visit (INDEPENDENT_AMBULATORY_CARE_PROVIDER_SITE_OTHER): Payer: Medicaid Other | Admitting: Family Medicine

## 2015-07-12 VITALS — BP 108/75 | HR 109 | Temp 97.7°F | Wt 169.0 lb

## 2015-07-12 DIAGNOSIS — G8929 Other chronic pain: Secondary | ICD-10-CM

## 2015-07-12 DIAGNOSIS — R1010 Upper abdominal pain, unspecified: Secondary | ICD-10-CM

## 2015-07-12 DIAGNOSIS — J441 Chronic obstructive pulmonary disease with (acute) exacerbation: Secondary | ICD-10-CM | POA: Diagnosis not present

## 2015-07-12 DIAGNOSIS — R1011 Right upper quadrant pain: Principal | ICD-10-CM

## 2015-07-12 MED ORDER — PREDNISONE 50 MG PO TABS: 50.0000 mg | ORAL_TABLET | Freq: Every day | ORAL | Status: DC

## 2015-07-12 MED ORDER — LEVOFLOXACIN 500 MG PO TABS
500.0000 mg | ORAL_TABLET | Freq: Every day | ORAL | Status: DC
Start: 2015-07-12 — End: 2015-08-03

## 2015-07-12 MED ORDER — OXYCODONE-ACETAMINOPHEN 5-325 MG PO TABS: 1.0000 | ORAL_TABLET | Freq: Three times a day (TID) | ORAL | Status: DC | PRN

## 2015-07-12 NOTE — Patient Instructions (Signed)
Cough Treatment - you should: - Take the antibiotic, Levaquin, daily with food. - Take the steroid, prednisone, daily with food. - Ibuprofen for fevers - Rest. And drink plenty of water. You should be better in: About 7 days Call us if you have severe shortness of breath, high fever or are not better in 2 weeks

## 2015-07-12 NOTE — Progress Notes (Signed)
COUGH  Has been coughing for 4 days. Cough is: productive Sputum production: yes, brown Medications tried: pseudoephedrine, tylenol, ibuprofen Taking blood pressure medications: yes  Symptoms Runny nose: yes Mucous in back of throat: yes Throat burning or reflux: yes Wheezing or asthma: yes Fever: no Chest Pain: yes Shortness of breath: yes Leg swelling: no Hemoptysis: no Weight loss: no  ROS see HPI Smoking Status noted  Objective: BP 108/75 mmHg  Pulse 109  Temp(Src) 97.7 F (36.5 C) (Oral)  Wt 169 lb (76.658 kg)  SpO2 95% Gen: NAD, alert, cooperative, appears uncomfortable HEENT: MMM, OP erythematous, cobblestoning, TMs normal, nasal mucosa edematous CV: RRR, no murmur Resp: Left-sided wheezing noted throughout. Right-sided with an occasional wheeze. No increased WOB Ext: No edema, warm Neuro: Alert and oriented, Speech clear, No gross deficits  Assessment and plan:  COPD exacerbation ([place]) Patient is here today with complaints of malaise, cough, and increased sputum production. Signs and symptoms most consistent with COPD exacerbation. Likely of viral cause. No fevers or persistent shortness of breath. Patient states that he has had increased abdominal pain due to coughing spells. - Levaquin 500 mg daily 7 days. - Prednisone 50 mg daily 5 days. - I have encouraged increasing fluid intake with water and/or Gatorade. - Ibuprofen for fevers. - Norco 5/325 mg 7 (patient's increased abdominal pain is causing him to take his normal medications more frequently in usual. Because of this I'm providing an additional week's worth of patient's typical pain medication. Patient stated his understanding that this is a one time effort to help keep him more comfortable. I did offer patient cough syrup with codeine however patient was unsure if his insurance to cover this; whether or not this is accurate in additional week's worth of Norco will likely keep patient comfortable during  this uncomfortable time.)   Meds ordered this encounter  Medications  . levofloxacin (LEVAQUIN) 500 MG tablet    Sig: Take 1 tablet (500 mg total) by mouth daily.    Dispense:  7 tablet    Refill:  0  . predniSONE (DELTASONE) 50 MG tablet    Sig: Take 1 tablet (50 mg total) by mouth daily with breakfast.    Dispense:  5 tablet    Refill:  0  . oxyCODONE-acetaminophen (ROXICET) 5-325 MG tablet    Sig: Take 1 tablet by mouth every 8 (eight) hours as needed for severe pain.    Dispense:  7 tablet    Refill:  0     Elberta Leatherwood, MD,MS,  PGY2 07/12/2015 12:28 PM

## 2015-07-12 NOTE — Assessment & Plan Note (Signed)
Patient is here today with complaints of malaise, cough, and increased sputum production. Signs and symptoms most consistent with COPD exacerbation. Likely of viral cause. No fevers or persistent shortness of breath. Patient states that he has had increased abdominal pain due to coughing spells. - Levaquin 500 mg daily 7 days. - Prednisone 50 mg daily 5 days. - I have encouraged increasing fluid intake with water and/or Gatorade. - Ibuprofen for fevers. - Norco 5/325 mg 7 (patient's increased abdominal pain is causing him to take his normal medications more frequently in usual. Because of this I'm providing an additional week's worth of patient's typical pain medication. Patient stated his understanding that this is a one time effort to help keep him more comfortable. I did offer patient cough syrup with codeine however patient was unsure if his insurance to cover this; whether or not this is accurate in additional week's worth of Norco will likely keep patient comfortable during this uncomfortable time.)

## 2015-07-18 ENCOUNTER — Encounter: Payer: Self-pay | Admitting: Family Medicine

## 2015-07-18 ENCOUNTER — Ambulatory Visit (INDEPENDENT_AMBULATORY_CARE_PROVIDER_SITE_OTHER): Payer: Medicaid Other | Admitting: Family Medicine

## 2015-07-18 VITALS — BP 137/86 | HR 99 | Temp 98.1°F | Ht 70.0 in | Wt 172.1 lb

## 2015-07-18 DIAGNOSIS — R1010 Upper abdominal pain, unspecified: Secondary | ICD-10-CM | POA: Diagnosis present

## 2015-07-18 DIAGNOSIS — G8929 Other chronic pain: Secondary | ICD-10-CM

## 2015-07-18 DIAGNOSIS — R1011 Right upper quadrant pain: Principal | ICD-10-CM

## 2015-07-18 DIAGNOSIS — J441 Chronic obstructive pulmonary disease with (acute) exacerbation: Secondary | ICD-10-CM | POA: Diagnosis not present

## 2015-07-18 MED ORDER — BENZONATATE 200 MG PO CAPS: 200.0000 mg | ORAL_CAPSULE | Freq: Two times a day (BID) | ORAL | Status: DC | PRN

## 2015-07-18 MED ORDER — OXYCODONE-ACETAMINOPHEN 5-325 MG PO TABS: 1.0000 | ORAL_TABLET | Freq: Three times a day (TID) | ORAL | Status: DC | PRN

## 2015-07-18 NOTE — Assessment & Plan Note (Signed)
Patient's pain has been slightly worse with the new onset cough. Patient states that his pain has been relatively well-controlled since his course of antibiotics and prednisone. Continues to take the oxycodone/acetaminophen as needed for this pain with good control. Today patient asked to reduce his quantity from 18 tablets to 17. Although I applied his motivation I've refilled his prescription for 18 tablets as I do not want to get behind on his pain with his persistent productive cough. Patient stated his understanding and we will plan to reduce his tablet quantity to 17 next month, assuming no setbacks. - Refilled pain medications.

## 2015-07-18 NOTE — Progress Notes (Signed)
   HPI CC: Medication Refill and cough f/u  Patient has been doing well. Pain has been under control over the past month despite his cough. Pain is localized to his abdomen, no radiation, and no acute changes. Pain medications continue to make daily pain more tolerable. Patient was interested in decreasing the total amount of oxycodone tablets he receives this month, however I am concerned that he will likely need more due to this persistant cough. He denies any fevers but endorses some occasional night sweats.  ROS: Denies headache, weight loss, weight gain, chest pain, shortness of breath, nausea, diarrhea, or peripheral edema. Endorses occasional nighttime sweats, previous fever, cough, sputum production.  Past medical history and social history reviewed and updated in the EMR as appropriate.  Objective: BP 137/86 mmHg  Pulse 99  Temp(Src) 98.1 F (36.7 C) (Oral)  Ht 5\' 10"  (1.778 m)  Wt 172 lb 1.6 oz (78.064 kg)  BMI 24.69 kg/m2 Gen: NAD, alert, cooperative, and pleasant. HEENT: NCAT, EOMI, PERRL, nasal mucosa edematous, OP erythema without exudate, TMs clear. CV: RRR, no murmur Resp: CTAB, mild wheezing noted w/ deep inspiration. (Much improved from previous examination) Abd: Soft, generalized tenderness (at his baseline), extensive surgical scarring present Ext: No edema, warm Neuro: Alert and oriented, Speech clear, No gross deficits  Assessment and plan:  COPD exacerbation ([facility]) Significant improvement from previous examination. Patient states that he took his final dose of antibiotics this morning. Completed his prednisone 2 days ago. He states that he feels better however is not yet back to his baseline. Cough persists. He experienced a episode of nighttime sweats last night however feels significantly improved since that time. - Prescribed Tessalon Perles for cough when necessary. - Advised to take ibuprofen/Aleve for fevers or discomfort as needed.  Abdominal pain,  chronic, diffuse Patient's pain has been slightly worse with the new onset cough. Patient states that his pain has been relatively well-controlled since his course of antibiotics and prednisone. Continues to take the oxycodone/acetaminophen as needed for this pain with good control. Today patient asked to reduce his quantity from 18 tablets to 17. Although I applied his motivation I've refilled his prescription for 18 tablets as I do not want to get behind on his pain with his persistent productive cough. Patient stated his understanding and we will plan to reduce his tablet quantity to 17 next month, assuming no setbacks. - Refilled pain medications.    Meds ordered this encounter  Medications  . oxyCODONE-acetaminophen (ROXICET) 5-325 MG tablet    Sig: Take 1 tablet by mouth every 8 (eight) hours as needed for severe pain.    Dispense:  18 tablet    Refill:  0  . benzonatate (TESSALON) 200 MG capsule    Sig: Take 1 capsule (200 mg total) by mouth 2 (two) times daily as needed for cough.    Dispense:  20 capsule    Refill:  0     Elberta Leatherwood, MD,MS,  PGY2 07/18/2015 10:48 AM

## 2015-07-18 NOTE — Patient Instructions (Signed)
It was a pleasure seeing you today in our clinic. Today we discussed your abdominal pain and cough. Here is the treatment plan we have discussed and agreed upon together:   - I'm glad you are starting to feel better. Continue to take ibuprofen or aleve for fevers and swelling - Take the Tessalon Perles up to twice a day as needed for coughing. - I'd like to see you back in 1 month

## 2015-07-18 NOTE — Assessment & Plan Note (Signed)
Significant improvement from previous examination. Patient states that he took his final dose of antibiotics this morning. Completed his prednisone 2 days ago. He states that he feels better however is not yet back to his baseline. Cough persists. He experienced a episode of nighttime sweats last night however feels significantly improved since that time. - Prescribed Tessalon Perles for cough when necessary. - Advised to take ibuprofen/Aleve for fevers or discomfort as needed.

## 2015-07-26 ENCOUNTER — Ambulatory Visit (INDEPENDENT_AMBULATORY_CARE_PROVIDER_SITE_OTHER): Payer: Medicaid Other | Admitting: Psychology

## 2015-07-26 DIAGNOSIS — F3170 Bipolar disorder, currently in remission, most recent episode unspecified: Secondary | ICD-10-CM | POA: Diagnosis not present

## 2015-07-26 NOTE — Progress Notes (Signed)
Timothy [name] presents for follow-up.  He states he is doing well overall (less irritable per his report) and thinks his medicine is working well.  His main complaint is his relationship with his step-mom (in [place]).  She know longer calls (missed his birthday and didn't call for Christmas) and this is hurtful.  He has decided to not call her anymore.  Still has a girlfriend and things with the dog Timothy [name]) are going well.    When asked, Timothy [name] reports about 5 days of depressed mood over the last two weeks.  Ties this mood to when he was thinking about the situation with his step-mom.  Discussed strategies to "get out of" this type of thinking.  He reports staying busy with the dog or calling his girlfriend.    He had a disability hearing in early December and should hear within 60 days.  His attorney thinks he has a good chance.  Discussed how he would use this money wisely.

## 2015-07-26 NOTE — Assessment & Plan Note (Addendum)
Report of mood initially is good.  Upon further questioning, reports some situational depressed mood and more notable, 2-3 days in the last two weeks where he lacked motivation (did not leave the house and stayed in bed most of the day).  No interference in his function with the exception of getting his dishes and laundry done.  Denies difficulty in relationships.  His assessment is that he is going to have some "down days" and does not think he would benefit from a change in medication and the treatment team agrees.    Discussed cognitive and behavioral options including reaching out to his step mom (he has considered reaching out to his step-sister) and some behavioral activation strategies.  Will see back in about 6 months or sooner as needed.  See patient instructions for further plan.     Refilled Buspar and Diazepam.

## 2015-07-26 NOTE — Patient Instructions (Signed)
We will see you back July 19th at 9:00.  You can always try to schedule an appointment with Dr. Alease Frame on a Monday or Thursday morning and we can meet for a brief period of time.   Consider talking to your step-sister and making some headway with your step mom. Say hi to [place] and good luck with the disability.

## 2015-08-01 ENCOUNTER — Telehealth: Payer: Self-pay | Admitting: Psychology

## 2015-08-02 ENCOUNTER — Telehealth: Payer: Self-pay | Admitting: Family Medicine

## 2015-08-02 ENCOUNTER — Other Ambulatory Visit: Payer: Self-pay | Admitting: *Deleted

## 2015-08-02 ENCOUNTER — Telehealth: Payer: Self-pay | Admitting: Psychology

## 2015-08-02 MED ORDER — LOSARTAN POTASSIUM 100 MG PO TABS: 100.0000 mg | ORAL_TABLET | Freq: Every day | ORAL | Status: DC

## 2015-08-02 NOTE — Telephone Encounter (Signed)
Refill placed

## 2015-08-02 NOTE — Telephone Encounter (Signed)
Timothy [name] called to say he lost his prescriptions that were written for him last [place].  They were for Buspar and Diazepam.  He has Diazepam that will last him through 09/2015.  I told him we are unable to replace lost or stolen scripts for controlled substances.  I will call in the [place].

## 2015-08-02 NOTE — Telephone Encounter (Signed)
Timothy [name] found the scripts he had lost for the Timothy [name] and Timothy [name].

## 2015-08-02 NOTE — Telephone Encounter (Signed)
Pt called and needs a refill on his Losartan called in. jw °

## 2015-08-03 ENCOUNTER — Encounter: Payer: Self-pay | Admitting: Family Medicine

## 2015-08-03 ENCOUNTER — Ambulatory Visit (INDEPENDENT_AMBULATORY_CARE_PROVIDER_SITE_OTHER): Payer: Medicaid Other | Admitting: Family Medicine

## 2015-08-03 VITALS — BP 129/79 | HR 102 | Temp 98.3°F | Wt 169.0 lb

## 2015-08-03 DIAGNOSIS — H6093 Unspecified otitis externa, bilateral: Secondary | ICD-10-CM | POA: Diagnosis not present

## 2015-08-03 DIAGNOSIS — J441 Chronic obstructive pulmonary disease with (acute) exacerbation: Secondary | ICD-10-CM

## 2015-08-03 MED ORDER — DOXYCYCLINE HYCLATE 100 MG PO TABS: 100.0000 mg | ORAL_TABLET | Freq: Two times a day (BID) | ORAL | Status: DC

## 2015-08-03 MED ORDER — PREDNISONE 50 MG PO TABS: 50.0000 mg | ORAL_TABLET | Freq: Every day | ORAL | Status: DC

## 2015-08-03 NOTE — Patient Instructions (Addendum)
Take prednisone and doxycycline Sent these to your pharmacy  Long term the only way to stop this from recurring will be stopping smoking Let us know if you want help with quitting  Come back early next week to be reassessed If worsening, or you develop chest pain, fevers, etc please go to ER this weekend  Be well, Dr. Ardelia Mems

## 2015-08-04 NOTE — Progress Notes (Signed)
Date of Visit: 08/03/2015   HPI:  Patient presents for a same day appointment to discuss cough and shortness of breath.  Seen here at [facility] on 12/21 and dx'd with COPD exacerbation, treated with levaquin and prednisone. That got better entirely. Yesterday he began feeling a little short of breath with some chest tightness from congestion. Has coughed and had brown sputum. Denies any chest pain, just feels congested. No nausea or vomiting. Not worse with exertion. No fevers or lower extremity edema. Has taken albuterol which helped some. This feels exactly the same as it did with his prior COPD exacerbation. Patient has smoked for 30 years, smokes 2 packs per day. Is not interested in smoking cessation.   Also patient notes that his ears have been itching recently. No drainage or significant pain.  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 TMs clear bilaterally but canals appear irritated in each ear. Nares patent. Oropharynx clear and moist. No anterior cervical lymphadenopathy Heart: regular rate and rhythm no murmur Lungs: mild expiratory wheezes throughout. No crackles. normal work of breathing. Speaks in full sentences without distress. Neuro: alert, grossly nonfocal, speech normal  ASSESSMENT/PLAN:  1. COPD exacerbation: symptoms (sob, chest tightness, cough, sputum production) consistent with prior episodes of COPD exacerbation. Endorsing chest tightness with cough and congestion but NO chest pain. Low suspicion for cardiac etiology of chest tightness. Patient is afebrile and well  appearing, without hypoxia. Safe for outpatient management.  - prednisone 50mg  daily for 5 days - doxycycline 100mg  twice daily for 7 days (switching to doxy since received levaquin last time) - continue albuterol  - discussed ED precuations including worsening symptoms, chest pain, fevers, etc - follow up here in clinic early next week for repeat assessment - frank discussion with patient today that the only thing that will reliably prevent recurrent exacerbations is if he quits smoking. He is not interested in quitting at this time.  2. Otitis externa - irritated appearing, itchy canals bilaterally - rx ciprodex drops  FOLLOW UP: Follow up early next week for COPD  [place] J. Ardelia Mems, [place]

## 2015-08-05 MED ORDER — CIPROFLOXACIN-DEXAMETHASONE 0.3-0.1 % OT SUSP: 4.0000 [drp] | Freq: Two times a day (BID) | OTIC | Status: DC

## 2015-08-14 ENCOUNTER — Encounter: Payer: Self-pay | Admitting: Family Medicine

## 2015-08-14 ENCOUNTER — Ambulatory Visit (INDEPENDENT_AMBULATORY_CARE_PROVIDER_SITE_OTHER): Payer: Medicaid Other | Admitting: Family Medicine

## 2015-08-14 VITALS — BP 120/80 | HR 88 | Temp 97.8°F | Ht 70.0 in | Wt 173.0 lb

## 2015-08-14 DIAGNOSIS — J441 Chronic obstructive pulmonary disease with (acute) exacerbation: Secondary | ICD-10-CM | POA: Diagnosis not present

## 2015-08-14 DIAGNOSIS — R1010 Upper abdominal pain, unspecified: Secondary | ICD-10-CM

## 2015-08-14 DIAGNOSIS — R1011 Right upper quadrant pain: Principal | ICD-10-CM

## 2015-08-14 DIAGNOSIS — R5383 Other fatigue: Secondary | ICD-10-CM

## 2015-08-14 DIAGNOSIS — Z114 Encounter for screening for human immunodeficiency virus [HIV]: Secondary | ICD-10-CM | POA: Diagnosis not present

## 2015-08-14 DIAGNOSIS — Z720 Tobacco use: Secondary | ICD-10-CM

## 2015-08-14 DIAGNOSIS — G8929 Other chronic pain: Secondary | ICD-10-CM

## 2015-08-14 DIAGNOSIS — T732XXA Exhaustion due to exposure, initial encounter: Secondary | ICD-10-CM

## 2015-08-14 LAB — BASIC METABOLIC PANEL WITH GFR
BUN: 14 mg/dL (ref 7–25)
CHLORIDE: 105 mmol/L (ref 98–110)
CO2: 26 mmol/L (ref 20–31)
CREATININE: 0.85 mg/dL (ref 0.60–1.35)
Calcium: 9.5 mg/dL (ref 8.6–10.3)
GFR, Est Non African American: >89 mL/min (ref >=60)
Glucose, Bld: 94 mg/dL (ref 65–99)
POTASSIUM: 4.5 mmol/L (ref 3.5–5.3)
SODIUM: 140 mmol/L (ref 135–146)

## 2015-08-14 LAB — LIPID PANEL
CHOL/HDL RATIO: 4.4 ratio (ref <=5.0)
Cholesterol: 155 mg/dL (ref 125–200)
HDL: 35 mg/dL — ABNORMAL LOW (ref >=40)
LDL CALC: 106 mg/dL (ref <130)
Triglycerides: 71 mg/dL (ref <150)
VLDL: 14 mg/dL (ref <30)

## 2015-08-14 LAB — CBC
HCT: 41.8 % (ref 39.0–52.0)
Hemoglobin: 13.7 g/dL (ref 13.0–17.0)
MCH: 29.5 pg (ref 26.0–34.0)
MCHC: 32.8 g/dL (ref 30.0–36.0)
MCV: 89.9 fL (ref 78.0–100.0)
MPV: 9.5 fL (ref 8.6–12.4)
PLATELETS: 415 10*3/uL — AB (ref 150–400)
RBC: 4.65 MIL/uL (ref 4.22–5.81)
RDW: 14.6 % (ref 11.5–15.5)
WBC: 9.8 10*3/uL (ref 4.0–10.5)

## 2015-08-14 LAB — HIV ANTIBODY (ROUTINE TESTING W REFLEX): HIV: NONREACTIVE

## 2015-08-14 MED ORDER — OXYCODONE-ACETAMINOPHEN 5-325 MG PO TABS: 1.0000 | ORAL_TABLET | Freq: Three times a day (TID) | ORAL | Status: DC | PRN

## 2015-08-14 MED ORDER — LOSARTAN POTASSIUM 100 MG PO TABS: 100.0000 mg | ORAL_TABLET | Freq: Every day | ORAL | Status: DC

## 2015-08-14 MED ORDER — PREDNISONE 50 MG PO TABS: 50.0000 mg | ORAL_TABLET | Freq: Every day | ORAL | Status: DC

## 2015-08-14 NOTE — Patient Instructions (Signed)
It was a pleasure seeing you today in our clinic. Today we discussed your breathing and smoking. Here is the treatment plan we have discussed and agreed upon together:   - I have sent in an additional 5 days of prednisone. - As we discussed I would like for you to restart taking your Spiriva inhaler, 2 puffs once a day. - I would like to have you begin mentally preparing for the possibility of quitting smoking. - I will call you with any significant findings from the labs that we are able to obtain today or any medication changes/additions that these labs may direct [place] towards.

## 2015-08-14 NOTE — Assessment & Plan Note (Signed)
Stable: Patient's abdominal pain is relatively unchanged on exam. Patient states that he has had some slightly increased baseline pain secondary to his persistent cough. He denies any need for increased pain medication at this time, however he does not believe he would be able to tolerate any reductions at this time either. - Refilled oxycodone - No changes.

## 2015-08-14 NOTE — Assessment & Plan Note (Signed)
Worse/stable: Patient is here for persistent cough over the past 1 month. He is artery been on Levaquin and doxycycline with associated prednisone burst. He states that these medication regimens have helped however as soon as he is off of these medications his symptoms return. He is endorsing some new onset fatigue otherwise no acute symptomatic changes. - I have prescribed an additional 5 days of prednisone. Patient has been afebrile and previously treated with 2 different appropriate antibiotic courses so I do not feel a third would be beneficial. - I have asked that he set up an appointment with Dr. Valentina Lucks in 1-2 weeks to be reevaluated for his COPD with possible PFTs and further discussion about medical management. - I've asked that he restart taking his Spiriva. We discussed the benefit of this medication and why it is so important that he begin taking this again. - We had a very long discussion about the importance of smoking cessation. I understand that it will not be easy due to the fact that he is currently smoking 2 packs a day however I believe his 10-15 year prognosis is extremely high risk and we discussed this as well.

## 2015-08-14 NOTE — Assessment & Plan Note (Signed)
Unchanged: Patient currently smoking 2 packs a day without any current motivation to reduce or quit. We discussed his long-term risks as well as his current ailments which are likely linked to his tobacco abuse. - I have asked that he make an appointment with Dr. Valentina Lucks in 1-2 weeks to have a PFT tests as well as go over COPD medications. - More importantly I have forewarned him that I will be asking Dr. Valentina Lucks to discuss possible smoking cessation techniques and I would like for him to get mentally prepared for [place] to encourage his quitting. - lipid panel obtained due to patient's CAD risk.

## 2015-08-14 NOTE — Assessment & Plan Note (Signed)
New: Patient complaining of 1-2 weeks of new onset fatigue. Some likely association with persistent cough. Unknown etiology at this time. - CBC to assess for anemia. - BMP to assess electrolyte balance. - Will monitor

## 2015-08-14 NOTE — Progress Notes (Signed)
HPI CC: Medication Refill and persistent cough. Patient is complaining of persistent cough which is been ongoing over the past month. He is been treated with both Levaquin and doxycycline. Cough is improved slightly but persists today. Patient endorses persistent smoking up to 2 packs a day. This is his typical baseline. We discussed the benefits of smoking cessation however patient is very reluctant to do so. Patient has not been taking the Spiriva as he feels it is not providing any benefit.  Patient also endorses some new onset fatigue. He states that he has been out of energy and lacking motivation for the past couple weeks. This is different from his baseline and he feels that is likely associated with his persistent cough.  Patient's pain has been doing well. Pain has been under decent control over the past month but has been complicated by his recent ailments. Pain has been mostly at his baseline, it is localized to his abdomen, no radiation, and no acute changes. Pain medications continue to make daily pain more tolerable. Patient was not interested in decreasing the total amount of oxycodone tablets he receives this month due to his persistent cough and concern about the pain it is causing.   ROS: Patient denies any fevers, chills, headache, blurred vision, chest pain, shortness of breath, nausea, vomiting, diarrhea, constipation, dysuria, weakness, numbness, or paresthesias.  Past medical history and social history reviewed and updated in the EMR as appropriate.  Objective: BP 120/80 mmHg  Pulse 88  Temp(Src) 97.8 F (36.6 C) (Oral)  Ht 5\' 10"  (1.778 m)  Wt 173 lb (78.472 kg)  BMI 24.82 kg/m2  SpO2 98% Gen: NAD, alert, cooperative, and pleasant. CV: RRR, no murmur Resp: moderate wheezing noted w/ deep inspiration in all lung fields. Worse from baseline Abd: Soft, generalized tenderness (at his baseline), extensive surgical scarring present, BS present, no guarding or  organomegaly  Ext: No edema, warm  Assessment and plan:  COPD exacerbation (HCC) Worse/stable: Patient is here for persistent cough over the past 1 month. He is artery been on Levaquin and doxycycline with associated prednisone burst. He states that these medication regimens have helped however as soon as he is off of these medications his symptoms return. He is endorsing some new onset fatigue otherwise no acute symptomatic changes. - I have prescribed an additional 5 days of prednisone. Patient has been afebrile and previously treated with 2 different appropriate antibiotic courses so I do not feel a third would be beneficial. - I have asked that he set up an appointment with Dr. Valentina Lucks in 1-2 weeks to be reevaluated for his COPD with possible PFTs and further discussion about medical management. - I've asked that he restart taking his Spiriva. We discussed the benefit of this medication and why it is so important that he begin taking this again. - We had a very long discussion about the importance of smoking cessation. I understand that it will not be easy due to the fact that he is currently smoking 2 packs a day however I believe his 10-15 year prognosis is extremely high risk and we discussed this as well.  Abdominal pain, chronic, diffuse Stable: Patient's abdominal pain is relatively unchanged on exam. Patient states that he has had some slightly increased baseline pain secondary to his persistent cough. He denies any need for increased pain medication at this time, however he does not believe he would be able to tolerate any reductions at this time either. - Refilled oxycodone -  No changes.  Tobacco abuse disorder Unchanged: Patient currently smoking 2 packs a day without any current motivation to reduce or quit. We discussed his long-term risks as well as his current ailments which are likely linked to his tobacco abuse. - I have asked that he make an appointment with Dr. Valentina Lucks in 1-2  weeks to have a PFT tests as well as go over COPD medications. - More importantly I have forewarned him that I will be asking Dr. Valentina Lucks to discuss possible smoking cessation techniques and I would like for him to get mentally prepared for [place] to encourage his quitting. - lipid panel obtained due to patient's CAD risk.  Fatigue New: Patient complaining of 1-2 weeks of new onset fatigue. Some likely association with persistent cough. Unknown etiology at this time. - CBC to assess for anemia. - BMP to assess electrolyte balance. - Will monitor    Orders Placed This Encounter  Procedures  . Lipid panel  . CBC  . BASIC METABOLIC PANEL WITH GFR  . HIV antibody    Meds ordered this encounter  Medications  . DISCONTD: oxyCODONE-acetaminophen (ROXICET) 5-325 MG tablet    Sig: Take 1 tablet by mouth every 8 (eight) hours as needed for severe pain.    Dispense:  18 tablet    Refill:  0  . losartan (COZAAR) 100 MG tablet    Sig: Take 1 tablet (100 mg total) by mouth daily.    Dispense:  90 tablet    Refill:  3  . predniSONE (DELTASONE) 50 MG tablet    Sig: Take 1 tablet (50 mg total) by mouth daily with breakfast.    Dispense:  5 tablet    Refill:  0  . oxyCODONE-acetaminophen (ROXICET) 5-325 MG tablet    Sig: Take 1 tablet by mouth every 8 (eight) hours as needed for severe pain.    Dispense:  18 tablet    Refill:  0    Do Not Fill Prior to 09/14/15     Elberta Leatherwood, MD,MS,  PGY2 08/14/2015 10:57 AM

## 2015-08-31 ENCOUNTER — Ambulatory Visit (INDEPENDENT_AMBULATORY_CARE_PROVIDER_SITE_OTHER): Payer: Medicaid Other | Admitting: Pharmacist

## 2015-08-31 ENCOUNTER — Encounter: Payer: Self-pay | Admitting: Pharmacist

## 2015-08-31 ENCOUNTER — Other Ambulatory Visit: Payer: Self-pay | Admitting: Family Medicine

## 2015-08-31 VITALS — BP 135/90 | HR 107 | Wt 168.0 lb

## 2015-08-31 DIAGNOSIS — R0600 Dyspnea, unspecified: Secondary | ICD-10-CM | POA: Diagnosis present

## 2015-08-31 DIAGNOSIS — Z720 Tobacco use: Secondary | ICD-10-CM | POA: Diagnosis not present

## 2015-08-31 MED ORDER — NICOTINE 10 MG IN INHA: 1.0000 | RESPIRATORY_TRACT | Status: DC | PRN

## 2015-08-31 NOTE — Assessment & Plan Note (Signed)
Patient with severe Nicotine Dependence of 34 years duration in a patient who is poor candidate for success b/c of hesitation to quit.  Provided extensive education on tobacco cessation and patient agreed to work to reduce nicotine intake.  Initiated nicotine replacement tx with nicotine inhaler to be used in combination with smoking initially in an attempt to cut down to < 30 cigarettes per day. Patient counseled on purpose, proper use, and potential adverse effects.

## 2015-08-31 NOTE — Progress Notes (Addendum)
S:    Patient arrives in good spirits, ambulating without assistance.    Presents for lung function evaluation and evaluation/assistance with tobacco dependence.   Patient was referred on 08/14/15.  Patient was last seen by Primary Care Provider on 08/14/15.   Patient reports he is short of breath all the time.  Patient reports two exacerbations in the past two months requiring prednisone.    Patient reports last dose of COPD medications was several days ago.  Reports taking a dose of albuterol 3 to 4 days ago and reports he has not used the Spiriva in 6 to 8 months.    Age when started using tobacco on a daily basis 53. Number of Cigarettes per day 40. Brand smoked Marlboro red. Estimated Nicotine Content per Cigarette (mg) > 1 mg/cigarette.  Estimated Nicotine intake per day > 40 mg.    Most recent quit attempt October 2014 Longest time ever been tobacco free 4 months during surgery.  Medications (NRT, bupropion, varenicline) used in prior in past cessation efforts include: Chantix - reports it did not work, nicotine gum and lozenges.   Rates IMPORTANCE of quitting tobacco on 1-10 scale of 7. Rates CONFIDENCE of quitting tobacco on 1-10 scale of 2.  Rates CONFIDENCE of cutting back on tobacco use on 1-10 scale of 3.  Most common triggers to use tobacco include - boredom   Motivation to quit: girlfriend is motivating him to quit back; dad passed away at age 58yo due to lung cancer   O: mMRC score= 2 CAT score= 23 See Documentation Flowsheet - CAT/COPD for complete symptom scoring.  See "scanned report" or Documentation Flowsheet (discrete results - PFTs) for  Spirometry results. Patient provided good effort while attempting spirometry.   Lung Age = 50yo Albuterol Neb  Lot# [id number]     Exp. [id number]  A/P: Spirometry evaluation with Pre and Post Bronchodilator reveals near normal lung function however his FEV1/FVC ration of 72 or 73 % is concerning.  Patient has been experiencing  shortness of breath and additional symptoms for several months and taking albuterol PRN.  Patient reports he has not used Spiriva in 6 to 8 months because he did not feel it was working.  No change to treatment plan at this time.  Will prioritize tobacco cessation and continue albuterol PRN.  Could consider trial of inhaled corticosteroid at future visit.  Educated patient on purpose, proper use, potential adverse effects of albuterol inhaler.  Reviewed results of pulmonary function tests.  Pt verbalized understanding of results and education.    Patient with severe Nicotine Dependence of 34 years duration in a patient who is poor candidate for success b/c of hesitation to quit.  Provided extensive education on tobacco cessation and patient agreed to work to reduce nicotine intake.  Initiated nicotine replacement tx with nicotine inhaler. Patient counseled on purpose, proper use, and potential adverse effects.  Called Pharmacy and followed up with Prior Authorization for Nicotrol Oral Inhaler via Medicaid (Approval number: [id number]; Approval dates: 08/31/15 to 12/29/15).  Written pt instructions provided.  F/U Clinic visit with Dr. Alease Frame.  I am happy to follow up with him if he is interested in additional support.  Total time in face to face counseling 45 minutes.  Patient seen with Phillis Knack, PharmD Candidate, Stephens November, PharmD Resident, and Elisabeth Most, PharmD Resident.

## 2015-08-31 NOTE — Patient Instructions (Addendum)
Thank you for coming in today!    Your lung function test shows near normal lung function.   Continue to use albuterol as needed for shortness of breath.   Start trying to cut down on the number of cigarettes you smoke per day.  Start trying to replace every other cigarette with the nicotine inhaler.  I have sent a prescription for nicotine inhaler to your pharmacy.    Follow up with Dr. Alease Frame.

## 2015-08-31 NOTE — Assessment & Plan Note (Signed)
Spirometry evaluation with Pre and Post Bronchodilator reveals near normal lung function however his FEV1/FVC ration of 72 or 73 % is concerning.  Patient has been experiencing shortness of breath and additional symptoms for several months and taking albuterol PRN.  Patient reports he has not used Spiriva in 6 to 8 months because he did not feel it was working.  No change to treatment plan at this time.  Will prioritize tobacco cessation and continue albuterol PRN.  Could consider trial of inhaled corticosteroid at future visit.  Educated patient on purpose, proper use, potential adverse effects of albuterol inhaler.  Reviewed results of pulmonary function tests.  Pt verbalized understanding of results and education.

## 2015-08-31 NOTE — Progress Notes (Signed)
Patient ID: Timothy [name], male   DOB: [birth year], 49 y.o.   MRN: [id number] Reviewed: Agree with Dr. Graylin Shiver documentation and management.

## 2015-09-02 ENCOUNTER — Telehealth: Payer: Self-pay | Admitting: Family Medicine

## 2015-09-02 NOTE — Telephone Encounter (Signed)
EMERGENCY TELEPHONE LINE Contacted by Mr. Okelley stating he is out of his Losartan and the pharmacy is not giving him his refills. States he has a mild headache, which is not abnormal for him or different from prior. Denies chest pain, shortness of breath, weakness.   Chart review shows Losartan was ordered in 07/2015 with 3 refills. Pharmacy contacted and states that prior authorization is needed prior to refill. Contacted the prior authorization peoples at [phone] and authorization approved until 08/2016 with note that Lisinopril has been tried and resulted in cough. Prior Authorization # [id number]. Pharmacy contacted and prior authorization approved.   Mr. Conner contacted and notified that his medication should be at pharmacy. Recommended checking his blood pressure at the pharmacy when he goes to pick up his medication. No further concerns at this time.  Dr. Gerlean Ren 09/02/15, 11:42 AM

## 2015-09-02 NOTE — Telephone Encounter (Signed)
Feels like BP is up Needs Losartan refilled  Little headache

## 2015-09-04 ENCOUNTER — Telehealth: Payer: Self-pay | Admitting: *Deleted

## 2015-09-04 NOTE — Telephone Encounter (Signed)
Prior Authorization received from [place] for Losartan 100 mg. PA pending per [place] [employer]. Derl Barrow, RN

## 2015-09-04 NOTE — Telephone Encounter (Signed)
Received PA approval for Losartan 100 mg tablets via [place] [employer].  Med approved for 09/04/15 - 09/03/16.  [facility] informed.  PA approval number [id number]. Derl Barrow, RN

## 2015-09-19 ENCOUNTER — Encounter: Payer: Self-pay | Admitting: Family Medicine

## 2015-09-19 ENCOUNTER — Ambulatory Visit (INDEPENDENT_AMBULATORY_CARE_PROVIDER_SITE_OTHER): Payer: Medicaid Other | Admitting: Family Medicine

## 2015-09-19 VITALS — BP 154/82 | HR 98 | Temp 97.8°F | Wt 173.0 lb

## 2015-09-19 DIAGNOSIS — J029 Acute pharyngitis, unspecified: Secondary | ICD-10-CM

## 2015-09-19 DIAGNOSIS — J069 Acute upper respiratory infection, unspecified: Secondary | ICD-10-CM | POA: Diagnosis not present

## 2015-09-19 DIAGNOSIS — B9789 Other viral agents as the cause of diseases classified elsewhere: Principal | ICD-10-CM

## 2015-09-19 LAB — POCT RAPID STREP A (OFFICE): RAPID STREP A SCREEN: NEGATIVE

## 2015-09-19 MED ORDER — IBUPROFEN 400 MG PO TABS: 400.0000 mg | ORAL_TABLET | Freq: Four times a day (QID) | ORAL | Status: DC | PRN

## 2015-09-19 NOTE — Patient Instructions (Signed)
Thank you for coming in to clinic today.  1. It sounds like you have a Upper Respiratory Virus - this will most likely run it's course in 7 to 10 days. - Sent rx Ibuprofen 400mg  take one every 6 hours with food and water for pains, headache, fever - Take Loratadine or Zyrtec OTC daily for 7 to 10 days for allergies to reduce congestion - Drink plenty of fluids to improve congestion - You may try over the counter Nasal Saline spray (Simply Saline, Ocean Spray) as needed to reduce congestion. - Drink warm herbal tea with honey for sore throat - If cough not improving try Mucinex-DM for cough, only take maximum of 7 days - Continue Albuterol and breathing treatments for cough with your COPD  Try to cut back smoking  STREP TEST NEGATIVE  If worsening to develop shortness of breath, chest pain, persistent coughing with wheezing, vomiting unable to take medicine or food/drink, fevers, or new concerns please return sooner or go to ED  Please schedule a follow-up appointment with Dr Alease Frame within 7 to 10 days if not improved Respiratory Infection  If you have any other questions or concerns, please feel free to call the clinic to contact me. You may also schedule an earlier appointment if necessary.  However, if your symptoms get significantly worse, please go to the Emergency Department to seek immediate medical attention.  [name], [place]

## 2015-09-19 NOTE — Progress Notes (Signed)
Subjective:    Patient ID: Timothy [name], male    DOB: [birth year], 50 y.o.   MRN: [id number]  Timothy [name] is a 50 y.o. male presenting on 09/19/2015 for Sore Throat and Ear Pain  Patient presents for a same day appointment.  HPI  Reports symptoms started yesterday with "woke up not feeling well" with sore scratchy throat on Right side, and Right ear pain. Associated with some nasal congestion, sinus pressure, occasional cough with some productive thicker sputum (brown, green color) with wheezing, but not as severe as last COPD exac. - Recently had COPD exac few weeks ago, treated outpatient with Doxcycyline and Prednisone, completed these and resolved. Lazaro Arms PM and Excedrin last night, and last dose Goody powder today 1 hour ago - Sick contact at home with 47 yr son sick over weekend with sore throat and similar symptoms, evaluated at doctors office swab positive for strep, and treated with Amoxicillin. The patient took an old Amoxicillin last night with slight improvement  Social History  Substance Use Topics  . Smoking status: Current Every Day Smoker -- 2.00 packs/day for 34 years    Types: Cigarettes    Start date: 07/22/1981  . Smokeless tobacco: Former Systems developer    Types: Chew     Comment: Current 2 PPD smoker - marlboro RED  . 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 No distress.  Appears older than stated age 6 yr, tired but  well-appearing, comfortable and cooperative  HENT:  Head: Normocephalic and atraumatic.  Sinuses non-tender to palpation. Bilateral TM's clear with normal landmarks and very minimal clear effusion on Right compared to Left. No erythema or bulging. Patent nares without turbinate edema. Oropharynx clear without erythema, exudates, or asymmetry. Moist mucus mem.  Eyes: Conjunctivae are normal. Right eye exhibits no discharge. Left eye exhibits no discharge.  Neck: Normal range of motion. Neck supple. No thyromegaly present.  Cardiovascular: Regular rhythm, normal heart sounds and intact distal pulses.   No murmur heard. Mild tachycardia  Pulmonary/Chest: Effort normal. No respiratory distress. He has wheezes (Prolonged exp wheezes diffusely). He has no rales. He exhibits no tenderness.  Good air movement with some coarse sounds, improve with cough.  Musculoskeletal: He exhibits no edema.  Lymphadenopathy:    He has no cervical adenopathy.  Neurological: He is alert.  Skin: Skin is warm and dry. No rash noted. He is not diaphoretic.  Nursing note and vitals reviewed.      Assessment & Plan:   Problem List Items Addressed This Visit    None    Visit Diagnoses    Viral URI with cough    -  Primary    Relevant Medications    ibuprofen (ADVIL,MOTRIN) 400 MG tablet    Sore throat        Relevant Medications    ibuprofen (ADVIL,MOTRIN) 400 MG tablet    Other Relevant Orders  POCT rapid strep A (Completed)       Consistent with viral URI / pharyngitis x 24 hours, with productive cough in setting of underlying poorly controlled COPD. Afebrile. No CP/SOB. Pulse ox in clinic >98%. Lungs exp wheeze, likely at baseline, no focal crackles. No focal sign of infection, rapid strep negative (despite reported sick contact home with strep). Unlikely influenza without myalgias and fevers.  Plan: 1. Reassurance, likely self-limited with cough lasting up to few weeks 2. Supportive care per AVS 3.  Rx Ibuprofen PRN for sore throat 4. Start daily anti-histamine 5. May try mucinex if not improving 6. Strict return criteria with close follow-up if productive cough or develops SOB worsening, concern for COPD exac, however clinically with symptoms x 24 hours, and recent exac few weeks ago decision to hold on steroids and antibiotics   Meds ordered this encounter  Medications  . ibuprofen (ADVIL,MOTRIN) 400 MG tablet    Sig: Take 1 tablet (400 mg total) by mouth every 6 (six) hours as needed for fever, headache or moderate pain.    Dispense:  30 tablet    Refill:  0      Follow up plan: Return in 1 week (on 09/26/2015).  [name], [place], PGY-3

## 2015-09-25 ENCOUNTER — Ambulatory Visit: Payer: Medicaid Other | Admitting: Family Medicine

## 2015-10-02 ENCOUNTER — Other Ambulatory Visit: Payer: Self-pay | Admitting: Family Medicine

## 2015-10-06 ENCOUNTER — Other Ambulatory Visit: Payer: Self-pay | Admitting: Family Medicine

## 2015-10-10 ENCOUNTER — Ambulatory Visit: Payer: Medicaid Other | Admitting: Family Medicine

## 2015-10-11 ENCOUNTER — Other Ambulatory Visit: Payer: Self-pay | Admitting: Family Medicine

## 2015-10-11 DIAGNOSIS — R1011 Right upper quadrant pain: Principal | ICD-10-CM

## 2015-10-11 DIAGNOSIS — G8929 Other chronic pain: Secondary | ICD-10-CM

## 2015-10-11 NOTE — Telephone Encounter (Signed)
Need refill on the cyclobenzaprine and his oxycodone.  Rescheduled appt missed from yesterday to 4/17 @ 1:30 pm.

## 2015-10-12 ENCOUNTER — Telehealth: Payer: Self-pay | Admitting: Psychology

## 2015-10-12 NOTE — Telephone Encounter (Signed)
Patient called and left a VM.  I called him back and left a VM.

## 2015-10-13 MED ORDER — OXYCODONE-ACETAMINOPHEN 5-325 MG PO TABS: 1.0000 | ORAL_TABLET | Freq: Three times a day (TID) | ORAL | Status: DC | PRN

## 2015-10-13 MED ORDER — CYCLOBENZAPRINE HCL 10 MG PO TABS: 10.0000 mg | ORAL_TABLET | Freq: Three times a day (TID) | ORAL | Status: DC | PRN

## 2015-10-13 NOTE — Telephone Encounter (Signed)
Pt is calling again about his refill on his oxycodone.  Wants to talk to a nurse about this.

## 2015-10-13 NOTE — Telephone Encounter (Signed)
Script left up front.

## 2015-10-13 NOTE — Telephone Encounter (Addendum)
Timothy [name] called to let me know he has been off of his Latuda (40 mg) for about a week.  He ran out and decided to try to go without it.  He thinks his mood is better, he is more tolerant, and he has noticed more energy / desire to do things.  His girlfriend reports noticing a big difference and he has heard comments from other people as well.  He continues to take Buspar 15 mg one in the morning and one in the evening.  Also continues with the Diazepam.  Discussed that this increase in energy may be illness related.  Sounds like good energy now - we want to make sure it doesn't turn in to bad energy.  Also talked about Diazepam without a medicine that is treating his illness (Bipolar Disorder).    Came up with this plan:  Scheduled him at our first available (May 3rd) at 9:00.  He will keep at least one week's worth of irritability and energy tracking and we will discuss at his appointment.  He is to call if anything changes for the worse before then.

## 2015-10-30 ENCOUNTER — Ambulatory Visit (INDEPENDENT_AMBULATORY_CARE_PROVIDER_SITE_OTHER): Payer: Medicaid Other | Admitting: Family Medicine

## 2015-10-30 ENCOUNTER — Encounter: Payer: Self-pay | Admitting: Family Medicine

## 2015-10-30 ENCOUNTER — Other Ambulatory Visit: Payer: Self-pay | Admitting: Family Medicine

## 2015-10-30 VITALS — BP 136/86 | HR 117 | Temp 97.7°F | Ht 70.0 in | Wt 164.7 lb

## 2015-10-30 DIAGNOSIS — R1010 Upper abdominal pain, unspecified: Secondary | ICD-10-CM

## 2015-10-30 DIAGNOSIS — R1013 Epigastric pain: Secondary | ICD-10-CM | POA: Diagnosis not present

## 2015-10-30 DIAGNOSIS — R1011 Right upper quadrant pain: Principal | ICD-10-CM

## 2015-10-30 DIAGNOSIS — G8929 Other chronic pain: Secondary | ICD-10-CM | POA: Diagnosis not present

## 2015-10-30 LAB — COMPLETE METABOLIC PANEL WITH GFR
ALBUMIN: 4.1 g/dL (ref 3.6–5.1)
ALK PHOS: 89 U/L (ref 40–115)
ALT: 11 U/L (ref 9–46)
AST: 10 U/L (ref 10–40)
BUN: 10 mg/dL (ref 7–25)
CHLORIDE: 105 mmol/L (ref 98–110)
CO2: 27 mmol/L (ref 20–31)
CREATININE: 0.7 mg/dL (ref 0.60–1.35)
Calcium: 9.9 mg/dL (ref 8.6–10.3)
GFR, Est African American: >89 mL/min (ref >=60)
GFR, Est Non African American: >89 mL/min (ref >=60)
GLUCOSE: 113 mg/dL — AB (ref 65–99)
POTASSIUM: 4.1 mmol/L (ref 3.5–5.3)
SODIUM: 140 mmol/L (ref 135–146)
Total Bilirubin: 0.2 mg/dL (ref 0.2–1.2)
Total Protein: 7.3 g/dL (ref 6.1–8.1)

## 2015-10-30 LAB — CBC
HCT: 40.6 % (ref 38.5–50.0)
HEMOGLOBIN: 13 g/dL — AB (ref 13.2–17.1)
MCH: 29.2 pg (ref 27.0–33.0)
MCHC: 32 g/dL (ref 32.0–36.0)
MCV: 91.2 fL (ref 80.0–100.0)
MPV: 9 fL (ref 7.5–12.5)
Platelets: 520 10*3/uL — ABNORMAL HIGH (ref 140–400)
RBC: 4.45 MIL/uL (ref 4.20–5.80)
RDW: 14.1 % (ref 11.0–15.0)
WBC: 8.1 10*3/uL (ref 3.8–10.8)

## 2015-10-30 LAB — LIPASE: LIPASE: 12 U/L (ref 7–60)

## 2015-10-30 LAB — POCT H PYLORI SCREEN: H PYLORI SCREEN, POC: NEGATIVE

## 2015-10-30 MED ORDER — OXYCODONE-ACETAMINOPHEN 5-325 MG PO TABS: 1.0000 | ORAL_TABLET | Freq: Three times a day (TID) | ORAL | Status: DC | PRN

## 2015-10-30 MED ORDER — CYCLOBENZAPRINE HCL 10 MG PO TABS: 10.0000 mg | ORAL_TABLET | Freq: Three times a day (TID) | ORAL | Status: DC | PRN

## 2015-10-30 MED ORDER — PANTOPRAZOLE SODIUM 40 MG PO TBEC: 40.0000 mg | DELAYED_RELEASE_TABLET | Freq: Every day | ORAL | Status: DC

## 2015-10-30 NOTE — Progress Notes (Signed)
   HPI  CC: New abdominal pain Patient is here with complaints of new abdominal pain. He is a long history of abdominal pain in the past however he states that this is new and different. Pain is located in the epigastrium with some involvement of the right upper quadrant. Pain has been going on for the past 3 weeks. Pain is worsened with food and is thus made his appetite diminished. Pain seems to come in waves. Pain is described as burning and sharp in nature. Patient also endorses increased stomach gas and burping. Symptoms improved with ingestion of some baking soda dissolved in water. Patient endorses nausea. Patient denies any fever, chills, chest pain, vomiting, diarrhea, hematochezia, melena, headache, lightheadedness, dizziness, weakness.  Patient continues to smoke and tends to drink a significant amount of coffee and caffeinated beverages throughout the day.  CC, SH/smoking status, and VS noted  Objective: BP 136/86 mmHg  Pulse 117  Temp(Src) 97.7 F (36.5 C) (Oral)  Ht 5\' 10"  (1.778 m)  Wt 164 lb 11.2 oz (74.707 kg)  BMI 23.63 kg/m2 Gen: NAD, alert, cooperative, and pleasant. CV: RRR, no murmur, no conjunctival pallor, appropriate capillary refill Abd: Soft, tenderness noted in the epigastrium and right upper quadrant. No distention present. Murphy's positive. Ext: No edema, warm   Assessment and plan:  Abdominal pain, epigastric New epigastric abdominal pain present. Etiology currently unknown. Differential includes gastric/duodenal ulcer versus gallbladder disease. - Obtaining labs: CMP/CBC/lipase/H. Pylori - Obtaining abdominal ultrasound. - Changed PPI to Protonix - I've asked patient to make a follow-up appointment with me as soon as possible after the ultrasound.    Orders Placed This Encounter  Procedures  . US Abdomen Complete    Standing Status: Future     Number of Occurrences:      Standing Expiration Date: 12/29/2016    Order Specific Question:  Reason  for Exam (SYMPTOM  OR DIAGNOSIS REQUIRED)    Answer:  epigastric and RUQ pain    Order Specific Question:  Preferred imaging location?    Answer:  [facility] WITH GFR  . CBC  . Lipase  . H.pylori screen, POC    IgG Ab. Screen    Meds ordered this encounter  Medications  . oxyCODONE-acetaminophen (ROXICET) 5-325 MG tablet    Sig: Take 1 tablet by mouth every 8 (eight) hours as needed for severe pain.    Dispense:  18 tablet    Refill:  0  . cyclobenzaprine (FLEXERIL) 10 MG tablet    Sig: Take 1 tablet (10 mg total) by mouth 3 (three) times daily as needed. for muscle spams    Dispense:  30 tablet    Refill:  0  . pantoprazole (PROTONIX) 40 MG tablet    Sig: Take 1 tablet (40 mg total) by mouth daily.    Dispense:  30 tablet    Refill:  1     Elberta Leatherwood, MD,MS,  PGY2 10/30/2015 7:29 PM

## 2015-10-30 NOTE — Patient Instructions (Signed)
It was a pleasure seeing you today in our clinic. Today we discussed your abdominal pain. Here is the treatment plan we have discussed and agreed upon together:   - I have ordered an abdominal ultrasound be obtained. My nursing staff will schedule this before you leave here today. - I've also obtain some labs. - Begin taking the pantoprazole one time daily. - I would like to see you back after the abdominal ultrasound is obtained. - Avoid all alcohol, caffeine, and tobacco use. Also, avoid all medications that are not currently prescribed to you, especially Tylenol.

## 2015-10-30 NOTE — Assessment & Plan Note (Addendum)
New epigastric abdominal pain present. Etiology currently unknown. Differential includes gastric/duodenal ulcer versus gallbladder disease. - Obtaining labs: CMP/CBC/lipase/H. Pylori - Obtaining abdominal ultrasound. - Changed PPI to Protonix - I've asked patient to make a follow-up appointment with me as soon as possible after the ultrasound.

## 2015-11-01 ENCOUNTER — Ambulatory Visit (HOSPITAL_COMMUNITY)
Admission: RE | Admit: 2015-11-01 | Discharge: 2015-11-01 | Disposition: A | Discharge status: Home / Self Care | Payer: Medicaid Other | Source: Ambulatory Visit | Attending: Family Medicine | Admitting: Family Medicine

## 2015-11-01 DIAGNOSIS — R1013 Epigastric pain: Secondary | ICD-10-CM | POA: Diagnosis present

## 2015-11-06 ENCOUNTER — Encounter: Payer: Self-pay | Admitting: Family Medicine

## 2015-11-06 ENCOUNTER — Ambulatory Visit (INDEPENDENT_AMBULATORY_CARE_PROVIDER_SITE_OTHER): Payer: Medicaid Other | Admitting: Family Medicine

## 2015-11-06 VITALS — BP 132/81 | HR 97 | Temp 97.7°F | Ht 70.0 in | Wt 170.0 lb

## 2015-11-06 DIAGNOSIS — G8929 Other chronic pain: Secondary | ICD-10-CM | POA: Diagnosis not present

## 2015-11-06 DIAGNOSIS — R1010 Upper abdominal pain, unspecified: Secondary | ICD-10-CM | POA: Diagnosis not present

## 2015-11-06 DIAGNOSIS — R1013 Epigastric pain: Secondary | ICD-10-CM | POA: Diagnosis not present

## 2015-11-06 DIAGNOSIS — R1011 Right upper quadrant pain: Principal | ICD-10-CM

## 2015-11-06 MED ORDER — OXYCODONE-ACETAMINOPHEN 5-325 MG PO TABS: 1.0000 | ORAL_TABLET | Freq: Three times a day (TID) | ORAL | Status: DC | PRN

## 2015-11-06 NOTE — Patient Instructions (Signed)
It was a pleasure seeing you today in our clinic. Today we discussed Your abdominal pain. Here is the treatment plan we have discussed and agreed upon together:   - I'm glad that your abdominal pain has essentially resolved. We will continue your Protonix medication over the next 2 months. At that time I would like to discuss whether or not he would like to continue this medication or change it to a more suitable long-term  Medication. - Stay active, reduced your smoking, stay away from lifting heavy objects. - I will see you back in one month.

## 2015-11-06 NOTE — Progress Notes (Signed)
   HPI  CC: Follow-up on abdominal pain Patient is here for follow-up on his epigastric abdominal pain. Last visit he had been complaining of severe constant abdominal pain which was causing anorexia, vomiting, nausea. No reported diarrhea at the time. Patient was afebrile. He had a slightly positive Murphy sign. Patient has a well-documented abdominal surgery history. Labs were drawn and abdominal ultrasound was obtained. Ultrasound was normal and labs did not indicate any evidence of H. Pylori infection or biliary obstruction. Lipase was normal. Patient was started on Protonix during that visit.  Patient states that pain was significantly improved with the initiation of PPI. Since that time patient has regained his appetite and had no nausea or vomiting. Weight is currently up 6 pounds in one week and patient has no evidence of edema or respiratory dysfunction.  We discussed at length some of the risks of long-term PPI use, aas patient was asking if this could be something he could  Be prescribed long-term. Patient is currently asymptomatic.  Review of Systems   See HPI for ROS. All other systems reviewed and are negative.  CC, SH/smoking status, and VS noted  Objective: BP 132/81 mmHg  Pulse 97  Temp(Src) 97.7 F (36.5 C) (Oral)  Ht 5\' 10"  (1.778 m)  Wt 170 lb (77.111 kg)  BMI 24.39 kg/m2 Gen: NAD, alert, cooperative, and pleasant. CV: RRR, no murmur Resp: CTAB, no wheezes, non-labored Abd: SNTND, BS present, no guarding, murphy's neg, surgical scarring present  Assessment and plan:  Abdominal pain, epigastric Acute pain: resolved Chronic pain: improved  Patient states that he had significant benefit after initiating the PPI therapy from the prior visit. He would like to consider having this medication as along-term prescribed medicine.  - i provided patient with 2 months worth of Protonix.We will discuss whether or not this is to be continued at a subsequent visit. - Chronic  pain is improved.I have refilled some of his narcotic in order to have his visits back to 4 weeks. He states that if his pain continues to be improved at the next visit he would like to reduce the amount of narcotic seeking in the next visit.   Meds ordered this encounter  Medications  . oxyCODONE-acetaminophen (ROXICET) 5-325 MG tablet    Sig: Take 1 tablet by mouth every 8 (eight) hours as needed for severe pain.    Dispense:  8 tablet    Refill:  0     Elberta Leatherwood, MD,MS,  PGY2 11/06/2015 5:20 PM

## 2015-11-06 NOTE — Assessment & Plan Note (Signed)
Acute pain: resolved Chronic pain: improved  Patient states that he had significant benefit after initiating the PPI therapy from the prior visit. He would like to consider having this medication as along-term prescribed medicine.  - i provided patient with 2 months worth of Protonix.We will discuss whether or not this is to be continued at a subsequent visit. - Chronic pain is improved.I have refilled some of his narcotic in order to have his visits back to 4 weeks. He states that if his pain continues to be improved at the next visit he would like to reduce the amount of narcotic seeking in the next visit.

## 2015-11-22 ENCOUNTER — Ambulatory Visit (INDEPENDENT_AMBULATORY_CARE_PROVIDER_SITE_OTHER): Payer: Medicaid Other | Admitting: Psychology

## 2015-11-22 DIAGNOSIS — F3161 Bipolar disorder, current episode mixed, mild: Secondary | ICD-10-CM | POA: Diagnosis not present

## 2015-11-22 NOTE — Assessment & Plan Note (Signed)
Stopped treatment with Latuda and now seeing a return of symptoms.  Relationships are affected.  Home function is reasonable (laundry, dishes) but doesn't want to do much else.  Agreed to restart Latuda.  Dr. Tammi Klippel remains concerned that the Buspar / Latuda combo might be creating some irritability.  Discussed whether [name] would be okay with further backing off Buspar.  He agreed to cut back on morning dose.  Keep the evening one because it helps him sleep.  See patient instructions for further plan.

## 2015-11-22 NOTE — Progress Notes (Signed)
Reason for follow-up:  Follow up mood issues.  He ran out of [name] in March and decided to not restart because he was feeling good energy.  That has since turned to irritability and a sense of apathy.    Issues discussed:  He is down to two [facility] a day secondary to some stomach issues he was having.  Smokes about 2 ppd but it also using a nicotine inhaler - trying to cut back.  Denies the use of other drugs including alcohol and THC.  In a relationship for about a year with a hairdresser.  [name] still living at home.  Having more behavioral issues with him.  Identified goals:  Restart medication.  Consider behavioral strategies.

## 2015-11-22 NOTE — Patient Instructions (Addendum)
Please schedule a follow-up June 7th at 9:00. Dr. Tammi Klippel recommended you restart 20 mg of Latuda daily.  You have 40 mgs at home so cut those in half.  You can bump up to 40 mg if you think that would be helpful. Dr. Tammi Klippel also recommended cutting back on your morning Buspar.  Cut back to 7.5 mg at first with the goal of getting rid of it all together.  Keep the evening dose.  Dr. Tammi Klippel thinks the Buspar might be making your motivation / interest less than you would like. We would really like to get a daily sense of how you are doing with: 1. Irritability 2. Lack of interest / motivation 3. Sadness / depression If you could mark in your book on a 0-3 point scale these things, that would be good.    Experiment with pushing yourself even if you don't feel like it.  Notice how it feels on the other side.  If all you do is belly ache this is a failed experiment.

## 2015-11-28 ENCOUNTER — Ambulatory Visit (INDEPENDENT_AMBULATORY_CARE_PROVIDER_SITE_OTHER): Payer: Medicaid Other | Admitting: Family Medicine

## 2015-11-28 ENCOUNTER — Encounter: Payer: Self-pay | Admitting: Family Medicine

## 2015-11-28 ENCOUNTER — Ambulatory Visit: Payer: Medicaid Other | Admitting: Family Medicine

## 2015-11-28 VITALS — BP 138/88 | HR 100 | Temp 98.2°F | Ht 70.0 in | Wt 166.0 lb

## 2015-11-28 DIAGNOSIS — G8929 Other chronic pain: Secondary | ICD-10-CM

## 2015-11-28 DIAGNOSIS — R1010 Upper abdominal pain, unspecified: Secondary | ICD-10-CM | POA: Diagnosis not present

## 2015-11-28 DIAGNOSIS — R1011 Right upper quadrant pain: Principal | ICD-10-CM

## 2015-11-28 MED ORDER — OXYCODONE-ACETAMINOPHEN 5-325 MG PO TABS: 1.0000 | ORAL_TABLET | Freq: Three times a day (TID) | ORAL | Status: DC | PRN

## 2015-11-28 MED ORDER — CYCLOBENZAPRINE HCL 10 MG PO TABS: 10.0000 mg | ORAL_TABLET | Freq: Three times a day (TID) | ORAL | Status: DC | PRN

## 2015-11-28 NOTE — Progress Notes (Signed)
   HPI CC: Medication Refill   Patient has been doing well. Pain has been under good control over the past month. Pain has been mostly at his baseline (with some new "cramping" in is RUQ), it is generally localized to his abdomen, no radiation, and there are no acute changes. Pain medications continue to make daily pain more tolerable. Patient was interested in decreasing the total amount of oxycodone tablets he receives this month.  ROS: Patient denies any fever, chills, headache, blurred vision, fatigue, shortness of breath, chest pain, nausea, vomiting, diarrhea, weakness, or numbness. Patient does endorse some occasional constipation.  Past medical history and social history reviewed and updated in the EMR as appropriate.  Objective: BP 138/88 mmHg  Pulse 100  Temp(Src) 98.2 F (36.8 C)  Ht 5\' 10"  (1.778 m)  Wt 166 lb (75.297 kg)  BMI 23.82 kg/m2  SpO2 99% Gen: NAD, alert, cooperative, and pleasant. HEENT: NCAT, EOMI, PERRL CV: RRR, no murmur Resp: CTAB, mild wheezing noted w/ deep inspiration.  Abd: Soft, generalized tenderness (at his baseline), extensive surgical scarring present, BS present, no guarding or organomegaly  Ext: No edema, warm Neuro: Alert and oriented, Speech clear, No gross deficits  Assessment and plan:  Abdominal pain, chronic, diffuse Stable/improved: Patient states his pain has been very well controlled over the past 2 months. He feels comfortable today reducing the amount of narcotics that he should be receiving this month. We discussed this at length he states that it is important to him to continue trying to decrease his pain meds. No additional complaints today. - Refilling oxycodone. Total of 17 tablets (down from 18 tablets) - Endorse some occasional constipation. Encouraged having some over-the-counter MiraLAX on hand in case he is ever in need of this. Constipation is likely secondary to chronic opiate use. - Follow-up in one month    Meds  ordered this encounter  Medications  . oxyCODONE-acetaminophen (ROXICET) 5-325 MG tablet    Sig: Take 1 tablet by mouth every 8 (eight) hours as needed for severe pain.    Dispense:  17 tablet    Refill:  0  . cyclobenzaprine (FLEXERIL) 10 MG tablet    Sig: Take 1 tablet (10 mg total) by mouth 3 (three) times daily as needed. for muscle spams    Dispense:  30 tablet    Refill:  0     Elberta Leatherwood, MD,MS,  PGY2 11/28/2015 9:59 PM

## 2015-11-28 NOTE — Assessment & Plan Note (Addendum)
Stable/improved: Patient states his pain has been very well controlled over the past 2 months. He feels comfortable today reducing the amount of narcotics that he should be receiving this month. We discussed this at length he states that it is important to him to continue trying to decrease his pain meds. No additional complaints today. - Refilling oxycodone. Total of 17 tablets (down from 18 tablets) - Endorse some occasional constipation. Encouraged having some over-the-counter MiraLAX on hand in case he is ever in need of this. Constipation is likely secondary to chronic opiate use. - Follow-up in one month

## 2015-11-28 NOTE — Patient Instructions (Signed)
It was a pleasure seeing you today in our clinic. Today we discussed your pain. Here is the treatment plan we have discussed and agreed upon together:   - I'm glad we are able to get your pain under control from your more recent visits. Continue taking the Protonix as prescribed and we will reassess next month. - I've refilled your medications at this time. - As always, your best to try to stay as active as possible. And let me know if you have any questions or comments.

## 2015-12-14 ENCOUNTER — Other Ambulatory Visit: Payer: Self-pay | Admitting: Family Medicine

## 2015-12-14 ENCOUNTER — Encounter: Payer: Self-pay | Admitting: Family Medicine

## 2015-12-14 ENCOUNTER — Ambulatory Visit (INDEPENDENT_AMBULATORY_CARE_PROVIDER_SITE_OTHER): Payer: Medicaid Other | Admitting: Family Medicine

## 2015-12-14 VITALS — BP 139/88 | HR 95 | Temp 98.4°F | Ht 70.0 in | Wt 169.0 lb

## 2015-12-14 DIAGNOSIS — R1010 Upper abdominal pain, unspecified: Secondary | ICD-10-CM | POA: Diagnosis present

## 2015-12-14 DIAGNOSIS — R1013 Epigastric pain: Secondary | ICD-10-CM

## 2015-12-14 DIAGNOSIS — R1011 Right upper quadrant pain: Principal | ICD-10-CM

## 2015-12-14 DIAGNOSIS — G8929 Other chronic pain: Secondary | ICD-10-CM

## 2015-12-14 MED ORDER — OXYCODONE-ACETAMINOPHEN 5-325 MG PO TABS: 1.0000 | ORAL_TABLET | Freq: Three times a day (TID) | ORAL | Status: DC | PRN

## 2015-12-14 MED ORDER — PANTOPRAZOLE SODIUM 40 MG PO TBEC: 80.0000 mg | DELAYED_RELEASE_TABLET | Freq: Every day | ORAL | Status: DC

## 2015-12-14 MED ORDER — OXYCODONE-ACETAMINOPHEN 5-325 MG PO TABS: 10.0000 | ORAL_TABLET | Freq: Three times a day (TID) | ORAL | Status: DC | PRN

## 2015-12-14 NOTE — Assessment & Plan Note (Signed)
Some increased abdominal pain secondary to the new epigastric discomfort. Patient is asking for some additional pain meds at this time. - Oxycodone 10

## 2015-12-14 NOTE — Progress Notes (Signed)
   HPI CC: Medication Refill and epigastric pain  Patient has been doing well, but continues to complain of epigastric pain. Epigastric pain is partially relieved with recently prescribed PPI. Epigastric pain is made worse with food and is sharp/burning in nature. Chronic abdominal pain has been under decent control over the past month. Pain has been slightly higher than his typical baseline. No radiation, and no acute changes. Pain medications continue to make daily pain more tolerable. Patient has had to use slightly more pain medication to help control his pain secondary to this new epigastric pain that has developed.  ROS: Patient endorses some constipation and hard stools. He denies any melena, hematochezia, chest pain, shortness of breath, diarrhea, dysuria, nausea, vomiting, lightheadedness, or dizziness.  Past medical history and social history reviewed and updated in the EMR as appropriate.  Objective: BP 139/88 mmHg  Pulse 95  Temp(Src) 98.4 F (36.9 C) (Oral)  Ht 5\' 10"  (1.778 m)  Wt 169 lb (76.658 kg)  BMI 24.25 kg/m2  SpO2 97% Gen: NAD, alert, cooperative, and pleasant. HEENT: NCAT, EOMI, PERRL CV: RRR, no murmur Resp: CTAB, prolonged expiratory phase. Abd: Soft, generalized tenderness (at his baseline), increased epigastric tenderness to palpation. Extensive surgical scarring present, BS present, no guarding or organomegaly    Assessment and plan:  Abdominal pain, epigastric Stable/worsening: Patient has been complaining of increased epigastric tenderness over the past 1-2 months. Pain has been significantly improved with PPI. At the previous visit patient's H. pylori screen was negative. There is concern for gastric ulcer as patient has significant caffeine/coffee ingestion some stress. - I've increased pantoprazole dose to 80 mg daily. - Referral to gastroenterology as I believe patient may benefit from EGD. - No signs or symptoms concerning for gastric bleed/anemia;  but consider CBC check in future or if becomes symptomatic.   Abdominal pain, chronic, diffuse Some increased abdominal pain secondary to the new epigastric discomfort. Patient is asking for some additional pain meds at this time. - Oxycodone 10    Orders Placed This Encounter  Procedures  . Ambulatory referral to Gastroenterology    Referral Priority:  Routine    Referral Type:  Consultation    Referral Reason:  Specialty Services Required    Number of Visits Requested:  1    Meds ordered this encounter  Medications  . DISCONTD: oxyCODONE-acetaminophen (ROXICET) 5-325 MG tablet    Sig: Take 10 tablets by mouth every 8 (eight) hours as needed for severe pain.    Dispense:  17 tablet    Refill:  0  . DISCONTD: pantoprazole (PROTONIX) 40 MG tablet    Sig: Take 2 tablets (80 mg total) by mouth daily.    Dispense:  60 tablet    Refill:  1  . oxyCODONE-acetaminophen (ROXICET) 5-325 MG tablet    Sig: Take 1 tablet by mouth every 8 (eight) hours as needed for severe pain.    Dispense:  10 tablet    Refill:  0     Elberta Leatherwood, MD,MS,  PGY2 12/14/2015 1:35 PM

## 2015-12-14 NOTE — Assessment & Plan Note (Signed)
Stable/worsening: Patient has been complaining of increased epigastric tenderness over the past 1-2 months. Pain has been significantly improved with PPI. At the previous visit patient's H. pylori screen was negative. There is concern for gastric ulcer as patient has significant caffeine/coffee ingestion some stress. - I've increased pantoprazole dose to 80 mg daily. - Referral to gastroenterology as I believe patient may benefit from EGD. - No signs or symptoms concerning for gastric bleed/anemia; but consider CBC check in future or if becomes symptomatic.

## 2015-12-14 NOTE — Patient Instructions (Signed)
It was a pleasure seeing you today in our clinic. Today we discussed your sty and abdominal pain. Here is the treatment plan we have discussed and agreed upon together:   - I would like for you to apply warm compresses to 3 times a day for about 30 minutes each session to painful area around her eye. If the irritation/redness appears to be spreading across her lower eyelid then I like to have you back to reevaluate this area. This issue should resolve on its own over the next 3-5 days. - Begin taking 2 tablets of your pantoprazole daily. - I've sent in referral to gastroenterology. He'll be contacted to set up an appointment with them.

## 2015-12-27 ENCOUNTER — Telehealth: Payer: Self-pay | Admitting: Student

## 2015-12-27 ENCOUNTER — Ambulatory Visit (INDEPENDENT_AMBULATORY_CARE_PROVIDER_SITE_OTHER): Payer: Medicaid Other | Admitting: Family Medicine

## 2015-12-27 ENCOUNTER — Other Ambulatory Visit: Payer: Self-pay | Admitting: General Surgery

## 2015-12-27 ENCOUNTER — Ambulatory Visit: Payer: Medicaid Other | Admitting: Psychology

## 2015-12-27 VITALS — BP 152/92 | HR 107 | Temp 98.2°F | Resp 18 | Ht 70.0 in | Wt 163.8 lb

## 2015-12-27 DIAGNOSIS — L98491 Non-pressure chronic ulcer of skin of other sites limited to breakdown of skin: Secondary | ICD-10-CM | POA: Diagnosis not present

## 2015-12-27 DIAGNOSIS — R1010 Upper abdominal pain, unspecified: Secondary | ICD-10-CM | POA: Diagnosis not present

## 2015-12-27 DIAGNOSIS — I10 Essential (primary) hypertension: Secondary | ICD-10-CM | POA: Diagnosis not present

## 2015-12-27 DIAGNOSIS — L98499 Non-pressure chronic ulcer of skin of other sites with unspecified severity: Secondary | ICD-10-CM | POA: Insufficient documentation

## 2015-12-27 DIAGNOSIS — R109 Unspecified abdominal pain: Secondary | ICD-10-CM

## 2015-12-27 DIAGNOSIS — R1011 Right upper quadrant pain: Principal | ICD-10-CM

## 2015-12-27 DIAGNOSIS — G8929 Other chronic pain: Secondary | ICD-10-CM

## 2015-12-27 MED ORDER — DOXYCYCLINE HYCLATE 100 MG PO TABS: 100.0000 mg | ORAL_TABLET | Freq: Two times a day (BID) | ORAL | Status: DC

## 2015-12-27 MED ORDER — OXYCODONE-ACETAMINOPHEN 5-325 MG PO TABS: 1.0000 | ORAL_TABLET | Freq: Three times a day (TID) | ORAL | Status: DC | PRN

## 2015-12-27 MED ORDER — LOSARTAN POTASSIUM-HCTZ 100-12.5 MG PO TABS: 1.0000 | ORAL_TABLET | Freq: Every day | ORAL | Status: DC

## 2015-12-27 NOTE — Progress Notes (Signed)
   HPI  CC: Abdominal skin tear Patient is here with complaints of abdominal skin tear. He states that he first noted some redness and irritation of the site a little over a week ago. Not short after, the site of redness and irritation showed evidence of tearing with some drainage. He immediately called his surgeon for an urgent appointment. Surgeon has seen this site and has ordered an abdominal CT for further evaluation.  Patient states that he continues to have pain at the site with some drainage. He is worried that there could be infection. And he is wondering what we can do.  ROS: Patient denies any fevers, chills, diaphoresis, nausea, vomiting, diarrhea.  Objective: BP 152/92 mmHg  Pulse 107  Temp(Src) 98.2 F (36.8 C) (Oral)  Resp 18  Ht 5\' 10"  (1.778 m)  Wt 163 lb 12.8 oz (74.299 kg)  BMI 23.50 kg/m2  SpO2 99% Gen: NAD, alert, cooperative, and pleasant. CV: RRR, no murmur Resp: CTAB, no wheezes, non-labored Abd: SNTND, BS present, no guarding or organomegaly. Significant soft tissue scarring noted from previous abdominal surgeries. 1 cm diameter (~42mm deep) ulceration noted at midline scar below the navel. Erythema and granulation tissue noted at this site. Some serous drainage present. Site is extremely tender to palpation. No evidence of tunneling. Some left-sided dullness/firmness noted along the adipose tissue of the LLQ. No significant pain at that site.   Assessment and plan:  Skin ulcer of abdominal wall ([place]) Patient is here with a 1 cm diameter skin ulceration at the midline lower abdomen. No evidence of trauma to the site. Concern for possible complication from indwelling sutures, from previous abdominal surgeries. There is an area of firmness extending from the site along the abdominal adipose tissue that is concerning for possible adipose necrosis. - Doxycycline 100 mg twice a day 10 days (I have asked patient to call his surgeon's office prior to initiating this  medication to ensure that this treatment will not prevent surgical intervention if deemed necessary) - Bacterial culture at this site deemed unnecessary as it would be highly unlikely to change treatment course. - I've refilled patient's pain medications due to this extraordinary circumstance.  Essential hypertension, benign Patient's blood pressure was significantly elevated today. Likely secondary to pain however previous blood pressure was on the higher end of normal. - I've added 12.5 mg hydrochlorothiazide the patient's antihypertensive medication regimen.   Meds ordered this encounter  Medications  . doxycycline (VIBRA-TABS) 100 MG tablet    Sig: Take 1 tablet (100 mg total) by mouth 2 (two) times daily.    Dispense:  20 tablet    Refill:  0  . oxyCODONE-acetaminophen (ROXICET) 5-325 MG tablet    Sig: Take 1 tablet by mouth every 8 (eight) hours as needed for severe pain.    Dispense:  30 tablet    Refill:  0  . losartan-hydrochlorothiazide (HYZAAR) 100-12.5 MG tablet    Sig: Take 1 tablet by mouth daily.    Dispense:  90 tablet    Refill:  3     Elberta Leatherwood, MD,MS,  PGY2 12/27/2015 7:18 PM

## 2015-12-27 NOTE — Assessment & Plan Note (Signed)
Patient's blood pressure was significantly elevated today. Likely secondary to pain however previous blood pressure was on the higher end of normal. - I've added 12.5 mg hydrochlorothiazide the patient's antihypertensive medication regimen.

## 2015-12-27 NOTE — Patient Instructions (Signed)
It was a pleasure seeing you today in our clinic. Today we discussed your abdominal wound and blood pressure. Here is the treatment plan we have discussed and agreed upon together:   - I've placed you on doxycycline. Take this 2 times a day for the next 10 days. Please contact your surgeon to inform him/her that this was prescribed in case they feel that this would negatively affect their treatment plans. - I have placed you on a new medication at this time. This medication is to be taken once a day. It combines your previous losartan medication with hydrochlorothiazide. This may help reduce her blood pressure further.

## 2015-12-27 NOTE — Assessment & Plan Note (Addendum)
Patient is here with a 1 cm diameter skin ulceration at the midline lower abdomen. No evidence of trauma to the site. Concern for possible complication from indwelling sutures, from previous abdominal surgeries. There is an area of firmness extending from the site along the abdominal adipose tissue that is concerning for possible adipose necrosis. - Doxycycline 100 mg twice a day 10 days (I have asked patient to call his surgeon's office prior to initiating this medication to ensure that this treatment will not prevent surgical intervention if deemed necessary) - Bacterial culture at this site deemed unnecessary as it would be highly unlikely to change treatment course. - I've refilled patient's pain medications due to this extraordinary circumstance.

## 2015-12-27 NOTE — Telephone Encounter (Signed)
Called because pharmacy denied Hyzaar script as he needs a prior authorization. Patient directed to call the [facility] in the AM for this from his PCp

## 2015-12-28 ENCOUNTER — Telehealth: Payer: Self-pay | Admitting: *Deleted

## 2015-12-28 NOTE — Telephone Encounter (Signed)
Prior Authorization received from [employer] for Losartan-HCTZ 100-12.5 mg. PA approved via [employer] until 12/27/16.  Approval number: [id number].  Patient has tried and failed Lisinopril 10 mg, 40 mg due to cough.  Derl Barrow, RN

## 2016-01-03 ENCOUNTER — Ambulatory Visit: Payer: Medicaid Other | Admitting: Psychology

## 2016-01-08 ENCOUNTER — Ambulatory Visit
Admission: RE | Admit: 2016-01-08 | Discharge: 2016-01-08 | Disposition: A | Discharge status: Home / Self Care | Payer: Medicaid Other | Source: Ambulatory Visit | Attending: General Surgery | Admitting: General Surgery

## 2016-01-08 DIAGNOSIS — R109 Unspecified abdominal pain: Secondary | ICD-10-CM

## 2016-01-08 MED ORDER — IOPAMIDOL (ISOVUE-300) INJECTION 61%
100.0000 mL | Freq: Once | INTRAVENOUS | Status: AC | PRN
  Administered 2016-01-08: 100 mL via INTRAVENOUS

## 2016-01-09 ENCOUNTER — Telehealth: Payer: Self-pay | Admitting: Family Medicine

## 2016-01-09 NOTE — Telephone Encounter (Signed)
EMERGENCY PHONE LINE: Returned page to [facility] [employer]. States that he wanted to talk to Dr. Alease Frame. Discussed that Dr. Alease Frame is not available and is currently on vacation. States there is nothing else I can do for him.   Dr. Gerlean Ren 01/09/16, 7:04 PM

## 2016-01-09 NOTE — Telephone Encounter (Signed)
Pt called and would like to speak to Dr. Alease Frame and about a very personal matter involving some test results he received. jw

## 2016-01-17 ENCOUNTER — Ambulatory Visit (INDEPENDENT_AMBULATORY_CARE_PROVIDER_SITE_OTHER): Payer: Medicaid Other | Admitting: Psychology

## 2016-01-17 ENCOUNTER — Other Ambulatory Visit: Payer: Self-pay | Admitting: Family Medicine

## 2016-01-17 DIAGNOSIS — R1011 Right upper quadrant pain: Principal | ICD-10-CM

## 2016-01-17 DIAGNOSIS — F3162 Bipolar disorder, current episode mixed, moderate: Secondary | ICD-10-CM

## 2016-01-17 DIAGNOSIS — G8929 Other chronic pain: Secondary | ICD-10-CM

## 2016-01-17 MED ORDER — OXYCODONE-ACETAMINOPHEN 5-325 MG PO TABS: 1.0000 | ORAL_TABLET | Freq: Three times a day (TID) | ORAL | Status: DC | PRN

## 2016-01-17 NOTE — Assessment & Plan Note (Signed)
Report of mood is down.  He looks and feels unwell.  He reports he is sleeping a great deal.  Feeling fatigued.  He has had to pack his wound and is on antibiotics.  He has lost weight.  Continues to smoke.  Language was very passive initially but shifted to something more active as the meeting progressed (more hopeful about a positive outcome).  He has contemplated the thought of dying if he had to have another surgery.  He is not optimistic about how he would do.  Provided support and attempted some MI strategies around smoking cessation and nutrition.      He is up to date with his prescriptions.  See patient instructions for further plan.

## 2016-01-17 NOTE — Telephone Encounter (Signed)
Discussed patient's developing issue. Came to an agreement and refilled Rx

## 2016-01-17 NOTE — Patient Instructions (Signed)
We will see you back on 8/16th at 9:00.  So sorry to hear about your stomach.  Dr. Tammi Klippel talked about the importance of good nutrition and mood control in healing (not to mention stopping smoking).  : - )  Please take good care of yourself.  Call me if you need to.  Glad you have good support at home.

## 2016-01-17 NOTE — Progress Notes (Signed)
Reason for follow-up:  Timothy [name] presents for continued follow-up for mood issues.  He has had a host of stomach issues that have taken a toll on him physically and mentally.  Issues discussed:  He reports that family and friends have noticed a difference since he restarted the [name].  He also decreased his Buspar and is just taking his evening dose.  This has gone fine.  The main focus of our meeting was on his adjustment to his medical issues.  It has gotten him scared and feeling down.  He reports good support through his girlfriend and parents.  We discussed behavioral strategies that might promote healing such as proper nutrition and stopping smoking as well as attention to his mood.  Identified goals:  Continue to talk about this situation (rather than isolate) and get the support he needs.

## 2016-01-29 ENCOUNTER — Ambulatory Visit (INDEPENDENT_AMBULATORY_CARE_PROVIDER_SITE_OTHER): Payer: Medicaid Other | Admitting: Family Medicine

## 2016-01-29 ENCOUNTER — Encounter: Payer: Self-pay | Admitting: Family Medicine

## 2016-01-29 VITALS — BP 143/80 | HR 86 | Temp 97.7°F | Wt 160.2 lb

## 2016-01-29 DIAGNOSIS — R1010 Upper abdominal pain, unspecified: Secondary | ICD-10-CM

## 2016-01-29 DIAGNOSIS — G8929 Other chronic pain: Secondary | ICD-10-CM | POA: Diagnosis not present

## 2016-01-29 DIAGNOSIS — R1011 Right upper quadrant pain: Principal | ICD-10-CM

## 2016-01-29 MED ORDER — PANTOPRAZOLE SODIUM 40 MG PO TBEC: 40.0000 mg | DELAYED_RELEASE_TABLET | Freq: Every day | ORAL | Status: DC

## 2016-01-29 MED ORDER — OXYCODONE-ACETAMINOPHEN 5-325 MG PO TABS: 1.0000 | ORAL_TABLET | Freq: Three times a day (TID) | ORAL | Status: DC | PRN

## 2016-01-29 MED ORDER — AMITRIPTYLINE HCL 50 MG PO TABS: 50.0000 mg | ORAL_TABLET | Freq: Every day | ORAL | Status: DC

## 2016-01-29 NOTE — Assessment & Plan Note (Signed)
Improved but overall Worse: Pain is above his baseline. With that said pain is significantly improved from the previous visit. - PPI, patient is been experiencing improved symptoms with this addition - Refill pain meds - Initiate amitriptyline, to help chronic pain as well as insomnia.

## 2016-01-29 NOTE — Progress Notes (Signed)
   HPI CC: Abdominal pain Patient is here with continued abdominal pain. Pain is improved from previous visit but still greater than his typical baseline. Pain has been keeping him up at night. Pain is localized to his lower/central abdomen with some extension to the left lower quadrant. Patient has been followed by surgery and he has an endoscopy scheduled within the next 2 weeks. He endorses improved pain and decreased nausea with PPI. He denies any fevers, chills, vomiting, hematuria, hematemesis, hematochezia. Patient endorses nausea, abdominal pain, and insomnia (2/2 pain)  Review of Systems   See HPI for ROS. All other systems reviewed and are negative.  Past medical history and social history reviewed and updated in the EMR as appropriate.  Objective: BP 143/80 mmHg  Pulse 86  Temp(Src) 97.7 F (36.5 C) (Oral)  Wt 160 lb 3.2 oz (72.666 kg)  SpO2 98% Gen: NAD, alert, cooperative, and pleasant. CV: RRR, no murmur Resp: CTAB, mild wheezing noted w/ deep inspiration.  Abd: Soft, generalized tenderness greatest at the RLQ, extensive surgical scarring present, healing scar tissue along the midline of the lower abdomen (significantly improved from previous exam), BS present, no guarding or organomegaly  Ext: No edema, warm Neuro: Alert and oriented, Speech clear, No gross deficits  Assessment and plan:  Abdominal pain, chronic, diffuse Improved but overall Worse: Pain is above his baseline. With that said pain is significantly improved from the previous visit. - PPI, patient is been experiencing improved symptoms with this addition - Refill pain meds - Initiate amitriptyline, to help chronic pain as well as insomnia.    Meds ordered this encounter  Medications  . pantoprazole (PROTONIX) 40 MG tablet    Sig: Take 1 tablet (40 mg total) by mouth daily.    Dispense:  90 tablet    Refill:  1  . amitriptyline (ELAVIL) 50 MG tablet    Sig: Take 1 tablet (50 mg total) by mouth at  bedtime.    Dispense:  30 tablet    Refill:  5  . oxyCODONE-acetaminophen (ROXICET) 5-325 MG tablet    Sig: Take 1 tablet by mouth every 8 (eight) hours as needed for severe pain.    Dispense:  30 tablet    Refill:  0     Elberta Leatherwood, MD,MS,  PGY2 01/29/2016 6:25 PM

## 2016-01-29 NOTE — Patient Instructions (Signed)
It was a pleasure seeing you today in our clinic. Today we discussed your pain. Here is the treatment plan we have discussed and agreed upon together:   - I've refilled your medications. - Take 1 tablet of the Protonix daily. - I've started you on a new medication for her pain. Amitriptyline, take this 1 time daily at night.

## 2016-02-13 ENCOUNTER — Encounter: Payer: Self-pay | Admitting: Gastroenterology

## 2016-02-13 ENCOUNTER — Ambulatory Visit (INDEPENDENT_AMBULATORY_CARE_PROVIDER_SITE_OTHER): Payer: Medicaid Other | Admitting: Gastroenterology

## 2016-02-13 VITALS — BP 108/58 | HR 72 | Ht 70.0 in | Wt 160.0 lb

## 2016-02-13 DIAGNOSIS — K5900 Constipation, unspecified: Secondary | ICD-10-CM

## 2016-02-13 DIAGNOSIS — R1084 Generalized abdominal pain: Secondary | ICD-10-CM | POA: Diagnosis not present

## 2016-02-13 DIAGNOSIS — K219 Gastro-esophageal reflux disease without esophagitis: Secondary | ICD-10-CM

## 2016-02-13 MED ORDER — PANTOPRAZOLE SODIUM 40 MG PO TBEC: 40.0000 mg | DELAYED_RELEASE_TABLET | Freq: Two times a day (BID) | ORAL | 1 refills | Status: DC

## 2016-02-13 NOTE — Patient Instructions (Signed)
Take your Protonix 40 mg one tablet by mouth twice daily before breakfast and dinner.   You can use Tums or Maalox as needed for breakthrough symptoms.   Patient advised to avoid spicy, acidic, citrus, chocolate, mints, fruit and fruit juices.  Limit the intake of caffeine, alcohol and Soda.  Don't exercise too soon after eating.  Don't lie down within 3-4 hours of eating.  Elevate the head of your bed.  Start over the counter Miralax mixing 17 grams in 8 oz of water 1-2 x daily for constipation.  Thank you for choosing me and [facility].  Pricilla Riffle. Dagoberto Ligas., MD., Marval Regal

## 2016-02-13 NOTE — Progress Notes (Signed)
    History of Present Illness: This is a 50 year old male who had a partial colectomy for diverticulitis complicated by a leak requiring reoperation and colostomy in 2013 by Dr. Marlou Starks. He is accompanied by his girlfriend. He has since had closure of his colostomy by Dr. Marlou Starks. He then underwent repair of a large ventral hernia with mesh in 2014 by Dr. Marlou Starks. He underwent colonoscopy prior to all above in 12/2011 for evaluation of recurrent diverticulitis and rectal bleeding. Moderate diverticulosis in the sigmoid and descending colon as well as a small hyperplastic colon polyp were noted. He has a history of anal/perianal condyloma which was treated by Dr. Marlou Starks in 2013. He has had recurrent problems with generalized abdominal cramping occasionally more bothersome in the right upper quadrant. He has had significant reflux symptoms with heartburn over the past few months as well. He often has regurgitation, nausea and vomiting. He noted GERD symptoms worsen with [place] foods and tomato-based products. He was placed on Protonix by his PCP with significant improvement in symptoms. He states he has regular bowel movements however he has had problems with constipation in the past. He was evaluated by Dr. Marlou Starks in June with a draining area and his midline incision and CT scan with findings below. He was treated with a course of antibiotics and local care of the incision. His symptoms resolved. Denies weight loss, constipation, diarrhea, change in stool caliber, melena, hematochezia, dysphagia,chest pain.   Abd/pelvic CT 01/08/2016 IMPRESSION: 1. Small focal area of mild colonic wall thickening and adjacent inflammation at the junction of the descending colon and sigmoid colon most likely mild uncomplicated diverticulitis. No extraluminal air or abscess. 2. No other acute findings. 3. Moderate colonic stool burden.   Current Medications, Allergies, Past Medical History, Past Surgical History, Family History and  Social History were reviewed in [employer] record.  Physical Exam: General: Well developed, well nourished, no acute distress Head: Normocephalic and atraumatic Eyes:  sclerae anicteric, EOMI Ears: Normal auditory acuity Mouth: No deformity or lesions Lungs: Clear throughout to auscultation Heart: Regular rate and rhythm; no murmurs, rubs or bruits Abdomen: Soft, non tender and non distended. Several well healed incisions consistent with surgical history. No masses, hepatosplenomegaly or hernias noted. Normal Bowel sounds Musculoskeletal: Symmetrical with no gross deformities  Pulses:  Normal pulses noted Extremities: No clubbing, cyanosis, edema or deformities noted Neurological: Alert oriented x 4, grossly nonfocal Psychological:  Alert and cooperative. Normal mood and affect  Assessment and Recommendations:  1. GERD. Change Protonix to 40 mg twice daily. Closely follow all standard antireflux measures. Tums or Mylanta as needed for breakthrough symptoms. I've advised him to discontinue using baking soda for breakthrough symptoms. If this regimen does not effectively manage his symptoms within the next few weeks will proceed with EGD for further evaluation.  2. Generalized abdominal cramping often more bothersome in the right upper quadrant. Possible adhesion related symptoms. Rule out constipation, incomplete fecal evacuation, causing symptoms. Begin MiraLAX once or twice daily titrated for adequate daily bowel movements.

## 2016-02-29 ENCOUNTER — Encounter: Payer: Self-pay | Admitting: Family Medicine

## 2016-02-29 ENCOUNTER — Ambulatory Visit (INDEPENDENT_AMBULATORY_CARE_PROVIDER_SITE_OTHER): Payer: Medicaid Other | Admitting: Family Medicine

## 2016-02-29 DIAGNOSIS — I10 Essential (primary) hypertension: Secondary | ICD-10-CM

## 2016-02-29 DIAGNOSIS — R1011 Right upper quadrant pain: Principal | ICD-10-CM

## 2016-02-29 DIAGNOSIS — G8929 Other chronic pain: Secondary | ICD-10-CM | POA: Diagnosis not present

## 2016-02-29 DIAGNOSIS — R1010 Upper abdominal pain, unspecified: Secondary | ICD-10-CM

## 2016-02-29 MED ORDER — LOSARTAN POTASSIUM 100 MG PO TABS: 100.0000 mg | ORAL_TABLET | Freq: Every day | ORAL | 3 refills | Status: DC

## 2016-02-29 MED ORDER — AMITRIPTYLINE HCL 50 MG PO TABS: 25.0000 mg | ORAL_TABLET | Freq: Every day | ORAL | 5 refills | Status: DC

## 2016-02-29 MED ORDER — CYCLOBENZAPRINE HCL 10 MG PO TABS: 10.0000 mg | ORAL_TABLET | Freq: Three times a day (TID) | ORAL | 1 refills | Status: DC | PRN

## 2016-02-29 MED ORDER — OXYCODONE-ACETAMINOPHEN 5-325 MG PO TABS: 1.0000 | ORAL_TABLET | Freq: Three times a day (TID) | ORAL | 0 refills | Status: DC | PRN

## 2016-02-29 NOTE — Assessment & Plan Note (Signed)
Experiencing symptoms of some hypotension. Was told by his specialist to discontinue his antihypertensives. Today he is hypertensive. I have reduced his overall antihypertensive medications with removal of hydrochlorothiazide and continuing his previous dose of losartan. Will follow-up in 4 weeks.

## 2016-02-29 NOTE — Patient Instructions (Signed)
It was a pleasure seeing you today in our clinic. Today we discussed your abdominal pain and high blood pressure. Here is the treatment plan we have discussed and agreed upon together:   - I've started you on a new tablet for your blood pressure. Take 1 tablet every day. This has the same medication as your previous blood pressure medication however your previous medication had to active ingredients while this only has one. If you experience any lightheadedness, dizziness, headache, vision changes, or fainting discontinue this medications use and call our office. - I would like for you to take MiraLAX daily with a goal of experiencing 2 soft bowel movements every day. - Follow-up with me in one month

## 2016-02-29 NOTE — Assessment & Plan Note (Addendum)
Worse/stable: Has been seen by gastroenterology and general surgery. Imaging showed evidence of diverticulitis/diverticulosis. We had a long discussion about this and the likelihood that his chronic pain medications are putting him at risk for further development of diverticulosis. He denies any GERD-like symptoms like hematochezia/melena. - Daily MiraLAX with a goal of 2 loose (but solid) bowel movements daily. - Refilled pain medications - Decreased dosing of amitriptyline to 25 mg. Patient states that he had been reacting poorly to this medication with severe fatigue/tiredness. I would like to have this continued, if possible, as it may help with his chronic abdominal pain.

## 2016-02-29 NOTE — Progress Notes (Signed)
HPI  CC: Abdominal pain and hypertension Patient is here for follow-up on abdominal pain and hypertension. He states that his abdominal pain is stable or slightly improved from previous. He is been seen by his general surgeon as well as his gastroenterologist. There is evidence noted of diverticulitis/diverticulosis on imaging and these specialists believe that this is likely the cause of his worsening abdominal pain. Pain is located to the lower quadrant specifically the left. He states that he has regular bowel movements with one each morning. He endorses hard stools but does not think they are difficult to pass. He denies any blood in the stool.  Hypertension: Patient had been noted to be hypotensive while following up with his specialists. At that time they told him to hold his antihypertensives and follow up with me. Today he is slightly hypertensive at 145/85. He states he has not taken his antihypertensives in a few days. While hypotensive he states that he had endorsed some feelings of lightheadedness and weakness. He denies any symptoms of hypertension. No headaches, blurred vision, shortness of breath, chest pain, or peripheral edema.  Review of Systems   See HPI for ROS. All other systems reviewed and are negative.  CC, SH/smoking status, and VS noted  Objective: BP (!) 145/85   Pulse 100   Temp 98.3 F (36.8 C) (Oral)   Ht 5\' 10"  (1.778 m)   Wt 159 lb 3.2 oz (72.2 kg)   BMI 22.84 kg/m  Gen: NAD, alert, cooperative, and pleasant. HEENT: NCAT, EOMI, PERRL CV: RRR, no murmur Resp: CTAB, mild wheezing bilat, non-labored Abd: SNTND, extensive surgical scarring noted along the abdomen. Ext: No edema, warm   Assessment and plan:  Essential hypertension, benign Experiencing symptoms of some hypotension. Was told by his specialist to discontinue his antihypertensives. Today he is hypertensive. I have reduced his overall antihypertensive medications with removal of  hydrochlorothiazide and continuing his previous dose of losartan. Will follow-up in 4 weeks.  Abdominal pain, chronic, diffuse Worse/stable: Has been seen by gastroenterology and general surgery. Imaging showed evidence of diverticulitis/diverticulosis. We had a long discussion about this and the likelihood that his chronic pain medications are putting him at risk for further development of diverticulosis. He denies any GERD-like symptoms like hematochezia/melena. - Daily MiraLAX with a goal of 2 loose (but solid) bowel movements daily. - Refilled pain medications - Decreased dosing of amitriptyline to 25 mg. Patient states that he had been reacting poorly to this medication with severe fatigue/tiredness. I would like to have this continued, if possible, as it may help with his chronic abdominal pain.   No orders of the defined types were placed in this encounter.   Meds ordered this encounter  Medications  . cyclobenzaprine (FLEXERIL) 10 MG tablet    Sig: Take 1 tablet (10 mg total) by mouth 3 (three) times daily as needed. for muscle spams    Dispense:  30 tablet    Refill:  1  . oxyCODONE-acetaminophen (ROXICET) 5-325 MG tablet    Sig: Take 1 tablet by mouth every 8 (eight) hours as needed for severe pain.    Dispense:  30 tablet    Refill:  0  . losartan (COZAAR) 100 MG tablet    Sig: Take 1 tablet (100 mg total) by mouth daily.    Dispense:  90 tablet    Refill:  3  . amitriptyline (ELAVIL) 50 MG tablet    Sig: Take 0.5 tablets (25 mg total) by mouth at bedtime.  Dispense:  30 tablet    Refill:  5     Elberta Leatherwood, MD,MS,  PGY3 02/29/2016 2:21 PM

## 2016-03-06 ENCOUNTER — Ambulatory Visit: Payer: Self-pay | Admitting: Psychology

## 2016-03-06 ENCOUNTER — Telehealth: Payer: Self-pay | Admitting: Psychology

## 2016-03-06 NOTE — Telephone Encounter (Signed)
Called Timothy [name] to make sure he is okay.  He does not usually miss appointments.  Reports he forgot.  He states he is doing well.  Better physically stating his wound has healed.  Mood is better managed as well.  Taking Latuda, Buspar (just 15 mg at night) from [facility].  He was okay with waiting a time for [place] follow-up.  Scheduled for October 18th at 9:00.  He can call if he needs to be seen sooner.

## 2016-03-12 ENCOUNTER — Telehealth: Payer: Self-pay | Admitting: Psychology

## 2016-03-12 NOTE — Telephone Encounter (Signed)
Timothy [name] left a VM stating he needs his Timothy [name].  He has two refills but it is out of the six month window.  Called pharmacy and authorized the two refills he has remaining.  Will be due for more October 22nd.  His follow-up Timothy [name] appointment is 10/18.

## 2016-03-27 ENCOUNTER — Ambulatory Visit: Payer: Self-pay | Admitting: Family Medicine

## 2016-03-28 ENCOUNTER — Other Ambulatory Visit: Payer: Self-pay | Admitting: Family Medicine

## 2016-03-28 ENCOUNTER — Telehealth: Payer: Self-pay | Admitting: Family Medicine

## 2016-03-28 DIAGNOSIS — R1011 Right upper quadrant pain: Principal | ICD-10-CM

## 2016-03-28 DIAGNOSIS — G8929 Other chronic pain: Secondary | ICD-10-CM

## 2016-03-28 MED ORDER — OXYCODONE-ACETAMINOPHEN 5-325 MG PO TABS: 1.0000 | ORAL_TABLET | Freq: Three times a day (TID) | ORAL | 0 refills | Status: DC | PRN

## 2016-03-28 NOTE — Telephone Encounter (Signed)
Pt informed. Montina Dorrance, CMA  

## 2016-03-28 NOTE — Telephone Encounter (Signed)
Rx left up front

## 2016-03-28 NOTE — Telephone Encounter (Signed)
Has appt scheduled 04-02-16. He would to have a few oxycodene pills to make it to this appt. Please let him know when Rx is ready for pickup

## 2016-03-29 ENCOUNTER — Ambulatory Visit: Payer: Self-pay | Admitting: Family Medicine

## 2016-04-02 ENCOUNTER — Ambulatory Visit: Payer: Self-pay | Admitting: Family Medicine

## 2016-04-20 ENCOUNTER — Telehealth: Payer: Self-pay | Admitting: Internal Medicine

## 2016-04-20 ENCOUNTER — Other Ambulatory Visit: Payer: Self-pay | Admitting: Family Medicine

## 2016-04-20 NOTE — Telephone Encounter (Signed)
After Hours/ Emergency Line Call  I received a page which is likely for after hours line with the following phone number: [phone]. This number was searched in the database and was found to be the phone number of [name].   I attempted to call this number but this number is not in service. I attempted to call his contact Barbie Haggis (mother) but went to voicemail. Did not leave message as I am not sure if patient has given permission to do this.    Smiley Houseman, MD PGY-2, [place] [facility]

## 2016-04-21 ENCOUNTER — Telehealth: Payer: Self-pay | Admitting: Internal Medicine

## 2016-04-21 MED ORDER — LURASIDONE HCL 40 MG PO TABS: 40.0000 mg | ORAL_TABLET | Freq: Every day | ORAL | 0 refills | Status: DC

## 2016-04-21 NOTE — Telephone Encounter (Signed)
**  After Hours/ Emergency Line Call*  Received a call to report that Timothy [name] desires a refill on is [name]. Patient reports he has a follow up appointment in clinic in about 2 weeks. Latuda 40mg  daily, dispense #20 tabs, 0 refills phoned in   Smiley Houseman, MD PGY-2, [place] [facility]

## 2016-04-22 ENCOUNTER — Other Ambulatory Visit: Payer: Self-pay | Admitting: Family Medicine

## 2016-04-23 ENCOUNTER — Other Ambulatory Visit: Payer: Self-pay | Admitting: Family Medicine

## 2016-04-23 DIAGNOSIS — G8929 Other chronic pain: Secondary | ICD-10-CM

## 2016-04-23 DIAGNOSIS — R1011 Right upper quadrant pain: Principal | ICD-10-CM

## 2016-04-23 NOTE — Telephone Encounter (Signed)
Pt needs refills on pain medication and muscle relaxers. Pt has an appointment scheduled 05-16-16. Please advise. Thanks! ep

## 2016-04-25 ENCOUNTER — Ambulatory Visit (INDEPENDENT_AMBULATORY_CARE_PROVIDER_SITE_OTHER): Payer: Medicaid Other | Admitting: Internal Medicine

## 2016-04-25 ENCOUNTER — Ambulatory Visit: Payer: Self-pay | Admitting: Family Medicine

## 2016-04-25 ENCOUNTER — Telehealth: Payer: Self-pay | Admitting: Obstetrics and Gynecology

## 2016-04-25 ENCOUNTER — Encounter: Payer: Self-pay | Admitting: Internal Medicine

## 2016-04-25 VITALS — BP 127/89 | HR 103 | Temp 97.3°F | Wt 152.0 lb

## 2016-04-25 DIAGNOSIS — G8929 Other chronic pain: Secondary | ICD-10-CM

## 2016-04-25 DIAGNOSIS — R1011 Right upper quadrant pain: Secondary | ICD-10-CM

## 2016-04-25 DIAGNOSIS — R110 Nausea: Secondary | ICD-10-CM | POA: Diagnosis not present

## 2016-04-25 MED ORDER — CYCLOBENZAPRINE HCL 10 MG PO TABS: ORAL_TABLET | ORAL | 3 refills | Status: DC

## 2016-04-25 MED ORDER — PROMETHAZINE HCL 25 MG PO TABS: 25.0000 mg | ORAL_TABLET | Freq: Three times a day (TID) | ORAL | 0 refills | Status: DC | PRN

## 2016-04-25 MED ORDER — METRONIDAZOLE 500 MG PO TABS: 500.0000 mg | ORAL_TABLET | Freq: Three times a day (TID) | ORAL | 0 refills | Status: DC

## 2016-04-25 MED ORDER — OXYCODONE-ACETAMINOPHEN 5-325 MG PO TABS: 1.0000 | ORAL_TABLET | Freq: Three times a day (TID) | ORAL | 0 refills | Status: DC | PRN

## 2016-04-25 MED ORDER — ONDANSETRON 4 MG PO TBDP: 4.0000 mg | ORAL_TABLET | Freq: Three times a day (TID) | ORAL | 0 refills | Status: DC | PRN

## 2016-04-25 NOTE — Telephone Encounter (Signed)
Thank you Hillary for filling this patient's Oxy Rx for me!  Flexeril has been filled. Red Team please inform patient.  Thank you all for going a great job.

## 2016-04-25 NOTE — Progress Notes (Signed)
[name] Progress Note  Subjective:  Timothy [name] is a 57-yo male with history of repeat abdominal surgeries as complication of diverticulitis who presents for SDA for medication refill due to abdominal pain.   Abdominal pain: - Chronic with history of diverticulitis and multiple resulting surgeries including intestine/colon resection, colostomy and reversal, and in October 2014 had large ventral hernia repair.  - Pain has been worse over last month and he has been struggling with nausea.  - He is able to eat his meals in the morning and afternoon but has vomiting at night - Says protonix does not help. Now taking baking soda instead for GERD.  - Pain worst over LLQ and epigastric region - Saw his surgeon Dr. Marlou Starks earlier this week and earlier this month. He is on his second 10-day course of doxycycline (day 2 or 3) for abdominal skin ulcer and likely recurrent episode of diverticulitis. Wound has been closing in. Initially had greenish oozing from wound site, now minimal.  - Needing oxycodone-acetaminophen at least twice a day and has been out for about a week; when he takes this he can perform needed errands and leave the house - Last CT abdomen 01/08/16 showed mild colonic wall thickening with inflammation around descending colon/sigmoid colon c/w mild diverticulitis, no abscess seen  - Is taking miralax and having regular BMs ROS: No bloody BMs, no hematochezia, occasional subjective fevers  Social: Current smoker  Objective: Blood pressure 127/89, pulse (!) 103, temperature 97.3 F (36.3 C), temperature source Oral, weight 152 lb (68.9 kg). Body mass index is 21.81 kg/m. Constitutional: Thin male in NAD HENT: MM moist.  Cardiovascular: RRR, S1, S2, no m/r/g.  Pulmonary/Chest: Effort normal and breath sounds normal. No respiratory distress.  Abdominal: Soft. Multiple abdominal scars. <0.5 cm superficial ulcer near umbilicus with minimal serous drainage. TTP over  umbilical region of LLQ without rebound or guarding. Neurological: AOx3, no focal deficits. Skin: Abdominal wound as above.  Psychiatric: Anxious affect.  Vitals reviewed      Assessment/Plan: Abdominal pain, chronic, diffuse - Suspect having recurrent episode of diverticulitis with concurrent skin infection (improving) - Continue doxycycline as prescribed by his surgeon, added flagyl 500 mg TID x 7 days to cover for diverticulitis - Will send message to Dr. Marlou Starks letting him know about ongoing pain and addition of flagyl - Refilled patient's roxicet, #30, after reviewing [place] Controlled Substance Database - Provided zofran for nausea, per patient request - Advised him to seek emergency care if pain worsens or he becomes unable to tolerate PO - Would recommend repeating pain contract and UDS at next visit with PCP  Indication for chronic opioid: abdominal pain Medication and dose: oxycodone-acetaminophen 5-325 mg # pills per month: 30 Last UDS date: 08/26/14 Pain contract signed (Y/N): Yes per notes and patient but do not see under media Date narcotic database last reviewed (include red flags): 04/25/16 without red flags   Follow-up within a month for pain and mood.   Olene Floss, MD [place], PGY-2

## 2016-04-25 NOTE — Telephone Encounter (Signed)
Family Medicine Emergency Line Telephone Note   Patient called stating that he is having abdominal pain and nausea/vomiting. Mentions that he has a stomach infection and is following with surgeon who prescribed him antibiotics. He is calling asking what he should do for the pain. Discussed with patient that I am unable to prescribe him pain medications over the phone without evaluating him. Advised patient to use OTC medications at this time to try and control pain. Suggested that he come in tomorrow for a SDA appointment to be evaluated further. Given warning signs on when to seek help in the ED.   Patient voiced understanding and agreed to plan. Will call in AM for SDA appointment.   Luiz Blare, DO 04/25/2016, 12:56 AM PGY-3, [place]

## 2016-04-25 NOTE — Patient Instructions (Addendum)
Timothy [name],  I have added flagyl 500 mg three times daily for 1 week to take with your doxycycline.  I have prescribed zofran to take for nausea.  I will send a message to your surgeon.  Best, Dr. Ola Spurr

## 2016-04-25 NOTE — Telephone Encounter (Signed)
Pt wanted to know when he could pick up a Rx for pain medicine. Pt is coming in today at 10:45am, sick appointment. Please advise. Thanks! ep

## 2016-04-28 NOTE — Assessment & Plan Note (Addendum)
-   Suspect having recurrent episode of diverticulitis with concurrent skin infection (improving) - Continue doxycycline as prescribed by his surgeon, added flagyl 500 mg TID x 7 days to cover for diverticulitis - Will send message to Dr. Marlou Starks letting him know about ongoing pain and addition of flagyl - Refilled patient's roxicet, #30, after reviewing [place] Controlled Substance Database - Provided zofran for nausea, per patient request - Advised him to seek emergency care if pain worsens or he becomes unable to tolerate PO - Would recommend repeating pain contract and UDS at next visit with PCP  Indication for chronic opioid: abdominal pain Medication and dose: oxycodone-acetaminophen 5-325 mg # pills per month: 30 Last UDS date: 08/26/14 Pain contract signed (Y/N): Yes per notes and patient but do not see under media Date narcotic database last reviewed (include red flags): 04/25/16 without red flags

## 2016-05-02 NOTE — Telephone Encounter (Addendum)
Attempted to contact pt multiples times, no call back.Deseree Kennon Holter, CMA

## 2016-05-08 ENCOUNTER — Ambulatory Visit: Payer: Self-pay | Admitting: Psychology

## 2016-05-10 ENCOUNTER — Encounter: Payer: Self-pay | Admitting: Family Medicine

## 2016-05-10 ENCOUNTER — Ambulatory Visit (INDEPENDENT_AMBULATORY_CARE_PROVIDER_SITE_OTHER): Payer: Medicaid Other | Admitting: Family Medicine

## 2016-05-10 DIAGNOSIS — R1011 Right upper quadrant pain: Secondary | ICD-10-CM | POA: Diagnosis not present

## 2016-05-10 DIAGNOSIS — L989 Disorder of the skin and subcutaneous tissue, unspecified: Secondary | ICD-10-CM | POA: Insufficient documentation

## 2016-05-10 DIAGNOSIS — G8929 Other chronic pain: Secondary | ICD-10-CM | POA: Diagnosis not present

## 2016-05-10 DIAGNOSIS — R222 Localized swelling, mass and lump, trunk: Secondary | ICD-10-CM | POA: Insufficient documentation

## 2016-05-10 DIAGNOSIS — K668 Other specified disorders of peritoneum: Secondary | ICD-10-CM

## 2016-05-10 DIAGNOSIS — L089 Local infection of the skin and subcutaneous tissue, unspecified: Secondary | ICD-10-CM

## 2016-05-10 DIAGNOSIS — IMO0002 Reserved for concepts with insufficient information to code with codable children: Secondary | ICD-10-CM

## 2016-05-10 MED ORDER — CEPHALEXIN 500 MG PO CAPS: 500.0000 mg | ORAL_CAPSULE | Freq: Four times a day (QID) | ORAL | 0 refills | Status: AC

## 2016-05-10 MED ORDER — OXYCODONE-ACETAMINOPHEN 5-325 MG PO TABS: 1.0000 | ORAL_TABLET | Freq: Three times a day (TID) | ORAL | 0 refills | Status: DC | PRN

## 2016-05-10 NOTE — Assessment & Plan Note (Signed)
Chronic. Concerns for abscess formation at surgical site. Likely complicated by internal stiches. Patient has appointment next week with surgeon. Failed tx with doxy and flagyl. Does not appear to have acute abdomen at present. - Keflex 500 mg QID for x7 days - Complete course of Flagyl - Norco for pain PRN q6h, refilled #30 more pills to get through till see surgeon - Appt with surgery 05/13/16 at 1500 - Pt aware of red flags when to seek medical attention including diffuse abdominal pain, fever, nausea or vomiting

## 2016-05-10 NOTE — Progress Notes (Signed)
Subjective:   Patient ID: Timothy [name]    DOB: Dec 18, 1965, 50 y.o. male   MRN: [id number]  CC: Abdominal Pain  HPI: Timothy [name] is a 50 y.o. male who presents to clinic today abdominal pain. Problems discussed today are as follows:  Abdominal Pain: patient has a history of abdominal surgery and internal stiches 2/2 to diverticulosis w/ complications as of 3 years ago. He is followed by Dr. Marlou Starks at [facility]. Patient states he has internal stiches which have been getting infected. This is the 3rd time he has been seen for this. Patient was seen at [facility] 04/25/16 for same problem and discharged with Flagyl in addition to the doxy given to him by the surgeon. He completed the course. He continues to have pain and noted a small cyct appearing near the site earlier today. Says abdomen is painful around site. Had 2 normal bowel movements. Denies fevers, chills, nausea, vomiting, dyspnea, fatigue, drainage. Patient worried because last time this appeared he "got really sick after it opened and was not seen for a month."   ROS: See HPI for pertinent ROS.  Ethan: Pertinent past medical, surgical, family, and social history were reviewed and updated as appropriate. Smoking status reviewed.  Medications reviewed. Current Outpatient Prescriptions  Medication Sig Dispense Refill  . albuterol (PROVENTIL HFA;VENTOLIN HFA) 108 (90 BASE) MCG/ACT inhaler Inhale 2 puffs into the lungs every 6 (six) hours as needed for wheezing or shortness of breath. 1 Inhaler 2  . amitriptyline (ELAVIL) 50 MG tablet Take 0.5 tablets (25 mg total) by mouth at bedtime. 30 tablet 5  . busPIRone (BUSPAR) 15 MG tablet Take 15 mg by mouth 2 (two) times daily. #180 one twice a day with 3 refills.    . cephALEXin (KEFLEX) 500 MG capsule Take 1 capsule (500 mg total) by mouth 4 (four) times daily. 28 capsule 0  . cyclobenzaprine (FLEXERIL) 10 MG tablet TAKE 1 TABLET BY MOUTH THREE TIMES DAILY AS NEEDED FOR MUSCULE SPASMS  30 tablet 3  . losartan (COZAAR) 100 MG tablet Take 1 tablet (100 mg total) by mouth daily. 90 tablet 3  . losartan-hydrochlorothiazide (HYZAAR) 100-12.5 MG tablet Take 1 tablet by mouth daily.  3  . lurasidone (LATUDA) 40 MG TABS tablet Take 1 tablet (40 mg total) by mouth daily with breakfast. 20 tablet 0  . metroNIDAZOLE (FLAGYL) 500 MG tablet Take 1 tablet (500 mg total) by mouth 3 (three) times daily. 21 tablet 0  . ondansetron (ZOFRAN-ODT) 4 MG disintegrating tablet Take 1 tablet (4 mg total) by mouth every 8 (eight) hours as needed for nausea or vomiting. 30 tablet 0  . oxyCODONE-acetaminophen (ROXICET) 5-325 MG tablet Take 1 tablet by mouth every 8 (eight) hours as needed for severe pain. 30 tablet 0  . Tiotropium Bromide Monohydrate (SPIRIVA RESPIMAT) 2.5 MCG/ACT AERS Inhale 2 puffs into the lungs daily. 1 Inhaler 2   No current facility-administered medications for this visit.     Objective:   There were no vitals taken for this visit. Vitals and nursing note reviewed.  General: well nourished, well developed, mild acute distress with non-toxic appearance HEENT: normocephalic, atraumatic, moist mucous membranes Neck: supple, non-tender without lymphadenopathy CV: regular rate and rhythm without murmurs, rubs, or gallops Lungs: clear to auscultation bilaterally with normal work of breathing Abdomen: large healed central vertical scar on anterior abdomen with 1.5 cm x 1 cm cyst, tender upon palpation at site and across abdomen without guarding or rebound, no warmth  appreciated or drainage, cyst depth difficult to assess give tenderness, normoactive bowel sounds Skin: warm, dry, no rashes or lesions, cap refill < 2 seconds Extremities: warm and well perfused, normal tone      Assessment & Plan:   Skin infection Chronic. Concerns for abscess formation at surgical site. Likely complicated by internal stiches. Patient has appointment next week with surgeon. Failed tx with doxy  and flagyl. Does not appear to have acute abdomen at present. - Keflex 500 mg QID for x7 days - Complete course of Flagyl - Norco for pain PRN q6h, refilled #30 more pills to get through till see surgeon - Appt with surgery 05/13/16 at 1500 - Pt aware of red flags when to seek medical attention including diffuse abdominal pain, fever, nausea or vomiting  No orders of the defined types were placed in this encounter.  Meds ordered this encounter  Medications  . oxyCODONE-acetaminophen (ROXICET) 5-325 MG tablet    Sig: Take 1 tablet by mouth every 8 (eight) hours as needed for severe pain.    Dispense:  30 tablet    Refill:  0  . cephALEXin (KEFLEX) 500 MG capsule    Sig: Take 1 capsule (500 mg total) by mouth 4 (four) times daily.    Dispense:  28 capsule    Refill:  0    [name], [place], PGY-1 05/10/2016 4:52 PM

## 2016-05-10 NOTE — Patient Instructions (Signed)
It was a pleasure to meet you today. Please see below to review our plan for today's visit.  1. Take Keflex 500 mg 4 times per day for 7 days with meals.  2. I have refilled you pain medication for 30 tablets. Use only when needed. Take tylenol and ibuprofen for mild to moderate pain. 3. Our office has reached out to Dr. Ethlyn Gallery office. I will forward him my note. 4. If symptoms worsen, seek immediate medical attention  Please call the clinic at [phone] if your symptoms worsen or you have any concerns. It was my pleasure to see you. -- [name], [place], PGY-1  Cephalexin oral suspension What is this medicine? CEPHALEXIN (sef a LEX in) is a cephalosporin antibiotic. It is used to treat certain kinds of bacterial infections.It will not work for colds, flu, or other viral infections. This medicine may be used for other purposes; ask your health care provider or pharmacist if you have questions. What should I tell my health care provider before I take this medicine? They need to know if you have any of these conditions: -kidney disease -stomach or intestine problems, especially colitis -an unusual or allergic reaction to cephalexin, other cephalosporins, penicillins, other antibiotics, medicines, foods, dyes or preservatives -pregnant or trying to get pregnant -breast-feeding How should I use this medicine? Take this medicine by mouth. Follow the directions on your prescription label. Shake well before using. Use a specially marked spoon or container to measure your medicine. Ask your pharmacist if you do not have one. Household spoons are not accurate. You can take this medicine with food or on an empty stomach. If the medicine upsets your stomach, take it with food. Do not take your medicine more often than directed. Finish the full course prescribed by your doctor or health care professional even if you think your condition is better. Talk to your  pediatrician regarding the use of this medicine in children. While this drug may be prescribed for selected conditions, precautions do apply. Overdosage: If you think you have taken too much of this medicine contact a poison control center or emergency room at once. NOTE: This medicine is only for you. Do not share this medicine with others. What if I miss a dose? If you miss a dose, take it as soon as you can. If it is almost time for your next dose, take only that dose. Do not take double or extra doses. There should be at least 4 to 6 hours between doses. What may interact with this medicine? -probenecid -some other antibiotics This list may not describe all possible interactions. Give your health care provider a list of all the medicines, herbs, non-prescription drugs, or dietary supplements you use. Also tell them if you smoke, drink alcohol, or use illegal drugs. Some items may interact with your medicine. What should I watch for while using this medicine? Tell your doctor or health care professional if your symptoms do not begin to improve in a few days. Do not treat diarrhea with over the counter products. Contact your doctor if you have diarrhea that lasts more than 2 days or if it is severe and watery. If you have diabetes, you may get a false-positive result for sugar in your urine. Check with your doctor or health care professional. What side effects may I notice from receiving this medicine? Side effects that you should report to your doctor or health care professional as soon as possible: -allergic reactions like  skin rash, itching or hives, swelling of the face, lips, or tongue -breathing problems -pain or difficulty passing urine -redness, blistering, peeling or loosening of the skin, including inside the mouth -severe or watery diarrhea -unusually weak or tired -yellowing of the eyes, skin Side effects that usually do not require medical attention (report to your doctor or  health care professional if they continue or are bothersome): -gas or heartburn -genital or anal irritation -headache -joint or muscle pain -nausea, vomiting This list may not describe all possible side effects. Call your doctor for medical advice about side effects. You may report side effects to FDA at 1-800-FDA-1088. Where should I keep my medicine? Keep out of the reach of children. After this medicine is mixed by your pharmacist, store it in the refrigerator. Do not freeze. Throw away any unused medicine after 14 days. NOTE: This sheet is a summary. It may not cover all possible information. If you have questions about this medicine, talk to your doctor, pharmacist, or health care provider.    2016, Elsevier/Gold Standard. (2007-10-12 17:10:55)

## 2016-05-16 ENCOUNTER — Ambulatory Visit (INDEPENDENT_AMBULATORY_CARE_PROVIDER_SITE_OTHER): Payer: Medicaid Other | Admitting: Family Medicine

## 2016-05-16 VITALS — BP 154/109 | HR 113 | Temp 97.6°F | Ht 70.0 in | Wt 166.4 lb

## 2016-05-16 DIAGNOSIS — R1011 Right upper quadrant pain: Secondary | ICD-10-CM | POA: Diagnosis not present

## 2016-05-16 DIAGNOSIS — R5383 Other fatigue: Secondary | ICD-10-CM

## 2016-05-16 DIAGNOSIS — G8929 Other chronic pain: Secondary | ICD-10-CM

## 2016-05-16 LAB — COMPLETE METABOLIC PANEL WITH GFR
ALT: 14 U/L (ref 9–46)
AST: 15 U/L (ref 10–40)
Albumin: 3.9 g/dL (ref 3.6–5.1)
Alkaline Phosphatase: 77 U/L (ref 40–115)
BILIRUBIN TOTAL: 0.2 mg/dL (ref 0.2–1.2)
BUN: 20 mg/dL (ref 7–25)
CHLORIDE: 107 mmol/L (ref 98–110)
CO2: 22 mmol/L (ref 20–31)
CREATININE: 0.98 mg/dL (ref 0.60–1.35)
Calcium: 9.2 mg/dL (ref 8.6–10.3)
GFR, Est Non African American: >89 mL/min (ref >=60)
GLUCOSE: 100 mg/dL — AB (ref 65–99)
Potassium: 4.4 mmol/L (ref 3.5–5.3)
SODIUM: 140 mmol/L (ref 135–146)
TOTAL PROTEIN: 6.9 g/dL (ref 6.1–8.1)

## 2016-05-16 LAB — CBC
HCT: 41.9 % (ref 38.5–50.0)
Hemoglobin: 13.3 g/dL (ref 13.2–17.1)
MCH: 28.9 pg (ref 27.0–33.0)
MCHC: 31.7 g/dL — ABNORMAL LOW (ref 32.0–36.0)
MCV: 90.9 fL (ref 80.0–100.0)
MPV: 9.6 fL (ref 7.5–12.5)
PLATELETS: 395 10*3/uL (ref 140–400)
RBC: 4.61 MIL/uL (ref 4.20–5.80)
RDW: 14.7 % (ref 11.0–15.0)
WBC: 7 10*3/uL (ref 3.8–10.8)

## 2016-05-16 LAB — POCT SEDIMENTATION RATE: POCT SED RATE: 18 mm/hr (ref 0–22)

## 2016-05-16 MED ORDER — OXYCODONE-ACETAMINOPHEN 5-325 MG PO TABS: 1.0000 | ORAL_TABLET | Freq: Three times a day (TID) | ORAL | 0 refills | Status: DC | PRN

## 2016-05-16 NOTE — Progress Notes (Signed)
   HPI  CC: Abdominal pain and fatigue Patient is here for follow-up on his abdominal pain. Pain is significantly worse and she now has a fairly persistent lesion in his midline abdominal scar. Patient has been followed by general surgery and reports that his surgeon plans to operate on this area "soon" but no date has been set. His abdominal pain is significantly worse from his baseline today. No radiation of pain. No fevers, no chills.  Fatigue: Patient courses significantly worsening fatigue. He states that it is hard for him to get out of bed in the morning. He feels worn out and occasionally has dizziness. He reluctantly endorsed occasional falls and confusion. He states that he frequently gets lightheaded when he stands up quickly. He also states that there was a time when he was on the porch with a friend when his son passed by. He has his son why he was home from school when the friend reminded him that it was 5:30 in the afternoon. According to the patient he was so convinced that he had just dropped his son off at school that he got into an argument with this friend. He he is concerned that this is been happening more frequently over the past 1-2 months. Patient also endorses a loss of appetite.   Review of Systems   See HPI for ROS. All other systems reviewed and are negative.  CC, SH/smoking status, and VS noted  Objective: BP (!) 154/109 (BP Location: Left Arm, Patient Position: Sitting, Cuff Size: Normal)   Pulse (!) 113   Temp 97.6 F (36.4 C) (Oral)   Ht '5\' 10"'$  (1.778 m)   Wt 166 lb 6.4 oz (75.5 kg)   BMI 23.88 kg/m  Gen: NAD, alert, cooperative, and pleasant. HEENT: NCAT, EOMI, PERRL, pallor noted in his face but not conjunctiva. CV: RRR, no murmur Resp: CTAB, no wheezes, non-labored Abd: Extensive surgical scarring noted. Midline surgical scar with central 3 mm ulceration with surrounding erythema. Clear serous fluid appears to be draining from the site. Tenderness noted  at the site itself and to the right of the lesion. Ext: No edema, warm Neuro: Alert and oriented, Speech clear but occasionally has issues with word finding, No gross deficits, DTRs intact, no pronator drift.  Assessment and plan:  Abdominal pain, chronic, diffuse Worse: Patient having worsening abdominal pain. Plans to eventually have surgical intervention for this. - Refilled oxycodone - Encourage continuing antibiotic course  Other fatigue Patient is having worsening fatigue with occasional confusion. During our visit today patient was having issues with word finding and had a pallorous look to his skin. Obvious concern for CNS dysfunction, metabolic dysfunction, or anemia. 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 - Obtaining CBC, CMP, CRP, and ESR - I've asked patient to follow-up in one week.   Orders Placed This Encounter  Procedures  . CBC  . COMPLETE METABOLIC PANEL WITH GFR  . C-reactive protein  . POCT SEDIMENTATION RATE    Meds ordered this encounter  Medications  . oxyCODONE-acetaminophen (ROXICET) 5-325 MG tablet    Sig: Take 1 tablet by mouth every 8 (eight) hours as needed for severe pain.    Dispense:  60 tablet    Refill:  0     Elberta Leatherwood, MD,MS,  PGY3 05/16/2016 7:15 PM

## 2016-05-16 NOTE — Assessment & Plan Note (Addendum)
Patient is having worsening fatigue with occasional confusion. During our visit today patient was having issues with word finding and had a pallorous look to his skin. Obvious concern for CNS dysfunction, metabolic dysfunction, or anemia. 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 - Obtaining CBC, CMP, CRP, and ESR - I've asked patient to follow-up in one week. 

## 2016-05-16 NOTE — Assessment & Plan Note (Signed)
Worse: Patient having worsening abdominal pain. Plans to eventually have surgical intervention for this. - Refilled oxycodone - Encourage continuing antibiotic course

## 2016-05-16 NOTE — Patient Instructions (Signed)
It was a pleasure seeing you today in our clinic. Today we discussed your pain. Here is the treatment plan we have discussed and agreed upon together:   - I would like to see you back in one week.

## 2016-05-17 LAB — C-REACTIVE PROTEIN: CRP: 5.7 mg/L (ref <8.0)

## 2016-05-18 ENCOUNTER — Other Ambulatory Visit: Payer: Self-pay | Admitting: Internal Medicine

## 2016-05-19 NOTE — Telephone Encounter (Signed)
**   After Hours Line Emergency Call **  Patient called asking if Dr. Alease Frame had left a note in the computer that he should be personally paged for patient's phone calls this weekend. I told him that I did not see mention in Dr. Eyvonne Mechanic most recent office note from 10/26 regarding this.   Patient then stated he was having difficulty getting his Oxycodone. He said he would call the clinic first thing in the morning. I agreed that this was the best plan. Forwarding note to PCP.   Phill Myron, D.O. 05/19/2016, 5:25 PM PGY-2, [place]

## 2016-05-21 ENCOUNTER — Telehealth: Payer: Self-pay | Admitting: Psychology

## 2016-05-21 NOTE — Telephone Encounter (Signed)
Timothy [name] called to let me know that he is running out of some of his medications.  He has:  Three refills left on the Buspar.  No refills on the diazepam.  Has 8 pills left (four days).  Just got a refill on the Latuda.  Might not have any left after that.  According to the records, Dr. Alease Frame refilled that yesterday so Timothy [name] should be good for two months.    Scheduled an appointment for 11/22 at 11:00 for [facility].  Will discuss with Dr. Tammi Klippel about refilling the Diazepam.  Per our records, he is due for a refill.

## 2016-05-24 ENCOUNTER — Ambulatory Visit (INDEPENDENT_AMBULATORY_CARE_PROVIDER_SITE_OTHER): Payer: Medicaid Other | Admitting: Family Medicine

## 2016-05-24 ENCOUNTER — Encounter: Payer: Self-pay | Admitting: Family Medicine

## 2016-05-24 DIAGNOSIS — R1011 Right upper quadrant pain: Secondary | ICD-10-CM

## 2016-05-24 DIAGNOSIS — G8929 Other chronic pain: Secondary | ICD-10-CM

## 2016-05-24 MED ORDER — OXYCODONE-ACETAMINOPHEN 10-325 MG PO TABS: 1.0000 | ORAL_TABLET | Freq: Three times a day (TID) | ORAL | 0 refills | Status: DC | PRN

## 2016-05-24 NOTE — Progress Notes (Signed)
   HPI  CC: Abdominal pain Patient is here with complaints of continued abdominal pain. He states that he has seen his general surgeon and is scheduled for a abdominal wall surgery on 06/03/16. He continues to endorse significant pain at the midline of his abdomen. The previous wound he had had at the last visit is still present. He has been using a Q-tip and hydrogen peroxide to help clean the wound.  The specimens were time today discussing plans for the upcoming surgery. I will be managing his pain control. Patient and I both decided upon a relatively strict regimen in which will follow-up with me regularly. Patient stated his desire to avoid any unnecessary escalations in pain medication as he has had issues with this in the past.  ROS: He denies any recent fever, chills, nausea, vomiting, diarrhea, constipation, lightheadedness, dizziness, weakness, numbness, or paresthesias.  CC, SH/smoking status, and VS noted  Objective: BP 136/81   Pulse 96   Temp 97.8 F (36.6 C) (Oral)   Ht 5\' 10"  (1.778 m)   Wt 166 lb 9.6 oz (75.6 kg)   BMI 23.90 kg/m  Gen: NAD, alert, cooperative, and pleasant. HEENT: NCAT, EOMI, PERRL CV: RRR, no murmur Resp: CTAB, mild wheezing bilat, non-labored Abd: SNTND, extensive surgical scarring noted along the abdomen. Midline ulceration noted inferior to the umbilicus. Tenderness at the site of ulceration extending laterally to the right approximately 6 cm. Ext: No edema, warm  Assessment and plan:  Abdominal pain, chronic, diffuse Patient is here to discuss pain regimen after abdominal surgery. According to the patient the plan is to remove previous permanent suturing and mesh, then allow the wound to remain open and heal from the bottom up. This will obviously be an uncomfortable process and patient is worried about slipping back into dependence on his pain medication. We discussed a plan and have agreed on the following: - Patient has approximately 30  tablets left of the previous oxycodone 5/325 mg from the previous prescription.   - He is to use this until the surgery. - Patient was provided a prescription for oxycodone 10/325 mg tablets, #21. He is to fill this prescription the day before surgery and use these tablets up to 3 times a day as needed for pain control for the week following the procedure. - Patient is to follow-up with me exactly 1 week after the procedure and we will discuss further pain control at that time. - Patient was very pleased with this plan and happily agreed to it.   Meds ordered this encounter  Medications  . oxyCODONE-acetaminophen (PERCOCET) 10-325 MG tablet    Sig: Take 1 tablet by mouth every 8 (eight) hours as needed for pain.    Dispense:  21 tablet    Refill:  0     Elberta Leatherwood, MD,MS,  PGY3 05/24/2016 2:41 PM

## 2016-05-24 NOTE — Assessment & Plan Note (Signed)
Patient is here to discuss pain regimen after abdominal surgery. According to the patient the plan is to remove previous permanent suturing and mesh, then allow the wound to remain open and heal from the bottom up. This will obviously be an uncomfortable process and patient is worried about slipping back into dependence on his pain medication. We discussed a plan and have agreed on the following: - Patient has approximately 30 tablets left of the previous oxycodone 5/325 mg from the previous prescription.   - He is to use this until the surgery. - Patient was provided a prescription for oxycodone 10/325 mg tablets, #21. He is to fill this prescription the day before surgery and use these tablets up to 3 times a day as needed for pain control for the week following the procedure. - Patient is to follow-up with me exactly 1 week after the procedure and we will discuss further pain control at that time. - Patient was very pleased with this plan and happily agreed to it.

## 2016-05-24 NOTE — Patient Instructions (Signed)
It was a pleasure seeing you today in our clinic. Today we discussed your abdominal procedure. Here is the treatment plan we have discussed and agreed upon together:   - I provided you a prescription for oxycodone. Do not fill this until 06/01/2016 or after. - This prescription is to last you until 7 days after the procedure. At that time I talked to see you back and reevaluate how we are to control your pain. - Good luck with surgery, I will be here to help you through the recovery process.

## 2016-05-27 ENCOUNTER — Ambulatory Visit: Payer: Self-pay | Admitting: General Surgery

## 2016-05-28 ENCOUNTER — Encounter (HOSPITAL_BASED_OUTPATIENT_CLINIC_OR_DEPARTMENT_OTHER): Payer: Self-pay | Admitting: *Deleted

## 2016-06-03 ENCOUNTER — Ambulatory Visit (HOSPITAL_BASED_OUTPATIENT_CLINIC_OR_DEPARTMENT_OTHER): Payer: Medicaid Other | Admitting: Anesthesiology

## 2016-06-03 ENCOUNTER — Encounter (HOSPITAL_BASED_OUTPATIENT_CLINIC_OR_DEPARTMENT_OTHER)
Admission: RE | Disposition: A | Discharge status: Home / Self Care | Payer: Self-pay | Source: Ambulatory Visit | Attending: General Surgery

## 2016-06-03 ENCOUNTER — Ambulatory Visit (HOSPITAL_BASED_OUTPATIENT_CLINIC_OR_DEPARTMENT_OTHER)
Admission: RE | Admit: 2016-06-03 | Discharge: 2016-06-03 | Disposition: A | Discharge status: Home / Self Care | Payer: Medicaid Other | Source: Ambulatory Visit | Attending: General Surgery | Admitting: General Surgery

## 2016-06-03 ENCOUNTER — Encounter (HOSPITAL_BASED_OUTPATIENT_CLINIC_OR_DEPARTMENT_OTHER): Payer: Self-pay

## 2016-06-03 DIAGNOSIS — J439 Emphysema, unspecified: Secondary | ICD-10-CM | POA: Insufficient documentation

## 2016-06-03 DIAGNOSIS — Z7951 Long term (current) use of inhaled steroids: Secondary | ICD-10-CM | POA: Diagnosis not present

## 2016-06-03 DIAGNOSIS — I10 Essential (primary) hypertension: Secondary | ICD-10-CM | POA: Insufficient documentation

## 2016-06-03 DIAGNOSIS — T859XXA Unspecified complication of internal prosthetic device, implant and graft, initial encounter: Secondary | ICD-10-CM | POA: Diagnosis not present

## 2016-06-03 DIAGNOSIS — Z79891 Long term (current) use of opiate analgesic: Secondary | ICD-10-CM | POA: Insufficient documentation

## 2016-06-03 DIAGNOSIS — Y838 Other surgical procedures as the cause of abnormal reaction of the patient, or of later complication, without mention of misadventure at the time of the procedure: Secondary | ICD-10-CM | POA: Diagnosis not present

## 2016-06-03 DIAGNOSIS — R109 Unspecified abdominal pain: Secondary | ICD-10-CM | POA: Diagnosis present

## 2016-06-03 DIAGNOSIS — F17200 Nicotine dependence, unspecified, uncomplicated: Secondary | ICD-10-CM | POA: Insufficient documentation

## 2016-06-03 DIAGNOSIS — F329 Major depressive disorder, single episode, unspecified: Secondary | ICD-10-CM | POA: Insufficient documentation

## 2016-06-03 DIAGNOSIS — Z79899 Other long term (current) drug therapy: Secondary | ICD-10-CM | POA: Insufficient documentation

## 2016-06-03 DIAGNOSIS — M1990 Unspecified osteoarthritis, unspecified site: Secondary | ICD-10-CM | POA: Insufficient documentation

## 2016-06-03 HISTORY — PX: WOUND DEBRIDEMENT: SHX247

## 2016-06-03 HISTORY — DX: Other chronic pain: G89.29

## 2016-06-03 HISTORY — DX: Nicotine dependence, unspecified, uncomplicated: F17.200

## 2016-06-03 SURGERY — DEBRIDEMENT, WOUND, ABDOMEN
Anesthesia: General | Site: Abdomen

## 2016-06-03 MED ORDER — MIDAZOLAM HCL 2 MG/2ML IJ SOLN
INTRAMUSCULAR | Status: AC
  Filled 2016-06-03: qty 2

## 2016-06-03 MED ORDER — LACTATED RINGERS IV SOLN
INTRAVENOUS | Status: DC
  Administered 2016-06-03 (×2): via INTRAVENOUS

## 2016-06-03 MED ORDER — ONDANSETRON HCL 4 MG/2ML IJ SOLN
INTRAMUSCULAR | Status: DC | PRN
  Administered 2016-06-03: 4 mg via INTRAVENOUS

## 2016-06-03 MED ORDER — BUPIVACAINE HCL (PF) 0.25 % IJ SOLN
INTRAMUSCULAR | Status: DC | PRN
  Administered 2016-06-03: 8 mL

## 2016-06-03 MED ORDER — OXYCODONE HCL 5 MG PO TABS: 5.0000 mg | ORAL_TABLET | Freq: Four times a day (QID) | ORAL | 0 refills | Status: DC | PRN

## 2016-06-03 MED ORDER — CEFAZOLIN SODIUM-DEXTROSE 2-4 GM/100ML-% IV SOLN
2.0000 g | INTRAVENOUS | Status: AC
  Administered 2016-06-03: 2 g via INTRAVENOUS

## 2016-06-03 MED ORDER — PROPOFOL 10 MG/ML IV BOLUS
INTRAVENOUS | Status: DC | PRN
  Administered 2016-06-03: 200 mg via INTRAVENOUS

## 2016-06-03 MED ORDER — SCOPOLAMINE 1 MG/3DAYS TD PT72
1.0000 | MEDICATED_PATCH | Freq: Once | TRANSDERMAL | Status: AC | PRN
Start: 2016-06-03 — End: 2016-06-03
  Administered 2016-06-03: 1 via TRANSDERMAL

## 2016-06-03 MED ORDER — CHLORHEXIDINE GLUCONATE CLOTH 2 % EX PADS: 6.0000 | MEDICATED_PAD | Freq: Once | CUTANEOUS | Status: DC

## 2016-06-03 MED ORDER — BUPIVACAINE HCL (PF) 0.25 % IJ SOLN
INTRAMUSCULAR | Status: AC
  Filled 2016-06-03: qty 30

## 2016-06-03 MED ORDER — FENTANYL CITRATE (PF) 100 MCG/2ML IJ SOLN
INTRAMUSCULAR | Status: AC
  Filled 2016-06-03: qty 2

## 2016-06-03 MED ORDER — FENTANYL CITRATE (PF) 100 MCG/2ML IJ SOLN: 25.0000 ug | INTRAMUSCULAR | Status: DC | PRN

## 2016-06-03 MED ORDER — MIDAZOLAM HCL 2 MG/2ML IJ SOLN
1.0000 mg | INTRAMUSCULAR | Status: DC | PRN
  Administered 2016-06-03: 2 mg via INTRAVENOUS

## 2016-06-03 MED ORDER — LIDOCAINE 2% (20 MG/ML) 5 ML SYRINGE
INTRAMUSCULAR | Status: DC | PRN
  Administered 2016-06-03: 80 mg via INTRAVENOUS

## 2016-06-03 MED ORDER — FENTANYL CITRATE (PF) 100 MCG/2ML IJ SOLN
50.0000 ug | INTRAMUSCULAR | Status: AC | PRN
  Administered 2016-06-03: 50 ug via INTRAVENOUS
  Administered 2016-06-03: 100 ug via INTRAVENOUS
  Administered 2016-06-03: 50 ug via INTRAVENOUS

## 2016-06-03 MED ORDER — DEXAMETHASONE SODIUM PHOSPHATE 4 MG/ML IJ SOLN
INTRAMUSCULAR | Status: DC | PRN
  Administered 2016-06-03: 10 mg via INTRAVENOUS

## 2016-06-03 MED ORDER — PROPOFOL 10 MG/ML IV BOLUS
INTRAVENOUS | Status: AC
  Filled 2016-06-03: qty 20

## 2016-06-03 SURGICAL SUPPLY — 45 items
BLADE SURG 10 STRL SS (BLADE) ×3 IMPLANT
BLADE SURG 15 STRL LF DISP TIS (BLADE) ×1 IMPLANT
BLADE SURG 15 STRL SS (BLADE) ×2
CANISTER SUCT 1200ML W/VALVE (MISCELLANEOUS) IMPLANT
CHLORAPREP W/TINT 26ML (MISCELLANEOUS) ×3 IMPLANT
COVER BACK TABLE 60X90IN (DRAPES) ×3 IMPLANT
COVER MAYO STAND STRL (DRAPES) ×3 IMPLANT
DECANTER SPIKE VIAL GLASS SM (MISCELLANEOUS) IMPLANT
DERMABOND ADVANCED (GAUZE/BANDAGES/DRESSINGS) ×2
DERMABOND ADVANCED .7 DNX12 (GAUZE/BANDAGES/DRESSINGS) ×1 IMPLANT
DRAPE LAPAROTOMY 100X72 PEDS (DRAPES) ×3 IMPLANT
DRAPE UTILITY XL STRL (DRAPES) ×3 IMPLANT
ELECT COATED BLADE 2.86 ST (ELECTRODE) ×3 IMPLANT
ELECT REM PT RETURN 9FT ADLT (ELECTROSURGICAL) ×3
ELECTRODE REM PT RTRN 9FT ADLT (ELECTROSURGICAL) ×1 IMPLANT
GAUZE SPONGE 4X4 16PLY XRAY LF (GAUZE/BANDAGES/DRESSINGS) IMPLANT
GLOVE BIO SURGEON STRL SZ7.5 (GLOVE) ×3 IMPLANT
GOWN STRL REUS W/ TWL LRG LVL3 (GOWN DISPOSABLE) ×2 IMPLANT
GOWN STRL REUS W/TWL LRG LVL3 (GOWN DISPOSABLE) ×4
NEEDLE HYPO 25X1 1.5 SAFETY (NEEDLE) IMPLANT
NS IRRIG 1000ML POUR BTL (IV SOLUTION) ×3 IMPLANT
PACK BASIN DAY SURGERY FS (CUSTOM PROCEDURE TRAY) ×3 IMPLANT
PENCIL BUTTON HOLSTER BLD 10FT (ELECTRODE) ×3 IMPLANT
SLEEVE SCD COMPRESS KNEE MED (MISCELLANEOUS) ×3 IMPLANT
SPONGE LAP 18X18 X RAY DECT (DISPOSABLE) ×3 IMPLANT
SUT CHROMIC 3 0 SH 27 (SUTURE) IMPLANT
SUT ETHILON 3 0 PS 1 (SUTURE) IMPLANT
SUT MON AB 4-0 PC3 18 (SUTURE) IMPLANT
SUT PROLENE 3 0 PS 2 (SUTURE) IMPLANT
SUT SILK 2 0 FS (SUTURE) IMPLANT
SUT VIC AB 3-0 54X BRD REEL (SUTURE) IMPLANT
SUT VIC AB 3-0 BRD 54 (SUTURE)
SUT VIC AB 3-0 FS2 27 (SUTURE) IMPLANT
SUT VIC AB 3-0 SH 27 (SUTURE)
SUT VIC AB 3-0 SH 27X BRD (SUTURE) IMPLANT
SUT VIC AB 4-0 RB1 27 (SUTURE)
SUT VIC AB 4-0 RB1 27X BRD (SUTURE) IMPLANT
SUT VICRYL 4-0 PS2 18IN ABS (SUTURE) IMPLANT
SUT VICRYL AB 3 0 TIES (SUTURE) IMPLANT
SYR CONTROL 10ML LL (SYRINGE) IMPLANT
TOWEL OR 17X24 6PK STRL BLUE (TOWEL DISPOSABLE) ×6 IMPLANT
TOWEL OR NON WOVEN STRL DISP B (DISPOSABLE) ×3 IMPLANT
TUBE CONNECTING 20'X1/4 (TUBING)
TUBE CONNECTING 20X1/4 (TUBING) IMPLANT
YANKAUER SUCT BULB TIP NO VENT (SUCTIONS) IMPLANT

## 2016-06-03 NOTE — Anesthesia Procedure Notes (Signed)
Procedure Name: LMA Insertion Date/Time: 06/03/2016 2:57 PM Performed by: Maryella Shivers Pre-anesthesia Checklist: Patient identified, Emergency Drugs available, Suction available and Patient being monitored Patient Re-evaluated:Patient Re-evaluated prior to inductionOxygen Delivery Method: Circle system utilized Preoxygenation: Pre-oxygenation with 100% oxygen Intubation Type: IV induction Ventilation: Mask ventilation without difficulty LMA: LMA inserted LMA Size: 5.0 Number of attempts: 1 Airway Equipment and Method: Bite block Placement Confirmation: positive ETCO2 Tube secured with: Tape Dental Injury: Teeth and Oropharynx as per pre-operative assessment

## 2016-06-03 NOTE — Anesthesia Postprocedure Evaluation (Signed)
Anesthesia Post Note  Patient: Timothy [name]  Procedure(s) Performed: Procedure(s) (LRB): ABDOMINAL WOUND EXPLORATION AND STITCH REMOVAL (N/A)  Patient location during evaluation: PACU Anesthesia Type: General Level of consciousness: awake Pain management: pain level controlled Vital Signs Assessment: post-procedure vital signs reviewed and stable Respiratory status: spontaneous breathing Cardiovascular status: stable Anesthetic complications: no    Last Vitals:  Vitals:   06/03/16 1545 06/03/16 1600  BP: 103/81 127/74  Pulse: 85 93  Resp: 15 16  Temp:  36.8 C    Last Pain:  Vitals:   06/03/16 1600  PainSc: 0-No pain                 EDWARDS,Glender [place]

## 2016-06-03 NOTE — Transfer of Care (Signed)
Immediate Anesthesia Transfer of Care Note  Patient: Timothy [name]  Procedure(s) Performed: Procedure(s) with comments: ABDOMINAL WOUND EXPLORATION AND STITCH REMOVAL (N/A) - ABDOMINAL WOUND EXPLORATION AND STITCH REMOVAL  Patient Location: PACU  Anesthesia Type:General  Level of Consciousness: sedated  Airway & Oxygen Therapy: Patient Spontanous Breathing and Patient connected to face mask oxygen  Post-op Assessment: Report given to RN and Post -op Vital signs reviewed and stable  Post vital signs: Reviewed and stable  Last Vitals:  Vitals:   06/03/16 1523 06/03/16 1524  BP: 111/66   Pulse: 75 71  Resp:  12  Temp:  36.5 C    Last Pain:  Vitals:   06/03/16 1524  PainSc: Asleep      Patients Stated Pain Goal: 4 ([id number])  Complications: No apparent anesthesia complications

## 2016-06-03 NOTE — Interval H&P Note (Signed)
History and Physical Interval Note:  06/03/2016 2:33 PM  Timothy [name]  has presented today for surgery, with the diagnosis of recurrent abdominal wall infection  The various methods of treatment have been discussed with the patient and family. After consideration of risks, benefits and other options for treatment, the patient has consented to  Procedure(s): ABDOMINAL WOUND EXPLORATION AND STITCH REMOVAL (N/A) as a surgical intervention .  The patient's history has been reviewed, patient examined, no change in status, stable for surgery.  I have reviewed the patient's chart and labs.  Questions were answered to the patient's satisfaction.     TOTH III,PAUL S

## 2016-06-03 NOTE — Discharge Instructions (Signed)

## 2016-06-03 NOTE — Op Note (Signed)
06/03/2016  3:14 PM  PATIENT:  Timothy [name]  50 y.o. male  PRE-OPERATIVE DIAGNOSIS:  recurrent abdominal wall infection  POST-OPERATIVE DIAGNOSIS:  recurrent abdominal wall infection  PROCEDURE:  Procedure(s) with comments: ABDOMINAL WOUND EXPLORATION AND STITCH REMOVAL   SURGEON:  Surgeon(s) and Role:    * Jovita Kussmaul, MD - Primary  PHYSICIAN ASSISTANT:   ASSISTANTS: none   ANESTHESIA:   local and general  EBL:  Total I/O In: 500 [I.V.:500] Out: -   BLOOD ADMINISTERED:none  DRAINS: none   LOCAL MEDICATIONS USED:  MARCAINE     SPECIMEN:  No Specimen  DISPOSITION OF SPECIMEN:  N/A  COUNTS:  YES  TOURNIQUET:  * No tourniquets in log *  DICTATION: .Dragon Dictation   After informed consent was obtained the patient was brought to the operating room and placed in the supine position on the operating room table. After adequate induction of general anesthesia, the patient's abdomen was prepped with Betadine and draped in usual sterile manner. An appropriate timeout was performed. There was a small opening along the midline previous scar. There was a small amount of drainage from this area. This area was opened sharply with a 15 blade knife. The wound was then probed bluntly with a hemostat. The wound was opened a distance of approximately 2-3 cm with the Bovie electrocautery. The base of the wound was examined and a Novofil stitch was identified. This Novafil stitch was removed. There was also a small portion of nonadherent mesh that was identified in the same area. This was trimmed away very carefully with Metzenbaum scissors. Once this was accomplished the wound appeared clean. Some of the granulation tissue along the sidewalls was fulgurated with cautery. The wound was infiltrated with quarter percent Marcaine. The wound was then packed with a 4 x 4 gauze and sterile dressings were applied. The patient tolerated the procedure well. At the end of the case all needle sponge and  instrument counts were correct. The patient was then awakened and taken to recovery in stable condition.  PLAN OF CARE: Discharge to home after PACU  PATIENT DISPOSITION:  PACU - hemodynamically stable.   Delay start of Pharmacological VTE agent (>24hrs) due to surgical blood loss or risk of bleeding: not applicable

## 2016-06-03 NOTE — Anesthesia Preprocedure Evaluation (Signed)
Anesthesia Evaluation  Patient identified by MRN, date of birth, ID band Patient awake    Reviewed: Allergy & Precautions, NPO status , Patient's Chart, lab work & pertinent test results  Airway Mallampati: II  TM Distance: >3 FB     Dental   Pulmonary COPD,    breath sounds clear to auscultation       Cardiovascular hypertension,  Rhythm:Regular Rate:Normal     Neuro/Psych    GI/Hepatic Neg liver ROS, GERD  ,  Endo/Other  negative endocrine ROS  Renal/GU negative Renal ROS     Musculoskeletal  (+) Arthritis ,   Abdominal   Peds  Hematology   Anesthesia Other Findings   Reproductive/Obstetrics                             Anesthesia Physical Anesthesia Plan  ASA: III  Anesthesia Plan: General   Post-op Pain Management:    Induction: Intravenous  Airway Management Planned: Oral ETT  Additional Equipment:   Intra-op Plan:   Post-operative Plan: Extubation in OR  Informed Consent: I have reviewed the patients History and Physical, chart, labs and discussed the procedure including the risks, benefits and alternatives for the proposed anesthesia with the patient or authorized representative who has indicated his/her understanding and acceptance.   Dental advisory given  Plan Discussed with: CRNA and Anesthesiologist  Anesthesia Plan Comments:         Anesthesia Quick Evaluation

## 2016-06-03 NOTE — H&P (Signed)
Timothy [name]  Location: [facility] Patient #: [id number] DOB: [birth year] Single / Language: Timothy [name] / Race: White Male   History of Present Illness  Patient words: wound check.  The patient is a 50 year old male who presents for a follow-up for Abdominal pain. The patient is a 50 year old white male who is about 3 years status post ventral hernia repair with mesh after multiple abdominal explorations for perforated diverticulitis. He has developed a small recurring area of infection along the midportion of his midline scar. I saw him just 1 week ago and the area looked good. On Friday it opened up and started draining again. He was placed on Keflex. He denies any fevers or chills. He has had a small amount of drainage from the area.   Problem List/Past Medical  ABDOMINAL PAIN, LEFT LATERAL (R10.9) WOUND, OPEN, ABDOMINAL WALL, ANTERIOR, INITIAL ENCOUNTER (S31.109A)  Other Problems  Anxiety Disorder Chronic Obstructive Lung Disease Depression Diverticulosis Emphysema Of Lung Gastroesophageal Reflux Disease High blood pressure Ventral Hernia Repair  Past Surgical History Colon Polyp Removal - Colonoscopy Colon Removal - Partial Oral Surgery Resection of Small Bowel Resection of Stomach Ventral / Umbilical Hernia Surgery Bilateral.  Diagnostic Studies History  Colonoscopy 1-5 years ago  Allergies  Ambien *HYPNOTICS/SEDATIVES/SLEEP DISORDER AGENTS* Darvocet-N 100 *ANALGESICS - OPIOID* Morphine Sulfate (PF) *ANALGESICS - OPIOID* Bactroban *DERMATOLOGICALS*  Medication History  Losartan Potassium (100MG  Tablet, Oral) Active. Latuda (40MG  Tablet, Oral) Active. BusPIRone HCl (15MG  Tablet, Oral) Active. Lisinopril (40MG  Tablet, Oral) Active. Deltasone (50MG  Tablet, Oral) Active. Spiriva Respimat (2.5MCG/ACT Aerosol Soln, Inhalation) Active. Flexeril (10MG  Tablet, Oral) Active. Oxycodone-Acetaminophen (5-325MG  Tablet, Oral)  Active. Albuterol Sulfate ER (8MG  Tablet ER 12HR, Oral) Active. Valium (5MG  Tablet, Oral) Active. Medications Reconciled  Social History  Alcohol use Occasional alcohol use. Caffeine use Carbonated beverages, Coffee. Illicit drug use Remotely quit drug use. Tobacco use Current every day smoker.  Family History  Alcohol Abuse Father. Arthritis Mother. Cancer Father. Depression Mother. Heart Disease Father. Hypertension Father. Respiratory Condition Father.    Review of Systems General Present- Fatigue. Not Present- Appetite Loss, Chills, Fever, Night Sweats, Weight Gain and Weight Loss. Skin Not Present- Change in Wart/Mole, Dryness, Hives, Jaundice, New Lesions, Non-Healing Wounds, Rash and Ulcer. HEENT Present- Wears glasses/contact lenses. Not Present- Earache, Hearing Loss, Hoarseness, Nose Bleed, Oral Ulcers, Ringing in the Ears, Seasonal Allergies, Sinus Pain, Sore Throat, Visual Disturbances and Yellow Eyes. Respiratory Present- Chronic Cough, Difficulty Breathing, Snoring and Wheezing. Not Present- Bloody sputum. Breast Not Present- Breast Mass, Breast Pain, Nipple Discharge and Skin Changes. Cardiovascular Present- Shortness of Breath. Not Present- Chest Pain, Difficulty Breathing Lying Down, Leg Cramps, Palpitations, Rapid Heart Rate and Swelling of Extremities. Gastrointestinal Present- Abdominal Pain. Not Present- Bloating, Bloody Stool, Change in Bowel Habits, Chronic diarrhea, Constipation, Difficulty Swallowing, Excessive gas, Gets full quickly at meals, Hemorrhoids, Indigestion, Nausea, Rectal Pain and Vomiting. Male Genitourinary Not Present- Blood in Urine, Change in Urinary Stream, Frequency, Impotence, Nocturia, Painful Urination, Urgency and Urine Leakage.  Vitals  Weight: 166.13 lb Height: 70in Body Surface Area: 1.93 m Body Mass Index: 23.84 kg/m  BP: 132/84 (Sitting, Left Arm, Standard)       Physical Exam  General Mental  Status-Alert. General Appearance-Consistent with stated age. Hydration-Well hydrated. Voice-Normal.  Head and Neck Head-normocephalic, atraumatic with no lesions or palpable masses. Trachea-midline. Thyroid Gland Characteristics - normal size and consistency.  Eye Eyeball - Bilateral-Extraocular movements intact. Sclera/Conjunctiva - Bilateral-No scleral icterus.  Chest and Lung Exam Chest and  lung exam reveals -quiet, even and easy respiratory effort with no use of accessory muscles and on auscultation, normal breath sounds, no adventitious sounds and normal vocal resonance. Inspection Chest Wall - Normal. Back - normal.  Cardiovascular Cardiovascular examination reveals -normal heart sounds, regular rate and rhythm with no murmurs and normal pedal pulses bilaterally.  Abdomen Note: The abdomen is soft and nontender. The midline incision is completely healed. There is a small opening along the lower midline incision with some mucousy drainage. There is no cellulitis. The cavity is very shallow and self-limited.   Neurologic Neurologic evaluation reveals -alert and oriented x 3 with no impairment of recent or remote memory. Mental Status-Normal.  Musculoskeletal Normal Exam - Left-Upper Extremity Strength Normal and Lower Extremity Strength Normal. Normal Exam - Right-Upper Extremity Strength Normal and Lower Extremity Strength Normal.  Lymphatic Head & Neck  General Head & Neck Lymphatics: Bilateral - Description - Normal. Axillary  General Axillary Region: Bilateral - Description - Normal. Tenderness - Non Tender. Femoral & Inguinal  Generalized Femoral & Inguinal Lymphatics: Bilateral - Description - Normal. Tenderness - Non Tender.    Assessment & Plan  RECURRENT ABDOMINAL WOUND ABSCESS, SUBSEQUENT ENCOUNTER (T81.4XXD) Impression: The patient is about 3 years status post ventral hernia repair with mesh after multiple operations  for perforated diverticulitis. He now has a small open wound along the midline incision that continues to recur. At this point I think we should locally explore the area and if there is a stitch causing the recurrent infection then we can remove that stitch. I have discussed with him in detail the risks and benefits of the operation to do this as well as some of the technical aspects and he understands and wishes to proceed    Signed by Luella Cook, MD

## 2016-06-04 ENCOUNTER — Encounter (HOSPITAL_BASED_OUTPATIENT_CLINIC_OR_DEPARTMENT_OTHER): Payer: Self-pay | Admitting: General Surgery

## 2016-06-10 ENCOUNTER — Encounter: Payer: Self-pay | Admitting: Family Medicine

## 2016-06-10 ENCOUNTER — Ambulatory Visit (INDEPENDENT_AMBULATORY_CARE_PROVIDER_SITE_OTHER): Payer: Medicaid Other | Admitting: Family Medicine

## 2016-06-10 DIAGNOSIS — R109 Unspecified abdominal pain: Secondary | ICD-10-CM

## 2016-06-10 MED ORDER — OXYCODONE-ACETAMINOPHEN 10-325 MG PO TABS: 1.0000 | ORAL_TABLET | Freq: Two times a day (BID) | ORAL | 0 refills | Status: DC | PRN

## 2016-06-10 NOTE — Assessment & Plan Note (Signed)
Subacute, worse: Patient is here with significant abdominal pain after undergoing surgical debridement and removal of a previous stitch and some mesh from previous surgeries. Wound was left open by surgery. I will take over pain management at this time. - Oxycodone 10/325 mg twice a day when necessary for pain. #60 - Patient and I both agreed that he is to do his best to take this medication only when his pain is severe. Over the next couple weeks he is to attempt to break one of these tablets in half and just take half a tablet. He and I both agreed that if this does not work and he is to call our office and we are to discuss a different plan for his pain control. Patient was very pleased with this plan. - Follow-up in 4-5 weeks

## 2016-06-10 NOTE — Progress Notes (Signed)
   HPI  CC: Abdominal pain Patient is here for follow-up on his abdominal pain. Last week he underwent surgery due to an infection his previous surgical site. The decision was made to leave this wound open and he is obviously having fairly significant discomfort due to this open wound. He denies any significant setbacks. His pain is fairly significant and debilitating. He denies any fever, chills, nausea, vomiting, diarrhea, purulent discharge, swelling, erythema, headaches.  Review of Systems    See HPI for ROS. All other systems reviewed and are negative.  CC, SH/smoking status, and VS noted  Objective: BP 128/81   Pulse (!) 102   Temp 97.7 F (36.5 C) (Oral)   Ht 5\' 10"  (1.778 m)   Wt 168 lb (76.2 kg)   SpO2 99%   BMI 24.11 kg/m  Gen:NAD, alert, cooperative, and pleasant. HEENT:NCAT, EOMI, PERRL CV:RRR, no murmur Resp:CTAB, mild wheezing bilat, non-labored Abd:S, ND, extensive surgical scarring noted along the abdomen. Midline surgical incision noted. Tenderness at this site. No evidence of induration, or erythema. Ext:No edema, warm  Assessment and plan:  Central abdominal pain Subacute, worse: Patient is here with significant abdominal pain after undergoing surgical debridement and removal of a previous stitch and some mesh from previous surgeries. Wound was left open by surgery. I will take over pain management at this time. - Oxycodone 10/325 mg twice a day when necessary for pain. #60 - Patient and I both agreed that he is to do his best to take this medication only when his pain is severe. Over the next couple weeks he is to attempt to break one of these tablets in half and just take half a tablet. He and I both agreed that if this does not work and he is to call our office and we are to discuss a different plan for his pain control. Patient was very pleased with this plan. - Follow-up in 4-5 weeks   Meds ordered this encounter  Medications  .  oxyCODONE-acetaminophen (PERCOCET) 10-325 MG tablet    Sig: Take 1 tablet by mouth 2 (two) times daily as needed for pain.    Dispense:  60 tablet    Refill:  0     Elberta Leatherwood, MD,MS,  PGY3 06/10/2016 11:01 AM

## 2016-06-10 NOTE — Patient Instructions (Signed)
It was a pleasure seeing you today in our clinic. Today we discussed your pain. Here is the treatment plan we have discussed and agreed upon together:   - As we agreed on in the office today I have provided you prescription for oxycodone 10/325 mg. Take 1 tablet twice a day as needed for pain. Once your pain begins to improve and you recover from the surgery, I would like for you to start splitting some of these in half and try taking only half a tablet twice a day as needed. Of course, if this does not adequately treat your pain, then you can always take that second half of the tablet. - I would like to see you back in 4-5 weeks.

## 2016-06-12 ENCOUNTER — Ambulatory Visit: Payer: Self-pay | Admitting: Psychology

## 2016-06-12 ENCOUNTER — Encounter: Payer: Self-pay | Admitting: Obstetrics and Gynecology

## 2016-06-12 NOTE — Progress Notes (Unsigned)
Family Medicine Emergency Line Telephone Note  Patient called after hours line stating that he is still waiting to get his pain medications. The pharmacy sent some papers to our clinic for "authorization" of his pain medications.   On behalf of the patient I called [facility] to get more detail. They said that Medicaid now requires a prior authorization for opioids that have more than a 30-day supply. The form was faxed to our clinic for PCP to fill out.   Will route note to provider to follow-up.   Luiz Blare, DO 06/12/2016, 6:52 PM PGY-3, [place]

## 2016-06-14 ENCOUNTER — Telehealth: Payer: Self-pay | Admitting: Student

## 2016-06-14 NOTE — Telephone Encounter (Signed)
After hours telephone call  Patient calls to report that he was prescribed narcotics by Dr Alease Frame on 06/10/16.  He had abdominal surgery on 11/13.  The pharmacy supposedly sent PA forms to [facility] (see telephone note from Dr Gerarda Fraction on 11/22).  Will forward to PCP for completion.  Virginia Crews, MD, MPH PGY-3,  [facility] 06/14/2016 4:02 PM

## 2016-06-17 ENCOUNTER — Telehealth: Payer: Self-pay | Admitting: Family Medicine

## 2016-06-17 NOTE — Telephone Encounter (Signed)
Pt needs prior authorization on pain medication. Please advise. Thanks! ep

## 2016-06-17 NOTE — Telephone Encounter (Signed)
Prior Authorization received from [place] for Oxycodone-Acetaminophen 10-325 mg. PA approved via [employer], valid dates 06/17/16-12/14/16. Approval [id number].   Derl Barrow, RN

## 2016-06-21 ENCOUNTER — Telehealth: Payer: Self-pay | Admitting: Psychology

## 2016-06-21 NOTE — Telephone Encounter (Signed)
Timothy [name] called thinking he had an upcoming appointment.  It was 11/22 and he missed it.  Had a surgery on the 13th and still recovering a bit.  We rescheduled for 12/20 at 10:30.    Needs a refill on his diazepam.  Records indicate he is due.  Discussed with Dr. Tammi Klippel who authorized the refill.  5 mg #60 with no refills.

## 2016-07-10 ENCOUNTER — Ambulatory Visit: Payer: Self-pay | Admitting: Psychology

## 2016-07-10 ENCOUNTER — Telehealth: Payer: Self-pay | Admitting: Psychology

## 2016-07-10 NOTE — Telephone Encounter (Signed)
Patient called at 12:49 today after missing his [place] appointment earlier today.  He left a VM.  He said he overslept.    Rescheduled for 2/7 at 9:00.

## 2016-07-17 ENCOUNTER — Telehealth: Payer: Self-pay | Admitting: Family Medicine

## 2016-07-17 NOTE — Telephone Encounter (Signed)
Pt called and would like to know if he can get a refill on his pain medication today. jw

## 2016-07-17 NOTE — Telephone Encounter (Signed)
Unfortunately I am seeing this with only 5 minutes left with the office being open. I checked his upcoming appointments and he is scheduled with me tomorrow. At that time I will refill his medications.

## 2016-07-18 ENCOUNTER — Encounter: Payer: Self-pay | Admitting: Family Medicine

## 2016-07-18 ENCOUNTER — Ambulatory Visit (INDEPENDENT_AMBULATORY_CARE_PROVIDER_SITE_OTHER): Payer: Medicaid Other | Admitting: Family Medicine

## 2016-07-18 ENCOUNTER — Ambulatory Visit: Payer: Self-pay | Admitting: Family Medicine

## 2016-07-18 VITALS — BP 132/70 | HR 109 | Temp 98.1°F | Ht 70.0 in | Wt 168.2 lb

## 2016-07-18 DIAGNOSIS — R109 Unspecified abdominal pain: Secondary | ICD-10-CM

## 2016-07-18 MED ORDER — OXYCODONE-ACETAMINOPHEN 10-325 MG PO TABS: 1.0000 | ORAL_TABLET | Freq: Four times a day (QID) | ORAL | 0 refills | Status: DC | PRN

## 2016-07-18 NOTE — Assessment & Plan Note (Signed)
Patient is here for follow-up on his abdominal pain. It appears as though he is having some acute worsening in his pain with new vomiting. Abdominal exam yielded tenderness at the surgical site with some extension to the RLQ. No rebound or guarding noted. Patient is having regular BMs. - Encourage adequate hydration and Brat diet as tolerated. - CT abdomen with contrast ordered. - Refilled pain medication - Patient has follow-up with surgeon in 6-7 days. - I will contact patient with CT results. - Strict return precautions discussed.

## 2016-07-18 NOTE — Patient Instructions (Signed)
It was a pleasure seeing you today in our clinic. Today we discussed your abdominal pain. Here is the treatment plan we have discussed and agreed upon together:   - I've refilled your pain medications. Take them as discussed in clinic today. - I have scheduled an abdominal CT to be obtained at the [facility]. My staff will schedule this and give you the date and time of your appointment before you leave her today. - If you develop any worsening symptoms. Fevers. Chills. Vomiting. Diarrhea. Or bloody/black stools do not hesitate to come back to our office.

## 2016-07-18 NOTE — Progress Notes (Signed)
   HPI  CC: Abdominal pain Patient is here for follow-up on his abdominal pain. He recently underwent surgical debridement of his previous surgical sites. During that procedure they removed a irritated stitch and some loose mesh. Wound appears to be healing relatively well but patient is complaining of worsening pain beginning at the surgical site and extending laterally towards the right lower quadrant. He endorses 2 recent episodes of vomiting yesterday (NBNB). He continues to have regular bowel movements. He denies any recent fevers, chills, headache, diaphoresis, weakness, numbness, paresthesias.  Review of Systems    See HPI for ROS. All other systems reviewed and are negative.  CC, SH/smoking status, and VS noted  Objective: BP 132/70   Pulse (!) 109   Temp 98.1 F (36.7 C) (Oral)   Ht 5\' 10"  (1.778 m)   Wt 168 lb 3.2 oz (76.3 kg)   BMI 24.13 kg/m  Gen:NAD, alert, cooperative, and pleasant. HEENT:NCAT, EOMI, PERRL CV:RRR, no murmur Resp:CTAB, mild wheezing bilat, non-labored Abd:S, ND, extensive surgical scarring noted along the abdomen. Midline surgical incision noted without extensive erythema, purulence, or drainage. Incisional site appears to be well healing. Tenderness at this site, with some extension towards the RLQ. No evidence of induration, or erythema. No rebound or guarding. Ext:No edema, warm  Assessment and plan:  Central abdominal pain Patient is here for follow-up on his abdominal pain. It appears as though he is having some acute worsening in his pain with new vomiting. Abdominal exam yielded tenderness at the surgical site with some extension to the RLQ. No rebound or guarding noted. Patient is having regular BMs. - Encourage adequate hydration and Brat diet as tolerated. - CT abdomen with contrast ordered. - Refilled pain medication - Patient has follow-up with surgeon in 6-7 days. - I will contact patient with CT results. - Strict return precautions  discussed.   Orders Placed This Encounter  Procedures  . CT Abdomen Pelvis W Contrast    168 lbs / no hx kidney dz , kidney ca, no solitary kidney , no diab / no allergy to iv contrast / medicaid / desiree / mdc / epic order     Standing Status:   Future    Standing Expiration Date:   10/16/2017    Order Specific Question:   If indicated for the ordered procedure, I authorize the administration of contrast media per Radiology protocol    Answer:   Yes    Order Specific Question:   Reason for Exam (SYMPTOM  OR DIAGNOSIS REQUIRED)    Answer:   acute RLQ abdom pain    Order Specific Question:   Preferred imaging location?    Answer:   [facility]  . BASIC METABOLIC PANEL WITH GFR    Meds ordered this encounter  Medications  . oxyCODONE-acetaminophen (PERCOCET) 10-325 MG tablet    Sig: Take 1 tablet by mouth every 6 (six) hours as needed for pain.    Dispense:  60 tablet    Refill:  0     Elberta Leatherwood, MD,MS,  PGY3 07/18/2016 11:14 AM

## 2016-07-19 ENCOUNTER — Ambulatory Visit
Admission: RE | Admit: 2016-07-19 | Discharge: 2016-07-19 | Disposition: A | Discharge status: Home / Self Care | Payer: Medicaid Other | Source: Ambulatory Visit | Attending: Family Medicine | Admitting: Family Medicine

## 2016-07-19 DIAGNOSIS — R109 Unspecified abdominal pain: Secondary | ICD-10-CM

## 2016-07-19 MED ORDER — IOPAMIDOL (ISOVUE-300) INJECTION 61%
100.0000 mL | Freq: Once | INTRAVENOUS | Status: AC | PRN
  Administered 2016-07-19: 100 mL via INTRAVENOUS

## 2016-07-25 ENCOUNTER — Telehealth: Payer: Self-pay | Admitting: Psychology

## 2016-07-25 NOTE — Telephone Encounter (Signed)
Timothy [name] called to check in on his [facility] appointment.  It is for 2/7 at 9:00.  He reports he has been forgetting a lot, falling, everything seems bright.  He says Dr. Alease Frame ran some tests and things came back normal.    He also notes significant irritability that he attributed to the [name].  He stopped that and restarted Abilify 5 mg.  He says that he and his girlfriend have noticed a change for the better in terms of irritability.  I looked back in the record and can not see when or why the Abilify was stopped.  I reminded Timothy [name] that his report of increasing irritability seemed temporally associated with the anniversary of his Dad's death and that this could be playing a role.  He admits he has been thinking daily about his step-mom and her lack of contact with him.  He also states he is out of his Diazepam.  Per our records, he is due for a refill.  Will need two months worth to cover him until his 2/7 appointment.  Will get authorization from Dr. Tammi Klippel and call into the pharmacy.

## 2016-07-26 NOTE — Telephone Encounter (Signed)
Dr. Tammi Klippel authorized two months.  Called into [facility].  Informed patient.

## 2016-07-28 ENCOUNTER — Other Ambulatory Visit: Payer: Self-pay | Admitting: Family Medicine

## 2016-07-29 ENCOUNTER — Other Ambulatory Visit: Payer: Self-pay | Admitting: *Deleted

## 2016-07-29 NOTE — Telephone Encounter (Signed)
Pt calling wanting to know if dr would send in some pain meds until he can be seen in 1/16 because he is out. Please advise. Aarya Robinson Kennon Holter, CMA

## 2016-07-30 MED ORDER — OXYCODONE-ACETAMINOPHEN 10-325 MG PO TABS: 1.0000 | ORAL_TABLET | Freq: Four times a day (QID) | ORAL | 0 refills | Status: DC | PRN

## 2016-07-30 NOTE — Telephone Encounter (Signed)
2nd request. Please call when ready to pick up. [phone] ep

## 2016-07-30 NOTE — Telephone Encounter (Signed)
Short Rx will be waiting up front for patient later today (~3:15pm)

## 2016-08-06 ENCOUNTER — Encounter: Payer: Self-pay | Admitting: Student in an Organized Health Care Education/Training Program

## 2016-08-06 ENCOUNTER — Ambulatory Visit: Payer: Self-pay | Admitting: Family Medicine

## 2016-08-06 ENCOUNTER — Other Ambulatory Visit: Payer: Self-pay | Admitting: Family Medicine

## 2016-08-06 ENCOUNTER — Ambulatory Visit (INDEPENDENT_AMBULATORY_CARE_PROVIDER_SITE_OTHER): Payer: Medicaid Other | Admitting: Student in an Organized Health Care Education/Training Program

## 2016-08-06 VITALS — BP 136/90 | HR 109 | Temp 97.9°F | Ht 70.0 in | Wt 160.4 lb

## 2016-08-06 DIAGNOSIS — R1031 Right lower quadrant pain: Secondary | ICD-10-CM | POA: Diagnosis not present

## 2016-08-06 DIAGNOSIS — G8929 Other chronic pain: Secondary | ICD-10-CM | POA: Insufficient documentation

## 2016-08-06 MED ORDER — POLYETHYLENE GLYCOL 3350 17 G PO PACK: 17.0000 g | PACK | Freq: Every day | ORAL | Status: DC

## 2016-08-06 NOTE — Patient Instructions (Signed)
It was a pleasure seeing you today in our clinic. Today we discussed your abdominal pain. Here is the treatment plan we have discussed and agreed upon together:  - We ordered blood work today to rule out any more serious cause of pain - I also prescribed miralax to help with your bowel movements. Please take one packet in the morning and one in the evening, as needed to have 1-2 soft bowel movements per day.  Our clinic's number is [phone]. Please call with questions or concerns about what we discussed today.  Be well, Dr. Burr Medico

## 2016-08-06 NOTE — Assessment & Plan Note (Addendum)
Patient s/p 5 abdominal procedures including appendectomy. Bowel obstruction not likely given recent CT WNL and normal BM's.  Stool seen on CT abdomen, so consider some level of constipation/IBS. With pain out of proportion to exam, want to rule out any mesenteric ischemia although this seems less likely. Will order Lactic acid at this visit. Gall bladder pathology seems less likely with negative murphy sign, pain very localized to RLQ. - Start Miralax BID - As percocet may be worsening possible constipation, will hold off refilling at this time.   - Spoke with patient's behavioral health doctor Dr. Gwenlyn Saran who indicated some potential for opioid misuse in this patient, suggested controlled substances be deferred to PCP - Return precautions advised - Asked pt to schedule appt w PCP on way out of office today - Pt agreeable to this plan

## 2016-08-06 NOTE — Telephone Encounter (Signed)
Patient came to office request refill on RX oxycodone. Please let patient know [phone]

## 2016-08-06 NOTE — Progress Notes (Signed)
CC: Abdominal Pain  HPI: Timothy [name] is a 51 y.o. male with COPD, HTN, bipolar disorder, pain anxiety syndrome, who presents to [facility] today for follow up of abdominal pain.  He was recently seen in the clinic with the same problem after undergoing surgical debridement of previous surgical sites, at which time irritated stitch and some loose mesh were removed from his abdomen.  At his previous clinic visit on 12/28, he complained of worsening pain from the surgical site extending laterally towards the right lower abdomen.  At that time patient appeared nontoxic, CT abdomen with contrast was ordered (WNL) and pain medications were refilled.    Since that time, patient saw his surgeon one week ago who recommended follow up in three weeks and prescribed a few pain pills. - pain has been constant, has not worsened or improved.  - pain described as "twisting a wet towel" in his right lower quadrant.  - pain is worse with very large meals, worse at night, improved with percocet but this doesn't last past 3 hours - pain radiates to back with deep breathing - +multiple episodes of NBNB with large meals at night time - endorses normal stools once daily, last BM yesterday -  No fevers, chills, diaphoresis.   Review of Symptoms:  See HPI for ROS.   CC, SH/smoking status, and VS noted.  Objective: BP 136/90 (BP Location: Left Arm, Patient Position: Sitting, Cuff Size: Normal)   Pulse (!) 109   Temp 97.9 F (36.6 C) (Oral)   Ht 5\' 10"  (1.778 m)   Wt 160 lb 6.4 oz (72.8 kg)   SpO2 97%   BMI 23.02 kg/m  GEN: Overall nontoxic, though does complain of abd pain. Answers questions appropriately and pleasantly. RESPIRATORY: clear to auscultation bilaterally with no wheezes, rhonchi or rales, good effort CV: RRR, no m/r/g, no peripheral edema GI: +tenderness to palpation in right lower quadrant. +well-healing midline laparoscopic scar with small serous drainage. No peritoneal signs, negative Rosving's,  negative Murphy's. +voluntary guarding, no abdominal distension. +normoactive bowel sounds. GU: No CVA tenderness SKIN: warm and dry, no rashes or lesions NEURO: II-XII grossly intact, normal gait, peripheral sensation intact PSYCH: AAOx3, appropriate affect   CT ABDOMEN AND PELVIS WITH CONTRAST - 07/19/2016 IMPRESSION: 1. No acute findings within the abdomen or pelvis. No evidence of bowel obstruction or bowel inflammation. 2. No hernia.  Assessment and plan:  Abdominal pain, chronic, right lower quadrant Patient s/p 5 abdominal procedures including appendectomy. Bowel obstruction not likely given recent CT WNL and normal BM's.  Stool seen on CT abdomen, so consider some level of constipation/IBS. With pain out of proportion to exam, want to rule out any mesenteric ischemia although this seems less likely. Will order Lactic acid at this visit. Gall bladder pathology seems less likely with negative murphy sign, pain very localized to RLQ. - Start Miralax BID - As percocet may be worsening possible constipation, will hold off refilling at this time.   - Spoke with patient's behavioral health doctor Dr. Gwenlyn Saran who indicated some potential for opioid misuse in this patient, suggested controlled substances be deferred to PCP - Return precautions advised - Asked pt to schedule appt w PCP on way out of office today - Pt agreeable to this plan   Orders Placed This Encounter  Procedures  . Lactic Acid, Plasma    Meds ordered this encounter  Medications  . polyethylene glycol (MIRALAX / GLYCOLAX) packet 17 g    Everrett Coombe, MD,MS,  PGY1 08/06/2016 5:41 PM

## 2016-08-07 MED ORDER — OXYCODONE-ACETAMINOPHEN 10-325 MG PO TABS: 1.0000 | ORAL_TABLET | Freq: Four times a day (QID) | ORAL | 0 refills | Status: DC | PRN

## 2016-08-07 NOTE — Telephone Encounter (Signed)
Rx waiting up front

## 2016-08-08 LAB — LACTIC ACID, PLASMA: LACTIC ACID: 14 mg/dL (ref 4–16)

## 2016-08-09 ENCOUNTER — Encounter: Payer: Self-pay | Admitting: Student in an Organized Health Care Education/Training Program

## 2016-08-12 MED ORDER — CYCLOBENZAPRINE HCL 10 MG PO TABS: ORAL_TABLET | ORAL | 3 refills | Status: DC

## 2016-08-12 NOTE — Addendum Note (Signed)
Addended by: Valerie Roys on: 08/12/2016 12:06 PM   Modules accepted: Orders

## 2016-08-13 NOTE — Telephone Encounter (Signed)
I wrote for it on the 17th, so he would've had time to come get it since that time.

## 2016-08-16 ENCOUNTER — Ambulatory Visit (INDEPENDENT_AMBULATORY_CARE_PROVIDER_SITE_OTHER): Payer: Medicaid Other | Admitting: Family Medicine

## 2016-08-16 VITALS — BP 114/62 | HR 109 | Temp 97.8°F | Ht 70.0 in | Wt 156.8 lb

## 2016-08-16 DIAGNOSIS — S31109D Unspecified open wound of abdominal wall, unspecified quadrant without penetration into peritoneal cavity, subsequent encounter: Secondary | ICD-10-CM

## 2016-08-16 DIAGNOSIS — R112 Nausea with vomiting, unspecified: Secondary | ICD-10-CM | POA: Diagnosis not present

## 2016-08-16 DIAGNOSIS — S31109A Unspecified open wound of abdominal wall, unspecified quadrant without penetration into peritoneal cavity, initial encounter: Secondary | ICD-10-CM

## 2016-08-16 DIAGNOSIS — L089 Local infection of the skin and subcutaneous tissue, unspecified: Secondary | ICD-10-CM | POA: Insufficient documentation

## 2016-08-16 DIAGNOSIS — R1013 Epigastric pain: Secondary | ICD-10-CM | POA: Diagnosis present

## 2016-08-16 DIAGNOSIS — A419 Sepsis, unspecified organism: Secondary | ICD-10-CM | POA: Diagnosis present

## 2016-08-16 MED ORDER — OXYCODONE-ACETAMINOPHEN 5-325 MG PO TABS: 1.0000 | ORAL_TABLET | Freq: Four times a day (QID) | ORAL | 0 refills | Status: DC | PRN

## 2016-08-16 NOTE — Progress Notes (Signed)
HPI  CC: Worsening abdominal pain and vomiting. Patient is here for follow-up on his abdominal pain. He states that his pain is gone worse over the past few days/weeks. He endorses regular nausea and vomiting. Abdominal pain is persistent and located over the site of his recent surgery. He states that he is able to take food but if he eats too much or too fast and he regularly on its at up. Vomit is nonbloody and nonbilious, but occasionally brown. He endorses regular stools, and has had 3 very loose stools today.  He denies any recent fevers or chills. No headache, blurred vision, shortness of breath, chest pain, weakness, numbness, or paresthesias.  Patient did not report this, but according to our records patient has lost 12 lbs. in one month (since 07/18/16).  Review of Systems    See HPI for ROS. All other systems reviewed and are negative.  CC, SH/smoking status, and VS noted  Objective: BP 114/62   Pulse (!) 109   Temp 97.8 F (36.6 C) (Oral)   Ht 5\' 10"  (1.778 m)   Wt 156 lb 12.8 oz (71.1 kg)   SpO2 98%   BMI 22.50 kg/m  Gen:NAD, alert, cooperative, appears uncomfortable and worn-out. Thinner appearance than usual (almost cachectic). HEENT:NCAT, EOMI, PERRL CV:RRR, no murmur Resp:CTAB, mild wheezing bilat (baseline), non-labored Abd:S, ND, extensive surgical scarring noted along the abdomen. Midline surgical site with some erythema/granulation tissue, slight serosanguineous drainage, but without purulence. Tenderness at thissite, with some extension towards the RLQ. No evidence of induration, or erythema. No rebound or guarding. Significant epigastric tenderness noted as well which patient stated that he was previously unaware of until our exam. Negative Murphy's.  Ext:No edema, warm  Assessment and plan:  Abdominal pain, epigastric Patient is complaining of persistent and worsening abdominal pain. He now has nausea and vomiting. Review of previous visit 1 month  ago yields a weight loss of approximately 12 pounds. Exam is relatively unchanged from previous with the exception of significant epigastric tenderness that was almost more severe than the tenderness along the midline previously open surgical site. Patient is having regular loose but not watery bowel movements. He continues to drink 1-2 cups of coffee a day. He also consumes a Goody powder occasionally as well. I'm concerned about possible gastritis/ulceration. Patient's extensive abdominal surgery history makes adhesions, strictures, and obstructions a greater risk as well. - Continued to encourage removal of coffee and Goody powders from his diet. - Referral to gastroenterology (patient was remarkably called to set up this appointment while waiting to wrap up our visit; appointment set up for Monday of next week, 1/29)  Of note: After our visit I left a voicemail at his home (after noticing the 12 pound weight loss) informing him to have a low threshold for evaluation at the ED if his symptoms worsen. Although patient's case is very concerning I am comforted that he was able to set up an appointment with GI so quickly.  Chronic abdominal wound infection Wound which was left open after most recent surgical debridement had shown initial signs of encouraging wound healing. Unfortunately there has not been a significant amount of progress since the last time I saw him. I'm concerned that patient's smoking is having a significant effect on his ability to heal. Also, due to the duration in which he has been dealing with this issue we may have to address the possibility of a high protein diet if patient shows signs of hypo-albuminemia. - Obtain  CMP at follow-up visit if wound healing continues to appear stalled - Encouraged patient to discuss with general surgeon. - As always, encouraged smoking cessation.   Orders Placed This Encounter  Procedures  . Ambulatory referral to Gastroenterology    Referral  Priority:   Routine    Referral Type:   Consultation    Referral Reason:   Specialty Services Required    Number of Visits Requested:   1    Meds ordered this encounter  Medications  . oxyCODONE-acetaminophen (ROXICET) 5-325 MG tablet    Sig: Take 1 tablet by mouth every 6 (six) hours as needed for severe pain.    Dispense:  60 tablet    Refill:  0     Timothy Leatherwood, MD,MS,  PGY3 08/16/2016 7:10 PM

## 2016-08-16 NOTE — Assessment & Plan Note (Signed)
Wound which was left open after most recent surgical debridement had shown initial signs of encouraging wound healing. Unfortunately there has not been a significant amount of progress since the last time I saw him. I'm concerned that patient's smoking is having a significant effect on his ability to heal. Also, due to the duration in which he has been dealing with this issue we may have to address the possibility of a high protein diet if patient shows signs of hypo-albuminemia. - Obtain CMP at follow-up visit if wound healing continues to appear stalled - Encouraged patient to discuss with general surgeon. - As always, encouraged smoking cessation.

## 2016-08-16 NOTE — Assessment & Plan Note (Signed)
Patient is complaining of persistent and worsening abdominal pain. He now has nausea and vomiting. Review of previous visit 1 month ago yields a weight loss of approximately 12 pounds. Exam is relatively unchanged from previous with the exception of significant epigastric tenderness that was almost more severe than the tenderness along the midline previously open surgical site. Patient is having regular loose but not watery bowel movements. He continues to drink 1-2 cups of coffee a day. He also consumes a Goody powder occasionally as well. I'm concerned about possible gastritis/ulceration. Patient's extensive abdominal surgery history makes adhesions, strictures, and obstructions a greater risk as well. - Continued to encourage removal of coffee and Goody powders from his diet. - Referral to gastroenterology (patient was remarkably called to set up this appointment while waiting to wrap up our visit; appointment set up for Monday of next week, 1/29)  Of note: After our visit I left a voicemail at his home (after noticing the 12 pound weight loss) informing him to have a low threshold for evaluation at the ED if his symptoms worsen. Although patient's case is very concerning I am comforted that he was able to set up an appointment with GI so quickly.

## 2016-08-16 NOTE — Assessment & Plan Note (Signed)
>>  ASSESSMENT AND PLAN FOR CHRONIC ABDOMINAL WOUND INFECTION WRITTEN ON 08/16/2016  7:10 PM BY MCKEAG, IAN D, MD  Wound which was left open after most recent surgical debridement had shown initial signs of encouraging wound healing. Unfortunately there has not been a significant amount of progress since the last time I saw him. I'm concerned that patient's smoking is having a significant effect on his ability to heal. Also, due to the duration in which he has been dealing with this issue we may have to address the possibility of a high protein diet if patient shows signs of hypo-albuminemia. - Obtain CMP at follow-up visit if wound healing continues to appear stalled - Encouraged patient to discuss with general surgeon. - As always, encouraged smoking cessation.

## 2016-08-16 NOTE — Patient Instructions (Signed)
It was a pleasure seeing you today in our clinic. Today we discussed your abdominal pain. Here is the treatment plan we have discussed and agreed upon together:   - I have placed a referral to a gastroenterologist. Monika Salk be contacted to schedule an appointment sometime next week. - Continue taking the MiraLAX as prescribed. - I would like to see you back in 4 weeks.

## 2016-08-26 ENCOUNTER — Ambulatory Visit: Payer: Self-pay | Admitting: Physician Assistant

## 2016-08-28 ENCOUNTER — Ambulatory Visit (INDEPENDENT_AMBULATORY_CARE_PROVIDER_SITE_OTHER): Payer: Medicaid Other | Admitting: Psychology

## 2016-08-28 DIAGNOSIS — F3162 Bipolar disorder, current episode mixed, moderate: Secondary | ICD-10-CM

## 2016-08-28 NOTE — Progress Notes (Signed)
Reason for follow-up:  Mood follow-up.  He recently went off of his Latuda and restarted Abilify due to increased irritability.  Report today is that he switched back to [name] about three weeks ago.    Issues discussed:  He is not eating well.  He has been taking [name] about every other day.  Not likely getting enough to help mood / irritability.  He reports significant pain from his stomach along with intermittent nausea and vomiting that is affecting his mood.  Has experimented with decreasing caffeine significantly to see if that would help.  It did not.  Smoking is down to about 1 to 1.5 packs a day secondary to sleeping more and not feeling well.  He does not think he can quit smoking all together but wonders if smoking may be contributing to his pain / difficulty healing.    Called step mom recently and this went well.

## 2016-08-28 NOTE — Patient Instructions (Addendum)
Please schedule a follow-up for:  March 7th at 9:00.  Dr. Tammi Klippel made several recommendations in consultation with Dr. Alease Frame.  1.  You can take 8 mg of Zofran when feeling nauseous to see if you can get a better response.  2.  Start Gabapentin.  100 mg three times a day or one in the morning and two at bedtime depending on which works better.  This medicine works to reduce pain that comes from damaged nerve.  Dr. Tammi Klippel thinks that may be a portion of why you are hurting like you are.  3.  Call Dr. Alease Frame shortly to discuss your pain medicine.  He said you have had phone conversations in the past and he will be happy to talk to you about this.    4.  Finally - Latuda needs food in order to work its best.  Please take it with your best meal of the day.

## 2016-08-28 NOTE — Assessment & Plan Note (Signed)
Not well managed although his report of mood issues is significantly tied to his pain and nausea.  His affect is within normal limits - smiles a few times.  He appears in pain.  Dr. Tammi Klippel thought that absorption of Latuda may be playing a role.  Discussed with PCP increasing Zofran to 8 mg as needed.  Dr. Alease Frame agreed.  Dr. Tammi Klippel wrote the script.  Also discussed pain.  Dr. Alease Frame thought adding gabapentin 100 mg three times a day would be a reasonable strategy.  Dr. Tammi Klippel discussed this with the patient.  He was in agreement.  See patient instructions for further plan.

## 2016-08-30 ENCOUNTER — Encounter: Payer: Self-pay | Admitting: Family Medicine

## 2016-09-04 ENCOUNTER — Ambulatory Visit (INDEPENDENT_AMBULATORY_CARE_PROVIDER_SITE_OTHER): Payer: Medicaid Other | Admitting: Family Medicine

## 2016-09-04 ENCOUNTER — Encounter: Payer: Self-pay | Admitting: Family Medicine

## 2016-09-04 DIAGNOSIS — R1031 Right lower quadrant pain: Secondary | ICD-10-CM

## 2016-09-04 DIAGNOSIS — G8929 Other chronic pain: Secondary | ICD-10-CM

## 2016-09-04 MED ORDER — OXYCODONE-ACETAMINOPHEN 5-325 MG PO TABS: 1.0000 | ORAL_TABLET | Freq: Four times a day (QID) | ORAL | 0 refills | Status: DC | PRN

## 2016-09-04 NOTE — Assessment & Plan Note (Signed)
Patient is here to follow-up on his chronic abdominal pain with current wound healing. Pain seems to have plateaued from its recent exacerbation. Still higher than what was considered his baseline pain 4-6 months ago. Was recently started on gabapentin. This is provided some additional benefit and pain control, but is still at a relatively low dose. - Increase gabapentin from 100 mg 3 times a day to 200 mg 3 times a day (gradual) - Refilled Percocet >> discussed with patient that current prescription would be the maximum amount I would be comfortable writing for him in one month. My hope is that pain will gradually improve over the coming months. Patient stated his understanding as well as his agreement with this plan. - Follow-up one month or sooner with any setbacks

## 2016-09-04 NOTE — Progress Notes (Signed)
   HPI  CC: Pain management Patient is here to discuss his pain management for his chronic abdominal pain. He states that pain is still increased from his previous baseline but seems to have leveled off since his most recent acute exacerbation requiring surgical intervention. He endorses improved appetite. His nausea and vomiting has significantly subsided. He believes he has gained weight since our last visit. Patient endorses sleeping slightly better than he was. Patient denies any fevers, chills, headache, blurred vision, diaphoresis, chest pain, shortness of breath, diarrhea, hematochezia, melena.  Patient was recently started on gabapentin. He states that this medication has caused him to be slightly more drowsy after taking it but that affect has improved since initiating this medication. As for the pain control, he states that this medication does "take the edge off" but does not fully reduce his pain to a tolerable level. He is very interested in increasing the dosage of this medication as he feels that may provide additional benefit.  Review of Systems    See HPI for ROS. All other systems reviewed and are negative.  CC, SH/smoking status, and VS noted  Objective: BP 132/90   Pulse 90   Temp 97.8 F (36.6 C) (Oral)   Ht 5\' 10"  (1.778 m)   Wt 163 lb (73.9 kg)   BMI 23.39 kg/m  Gen: NAD, alert, cooperative, and pleasant. HEENT: NCAT, EOMI, PERRL CV: RRR, no murmur Resp: CTAB, no wheezes, non-labored Abd:S, ND, extensive surgical scarring noted along the abdomen. Midline surgical site with some erythema/healing granulation tissue, slight serosanguineous drainage (much improved), but without purulence. Tenderness at thissite, with some extension towards the RLQ. No evidence of induration, or erythema. No rebound or guarding. Significant epigastric tenderness noted as well which patient stated that he was previously unaware of until our exam. Negative Murphy's. Ext: No edema,  warm Neuro: Alert and oriented, Speech clear, No gross deficits  Assessment and plan:  Abdominal pain, chronic, right lower quadrant Patient is here to follow-up on his chronic abdominal pain with current wound healing. Pain seems to have plateaued from its recent exacerbation. Still higher than what was considered his baseline pain 4-6 months ago. Was recently started on gabapentin. This is provided some additional benefit and pain control, but is still at a relatively low dose. - Increase gabapentin from 100 mg 3 times a day to 200 mg 3 times a day (gradual) - Refilled Percocet >> discussed with patient that current prescription would be the maximum amount I would be comfortable writing for him in one month. My hope is that pain will gradually improve over the coming months. Patient stated his understanding as well as his agreement with this plan. - Follow-up one month or sooner with any setbacks   Meds ordered this encounter  Medications  . oxyCODONE-acetaminophen (ROXICET) 5-325 MG tablet    Sig: Take 1 tablet by mouth every 6 (six) hours as needed for severe pain.    Dispense:  75 tablet    Refill:  0     Elberta Leatherwood, MD,MS,  PGY3 09/04/2016 3:16 PM

## 2016-09-04 NOTE — Patient Instructions (Signed)
It was a pleasure seeing you today in our clinic. Today we discussed your abdominal pain. Here is the treatment plan we have discussed and agreed upon together:   - I've increased your gabapentin dose to 200 mg 3 times a day. - I have refilled your pain medications today. As discussed in clinic this will be the last increase in the amount of tablets you'll be provided. Beginning next visit we will try to start decreasing this medication.

## 2016-09-23 ENCOUNTER — Telehealth: Payer: Self-pay | Admitting: Family Medicine

## 2016-09-23 NOTE — Telephone Encounter (Signed)
Pt called and would like enough of his pain medication to last until 10/02/16. He has an appointment with you then. Please let patient know so that he can come and pick up. jw

## 2016-09-24 NOTE — Telephone Encounter (Signed)
Pt is calling again about his pain meds

## 2016-09-25 ENCOUNTER — Other Ambulatory Visit: Payer: Self-pay | Admitting: Family Medicine

## 2016-09-25 ENCOUNTER — Ambulatory Visit (INDEPENDENT_AMBULATORY_CARE_PROVIDER_SITE_OTHER): Payer: Medicaid Other | Admitting: Psychology

## 2016-09-25 DIAGNOSIS — F3162 Bipolar disorder, current episode mixed, moderate: Secondary | ICD-10-CM | POA: Diagnosis not present

## 2016-09-25 MED ORDER — OXYCODONE-ACETAMINOPHEN 5-325 MG PO TABS: 1.0000 | ORAL_TABLET | Freq: Three times a day (TID) | ORAL | 0 refills | Status: DC | PRN

## 2016-09-25 NOTE — Telephone Encounter (Signed)
I will have his Rx up front around 12:30 today. Please let him know

## 2016-09-25 NOTE — Progress Notes (Signed)
Reason for follow-up:  Continued mood management.  Last time he was here he was in significant pain, was having difficulty with nausea making eating a challenge (may have been affecting Latuda absorption), and mood was hard to assess secondary to pain.    Issues discussed:  Mood is better.  Less irritable.  Less depressed.  Is having some difficulty with his girlfriend of 2 years.  Has periods of activity - especially in warmer weather.  Pain is better managed with gabapentin but still not great.

## 2016-09-25 NOTE — Telephone Encounter (Signed)
Patient states that he has already spoken with MD and has already picked up the script. Jazmin Hartsell,CMA

## 2016-09-25 NOTE — Patient Instructions (Signed)
Please schedule a follow-up:  May 2nd at 9:00.  We refilled your medications today.

## 2016-09-25 NOTE — Assessment & Plan Note (Signed)
PHQ-9 of 11 down from 15 last visit.  Report of mood is improved.  Pain is such a factor in his day to day function.  Does best when he can have periods of rest and activity.  Cold weather and pain have gotten in the way of that.  Not sure if irritability with girlfriend is mood related or a function of relationship dysfunction generally speaking.  Refilled medications.  Did not make any changes.

## 2016-09-30 ENCOUNTER — Telehealth: Payer: Self-pay | Admitting: *Deleted

## 2016-09-30 NOTE — Telephone Encounter (Signed)
Prior Authorization received from [employer] for Losartan. Pt failed Lisinopril-cough. Received PA approval for Losartan 100 mg via [employer] [employer].  Med approved for 09/27/16 - 09/27/2017.  [facility] pharmacy informed.  PA approval number [id number]. Derl Barrow, RN

## 2016-10-02 ENCOUNTER — Ambulatory Visit: Payer: Medicaid Other | Admitting: Family Medicine

## 2016-10-07 ENCOUNTER — Telehealth: Payer: Self-pay | Admitting: Family Medicine

## 2016-10-07 NOTE — Telephone Encounter (Signed)
Called to confirm/remind patient about scheduled appointment and to advise to bring medications and to arrive early for check in.

## 2016-10-08 ENCOUNTER — Ambulatory Visit (INDEPENDENT_AMBULATORY_CARE_PROVIDER_SITE_OTHER): Payer: Medicaid Other | Admitting: Family Medicine

## 2016-10-08 ENCOUNTER — Encounter: Payer: Self-pay | Admitting: Family Medicine

## 2016-10-08 ENCOUNTER — Telehealth: Payer: Self-pay | Admitting: Internal Medicine

## 2016-10-08 DIAGNOSIS — R1011 Right upper quadrant pain: Secondary | ICD-10-CM

## 2016-10-08 DIAGNOSIS — S31109D Unspecified open wound of abdominal wall, unspecified quadrant without penetration into peritoneal cavity, subsequent encounter: Secondary | ICD-10-CM | POA: Diagnosis not present

## 2016-10-08 DIAGNOSIS — L089 Local infection of the skin and subcutaneous tissue, unspecified: Secondary | ICD-10-CM | POA: Diagnosis not present

## 2016-10-08 DIAGNOSIS — G8929 Other chronic pain: Secondary | ICD-10-CM | POA: Diagnosis not present

## 2016-10-08 MED ORDER — OXYCODONE-ACETAMINOPHEN 5-325 MG PO TABS: 1.0000 | ORAL_TABLET | Freq: Four times a day (QID) | ORAL | 0 refills | Status: DC | PRN

## 2016-10-08 MED ORDER — GABAPENTIN 300 MG PO CAPS: 300.0000 mg | ORAL_CAPSULE | Freq: Three times a day (TID) | ORAL | 2 refills | Status: DC

## 2016-10-08 NOTE — Telephone Encounter (Signed)
Received page to emergency after hours line. Patient was given prescription for oxycodone-acetaminophen today but could not pick up at pharmacy due to lack of prior authorization (on Medicaid). Alerted patient's PCP Dr. Alease Frame of this; he is agreeable to me sending in the prior auth and faxing new Rx if needed morning of 3/21. Will call patient once paperwork completed. Let him know it can sometimes take a few days for this to be processed.  Olene Floss, MD [place], PGY-2

## 2016-10-08 NOTE — Assessment & Plan Note (Signed)
Patient seems to have worsening midline abdominal wound. No superficial signs of acute infection. - Plan as above

## 2016-10-08 NOTE — Assessment & Plan Note (Signed)
Patient is having worsening abdominal pain. He endorses recent vomiting. Patient is down 17 pounds from our last visit last month. Patient's stools have been regular and nonbloody. - Refill pain medications - Discussed when necessary Zofran. - Continue gabapentin - Encouraged visit with general surgeon. - Discussed appropriate low-fat/low grease diet - Discussed smoking cessation

## 2016-10-08 NOTE — Assessment & Plan Note (Signed)
>>  ASSESSMENT AND PLAN FOR CHRONIC ABDOMINAL WOUND INFECTION WRITTEN ON 10/08/2016  6:04 PM BY MCKEAG, IAN D, MD  Patient seems to have worsening midline abdominal wound. No superficial signs of acute infection. - Plan as above

## 2016-10-08 NOTE — Patient Instructions (Signed)
It was a pleasure seeing you today in our clinic. Today we discussed your abdominal pain. Here is the treatment plan we have discussed and agreed upon together:   - Continue taking the gabapentin 300 mg 3 times a day. - I would like for you to contact your general surgeon to see if he can see you earlier than next week. - I would like for you to focus a significant amount of attention on your diet. Reducing the amount of greasy foods and fatty foods should be a primary focus of yours.   Nonalcoholic Fatty Liver Disease Diet Nonalcoholic fatty liver disease is a condition that causes fat to accumulate in and around the liver. The disease makes it harder for the liver to work the way that it should. Following a healthy diet can help to keep nonalcoholic fatty liver disease under control. It can also help to prevent or improve conditions that are associated with the disease, such as heart disease, diabetes, high blood pressure, and abnormal cholesterol levels. Along with regular exercise, this diet:  Promotes weight loss.  Helps to control blood sugar levels.  Helps to improve the way that the body uses insulin. What do I need to know about this diet?  Use the glycemic index (GI) to plan your meals. The index tells you how quickly a food will raise your blood sugar. Choose low-GI foods. These foods take a longer time to raise blood sugar.  Keep track of how many calories you take in. Eating the right amount of calories will help you to achieve a healthy weight.  You may want to follow a Mediterranean diet. This diet includes a lot of vegetables, lean meats or fish, whole grains, fruits, and healthy oils and fats. What foods can I eat? Grains  Whole grains, such as whole-wheat or whole-grain breads, crackers, tortillas, cereals, and pasta. Stone-ground whole wheat. Pumpernickel bread. Unsweetened oatmeal. Bulgur. Barley. Quinoa. Brown or wild rice. Corn or whole-wheat flour  tortillas. Vegetables  Lettuce. Spinach. Peas. Beets. Cauliflower. Cabbage. Broccoli. Carrots. Tomatoes. Squash. Eggplant. Herbs. Peppers. Onions. Cucumbers. Brussels sprouts. Yams and sweet potatoes. Beans. Lentils. Fruits  Bananas. Apples. Oranges. Grapes. Papaya. Mango. Pomegranate. Kiwi. Grapefruit. Cherries. Meats and Other Protein Sources  Seafood and shellfish. Lean meats. Poultry. Tofu. Dairy  Low-fat or fat-free dairy products, such as yogurt, cottage cheese, and cheese. Beverages  Water. Sugar-free drinks. Tea. Coffee. Low-fat or skim milk. Milk alternatives, such as soy or almond milk. Real fruit juice. Condiments  Mustard. Relish. Low-fat, low-sugar ketchup and barbecue sauce. Low-fat or fat-free mayonnaise. Sweets and Desserts  Sugar-free sweets. Fats and Oils  Avocado. Canola or olive oil. Nuts and nut butters. Seeds. The items listed above may not be a complete list of recommended foods or beverages. Contact your dietitian for more options.  What foods are not recommended? Palm oil and coconut oil. Processed foods. Fried foods. Sweetened drinks, such as sweet tea, milkshakes, snow cones, iced sweet drinks, and sodas. Alcohol. Sweets. Foods that contain a lot of salt or sodium. The items listed above may not be a complete list of foods and beverages to avoid. Contact your dietitian for more information.  This information is not intended to replace advice given to you by your health care provider. Make sure you discuss any questions you have with your health care provider. Document Released: 11/22/2014 Document Revised: 12/14/2015 Document Reviewed: 08/02/2014 Elsevier Interactive Patient Education  2017 [employer].

## 2016-10-08 NOTE — Progress Notes (Signed)
   HPI  CC: Chronic abdominal pain Patient is here to discuss his chronic abdominal pain. He states that he has been having worsening pain with some nausea and vomiting. He states the pain has been getting worse over the past few days to weeks. He endorses decent appetite with intermittent vomiting that does not occur every day. He has been having regular stools, no melena/hematochezia. He denies any recent fevers. Patient does endorse some worsening presentation of his surgical abdominal scar, with some drainage. He denies any trauma or injury to his abdomen.  Review of Systems See HPI for ROS.   CC, SH/smoking status, and VS noted  Objective: BP (!) 142/90   Pulse (!) 112   Temp 97.7 F (36.5 C) (Oral)   Wt 146 lb 3.2 oz (66.3 kg)   SpO2 99%   BMI 20.98 kg/m  Gen: NAD, alert, cooperative, and uncomfortable appearing CV: RRR, no murmur Resp: CTAB, no wheezes, non-labored Abd: SNTND, midline surgical scar notably open with serous drainage. No significant erythema or purulence suggesting infection. Site is very tender to the touch.   Assessment and plan:  Abdominal pain, chronic, diffuse Patient is having worsening abdominal pain. He endorses recent vomiting. Patient is down 17 pounds from our last visit last month. Patient's stools have been regular and nonbloody. - Refill pain medications - Discussed when necessary Zofran. - Continue gabapentin - Encouraged visit with general surgeon. - Discussed appropriate low-fat/low grease diet - Discussed smoking cessation  Chronic abdominal wound infection Patient seems to have worsening midline abdominal wound. No superficial signs of acute infection. - Plan as above   Meds ordered this encounter  Medications  . gabapentin (NEURONTIN) 300 MG capsule    Sig: Take 1 capsule (300 mg total) by mouth 3 (three) times daily.    Dispense:  90 capsule    Refill:  2  . oxyCODONE-acetaminophen (ROXICET) 5-325 MG tablet    Sig: Take 1  tablet by mouth every 6 (six) hours as needed for severe pain.    Dispense:  75 tablet    Refill:  0     Elberta Leatherwood, MD,MS,  PGY3 10/08/2016 6:04 PM

## 2016-10-09 ENCOUNTER — Telehealth: Payer: Self-pay | Admitting: Internal Medicine

## 2016-10-09 MED ORDER — OXYCODONE-ACETAMINOPHEN 5-325 MG PO TABS: 1.0000 | ORAL_TABLET | Freq: Four times a day (QID) | ORAL | 0 refills | Status: DC | PRN

## 2016-10-09 NOTE — Telephone Encounter (Signed)
PA completed online at [facility] portal site.  PA was approved until 04/07/17. Approval number: [id number].  Patient is aware.  Derl Barrow, RN

## 2016-10-09 NOTE — Telephone Encounter (Signed)
Left message for patient letting him know PA was completed. Recommended he call his pharmacy later today to check on prescription status.   Olene Floss, MD [place], PGY-2

## 2016-10-14 ENCOUNTER — Telehealth: Payer: Self-pay | Admitting: Family Medicine

## 2016-10-14 DIAGNOSIS — R0602 Shortness of breath: Secondary | ICD-10-CM

## 2016-10-14 DIAGNOSIS — J441 Chronic obstructive pulmonary disease with (acute) exacerbation: Secondary | ICD-10-CM

## 2016-10-14 NOTE — Telephone Encounter (Signed)
Called patient. Discussed plan he had come up with with his surgeon. I had anticipating a similar plan. Patient will call back once he knows when his surgery will take place.

## 2016-10-14 NOTE — Telephone Encounter (Signed)
Pt saw surgeon on Friday, they want to do another procedure. Pt would like PCP to call him regarding this and pt will give PCP more info. ep

## 2016-10-24 MED ORDER — ALBUTEROL SULFATE HFA 108 (90 BASE) MCG/ACT IN AERS
2.0000 | INHALATION_SPRAY | Freq: Four times a day (QID) | RESPIRATORY_TRACT | 0 refills | Status: DC | PRN
Start: 2016-10-24 — End: 2016-12-15

## 2016-10-24 NOTE — Telephone Encounter (Signed)
[name] After Hours Telephone Line   Number received via page: [phone] Person calling: [name]  Reason for call: Needs a refill on his albuterol inhaler.  He indicates he did not realize it had run out at the pharmacy. I provided with 1 refill, discussed following up with PCP as needed for further refills.   Kerrin Mo PGY-2 [place]

## 2016-11-04 ENCOUNTER — Telehealth: Payer: Self-pay | Admitting: Family Medicine

## 2016-11-04 NOTE — Telephone Encounter (Signed)
Called to confirm appointment for 11/05/16. Patient was advised to arrive early for check-in and to bring any current medications. No further concerns at this time. °

## 2016-11-05 ENCOUNTER — Ambulatory Visit: Payer: Self-pay | Admitting: Family Medicine

## 2016-11-05 ENCOUNTER — Ambulatory Visit: Payer: Self-pay | Admitting: General Surgery

## 2016-11-08 ENCOUNTER — Ambulatory Visit: Payer: Medicaid Other | Admitting: Family Medicine

## 2016-11-11 ENCOUNTER — Ambulatory Visit (INDEPENDENT_AMBULATORY_CARE_PROVIDER_SITE_OTHER): Payer: Medicaid Other | Admitting: Family Medicine

## 2016-11-11 VITALS — Temp 97.7°F | Wt 156.0 lb

## 2016-11-11 DIAGNOSIS — Z79899 Other long term (current) drug therapy: Secondary | ICD-10-CM

## 2016-11-11 DIAGNOSIS — R1031 Right lower quadrant pain: Secondary | ICD-10-CM | POA: Diagnosis not present

## 2016-11-11 DIAGNOSIS — G8929 Other chronic pain: Secondary | ICD-10-CM

## 2016-11-11 DIAGNOSIS — R1013 Epigastric pain: Secondary | ICD-10-CM | POA: Diagnosis not present

## 2016-11-11 MED ORDER — CYCLOBENZAPRINE HCL 10 MG PO TABS: ORAL_TABLET | ORAL | 3 refills | Status: DC

## 2016-11-11 MED ORDER — GABAPENTIN 300 MG PO CAPS: 600.0000 mg | ORAL_CAPSULE | Freq: Two times a day (BID) | ORAL | 2 refills | Status: DC

## 2016-11-11 MED ORDER — OXYCODONE-ACETAMINOPHEN 10-325 MG PO TABS: 1.0000 | ORAL_TABLET | ORAL | 0 refills | Status: DC | PRN

## 2016-11-11 NOTE — Assessment & Plan Note (Signed)
Stable/slightly worse:  Patient is here with persistent abdominal pain. No evidence of acute infectious process at this time. - Pain control - Currently followed by general surgery; patient sees surgeon tomorrow.

## 2016-11-11 NOTE — Patient Instructions (Signed)
It was a pleasure seeing you today in our clinic. Today we discussed your pain. Here is the treatment plan we have discussed and agreed upon together:   - I've increase the dosing of her gabapentin to 2 capsules twice a day. - I've refilled your Percocet. - Because of your recent history of weight loss as well as your persistent nausea, vomiting, and various risk factors (smoking, history of alcohol use), I think that it would be wise to have you undergo a "EGD".   - A referral to gastroenterology was placed back in January of this year (with [facility] gastroenterology). If your surgeon is unable to perform this procedure then I strongly recommend that you contacting the [place] office to schedule a appointment.  As always I think that your habit of smoking a significant amount of cigarettes every day is likely the most significant factor playing a role in some of the wound healing complications you're currently experiencing.

## 2016-11-11 NOTE — Progress Notes (Signed)
   HPI  CC: Abdominal pain/nausea/vomiting/weight loss Patient is here regarding his persistent chronic abdominal pain. Patient has been dealing with worsening symptoms and complications of previous surgical interventions. He continues to have significant nausea and vomiting associated with his abdominal pain. He was noted to have lost a significant amount of weight (~20lb) earlier this year, which she states was due to his inability to keep food down. Patient has since regained some of this weight but has been unable to regain back full amount.  Patient's abdominal pain continues to be located paraumbilically. Slight extension along the right quadrants. Patient is able to identify 2 very painful subcutaneous nodules. He denies any fevers or chills. He endorses body aches and abdominal pain. No diarrhea or constipation. No weakness or numbness. Occasional lightheadedness. No headaches.  Review of Systems See HPI for ROS.   CC, SH/smoking status, and VS noted  Objective: Temp 97.7 F (36.5 C) (Oral)   Wt 156 lb (70.8 kg)   BMI 22.38 kg/m  Gen: NAD, alert, cooperative. Unwell-appearing but nontoxic. CV: Well-perfused. Resp: Non-labored. Neuro: Sensation intact throughout. Abd: S, ND, TTP along the midline with extension towards the right quadrants. 2 very tender, firm, BB sized subcutaneous masses noted just lateral to the right rectus abdominis muscles. No evidence of fluctuance or induration. Healing surgical site at the midline shows granulation tissue with possible worsening overall appearance.   Assessment and plan:  Abdominal pain, chronic, right lower quadrant Stable/slightly worse:  Patient is here with persistent abdominal pain. No evidence of acute infectious process at this time. - Pain control - Currently followed by general surgery; patient sees surgeon tomorrow.  Abdominal pain, epigastric Patient continues to have issues with nausea vomiting and abdominal pain. With  his recent weight loss I had thought it would be wise to have him seen by GI to obtain a EGD. This has not yet been done. - I've asked patient to contact the gastroenterology office to set up another appointment in hopes to be evaluated for an EGD.   Orders Placed This Encounter  Procedures  . Comprehensive metabolic panel    Order Specific Question:   Has the patient fasted?    Answer:   No  . CBC    Meds ordered this encounter  Medications  . gabapentin (NEURONTIN) 300 MG capsule    Sig: Take 2 capsules (600 mg total) by mouth 2 (two) times daily.    Dispense:  120 capsule    Refill:  2  . cyclobenzaprine (FLEXERIL) 10 MG tablet    Sig: TAKE 1 TABLET BY MOUTH THREE TIMES DAILY AS NEEDED FOR MUSCULE SPASMS    Dispense:  30 tablet    Refill:  3  . oxyCODONE-acetaminophen (PERCOCET) 10-325 MG tablet    Sig: Take 1 tablet by mouth every 4 (four) hours as needed for pain.    Dispense:  60 tablet    Refill:  0     Elberta Leatherwood, MD,MS,  PGY3 11/11/2016 6:30 PM

## 2016-11-11 NOTE — Assessment & Plan Note (Addendum)
Patient continues to have issues with nausea vomiting and abdominal pain. With his recent weight loss I had thought it would be wise to have him seen by GI to obtain a EGD. This has not yet been done. - I've asked patient to contact the gastroenterology office to set up another appointment in hopes to be evaluated for an EGD.

## 2016-11-12 LAB — CBC
Hematocrit: 39.5 % (ref 37.5–51.0)
Hemoglobin: 12.6 g/dL — ABNORMAL LOW (ref 13.0–17.7)
MCH: 28.9 pg (ref 26.6–33.0)
MCHC: 31.9 g/dL (ref 31.5–35.7)
MCV: 91 fL (ref 79–97)
Platelets: 503 10*3/uL — ABNORMAL HIGH (ref 150–379)
RBC: 4.36 x10E6/uL (ref 4.14–5.80)
RDW: 16 % — AB (ref 12.3–15.4)
WBC: 7.2 10*3/uL (ref 3.4–10.8)

## 2016-11-12 LAB — COMPREHENSIVE METABOLIC PANEL
ALBUMIN: 4.2 g/dL (ref 3.5–5.5)
ALK PHOS: 93 IU/L (ref 39–117)
ALT: 12 IU/L (ref 0–44)
AST: 17 IU/L (ref 0–40)
Albumin/Globulin Ratio: 1.6 (ref 1.2–2.2)
BUN / CREAT RATIO: 8 — AB (ref 9–20)
BUN: 6 mg/dL (ref 6–24)
CALCIUM: 9.6 mg/dL (ref 8.7–10.2)
CHLORIDE: 103 mmol/L (ref 96–106)
CO2: 24 mmol/L (ref 18–29)
CREATININE: 0.74 mg/dL — AB (ref 0.76–1.27)
GFR calc Af Amer: 126 mL/min/{1.73_m2} (ref >59)
GFR calc non Af Amer: 109 mL/min/{1.73_m2} (ref >59)
GLOBULIN, TOTAL: 2.6 g/dL (ref 1.5–4.5)
Glucose: 80 mg/dL (ref 65–99)
Potassium: 4.1 mmol/L (ref 3.5–5.2)
SODIUM: 145 mmol/L — AB (ref 134–144)
Total Protein: 6.8 g/dL (ref 6.0–8.5)

## 2016-11-14 ENCOUNTER — Telehealth: Payer: Self-pay | Admitting: Family Medicine

## 2016-11-14 ENCOUNTER — Encounter: Payer: Self-pay | Admitting: Family Medicine

## 2016-11-14 NOTE — Telephone Encounter (Signed)
Pt's lab consistent with elevated platelet, this has been elevated in the past. I suspect its an acute phase reactant. Attempted to call pt to discuss. No answer.  Will send letter for him to f/u with repeat labs.  Archie Patten, MD [facility] Resident  11/14/2016, 1:11 PM

## 2016-11-20 ENCOUNTER — Telehealth: Payer: Self-pay | Admitting: Psychology

## 2016-11-20 ENCOUNTER — Ambulatory Visit: Payer: Medicaid Other | Admitting: Psychology

## 2016-11-20 NOTE — Telephone Encounter (Signed)
Patient missed appointment.  Called to check in.  Left a VM.

## 2016-11-28 ENCOUNTER — Telehealth: Payer: Self-pay | Admitting: Family Medicine

## 2016-11-28 NOTE — Telephone Encounter (Signed)
Has appt may 22 and would like enough pain meds to last until then.  Please advise

## 2016-11-29 ENCOUNTER — Other Ambulatory Visit: Payer: Self-pay | Admitting: Family Medicine

## 2016-11-29 ENCOUNTER — Telehealth: Payer: Self-pay | Admitting: Psychology

## 2016-11-29 MED ORDER — OXYCODONE-ACETAMINOPHEN 10-325 MG PO TABS: 1.0000 | ORAL_TABLET | ORAL | 0 refills | Status: DC | PRN

## 2016-11-29 NOTE — Telephone Encounter (Signed)
Pt calling again, needs pain meds refilled. Does not have enough for the weekend. Ottis Stain, CMA

## 2016-11-29 NOTE — Telephone Encounter (Signed)
Please inform patient

## 2016-11-29 NOTE — Telephone Encounter (Signed)
Pt informed. Deseree Blount, CMA  

## 2016-11-29 NOTE — Telephone Encounter (Signed)
Rx up front. Only 22 tabs given. This should sustain the next 11 days.

## 2016-11-29 NOTE — Telephone Encounter (Signed)
Pt called again about his pain meds. He now has 3 pills left and doesn't think he can make it thru the weekend.  Please advise

## 2016-11-29 NOTE — Telephone Encounter (Signed)
Please call pt when pain meds are ready for pick up. [name], CMA

## 2016-11-29 NOTE — Telephone Encounter (Signed)
[name] left a VM after his missed [facility] appointment last week.  I called him back to discuss. He was busy and will call back when able.

## 2016-11-29 NOTE — Telephone Encounter (Signed)
Connected with patient.  He has been in bed the past two days with significant pain.  Up to take a shower today.  Missed last [name] appointment.  Rescheduled for 5/16.

## 2016-12-04 ENCOUNTER — Ambulatory Visit (INDEPENDENT_AMBULATORY_CARE_PROVIDER_SITE_OTHER): Payer: Medicaid Other | Admitting: Psychology

## 2016-12-04 ENCOUNTER — Other Ambulatory Visit: Payer: Self-pay | Admitting: Family Medicine

## 2016-12-04 DIAGNOSIS — F3162 Bipolar disorder, current episode mixed, moderate: Secondary | ICD-10-CM

## 2016-12-04 MED ORDER — ONDANSETRON HCL 8 MG PO TABS: 8.0000 mg | ORAL_TABLET | Freq: Three times a day (TID) | ORAL | 1 refills | Status: DC | PRN

## 2016-12-04 NOTE — Patient Instructions (Addendum)
Good luck with your surgery [name].  I am hoping this really helps your pain.  Please schedule a follow-up appointment for:  July 18th at 10:00.  Call me at [phone] as needed before.    Dr. Tammi Klippel recommended you add a scoop of Whey Protein to your Carnation drinks.  Getting protein in may help set you up for better healing after your surgery.    He also recommended that you take your Zofran more regularly to help with the nausea.  You could experiment with taking it three times a day for a day or two to see if that might help.

## 2016-12-04 NOTE — Progress Notes (Signed)
Nausea interfering with adequate nutrition pre-surgery. Carnation Breakfast 3 daily with sporadic protein otherwise is below need.  Take TID on clock to see if nausea control can support nutrition.

## 2016-12-04 NOTE — Progress Notes (Signed)
Reason for follow-up:  [name] presents for continued mood management in the context of chronic pain.  Issues discussed:  He continues to have significant pain daily interfering with function.  He has trouble eating and appears to have lost more weight.  He states when he does eat during the day, he is awake and vomiting between 2-4 am.  He does not take Zofran very often because by the time he is awakened, "it's too late."  Smoking marijuana about three times a week to combat nausea and stimulate appetite.  He states it helps.  Denies other use of drugs or alcohol.  Smokes about 2ppd of cigarettes.    Continues with Latuda, Buspar (15 mg), and Diazepam.

## 2016-12-04 NOTE — Assessment & Plan Note (Addendum)
Difficult to tease out mood symptoms from pain especially as related to function.  His report of symptoms does not seem significantly different despite feeling anxious about upcoming surgery and having regular difficulty with sleep and vomiting.  No change in medicines today.  Emphasized the importance of sufficient nutrition for healing.  Not interested in changing tobacco use.  Discussed THC use.  He thinks it help his symptoms.    Dr. Tammi Klippel made some recommendations around using the Zofran to hopefully decrease his nausea and vomiting.    Would like to see back sooner but don't have anything until July.  Asked him to call to keep me posted.  See patient instructions for further plan.

## 2016-12-10 ENCOUNTER — Encounter: Payer: Self-pay | Admitting: Gastroenterology

## 2016-12-10 ENCOUNTER — Ambulatory Visit (INDEPENDENT_AMBULATORY_CARE_PROVIDER_SITE_OTHER): Payer: Medicaid Other | Admitting: Gastroenterology

## 2016-12-10 ENCOUNTER — Ambulatory Visit: Payer: Self-pay | Admitting: Family Medicine

## 2016-12-10 VITALS — BP 120/70 | HR 104 | Ht 69.5 in | Wt 155.4 lb

## 2016-12-10 DIAGNOSIS — R112 Nausea with vomiting, unspecified: Secondary | ICD-10-CM | POA: Diagnosis not present

## 2016-12-10 DIAGNOSIS — K219 Gastro-esophageal reflux disease without esophagitis: Secondary | ICD-10-CM

## 2016-12-10 DIAGNOSIS — R1084 Generalized abdominal pain: Secondary | ICD-10-CM

## 2016-12-10 MED ORDER — PANTOPRAZOLE SODIUM 40 MG PO TBEC: 40.0000 mg | DELAYED_RELEASE_TABLET | Freq: Two times a day (BID) | ORAL | 11 refills | Status: DC

## 2016-12-10 NOTE — Progress Notes (Signed)
    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 He has a defect in his midline abdominal incision and a repeat operation is planned on May 31 by Dr. Marlou Starks. He relates almost daily N/V, generally occurring between 2-4 am for 4 months. He is unable  to eat more than small amount of food or vomiting occurs. He states he has not been able to gain weight however his weight is stable. He has abdominal pain, primarily upper abdomen, daily not related to food or bowel movements. He discontinued recommended pantoprazole several months ago for unclear reasons. Last CT scan below. CMP and CBC in 10/2016 showed Na=145, Hb=12.6, plts=503k. Denies weight loss, constipation, diarrhea, change in stool caliber, melena, hematochezia,dysphagia, chest pain.  Abd/pelvic CT 12/239/2017 IMPRESSION: 1. No acute findings within the abdomen or pelvis. No evidence of bowel obstruction or bowel inflammation. 2. No hernia.  Current Medications, Allergies, Past Medical History, Past Surgical History, Family History and Social History were reviewed in [employer] record.  Physical Exam: General: Well developed, well nourished, no acute distress Head: Normocephalic and atraumatic Eyes:  sclerae anicteric, EOMI Ears: Normal auditory acuity Mouth: No deformity or lesions Lungs: Clear throughout to auscultation Heart: Regular rate and rhythm; no murmurs, rubs or bruits Abdomen: Soft, generalized tenderness and non distended. Midline incision with bandage over the middle portion of incision, not removed. No masses, hepatosplenomegaly or hernias noted. Normal Bowel sounds Rectal: not done Musculoskeletal: Symmetrical with no gross deformities  Pulses:  Normal pulses noted Extremities: No clubbing, cyanosis, edema or deformities noted Neurological:  Alert oriented x 4, grossly nonfocal Psychological:  Alert and cooperative. Normal mood and affect  Assessment and Recommendations:  1. Abdominal pain more prominent in the upper abdomen, early satiety, frequent nausea/vomiting. Suspected GERD. Rule out esophagitis, ulcer, partial gastric outlet obstruction, partial small bowel obstruction, painful adhesions. Resume pantoprazole 40 mg twice a day and follow all antireflux measures. Schedule abdominal/pelvic CT. Schedule EGD. The risks (including bleeding, perforation, infection, missed lesions, medication reactions and possible hospitalization or surgery if complications occur), benefits, and alternatives to endoscopy with possible biopsy and possible dilation were discussed with the patient and they consent to proceed.

## 2016-12-10 NOTE — Patient Instructions (Addendum)
We have sent the following medications to your pharmacy for you to pick up at your convenience: Protonix.   You have been scheduled for a CT scan of the abdomen and pelvis at [facility] ([address]---this is in the same building as [facility]).   You are scheduled on 12/12/16 at 4:00pm. You should arrive 15 minutes prior to your appointment time for registration. Please follow the written instructions below on the day of your exam:  WARNING: IF YOU ARE ALLERGIC TO IODINE/X-RAY DYE, PLEASE NOTIFY RADIOLOGY IMMEDIATELY AT [phone]! YOU WILL BE GIVEN A 13 HOUR PREMEDICATION PREP.  1) Do not eat or drink anything after 12:00pm (4 hours prior to your test) 2) You have been given 2 bottles of oral contrast to drink. The solution may taste               better if refrigerated, but do NOT add ice or any other liquid to this solution. Shake             well before drinking.    Drink 1 bottle of contrast @ 2:00pm (2 hours prior to your exam)  Drink 1 bottle of contrast @ 3:00pm (1 hour prior to your exam)  You may take any medications as prescribed with a small amount of water except for the following: Metformin, Glucophage, Glucovance, Avandamet, Riomet, Fortamet, Actoplus Met, Janumet, Glumetza or Metaglip. The above medications must be held the day of the exam AND 48 hours after the exam.  The purpose of you drinking the oral contrast is to aid in the visualization of your intestinal tract. The contrast solution may cause some diarrhea. Before your exam is started, you will be given a small amount of fluid to drink. Depending on your individual set of symptoms, you may also receive an intravenous injection of x-ray contrast/dye. Plan on being at [facility] for 30 minutes or longer, depending on the type of exam you are having performed.  This test typically takes 30-45 minutes to complete.  If you have any questions regarding your exam or if you need to reschedule,  you may call the CT department at [phone] between the hours of 8:00 am and 5:00 pm, Monday-Friday.  ________________________________________________________________________  Dennis Bast have been scheduled for an endoscopy. Please follow written instructions given to you at your visit today. If you use inhalers (even only as needed), please bring them with you on the day of your procedure. Your physician has requested that you go to www.startemmi.com and enter the access code given to you at your visit today. This web site gives a general overview about your procedure. However, you should still follow specific instructions given to you by our office regarding your preparation for the procedure.  Patient advised to avoid spicy, acidic, citrus, chocolate, mints, fruit and fruit juices.  Limit the intake of caffeine, alcohol and Soda.  Don't exercise too soon after eating.  Don't lie down within 3-4 hours of eating.  Elevate the head of your bed.  Thank you for choosing me and [facility].  Pricilla Riffle. Dagoberto Ligas., MD., [facility] Regal

## 2016-12-11 ENCOUNTER — Encounter (HOSPITAL_COMMUNITY): Payer: Self-pay

## 2016-12-11 ENCOUNTER — Encounter (HOSPITAL_COMMUNITY)
Admission: RE | Admit: 2016-12-11 | Discharge: 2016-12-11 | Disposition: A | Discharge status: Home / Self Care | Payer: Medicaid Other | Source: Ambulatory Visit | Attending: General Surgery | Admitting: General Surgery

## 2016-12-11 DIAGNOSIS — Z01812 Encounter for preprocedural laboratory examination: Secondary | ICD-10-CM | POA: Insufficient documentation

## 2016-12-11 HISTORY — DX: Dyspnea, unspecified: R06.00

## 2016-12-11 HISTORY — DX: Bipolar disorder, unspecified: F31.9

## 2016-12-11 HISTORY — DX: Papillomavirus as the cause of diseases classified elsewhere: B97.7

## 2016-12-11 LAB — BASIC METABOLIC PANEL
ANION GAP: 10 (ref 5–15)
BUN: 6 mg/dL (ref 6–20)
CALCIUM: 8.9 mg/dL (ref 8.9–10.3)
CO2: 21 mmol/L — AB (ref 22–32)
CREATININE: 0.84 mg/dL (ref 0.61–1.24)
Chloride: 108 mmol/L (ref 101–111)
GFR calc Af Amer: >60 mL/min (ref >60)
Glucose, Bld: 86 mg/dL (ref 65–99)
Potassium: 3.6 mmol/L (ref 3.5–5.1)
Sodium: 139 mmol/L (ref 135–145)

## 2016-12-11 LAB — CBC
HCT: 38.4 % — ABNORMAL LOW (ref 39.0–52.0)
Hemoglobin: 11.9 g/dL — ABNORMAL LOW (ref 13.0–17.0)
MCH: 28.2 pg (ref 26.0–34.0)
MCHC: 31 g/dL (ref 30.0–36.0)
MCV: 91 fL (ref 78.0–100.0)
Platelets: 465 10*3/uL — ABNORMAL HIGH (ref 150–400)
RBC: 4.22 MIL/uL (ref 4.22–5.81)
RDW: 14.4 % (ref 11.5–15.5)
WBC: 10.9 10*3/uL — ABNORMAL HIGH (ref 4.0–10.5)

## 2016-12-11 NOTE — Progress Notes (Signed)
PCP:Dr. McKeg @ [facility] Cardiology: none  Endocrinologist: Dr. Fuller Plan

## 2016-12-11 NOTE — Pre-Procedure Instructions (Addendum)
Darryl Willner  12/11/2016      [facility] [place], [address] [place] [address] Phone: [phone] Fax: [phone]    Your procedure is scheduled on thurs. May 31  Report to [facility] Admitting at 5:30 A.M.  Call this number if you have problems the morning of surgery:  [phone]   Remember:  Do not eat food or drink liquids after midnight  Except  At 3:30 am drink boost breeze   Take these medicines the morning of surgery with A SIP OF WATER : tylenol or oxycodone if needed, albuterol inhaler if needed-bring to hospital, flexeril if needed, gabapentin, latuda, zofran if needed, protonix, valium             Stop aspirin, advil, motrin, ibuprofen , aleve, vitamins/herbal medicines.   Do not wear jewelry.  Do not wear lotions, powders, or perfumes, or deoderant.  Do not shave 48 hours prior to surgery.  Men may shave face and neck.  Do not bring valuables to the hospital.  [facility] is not responsible for any belongings or valuables.  Contacts, dentures or bridgework may not be worn into surgery.  Leave your suitcase in the car.  After surgery it may be brought to your room.  For patients admitted to the hospital, discharge time will be determined by your treatment team.  Patients discharged the day of surgery will not be allowed to drive home.    Special instructions:   [facility]- Preparing For Surgery  Before surgery, you can play an important role. Because skin is not sterile, your skin needs to be as free of germs as possible. You can reduce the number of germs on your skin by washing with CHG (chlorahexidine gluconate) Soap before surgery.  CHG is an antiseptic cleaner which kills germs and bonds with the skin to continue killing germs even after washing.  Please do not use if you have an allergy to CHG or antibacterial soaps. If your skin becomes  reddened/irritated stop using the CHG.  Do not shave (including legs and underarms) for at least 48 hours prior to first CHG shower. It is OK to shave your face.  Please follow these instructions carefully.   1. Shower the NIGHT BEFORE SURGERY and the MORNING OF SURGERY with CHG.   2. If you chose to wash your hair, wash your hair first as usual with your normal shampoo.  3. After you shampoo, rinse your hair and body thoroughly to remove the shampoo.  4. Use CHG as you would any other liquid soap. You can apply CHG directly to the skin and wash gently with a scrungie or a clean washcloth.   5. Apply the CHG Soap to your body ONLY FROM THE NECK DOWN.  Do not use on open wounds or open sores. Avoid contact with your eyes, ears, mouth and genitals (private parts). Wash genitals (private parts) with your normal soap.  6. Wash thoroughly, paying special attention to the area where your surgery will be performed.  7. Thoroughly rinse your body with warm water from the neck down.  8. DO NOT shower/wash with your normal soap after using and rinsing off the CHG Soap.  9. Pat yourself dry with a CLEAN TOWEL.   10. Wear CLEAN PAJAMAS   11. Place CLEAN SHEETS on your bed the night of your first shower and DO NOT  SLEEP WITH PETS.    Day of Surgery: Do not apply any deodorants/lotions. Please wear clean clothes to the hospital/surgery center.      Please read over the following fact sheets that you were given. Coughing and Deep Breathing and Surgical Site Infection Prevention

## 2016-12-12 ENCOUNTER — Ambulatory Visit (INDEPENDENT_AMBULATORY_CARE_PROVIDER_SITE_OTHER)
Admission: RE | Admit: 2016-12-12 | Discharge: 2016-12-12 | Disposition: A | Discharge status: Home / Self Care | Payer: Medicaid Other | Source: Ambulatory Visit | Attending: Gastroenterology | Admitting: Gastroenterology

## 2016-12-12 ENCOUNTER — Ambulatory Visit (INDEPENDENT_AMBULATORY_CARE_PROVIDER_SITE_OTHER): Payer: Medicaid Other | Admitting: Family Medicine

## 2016-12-12 ENCOUNTER — Encounter: Payer: Self-pay | Admitting: Family Medicine

## 2016-12-12 DIAGNOSIS — R1011 Right upper quadrant pain: Secondary | ICD-10-CM

## 2016-12-12 DIAGNOSIS — R1084 Generalized abdominal pain: Secondary | ICD-10-CM | POA: Diagnosis not present

## 2016-12-12 DIAGNOSIS — Z720 Tobacco use: Secondary | ICD-10-CM

## 2016-12-12 DIAGNOSIS — K219 Gastro-esophageal reflux disease without esophagitis: Secondary | ICD-10-CM | POA: Diagnosis not present

## 2016-12-12 DIAGNOSIS — R112 Nausea with vomiting, unspecified: Secondary | ICD-10-CM | POA: Diagnosis not present

## 2016-12-12 DIAGNOSIS — G8929 Other chronic pain: Secondary | ICD-10-CM

## 2016-12-12 MED ORDER — NICOTINE POLACRILEX 4 MG MT LOZG: 4.0000 mg | LOZENGE | OROMUCOSAL | 3 refills | Status: DC | PRN

## 2016-12-12 MED ORDER — IOPAMIDOL (ISOVUE-300) INJECTION 61%
100.0000 mL | Freq: Once | INTRAVENOUS | Status: AC | PRN
  Administered 2016-12-12: 100 mL via INTRAVENOUS

## 2016-12-12 MED ORDER — OXYCODONE-ACETAMINOPHEN 10-325 MG PO TABS: 1.0000 | ORAL_TABLET | ORAL | 0 refills | Status: DC | PRN

## 2016-12-12 MED ORDER — OXYCODONE-ACETAMINOPHEN 10-325 MG PO TABS: 1.0000 | ORAL_TABLET | Freq: Four times a day (QID) | ORAL | 0 refills | Status: DC | PRN

## 2016-12-12 MED ORDER — GABAPENTIN 300 MG PO CAPS: 600.0000 mg | ORAL_CAPSULE | Freq: Three times a day (TID) | ORAL | 2 refills | Status: DC

## 2016-12-12 NOTE — Assessment & Plan Note (Signed)
Patient and I had a long discussion about smoking cessation. I informed him that his tobacco use will have a direct effect on his body's ability to heal after surgery. Patient is currently a 2 pack-a-day smoker. Patient acknowledged this and is willing to attempt to reduce/discontinue smoking for this surgery as he stated he doesn't "want to go through this ever again". - Nicotine lozenges ordered. - Smoking cessation hotline information provided. - Will discuss with patient at postop follow-up.

## 2016-12-12 NOTE — Progress Notes (Signed)
HPI  CC: Chronic abdominal pain Patient is here for follow-up and preoperative visit for his chronic abdominal pain. Patient states that his abdominal pain has been stable recently but increased overall. He is still needing an increased amount of pain medication in order to tolerate this pain. His bowel movements have been at their baseline. The nausea and vomiting he has been experiencing recently has been stable as well but present on most days. Patient is followed by gastroenterology for this. As for patient's abdominal pain, patient is scheduled for surgery 1 week from today.  Patient is a long-time smoker. Currently 2 packs a day. Patient and I had a long discussion about the effects of smoking on wound healing. Patient was very appropriate with the discussion and advice provided.  Patient endorses stable nausea, vomiting, and abdominal pain. He denies any headache, blurred vision, dizziness, cough, shortness of breath, hematemesis, diarrhea, constipation, hematochezia, melena, weakness, numbness, or paresthesias.  Review of Systems See HPI for ROS.   CC, SH/smoking status, and VS noted  Objective: BP (!) 142/84   Pulse (!) 109   Temp 98.1 F (36.7 C) (Oral)   Ht 5\' 10"  (1.778 m)   Wt 158 lb (71.7 kg)   BMI 22.67 kg/m  Gen: NAD, alert, cooperative, and pleasant. HEENT: NCAT, EOMI, PERRL, MMM CV: RRR, no murmur Resp: CTAB, no wheezes, non-labored Abd: SNTND, midline surgical scar notably open with serous drainage. No significant erythema or purulence suggesting infection. Site is very tender to the touch. Neuro: Alert and oriented, Speech clear, No gross deficits   Assessment and plan:  Abdominal pain, chronic, diffuse Stable: Patient's pain seems to be under decent control at this time. He is still requiring increased overall pain medication from where he was this time last year. His nausea and vomiting seem to be stable as well. Patient undergoes a EDG by gastroenterology  tomorrow. He also undergoes abdominal wall surgery by general surgery 1 week from today. - Percocet prescription refilled today. Patient was provided explicit instructions to only take medication when in pain. He was provided Percocet 10/325 tablets both for pain control and for postsurgical pain. For his postsurgical pain patient has been advised that he can take 1 tablet every 4 hours as needed, if necessary for 7 days after his surgery. Patient was able to repeat this plan and stated his understanding. - Patient to follow-up one week after surgery - Increase gabapentin from 600mg  twice a day to 600mg  3 times a day.  Tobacco abuse disorder Patient and I had a long discussion about smoking cessation. I informed him that his tobacco use will have a direct effect on his body's ability to heal after surgery. Patient is currently a 2 pack-a-day smoker. Patient acknowledged this and is willing to attempt to reduce/discontinue smoking for this surgery as he stated he doesn't "want to go through this ever again". - Nicotine lozenges ordered. - Smoking cessation hotline information provided. - Will discuss with patient at postop follow-up.   Meds ordered this encounter  Medications  . gabapentin (NEURONTIN) 300 MG capsule    Sig: Take 2 capsules (600 mg total) by mouth 3 (three) times daily.    Dispense:  180 capsule    Refill:  2  . DISCONTD: oxyCODONE-acetaminophen (PERCOCET) 10-325 MG tablet    Sig: Take 1 tablet by mouth every 4 (four) hours as needed for pain. For post-surgical pain.    Dispense:  42 tablet    Refill:  0  Do not fill until 12/17/16.  Timothy [name] oxyCODONE-acetaminophen (PERCOCET) 10-325 MG tablet    Sig: Take 1 tablet by mouth every 6 (six) hours as needed for pain. To be taken as needed now until surgery, then restart frequency 1 week after surgery.    Dispense:  75 tablet    Refill:  0    Do not fill until 12/12/16.  Timothy [name] nicotine polacrilex (NICOTINE MINI) 4 MG lozenge    Sig: Take  1 lozenge (4 mg total) by mouth as needed for smoking cessation.    Dispense:  300 tablet    Refill:  3     Elberta Leatherwood, MD,MS,  PGY3 12/12/2016 11:03 AM

## 2016-12-12 NOTE — Patient Instructions (Signed)
It was a pleasure seeing you today in our clinic. Today we discussed your abdominal pain and upcoming surgery. Here is the treatment plan we have discussed and agreed upon together:   - I have provided due to prescriptions for pain medications. - The first prescription is for Percocet and contains 75 tablets. You can begin taking this medication now and until the day of surgery. Then restart taking this medication one week after surgery. - The second prescription is also for Percocet and contains 42 tablets. You'll notice that the frequency is set for you to take UP TO one tablet every 4 hours. This is only as needed. If you are NOT experiencing significant pain then DO NOT take this medication every 4 hours. - I would like to see you back 1 week after surgery.  I have also prescribed to some nicotine replacement. The nicotine lozenges I have provided you should help with the cravings you experience. I have also provided you a number for a support hotline, call this number anytime you are having significant urges. - It is incredibly important that you stop smoking completely, or at least cut back as much as possible. Smoking has a direct effect on wound healing, and any smoking can cause surgical complications down the road.

## 2016-12-12 NOTE — Assessment & Plan Note (Addendum)
Stable: Patient's pain seems to be under decent control at this time. He is still requiring increased overall pain medication from where he was this time last year. His nausea and vomiting seem to be stable as well. Patient undergoes a EDG by gastroenterology tomorrow. He also undergoes abdominal wall surgery by general surgery 1 week from today. - Percocet prescription refilled today. Patient was provided explicit instructions to only take medication when in pain. He was provided Percocet 10/325 tablets both for pain control and for postsurgical pain. For his postsurgical pain patient has been advised that he can take 1 tablet every 4 hours as needed, if necessary for 7 days after his surgery. Patient was able to repeat this plan and stated his understanding. - Patient to follow-up one week after surgery - Increase gabapentin from 600mg  twice a day to 600mg  3 times a day.

## 2016-12-13 ENCOUNTER — Encounter: Payer: Self-pay | Admitting: Gastroenterology

## 2016-12-13 ENCOUNTER — Telehealth: Payer: Self-pay | Admitting: *Deleted

## 2016-12-13 ENCOUNTER — Ambulatory Visit (AMBULATORY_SURGERY_CENTER): Payer: Medicaid Other | Admitting: Gastroenterology

## 2016-12-13 VITALS — BP 120/85 | HR 89 | Temp 98.7°F | Resp 22 | Ht 69.5 in | Wt 155.0 lb

## 2016-12-13 DIAGNOSIS — R112 Nausea with vomiting, unspecified: Secondary | ICD-10-CM

## 2016-12-13 DIAGNOSIS — K263 Acute duodenal ulcer without hemorrhage or perforation: Secondary | ICD-10-CM | POA: Diagnosis not present

## 2016-12-13 DIAGNOSIS — R933 Abnormal findings on diagnostic imaging of other parts of digestive tract: Secondary | ICD-10-CM

## 2016-12-13 MED ORDER — SODIUM CHLORIDE 0.9 % IV SOLN
500.0000 mL | INTRAVENOUS | Status: DC
Start: 2016-12-13 — End: 2017-10-02

## 2016-12-13 NOTE — Progress Notes (Signed)
Report to PACU, RN, vss, BBS= Clear.  

## 2016-12-13 NOTE — Telephone Encounter (Signed)
Pt calling to speak to dr Alease Frame about procedure he just had. Hilton Saephan Kennon Holter, CMA

## 2016-12-13 NOTE — Progress Notes (Signed)
Called to room to assist during endoscopic procedure.  Patient ID and intended procedure confirmed with present staff. Received instructions for my participation in the procedure from the performing physician.  

## 2016-12-13 NOTE — Patient Instructions (Signed)
YOU HAD AN ENDOSCOPIC PROCEDURE TODAY AT [facility]:   Refer to the procedure report that was given to you for any specific questions about what was found during the examination.  If the procedure report does not answer your questions, please call your gastroenterologist to clarify.  If you requested that your care partner not be given the details of your procedure findings, then the procedure report has been included in a sealed envelope for you to review at your convenience later.  YOU SHOULD EXPECT: Some feelings of bloating in the abdomen. Passage of more gas than usual.  Walking can help get rid of the air that was put into your GI tract during the procedure and reduce the bloating. If you had a lower endoscopy (such as a colonoscopy or flexible sigmoidoscopy) you may notice spotting of blood in your stool or on the toilet paper. If you underwent a bowel prep for your procedure, you may not have a normal bowel movement for a few days.  Please Note:  You might notice some irritation and congestion in your nose or some drainage.  This is from the oxygen used during your procedure.  There is no need for concern and it should clear up in a day or so.  SYMPTOMS TO REPORT IMMEDIATELY:   Following upper endoscopy (EGD)  Vomiting of blood or coffee ground material  New chest pain or pain under the shoulder blades  Painful or persistently difficult swallowing  New shortness of breath  Fever of 100F or higher  Black, tarry-looking stools  For urgent or emergent issues, a gastroenterologist can be reached at any hour by calling [phone].   DIET:  Full Liquid diet for 1-2 weeks, advance to soft diet as tolerated. Drink plenty of fluids but you should avoid alcoholic beverages for 24 hours.  MEDICATIONS:  Continue present medications including Pantoprazole 40 mg by mouth twice daily. No Aspirin, Ibuprofen, or other non-steroidal anti-inflammatory drugs.  Please see handouts  given to you by your recovery nurse today.  Return to GI office in 1 month.  Patient to call and follow-up with Dr. Jene Every to discuss postponing surgery planned for next week. Dr. Fuller Plan will be sending Dr. Jene Every a report of the procedure done today.  ACTIVITY:  You should plan to take it easy for the rest of today and you should NOT DRIVE or use heavy machinery until tomorrow (because of the sedation medicines used during the test).    FOLLOW UP: Our staff will call the number listed on your records the next business day following your procedure to check on you and address any questions or concerns that you may have regarding the information given to you following your procedure. If we do not reach you, we will leave a message.  However, if you are feeling well and you are not experiencing any problems, there is no need to return our call.  We will assume that you have returned to your regular daily activities without incident.  If any biopsies were taken you will be contacted by phone or by letter within the next 1-3 weeks.  Please call us at [phone] if you have not heard about the biopsies in 3 weeks.   Thank you for allowing [place] to provide for your healthcare needs today.   SIGNATURES/CONFIDENTIALITY: You and/or your care partner have signed paperwork which will be entered into your electronic medical record.  These signatures attest to the fact that that the information  above on your After Visit Summary has been reviewed and is understood.  Full responsibility of the confidentiality of this discharge information lies with you and/or your care-partner. 

## 2016-12-13 NOTE — Op Note (Signed)
[place] Patient Name: Timothy [name] Procedure Date: 12/13/2016 2:34 PM MRN: [id number] Endoscopist: Ladene Artist , MD Age: 51 Referring MD:  Date of Birth: 08-04-1965 Gender: Male Account #: [id number] Procedure:                Upper GI endoscopy Indications:              Abnormal CT of the GI tract, Nausea with vomiting Medicines:                Monitored Anesthesia Care Procedure:                Pre-Anesthesia Assessment:                           - Prior to the procedure, a History and Physical                            was performed, and patient medications and                            allergies were reviewed. The patient's tolerance of                            previous anesthesia was also reviewed. The risks                            and benefits of the procedure and the sedation                            options and risks were discussed with the patient.                            All questions were answered, and informed consent                            was obtained. Prior Anticoagulants: The patient has                            taken no previous anticoagulant or antiplatelet                            agents. ASA Grade Assessment: II - A patient with                            mild systemic disease. After reviewing the risks                            and benefits, the patient was deemed in                            satisfactory condition to undergo the procedure.                           After obtaining informed consent, the endoscope was  passed under direct vision. Throughout the                            procedure, the patient's blood pressure, pulse, and                            oxygen saturations were monitored continuously. The                            Endoscope was introduced through the mouth, and                            advanced to the third part of duodenum. The upper                            GI  endoscopy was accomplished without difficulty.                            The patient tolerated the procedure well. Scope In: Scope Out: Findings:                 LA Grade B (one or more mucosal breaks greater than                            5 mm, not extending between the tops of two mucosal                            folds) esophagitis with no bleeding was found in                            the distal esophagus.                           The exam of the esophagus was otherwise normal.                           The entire examined stomach was normal. Biopsies                            were taken with a cold forceps for histology.                           One non-bleeding cratered duodenal ulcer with no                            stigmata of bleeding was found in the duodenal                            bulb. The lesion was 7 mm in largest dimension.                           An acquired benign-appearing, intrinsic moderate  stenosis was found in the duodenal bulb at the site                            of the ulcer and was traversed.                           A mild extrinsic deformity was found in the third                            portion of the duodenum. Slightly narrowed,                            appeared to be extrinsic.                           The second portion of the duodenum was normal. Complications:            No immediate complications. Estimated Blood Loss:     Estimated blood loss was minimal. Impression:               - LA Grade B reflux esophagitis.                           - Normal stomach. Biopsied.                           - One non-bleeding duodenal ulcer with no stigmata                            of bleeding.                           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                           - Duodenal deformity.                           - Normal second portion of the  duodenum. Recommendation:           - Patient has a contact number available for                            emergencies. The signs and symptoms of potential                            delayed complications were discussed with the                            patient. Return to normal activities tomorrow.                            Written discharge instructions were provided to the  patient.                           - Full liquid diet for 1-2 weeks, advance to soft                            diet as tolerated.                           - Continue present medications including                            pantoprazole 40 mg po bid.                           - Await pathology results.                           - Return to GI office in 1 month.                           - No ASA, NSAID products. Ladene Artist, MD 12/13/2016 2:55:20 PM This report has been signed electronically.

## 2016-12-15 ENCOUNTER — Other Ambulatory Visit: Payer: Self-pay | Admitting: Internal Medicine

## 2016-12-15 ENCOUNTER — Telehealth: Payer: Self-pay | Admitting: Student

## 2016-12-15 DIAGNOSIS — R0602 Shortness of breath: Secondary | ICD-10-CM

## 2016-12-15 DIAGNOSIS — J441 Chronic obstructive pulmonary disease with (acute) exacerbation: Secondary | ICD-10-CM

## 2016-12-15 MED ORDER — ALBUTEROL SULFATE HFA 108 (90 BASE) MCG/ACT IN AERS: 2.0000 | INHALATION_SPRAY | Freq: Four times a day (QID) | RESPIRATORY_TRACT | 1 refills | Status: DC | PRN

## 2016-12-15 NOTE — Telephone Encounter (Signed)
Called for refill on his albuterol on the after hour pager. Sent his refill to his pharmacy. Advised him to call during the normal working hours for future refill requests.

## 2016-12-17 ENCOUNTER — Telehealth: Payer: Self-pay | Admitting: *Deleted

## 2016-12-17 ENCOUNTER — Telehealth: Payer: Self-pay | Admitting: Family Medicine

## 2016-12-17 ENCOUNTER — Telehealth: Payer: Self-pay

## 2016-12-17 NOTE — Telephone Encounter (Signed)
  Follow up Call-  Call back number 12/13/2016  Post procedure Call Back phone  # [phone]  Permission to leave phone message Yes  Some recent data might be hidden     Patient questions:  Do you have a fever, pain , or abdominal swelling? No. Pain Score  0 *  Have you tolerated food without any problems? Yes.    Have you been able to return to your normal activities? Yes.    Do you have any questions about your discharge instructions: Diet   No. Medications  No. Follow up visit  No.  Do you have questions or concerns about your Care? No.  Actions: * If pain score is 4 or above: No action needed, pain <4.  No problems noted per pt. maw

## 2016-12-17 NOTE — Telephone Encounter (Signed)
No conversation attached to this message. What was this about?

## 2016-12-17 NOTE — Telephone Encounter (Signed)
PA form placed in provider box for review.  Derl Barrow, RN

## 2016-12-17 NOTE — Telephone Encounter (Signed)
2nd request. Pt would really like to get this filled today. ep

## 2016-12-17 NOTE — Telephone Encounter (Signed)
  Follow up Call-  Call back number 12/13/2016  Post procedure Call Back phone  # [phone]  Permission to leave phone message Yes  Some recent data might be hidden     No answer at # given.  Left message on VM.

## 2016-12-17 NOTE — Telephone Encounter (Signed)
Pt states the pharmacy needs prior authorization on oxycodone , the medication pt needs for surgery on Thursday. Pt uses [facility] on [address]. ep

## 2016-12-18 NOTE — Telephone Encounter (Signed)
Called Pharm/insurance Tuesday afternoon >> at that time it appears as though the problem had already been solved.

## 2016-12-19 ENCOUNTER — Encounter (HOSPITAL_COMMUNITY): Admission: RE | Payer: Self-pay | Source: Ambulatory Visit

## 2016-12-19 ENCOUNTER — Ambulatory Visit (HOSPITAL_COMMUNITY): Admission: RE | Admit: 2016-12-19 | Payer: Medicaid Other | Source: Ambulatory Visit | Admitting: General Surgery

## 2016-12-19 SURGERY — WOUND EXPLORATION
Anesthesia: General

## 2016-12-20 ENCOUNTER — Telehealth: Payer: Self-pay | Admitting: Gastroenterology

## 2016-12-20 ENCOUNTER — Encounter (HOSPITAL_COMMUNITY): Payer: Self-pay

## 2016-12-20 ENCOUNTER — Emergency Department (HOSPITAL_COMMUNITY)
Admission: EM | Admit: 2016-12-20 | Discharge: 2016-12-20 | Discharge status: Against Medical Advice | Payer: Medicaid Other | Attending: Emergency Medicine | Admitting: Emergency Medicine

## 2016-12-20 DIAGNOSIS — K625 Hemorrhage of anus and rectum: Secondary | ICD-10-CM | POA: Diagnosis not present

## 2016-12-20 DIAGNOSIS — R1084 Generalized abdominal pain: Secondary | ICD-10-CM | POA: Diagnosis not present

## 2016-12-20 DIAGNOSIS — Z5321 Procedure and treatment not carried out due to patient leaving prior to being seen by health care provider: Secondary | ICD-10-CM | POA: Insufficient documentation

## 2016-12-20 LAB — COMPREHENSIVE METABOLIC PANEL
ALBUMIN: 3.6 g/dL (ref 3.5–5.0)
ALT: 11 U/L — ABNORMAL LOW (ref 17–63)
ANION GAP: 13 (ref 5–15)
AST: 20 U/L (ref 15–41)
Alkaline Phosphatase: 100 U/L (ref 38–126)
BUN: 21 mg/dL — ABNORMAL HIGH (ref 6–20)
CHLORIDE: 101 mmol/L (ref 101–111)
CO2: 17 mmol/L — AB (ref 22–32)
Calcium: 9.5 mg/dL (ref 8.9–10.3)
Creatinine, Ser: 2.16 mg/dL — ABNORMAL HIGH (ref 0.61–1.24)
GFR calc Af Amer: 40 mL/min — ABNORMAL LOW (ref >60)
GFR calc non Af Amer: 34 mL/min — ABNORMAL LOW (ref >60)
Glucose, Bld: 123 mg/dL — ABNORMAL HIGH (ref 65–99)
POTASSIUM: 4.9 mmol/L (ref 3.5–5.1)
SODIUM: 131 mmol/L — AB (ref 135–145)
Total Bilirubin: 0.6 mg/dL (ref 0.3–1.2)
Total Protein: 7.3 g/dL (ref 6.5–8.1)

## 2016-12-20 LAB — CBC
HEMATOCRIT: 39.9 % (ref 39.0–52.0)
HEMOGLOBIN: 12.8 g/dL — AB (ref 13.0–17.0)
MCH: 27.8 pg (ref 26.0–34.0)
MCHC: 32.1 g/dL (ref 30.0–36.0)
MCV: 86.6 fL (ref 78.0–100.0)
Platelets: 525 10*3/uL — ABNORMAL HIGH (ref 150–400)
RBC: 4.61 MIL/uL (ref 4.22–5.81)
RDW: 13.5 % (ref 11.5–15.5)
WBC: 17.9 10*3/uL — ABNORMAL HIGH (ref 4.0–10.5)

## 2016-12-20 LAB — TYPE AND SCREEN
ABO/RH(D): O POS
ANTIBODY SCREEN: NEGATIVE

## 2016-12-20 NOTE — ED Triage Notes (Signed)
Pt complaining of blood in stool. Pt states had endoscopy 1 week ago. Pt states found ulcer, started on protonix. Pt also complaining of abdominal pain. Pt states 2 episodes of bloody diarrhea today.

## 2016-12-20 NOTE — ED Notes (Signed)
Pt called for assigned room; pt did not respond to calls

## 2016-12-20 NOTE — ED Notes (Signed)
Pt family inquired about wait time; pt and family was updated on wait time and assured that Nurse first has reviewed labs with no acute findings; family concerned pt may be "bleeding out"; No signs of hemorrhaging and Hemoglobin WNL.

## 2016-12-20 NOTE — Telephone Encounter (Signed)
Patient reports that starting Wed night and all day yesterday he has had blood in is his stools.  Stools are bloody, when questioned on how much bleeding he has had, all he can describe is " a lot".  Patient states he has a HA, he is light headed, and he sees spots in front of his eyes, "I really don't feel well".  He is advised to go to the ED immediately and not to drive.  He wants to go home and shower.  I instructed him to not go home and go to the ED immediately. He has someone that will drive him to the ED. He verbalized understanding to go straight to the ED and not drive.

## 2016-12-20 NOTE — Telephone Encounter (Signed)
Agree with emergent ED evaluation.

## 2016-12-21 ENCOUNTER — Encounter (HOSPITAL_COMMUNITY): Payer: Self-pay

## 2016-12-21 ENCOUNTER — Emergency Department (HOSPITAL_COMMUNITY)
Admission: EM | Admit: 2016-12-21 | Discharge: 2016-12-21 | Disposition: A | Discharge status: Home / Self Care | Payer: Medicaid Other | Attending: Dermatology | Admitting: Dermatology

## 2016-12-21 DIAGNOSIS — R197 Diarrhea, unspecified: Secondary | ICD-10-CM | POA: Diagnosis present

## 2016-12-21 NOTE — ED Triage Notes (Signed)
Pt had endoscopy on last Friday.  Has ulcer in stomach.  Pt was supposed Thursday for mesh repair.  Pt now with diarrhea and blood in stool since Wednesday.

## 2016-12-21 NOTE — ED Notes (Signed)
Called for pt x 3. Not found in lobby. Pt left yesterday d/t wait times also.

## 2016-12-21 NOTE — ED Notes (Signed)
I called patient name several times in the lobby to collect labs and no one responded

## 2016-12-22 ENCOUNTER — Encounter: Payer: Self-pay | Admitting: Gastroenterology

## 2016-12-26 ENCOUNTER — Ambulatory Visit: Payer: Medicaid Other | Admitting: Family Medicine

## 2017-01-01 ENCOUNTER — Ambulatory Visit: Payer: Self-pay | Admitting: Gastroenterology

## 2017-01-16 ENCOUNTER — Ambulatory Visit (INDEPENDENT_AMBULATORY_CARE_PROVIDER_SITE_OTHER): Payer: Medicaid Other | Admitting: Family Medicine

## 2017-01-16 ENCOUNTER — Encounter: Payer: Self-pay | Admitting: Family Medicine

## 2017-01-16 DIAGNOSIS — R1011 Right upper quadrant pain: Secondary | ICD-10-CM | POA: Diagnosis not present

## 2017-01-16 DIAGNOSIS — K279 Peptic ulcer, site unspecified, unspecified as acute or chronic, without hemorrhage or perforation: Secondary | ICD-10-CM | POA: Diagnosis not present

## 2017-01-16 DIAGNOSIS — G8929 Other chronic pain: Secondary | ICD-10-CM | POA: Diagnosis not present

## 2017-01-16 MED ORDER — OXYCODONE-ACETAMINOPHEN 10-325 MG PO TABS: 1.0000 | ORAL_TABLET | Freq: Four times a day (QID) | ORAL | 0 refills | Status: DC | PRN

## 2017-01-16 NOTE — Assessment & Plan Note (Signed)
Patient was recently diagnosed with a duodenal ulcer by GI. At the time the site was bleeding. He denies any hematochezia or melena this time. He is currently being followed by gastroenterology. He is some discussion about additional need for surgery, he is currently awaiting clearance by GI. - Encourage patient to set up close follow-up with GI early in July

## 2017-01-16 NOTE — Progress Notes (Signed)
   HPI  CC: Chronic abdominal pain Patient is here to discuss his chronic abdominal pain. He states that he is been acutely worse over the past month. He has reported to be twice since our last visit. He is recently diagnosed with a duodenal ulcer and was placed on very specific diet regimen per GI. Patient versus good compliance with this diet regimen. He endorses no additional hematochezia but had had been experiencing this at the time of seeing GI. Pain continues to be an issue. Nausea and vomiting still present in a waxing and waning duration.  ROS: He denies any fever, chills, headache, blurry vision, lightheadedness, dizziness, diarrhea, numbness, or paresthesias.  CC, SH/smoking status, and VS noted  Objective: BP 110/60   Pulse 89   Temp 97.9 F (36.6 C) (Oral)   Ht 5' 9.5" (1.765 m)   Wt 154 lb 12.8 oz (70.2 kg)   SpO2 99%   BMI 22.53 kg/m  Gen: NAD, alert, cooperative, appears fatigued/worn out HEENT: NCAT, EOMI, PERRL, MMM CV: RRR, no murmur Resp: CTAB, no wheezes, non-labored Abd: S, moderate generalized/epigastric TTP, ND, BS present Ext: No edema, warm   Assessment and plan:  PUD (peptic ulcer disease) Patient was recently diagnosed with a duodenal ulcer by GI. At the time the site was bleeding. He denies any hematochezia or melena this time. He is currently being followed by gastroenterology. He is some discussion about additional need for surgery, he is currently awaiting clearance by GI. - Encourage patient to set up close follow-up with GI early in July  Abdominal pain, chronic, diffuse Patient is here to follow-up on his chronic abdominal pain. Is currently well-controlled on his current medication regimen however at our last visit there was hope that he may be able to reduce this regimen at this point in time. Unfortunately, his abdominal pain seems to be worse and I am not currently in favor of reducing this regimen. - I provided patient with 2 months of  medication. - I've asked patient to follow-up in one month with his new primary care provider, with plans to follow-up a second time one month later which at that point he may need education refills. My hope is that at the first visit patient will discuss with his new provider his pain and they will mutually agree on a pain management regimen prior to him needing any refills.   Meds ordered this encounter  Medications  . DISCONTD: oxyCODONE-acetaminophen (PERCOCET) 10-325 MG tablet    Sig: Take 1 tablet by mouth every 6 (six) hours as needed for pain. To be taken as needed now until surgery, then restart frequency 1 week after surgery.    Dispense:  75 tablet    Refill:  0    Do not fill until 01/16/17.  Marland Kitchen oxyCODONE-acetaminophen (PERCOCET) 10-325 MG tablet    Sig: Take 1 tablet by mouth every 6 (six) hours as needed for pain. To be taken as needed now until surgery, then restart frequency 1 week after surgery.    Dispense:  75 tablet    Refill:  0    Do not fill until 02/15/17.     Elberta Leatherwood, MD,MS,  PGY3 01/16/2017 11:39 AM

## 2017-01-16 NOTE — Assessment & Plan Note (Signed)
Patient is here to follow-up on his chronic abdominal pain. Is currently well-controlled on his current medication regimen however at our last visit there was hope that he may be able to reduce this regimen at this point in time. Unfortunately, his abdominal pain seems to be worse and I am not currently in favor of reducing this regimen. - I provided patient with 2 months of medication. - I've asked patient to follow-up in one month with his new primary care provider, with plans to follow-up a second time one month later which at that point he may need education refills. My hope is that at the first visit patient will discuss with his new provider his pain and they will mutually agree on a pain management regimen prior to him needing any refills.

## 2017-02-05 ENCOUNTER — Ambulatory Visit: Payer: Self-pay | Admitting: Psychology

## 2017-02-05 ENCOUNTER — Other Ambulatory Visit: Payer: Self-pay

## 2017-02-05 ENCOUNTER — Telehealth: Payer: Self-pay | Admitting: Psychology

## 2017-02-05 DIAGNOSIS — R1084 Generalized abdominal pain: Secondary | ICD-10-CM

## 2017-02-05 DIAGNOSIS — K219 Gastro-esophageal reflux disease without esophagitis: Secondary | ICD-10-CM

## 2017-02-05 DIAGNOSIS — R112 Nausea with vomiting, unspecified: Secondary | ICD-10-CM

## 2017-02-05 MED ORDER — PANTOPRAZOLE SODIUM 40 MG PO TBEC: 40.0000 mg | DELAYED_RELEASE_TABLET | Freq: Two times a day (BID) | ORAL | 11 refills | Status: DC

## 2017-02-05 NOTE — Telephone Encounter (Signed)
Pt calling for refill on medication. Had a couple of bad days. Was out of protonix for 4 days. Sees Dr. Esperanza Sheets on Friday July 27th. Would like to get enough until then. Please let pt know if medication will be refilled. Ottis Stain, CMA

## 2017-02-05 NOTE — Telephone Encounter (Addendum)
Patient left a VM stating he would not attend his [facility] appointment because he didn't feel like coming.  Called patient to check-in.    He said that he has been in a lot of pain recently and feeling "down in the dumps."  Not out of bed the last two days but out today.  Would like to reschedule and opted for 8/1/at 9:30.

## 2017-02-06 ENCOUNTER — Telehealth: Payer: Self-pay | Admitting: Internal Medicine

## 2017-02-06 MED ORDER — OXYCODONE-ACETAMINOPHEN 10-325 MG PO TABS: 1.0000 | ORAL_TABLET | Freq: Three times a day (TID) | ORAL | 0 refills | Status: DC | PRN

## 2017-02-06 NOTE — Telephone Encounter (Signed)
Pt has run out of his pain med.  He has an appt with Dr Ola Spurr.  He would like enough pills to make it to his appt next week. Please call when this is available for pickup

## 2017-02-06 NOTE — Telephone Encounter (Signed)
2nd request. Please call pt when it is ready for pick up. ep

## 2017-02-06 NOTE — Telephone Encounter (Signed)
Yesterday's message was that patient ran out of protonix, which I refilled. Confirmed with patient he needs pain medication. Placed Rx for #15 pills up front for patient and made him aware.

## 2017-02-14 ENCOUNTER — Other Ambulatory Visit: Payer: Self-pay | Admitting: Internal Medicine

## 2017-02-14 ENCOUNTER — Encounter: Payer: Self-pay | Admitting: Internal Medicine

## 2017-02-14 ENCOUNTER — Ambulatory Visit (INDEPENDENT_AMBULATORY_CARE_PROVIDER_SITE_OTHER): Payer: Medicaid Other | Admitting: Internal Medicine

## 2017-02-14 VITALS — BP 122/68 | HR 109 | Temp 98.7°F | Ht 69.5 in | Wt 150.0 lb

## 2017-02-14 DIAGNOSIS — G894 Chronic pain syndrome: Secondary | ICD-10-CM | POA: Insufficient documentation

## 2017-02-14 DIAGNOSIS — Z720 Tobacco use: Secondary | ICD-10-CM

## 2017-02-14 MED ORDER — FAMOTIDINE 20 MG PO TABS: 20.0000 mg | ORAL_TABLET | Freq: Two times a day (BID) | ORAL | 1 refills | Status: DC

## 2017-02-14 NOTE — Assessment & Plan Note (Addendum)
-   2/2 chronic abdominal wound infection. Worsened somewhat by GERD symptoms. - Not due for medication refill until end of next month. - Recommended adding pepcid 20 mg BID to regimen of protonix 40 mg BID to see if this helps decrease amount of pain medication he is taking - Encouraged taking gabapentin for nerve-type pain - Patient to contact Dr. Marlou Starks about rescheduling wound repair now that he is not having GI bleeding - Provided handout on foods that make can make GERD symptoms worse; advised patient to cut out carbonated beverages

## 2017-02-14 NOTE — Assessment & Plan Note (Signed)
-   Not interested in quitting at this time. Counseled about impact on wound healing.

## 2017-02-14 NOTE — Patient Instructions (Signed)
[name],  It was nice to see you today.  Continue protonix twice daily. Try taking pepcid 20 mg twice daily as well. We can switch this to just nightly after 4-6 weeks.  Please see me back before August 23rd for pain medication refills.  Best, Dr. Ola Spurr   Food Choices for Gastroesophageal Reflux Disease, Adult When you have gastroesophageal reflux disease (GERD), the foods you eat and your eating habits are very important. Choosing the right foods can help ease your discomfort. What guidelines do I need to follow?  Choose fruits, vegetables, whole grains, and low-fat dairy products.  Choose low-fat meat, fish, and poultry.  Limit fats such as oils, salad dressings, butter, nuts, and avocado.  Keep a food diary. This helps you identify foods that cause symptoms.  Avoid foods that cause symptoms. These may be different for everyone.  Eat small meals often instead of 3 large meals a day.  Eat your meals slowly, in a place where you are relaxed.  Limit fried foods.  Cook foods using methods other than frying.  Avoid drinking alcohol.  Avoid drinking large amounts of liquids with your meals.  Avoid bending over or lying down until 2-3 hours after eating. What foods are not recommended? These are some foods and drinks that may make your symptoms worse: Vegetables Tomatoes. Tomato juice. Tomato and spaghetti sauce. Chili peppers. Onion and garlic. Horseradish. Fruits Oranges, grapefruit, and lemon (fruit and juice). Meats High-fat meats, fish, and poultry. This includes hot dogs, ribs, ham, sausage, salami, and bacon. Dairy Whole milk and chocolate milk. Sour cream. Cream. Butter. Ice cream. Cream cheese. Drinks Coffee and tea. Bubbly (carbonated) drinks or energy drinks. Condiments Hot sauce. Barbecue sauce. Sweets/Desserts Chocolate and cocoa. Donuts. Peppermint and spearmint. Fats and Oils High-fat foods. This includes [place] fries and potato  chips. Other Vinegar. Strong spices. This includes black pepper, white pepper, red pepper, cayenne, curry powder, cloves, ginger, and chili powder. The items listed above may not be a complete list of foods and drinks to avoid. Contact your dietitian for more information. This information is not intended to replace advice given to you by your health care provider. Make sure you discuss any questions you have with your health care provider. Document Released: 01/07/2012 Document Revised: 12/14/2015 Document Reviewed: 05/12/2013 Elsevier Interactive Patient Education  2017 [employer].

## 2017-02-14 NOTE — Progress Notes (Signed)
[name] Progress Note  Subjective:  Timothy [name] is a 51 y.o. male with history of persistent abdominal wound after ventral hernia repair with mesh after multiple abdominal explorations for perforated diverticulitis, hx of partial colectomy with leak, tobacco abuse, bipolar disorder and duodenal ulcer. He presents for chronic pain management.  #Chronic pain: - Continues to have lower abdominal pain around incisional wound. Describes as mostly a sharp, cramping feeling but sometimes burning.  - Required increased pain medication due to running out of protonix, which he takes for a duodenal ulcer. He follows with Gastroenterologist Dr. Fuller Plan. Missed last follow-up appointment. He is taking protonix 40 mg BID and thinks his "belly is getting used to it," such that he has some pain at night. He tries to stay away from pizza and spicy foods. Still drinking carbonated beverages. Said he took zantac and pepcid in past but thought it didn't help, so he stopped taking it.  - Abdominal wound with minimal drainage. Patient changing dressing about twice a day.  - Having regular formed bowel movements without blood - Has rx from last PCP for oxycodone-acetaminophen 10-325 mg, #75 mg. Says he is taking about 2 pills a day. Sometimes he will take half a pill and then take the other later. Some days he does not need pain medicine. If he is more active, he needs more pain medication. - Is not taking gabapentin very often, as it makes him tired.  - When asked where he would hope to see himself in another year, patient says he is not sure because he has been dealing with this for so long. - Patient's surgeon is Dr. Marlou [facility]arks at [facility]. He says he is supposed to contact him about undergoing another surgery to address wound. He had previously been scheduled for repair of defect in midline abdominal incision on 5/31, but this was delayed by bloody BMs. ROS: No n/v, no hematochezia  #Tobacco  abuse: - 2 PPD, 30+ years - Not interested in quitting at this time; feels he needs it due to stress of not being able to much besides staying at home due to chronic pain - Quit for 3 months after one of his abdominal surgeries in the past - Has tried chantix, lozenges, patch - Follows with Dr. Gwenlyn Saran once a month for mood  Social: Current smoker.   Objective: Blood pressure 122/68, pulse (!) 109, temperature 98.7 F (37.1 C), temperature source Oral, height 5' 9.5" (1.765 m), weight 150 lb (68 kg), SpO2 97 %. Body mass index is 21.83 kg/m. Constitutional: Thin male, in NAD Cardiovascular: RRR, S1, S2, no m/r/g.  Pulmonary/Chest: Effort normal and breath sounds normal. No respiratory distress.  Abdominal: Soft. +BS, TTP right of midline incision wound, small ~1 cm open wound with minimal drainage without surrounding erythema Psychiatric: Normal mood and affect.  Vitals reviewed  Assessment/Plan: Chronic pain syndrome - 2/2 chronic abdominal wound infection. Worsened somewhat by GERD symptoms. - Not due for medication refill until end of next month. - Recommended adding pepcid 20 mg BID to regimen of protonix 40 mg BID to see if this helps decrease amount of pain medication he is taking - Encouraged taking gabapentin for nerve-type pain - Patient to contact Dr. Marlou Starks about rescheduling wound repair now that he is not having GI bleeding - Provided handout on foods that make can make GERD symptoms worse; advised patient to cut out carbonated beverages  Tobacco abuse - Not interested in quitting at this time. Counseled about  impact on wound healing.   Follow-up next month to discuss medication refills.  Olene Floss, MD [place], PGY-3

## 2017-02-19 ENCOUNTER — Ambulatory Visit: Payer: Medicaid Other | Admitting: Psychology

## 2017-03-07 ENCOUNTER — Ambulatory Visit (INDEPENDENT_AMBULATORY_CARE_PROVIDER_SITE_OTHER): Payer: Medicaid Other | Admitting: Internal Medicine

## 2017-03-07 ENCOUNTER — Encounter: Payer: Self-pay | Admitting: Internal Medicine

## 2017-03-07 ENCOUNTER — Telehealth: Payer: Self-pay | Admitting: *Deleted

## 2017-03-07 ENCOUNTER — Other Ambulatory Visit: Payer: Self-pay | Admitting: Family Medicine

## 2017-03-07 VITALS — BP 140/78 | HR 113 | Temp 98.5°F | Ht 69.5 in | Wt 144.2 lb

## 2017-03-07 DIAGNOSIS — G894 Chronic pain syndrome: Secondary | ICD-10-CM

## 2017-03-07 DIAGNOSIS — Z720 Tobacco use: Secondary | ICD-10-CM | POA: Diagnosis not present

## 2017-03-07 MED ORDER — OXYCODONE-ACETAMINOPHEN 10-325 MG PO TABS: 1.0000 | ORAL_TABLET | Freq: Three times a day (TID) | ORAL | 0 refills | Status: DC | PRN

## 2017-03-07 NOTE — Telephone Encounter (Signed)
Completed PA and placed in Tamika's box.

## 2017-03-07 NOTE — Patient Instructions (Signed)
Mr. Aithan, [place] job with increasing your water intake and cutting back on cigarettes.  Continue protein shakes and take zofran as needed if you are having a harder time eating regular meals.   Continue to take the percocet just as needed.  Please call Dr. Marlou Starks and Dr. Gwenlyn Saran for follow-up appointments.  I will see you back in 4-5 weeks.  Best, Dr. Ola Spurr

## 2017-03-07 NOTE — Telephone Encounter (Signed)
Prior Authorization received from [place] for oxycodone-acetaminophen 10-325 mg. Formulary and PA form placed in provider box for completion. Derl Barrow, RN

## 2017-03-07 NOTE — Progress Notes (Signed)
[name] Progress Note  Subjective:  Timothy [name] is a 51 y.o. persistent abdominal wound after ventral hernia repair with mesh after multiple abdominal explorations for perforated diverticulitis, hx of partial colectomy with leak, tobacco abuse, bipolar disorder and duodenal ulcer. He presents for chronic pain management.  #Abdominal pain: - Had a bad weekend with increased LLQ pain.  - Drainage from chronic wound stable and still requiring twice daily dressing changes. - Has had fewer reflux symptoms since cutting back on soda.  - Needing about 3 percocet tablets on a bad day but will have some days he does not take percocet. Usually takes 1-2 tablets a day and will start with half a tablet.  - Also continues to take gabapentin 600 but prn rather than TID - Did not pick up pepcid yet to add to protonix therapy given expressed increase in symptoms at last OV. - Says he cancelled his mood appointment with Dr. Gwenlyn Saran the last couple of months because he was feeling down - Did not call his surgeon Dr. Marlou Starks after last visit; repeat surgery previously on hold for GI bleed found to be 2/2 duodenal ulcer - Says he still has 8 tablets left for this month ROS: No fevers, no hemoptysis or hematochezia, no SI/HI  #Tobacco abuse: - Not ready to quit at this time. Trying to smoker less than a pack a day.    Allergies  Allergen Reactions  . Ambien [Zolpidem Tartrate] Other (See Comments)    Caused agitation of mood.   [name] [Propoxyphene N-Acetaminophen] Hives and Itching  . Morphine And Related Itching  . Bactroban [Mupirocin Calcium] Swelling    Caused infection at a surgical site  . Lisinopril Cough    Objective: Blood pressure 140/78, pulse (!) 113, temperature 98.5 F (36.9 C), temperature source Oral, height 5' 9.5" (1.765 m), weight 144 lb 3.2 oz (65.4 kg), SpO2 98 %. Body mass index is 20.99 kg/m. Constitutional: Thin male in NAD HENT: MMM Cardiovascular: RRR,  S1, S2, no m/r/g.  Pulmonary/Chest: Effort normal and breath sounds normal. No respiratory distress.  Abdominal: Soft. +BS, mild TTP over umbilical region and LLQ. <1 cm wound of lower abdomen with bandage showing scant amount of serous drainage.   Neurological: AOx3, no focal deficits. Skin: Skin is warm and dry. No rash noted. No erythema.  Vitals reviewed  Assessment/Plan: Chronic pain syndrome - Stable, still with good and bad days. GERD symptoms contributing less. No increased drainage of chronic abdominal wound. Will continue providing #75 tablets of oxycodone-acetaminophen 10-325 mg with plan to taper down after patient undergoes next corrective surgery - Discussed trying to limit greasy/spicy foods - Patient to contact his surgeon Dr. Marlou Starks to schedule follow-up appointment - To schedule with Dr. Gwenlyn Saran to further address depressed mood Indication for chronic opioid: abdominal wound Medication and dose: oxycodone-acetaminophen 10-325 # pills per month: 75 Last UDS date: Due. Will order at next visit.  Pain contract signed (Y/N): Y Date narcotic database last reviewed (include red flags): 03/07/17. Appropriate refills. No red flags.     Tobacco abuse - Not ready to quit at this time. Attempting to cut back.    Follow-up next month for reassessment and refills.  Olene Floss, MD [place], PGY-3

## 2017-03-08 NOTE — Assessment & Plan Note (Signed)
-   Not ready to quit at this time. Attempting to cut back.

## 2017-03-08 NOTE — Assessment & Plan Note (Addendum)
-   Stable, still with good and bad days. GERD symptoms contributing less. No increased drainage of chronic abdominal wound. Will continue providing #75 tablets of oxycodone-acetaminophen 10-325 mg with plan to taper down after patient undergoes next corrective surgery - Discussed trying to limit greasy/spicy foods - Patient to contact his surgeon Dr. Marlou Starks to schedule follow-up appointment - To schedule with Dr. Gwenlyn Saran to further address depressed mood Indication for chronic opioid: abdominal wound Medication and dose: oxycodone-acetaminophen 10-325 # pills per month: 75 Last UDS date: Due. Will order at next visit.  Pain contract signed (Y/N): Y Date narcotic database last reviewed (include red flags): 03/07/17. Appropriate refills. No red flags.

## 2017-03-10 NOTE — Telephone Encounter (Signed)
PA is pending per [place] [employer].  Derl Barrow, RN

## 2017-03-10 NOTE — Telephone Encounter (Signed)
Patient made aware that PA for oxycodone was approved via [place] [employer] until 09/06/17.  Approval number: [id number].  Derl Barrow, RN

## 2017-03-12 ENCOUNTER — Emergency Department (HOSPITAL_COMMUNITY)
Admission: EM | Admit: 2017-03-12 | Discharge: 2017-03-12 | Disposition: A | Discharge status: Home / Self Care | Payer: Medicaid Other | Attending: Emergency Medicine | Admitting: Emergency Medicine

## 2017-03-12 DIAGNOSIS — Z79899 Other long term (current) drug therapy: Secondary | ICD-10-CM | POA: Insufficient documentation

## 2017-03-12 DIAGNOSIS — J449 Chronic obstructive pulmonary disease, unspecified: Secondary | ICD-10-CM | POA: Diagnosis not present

## 2017-03-12 DIAGNOSIS — F319 Bipolar disorder, unspecified: Secondary | ICD-10-CM | POA: Insufficient documentation

## 2017-03-12 DIAGNOSIS — F17210 Nicotine dependence, cigarettes, uncomplicated: Secondary | ICD-10-CM | POA: Diagnosis not present

## 2017-03-12 DIAGNOSIS — F1910 Other psychoactive substance abuse, uncomplicated: Secondary | ICD-10-CM | POA: Insufficient documentation

## 2017-03-12 DIAGNOSIS — F1990 Other psychoactive substance use, unspecified, uncomplicated: Secondary | ICD-10-CM

## 2017-03-12 DIAGNOSIS — I10 Essential (primary) hypertension: Secondary | ICD-10-CM | POA: Diagnosis not present

## 2017-03-12 LAB — I-STAT CHEM 8, ED
BUN: 21 mg/dL — AB (ref 6–20)
CREATININE: 1.1 mg/dL (ref 0.61–1.24)
Calcium, Ion: 1.13 mmol/L — ABNORMAL LOW (ref 1.15–1.40)
Chloride: 101 mmol/L (ref 101–111)
Glucose, Bld: 125 mg/dL — ABNORMAL HIGH (ref 65–99)
HEMATOCRIT: 46 % (ref 39.0–52.0)
Hemoglobin: 15.6 g/dL (ref 13.0–17.0)
POTASSIUM: 3.6 mmol/L (ref 3.5–5.1)
Sodium: 136 mmol/L (ref 135–145)
TCO2: 23 mmol/L (ref 0–100)

## 2017-03-12 NOTE — ED Provider Notes (Signed)
[facility] DEPT Provider Note   CSN: [id number] Arrival date & time: 03/12/17  1847     History   Chief Complaint Chief Complaint  Patient presents with  . Medical Clearance    HPI Timothy [name] is a 51 y.o. male.  The history is provided by the patient.  Drug / Alcohol Assessment  Primary symptoms include intoxication. This is a recurrent problem. The problem has been gradually improving. Suspected agents include ecstasy (reports using ecstasy 2 days ago). Pertinent negatives include no fever, no injury, no nausea, no vomiting, no bladder incontinence and no bowel incontinence. Associated medical issues include chronic illness, mental illness and psychiatric history. Associated medical issues do not include addiction treatment, withdrawal syndrome, recent illness or recent infection.   Patient reports that he's been feeling more forgetful plan is had trouble sleeping over the past several days. States that he's been forgetting to take his bipolar medication. Reports that he keeps it locked up at home. He reports that he recently had a follow-up appointment with his psychiatrist/therapist who changed the dosing of his medication. States that he had it refilled one week ago. Denies any fleeting thoughts, suicide ideations, homicidal ideation, auditory/visual hallucinations.  Past Medical History:  Diagnosis Date  . Acute bronchitis 02/15/2013  . Alcohol abuse   . Anxiety   . Bipolar disorder ([place])   . Bronchitis    history  . Bronchitis   . Cellulitis    - left knee-2009  . Chronic pain   . Condyloma 02/04/2012  . Depression   . Diverticulosis    By colonoscopy June 2005  . Dyspnea    on exertion at times  . Elevated CK 3/13  . Emphysema lung ([place])   . Emphysema of lung ([place])   . Family history of anesthesia complication    Mother N/V  . GERD (gastroesophageal reflux disease)   . Glaucoma syndrome    does not use eye drops, "They burn".  [facility])    "alot;  not daily" (07/23/2012)  . Heavy smoker   . Hemorrhoids   . History of dizziness   . History of stomach ulcers 2005  . HPV (human papilloma virus) infection   . Hypertension    started 3 months ago  . Hypoglycemia   . Incisional hernia   . Lower GI bleed    June 2005. Presumably secondary to diverticulosis.  . Migraines    "not often" (07/23/2012)    Patient Active Problem List   Diagnosis Date Noted  . Chronic pain syndrome 02/14/2017  . PUD (peptic ulcer disease) 01/16/2017  . Chronic abdominal wound infection 08/16/2016  . Abdominal pain, chronic, right lower quadrant 08/06/2016  . Skin ulcer of abdominal wall ([place]) 12/27/2015  . COPD exacerbation ([place]) 07/12/2015  . Abdominal pain, epigastric 06/20/2015  . Erectile dysfunction 03/14/2014  . Abdominal pain, chronic, diffuse 04/15/2013  . Akathisia 03/02/2013  . Ventral hernia 01/11/2013  . Migraine headache 08/23/2012  . Essential hypertension, benign 06/28/2012  . Bipolar disorder ([place]) 06/02/2012  . Insomnia 04/27/2012  . Panic anxiety syndrome 03/21/2012  . History of colostomy 03/21/2012  . History of alcohol abuse 10/29/2011  . History of diverticulitis of colon 10/16/2011  . Tobacco abuse 10/16/2011    Past Surgical History:  Procedure Laterality Date  . ABDOMINAL EXPLORATION SURGERY  07/23/2012   w/LOA (07/23/2012)  . APPENDECTOMY  1982  . COLON RESECTION  02/27/2012   Procedure: COLON RESECTION;  Surgeon: Merrie Roof, MD;  Location: [facility];  Service: General;  Laterality: N/A;  . COLOSTOMY  02/27/2012   Procedure: COLOSTOMY;  Surgeon: Merrie Roof, MD;  Location: [facility];  Service: General;  Laterality: N/A;  . COLOSTOMY TAKEDOWN  07/23/2012  . COLOSTOMY TAKEDOWN  07/23/2012   Procedure: COLOSTOMY TAKEDOWN;  Surgeon: Merrie Roof, MD;  Location: [facility];  Service: General;  Laterality: N/A;  Primary Anastomosis  . ELBOW FRACTURE SURGERY  ~ 1975   left;  pins inserted   . INSERTION OF MESH N/A 05/20/2013    Procedure: INSERTION OF MESH;  Surgeon: Merrie Roof, MD;  Location: [facility];  Service: General;  Laterality: N/A;  . LAPAROSCOPIC LEFT COLON RESECTION  02/19/2012   SIGMOID  . LAPAROTOMY  02/27/2012   Procedure: EXPLORATORY LAPAROTOMY;  Surgeon: Merrie Roof, MD;  Location: [facility];  Service: General;  Laterality: N/A;  . LAPAROTOMY  07/23/2012   Procedure: EXPLORATORY LAPAROTOMY;  Surgeon: Merrie Roof, MD;  Location: [facility];  Service: General;  Laterality: N/A;  . LESION DESTRUCTION  07/23/2012   Procedure: DESTRUCTION LESION ANUS;  Surgeon: Merrie Roof, MD;  Location: [facility];  Service: General;  Laterality: N/A;  [place]  . LYSIS OF ADHESION  07/23/2012   Procedure: LYSIS OF ADHESION;  Surgeon: Merrie Roof, MD;  Location: [facility];  Service: General;  Laterality: N/A;  . VENTRAL HERNIA REPAIR  05/20/2013   Dr Marlou Starks  . VENTRAL HERNIA REPAIR N/A 05/20/2013   Procedure: VENTRAL HERNIA REPAIR ;  Surgeon: Merrie Roof, MD;  Location: [facility];  Service: General;  Laterality: N/A;  . WART FULGURATION  02/19/2012   Procedure: FULGURATION ANAL WART;  Surgeon: Merrie Roof, MD;  Location: [facility];  Service: General;  Laterality: N/A;  Destroy  Anal Condyloma   . WISDOM TOOTH EXTRACTION  ~ 1983  . WOUND DEBRIDEMENT N/A 06/03/2016   Procedure: ABDOMINAL WOUND EXPLORATION AND STITCH REMOVAL;  Surgeon: [name], MD;  Location: [facility];  Service: General;  Laterality: N/A;  ABDOMINAL WOUND EXPLORATION AND STITCH REMOVAL       Home Medications    Prior to Admission medications   Medication Sig Start Date End Date Taking? Authorizing Provider  acetaminophen (TYLENOL) 500 MG tablet Take 1,500 mg by mouth 2 (two) times daily as needed for mild pain or headache.   Yes [provider]  albuterol (PROVENTIL HFA;VENTOLIN HFA) 108 (90 Base) MCG/ACT inhaler Inhale 2 puffs into the lungs every 6 (six) hours as needed for wheezing or shortness of breath.  12/15/16  Yes Gonfa, Taye T, MD  busPIRone (BUSPAR) 15 MG tablet Take 15 mg by mouth at bedtime. Per Dr. Tammi Klippel in [facility].  Once at night.  #30 with 11 refills.   09/25/16 08/28/17 Yes Vickii Penna, MD  cyclobenzaprine (FLEXERIL) 10 MG tablet TAKE 1 TABLET BY MOUTH THREE TIMES DAILY AS NEEDED FOR MUSCULE SPASMS 11/11/16  Yes McKeag, Marylynn Pearson, MD  diazepam (VALIUM) 5 MG tablet Take 5 mg by mouth 2 (two) times daily. Per Dr. Tammi Klippel in [facility].  #60 with five refills.   09/25/16 03/28/17 Yes Vickii Penna, MD  gabapentin (NEURONTIN) 300 MG capsule Take 2 capsules (600 mg total) by mouth 3 (three) times daily. 12/12/16  Yes McKeag, Marylynn Pearson, MD  losartan (COZAAR) 100 MG tablet Take 1 tablet (100 mg total) by mouth daily. 02/29/16  Yes McKeag, Marylynn Pearson, MD  lurasidone (LATUDA) 40 MG TABS tablet Take 40 mg by mouth daily with breakfast. Per Dr. Tammi Klippel in [facility].  #30 with 11 refills.   09/25/16 08/28/17 Yes Vickii Penna, MD  ondansetron (ZOFRAN) 8 MG tablet Take 1 tablet (8 mg total) by mouth every 8 (eight) hours as needed for nausea or vomiting. 12/04/16  Yes Vickii Penna, MD  oxyCODONE-acetaminophen (PERCOCET) 10-325 MG tablet Take 1 tablet by mouth every 8 (eight) hours as needed for pain. 03/07/17  Yes Rogue Bussing, MD  pantoprazole (PROTONIX) 40 MG tablet Take 1 tablet (40 mg total) by mouth 2 (two) times daily. 02/05/17  Yes Rogue Bussing, MD  famotidine (PEPCID) 20 MG tablet TAKE 1 TABLET BY MOUTH TWICE DAILY Patient not taking: Reported on 03/12/2017 02/14/17   Rogue Bussing, MD    Family History Family History  Problem Relation Age of Onset  . Diabetes Mother   . Parkinsonism Mother   . Depression Mother   . Alcohol abuse Father   . Hypertension Father   . Anxiety disorder Father   . Ulcers Father   . Colon polyps Father   . Breast cancer Maternal Aunt   . Diabetes Maternal Grandmother   . Stroke Neg Hx   . Heart disease Neg Hx   . Colon cancer Neg  Hx   . Stomach cancer Neg Hx     Social History Social History  Substance Use Topics  . Smoking status: Current Every Day Smoker    Packs/day: 2.00    Years: 34.00    Types: Cigarettes    Start date: 07/22/1981  . Smokeless tobacco: Former Systems developer    Types: Chew    Quit date: 12/12/2010     Comment: Current 2 PPD smoker - [place]  . Alcohol use No     Allergies   Ambien [zolpidem tartrate]; Darvocet [propoxyphene n-acetaminophen]; Morphine and related; Bactroban [mupirocin calcium]; and Lisinopril   Review of Systems Review of Systems  Constitutional: Negative for fever.  Gastrointestinal: Negative for bowel incontinence, nausea and vomiting.  Genitourinary: Negative for bladder incontinence.  All other systems are reviewed and are negative for acute change except as noted in the HPI    Physical Exam Updated Vital Signs BP (!) 151/104 (BP Location: Right Arm)   Pulse (!) 127   Temp 98.1 F (36.7 C) (Oral)   Resp 16   SpO2 100%   Physical Exam  Constitutional: He is oriented to person, place, and time. He appears well-developed and well-nourished. No distress.  HENT:  Head: Normocephalic and atraumatic.  Nose: Nose normal.  Eyes: Pupils are equal, round, and reactive to light. Conjunctivae and EOM are normal. Right eye exhibits no discharge. Left eye exhibits no discharge. No scleral icterus.  Neck: Normal range of motion. Neck supple.  Cardiovascular: Normal rate and regular rhythm.  Exam reveals no gallop and no friction rub.   No murmur heard. Pulmonary/Chest: Effort normal and breath sounds normal. No stridor. No respiratory distress. He has no rales.  Abdominal: Soft. He exhibits no distension. There is no tenderness.  Musculoskeletal: He exhibits no edema or tenderness.  Neurological: He is alert and oriented to person, place, and time.  Skin: Skin is warm and dry. No rash noted. He is not diaphoretic. No erythema.  Psychiatric: He has a normal mood and  affect. His speech is normal. Thought content is not paranoid and not delusional. He expresses no homicidal  and no suicidal ideation. He expresses no suicidal plans and no homicidal plans.  Vitals reviewed.    ED Treatments / Results  Labs (all labs ordered are listed, but only abnormal results are displayed) Labs Reviewed  I-STAT CHEM 8, ED - Abnormal; Notable for the following:       Result Value   BUN 21 (*)    Glucose, Bld 125 (*)    Calcium, Ion 1.13 (*)    All other components within normal limits    EKG  EKG Interpretation None       Radiology No results found.  Procedures Procedures (including critical care time)  Medications Ordered in ED Medications - No data to display   Initial Impression / Assessment and Plan / ED Course  I have reviewed the triage vital signs and the nursing notes.  Pertinent labs & imaging results that were available during my care of the patient were reviewed by me and considered in my medical decision making (see chart for details).     No evidence of mania at this time. The patient is not currently intoxicated, and does not appear to be under the influence. He is denying any suicidal ideations, homicidal ideations radiates. No evidence of acute psychosis. Do not feel that the patient is a threat to himself or others. However did recommend patient given back on his medication and follow up closely with his psychiatrist/therapist.  The patient is safe for discharge with strict return precautions.   Final Clinical Impressions(s) / ED Diagnoses   Final diagnoses:  Drug use  Bipolar 1 disorder ([place])   Disposition: Discharge  Condition: Good  I have discussed the results, Dx and Tx plan with the patient who expressed understanding and agree(s) with the plan. Discharge instructions discussed at great length. The patient was given strict return precautions who verbalized understanding of the instructions. No further questions at time  of discharge.    New Prescriptions   No medications on file    Follow Up: Therapist  Call  For close follow up.      Fatima Blank, MD 03/12/17 2252

## 2017-03-12 NOTE — ED Triage Notes (Signed)
Pt reports has not been able to sleep or eat last four days admits to forgetting to take his medication, also admits to taking [facility] yesterday and states he currently feels confused and dillusional.

## 2017-03-12 NOTE — Discharge Instructions (Signed)
Please start taking your bipolar medication and follow up closely with your psychiatrist/therapist.

## 2017-03-14 ENCOUNTER — Telehealth: Payer: Self-pay | Admitting: Psychology

## 2017-03-14 NOTE — Telephone Encounter (Signed)
Timothy [name] called and left a VM.  I returned his call and left a VM.

## 2017-03-17 NOTE — Telephone Encounter (Signed)
Been exchanging VM with Timothy [name].  Talked to him today.  He reports he is not doing well.  Very anxious.  Had stopped taking his medicine for Bipolar Disorder secondary to stress and feeling overwhelmed.  Tried ecstasy around 8/20 and ended up feeling especially bad.  Went to the ED on 8/22.  Was not deemed to be a risk to himself or others so discharged him.  Recommended he restart his medicines which he did on 8/22.  Still feeling very anxious.  Reports he has been out of diazepam for 2-3 weeks and can't get it refilled.  Reports he does not take this medicine everyday.  Denies use of drugs currently other than THC every week or week and a half.    Offered first [facility] appointment which is 9/19 at 9:00.  He wishes to come talk to me about how he is feeling.  Scheduled that for Wednesday at 9:30.    Reviewed reasons to go back to the ED.  He voiced an understanding.

## 2017-03-19 ENCOUNTER — Ambulatory Visit: Payer: Self-pay | Admitting: Psychology

## 2017-03-19 ENCOUNTER — Telehealth: Payer: Self-pay | Admitting: Family Medicine

## 2017-03-19 NOTE — Telephone Encounter (Signed)
Patient did not show for appointment scheduled just two days ago.  He reported he was not doing well at that time.  Called and left a VM.

## 2017-03-19 NOTE — Telephone Encounter (Signed)
**  After Hours/ Emergency Line Call*  Received a call from Timothy [name] concerning feelings of dry mouth and vertigo. Onset earlier this morning. He has been drinking water throughout the day. Has been taking all of his prescribed medications this morning which include Percocet, limited to death, gabapentin, Flexeril, BuSpar. Patient states he had similar symptoms when he was seen in the emergency room on 8/22. He also endorses use of Molly prior to the ED visit but denies use over the last 24 hours. Denying fever/chills, nausea/vomiting, change in vision or syncope. Patient also stating he has a chronic abdominal wound that has been draining some green fluid since yesterday. This is chronic for him. Patient lives with his son. No available appointment tomorrow at [facility]. Patient reluctant to go to ED. Instructed patient to call clinic in the morning to schedule same day, otherwise go to urgent care.  Red flags discussed.  Will forward to PCP.  [name], [place], PGY-2

## 2017-03-27 ENCOUNTER — Ambulatory Visit: Payer: Medicaid Other | Admitting: Psychology

## 2017-03-30 ENCOUNTER — Telehealth: Payer: Self-pay | Admitting: Student in an Organized Health Care Education/Training Program

## 2017-03-30 DIAGNOSIS — J449 Chronic obstructive pulmonary disease, unspecified: Secondary | ICD-10-CM | POA: Diagnosis not present

## 2017-03-30 DIAGNOSIS — F1094 Alcohol use, unspecified with alcohol-induced mood disorder: Secondary | ICD-10-CM | POA: Insufficient documentation

## 2017-03-30 DIAGNOSIS — F17210 Nicotine dependence, cigarettes, uncomplicated: Secondary | ICD-10-CM | POA: Diagnosis not present

## 2017-03-30 DIAGNOSIS — R45851 Suicidal ideations: Secondary | ICD-10-CM | POA: Insufficient documentation

## 2017-03-30 DIAGNOSIS — Z79899 Other long term (current) drug therapy: Secondary | ICD-10-CM | POA: Insufficient documentation

## 2017-03-30 DIAGNOSIS — F1494 Cocaine use, unspecified with cocaine-induced mood disorder: Secondary | ICD-10-CM | POA: Diagnosis not present

## 2017-03-30 DIAGNOSIS — I10 Essential (primary) hypertension: Secondary | ICD-10-CM | POA: Insufficient documentation

## 2017-03-30 DIAGNOSIS — F329 Major depressive disorder, single episode, unspecified: Secondary | ICD-10-CM | POA: Diagnosis present

## 2017-03-30 DIAGNOSIS — Z811 Family history of alcohol abuse and dependence: Secondary | ICD-10-CM | POA: Diagnosis not present

## 2017-03-30 DIAGNOSIS — Z818 Family history of other mental and behavioral disorders: Secondary | ICD-10-CM | POA: Diagnosis not present

## 2017-03-30 NOTE — Telephone Encounter (Signed)
**  After Hours/ Emergency Line Call*  Received a call from Timothy [name] who is currently at [facility]. He reports he is having depression and he states that because he was going to hurt himself he is not allowed to leave [employer] [facility].  He asks that we "get him home" and he wants to "sleep in his own bed."   Informed patient I am not able to make decisions regarding his disposition over the phone and recommend following the advice of the providers who are evaluating him at [facility]. Patient does not seem satisfied with this and he hangs up the phone.  Everrett Coombe, MD PGY-2, [facility]

## 2017-03-30 NOTE — ED Triage Notes (Signed)
Pt comes from home via EMS with complaints of SI and alcohol intoxication. Stated he has been depressed lately and has had thoughts of harming himself. Hx of substance abuse. Ambulatory. A&O x4. BP 208/100, HR 120 in route.

## 2017-03-31 ENCOUNTER — Emergency Department (HOSPITAL_COMMUNITY)
Admission: EM | Admit: 2017-03-31 | Discharge: 2017-03-31 | Disposition: A | Discharge status: Home / Self Care | Payer: Medicaid Other | Attending: Emergency Medicine | Admitting: Emergency Medicine

## 2017-03-31 ENCOUNTER — Encounter (HOSPITAL_COMMUNITY): Payer: Self-pay | Admitting: Emergency Medicine

## 2017-03-31 DIAGNOSIS — Z811 Family history of alcohol abuse and dependence: Secondary | ICD-10-CM | POA: Diagnosis not present

## 2017-03-31 DIAGNOSIS — F1910 Other psychoactive substance abuse, uncomplicated: Secondary | ICD-10-CM

## 2017-03-31 DIAGNOSIS — F17210 Nicotine dependence, cigarettes, uncomplicated: Secondary | ICD-10-CM | POA: Diagnosis not present

## 2017-03-31 DIAGNOSIS — F1494 Cocaine use, unspecified with cocaine-induced mood disorder: Secondary | ICD-10-CM | POA: Diagnosis not present

## 2017-03-31 DIAGNOSIS — Z818 Family history of other mental and behavioral disorders: Secondary | ICD-10-CM

## 2017-03-31 DIAGNOSIS — R45851 Suicidal ideations: Secondary | ICD-10-CM

## 2017-03-31 DIAGNOSIS — F1094 Alcohol use, unspecified with alcohol-induced mood disorder: Secondary | ICD-10-CM | POA: Diagnosis present

## 2017-03-31 DIAGNOSIS — F1994 Other psychoactive substance use, unspecified with psychoactive substance-induced mood disorder: Secondary | ICD-10-CM | POA: Diagnosis present

## 2017-03-31 LAB — CBC
HEMATOCRIT: 39.4 % (ref 39.0–52.0)
HEMOGLOBIN: 12.8 g/dL — AB (ref 13.0–17.0)
MCH: 28.6 pg (ref 26.0–34.0)
MCHC: 32.5 g/dL (ref 30.0–36.0)
MCV: 87.9 fL (ref 78.0–100.0)
Platelets: 378 10*3/uL (ref 150–400)
RBC: 4.48 MIL/uL (ref 4.22–5.81)
RDW: 16.8 % — ABNORMAL HIGH (ref 11.5–15.5)
WBC: 8.7 10*3/uL (ref 4.0–10.5)

## 2017-03-31 LAB — ACETAMINOPHEN LEVEL: Acetaminophen (Tylenol), Serum: <10 ug/mL — ABNORMAL LOW (ref 10–30)

## 2017-03-31 LAB — COMPREHENSIVE METABOLIC PANEL
ALBUMIN: 4.4 g/dL (ref 3.5–5.0)
ALK PHOS: 70 U/L (ref 38–126)
ALT: 13 U/L — ABNORMAL LOW (ref 17–63)
ANION GAP: 14 (ref 5–15)
AST: 17 U/L (ref 15–41)
BILIRUBIN TOTAL: 0.4 mg/dL (ref 0.3–1.2)
BUN: 15 mg/dL (ref 6–20)
CO2: 22 mmol/L (ref 22–32)
Calcium: 9.4 mg/dL (ref 8.9–10.3)
Chloride: 106 mmol/L (ref 101–111)
Creatinine, Ser: 0.77 mg/dL (ref 0.61–1.24)
GFR calc non Af Amer: >60 mL/min (ref >60)
GLUCOSE: 90 mg/dL (ref 65–99)
Potassium: 3.8 mmol/L (ref 3.5–5.1)
Sodium: 142 mmol/L (ref 135–145)
TOTAL PROTEIN: 7.8 g/dL (ref 6.5–8.1)

## 2017-03-31 LAB — RAPID URINE DRUG SCREEN, HOSP PERFORMED
Amphetamines: NOT DETECTED
BARBITURATES: NOT DETECTED
Benzodiazepines: NOT DETECTED
Cocaine: POSITIVE — AB
OPIATES: NOT DETECTED
TETRAHYDROCANNABINOL: NOT DETECTED

## 2017-03-31 LAB — SALICYLATE LEVEL

## 2017-03-31 LAB — ETHANOL: Alcohol, Ethyl (B): 106 mg/dL — ABNORMAL HIGH (ref <5)

## 2017-03-31 MED ORDER — BUSPIRONE HCL 10 MG PO TABS
15.0000 mg | ORAL_TABLET | Freq: Once | ORAL | Status: AC
  Administered 2017-03-31: 15 mg via ORAL
  Filled 2017-03-31: qty 2

## 2017-03-31 MED ORDER — DIAZEPAM 5 MG PO TABS
5.0000 mg | ORAL_TABLET | Freq: Once | ORAL | Status: AC
  Administered 2017-03-31: 5 mg via ORAL
  Filled 2017-03-31: qty 1

## 2017-03-31 NOTE — ED Notes (Signed)
Patient discharged home per MD order.  Patient denies any thoughts of self harm.  Patient referred to [facility] for substance abuse.  Patient doesn't want any services at this time.  Patient discharged to lobby to await his mother's arrival.  Patient is ambulatory and changed patient's dressing on abdomen before departure.

## 2017-03-31 NOTE — ED Notes (Signed)
Patient denies any thoughts of self harm.  He met with treatment team and states, "I don't need any help with alcohol or drug abuse."  Patient's mother continues to call and express her concern over patient's polysubstance abuse.  Patient signed consent for nurse to talk to mother.  Explained to mother that he is being discharged today and refuses any resources for any substance abuse.  Patient's mother is attempting to get a bed for patient at [facility].  Mother is frustrated because patient refuses to get any help.  She is an hour away and is coming to get patient.  Informed mother to make sure [facility] had a bed and patient is willing to go.

## 2017-03-31 NOTE — ED Notes (Signed)
Bed: WTR6 Expected date:  Expected time:  Means of arrival:  Comments: 

## 2017-03-31 NOTE — ED Triage Notes (Signed)
Patient threaten to kill himself in front of [employer]. Patient wanted to kill hisself with pills, alcohol, and lean. Patient IVC'd by [employer]. Patient is emotional due to girlfriend left him for another man.

## 2017-03-31 NOTE — [facility] Suicide Risk Assessment (Signed)
Suicide Risk Assessment  Discharge Assessment   [facility] Discharge Suicide Risk Assessment   Principal Problem: Alcohol use with alcohol-induced mood disorder [facility]) Discharge Diagnoses:  Patient Active Problem List   Diagnosis Date Noted  . 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[place]) [F10.94] 03/31/2017    Priority: High  . Cocaine use with cocaine-induced mood disorder ([place]) [F14.94] 03/31/2017  . Chronic pain syndrome [G89.4] 02/14/2017  . PUD (peptic ulcer disease) [K27.9] 01/16/2017  . Chronic abdominal wound infection [S31.109A, L08.9] 08/16/2016  . Abdominal pain, chronic, right lower quadrant [R10.31, G89.29] 08/06/2016  . Skin ulcer of abdominal wall ([place]) [L98.499] 12/27/2015  . COPD exacerbation ([place]) [J44.1] 07/12/2015  . Abdominal pain, epigastric [R10.13] 06/20/2015  . Erectile dysfunction [N52.9] 03/14/2014  . Abdominal pain, chronic, diffuse [R10.11, G89.29] 04/15/2013  . Akathisia [G25.71] 03/02/2013  . Ventral hernia [K43.9] 01/11/2013  . Migraine headache [G43.909] 08/23/2012  . Essential hypertension, benign [I10] 06/28/2012  . Bipolar disorder ([place]) [F31.9] 06/02/2012  . Insomnia [G47.00] 04/27/2012  . Panic anxiety syndrome [F41.0] 03/21/2012  . History of colostomy [Z98.890] 03/21/2012  . History of alcohol abuse [Z87.898] 10/29/2011  . History of diverticulitis of colon [Z87.19] 10/16/2011  . Tobacco abuse [Z72.0] 10/16/2011    Total Time spent with patient: 45 minutes   Musculoskeletal: Strength & Muscle Tone: within normal limits Gait & Station: normal Patient leans: N/A  Psychiatric Specialty Exam: Physical Exam  Constitutional: He is oriented to person, place, and time. He appears well-developed and well-nourished.  HENT:  Head: Normocephalic.  Neck: Normal range of motion.  Respiratory: Effort normal.  Musculoskeletal: Normal range of motion.  Neurological: He is alert and oriented to person, place, and time.  Psychiatric: He has a  normal mood and affect. His speech is normal and behavior is normal. Judgment and thought content normal. Cognition and memory are normal.    Review of Systems  Psychiatric/Behavioral: Positive for substance abuse.  All other systems reviewed and are negative.   Blood pressure 115/68, pulse 99, temperature 98.1 F (36.7 C), temperature source Oral, resp. rate 16, SpO2 98 %.There is no height or weight on file to calculate BMI.  General Appearance: Casual  Eye Contact:  Good  Speech:  Normal Rate  Volume:  Normal  Mood:  Euthymic  Affect:  Congruent  Thought Process:  Coherent and Descriptions of Associations: Intact  Orientation:  Full (Time, Place, and Person)  Thought Content:  WDL and Logical  Suicidal Thoughts:  No  Homicidal Thoughts:  No  Memory:  Immediate;   Good Recent;   Good Remote;   Good  Judgement:  Fair  Insight:  Fair  Psychomotor Activity:  Normal  Concentration:  Concentration: Good and Attention Span: Good  Recall:  Good  Fund of Knowledge:  Fair  Language:  Good  Akathisia:  No  Handed:  Right  AIMS (if indicated):     Assets:  Housing Leisure Time Physical Health Resilience Social Support  ADL's:  Intact  Cognition:  WNL  Sleep:       Mental Status Per Nursing Assessment::   On Admission:   alcohol intoxication with suicidal ideations  Demographic Factors:  Male and Caucasian  Loss Factors: NA  Historical Factors: NA  Risk Reduction Factors:   Sense of responsibility to family, Positive social support and Positive therapeutic relationship  Continued Clinical Symptoms:  NOne  Cognitive Features That Contribute To Risk:  None    Suicide Risk:  Minimal: No identifiable suicidal ideation.  Patients presenting with no risk factors but with morbid ruminations; may be classified as minimal risk based on the severity of the depressive symptoms    Plan Of Care/Follow-up recommendations:  Activity:  as tolerated Diet:  heart healthy  diet  Otho Michalik, NP 03/31/2017, 5:06 PM

## 2017-03-31 NOTE — ED Notes (Signed)
Timothy [name] in [place] is ready for patient-security and [employer] with patient to take him to his room-[name] from security states he will wand patient after he goes to [employer]

## 2017-03-31 NOTE — ED Provider Notes (Signed)
[facility] DEPT Provider Note   CSN: [id number] Arrival date & time: 03/30/17  2301     History   Chief Complaint Chief Complaint  Patient presents with  . Suicidal  . IVC'D    HPI Timothy [name] is a 51 y.o. male.  Patient with history of bipolar, alcoholism, depression, COPD, GERD, HTN presents with [employer] under IVC for suicidal ideation. The police were called to his house by son and found to be intoxicated and crying uncontrollably, making suicidal statements. IVC petition was initiated by [employer]. The patient states he is no longer suicidal and "I just want to go home and go to bed."   The history is provided by the patient and the police. No language interpreter was used.    Past Medical History:  Diagnosis Date  . Acute bronchitis 02/15/2013  . Alcohol abuse   . Anxiety   . Bipolar disorder ([place])   . Bronchitis    history  . Bronchitis   . Cellulitis    - left knee-2009  . Chronic pain   . Condyloma 02/04/2012  . Depression   . Diverticulosis    By colonoscopy June 2005  . Dyspnea    on exertion at times  . Elevated CK 3/13  . Emphysema lung ([place])   . Emphysema of lung ([place])   . Family history of anesthesia complication    Mother N/V  . GERD (gastroesophageal reflux disease)   . Glaucoma syndrome    does not use eye drops, "They burn".  Lestine Mount)    "alot; not daily" (07/23/2012)  . Heavy smoker   . Hemorrhoids   . History of dizziness   . History of stomach ulcers 2005  . HPV (human papilloma virus) infection   . Hypertension    started 3 months ago  . Hypoglycemia   . Incisional hernia   . Lower GI bleed    June 2005. Presumably secondary to diverticulosis.  . Migraines    "not often" (07/23/2012)    Patient Active Problem List   Diagnosis Date Noted  . Chronic pain syndrome 02/14/2017  . PUD (peptic ulcer disease) 01/16/2017  . Chronic abdominal wound infection 08/16/2016  . Abdominal pain, chronic, right lower quadrant 08/06/2016  .  Skin ulcer of abdominal wall ([place]) 12/27/2015  . COPD exacerbation ([place]) 07/12/2015  . Abdominal pain, epigastric 06/20/2015  . Erectile dysfunction 03/14/2014  . Abdominal pain, chronic, diffuse 04/15/2013  . Akathisia 03/02/2013  . Ventral hernia 01/11/2013  . Migraine headache 08/23/2012  . Essential hypertension, benign 06/28/2012  . Bipolar disorder ([place]) 06/02/2012  . Insomnia 04/27/2012  . Panic anxiety syndrome 03/21/2012  . History of colostomy 03/21/2012  . History of alcohol abuse 10/29/2011  . History of diverticulitis of colon 10/16/2011  . Tobacco abuse 10/16/2011    Past Surgical History:  Procedure Laterality Date  . ABDOMINAL EXPLORATION SURGERY  07/23/2012   w/LOA (07/23/2012)  . APPENDECTOMY  1982  . COLON RESECTION  02/27/2012   Procedure: COLON RESECTION;  Surgeon: Merrie Roof, MD;  Location: [facility];  Service: General;  Laterality: N/A;  . COLOSTOMY  02/27/2012   Procedure: COLOSTOMY;  Surgeon: Merrie Roof, MD;  Location: [facility] [facility];  Service: General;  Laterality: N/A;  . COLOSTOMY TAKEDOWN  07/23/2012  . COLOSTOMY TAKEDOWN  07/23/2012   Procedure: COLOSTOMY TAKEDOWN;  Surgeon: Merrie Roof, MD;  Location: [place];  Service: General;  Laterality: N/A;  Primary Anastomosis  . ELBOW  FRACTURE SURGERY  ~ 1975   left;  pins inserted   . INSERTION OF MESH N/A 05/20/2013   Procedure: INSERTION OF MESH;  Surgeon: Merrie Roof, MD;  Location: [place];  Service: General;  Laterality: N/A;  . LAPAROSCOPIC LEFT COLON RESECTION  02/19/2012   SIGMOID  . LAPAROTOMY  02/27/2012   Procedure: EXPLORATORY LAPAROTOMY;  Surgeon: Merrie Roof, MD;  Location: [place];  Service: General;  Laterality: N/A;  . LAPAROTOMY  07/23/2012   Procedure: EXPLORATORY LAPAROTOMY;  Surgeon: Merrie Roof, MD;  Location: [place];  Service: General;  Laterality: N/A;  . LESION DESTRUCTION  07/23/2012   Procedure: DESTRUCTION LESION ANUS;  Surgeon: Merrie Roof, MD;  Location: [place];  Service: General;   Laterality: N/A;  [place]  . LYSIS OF ADHESION  07/23/2012   Procedure: LYSIS OF ADHESION;  Surgeon: Merrie Roof, MD;  Location: [place];  Service: General;  Laterality: N/A;  . VENTRAL HERNIA REPAIR  05/20/2013   Dr Marlou Starks  . VENTRAL HERNIA REPAIR N/A 05/20/2013   Procedure: VENTRAL HERNIA REPAIR ;  Surgeon: Merrie Roof, MD;  Location: [place];  Service: General;  Laterality: N/A;  . WART FULGURATION  02/19/2012   Procedure: FULGURATION ANAL WART;  Surgeon: Merrie Roof, MD;  Location: [place];  Service: General;  Laterality: N/A;  Destroy  Anal Condyloma   . WISDOM TOOTH EXTRACTION  ~ 1983  . WOUND DEBRIDEMENT N/A 06/03/2016   Procedure: ABDOMINAL WOUND EXPLORATION AND STITCH REMOVAL;  Surgeon: [name], MD;  Location: [place];  Service: General;  Laterality: N/A;  ABDOMINAL WOUND EXPLORATION AND STITCH REMOVAL       Home Medications    Prior to Admission medications   Medication Sig Start Date End Date Taking? Authorizing Provider  acetaminophen (TYLENOL) 500 MG tablet Take 1,500 mg by mouth 2 (two) times daily as needed for mild pain or headache.   Yes [provider]  albuterol (PROVENTIL HFA;VENTOLIN HFA) 108 (90 Base) MCG/ACT inhaler Inhale 2 puffs into the lungs every 6 (six) hours as needed for wheezing or shortness of breath. 12/15/16  Yes Gonfa, Taye T, MD  busPIRone (BUSPAR) 15 MG tablet Take 15 mg by mouth at bedtime. Per Dr. Tammi Klippel in [facility].  Once at night.  #30 with 11 refills.   09/25/16 08/28/17 Yes Vickii Penna, MD  cyclobenzaprine (FLEXERIL) 10 MG tablet TAKE 1 TABLET BY MOUTH THREE TIMES DAILY AS NEEDED FOR MUSCULE SPASMS 11/11/16  Yes McKeag, Marylynn Pearson, MD  diazepam (VALIUM) 5 MG tablet Take 5 mg by mouth every 12 (twelve) hours as needed for anxiety or muscle spasms.   Yes [provider]  gabapentin (NEURONTIN) 300 MG capsule Take 2 capsules (600 mg total) by mouth 3 (three) times daily. 12/12/16  Yes McKeag,  Marylynn Pearson, MD  losartan (COZAAR) 100 MG tablet Take 1 tablet (100 mg total) by mouth daily. 02/29/16  Yes McKeag, Marylynn Pearson, MD  lurasidone (LATUDA) 40 MG TABS tablet Take 40 mg by mouth daily with breakfast. Per Dr. Tammi Klippel in [facility].  #30 with 11 refills.   09/25/16 08/28/17 Yes Vickii Penna, MD  oxyCODONE-acetaminophen (PERCOCET) 10-325 MG tablet Take 1 tablet by mouth every 8 (eight) hours as needed for pain. 03/07/17  Yes Rogue Bussing, MD  pantoprazole (PROTONIX) 40 MG tablet Take 1 tablet (40 mg total) by mouth 2 (  two) times daily. 02/05/17  Yes Rogue Bussing, MD  famotidine (PEPCID) 20 MG tablet TAKE 1 TABLET BY MOUTH TWICE DAILY Patient not taking: Reported on 03/12/2017 02/14/17   Rogue Bussing, MD  ondansetron (ZOFRAN) 8 MG tablet Take 1 tablet (8 mg total) by mouth every 8 (eight) hours as needed for nausea or vomiting. Patient not taking: Reported on 03/31/2017 12/04/16   Vickii Penna, MD    Family History Family History  Problem Relation Age of Onset  . Diabetes Mother   . Parkinsonism Mother   . Depression Mother   . Alcohol abuse Father   . Hypertension Father   . Anxiety disorder Father   . Ulcers Father   . Colon polyps Father   . Breast cancer Maternal Aunt   . Diabetes Maternal Grandmother   . Stroke Neg Hx   . Heart disease Neg Hx   . Colon cancer Neg Hx   . Stomach cancer Neg Hx     Social History Social History  Substance Use Topics  . Smoking status: Current Every Day Smoker    Packs/day: 2.00    Years: 34.00    Types: Cigarettes    Start date: 07/22/1981  . Smokeless tobacco: Former Systems developer    Types: Chew    Quit date: 12/12/2010     Comment: Current 2 PPD smoker - [place]  . Alcohol use No     Allergies   Ambien [zolpidem tartrate]; Darvocet [propoxyphene n-acetaminophen]; Morphine and related; Bactroban [mupirocin calcium]; and Lisinopril   Review of Systems Review of Systems  Constitutional: Negative for chills  and fever.  HENT: Negative.   Respiratory: Negative.   Cardiovascular: Negative.   Gastrointestinal: Negative.   Musculoskeletal: Negative.   Skin: Negative.   Neurological: Negative.   Psychiatric/Behavioral: Positive for suicidal ideas.     Physical Exam Updated Vital Signs BP (!) 169/105 (BP Location: Left Arm)   Pulse (!) 108   Resp 16   SpO2 99%   Physical Exam  Constitutional: He is oriented to person, place, and time. He appears well-developed and well-nourished.  HENT:  Head: Normocephalic.  Neck: Normal range of motion. Neck supple.  Cardiovascular: Normal rate and regular rhythm.   Pulmonary/Chest: Effort normal and breath sounds normal.  Abdominal: Soft. Bowel sounds are normal. There is no tenderness. There is no rebound and no guarding.  Musculoskeletal: Normal range of motion.  Neurological: He is alert and oriented to person, place, and time.  Skin: Skin is warm and dry. No rash noted.  Psychiatric: He has a normal mood and affect. His speech is normal. He is not actively hallucinating. He expresses suicidal ideation.     ED Treatments / Results  Labs (all labs ordered are listed, but only abnormal results are displayed) Labs Reviewed  COMPREHENSIVE METABOLIC PANEL  ETHANOL  SALICYLATE LEVEL  ACETAMINOPHEN LEVEL  CBC  RAPID URINE DRUG SCREEN, HOSP PERFORMED    EKG  EKG Interpretation None       Radiology No results found.  Procedures Procedures (including critical care time)  Medications Ordered in ED Medications - No data to display   Initial Impression / Assessment and Plan / ED Course  I have reviewed the triage vital signs and the nursing notes.  Pertinent labs & imaging results that were available during my care of the patient were reviewed by me and considered in my medical decision making (see chart for details).     Patient here under IVC  by [employer] for depression and SI. Patient now denies any suicidal thoughts. Will require  evaluation by TSS to determine disposition.  Per TTS consult, patient will stay for am psych evaluation.  Final Clinical Impressions(s) / ED Diagnoses   Final diagnoses:  None   1. SI  New Prescriptions New Prescriptions   No medications on file     Charlann Lange, Hershal Coria 03/31/17 0740    Merryl Hacker, MD 04/01/17 2253

## 2017-03-31 NOTE — ED Notes (Signed)
Barbie Haggis, mother, [phone] wanted to leave contact number.

## 2017-03-31 NOTE — BH Assessment (Addendum)
Assessment Note  Timothy [name] is an 51 y.o. male, who presents involuntary and unaccompanied to [facility]. Pt reported, he and his girlfriend were arguing and the police were called. Pt reported, the police asked him if he wanted to kill himself. Pt reported, he replied,"I feel like hurting myself sometimes." Pt reported, he said it when he said that he was drunk and he police brought him to the hospital. Pt denied, SI, HI, AVH, self-injurious behaviors, and access to weapons.   Pt was IVC'd by [employer]. Per IVC paperwork: "Respondent is taken antidepressant and Diazepam, but exact diagnosis are unknown. Respondent has a plan to kill himself and expressed a wish to do so tonight by taking too many of his medications."   Pt denies abuse. Pt reported, smoking two and a half packs of cigarettes, daily. Pt reported, drinking seven beers. Pt's BAL was 106 at 0055. Pt's UDS was positive for cocaine. Pt reported, he was linked to Dr. Gwenlyn Saran at [facility] for therapy,  and Dr. Lemar Livings at [facility]. Pt denied previous inpatient admissions.   Pt presented, alert in scrubs with logical/cohernet speech. Pt's mood was pleasant. Pt's affect was flat. Pt's judgment was partial. Pt's concentration was normal. Pt's insight and impulse control are fair. Pt is oriented x4 (day, year, city and state.) Pt reported, if discharged from [facility] he could contract for safety.   Diagnosis: Bipolar 1 Disorder (HCC)                     Alcohol Use Disorder, Severe.   Past Medical History:  Past Medical History:  Diagnosis Date  . Acute bronchitis 02/15/2013  . Alcohol abuse   . Anxiety   . Bipolar disorder ([place])   . Bronchitis    history  . Bronchitis   . Cellulitis    - left knee-2009  . Chronic pain   . Condyloma 02/04/2012  . Depression   . Diverticulosis    By colonoscopy June 2005  . Dyspnea    on exertion at times  . Elevated CK 3/13  . Emphysema lung ([place])   . Emphysema of lung ([place])   . Family history of  anesthesia complication    Mother N/V  . GERD (gastroesophageal reflux disease)   . Glaucoma syndrome    does not use eye drops, "They burn".  Lestine Mount)    "alot; not daily" (07/23/2012)  . Heavy smoker   . Hemorrhoids   . History of dizziness   . History of stomach ulcers 2005  . HPV (human papilloma virus) infection   . Hypertension    started 3 months ago  . Hypoglycemia   . Incisional hernia   . Lower GI bleed    June 2005. Presumably secondary to diverticulosis.  . Migraines    "not often" (07/23/2012)    Past Surgical History:  Procedure Laterality Date  . ABDOMINAL EXPLORATION SURGERY  07/23/2012   w/LOA (07/23/2012)  . APPENDECTOMY  1982  . COLON RESECTION  02/27/2012   Procedure: COLON RESECTION;  Surgeon: Merrie Roof, MD;  Location: [place];  Service: General;  Laterality: N/A;  . COLOSTOMY  02/27/2012   Procedure: COLOSTOMY;  Surgeon: Merrie Roof, MD;  Location: [place];  Service: General;  Laterality: N/A;  . COLOSTOMY TAKEDOWN  07/23/2012  . COLOSTOMY TAKEDOWN  07/23/2012   Procedure: COLOSTOMY TAKEDOWN;  Surgeon: Merrie Roof, MD;  Location: [place];  Service: General;  Laterality:  N/A;  Primary Anastomosis  . ELBOW FRACTURE SURGERY  ~ 1975   left;  pins inserted   . INSERTION OF MESH N/A 05/20/2013   Procedure: INSERTION OF MESH;  Surgeon: Merrie Roof, MD;  Location: [place];  Service: General;  Laterality: N/A;  . LAPAROSCOPIC LEFT COLON RESECTION  02/19/2012   SIGMOID  . LAPAROTOMY  02/27/2012   Procedure: EXPLORATORY LAPAROTOMY;  Surgeon: Merrie Roof, MD;  Location: [place];  Service: General;  Laterality: N/A;  . LAPAROTOMY  07/23/2012   Procedure: EXPLORATORY LAPAROTOMY;  Surgeon: Merrie Roof, MD;  Location: [place];  Service: General;  Laterality: N/A;  . LESION DESTRUCTION  07/23/2012   Procedure: DESTRUCTION LESION ANUS;  Surgeon: Merrie Roof, MD;  Location: [place];  Service: General;  Laterality: N/A;  [place]  . LYSIS OF  ADHESION  07/23/2012   Procedure: LYSIS OF ADHESION;  Surgeon: Merrie Roof, MD;  Location: [place];  Service: General;  Laterality: N/A;  . VENTRAL HERNIA REPAIR  05/20/2013   Dr Marlou Starks  . VENTRAL HERNIA REPAIR N/A 05/20/2013   Procedure: VENTRAL HERNIA REPAIR ;  Surgeon: Merrie Roof, MD;  Location: [place];  Service: General;  Laterality: N/A;  . WART FULGURATION  02/19/2012   Procedure: FULGURATION ANAL WART;  Surgeon: Merrie Roof, MD;  Location: [place];  Service: General;  Laterality: N/A;  Destroy  Anal Condyloma   . WISDOM TOOTH EXTRACTION  ~ 1983  . WOUND DEBRIDEMENT N/A 06/03/2016   Procedure: ABDOMINAL WOUND EXPLORATION AND STITCH REMOVAL;  Surgeon: [name], MD;  Location: [place];  Service: General;  Laterality: N/A;  ABDOMINAL WOUND EXPLORATION AND STITCH REMOVAL    Family History:  Family History  Problem Relation Age of Onset  . Diabetes Mother   . Parkinsonism Mother   . Depression Mother   . Alcohol abuse Father   . Hypertension Father   . Anxiety disorder Father   . Ulcers Father   . Colon polyps Father   . Breast cancer Maternal Aunt   . Diabetes Maternal Grandmother   . Stroke Neg Hx   . Heart disease Neg Hx   . Colon cancer Neg Hx   . Stomach cancer Neg Hx     Social History:  reports that he has been smoking Cigarettes.  He started smoking about 35 years ago. He has a 68.00 pack-year smoking history. He quit smokeless tobacco use about 6 years ago. His smokeless tobacco use included Chew. He reports that he uses drugs, including Oxycodone. He reports that he does not drink alcohol.  Additional Social History:  Alcohol / Drug Use Pain Medications: See MAR Prescriptions: See MAR Over the Counter: See MAR History of alcohol / drug use?: Yes Substance #1 Name of Substance 1: Cigarette 1 - Age of First Use: UTA 1 - Amount (size/oz): Pt reported, smoking two and a half pack of cigarettes, daily.  1 - Frequency: UTA 1 - Duration:  Ongoing. 1 - Last Use / Amount: Pt reported, daily.  Substance #2 Name of Substance 2: Alochol 2 - Age of First Use: UTA 2 - Amount (size/oz): Pt reported, drinking seven beers, last night.  2 - Frequency: UTA 2 - Duration: UTA 2 - Last Use / Amount: Pt reported, last night.  Substance #3 Name of Substance 3: Cocaine 3 - Age of First Use: UTA 3 - Amount (size/oz): Pt's  UDS is positive for cocaine. 3 - Frequency: UTA 3 - Duration: UTA 3 - Last Use / Amount: UTA  CIWA: CIWA-Ar BP: (!) 169/105 Pulse Rate: (!) 108 COWS:    Allergies:  Allergies  Allergen Reactions  . Ambien [Zolpidem Tartrate] Other (See Comments)    Caused agitation of mood.   [name] [Propoxyphene N-Acetaminophen] Hives and Itching  . Morphine And Related Itching  . Bactroban [Mupirocin Calcium] Swelling    Caused infection at a surgical site  . Lisinopril Cough    Home Medications:  (Not in a hospital admission)  OB/GYN Status:  No LMP for male patient.  General Assessment Data Location of Assessment: [facility] ED TTS Assessment: In system Is this a Tele or Face-to-Face Assessment?: Face-to-Face Is this an Initial Assessment or a Re-assessment for this encounter?: Initial Assessment Marital status: Single Living Arrangements: Children Can pt return to current living arrangement?: Yes Admission Status: Involuntary Referral Source: Other ([employer]) Insurance type: Medicaid     Crisis Care Plan Living Arrangements: Children Legal Guardian: Other: (Self) Name of Psychiatrist: Dr. Lemar Livings at [facility].  Name of Therapist: Dr. Gwenlyn Saran at [facility].   Education Status Is patient currently in school?: No Current Grade: NA Highest grade of school patient has completed: 10th grade.  Name of school: NA Contact person: NA  Risk to self with the past 6 months Suicidal Ideation: Yes-Currently Present (Per IVC, pt denies. ) Has patient been a risk to self within the past 6 months prior to  admission? : Yes (Per IVC, pt denies. ) Suicidal Intent: Yes-Currently Present (Per IVC, pt denies. ) Has patient had any suicidal intent within the past 6 months prior to admission? : No (Pt denies. ) Is patient at risk for suicide?: Yes (Per IVC, pt denies. ) Suicidal Plan?: Yes-Currently Present (Per IVC, pt denies. ) Has patient had any suicidal plan within the past 6 months prior to admission? : No (Pt denies. ) Specify Current Suicidal Plan: Per IVC, pt had a plan to kill himself by overdosing on medications.  Access to Means: Yes Specify Access to Suicidal Means: Pt has access to medications.  What has been your use of drugs/alcohol within the last 12 months?: Alcohol, cigarettes, cocaine.  Previous Attempts/Gestures: No (Pt denies. ) How many times?: 0 Other Self Harm Risks: Pt denies.  Triggers for Past Attempts: None known Intentional Self Injurious Behavior: None (Pt denies. ) Family Suicide History: No Recent stressful life event(s): Other (Comment) (UTA) Persecutory voices/beliefs?: No Depression: Yes Depression Symptoms: Feeling angry/irritable, Feeling worthless/self pity, Loss of interest in usual pleasures, Guilt, Isolating Substance abuse history and/or treatment for substance abuse?: Yes Suicide prevention information given to non-admitted patients: Not applicable  Risk to Others within the past 6 months Homicidal Ideation: No (Pt denies. ) Does patient have any lifetime risk of violence toward others beyond the six months prior to admission? : No Thoughts of Harm to Others: No Current Homicidal Intent: No Current Homicidal Plan: No Access to Homicidal Means: No Identified Victim: NA History of harm to others?: No Assessment of Violence: None Noted Violent Behavior Description: NA Does patient have access to weapons?: No (Pt denies. ) Criminal Charges Pending?: No Does patient have a court date: No Is patient on probation?: No  Psychosis Hallucinations: None  noted Delusions: None noted  Mental Status Report Appearance/Hygiene: In scrubs Eye Contact: Good Motor Activity: Unremarkable Speech: Logical/coherent Level of Consciousness: Alert Mood: Pleasant Affect: Flat Anxiety Level: Minimal Thought  Processes: Coherent, Relevant Judgement: Partial Orientation: Other (Comment) (day, year, city and state. ) Obsessive Compulsive Thoughts/Behaviors: None  Cognitive Functioning Concentration: Normal Memory: Recent Intact IQ: Average Insight: Fair Impulse Control: Fair Appetite: Good Sleep: Unable to Assess Total Hours of Sleep:  (Pt reported, it depends. )  ADLScreening [facility] Assessment Services) Patient's cognitive ability adequate to safely complete daily activities?: Yes Patient able to express need for assistance with ADLs?: Yes Independently performs ADLs?: Yes (appropriate for developmental age)  Prior Inpatient Therapy Prior Inpatient Therapy: No Prior Therapy Dates: NA Prior Therapy Facilty/Provider(s): NA Reason for Treatment: NA  Prior Outpatient Therapy Prior Outpatient Therapy: Yes Prior Therapy Dates: Current Prior Therapy Facilty/Provider(s): Dr[facility] at [facility].  Reason for Treatment: Therapy Does patient have an ACCT team?: No Does patient have Intensive In-House Services?  : No Does patient have [facility] services? : No Does patient have P4CC services?: No  ADL Screening (condition at time of admission) Patient's cognitive ability adequate to safely complete daily activities?: Yes Is the patient deaf or have difficulty hearing?: No Does the patient have difficulty seeing, even when wearing glasses/contacts?: Yes (Pt reported, wearing reading glasses. ) Does the patient have difficulty concentrating, remembering, or making decisions?: Yes Patient able to express need for assistance with ADLs?: Yes Does the patient have difficulty dressing or bathing?: No Independently performs ADLs?: Yes  (appropriate for developmental age) Does the patient have difficulty walking or climbing stairs?: No Weakness of Legs: None Weakness of Arms/Hands: None       Abuse/Neglect Assessment (Assessment to be complete while patient is alone) Physical Abuse: Denies (Pt denies. ) Verbal Abuse: Denies (Pt denies, ) Sexual Abuse: Denies (Pt denies.) Exploitation of patient/patient's resources: Denies (Pt denies.) Self-Neglect: Denies (Pt denies. )     Advance Directives ([place]) Does Patient Have a Medical Advance Directive?: No Would patient like information on creating a medical advance directive?: No - Patient declined    Additional Information 1:1 In Past 12 Months?: No CIRT Risk: No Elopement Risk: No Does patient have medical clearance?: Yes     Disposition: Lindon Romp, NP recommends overnight observation and re-evaluation in the morning. Disposition discussed with Nehemiah Settle, PA and  Bethena Roys, RN.    Disposition Initial Assessment Completed for this Encounter: Yes Disposition of Patient: Other dispositions (AM Psychiatric Evaluation.) Other disposition(s): Other (Comment) (AM Psychiatric Evaluation. )  On Site Evaluation by:   Reviewed with Physician:  Nehemiah Settle, PA and Lindon Romp, NP.  Vertell Novak 03/31/2017 4:34 AM  Vertell Novak, MS, [facility], [facility] Triage Specialist [phone]

## 2017-03-31 NOTE — ED Notes (Signed)
SBAR Report received from previous nurse. Pt received angry on unit. Pt denies current SI/ HI, A/V H, depression, anxiety, or pain at this time, and appears otherwise stable and free of distress. Pt asking who took out IVC papers on him and was satisfied with writers explanation of why we cannot give out that information.   Pt reminded of camera surveillance, q 15 min rounds, and rules of the milieu. Will continue to assess.

## 2017-03-31 NOTE — Consult Note (Signed)
[facility] Consult   Reason for Consult:  Alcohol intoxication with suicide threat Referring Physician:  EDP Patient Identification: Timothy [name] MRN:  [id number] Principal Diagnosis: Alcohol use with alcohol-induced mood disorder [employer] [employer]) Diagnosis:   Patient Active Problem List   Diagnosis Date Noted  . 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[place]) [F10.94] 03/31/2017    Priority: High  . Cocaine use with cocaine-induced mood disorder ([place]) [F14.94] 03/31/2017  . Chronic pain syndrome [G89.4] 02/14/2017  . PUD (peptic ulcer disease) [K27.9] 01/16/2017  . Chronic abdominal wound infection [S31.109A, L08.9] 08/16/2016  . Abdominal pain, chronic, right lower quadrant [R10.31, G89.29] 08/06/2016  . Skin ulcer of abdominal wall ([place]) [L98.499] 12/27/2015  . COPD exacerbation ([place]) [J44.1] 07/12/2015  . Abdominal pain, epigastric [R10.13] 06/20/2015  . Erectile dysfunction [N52.9] 03/14/2014  . Abdominal pain, chronic, diffuse [R10.11, G89.29] 04/15/2013  . Akathisia [G25.71] 03/02/2013  . Ventral hernia [K43.9] 01/11/2013  . Migraine headache [G43.909] 08/23/2012  . Essential hypertension, benign [I10] 06/28/2012  . Bipolar disorder ([place]) [F31.9] 06/02/2012  . Insomnia [G47.00] 04/27/2012  . Panic anxiety syndrome [F41.0] 03/21/2012  . History of colostomy [Z98.890] 03/21/2012  . History of alcohol abuse [Z87.898] 10/29/2011  . History of diverticulitis of colon [Z87.19] 10/16/2011  . Tobacco abuse [Z72.0] 10/16/2011    Total Time spent with patient: 45 minutes  Subjective:   Timothy [name] is a 51 y.o. male patient does not warrant admission.  HPI:  51 yo male who presented to the ED after drinking and having suicidal ideations.  Today, he is clear and coherent with no suicidal/homicidal ideations, hallucinations, or withdrawal symptoms. He currently is being treated by Dr. Gwenlyn Saran and has a therapist; also has medications. Stable for discharge.    Past  Psychiatric History: depression, substance abuse  Risk to Self: None Risk to Others: Homicidal Ideation: No (Pt denies. ) Thoughts of Harm to Others: No Current Homicidal Intent: No Current Homicidal Plan: No Access to Homicidal Means: No Identified Victim: NA History of harm to others?: No Assessment of Violence: None Noted Violent Behavior Description: NA Does patient have access to weapons?: No (Pt denies. ) Criminal Charges Pending?: No Does patient have a court date: No Prior Inpatient Therapy: Prior Inpatient Therapy: No Prior Therapy Dates: NA Prior Therapy Facilty/Provider(s): NA Reason for Treatment: NA Prior Outpatient Therapy: Prior Outpatient Therapy: Yes Prior Therapy Dates: Current Prior Therapy Facilty/Provider(s): Dr[facility] G[facility]lyn Saran at [facility]  Reason for Treatment: Therapy Does patient have an ACCT team?: No Does patient have Intensive In-House Services?  : No Does patient have [facility] services? : No Does patient have P4CC services?: No  Past Medical History:  Past Medical History:  Diagnosis Date  . Acute bronchitis 02/15/2013  . Alcohol abuse   . Anxiety   . Bipolar disorder ([place])   . Bronchitis    history  . Bronchitis   . Cellulitis    - left knee-2009  . Chronic pain   . Condyloma 02/04/2012  . Depression   . Diverticulosis    By colonoscopy June 2005  . Dyspnea    on exertion at times  . Elevated CK 3/13  . Emphysema lung ([place])   . Emphysema of lung ([place])   . Family history of anesthesia complication    Mother N/V  . GERD (gastroesophageal reflux disease)   . Glaucoma syndrome    does not use eye drops, "They burn".  [facility] [place])    "alot; not daily" (07/23/2012)  .  Heavy smoker   . Hemorrhoids   . History of dizziness   . History of stomach ulcers 2005  . HPV (human papilloma virus) infection   . Hypertension    started 3 months ago  . Hypoglycemia   . Incisional hernia   . Lower GI bleed    June 2005. Presumably  secondary to diverticulosis.  . Migraines    "not often" (07/23/2012)    Past Surgical History:  Procedure Laterality Date  . ABDOMINAL EXPLORATION SURGERY  07/23/2012   w/LOA (07/23/2012)  . APPENDECTOMY  1982  . COLON RESECTION  02/27/2012   Procedure: COLON RESECTION;  Surgeon: Merrie Roof, MD;  Location: [place];  Service: General;  Laterality: N/A;  . COLOSTOMY  02/27/2012   Procedure: COLOSTOMY;  Surgeon: Merrie Roof, MD;  Location: [place];  Service: General;  Laterality: N/A;  . COLOSTOMY TAKEDOWN  07/23/2012  . COLOSTOMY TAKEDOWN  07/23/2012   Procedure: COLOSTOMY TAKEDOWN;  Surgeon: Merrie Roof, MD;  Location: [place];  Service: General;  Laterality: N/A;  Primary Anastomosis  . ELBOW FRACTURE SURGERY  ~ 1975   left;  pins inserted   . INSERTION OF MESH N/A 05/20/2013   Procedure: INSERTION OF MESH;  Surgeon: Merrie Roof, MD;  Location: [place];  Service: General;  Laterality: N/A;  . LAPAROSCOPIC LEFT COLON RESECTION  02/19/2012   SIGMOID  . LAPAROTOMY  02/27/2012   Procedure: EXPLORATORY LAPAROTOMY;  Surgeon: Merrie Roof, MD;  Location: [place];  Service: General;  Laterality: N/A;  . LAPAROTOMY  07/23/2012   Procedure: EXPLORATORY LAPAROTOMY;  Surgeon: Merrie Roof, MD;  Location: [place];  Service: General;  Laterality: N/A;  . LESION DESTRUCTION  07/23/2012   Procedure: DESTRUCTION LESION ANUS;  Surgeon: Merrie Roof, MD;  Location: [place];  Service: General;  Laterality: N/A;  [place]  . LYSIS OF ADHESION  07/23/2012   Procedure: LYSIS OF ADHESION;  Surgeon: Merrie Roof, MD;  Location: [place];  Service: General;  Laterality: N/A;  . VENTRAL HERNIA REPAIR  05/20/2013   Dr Marlou Starks  . VENTRAL HERNIA REPAIR N/A 05/20/2013   Procedure: VENTRAL HERNIA REPAIR ;  Surgeon: Merrie Roof, MD;  Location: [place];  Service: General;  Laterality: N/A;  . WART FULGURATION  02/19/2012   Procedure: FULGURATION ANAL WART;  Surgeon: Merrie Roof, MD;  Location: [place];   Service: General;  Laterality: N/A;  Destroy  Anal Condyloma   . WISDOM TOOTH EXTRACTION  ~ 1983  . WOUND DEBRIDEMENT N/A 06/03/2016   Procedure: ABDOMINAL WOUND EXPLORATION AND STITCH REMOVAL;  Surgeon: [name], MD;  Location: [place];  Service: General;  Laterality: N/A;  ABDOMINAL WOUND EXPLORATION AND STITCH REMOVAL   Family History:  Family History  Problem Relation Age of Onset  . Diabetes Mother   . Parkinsonism Mother   . Depression Mother   . Alcohol abuse Father   . Hypertension Father   . Anxiety disorder Father   . Ulcers Father   . Colon polyps Father   . Breast cancer Maternal Aunt   . Diabetes Maternal Grandmother   . Stroke Neg Hx   . Heart disease Neg Hx   . Colon cancer Neg Hx   . Stomach cancer Neg Hx    Family Psychiatric  History: none  Social History:  History  Alcohol Use No  History  Drug Use  . Types: Oxycodone    Social History   Social History  . Marital status: Legally Separated    Spouse name: N/A  . Number of children: 3  . Years of education: N/A   Occupational History  . unemployed    Social History Main Topics  . Smoking status: Current Every Day Smoker    Packs/day: 2.00    Years: 34.00    Types: Cigarettes    Start date: 07/22/1981  . Smokeless tobacco: Former Systems developer    Types: Chew    Quit date: 12/12/2010     Comment: Current 2 PPD smoker - [place]  . Alcohol use No  . Drug use: Yes    Types: Oxycodone  . Sexual activity: Not Asked   Other Topics Concern  . None   Social History Narrative   Lives with wife [place], Children Gwen (age 61) Sterling Big (age 39) [name] (age 41).  Unemployed less than high school education.  Updated 10/29/11   Additional Social History:    Allergies:   Allergies  Allergen Reactions  . Ambien [Zolpidem Tartrate] Other (See Comments)    Caused agitation of mood.   [name] [Propoxyphene N-Acetaminophen] Hives and Itching  . Morphine And Related Itching  .  Bactroban [Mupirocin Calcium] Swelling    Caused infection at a surgical site  . Lisinopril Cough    Labs:  Results for orders placed or performed during the hospital encounter of 03/31/17 (from the past 48 hour(s))  Comprehensive metabolic panel     Status: Abnormal   Collection Time: 03/31/17 12:55 AM  Result Value Ref Range   Sodium 142 135 - 145 mmol/L   Potassium 3.8 3.5 - 5.1 mmol/L   Chloride 106 101 - 111 mmol/L   CO2 22 22 - 32 mmol/L   Glucose, Bld 90 65 - 99 mg/dL   BUN 15 6 - 20 mg/dL   Creatinine, Ser 0.77 0.61 - 1.24 mg/dL   Calcium 9.4 8.9 - 10.3 mg/dL   Total Protein 7.8 6.5 - 8.1 g/dL   Albumin 4.4 3.5 - 5.0 g/dL   AST 17 15 - 41 U/L   ALT 13 (L) 17 - 63 U/L   Alkaline Phosphatase 70 38 - 126 U/L   Total Bilirubin 0.4 0.3 - 1.2 mg/dL   GFR calc non Af Amer >60 >60 mL/min   GFR calc Af Amer >60 >60 mL/min    Comment: (NOTE) The eGFR has been calculated using the CKD EPI equation. This calculation has not been validated in all clinical situations. eGFR's persistently <60 mL/min signify possible Chronic Kidney Disease.    Anion gap 14 5 - 15  Ethanol     Status: Abnormal   Collection Time: 03/31/17 12:55 AM  Result Value Ref Range   Alcohol, Ethyl (B) 106 (H) <5 mg/dL    Comment:        LOWEST DETECTABLE LIMIT FOR SERUM ALCOHOL IS 5 mg/dL FOR MEDICAL PURPOSES ONLY   Salicylate level     Status: None   Collection Time: 03/31/17 12:55 AM  Result Value Ref Range   Salicylate Lvl <6.1 2.8 - 30.0 mg/dL  Acetaminophen level     Status: Abnormal   Collection Time: 03/31/17 12:55 AM  Result Value Ref Range   Acetaminophen (Tylenol), Serum <10 (L) 10 - 30 ug/mL    Comment:        THERAPEUTIC CONCENTRATIONS VARY SIGNIFICANTLY. A RANGE OF 10-30 ug/mL MAY BE  AN EFFECTIVE CONCENTRATION FOR MANY PATIENTS. HOWEVER, SOME ARE BEST TREATED AT CONCENTRATIONS OUTSIDE THIS RANGE. ACETAMINOPHEN CONCENTRATIONS >150 ug/mL AT 4 HOURS AFTER INGESTION AND >50 ug/mL AT  12 HOURS AFTER INGESTION ARE OFTEN ASSOCIATED WITH TOXIC REACTIONS.   cbc     Status: Abnormal   Collection Time: 03/31/17 12:55 AM  Result Value Ref Range   WBC 8.7 4.0 - 10.5 K/uL   RBC 4.48 4.22 - 5.81 MIL/uL   Hemoglobin 12.8 (L) 13.0 - 17.0 g/dL   HCT 39.4 39.0 - 52.0 %   MCV 87.9 78.0 - 100.0 fL   MCH 28.6 26.0 - 34.0 pg   MCHC 32.5 30.0 - 36.0 g/dL   RDW 16.8 (H) 11.5 - 15.5 %   Platelets 378 150 - 400 K/uL  Rapid urine drug screen (hospital performed)     Status: Abnormal   Collection Time: 03/31/17 12:57 AM  Result Value Ref Range   Opiates NONE DETECTED NONE DETECTED   Cocaine POSITIVE (A) NONE DETECTED   Benzodiazepines NONE DETECTED NONE DETECTED   Amphetamines NONE DETECTED NONE DETECTED   Tetrahydrocannabinol NONE DETECTED NONE DETECTED   Barbiturates NONE DETECTED NONE DETECTED    Comment:        DRUG SCREEN FOR MEDICAL PURPOSES ONLY.  IF CONFIRMATION IS NEEDED FOR ANY PURPOSE, NOTIFY LAB WITHIN 5 DAYS.        LOWEST DETECTABLE LIMITS FOR URINE DRUG SCREEN Drug Class       Cutoff (ng/mL) Amphetamine      1000 Barbiturate      200 Benzodiazepine   825 Tricyclics       003 Opiates          300 Cocaine          300 THC              50     Current Facility-Administered Medications  Medication Dose Route Frequency Provider Last Rate Last Dose  . 0.9 %  sodium chloride infusion  500 mL Intravenous Continuous Ladene Artist, MD       Current Outpatient Prescriptions  Medication Sig Dispense Refill  . acetaminophen (TYLENOL) 500 MG tablet Take 1,500 mg by mouth 2 (two) times daily as needed for mild pain or headache.    . albuterol (PROVENTIL HFA;VENTOLIN HFA) 108 (90 Base) MCG/ACT inhaler Inhale 2 puffs into the lungs every 6 (six) hours as needed for wheezing or shortness of breath. 1 Inhaler 1  . busPIRone (BUSPAR) 15 MG tablet Take 15 mg by mouth at bedtime. Per Dr. Tammi Klippel in [facility].  Once at night.  #30 with 11 refills.      . cyclobenzaprine  (FLEXERIL) 10 MG tablet TAKE 1 TABLET BY MOUTH THREE TIMES DAILY AS NEEDED FOR MUSCULE SPASMS 30 tablet 3  . diazepam (VALIUM) 5 MG tablet Take 5 mg by mouth every 12 (twelve) hours as needed for anxiety or muscle spasms.    Marland Kitchen gabapentin (NEURONTIN) 300 MG capsule Take 2 capsules (600 mg total) by mouth 3 (three) times daily. 180 capsule 2  . losartan (COZAAR) 100 MG tablet Take 1 tablet (100 mg total) by mouth daily. 90 tablet 3  . lurasidone (LATUDA) 40 MG TABS tablet Take 40 mg by mouth daily with breakfast. Per Dr. Tammi Klippel in [facility].  #30 with 11 refills.      Marland Kitchen oxyCODONE-acetaminophen (PERCOCET) 10-325 MG tablet Take 1 tablet by mouth every 8 (eight) hours as needed for pain. 75 tablet  0  . pantoprazole (PROTONIX) 40 MG tablet Take 1 tablet (40 mg total) by mouth 2 (two) times daily. 60 tablet 11  . famotidine (PEPCID) 20 MG tablet TAKE 1 TABLET BY MOUTH TWICE DAILY (Patient not taking: Reported on 03/12/2017) 180 tablet 1  . ondansetron (ZOFRAN) 8 MG tablet Take 1 tablet (8 mg total) by mouth every 8 (eight) hours as needed for nausea or vomiting. (Patient not taking: Reported on 03/31/2017) 90 tablet 1    Musculoskeletal: Strength & Muscle Tone: within normal limits Gait & Station: normal Patient leans: N/A  Psychiatric Specialty Exam: Physical Exam  Constitutional: He is oriented to person, place, and time. He appears well-developed and well-nourished.  HENT:  Head: Normocephalic.  Neck: Normal range of motion.  Respiratory: Effort normal.  Musculoskeletal: Normal range of motion.  Neurological: He is alert and oriented to person, place, and time.  Psychiatric: He has a normal mood and affect. His speech is normal and behavior is normal. Judgment and thought content normal. Cognition and memory are normal.    Review of Systems  Psychiatric/Behavioral: Positive for substance abuse.  All other systems reviewed and are negative.   Blood pressure 115/68, pulse 99, temperature  98.1 F (36.7 C), temperature source Oral, resp. rate 16, SpO2 98 %.There is no height or weight on file to calculate BMI.  General Appearance: Casual  Eye Contact:  Good  Speech:  Normal Rate  Volume:  Normal  Mood:  Euthymic  Affect:  Congruent  Thought Process:  Coherent and Descriptions of Associations: Intact  Orientation:  Full (Time, Place, and Person)  Thought Content:  WDL and Logical  Suicidal Thoughts:  No  Homicidal Thoughts:  No  Memory:  Immediate;   Good Recent;   Good Remote;   Good  Judgement:  Fair  Insight:  Fair  Psychomotor Activity:  Normal  Concentration:  Concentration: Good and Attention Span: Good  Recall:  Good  Fund of Knowledge:  Fair  Language:  Good  Akathisia:  No  Handed:  Right  AIMS (if indicated):     Assets:  Housing Leisure Time Physical Health Resilience Social Support  ADL's:  Intact  Cognition:  WNL  Sleep:        Treatment Plan Summary: Daily contact with patient to assess and evaluate symptoms and progress in treatment, Medication management and Plan alcohol abuse with alcohol induced mood disorder:  -Crisis stabilization -Medication management:  IV fluids provided initially, no medications started as he needed to clear -Individual and substance abuse counseling  Disposition: No evidence of imminent risk to self or others at present.    Waylan Boga, NP 03/31/2017 10:47 AM  Patient seen face-to-face for psychiatric evaluation, chart reviewed and case discussed with the physician extender and developed treatment plan. Reviewed the information documented and agree with the treatment plan. Corena Pilgrim, MD

## 2017-03-31 NOTE — Discharge Instructions (Signed)
To help you maintain a sober lifestyle, a substance abuse treatment program may be beneficial to you.  Contact [facility] at your earliest opportunity to ask about enrolling in their program: ° °     [facility] ([facility] °     [address] °     [place], [place] [address] °     [phone] °     New patients are seen at the walk-in clinic every Tuesday from 9:00 am - 12:00 pm °

## 2017-03-31 NOTE — BH Assessment (Signed)
[facility] Assessment Progress Note  Per Corena Pilgrim, MD, this pt does not require psychiatric hospitalization at this time.  Pt presents under IVC initiated by law enforcement, which Dr Darleene Cleaver has rescinded.  Pt is to be [facility] from [facility] with recommendation to follow up with [facility].  This has been included in pt's discharge instructions.  Pt's nurse, Chrys Racer, has been notified.  Jalene Mullet, [place] Triage Specialist [phone]

## 2017-04-01 ENCOUNTER — Telehealth: Payer: Self-pay | Admitting: Psychology

## 2017-04-01 NOTE — Telephone Encounter (Signed)
Saw note from Dr. Burr Medico and ED note.  Called to check in on him and left a VM.

## 2017-04-08 ENCOUNTER — Telehealth: Payer: Self-pay | Admitting: Psychology

## 2017-04-08 NOTE — Telephone Encounter (Signed)
Patient scheduled for [facility] tomorrow at 9:00.  Dr. Tammi Klippel has a conflict.  Called and left a VM to cancel appointment but encouraged Timothy [name] to call to be seen by me individually tomorrow.  He had at least two appointments scheduled to see me individually but did not show for either.  I suspect his substance abuse is the reason.  Will continue to reach out to try to get him reconnected.

## 2017-04-09 ENCOUNTER — Ambulatory Visit: Payer: Medicaid Other | Admitting: Psychology

## 2017-04-11 ENCOUNTER — Encounter: Payer: Self-pay | Admitting: Internal Medicine

## 2017-04-11 ENCOUNTER — Ambulatory Visit (INDEPENDENT_AMBULATORY_CARE_PROVIDER_SITE_OTHER): Payer: Medicaid Other | Admitting: Internal Medicine

## 2017-04-11 ENCOUNTER — Other Ambulatory Visit: Payer: Self-pay | Admitting: Internal Medicine

## 2017-04-11 VITALS — BP 160/82 | HR 89 | Temp 97.8°F | Ht 69.5 in | Wt 152.6 lb

## 2017-04-11 DIAGNOSIS — F39 Unspecified mood [affective] disorder: Secondary | ICD-10-CM | POA: Diagnosis not present

## 2017-04-11 DIAGNOSIS — G894 Chronic pain syndrome: Secondary | ICD-10-CM | POA: Diagnosis present

## 2017-04-11 MED ORDER — DOXYCYCLINE HYCLATE 100 MG PO TABS
100.0000 mg | ORAL_TABLET | Freq: Two times a day (BID) | ORAL | 0 refills | Status: DC
Start: 2017-04-11 — End: 2017-10-01

## 2017-04-11 MED ORDER — OXYCODONE-ACETAMINOPHEN 10-325 MG PO TABS: 1.0000 | ORAL_TABLET | Freq: Three times a day (TID) | ORAL | 0 refills | Status: DC | PRN

## 2017-04-11 MED ORDER — DIAZEPAM 5 MG PO TABS: 5.0000 mg | ORAL_TABLET | Freq: Two times a day (BID) | ORAL | 0 refills | Status: DC | PRN

## 2017-04-11 NOTE — Progress Notes (Signed)
[name] Progress Note  Subjective:  Timothy [name] is a 51 y.o. male with history of bipolar disorder, HTN, PUD, COPD, tobacco abuse, chronic abdominal pain s/p multiple abdominal surgeries and persistent wound after ventral hernia repair who presents for chronic pain.   #Abdominal pain: - Patient reports using 0-3 oxycodone-acetaminophen 10-325 mg a day; occasionally takes gabapentin and flexeril - Abdomen hurting more for last day or two with increased drainage - Says he has called his surgeon Dr. Marlou Starks and waiting for appointment to discuss repeat surgery of abdominal wound - On a positive note, patient says he has been eating more regularly and has gained weight. ROS: No fevers, no dark stools, no n/v/d  Mood disorder: - Patient reports difficulty with his mood lately. He was seen in ED 9/10 for passive suicidal ideation. He had cocaine on his UDS at that time. He also had been drinking. He had another ED visit 03/12/17 where he says he had taken ecstasy recently. Of note UDS 03/31/17 was negative for opioids; patient says he was not taking a lot of his medications at that time, including his pain and bipolar medication. He says he plans to start going to a substance abuse support group next week. However, he says he normally doesn't do drugs like cocaine and ecstasy; could not explain motivation for taking these.  - He has missed multiple appointments with [facility] lately, which is unlike him. Has been out of valium since earlier this month due to missed [facility] appointments and feels somewhat tremulous. Says increased stressor has been fears that his girlfriend is cheating on him.  - Says taking latuda and buspar ROS: No SI/HI  Social: Current smoker  Allergies  Allergen Reactions  . Ambien [Zolpidem Tartrate] Other (See Comments)    Caused agitation of mood.   [name] [Propoxyphene N-Acetaminophen] Hives and Itching  . Morphine And Related Itching  . Bactroban [Mupirocin  Calcium] Swelling    Caused infection at a surgical site  . Lisinopril Cough    Objective: Blood pressure (!) 160/82, pulse 89, temperature 97.8 F (36.6 C), temperature source Oral, height 5' 9.5" (1.765 m), weight 152 lb 9.6 oz (69.2 kg), SpO2 98 %. Constitutional: Thin male in NAD, hands slightly tremulous HENT: MMM Cardiovascular: RRR, S1, S2, no m/r/g.  Pulmonary/Chest: Effort normal. Abdominal: Soft. +BS, mildly tender around chronic abdominal wound Neurological: AOx3 Skin: Increased drainage from ventral abdominal wound but no surrounding erythema Vitals reviewed  Assessment/Plan: Chronic pain syndrome - Secondary to chronic abdominal wound. Patient reports increased pain with increased drainage of wound. No erythema of surrounding skin, patient tolerating PO well, and no fever to suggest cellulitis or systemic infection. - Will start tapering patient off opioid pain medication due to recent substance abuse; given average use of 14 pills a week and decreasing by 10% a week will give 12, 11, 10, 9 pills per next 4 weeks. Explained to patient safety reasons for doing this and will need closer follow up with him.  - Prescribed doxycycline 100 mg BID x 10 days for increased pain and drainage - Patient scheduling surgery follow-up to determine long-term plan - Recommended taking gabapentin regularly and tylenol as needed  - Reviewed [facility]'s pain contract, which patient had previously signed  Indication for chronic opioid: chronic painful abdominal wound Medication and dose: oxycodone-acetaminophen 10-325 mg # pills per month: 42 (tapering down by 10% a week) Last UDS date: 03/31/17 Pain contract signed (Y/N): Y Date narcotic database last reviewed (  include red flags): 04/11/17; appropriate refill activity but known substance abuse as noted above   Mood disorder [facility]) - Patient back on his latuda and buspar and not endorsing SI/HI. - Encouraged patient to make appointment with Dr.  Gwenlyn Saran. Emphasized that he can coordinate visits with me and [name] to avoid multiple trips to [facility] and improve adherence to psychiatric medications. - After speaking with Dr. Gwenlyn Saran, agreed to take over valium prescription, as she and Dr. Tammi Klippel will no longer fill with missed visits and recent substance abuse. Provided #30 pills, which is decreased from previous rx of #60 a month, as patient showing possible withdrawal symptoms. Told patient I would continue to taper and would not provide early refill; will need to go to ED if has further withdrawal symptoms. He expressed understanding.   Follow-up in about 2 weeks due to change in pain regimen.  Olene Floss, MD [place], PGY-3

## 2017-04-11 NOTE — Patient Instructions (Addendum)
Mr. Blank,  Please take doxycyline 100 mg twice daily for the next 10 days to address increased drainage of your wound.  Because of recent substance use, I will start to taper down your pain medication. I would recommend taking no more than 12 tablets the next week of refill, 11 tablets the next week, 10 tablets the next week, and 9 tablets the next week. My plan will be to continue to decrease the amount of pain pills by 10% a week going forward.  I have refilled the diazepam but with only #30 tablets. Please take no more than 1 tablet a day or you will run out. I will not provide refills of this before 30 days and plan to slowly taper this as well.  Please see Dr. Gwenlyn Saran at your earliest convenience.  Best, Dr. Ola Spurr

## 2017-04-11 NOTE — Telephone Encounter (Signed)
Patient called and left a VM.  I called him back and left a VM.  He had previously left a VM that stated he was out of his diazepam.  I had called him back and offered him a 10:00 appointment on the day of mood clinic (9/19) to discuss this.  He said today on his VM that he slept through that.  Will continue to try to work with him.

## 2017-04-11 NOTE — Telephone Encounter (Signed)
Remo Lipps called and left a VM.  I called back and left a Vm.

## 2017-04-13 DIAGNOSIS — F39 Unspecified mood [affective] disorder: Secondary | ICD-10-CM | POA: Insufficient documentation

## 2017-04-13 NOTE — Assessment & Plan Note (Signed)
-   Patient back on his latuda and buspar and not endorsing SI/HI. - Encouraged patient to make appointment with Dr. Gwenlyn Saran. Emphasized that he can coordinate visits with me and [facility] to avoid multiple trips to [facility] and improve adherence to psychiatric medications. - After speaking with Dr. Gwenlyn Saran, agreed to take over valium prescription, as she and Dr. Tammi Klippel will no longer fill with missed visits and recent substance abuse. Provided #30 pills, which is decreased from previous rx of #60 a month, as patient showing possible withdrawal symptoms. Told patient I would continue to taper and would not provide early refill; will need to go to ED if has further withdrawal symptoms. He expressed understanding.

## 2017-04-13 NOTE — Assessment & Plan Note (Addendum)
-   Secondary to chronic abdominal wound. Patient reports increased pain with increased drainage of wound. No erythema of surrounding skin, patient tolerating PO well, and no fever to suggest cellulitis or systemic infection. - Will start tapering patient off opioid pain medication due to recent substance abuse; given average use of 14 pills a week and decreasing by 10% a week will give 12, 11, 10, 9 pills per next 4 weeks. Explained to patient safety reasons for doing this and will need closer follow up with him.  - Prescribed doxycycline 100 mg BID x 10 days for increased pain and drainage - Patient scheduling surgery follow-up to determine long-term plan - Recommended taking gabapentin regularly and tylenol as needed  - Reviewed [facility]'s pain contract, which patient had previously signed  Indication for chronic opioid: chronic painful abdominal wound Medication and dose: oxycodone-acetaminophen 10-325 mg # pills per month: 42 (tapering down by 10% a week) Last UDS date: 03/31/17 Pain contract signed (Y/N): Y Date narcotic database last reviewed (include red flags): 04/11/17; appropriate refill activity but known substance abuse as noted above

## 2017-04-28 ENCOUNTER — Ambulatory Visit: Payer: Medicaid Other | Admitting: Internal Medicine

## 2017-04-29 ENCOUNTER — Telehealth: Payer: Self-pay

## 2017-04-29 NOTE — Telephone Encounter (Signed)
Patient wants you to know that he has a surgery consultation on 05/06/2017.Timothy [name]

## 2017-04-30 NOTE — Telephone Encounter (Signed)
Called to check in on patient after no-show. He said drainage from wound gone. To see surgery next week. Has follow-up with me 10/22 for refills.

## 2017-05-05 ENCOUNTER — Other Ambulatory Visit: Payer: Self-pay | Admitting: Internal Medicine

## 2017-05-05 NOTE — Telephone Encounter (Signed)
Patient calling to request refill of:  Name of Medication(s):  Percocet Last date of OV:  04/11/2017 Pharmacy:  To pick up  Will route refill request to Clinic RN.  Discussed with patient policy to call pharmacy for future refills.  Also, discussed refills may take up to 48 hours to approve or deny.  [place]

## 2017-05-07 MED ORDER — OXYCODONE-ACETAMINOPHEN 10-325 MG PO TABS: 1.0000 | ORAL_TABLET | Freq: Three times a day (TID) | ORAL | 0 refills | Status: DC | PRN

## 2017-05-07 NOTE — Telephone Encounter (Signed)
I will refill patient 10 pills to cover for this week while Dr. Ola Spurr is out. This number of pills should be consistent with what she had him taper down to. He needs an appointment with Dr. Ola Spurr for continued tapering and long term pain management plan. Rx left up front for pick up.   Phill Myron, D.O. 05/07/2017, 2:15 PM PGY-3, [place]

## 2017-05-12 ENCOUNTER — Ambulatory Visit (INDEPENDENT_AMBULATORY_CARE_PROVIDER_SITE_OTHER): Payer: Medicaid Other | Admitting: Internal Medicine

## 2017-05-12 ENCOUNTER — Encounter: Payer: Self-pay | Admitting: *Deleted

## 2017-05-12 VITALS — BP 150/92 | HR 109 | Temp 98.1°F | Ht 71.0 in | Wt 155.0 lb

## 2017-05-12 DIAGNOSIS — G894 Chronic pain syndrome: Secondary | ICD-10-CM

## 2017-05-12 DIAGNOSIS — R0602 Shortness of breath: Secondary | ICD-10-CM

## 2017-05-12 DIAGNOSIS — J441 Chronic obstructive pulmonary disease with (acute) exacerbation: Secondary | ICD-10-CM | POA: Diagnosis present

## 2017-05-12 MED ORDER — AZITHROMYCIN 250 MG PO TABS: ORAL_TABLET | ORAL | 0 refills | Status: DC

## 2017-05-12 MED ORDER — OXYCODONE-ACETAMINOPHEN 10-325 MG PO TABS: 1.0000 | ORAL_TABLET | Freq: Three times a day (TID) | ORAL | 0 refills | Status: DC | PRN

## 2017-05-12 MED ORDER — ALBUTEROL SULFATE HFA 108 (90 BASE) MCG/ACT IN AERS: 2.0000 | INHALATION_SPRAY | Freq: Four times a day (QID) | RESPIRATORY_TRACT | 1 refills | Status: DC | PRN

## 2017-05-12 MED ORDER — PREDNISONE 50 MG PO TABS: ORAL_TABLET | ORAL | 0 refills | Status: DC

## 2017-05-12 MED ORDER — DIAZEPAM 5 MG PO TABS: 5.0000 mg | ORAL_TABLET | Freq: Two times a day (BID) | ORAL | 0 refills | Status: DC | PRN

## 2017-05-12 MED ORDER — CYCLOBENZAPRINE HCL 10 MG PO TABS: 10.0000 mg | ORAL_TABLET | Freq: Three times a day (TID) | ORAL | 0 refills | Status: DC | PRN

## 2017-05-12 NOTE — Patient Instructions (Signed)
Mr. Dygert,  Take azithromycin and prednisone for next 5 days.  Try to reduce valium use to as needed but no more than once daily. Please make an appointment with Dr. Gwenlyn Saran and Dr. Tammi Klippel at your earliest convenience.  I will continue to reduce your percocet after your cough clears.  Best, Dr. Ola Spurr

## 2017-05-15 NOTE — Progress Notes (Signed)
[name]  Subjective:  Timothy [name] is a 51 y.o. bipolar disorder, HTN, PUD, COPD, tobacco abuse, chronic abdominal pain s/p multiple abdominal surgeries and persistent wound after ventral hernia repair who presents for chronic pain and cough.   #Cough - Has had cold and chills and cough since Friday - Needing to use albuterol, taking 4 puffs at a time - Notes wheezing and productive cough of greenish phlegm - Continues to smoke and not ready to quit - No known sick contacts - Is making his abdominal pain worse ROS: does not think he has had fevers, no hemoptysis  #Chronic abdominal pain after multiple surgeries - No more abdominal drainage after recent course of doxycycline but has increased abdominal pain due to cough - Reports seeing surgeon Dr. Marlou Starks last week. Patient says no plan for further surgery as risk of poor healing very high.  - Received fewer pain pills last month (#42 instead of #60) due to substance abuse but received 10 additional pills last week from Dr. Marlou Starks for increased pain due to cough  Social History  Substance Use Topics  . Smoking status: Current Every Day Smoker    Packs/day: 2.00    Years: 34.00    Types: Cigarettes    Start date: 07/22/1981  . Smokeless tobacco: Former Systems developer    Types: Chew    Quit date: 12/12/2010     Comment: Current 2 PPD smoker - [place]  . Alcohol use No    Objective: Blood pressure (!) 150/92, pulse (!) 109, temperature 98.1 F (36.7 C), temperature source Oral, height 5\' 11"  (1.803 m), weight 155 lb (70.3 kg), SpO2 99 %. Body mass index is 21.62 kg/m. Constitutional: Thin male in NAD HENT: Mild erythema of posterior oropharynx Cardiovascular: RRR, S1, S2, no m/r/g.  Pulmonary/Chest: Diffuse expiratory wheezing. No increased WOB.  Abdominal: Soft. +BS, mildly TTP to right of chronic wound, which is closed and without drainage Neurological: AOx3, no focal deficits. Skin: Skin is warm and  dry. No rash noted.  Psychiatric: Normal mood and affect.  Vitals reviewed  Assessment/Plan: COPD exacerbation ([place]) - With increased sputum production, shortness of breath, and cough, suspect COPD exacerbation. No focal findings on lung exam to suggest pneumonia. - Prescribed z-pak and 5 day course of steroids. - Continue albuterol prn - Continued to encourage smoking cessation  Chronic pain syndrome - Secondary to multiple abdominal surgeries. Pain somewhat worse than usual in setting of cough. However, no abdominal drainage and no plans by surgery in the foreseeable future to re-operate. - Plan to continue decreasing opioid rx given no plans for surgery and recent substance abuse. However, in setting of cough and having already received additional pills from his surgeon, will continue taper at next month's visit, as respiratory symptoms should be improved by then. - Encouraged patient to continue gabapentin QHS and tylenol prn to reduce opioid usage.  - Asked patient to schedule follow-up with Dr. Gwenlyn Saran. He is agreeable to this.   Indication for chronic opioid: chronic abdominal pain Medication and dose: oxycodone-acetaminophen 10-325 mg # pills per month: #42 (plan to decrease to #30 at next visit) Last UDS date: 03/31/17 Pain contract signed (Y/N): Y Date narcotic database last reviewed (include red flags): Yes - additional rx filled 10/17 for #10 pills   HTN: Patient with increased pain due to cough. Also out of valium. Encouraged him to take valium only prn and that would not be providing more than #30 daily. If  BP continues to be elevated at next visit, will increase antihypertensive regimen.  Follow-up in 3 weeks.  Olene Floss, MD [place], PGY-3

## 2017-05-15 NOTE — Assessment & Plan Note (Signed)
-   With increased sputum production, shortness of breath, and cough, suspect COPD exacerbation. No focal findings on lung exam to suggest pneumonia. - Prescribed z-pak and 5 day course of steroids. - Continue albuterol prn - Continued to encourage smoking cessation

## 2017-05-15 NOTE — Assessment & Plan Note (Addendum)
-   Secondary to multiple abdominal surgeries. Pain somewhat worse than usual in setting of cough. However, no abdominal drainage and no plans by surgery in the foreseeable future to re-operate. - Plan to continue decreasing opioid rx given no plans for surgery and recent substance abuse. However, in setting of cough and having already received additional pills from his surgeon, will continue taper at next month's visit, as respiratory symptoms should be improved by then. - Encouraged patient to continue gabapentin QHS and tylenol prn to reduce opioid usage.  - Asked patient to schedule follow-up with Dr. Gwenlyn Saran. He is agreeable to this.   Indication for chronic opioid: chronic abdominal pain Medication and dose: oxycodone-acetaminophen 10-325 mg # pills per month: #42 (plan to decrease to #30 at next visit) Last UDS date: 03/31/17 Pain contract signed (Y/N): Y Date narcotic database last reviewed (include red flags): Yes - additional rx filled 10/17 for #10 pills

## 2017-05-29 ENCOUNTER — Other Ambulatory Visit: Payer: Self-pay | Admitting: Internal Medicine

## 2017-05-29 NOTE — Telephone Encounter (Signed)
Pt runs out of his pain meds next week.  The next appt is Dec 5.  Can he get refills until his next appt/ Please advise

## 2017-05-29 NOTE — Telephone Encounter (Signed)
I have a same day opening next week, 11/16. If he can make that, I would be happy to see him then, as he should not be out of his pain medication until the following week. Please help him schedule. Thank you!

## 2017-05-30 NOTE — Telephone Encounter (Signed)
Pt informed and scheduled for that day. Deseree Kennon Holter, CMA

## 2017-06-06 ENCOUNTER — Ambulatory Visit (INDEPENDENT_AMBULATORY_CARE_PROVIDER_SITE_OTHER): Payer: Medicaid Other | Admitting: Internal Medicine

## 2017-06-06 ENCOUNTER — Encounter: Payer: Self-pay | Admitting: Internal Medicine

## 2017-06-06 ENCOUNTER — Other Ambulatory Visit: Payer: Self-pay

## 2017-06-06 VITALS — BP 122/80 | HR 109 | Temp 98.1°F | Ht 71.0 in | Wt 166.8 lb

## 2017-06-06 DIAGNOSIS — G894 Chronic pain syndrome: Secondary | ICD-10-CM | POA: Diagnosis present

## 2017-06-06 DIAGNOSIS — R062 Wheezing: Secondary | ICD-10-CM

## 2017-06-06 DIAGNOSIS — L989 Disorder of the skin and subcutaneous tissue, unspecified: Secondary | ICD-10-CM | POA: Diagnosis not present

## 2017-06-06 DIAGNOSIS — Z720 Tobacco use: Secondary | ICD-10-CM

## 2017-06-06 DIAGNOSIS — L089 Local infection of the skin and subcutaneous tissue, unspecified: Secondary | ICD-10-CM

## 2017-06-06 DIAGNOSIS — S31109D Unspecified open wound of abdominal wall, unspecified quadrant without penetration into peritoneal cavity, subsequent encounter: Secondary | ICD-10-CM

## 2017-06-06 MED ORDER — OXYCODONE-ACETAMINOPHEN 10-325 MG PO TABS: 1.0000 | ORAL_TABLET | Freq: Three times a day (TID) | ORAL | 0 refills | Status: DC | PRN

## 2017-06-06 MED ORDER — FLUTICASONE PROPIONATE HFA 44 MCG/ACT IN AERO: 1.0000 | INHALATION_SPRAY | Freq: Two times a day (BID) | RESPIRATORY_TRACT | 3 refills | Status: DC

## 2017-06-06 NOTE — Progress Notes (Signed)
[name] Progress Note  Subjective:  Timothy [name] is a 51 y.o. male with history of bipolar disorder, HTN, PUD, COPD, tobacco abuse, chronic abdominal pain s/p multiple abdominal surgeries and persistent wound after ventral hernia repair who presents for chronic pain follow-up. He also complains of cough and moles on his face.  #Chronic pain: - 2/2 multiple abdominal surgeries; no further surgeries planned for future - Receiving reduced amount of percocet a month since experimenting with drugs earlier this year - Denies any current drug use - Wary about continuing taper while continues to have cough and feels like his abdominal wound is about to start draining again ROS: No fevers; is having regular BMs  #Shortness of breath/wheezing: - s/p z-pak and 5 day course of steroids at the end of October - feels like he continues to wheeze more often - albuterol does consistently help - Reports improvement with albuterol inhaler. Has tried spiriva in the past without improvement. - Last had PFTs 08/31/15 had normal lung volumes but with FEV1/FVC ratio of 72-73%. At that time, Dr. Valentina Lucks suggested considering trial of inhaled coricosteroid.  - Still smoking cigarettes and not yet ready to quit  #Mole concerns: - Says larger growth on his right chin has been present since his 83s but has been getting bigger - Only irritates him with shaving and has cut it a few times; affects how he can shave - Also has a smaller growth on his chin that is more recent that he would also like removed  Allergies  Allergen Reactions  . Ambien [Zolpidem Tartrate] Other (See Comments)    Caused agitation of mood.   [name] [Propoxyphene N-Acetaminophen] Hives and Itching  . Morphine And Related Itching  . Bactroban [Mupirocin Calcium] Swelling    Caused infection at a surgical site  . Lisinopril Cough    Social History   Tobacco Use  . Smoking status: Current Every Day Smoker   Packs/day: 2.00    Years: 34.00    Pack years: 68.00    Types: Cigarettes    Start date: 07/22/1981  . Smokeless tobacco: Former Systems developer    Types: Chew    Quit date: 12/12/2010  . Tobacco comment: Current 2 PPD smoker - [place]  Substance Use Topics  . Alcohol use: No    Alcohol/week: 0.0 oz    Objective: Blood pressure 122/80, pulse (!) 109, temperature 98.1 F (36.7 C), temperature source Oral, height 5\' 11"  (1.803 m), weight 166 lb 12.8 oz (75.7 kg), SpO2 97 %. Body mass index is 23.26 kg/m. Constitutional: Thin male, pleasant, in NAD HENT: MMM Cardiovascular: RRR, S1, S2, no m/r/g.  Pulmonary/Chest: Moving air well and no increased WOB but diffuse expiratory wheezes throughout posterior lung fields.  Abdominal: Soft. +BS, NT, chronic abdominal scars without surrounding erythema or drainage Skin: Pearly <1 cm raised lesion on R chin and smaller pearly skin tag at bottom of chin Neurological: AOx3, no focal deficits. Psychiatric: Normal mood and affect.  Vitals reviewed  Assessment/Plan: Chronic pain syndrome - Secondary to multiple abdominal surgeries. Patient concerned wound will start draining again soon based on discomfort - Plan to continue decreasing opioid rx given no plans for surgery and recent substance abuse. However, in setting of increased cough and recurrent skin breakdown (no drainage at present) will refer to wound care and change inhaler regimen first. - Encouraged patient to continue gabapentin QHS and tylenol prn to reduce opioid usage.  - Asked patient to schedule follow-up with  Dr. Gwenlyn Saran. He is agreeable to this.   Indication for chronic opioid: chronic abdominal pain Medication and dose: oxycodone-acetaminophen 10-325 mg # pills per month: #42 (plan to decrease to #30 at next visit) Last UDS date: 03/31/17 Pain contract signed (Y/N): Y Date narcotic database last reviewed (include red flags): 06/06/17; no red flags  Wheezing - Will trial flovent  along with continued use of prn albuterol - Consider repeat PFTs when cough improved  Skin abnormality - Exam suspicious for basal cell carcinoma on R chin.  - Will refer to dermatology for removal and pathology review, given location  Of note, patient not requesting another valium prescription this month, as just received 11/15 due to insurance delay. Recommended patient follow-up with Dr. Gwenlyn Saran and Tammi Klippel for mood.   Follow-up next month for medication refill and to see if breathing has improved.  Olene Floss, MD [place], PGY-3

## 2017-06-06 NOTE — Patient Instructions (Signed)
Mr. Pautsch,  I will refer you to wound care and dermatology.  Please try flovent inhaler twice a day along with albuterol as needed for your wheezing. Once you are feeling better, it would likely be helpful to repeat lung function testing with Dr. Valentina Lucks.  Please see me back in 1 month.  Best, Dr. Ola Spurr

## 2017-06-08 ENCOUNTER — Encounter: Payer: Self-pay | Admitting: Internal Medicine

## 2017-06-08 DIAGNOSIS — R062 Wheezing: Secondary | ICD-10-CM | POA: Insufficient documentation

## 2017-06-08 NOTE — Assessment & Plan Note (Addendum)
-   Secondary to multiple abdominal surgeries. Patient concerned wound will start draining again soon based on discomfort - Plan to continue decreasing opioid rx given no plans for surgery and recent substance abuse. However, in setting of increased cough and recurrent skin breakdown (no drainage at present) will refer to wound care and change inhaler regimen first. - Encouraged patient to continue gabapentin QHS and tylenol prn to reduce opioid usage.  - Asked patient to schedule follow-up with Dr. Gwenlyn Saran. He is agreeable to this.   Indication for chronic opioid: chronic abdominal pain Medication and dose: oxycodone-acetaminophen 10-325 mg # pills per month: #42 (plan to decrease to #30 at next visit) Last UDS date: 03/31/17 Pain contract signed (Y/N): Y Date narcotic database last reviewed (include red flags): 06/06/17; no red flags

## 2017-06-08 NOTE — Assessment & Plan Note (Signed)
-   Will trial flovent along with continued use of prn albuterol - Consider repeat PFTs when cough improved

## 2017-06-08 NOTE — Assessment & Plan Note (Signed)
-   Exam suspicious for basal cell carcinoma on R chin.  - Will refer to dermatology for removal and pathology review, given location

## 2017-07-04 ENCOUNTER — Telehealth: Payer: Self-pay | Admitting: Internal Medicine

## 2017-07-04 ENCOUNTER — Ambulatory Visit (INDEPENDENT_AMBULATORY_CARE_PROVIDER_SITE_OTHER): Payer: Medicaid Other | Admitting: Internal Medicine

## 2017-07-04 VITALS — BP 124/80 | HR 105 | Temp 98.1°F | Ht 71.0 in | Wt 167.8 lb

## 2017-07-04 DIAGNOSIS — F39 Unspecified mood [affective] disorder: Secondary | ICD-10-CM

## 2017-07-04 DIAGNOSIS — F3162 Bipolar disorder, current episode mixed, moderate: Secondary | ICD-10-CM | POA: Diagnosis not present

## 2017-07-04 DIAGNOSIS — J449 Chronic obstructive pulmonary disease, unspecified: Secondary | ICD-10-CM

## 2017-07-04 DIAGNOSIS — G894 Chronic pain syndrome: Secondary | ICD-10-CM

## 2017-07-04 MED ORDER — BUSPIRONE HCL 15 MG PO TABS: 15.0000 mg | ORAL_TABLET | Freq: Every day | ORAL | 11 refills | Status: DC

## 2017-07-04 MED ORDER — LIDOCAINE 5 % EX PTCH: 1.0000 | MEDICATED_PATCH | CUTANEOUS | 0 refills | Status: DC

## 2017-07-04 MED ORDER — OXYCODONE-ACETAMINOPHEN 10-325 MG PO TABS: 1.0000 | ORAL_TABLET | Freq: Three times a day (TID) | ORAL | 0 refills | Status: DC | PRN

## 2017-07-04 MED ORDER — CYCLOBENZAPRINE HCL 10 MG PO TABS: 10.0000 mg | ORAL_TABLET | Freq: Three times a day (TID) | ORAL | 0 refills | Status: DC | PRN

## 2017-07-04 MED ORDER — DIAZEPAM 5 MG PO TABS: 5.0000 mg | ORAL_TABLET | Freq: Two times a day (BID) | ORAL | 0 refills | Status: DC | PRN

## 2017-07-04 NOTE — Telephone Encounter (Signed)
Will forward to MD to make her aware.  Jerod Mcquain,CMA  

## 2017-07-04 NOTE — Patient Instructions (Addendum)
Mr. Seehafer,  For sleep, please restart buspar nightly.  For wheezing, take flovent 1 puff twice daily and continue albuterol as needed.  I have refilled valium -- take just as needed.  I have refilled percocet. Space these out as best you can and cut pills in half.   I have prescribed a lidocaine patch. I may need to approve this for your insurance. This you would place on the right side of your abdomen daily and remove after 12 hours. So you could place before bed and then remove in the morning, for example.  Please make an appointment with Dr. Gwenlyn Saran.   Best, Dr. Ola Spurr

## 2017-07-04 NOTE — Progress Notes (Signed)
[name]  Subjective:  Timothy [name] is a 51 y.o. with history of bipolar disorder, HTN, PUD, COPD, tobacco abuse, chronic abdominal pain s/p multiple abdominal surgeries and persistent wound after ventral hernia repair who presents for chronic painfollow-up.   #Chronic pain: - cough improved, which has helped abdominal pain - did not see wound care for breakdown of skin at hernia repair site because could not be seen until February and has had no further drainage - taking percocet 10-325 up to twice a day; weaning down with supervision due to substance abuse earlier this year - also taking valium -- previously prescribed by mood clinic. Taking prn and not twice daily as before. ROS: No emesis  #COPD: - notes improvement in wheezing with using flovent but taking only prn - not willing to quit tobacco at this time ROS: No fever  Since last visit, patient has had moles removed by Dermatology.   Patient notes he has had difficulty sleeping recently. He reports he has been out of his buspar for a month because insurance needs me (PCP) to prescribe this. He has, however, been able to get his latuda. No SI/HI but found out his longterm girlfriend cheated on him, which is stressful.   Allergies  Allergen Reactions  . Ambien [Zolpidem Tartrate] Other (See Comments)    Caused agitation of mood.   [name] [Propoxyphene N-Acetaminophen] Hives and Itching  . Morphine And Related Itching  . Bactroban [Mupirocin Calcium] Swelling    Caused infection at a surgical site  . Lisinopril Cough    Social History   Tobacco Use  . Smoking status: Current Every Day Smoker    Packs/day: 2.00    Years: 34.00    Pack years: 68.00    Types: Cigarettes    Start date: 07/22/1981  . Smokeless tobacco: Former Systems developer    Types: Chew    Quit date: 12/12/2010  . Tobacco comment: Current 2 PPD smoker - [place]  Substance Use Topics  . Alcohol use: No    Alcohol/week:  0.0 oz    Objective: Blood pressure 124/80, pulse (!) 105, temperature 98.1 F (36.7 C), temperature source Oral, weight 167 lb 12.8 oz (76.1 kg), SpO2 99 %. Body mass index is 23.4 kg/m. Constitutional: Well-appearing male in NAD Cardiovascular: Slightly tachycardic, S1, S2, no m/r/g.  Pulmonary/Chest: Mild expiratory wheezing over posterior lung fields.  Abdominal: Soft but with scar tissue surrounding umbilicus, +BS, mildly tender over RLQ (chronic)  Psychiatric: Normal mood and affect.  Vitals reviewed  Assessment/Plan: Chronic pain syndrome - Stable/improved since last visit with cough that is better controlled and no abdominal drainage. Secondary to multiple abdominal surgeries.  - Plan to continue decreasing opioid rx given no plans for surgery and recent substance abuse. Decreasing from #42 to #30 today.  - Encouraged patient to continue gabapentin QHS and tylenol prn to reduce opioid usage.  - Will also have patient try lidocaine patch since so much of his pain is focal to RLQ.  - Asked patient to schedule follow-up with Dr. Gwenlyn Saran. He is agreeable to this.  Indication for chronic opioid:chronic abdominal pain Medication and dose:oxycodone-acetaminophen 10-325 mg # pills per month:#30 (plan to decrease to #25 at next visit) Last UDS date:03/31/17 Pain contract signed (Y/N):Y Date narcotic database last reviewed (include red flags):07/03/17; no red flags  Chronic obstructive pulmonary disease ([place]) - Cough improved on flovent. Recommended taking as prescribed -- one puff BID. Explained reasoning of maintenance therapy. To  take albuterol prn.  - Encouraged cutting back on cigarettes.  Bipolar disorder - Refilled buspar for patient as this is long-term medication and patient experiencing difficulty sleeping. - Encouraged patient to make appointment with [facility] no later than next month, as his prescription for latuda will expire in February. Patient says he plans to  coordinate his visit with me next month with a visit with Dr. Gwenlyn Saran and Dr. Tammi Klippel.   Follow-up next month for pain management.  Olene Floss, MD [place], PGY-3

## 2017-07-04 NOTE — Telephone Encounter (Signed)
His sleep medicine is amitriptyline. 50mg .

## 2017-07-07 ENCOUNTER — Encounter: Payer: Self-pay | Admitting: Internal Medicine

## 2017-07-07 DIAGNOSIS — J449 Chronic obstructive pulmonary disease, unspecified: Secondary | ICD-10-CM | POA: Insufficient documentation

## 2017-07-07 NOTE — Assessment & Plan Note (Addendum)
-   Stable/improved since last visit with cough that is better controlled and no abdominal drainage. Secondary to multiple abdominal surgeries.  - Plan to continue decreasing opioid rx given no plans for surgery and recent substance abuse. Decreasing from #42 to #30 today.  - Encouraged patient to continue gabapentin QHS and tylenol prn to reduce opioid usage.  - Will also have patient try lidocaine patch since so much of his pain is focal to RLQ.  - Asked patient to schedule follow-up with Dr. Gwenlyn Saran. He is agreeable to this.  Indication for chronic opioid:chronic abdominal pain Medication and dose:oxycodone-acetaminophen 10-325 mg # pills per month:#30 (plan to decrease to #25 at next visit) Last UDS date:03/31/17 Pain contract signed (Y/N):Y Date narcotic database last reviewed (include red flags):07/03/17; no red flags

## 2017-07-07 NOTE — Assessment & Plan Note (Addendum)
-   Cough improved on flovent. Recommended taking as prescribed -- one puff BID. Explained reasoning of maintenance therapy. To take albuterol prn.  - Encouraged cutting back on cigarettes.

## 2017-07-07 NOTE — Assessment & Plan Note (Signed)
-   Refilled buspar for patient as this is long-term medication and patient experiencing difficulty sleeping. - Encouraged patient to make appointment with [facility] no later than next month, as his prescription for latuda will expire in February. Patient says he plans to coordinate his visit with me next month with a visit with Dr. Gwenlyn Saran and Dr. Tammi Klippel.

## 2017-07-31 ENCOUNTER — Other Ambulatory Visit: Payer: Self-pay | Admitting: Internal Medicine

## 2017-08-01 ENCOUNTER — Ambulatory Visit (INDEPENDENT_AMBULATORY_CARE_PROVIDER_SITE_OTHER): Payer: Medicaid Other | Admitting: Internal Medicine

## 2017-08-01 DIAGNOSIS — G894 Chronic pain syndrome: Secondary | ICD-10-CM | POA: Diagnosis not present

## 2017-08-01 MED ORDER — OXYCODONE-ACETAMINOPHEN 10-325 MG PO TABS: 1.0000 | ORAL_TABLET | Freq: Three times a day (TID) | ORAL | 0 refills | Status: DC | PRN

## 2017-08-01 NOTE — Patient Instructions (Signed)
Mr. Schlender,  Use the valium just as needed for panic symptoms or overwhelming anxiety.   Please see me back next month before you need more refills of percocet.  I will call your pharmacy to try to get the lidocaine patch approved.  Best, Dr. Ola Spurr

## 2017-08-02 ENCOUNTER — Encounter: Payer: Self-pay | Admitting: Internal Medicine

## 2017-08-02 MED ORDER — LOSARTAN POTASSIUM 100 MG PO TABS: 100.0000 mg | ORAL_TABLET | Freq: Every day | ORAL | 3 refills | Status: DC

## 2017-08-02 NOTE — Assessment & Plan Note (Signed)
-   Stable/improved. Secondary to multiple abdominal surgeries. - Plan to continue decreasing opioid rx given no plans for surgery and substance abuse last fall. Decreasing from #30 to #27 today. Continue to taper with goal of being off opioid pain medication by end of this year. - Encouraged patient to continue tylenol prn to reduce opioid usage.  - Will request PA for lidocaine patch, as much of pain seems superficial over scar and focal to RLQ.  - Asked patient to schedule follow-up with Dr. Gwenlyn Saran. He is agreeable to this.

## 2017-08-02 NOTE — Progress Notes (Signed)
[name] Progress Note  Subjective:  Timothy [name] is a 52 y.o. male with history ofbipolar disorder, HTN, PUD, COPD, tobacco abuse, chronic abdominal pain s/p multiple abdominal surgeries and persistent wound after ventral hernia repair who presents for chronic painfollow-up.   #Chronic pain: - Abdominal pain somewhat improved with less coughing with better control of COPD with regular use of flovent.  - Has been breaking percocet 10's in half and using BID - Has been able to be more active around the house.  - No plans for further surgery - No recent abdominal drainage - No fevers - Having regular BMs. No blood in stools. - Was not able to get lidocaine patch for RLQ pain over previous incision, which is where he feels majority of his pain. Says was told needs PA.  - Not regularly using gabapentin  Of note, patient has been using valium for anxiety. Previously prescribed by his behavioral health providers but last rx given by me at reduced number of pills given concern for recent substance abuse. He had been out of medication last month and coped alright. Picked up new prescription a couple days ago. No SI/HI.   Allergies  Allergen Reactions  . Ambien [Zolpidem Tartrate] Other (See Comments)    Caused agitation of mood.   [name] [Propoxyphene N-Acetaminophen] Hives and Itching  . Morphine And Related Itching  . Bactroban [Mupirocin Calcium] Swelling    Caused infection at a surgical site  . Lisinopril Cough    Social History   Tobacco Use  . Smoking status: Current Every Day Smoker    Packs/day: 2.00    Years: 34.00    Pack years: 68.00    Types: Cigarettes    Start date: 07/22/1981  . Smokeless tobacco: Former Systems developer    Types: Chew    Quit date: 12/12/2010  . Tobacco comment: Current 2 PPD smoker - [place]  Substance Use Topics  . Alcohol use: No    Alcohol/week: 0.0 oz    Objective: Blood pressure 110/80, pulse 90, temperature 98.5 F  (36.9 C), temperature source Oral, height 5\' 11"  (1.803 m), weight 167 lb 9.6 oz (76 kg), SpO2 99 %. Body mass index is 23.38 kg/m. Constitutional: Well-appearing male in NAD Cardiovascular: RRR, S1, S2, no m/r/g.  Pulmonary/Chest: Few expiratory wheezes over posterior lung fields. No increased work of breathing.  Abdominal: Soft. +BS, mildly tender over RLQ below umbilicus over site of abdominal scar Neurological: AOx3, no focal deficits. Skin: Skin is warm and dry. No rash noted.  Psychiatric: Normal mood and affect.  Vitals reviewed  Assessment/Plan: Chronic pain syndrome - Stable/improved. Secondary to multiple abdominal surgeries. - Plan to continue decreasing opioid rx given no plans for surgery and substance abuse last fall. Decreasing from #30 to #27 today. Continue to taper with goal of being off opioid pain medication by end of this year. - Encouraged patient to continue tylenol prn to reduce opioid usage.  - Will request PA for lidocaine patch, as much of pain seems superficial over scar and focal to RLQ.  - Asked patient to schedule follow-up with Dr. Gwenlyn Saran. He is agreeable to this.   Regarding valium, will need to continue to taper this. Counseled patient extensively on appropriate reasons to take this rather than regularly given risk of concomitent use with opioids. No Rx given today.   Follow-up in 1 month for refills.  Olene Floss, MD [place], PGY-3

## 2017-08-04 ENCOUNTER — Telehealth: Payer: Self-pay | Admitting: Internal Medicine

## 2017-08-04 NOTE — Telephone Encounter (Signed)
Called [place] [facility] and got PA number [id number]. Called pharmacy, and they will fill medication.  Olene Floss, MD [name], PGY-3

## 2017-09-01 ENCOUNTER — Ambulatory Visit (INDEPENDENT_AMBULATORY_CARE_PROVIDER_SITE_OTHER): Payer: Medicaid Other | Admitting: Internal Medicine

## 2017-09-01 ENCOUNTER — Encounter: Payer: Self-pay | Admitting: Internal Medicine

## 2017-09-01 VITALS — BP 92/58 | HR 95 | Temp 97.7°F | Ht 71.0 in | Wt 166.0 lb

## 2017-09-01 DIAGNOSIS — R0602 Shortness of breath: Secondary | ICD-10-CM

## 2017-09-01 DIAGNOSIS — J441 Chronic obstructive pulmonary disease with (acute) exacerbation: Secondary | ICD-10-CM | POA: Diagnosis not present

## 2017-09-01 DIAGNOSIS — J449 Chronic obstructive pulmonary disease, unspecified: Secondary | ICD-10-CM | POA: Diagnosis not present

## 2017-09-01 DIAGNOSIS — G894 Chronic pain syndrome: Secondary | ICD-10-CM

## 2017-09-01 MED ORDER — OXYCODONE-ACETAMINOPHEN 10-325 MG PO TABS: 1.0000 | ORAL_TABLET | Freq: Three times a day (TID) | ORAL | 0 refills | Status: DC | PRN

## 2017-09-01 MED ORDER — DIAZEPAM 5 MG PO TABS: 5.0000 mg | ORAL_TABLET | Freq: Every day | ORAL | 0 refills | Status: DC | PRN

## 2017-09-01 MED ORDER — ALBUTEROL SULFATE HFA 108 (90 BASE) MCG/ACT IN AERS: 2.0000 | INHALATION_SPRAY | Freq: Four times a day (QID) | RESPIRATORY_TRACT | 1 refills | Status: DC | PRN

## 2017-09-01 MED ORDER — LIDOCAINE 5 % EX PTCH: 1.0000 | MEDICATED_PATCH | CUTANEOUS | 0 refills | Status: DC

## 2017-09-01 MED ORDER — CYCLOBENZAPRINE HCL 10 MG PO TABS: 10.0000 mg | ORAL_TABLET | Freq: Every day | ORAL | 0 refills | Status: DC | PRN

## 2017-09-01 NOTE — Patient Instructions (Addendum)
Timothy [name],  Please continue to take percocet only as needed as we continue to work on weaning down. Apply a lidocaine patch to right side of abdomen daily to see if this helps decrease amount of pain pills needed.  Take albuterol inhaler (rescue inhaler) every 4 hours as needed for shortness of breath or wheezing. Your daily inhaler is the flovent to take 1 puff twice daily (maintenance inhaler).  Please see me back sooner than 1 month if you have drainage.  Please see Dr. Gwenlyn Saran to check in about ongoing management plans.  Best, Dr. Ola Spurr

## 2017-09-01 NOTE — Progress Notes (Signed)
[name] Progress [facility]  Subjective:  Timothy [name] is a 52 y.o. male with history of chronic abdominal pain after multiple abdominal surgeries, bipolar disorder, HTN, PUD, COPD, and tobacco abuse who presents for chronic painfollow-up.  Patient reports having run out of pain medication over the last few days. He did not call for more due to knowing he had this appointment. He has been trying to take half a pill at a time but needed to take 2 pills one day last week due to coughing spells that made his abdominal pain worse. Thinks this was triggered by warm weather, as symptoms occurred on a hot day. He was using his flovent inhaler without much improvement; says he needs refill of albuterol. Pain still largely concentrated in right lower quandrant. Thinks he was starting to have skin breakdown in this area last week but seems to be improving and no drainage. Did not pick up lidocaine patch last month because was not aware insurance had approved it. Takes rare flexeril for "charlie horse" sensation he gets in abdomen. Not regularly taking gabapentin, as does not feel it helps. ROS: No fevers, no hematochezia or dark stools, having regular BMs   Allergies  Allergen Reactions  . Ambien [Zolpidem Tartrate] Other (See Comments)    Caused agitation of mood.   [name] [Propoxyphene N-Acetaminophen] Hives and Itching  . Morphine And Related Itching  . Bactroban [Mupirocin Calcium] Swelling    Caused infection at a surgical site  . Lisinopril Cough    Social History   Tobacco Use  . Smoking status: Current Every Day Smoker    Packs/day: 2.00    Years: 34.00    Pack years: 68.00    Types: Cigarettes    Start date: 07/22/1981  . Smokeless tobacco: Former Systems developer    Types: Chew    Quit date: 12/12/2010  . Tobacco comment: Current 2 PPD smoker - [place]  Substance Use Topics  . Alcohol use: No    Alcohol/week: 0.0 oz    Objective: Blood pressure (!) 92/58, pulse 95,  temperature 97.7 F (36.5 C), temperature source Oral, height 5\' 11"  (1.803 m), weight 166 lb (75.3 kg), SpO2 99 %. Constitutional: Well-appearing male in NAD, pleasant HENT: MMM, normal posterior oropharynx Cardiovascular: RRR, S1, S2, no m/r/g.  Pulmonary/Chest: Good air movement but diffuse expiratory wheezing.  Abdominal: Soft. +BS, mildly TTP over RLQ Musculoskeletal: No LE edema Skin: Skin is warm and dry. Small patch of dry skin without drainage over midline abdominal scar. Psychiatric: Normal mood and affect.  Vitals reviewed  Assessment/Plan: Chronic pain syndrome -Stable. Secondary to multiple abdominal surgeries. - Plan to continue decreasing opioid rx given no plans for surgery and substance abuse last fall.Decreasing from #27 to #25 today.  Down from #75 last year. Planned to decrease more today but patient with recent pain flare due to cough and ran out last month.  - Encouraged patient to try lidocaine patch and continue tylenol prn to reduce opioid usage. - Will plan to check UDS in 1-2 months. If positive for non-prescribed medications will need to hasten taper, as patient also on benzodiazepine (also tapering).  - Asked patient to schedule follow-up with Dr. Gwenlyn Saran, as he is overdue to check-in with her for his bipolar disorder and would benefit from ongoing counseling for coping mechanisms with stress. He is agreeable to this.  Chronic obstructive pulmonary disease (HCC) - No increased mucus production or shortness of breath to suggest exacerbation. - Refilled albuterol  inhaler.  - Reviewed need to take flovent BID but to use albuterol for wheezing  Follow-up next month.  Olene Floss, MD [place], PGY-3

## 2017-09-06 ENCOUNTER — Encounter: Payer: Self-pay | Admitting: Internal Medicine

## 2017-09-06 NOTE — Assessment & Plan Note (Addendum)
-  Stable. Secondary to multiple abdominal surgeries. - Plan to continue decreasing opioid rx given no plans for surgery and substance abuse last fall.Decreasing from #27 to #25 today. Down from #75 last year. Planned to decrease more today but patient with recent pain flare due to cough and ran out last month.  - Encouraged patient to try lidocaine patch and continue tylenol prn to reduce opioid usage. - Will plan to check UDS in 1-2 months. If positive for non-prescribed medications will need to hasten taper, as patient also on benzodiazepine (also tapering).  - Asked patient to schedule follow-up with Dr. Gwenlyn Saran, as he is overdue to check-in with her for his bipolar disorder and would benefit from ongoing counseling for coping mechanisms with stress. He is agreeable to this.  Indication for chronic opioid:chronic abdominal pain Medication and dose:oxycodone-acetaminophen 10-325 mg # pills per month:#25 (tapering down from 75 last year) Last UDS date:03/31/17 Pain contract signed (Y/N):Y Date narcotic database last reviewed (include red flags):09/01/17; no red flags

## 2017-09-06 NOTE — Assessment & Plan Note (Signed)
-   No increased mucus production or shortness of breath to suggest exacerbation. - Refilled albuterol inhaler.  - Reviewed need to take flovent BID but to use albuterol for wheezing

## 2017-10-01 ENCOUNTER — Ambulatory Visit (INDEPENDENT_AMBULATORY_CARE_PROVIDER_SITE_OTHER): Payer: Medicaid Other | Admitting: Internal Medicine

## 2017-10-01 ENCOUNTER — Telehealth: Payer: Self-pay | Admitting: Internal Medicine

## 2017-10-01 VITALS — BP 121/80 | HR 102 | Temp 98.0°F | Wt 164.0 lb

## 2017-10-01 DIAGNOSIS — R053 Chronic cough: Secondary | ICD-10-CM

## 2017-10-01 DIAGNOSIS — R05 Cough: Secondary | ICD-10-CM | POA: Diagnosis not present

## 2017-10-01 DIAGNOSIS — F410 Panic disorder [episodic paroxysmal anxiety] without agoraphobia: Secondary | ICD-10-CM | POA: Diagnosis not present

## 2017-10-01 DIAGNOSIS — G894 Chronic pain syndrome: Secondary | ICD-10-CM | POA: Diagnosis not present

## 2017-10-01 DIAGNOSIS — R0602 Shortness of breath: Secondary | ICD-10-CM | POA: Diagnosis not present

## 2017-10-01 DIAGNOSIS — Z720 Tobacco use: Secondary | ICD-10-CM | POA: Diagnosis not present

## 2017-10-01 DIAGNOSIS — J441 Chronic obstructive pulmonary disease with (acute) exacerbation: Secondary | ICD-10-CM | POA: Diagnosis not present

## 2017-10-01 DIAGNOSIS — R062 Wheezing: Secondary | ICD-10-CM

## 2017-10-01 MED ORDER — BUSPIRONE HCL 15 MG PO TABS: 15.0000 mg | ORAL_TABLET | Freq: Every day | ORAL | 5 refills | Status: DC

## 2017-10-01 MED ORDER — OXYCODONE-ACETAMINOPHEN 10-325 MG PO TABS: 1.0000 | ORAL_TABLET | Freq: Three times a day (TID) | ORAL | 0 refills | Status: DC | PRN

## 2017-10-01 MED ORDER — LURASIDONE HCL 40 MG PO TABS: 40.0000 mg | ORAL_TABLET | Freq: Every day | ORAL | 5 refills | Status: DC

## 2017-10-01 MED ORDER — CYCLOBENZAPRINE HCL 10 MG PO TABS: 10.0000 mg | ORAL_TABLET | Freq: Every day | ORAL | 0 refills | Status: DC | PRN

## 2017-10-01 MED ORDER — PREDNISONE 20 MG PO TABS: 40.0000 mg | ORAL_TABLET | Freq: Every day | ORAL | 0 refills | Status: DC

## 2017-10-01 MED ORDER — DIAZEPAM 5 MG PO TABS: 5.0000 mg | ORAL_TABLET | Freq: Every day | ORAL | 0 refills | Status: DC | PRN

## 2017-10-01 MED ORDER — ALBUTEROL SULFATE HFA 108 (90 BASE) MCG/ACT IN AERS: 2.0000 | INHALATION_SPRAY | Freq: Four times a day (QID) | RESPIRATORY_TRACT | 1 refills | Status: DC | PRN

## 2017-10-01 NOTE — Patient Instructions (Signed)
Mr. Dolley,  Thank you for coming in.  Please see me back in 1 month for refills.  Try prednisone after your appointment with Dr. Valentina Lucks if you are still having regular wheezing. He may change your inhalers at that appointment.  Best, Dr. Ola Spurr

## 2017-10-01 NOTE — Progress Notes (Signed)
[name] Progress Note  Subjective:  Timothy [name] is a 52 y.o. male with history of chronic abdominal pain after multiple abdominal surgeries, bipolar disorder, anxiety, tobacco abuse, and COPD who presents for chronic pain management and ongoing cough.   #Chronic pain: - Has been doing well with taper of oxycodone-acetaminophen. Says he usually breaks pills in half and takes 1 pill or max 1.5 pills a day - However, feels like continued coughing is making pain worse - Tried lidocaine patch but feels like pain stayed the same and belly just felt "weird" - Continues to have charlie horse sensation for which he takes flexeril, which works - Reports gabapentin made him too sleepy - Thinks taking protonix helps. Still having coffee and fizzy drinks.  ROS: No constipation, no dark stools  #Continued cough: - Has had ongoing dry cough for a few months.  - Not using flovent consistently as does not feels it helps like the albuterol - Using albuterol 1-2 times a day; took twice this a.m. - Denies fevers or feeling short of breath  Social: Smoking about 2 packs a day still. Says helps with stress.   Medication refills: Needs latuda and buspar refills. Ran out of diazepam about 3-4 days ago; has been taking only once daily.    Allergies  Allergen Reactions  . Ambien [Zolpidem Tartrate] Other (See Comments)    Caused agitation of mood.   [name] [Propoxyphene N-Acetaminophen] Hives and Itching  . Morphine And Related Itching  . Bactroban [Mupirocin Calcium] Swelling    Caused infection at a surgical site  . Lisinopril Cough    Social History   Tobacco Use  . Smoking status: Current Every Day Smoker    Packs/day: 2.00    Years: 34.00    Pack years: 68.00    Types: Cigarettes    Start date: 07/22/1981  . Smokeless tobacco: Former Systems developer    Types: Chew    Quit date: 12/12/2010  . Tobacco comment: Current 2 PPD smoker - [place] [employer]  Substance Use Topics  .  Alcohol use: No    Alcohol/week: 0.0 oz    Objective: Blood pressure 121/80, pulse (!) 102, temperature 98 F (36.7 C), weight 164 lb (74.4 kg), SpO2 97 %. Body mass index is 22.87 kg/m. Constitutional: Thin male in NAD, pleasant Cardiovascular: Slightly tachycardic, S1, S2, no m/r/g.  Pulmonary/Chest: Effort normal. Expiratory wheezing throughout.  Abdominal: Soft. +BS, mildly tender over right mid/lower quadrant but no rebound out guarding Skin: Abdominal wall scars present Vitals reviewed  Assessment/Plan: Chronic pain syndrome - Stable. Secondary to multiple abdominal surgeries. - Plan to continue decreasing opioid rx given no plans for surgery and substance abuselast fall.Will keep at #25today due to exacerbating factor of uncontrolled cough. Down from #75 last year.  - Will plan to check UDS in 1-2 months. Discussed with patient. If positive for non-prescribed medications will need to hasten taper, as patient also on benzodiazepine (also working to taper).  - Continued to recommend following up with Dr. Gwenlyn Saran to discuss mood and work on coping strategies   Indication for chronic opioid:chronic abdominal pain Medication and dose:oxycodone-acetaminophen 10-325 mg # pills per month:#25(tapering down from 75 last year) Last UDS date:03/31/17 Pain contract signed (Y/N):Y Date narcotic database last reviewed (include red flags):09/30/17; no red flags  Panic anxiety syndrome - Refilled diazepam. Reviewed importance of not drinking with this medication in combination with opioid use  Tobacco abuse - Not ready to quit at this time  Wheezing - No focal findings to suggest pneumonia, but symptoms ongoing for 2-3 months so will obtain chest xray. Suspect chronic bronchitis from heavy tobacco abuse. Do not suspect COPD exacerbation due to lack of increased sputum production and cough has been waxing and waning.  - Gave prescription for short course of PO prednisone in case  wheezing gets worse.   - Patient is to follow-up with Dr. Valentina Lucks tomorrow, 3/14, for repeat lung function testing and suggestions on different maintenance inhaler to try  Follow-up within 1 month to continue taper.  Olene Floss, MD [place], PGY-3

## 2017-10-01 NOTE — Telephone Encounter (Signed)
Needs prior authorization on oxy.  [facility] at [address]

## 2017-10-01 NOTE — Telephone Encounter (Signed)
Completed PA and placed in fax pile.   Olene Floss, MD [place], PGY-3

## 2017-10-02 ENCOUNTER — Encounter: Payer: Self-pay | Admitting: Pharmacist

## 2017-10-02 ENCOUNTER — Ambulatory Visit (INDEPENDENT_AMBULATORY_CARE_PROVIDER_SITE_OTHER): Payer: Medicaid Other | Admitting: Pharmacist

## 2017-10-02 DIAGNOSIS — J449 Chronic obstructive pulmonary disease, unspecified: Secondary | ICD-10-CM

## 2017-10-02 DIAGNOSIS — Z720 Tobacco use: Secondary | ICD-10-CM | POA: Diagnosis not present

## 2017-10-02 MED ORDER — FLUTICASONE-UMECLIDIN-VILANT 100-62.5-25 MCG/INH IN AEPB: 1.0000 | INHALATION_SPRAY | Freq: Every day | RESPIRATORY_TRACT | 0 refills | Status: DC

## 2017-10-02 MED ORDER — FLUTICASONE-UMECLIDIN-VILANT 100-62.5-25 MCG/INH IN AEPB: 1.0000 | INHALATION_SPRAY | Freq: Every day | RESPIRATORY_TRACT | 12 refills | Status: DC

## 2017-10-02 NOTE — Telephone Encounter (Signed)
Completed PA info in [place] for oxycodone-acetaminophen.  Status pending.  Will recheck status in 24 hours. Wallace Cullens, RN

## 2017-10-02 NOTE — Assessment & Plan Note (Addendum)
Spirometry evaluation reveals "moderate" obstructive lung disease on spirometry with significant worsening since last evaluation in 2017.  Reversibility of  8.4% change in FVC and 10.6% change in FEV1 is similar to past spirometry reversal with albuterol  Plan to treat him as asthma/COPD overall syndrome. CAT score of 32 and Lung age of 9 are significantly worsened since last evaluation.  S/sx likely attributed to asthma/COPD overlap syndrome due to long hx of smoking and slight reversibility . Patient has been experiencing shortness of breath, wheezing, and coughing since his last doctor's office visit and is currently taking albuterol. Begin Trelegy (fluticasone furoate, umeclidinium, and vilanterol inhalation powder) Ellipta inhaler (LABA/LAMA/ICS) one inhalation daily treatment plan at this time.  Educated patient on purpose, proper use, potential adverse effects including increased risk of esophageal candidiasis and need to rinse mouth after each use. Avoided LAMA treatment alone and ICS treatment alone due to treatment failure (patient reported Spiriva and Flovent "did not work for him").   Reviewed results of pulmonary function tests.

## 2017-10-02 NOTE — Patient Instructions (Addendum)
It was nice to see you again today!   1. You received some samples of a new inhaler, Trelegy, today. Take this medication once a day. Remember to RINSE your mouth with water and SPIT into the sink after each use of this medication to avoid thrush in your mouth.  2. A prescription was sent to your pharmacy for the Trelegy for you to pick up when you run out of samples.  3. Continue to use your albuterol inhaler as you feel that you need it, but as you use the Trelegy you might need to use the albuterol less than you have been.  4. Continue to think about smoking cessation!

## 2017-10-02 NOTE — Assessment & Plan Note (Signed)
Chronic severe tobacco abuse.  Currently 2 ppd and uninterested in quit attempt in the near future.  Discussed reducing from  2 ppd to 1.5ppd. Reevaluate at next visit.

## 2017-10-02 NOTE — Progress Notes (Signed)
   S:    Patient arrives in good spirits, short of breath, ambulating without assistance.    Presents for lung function evaluation.   Patient was referred on 10/01/2017.  Patient was last seen by Primary Care Provider on 10/01/2017.  Patient reports breathing has been difficult with regular wheezing and coughing. Patient endorses he smokes 2 packs/day. He has tried smoking cessation in the past, but reports that stress "gets to him" and he feels he can't overcome that feeling. Patient reports he has not taken Flovent in several months, uses albuterol frequently (3 puffs Q6H daily). Patient does report that he wakes up overnight with feeling of breathlessness and also wakes overnight to smoke.   Patient reports last dose of COPD medications was 3am took albuterol, is not taking Flovent or Spiriva.   O: mMRC score= greater than 2  CAT score= 32 See Documentation Flowsheet - CAT/COPD for complete symptom scoring.  See "scanned report" or Documentation Flowsheet (discrete results - PFTs) for  Spirometry results. Patient provided good effort while attempting spirometry.   Lung Age = 20 Albuterol Neb  Lot# [id number]     Exp. 01/2019  A/P: Spirometry evaluation reveals "moderate" obstructive lung disease on spirometry with significant worsening since last evaluation in 2017.  Reversibility of  8.4% change in FVC and 10.6% change in FEV1 is similar to past spirometry reversal with albuterol  Plan to treat him as asthma/COPD overall syndrome. CAT score of 32 and Lung age of 45 are significantly worsened since last evaluation.  S/sx likely attributed to asthma/COPD overlap syndrome due to long hx of smoking and slight reversibility . Patient has been experiencing shortness of breath, wheezing, and coughing since his last doctor's office visit and is currently taking albuterol. Begin Trelegy (fluticasone furoate, umeclidinium, and vilanterol inhalation powder) Ellipta inhaler (LABA/LAMA/ICS) one inhalation daily  treatment plan at this time.  Educated patient on purpose, proper use, potential adverse effects including increased risk of esophageal candidiasis and need to rinse mouth after each use. Avoided LAMA treatment alone and ICS treatment alone due to treatment failure (patient reported Spiriva and Flovent "did not work for him").   Reviewed results of pulmonary function tests.    Chronic severe tobacco abuse.  Currently 2 ppd and uninterested in quit attempt in the near future.  Discussed reducing from  2 ppd to 1.5ppd. Reevaluate at next visit.   Pt verbalized understanding of results and education.  Written pt instructions provided.   Total time in face to face counseling 45 minutes.  Patient seen with Hildred Alamin, PharmD Candidate and Jalene Mullet, PharmD, PGY1 Resident and Deirdre Pippins, PGY2 Pharmacy Resident, PharmD, BCPS.  [place] Kitchen

## 2017-10-03 NOTE — Telephone Encounter (Signed)
PA for oxycodone completed via [place] [facility]. Med approved for 10/02/17 until 11/01/17.  Wallace Cullens, RN

## 2017-10-03 NOTE — Assessment & Plan Note (Signed)
-   Refilled diazepam. Reviewed importance of not drinking with this medication in combination with opioid use

## 2017-10-03 NOTE — Assessment & Plan Note (Addendum)
-   Stable. Secondary to multiple abdominal surgeries. - Plan to continue decreasing opioid rx given no plans for surgery and substance abuselast fall.Will keep at #25today due to exacerbating factor of uncontrolled cough. Down from #75 last year.  - Will plan to check UDS in 1-2 months. Discussed with patient. If positive for non-prescribed medications will need to hasten taper, as patient also on benzodiazepine (also working to taper).  - Continued to recommend following up with Dr. Gwenlyn Saran to discuss mood and work on coping strategies   Indication for chronic opioid:chronic abdominal pain Medication and dose:oxycodone-acetaminophen 10-325 mg # pills per month:#25(tapering down from 75 last year) Last UDS date:03/31/17 Pain contract signed (Y/N):Y Date narcotic database last reviewed (include red flags):09/30/17; no red flags

## 2017-10-04 ENCOUNTER — Encounter: Payer: Self-pay | Admitting: Internal Medicine

## 2017-10-04 NOTE — Assessment & Plan Note (Signed)
-   Not ready to quit at this time.

## 2017-10-04 NOTE — Assessment & Plan Note (Signed)
-   No focal findings to suggest pneumonia, but symptoms ongoing for 2-3 months so will obtain chest xray. Suspect chronic bronchitis from heavy tobacco abuse. Do not suspect COPD exacerbation due to lack of increased sputum production and cough has been waxing and waning.  - Gave prescription for short course of PO prednisone in case wheezing gets worse.   - Patient is to follow-up with Dr. Valentina Lucks tomorrow, 3/14, for repeat lung function testing and suggestions on different maintenance inhaler to try

## 2017-10-23 ENCOUNTER — Other Ambulatory Visit: Payer: Self-pay | Admitting: Internal Medicine

## 2017-10-27 ENCOUNTER — Encounter: Payer: Self-pay | Admitting: Internal Medicine

## 2017-10-27 ENCOUNTER — Ambulatory Visit (INDEPENDENT_AMBULATORY_CARE_PROVIDER_SITE_OTHER): Payer: Medicaid Other | Admitting: Internal Medicine

## 2017-10-27 ENCOUNTER — Other Ambulatory Visit: Payer: Self-pay

## 2017-10-27 VITALS — BP 119/80 | HR 95 | Temp 97.8°F | Ht 71.0 in | Wt 171.6 lb

## 2017-10-27 DIAGNOSIS — J449 Chronic obstructive pulmonary disease, unspecified: Secondary | ICD-10-CM | POA: Diagnosis not present

## 2017-10-27 DIAGNOSIS — R0602 Shortness of breath: Secondary | ICD-10-CM

## 2017-10-27 DIAGNOSIS — G894 Chronic pain syndrome: Secondary | ICD-10-CM

## 2017-10-27 DIAGNOSIS — J441 Chronic obstructive pulmonary disease with (acute) exacerbation: Secondary | ICD-10-CM

## 2017-10-27 MED ORDER — OXYCODONE-ACETAMINOPHEN 10-325 MG PO TABS: 1.0000 | ORAL_TABLET | Freq: Three times a day (TID) | ORAL | 0 refills | Status: DC | PRN

## 2017-10-27 MED ORDER — ALBUTEROL SULFATE HFA 108 (90 BASE) MCG/ACT IN AERS: 2.0000 | INHALATION_SPRAY | Freq: Four times a day (QID) | RESPIRATORY_TRACT | 1 refills | Status: DC | PRN

## 2017-10-27 MED ORDER — CYCLOBENZAPRINE HCL 10 MG PO TABS: ORAL_TABLET | ORAL | 2 refills | Status: DC

## 2017-10-27 MED ORDER — DIAZEPAM 5 MG PO TABS: 5.0000 mg | ORAL_TABLET | Freq: Every day | ORAL | 0 refills | Status: DC | PRN

## 2017-10-27 NOTE — Assessment & Plan Note (Addendum)
-   Stable. Secondary to multiple abdominal surgeries. - Plan to continue decreasing opioid rx given no plans for surgery and substance abuselast fall.Will decrease to #22 today from #25 last month.Down from #75 last year. - Will plan to check UDS in 1-2 months.  - Continue to decrease benzodiazepine use. Ordered #25 valium this month, decrease from #30, and encouraged only prn use.  - Continued to recommend following up with Dr. Gwenlyn Saran to discuss mood and work on coping strategies  - Consider referral to PT for dry needling of abdominal scar (as patient typically complains of focalized pain over scar near umbilicus and did not like lidocaine patch)  Indication for chronic opioid:chronic abdominal pain Medication and dose:oxycodone-acetaminophen 10-325 mg # pills per month:#22(tapering down from 75 last year) Last UDS date:03/31/17 Pain contract signed (Y/N):Y Date narcotic database last reviewed (include red flags):10/27/17; no red flags

## 2017-10-27 NOTE — Progress Notes (Signed)
[name] Progress Note  Subjective:  Timothy [name] is a 52 y.o. male with history of chronic abdominal pain after multiple abdominal surgeries, bipolar disorder, anxiety, tobacco abuse, and COPD who presents for chronic pain management.   #Chronic pain: - He still is having good days and bad days regarding abdominal pain. He did run out of percocet a few days early but thinks things are going well and is agreeable to continuing taper. Cough had been exacerbating abdominal pain but trelegy inhaler has been working well. However, he says trelegy has not been filled by pharmacy yet, though he has not run out of samples.  - Flexeril continues to help sensation of "Timothy [name]" that occurs in abdomen at times - He plans to start an exercise regimen with walking and weight training. He is hopeful this will help with general wellbeing.  - He reports still having 4 valium left. He has been taking as needed rather than scheduled as discussed.  - He continues to have goal of cutting back smoking to 1.5 ppd from 2 ppd but has difficulty because family members also smoke around him. He says he is not ready to think about quitting despite information of much worsened lung age on recent PFTs.  - Continues to take protonix due to history of stomach ulcer ROS: No dark stools, no decreased appetite  Social: Smoking 2 ppd. Has short-term goal to reduce to 1.5 ppd.   Allergies  Allergen Reactions  . Ambien [Zolpidem Tartrate] Other (See Comments)    Caused agitation of mood.   [name] [Propoxyphene N-Acetaminophen] Hives and Itching  . Morphine And Related Itching  . Bactroban [Mupirocin Calcium] Swelling    Caused infection at a surgical site  . Lisinopril Cough    Social History   Tobacco Use  . Smoking status: Current Every Day Smoker    Packs/day: 2.00    Years: 34.00    Pack years: 68.00    Types: Cigarettes    Start date: 07/22/1981  . Smokeless tobacco: Former Systems developer   Types: Chew    Quit date: 12/12/2010  . Tobacco comment: Current 2 PPD smoker - [place]  Substance Use Topics  . Alcohol use: No    Alcohol/week: 0.0 oz    Objective: Blood pressure 119/80, pulse 95, temperature 97.8 F (36.6 C), temperature source Oral, height 5\' 11"  (1.803 m), weight 171 lb 9.6 oz (77.8 kg), SpO2 97 %. Body mass index is 23.93 kg/m. Constitutional: Pleasant male in NAD Cardiovascular: RRR, S1, S2, no m/r/g.  Pulmonary/Chest: Effort normal and breath sounds normal.  Abdominal: Soft. +BS, NT Skin: Skin is warm and dry. Abdominal scars noted without drainage Psychiatric: Normal mood and affect.  Vitals reviewed  Assessment/Plan: Chronic pain syndrome - Stable. Secondary to multiple abdominal surgeries. - Plan to continue decreasing opioid rx given no plans for surgery and substance abuselast fall.Will decrease to #22 today from #25 last month.Down from #75 last year. - Will plan to check UDS in 1-2 months.  - Continue to decrease benzodiazepine use. Ordered #25 valium this month, decrease from #30, and encouraged only prn use.  - Continued to recommend following up with Dr. Gwenlyn Saran to discuss mood and work on coping strategies  - Consider referral to PT for dry needling of abdominal scar (as patient typically complains of focalized pain over scar near umbilicus and did not like lidocaine patch)  Indication for chronic opioid:chronic abdominal pain Medication and dose:oxycodone-acetaminophen 10-325 mg # pills per  month:#22(tapering down from 75 last year) Last UDS date:03/31/17 Pain contract signed (Y/N):Y Date narcotic database last reviewed (include red flags):10/27/17; no red flags  Chronic obstructive pulmonary disease ([place]) -Will check with pharmacy about trelegy refills, as keeping COPD controlled helps limit flares of abdominal pain - Patient to continue to work towards reducing cigarette use  Follow-up next month to reassess pain.  Olene Floss, MD [place], PGY-3

## 2017-10-27 NOTE — Patient Instructions (Signed)
Timothy [name],  I'm impressed by your exercise goals!  I will call your pharmacy about the trelegy.  You have refills of buspar and latuda.   I placed refills of flexeril and albuterol.  Best, Dr. Ola Spurr

## 2017-10-29 ENCOUNTER — Encounter: Payer: Self-pay | Admitting: Internal Medicine

## 2017-10-29 ENCOUNTER — Telehealth: Payer: Self-pay | Admitting: Internal Medicine

## 2017-10-29 NOTE — Assessment & Plan Note (Addendum)
-  Will check with pharmacy about trelegy refills, as keeping COPD controlled helps limit flares of abdominal pain - Patient to continue to work towards reducing cigarette use

## 2017-10-29 NOTE — Telephone Encounter (Signed)
Reached voicemail. Called patient to let him know I am working on the prior authorization for trelegy. ([place] [employer] ticket number [id number]). Advised he may need to try another preferred agent before this is approved but that he'll get a call if that is the case.  Olene Floss, MD [place], PGY-3

## 2017-10-30 ENCOUNTER — Telehealth: Payer: Self-pay

## 2017-10-30 NOTE — Telephone Encounter (Signed)
Received fax from [place] requesting prior authorization of trelegy ellipta .  Form placed in MD's box for completion along with Medicaid formulary.  Wallace Cullens, RN

## 2017-10-31 NOTE — Telephone Encounter (Signed)
Prior approval for Trelegy Ellipta completed via [place] [employer].  Med approved for 10/31/17 - 10/31/18  Prior approval # [id number] W  pharmacy informed.  Wallace Cullens, RN

## 2017-11-17 ENCOUNTER — Telehealth: Payer: Self-pay | Admitting: Internal Medicine

## 2017-11-17 MED ORDER — MOMETASONE FURO-FORMOTEROL FUM 100-5 MCG/ACT IN AERO: 2.0000 | INHALATION_SPRAY | Freq: Two times a day (BID) | RESPIRATORY_TRACT | 2 refills | Status: DC

## 2017-11-17 NOTE — Telephone Encounter (Signed)
Spoke with patient about starting dulera, as Medicaid will not approve trelegy unless he does not have therapeutic response to preferred alternatives. Patient expressed understanding.  Olene Floss, MD [place], PGY-3

## 2017-12-01 ENCOUNTER — Other Ambulatory Visit: Payer: Self-pay

## 2017-12-01 ENCOUNTER — Encounter: Payer: Self-pay | Admitting: Internal Medicine

## 2017-12-01 ENCOUNTER — Ambulatory Visit (INDEPENDENT_AMBULATORY_CARE_PROVIDER_SITE_OTHER): Payer: Medicaid Other | Admitting: Internal Medicine

## 2017-12-01 VITALS — BP 134/80 | HR 88 | Temp 98.3°F | Wt 169.4 lb

## 2017-12-01 DIAGNOSIS — G894 Chronic pain syndrome: Secondary | ICD-10-CM | POA: Diagnosis not present

## 2017-12-01 DIAGNOSIS — J441 Chronic obstructive pulmonary disease with (acute) exacerbation: Secondary | ICD-10-CM

## 2017-12-01 DIAGNOSIS — J449 Chronic obstructive pulmonary disease, unspecified: Secondary | ICD-10-CM | POA: Diagnosis not present

## 2017-12-01 DIAGNOSIS — Z720 Tobacco use: Secondary | ICD-10-CM | POA: Diagnosis not present

## 2017-12-01 MED ORDER — OXYCODONE-ACETAMINOPHEN 10-325 MG PO TABS: 1.0000 | ORAL_TABLET | Freq: Three times a day (TID) | ORAL | 0 refills | Status: DC | PRN

## 2017-12-01 MED ORDER — FLUTICASONE-UMECLIDIN-VILANT 100-62.5-25 MCG/INH IN AEPB: 1.0000 | INHALATION_SPRAY | Freq: Every day | RESPIRATORY_TRACT | 5 refills | Status: DC

## 2017-12-01 MED ORDER — AMOXICILLIN-POT CLAVULANATE 875-125 MG PO TABS: 1.0000 | ORAL_TABLET | Freq: Two times a day (BID) | ORAL | 0 refills | Status: DC

## 2017-12-01 MED ORDER — DIAZEPAM 5 MG PO TABS: 5.0000 mg | ORAL_TABLET | Freq: Every day | ORAL | 0 refills | Status: DC | PRN

## 2017-12-01 MED ORDER — PREDNISONE 50 MG PO TABS: ORAL_TABLET | ORAL | 0 refills | Status: DC

## 2017-12-01 NOTE — Patient Instructions (Signed)
Mr. Gates,  Please take augmentin twice daily for the next 7 days. Take prednisone for the next 5 days. Continue trelegy daily. Take albuterol every 4-6 hours as needed.  I have refilled oxycodone and valium.  Best, Dr. Ola Spurr

## 2017-12-01 NOTE — Progress Notes (Signed)
[name] Progress Note  Subjective:  Timothy [name] is a 52 y.o. male with history of chronic pain after multiple abdominal surgeries, tobacco abuse, COPD, and bipolar disorder who presents for follow-up chronic pain and increased phlegm.   #Chronic pain: - Ran out of medication about 2 days ago. Abdominal pain worsened by cough, which has been flaring up in setting of decreasing cigarette use. - Have decreased from oxycodone-tylenol 10-325 #75 monthly last year (when had ulcer) to #22 last month. Goal to wean off given risk factors of previous drug abuse and use of benzodiazepine.  - Takes flexeril prn for spasms - Continues to break pills in half, was doing alright with spacing them out this month until cough worsened - Has been able to be more active recently--walking around neighborhood ROS: No dark stools, no constipation  #Increased phlegm:  - Has noticed more phlegm, a bad taste in his mouth sinceThursday - Has also had headaches, improved by [place] powders - Denies increased SOB - Tried benadryl thinking his symptoms might be related to allergies but had no improvement - Has not been using albuterol as often since cutting back cigarette use - Continues to use trelegy inhaler daily ROS: No fever  #Tobacco abuse: - Has decreased from 2 ppd to 1 ppd using juul e-cigarette - Noticed increased cough with cutting back smoking  Allergies  Allergen Reactions  . Ambien [Zolpidem Tartrate] Other (See Comments)    Caused agitation of mood.   [name] [Propoxyphene N-Acetaminophen] Hives and Itching  . Morphine And Related Itching  . Bactroban [Mupirocin Calcium] Swelling    Caused infection at a surgical site  . Lisinopril Cough   Objective: Blood pressure 134/80, pulse 88, temperature 98.3 F (36.8 C), temperature source Oral, weight 169 lb 6.4 oz (76.8 kg), SpO2 98 %. Body mass index is 23.63 kg/m. Constitutional: Well-appearing male in NAD HENT: Perforated  nasal septum (says from cautery procedure as a kid for chronic nose bleeds). Fluid behind TMs. Normal posterior oropharynx. Minimal nasal congestion.  Cardiovascular: RRR, S1, S2, no m/r/g.  Pulmonary/Chest: Diffuse expiratory wheezes throughout posterior lung fields with decreased air movement.  Abdominal: Soft. +BS, NT. No drainage.  Neurological: AOx3, no focal deficits. Skin: Skin is warm and dry. No rash noted.  Psychiatric: Normal mood and affect.  Vitals reviewed  Assessment/Plan: COPD exacerbation (HCC) - Increased sputum production and cough, may be in setting of reduced tobacco use--could be ciliary recovery, but will treat as COPD exacerbation given poor air movement on exam. - Ordered augmentin x 7 days, prednisone 50 mg x 5 days - Refilled trelegy inhaler - Counseled to use albuterol more often as needed for cough or shortness of breath - Return towards the end of the week if no improvement over the next couple of days  Chronic pain syndrome - Stable.Secondary to multiple abdominal surgeries. - Will refill #22 this month (same as last month) due to increased cough.Down from #75 last year.Continue taper by 10% or more each month as respiratory status allows.  - Will plan to check UDS next month--had been out of medicine a couple days today. - Continue to decrease benzodiazepine use as able. Ordered #25 valium this month.  - Suggested dry needling given localized pain over scarring but patient is averse to needles  Indication for chronic opioid:chronic abdominal pain Medication and dose:oxycodone-acetaminophen 10-325 mg # pills per month:#22(tapering down from 75 last year) Last UDS date:03/31/17 Pain contract signed (Y/N):Y Date narcotic database last  reviewed (include red flags):12/01/17; no red flags  Tobacco abuse - Patient cutting back with use of e-cigarette.   Follow-up next month.  Olene Floss, MD [place], PGY-3

## 2017-12-02 ENCOUNTER — Encounter: Payer: Self-pay | Admitting: Internal Medicine

## 2017-12-02 NOTE — Assessment & Plan Note (Signed)
-   Increased sputum production and cough, may be in setting of reduced tobacco use--could be ciliary recovery, but will treat as COPD exacerbation given poor air movement on exam. - Ordered augmentin x 7 days, prednisone 50 mg x 5 days - Refilled trelegy inhaler - Counseled to use albuterol more often as needed for cough or shortness of breath - Return towards the end of the week if no improvement over the next couple of days

## 2017-12-02 NOTE — Assessment & Plan Note (Signed)
-   Patient cutting back with use of e-cigarette.

## 2017-12-02 NOTE — Assessment & Plan Note (Addendum)
-   Stable.Secondary to multiple abdominal surgeries. - Will refill #22 this month (same as last month) due to increased cough.Down from #75 last year.Continue taper by 10% or more each month as respiratory status allows.  - Will plan to check UDS next month--had been out of medicine a couple days today. - Continue to decrease benzodiazepine use as able. Ordered #25 valium this month.  - Suggested dry needling given localized pain over scarring but patient is averse to needles  Indication for chronic opioid:chronic abdominal pain Medication and dose:oxycodone-acetaminophen 10-325 mg # pills per month:#22(tapering down from 75 last year) Last UDS date:03/31/17 Pain contract signed (Y/N):Y Date narcotic database last reviewed (include red flags):12/01/17; no red flags

## 2017-12-29 ENCOUNTER — Encounter: Payer: Self-pay | Admitting: Internal Medicine

## 2017-12-29 ENCOUNTER — Other Ambulatory Visit: Payer: Self-pay

## 2017-12-29 ENCOUNTER — Ambulatory Visit (INDEPENDENT_AMBULATORY_CARE_PROVIDER_SITE_OTHER): Payer: Medicaid Other | Admitting: Internal Medicine

## 2017-12-29 VITALS — BP 132/80 | HR 82 | Temp 97.8°F | Wt 166.0 lb

## 2017-12-29 DIAGNOSIS — R1084 Generalized abdominal pain: Secondary | ICD-10-CM

## 2017-12-29 DIAGNOSIS — G894 Chronic pain syndrome: Secondary | ICD-10-CM | POA: Diagnosis present

## 2017-12-29 DIAGNOSIS — J441 Chronic obstructive pulmonary disease with (acute) exacerbation: Secondary | ICD-10-CM

## 2017-12-29 DIAGNOSIS — K219 Gastro-esophageal reflux disease without esophagitis: Secondary | ICD-10-CM | POA: Diagnosis not present

## 2017-12-29 DIAGNOSIS — R112 Nausea with vomiting, unspecified: Secondary | ICD-10-CM

## 2017-12-29 MED ORDER — LEVOFLOXACIN 500 MG PO TABS: 500.0000 mg | ORAL_TABLET | Freq: Every day | ORAL | 0 refills | Status: DC

## 2017-12-29 MED ORDER — PREDNISONE 50 MG PO TABS: ORAL_TABLET | ORAL | 0 refills | Status: DC

## 2017-12-29 MED ORDER — DIAZEPAM 5 MG PO TABS: 5.0000 mg | ORAL_TABLET | Freq: Every day | ORAL | 0 refills | Status: DC | PRN

## 2017-12-29 MED ORDER — PANTOPRAZOLE SODIUM 40 MG PO TBEC: 40.0000 mg | DELAYED_RELEASE_TABLET | Freq: Two times a day (BID) | ORAL | 3 refills | Status: DC

## 2017-12-29 MED ORDER — OXYCODONE-ACETAMINOPHEN 10-325 MG PO TABS: 1.0000 | ORAL_TABLET | Freq: Two times a day (BID) | ORAL | 0 refills | Status: DC | PRN

## 2017-12-29 NOTE — Patient Instructions (Addendum)
[name],  Please take prednisone for the next 5 days at breakfast and levaquin 500 mg once daily for the next 7 days for COPD flare. Please see me back next week, as we may need to adjust your regular medications if you do not note improvement.  Continue the oxycodone as needed, trying to limit use as much as possible.   Best, Dr. Ola Spurr

## 2017-12-29 NOTE — Progress Notes (Signed)
[name] Progress Note  Subjective:  Timothy [name] is a 52 y.o. male with history of COPD, chronic abdominal pain after multiple surgeries, tobacco abuse, and bipolar disorder who presents for follow-up pain and with breathing concern.  #Abdominal Pain: - Area of right lower/mid quadrant starting to get tender again over the last few days and a little red. No drainage. - Patient agreeable to opioid taper. Was on up to #75 oxycodone-acetaminophen 10-325 mg last year in setting of stomach ulcer. Has been stable on ~#22 pills for last few months due to COPD exacerbations causing increased coughing and worsening abdominal pain. Has not asked for additional medication. - Also takes flexeril prn. Does not like the way gabapentin made him feel so has not been taking. Amitriptyline made him feel too sleepy.  - No plans for further abdominal surgery.  - GERD well controlled and not contributing. Patient avoiding alcohol. ROS: No hematochezia, no fever, no vomiting, no constipation  #COPD: - had more trouble breathing in "hot weather" last week. Found himself getting very short of breath while running errands. Improved some with albuterol use.  - does not feel "bad" but has noticed spitting up more phlegm and increased dyspnea - Received augmentin and prednisone last month for COPD exacerbation - Has been taking trelegy and has not had difficulty obtaining from pharmacy - Has cut back smoking to about 1 ppd from 2 ppd   Allergies  Allergen Reactions  . Ambien [Zolpidem Tartrate] Other (See Comments)    Caused agitation of mood.   [name] [Propoxyphene N-Acetaminophen] Hives and Itching  . Morphine And Related Itching  . Bactroban [Mupirocin Calcium] Swelling    Caused infection at a surgical site  . Lisinopril Cough    Social History   Tobacco Use  . Smoking status: Current Every Day Smoker    Packs/day: 2.00    Years: 34.00    Pack years: 68.00    Types:  Cigarettes    Start date: 07/22/1981  . Smokeless tobacco: Former Systems developer    Types: Chew    Quit date: 12/12/2010  . Tobacco comment: Current 2 PPD smoker - [place] [employer]  Substance Use Topics  . Alcohol use: No    Alcohol/week: 0.0 oz    Objective: Blood pressure 132/80, pulse 82, temperature 97.8 F (36.6 C), temperature source Oral, weight 166 lb (75.3 kg), SpO2 98 %. Body mass index is 23.15 kg/m. Constitutional: Well-appearing male in NAD HENT: Mild nasal congestion Cardiovascular: RRR, S1, S2, no m/r/g.  Pulmonary/Chest: Diffuse expiratory wheezing but normal effort Abdominal: Soft. +BS, mildly TTP over scar below umbilicus in RLQ Neurological: AOx3, no focal deficits. Skin: Skin is warm and dry. Slight pinkness to abdominal scar but no increased warmth or surrounding erythema and no skin breakdown Psychiatric: Normal mood and affect.  Vitals reviewed  Assessment/Plan: Chronic pain syndrome - Pain increased in setting of COPD exacerbation with coughing.Secondary to multiple abdominal surgeries. - Will refill #22 this month (same as last month) due to increased cough.Down from #75 last year.Continue taper by 10% or more each month as respiratory status allows.  - Will plan to check UDS next month--had been out of medicine a couple days today and urinated prior to appointment.  - Continue to decrease benzodiazepine use as able. Ordered #25 valium this month--doing well not taking every day but has gotten jittery with fewer. Would quickly taper if UDS inappropriate next month.  - Encouraged patient to stay as active as possible  Indication for chronic opioid:chronic abdominal pain Medication and dose:oxycodone-acetaminophen 10-325 mg # pills per month:#22(tapering down from 75 last year) Last UDS date:03/31/17 Pain contract signed (Y/N):Y Date narcotic database last reviewed (include red flags):12/29/17; no red flags  COPD exacerbation ([place]) - Still symptomatic after  augmentin and prednisone last month. No focal lung findings to suggest pneumonia. - Prescribed levaquin x 7 days and prednisone. - May need appointment with Dr. Valentina Lucks to optimize regimen if no improvement although already on LABA/LAMA/ICS inhaler. Question whether he would be a candidate for macrolide prophylaxis. Increased sputum production may also be in setting of recent reduction in smoking. However, control of breathing symptoms is limiting opioid wean.    Follow-up next week if no improvement in breathing or next month for chronic pain management with UDS.  Olene Floss, MD [place], PGY-3

## 2017-12-30 ENCOUNTER — Telehealth: Payer: Self-pay

## 2017-12-30 NOTE — Telephone Encounter (Signed)
Received fax from [place] requesting prior authorization of Oxycodone/Acetaminophen 10-325. Form placed in MD's box for completion along with Medicaid formulary.  Danley Danker, RN [facility] RN)

## 2017-12-31 ENCOUNTER — Encounter: Payer: Self-pay | Admitting: Internal Medicine

## 2017-12-31 NOTE — Telephone Encounter (Signed)
Completed PA info in [place] for Oxycodone-Acetaminophen tabs. Status pending.  Will recheck status in 24 hours. Danley Danker, RN [facility] RN)

## 2017-12-31 NOTE — Telephone Encounter (Signed)
Placed PA in nurse folder. Thank you.  Olene Floss, MD [place], PGY-3

## 2017-12-31 NOTE — Assessment & Plan Note (Signed)
-   Pain increased in setting of COPD exacerbation with coughing.Secondary to multiple abdominal surgeries. - Will refill #22 this month (same as last month) due to increased cough.Down from #75 last year.Continue taper by 10% or more each month as respiratory status allows.  - Will plan to check UDS next month--had been out of medicine a couple days today and urinated prior to appointment.  - Continue to decrease benzodiazepine use as able. Ordered #25 valium this month--doing well not taking every day but has gotten jittery with fewer. Would quickly taper if UDS inappropriate next month.  - Encouraged patient to stay as active as possible  Indication for chronic opioid:chronic abdominal pain Medication and dose:oxycodone-acetaminophen 10-325 mg # pills per month:#22(tapering down from 75 last year) Last UDS date:03/31/17 Pain contract signed (Y/N):Y Date narcotic database last reviewed (include red flags):12/29/17; no red flags

## 2017-12-31 NOTE — Telephone Encounter (Signed)
Prior approval for Oxycodone/Acetaminophen completed via [place] [employer].  Med approved for 12/31/17 - 06/29/18  Prior approval # [id number].  [place] pharmacy informed.  Danley Danker, RN [facility] RN)

## 2017-12-31 NOTE — Assessment & Plan Note (Signed)
-   Still symptomatic after augmentin and prednisone last month. No focal lung findings to suggest pneumonia. - Prescribed levaquin x 7 days and prednisone. - May need appointment with Dr. Valentina Lucks to optimize regimen if no improvement although already on LABA/LAMA/ICS inhaler. Question whether he would be a candidate for macrolide prophylaxis. Increased sputum production may also be in setting of recent reduction in smoking. However, control of breathing symptoms is limiting opioid wean.

## 2018-01-27 ENCOUNTER — Encounter: Payer: Self-pay | Admitting: Family Medicine

## 2018-01-27 ENCOUNTER — Ambulatory Visit (INDEPENDENT_AMBULATORY_CARE_PROVIDER_SITE_OTHER): Payer: Medicaid Other | Admitting: Family Medicine

## 2018-01-27 VITALS — BP 129/80 | HR 113 | Temp 98.2°F | Ht 71.0 in | Wt 169.8 lb

## 2018-01-27 DIAGNOSIS — F410 Panic disorder [episodic paroxysmal anxiety] without agoraphobia: Secondary | ICD-10-CM

## 2018-01-27 DIAGNOSIS — G894 Chronic pain syndrome: Secondary | ICD-10-CM

## 2018-01-27 MED ORDER — OXYCODONE-ACETAMINOPHEN 10-325 MG PO TABS: 1.0000 | ORAL_TABLET | Freq: Two times a day (BID) | ORAL | 0 refills | Status: DC | PRN

## 2018-01-27 MED ORDER — CYCLOBENZAPRINE HCL 10 MG PO TABS: ORAL_TABLET | ORAL | 2 refills | Status: DC

## 2018-01-27 MED ORDER — DIAZEPAM 5 MG PO TABS: 5.0000 mg | ORAL_TABLET | Freq: Every day | ORAL | 0 refills | Status: DC | PRN

## 2018-01-27 NOTE — Progress Notes (Signed)
Subjective: No chief complaint on file.    HPI: Timothy [name] is a 52 y.o. presenting to clinic today to discuss the following:  Medication Refills Patient is new to me but a long term patient here at this clinic. Was under the care of Dr. Ola Spurr and meeting him today to establish care with me and to go over plan of tapering down his pain medication and anxiety medications. Discussed dangers of long-term pain medication taken with benzodiazepines. Patient expressed understanding of plan to taper. No tapering today as I just wanted to establish rapport with patient. Patient knows I will continue the tapering plan set forward by Dr. Ola Spurr and is in agreement to attempt as best he can to go along with the tapering plan.  Health Maintenance: none     ROS noted in HPI.   Past Medical, Surgical, Social, and Family History Reviewed & Updated per EMR.   Pertinent Historical Findings include:   Social History   Tobacco Use  Smoking Status Current Every Day Smoker  . Packs/day: 2.00  . Years: 34.00  . Pack years: 68.00  . Types: Cigarettes  . Start date: 07/22/1981  Smokeless Tobacco Former Systems developer  . Types: Chew  . Quit date: 12/12/2010  Tobacco Comment   Current 2 PPD smoker - [place]      Objective: BP 129/80 (BP Location: Left Arm, Patient Position: Sitting, Cuff Size: Normal)   Pulse (!) 113   Temp 98.2 F (36.8 C) (Oral)   Ht 5\' 11"  (1.803 m)   Wt 169 lb 12.8 oz (77 kg)   SpO2 97%   BMI 23.68 kg/m  Vitals and nursing notes reviewed  Physical Exam Gen: Alert and Oriented x 3, NAD HEENT: Normocephalic, atraumatic, PERRLA, EOMI CV: RRR, no murmurs, normal S1, S2 split Resp: diffuse wheezing bilaterally, poor air flow in all lung fields. no rales, or rhonchi, comfortable work of breathing Abd: non-distended, multiple scars from previous surgeries, non-tender, soft, +bs in all four quadrants, MSK: FROM in all four extremities Ext: no clubbing, cyanosis,  or edema Skin: warm, dry, intact, no rashes Psych: appropriate behavior, mood  No results found for this or any previous visit (from the past 72 hour(s)).  Assessment/Plan:  Chronic pain syndrome Cont excellent plan established by Dr. Ola Spurr to taper by 10% each month. Received 22 pills this month. Will reduce to 20 next month of 10-325 percocet.  Check UDS at next visit.  Indication for chronic opioid: chronic abdominal pain 2/2 multiple abdominal surgeries Medication and dose: oxycodone-acetaminophen 10-325mg  Pills per month: #22 Pain contract signed: Yes Date narcotic database last reviewed: 12/29/2017, no red flags  Panic anxiety syndrome Refilled diazepam with #25 for this month.   Continue to taper down 10% as well. Risk factors discussed with patient.   PATIENT EDUCATION PROVIDED: See AVS    Diagnosis and plan along with any newly prescribed medication(s) were discussed in detail with this patient today. The patient verbalized understanding and agreed with the plan. Patient advised if symptoms worsen return to clinic or ER.   Health Maintainance:   No orders of the defined types were placed in this encounter.   Meds ordered this encounter  Medications  . diazepam (VALIUM) 5 MG tablet    Sig: Take 1 tablet (5 mg total) by mouth daily as needed for anxiety or muscle spasms.    Dispense:  25 tablet    Refill:  0  . oxyCODONE-acetaminophen (PERCOCET) 10-325 MG tablet  Sig: Take 1 tablet by mouth 2 (two) times daily as needed for pain.    Dispense:  22 tablet    Refill:  0  . DISCONTD: cyclobenzaprine (FLEXERIL) 10 MG tablet    Sig: TAKE 1 TABLET BY MOUTH EVERY DAY AS NEEDED FOR MUSCLE SPASMS    Dispense:  30 tablet    Refill:  2  . cyclobenzaprine (FLEXERIL) 10 MG tablet    Sig: TAKE 1 TABLET BY MOUTH EVERY DAY AS NEEDED FOR MUSCLE SPASMS    Dispense:  30 tablet    Refill:  2     Harolyn Rutherford, DO 01/27/2018, 3:03 PM PGY-2, [place]

## 2018-01-27 NOTE — Patient Instructions (Signed)
It was great to meet you today! Thank you for letting me participate in your care!  Today, we discussed your continued pain, anxiety, and smoking.  I will continue to refill your pain medication and your valium for your anxiety. Please return in one month and we will continue to taper these medications down.  Be well, Harolyn Rutherford, DO PGY-1, [name]

## 2018-01-29 NOTE — Assessment & Plan Note (Addendum)
Refilled diazepam with #25 for this month.   Continue to taper down 10% as well. Risk factors discussed with patient.

## 2018-01-29 NOTE — Assessment & Plan Note (Addendum)
Cont excellent plan established by Dr. Ola Spurr to taper by 10% each month. Received 22 pills this month. Will reduce to 20 next month of 10-325 percocet.  Check UDS at next visit.  Indication for chronic opioid: chronic abdominal pain 2/2 multiple abdominal surgeries Medication and dose: oxycodone-acetaminophen 10-325mg  Pills per month: #22 Pain contract signed: Yes Date narcotic database last reviewed: 12/29/2017, no red flags

## 2018-02-02 ENCOUNTER — Ambulatory Visit (INDEPENDENT_AMBULATORY_CARE_PROVIDER_SITE_OTHER): Payer: Medicaid Other | Admitting: Family Medicine

## 2018-02-02 ENCOUNTER — Emergency Department (HOSPITAL_COMMUNITY): Payer: Medicaid Other

## 2018-02-02 ENCOUNTER — Emergency Department (HOSPITAL_COMMUNITY)
Admission: EM | Admit: 2018-02-02 | Discharge: 2018-02-03 | Disposition: A | Discharge status: Home / Self Care | Payer: Medicaid Other | Attending: Emergency Medicine | Admitting: Emergency Medicine

## 2018-02-02 ENCOUNTER — Other Ambulatory Visit: Payer: Self-pay

## 2018-02-02 ENCOUNTER — Encounter (HOSPITAL_COMMUNITY): Payer: Self-pay | Admitting: Emergency Medicine

## 2018-02-02 VITALS — BP 136/90 | HR 108 | Temp 97.9°F | Ht 71.0 in | Wt 168.6 lb

## 2018-02-02 DIAGNOSIS — L03311 Cellulitis of abdominal wall: Secondary | ICD-10-CM | POA: Insufficient documentation

## 2018-02-02 DIAGNOSIS — F17210 Nicotine dependence, cigarettes, uncomplicated: Secondary | ICD-10-CM | POA: Insufficient documentation

## 2018-02-02 DIAGNOSIS — I10 Essential (primary) hypertension: Secondary | ICD-10-CM | POA: Insufficient documentation

## 2018-02-02 DIAGNOSIS — Z79899 Other long term (current) drug therapy: Secondary | ICD-10-CM | POA: Diagnosis not present

## 2018-02-02 DIAGNOSIS — J449 Chronic obstructive pulmonary disease, unspecified: Secondary | ICD-10-CM | POA: Diagnosis not present

## 2018-02-02 DIAGNOSIS — R234 Changes in skin texture: Secondary | ICD-10-CM | POA: Diagnosis present

## 2018-02-02 DIAGNOSIS — R1900 Intra-abdominal and pelvic swelling, mass and lump, unspecified site: Secondary | ICD-10-CM | POA: Diagnosis present

## 2018-02-02 LAB — CBC
HCT: 42.2 % (ref 39.0–52.0)
HEMOGLOBIN: 13.3 g/dL (ref 13.0–17.0)
MCH: 30.2 pg (ref 26.0–34.0)
MCHC: 31.5 g/dL (ref 30.0–36.0)
MCV: 95.9 fL (ref 78.0–100.0)
PLATELETS: 363 10*3/uL (ref 150–400)
RBC: 4.4 MIL/uL (ref 4.22–5.81)
RDW: 12.8 % (ref 11.5–15.5)
WBC: 8.6 10*3/uL (ref 4.0–10.5)

## 2018-02-02 LAB — COMPREHENSIVE METABOLIC PANEL
ALK PHOS: 87 U/L (ref 38–126)
ALT: 13 U/L (ref 0–44)
ANION GAP: 11 (ref 5–15)
AST: 16 U/L (ref 15–41)
Albumin: 3.6 g/dL (ref 3.5–5.0)
BILIRUBIN TOTAL: 0.4 mg/dL (ref 0.3–1.2)
BUN: 6 mg/dL (ref 6–20)
CALCIUM: 9.3 mg/dL (ref 8.9–10.3)
CO2: 24 mmol/L (ref 22–32)
Chloride: 105 mmol/L (ref 98–111)
Creatinine, Ser: 0.81 mg/dL (ref 0.61–1.24)
GLUCOSE: 101 mg/dL — AB (ref 70–99)
Potassium: 3.8 mmol/L (ref 3.5–5.1)
Sodium: 140 mmol/L (ref 135–145)
Total Protein: 7.3 g/dL (ref 6.5–8.1)

## 2018-02-02 LAB — URINALYSIS, ROUTINE W REFLEX MICROSCOPIC
BILIRUBIN URINE: NEGATIVE
Bacteria, UA: NONE SEEN
Glucose, UA: NEGATIVE mg/dL
KETONES UR: NEGATIVE mg/dL
Nitrite: NEGATIVE
Protein, ur: NEGATIVE mg/dL
SPECIFIC GRAVITY, URINE: 1.025 (ref 1.005–1.030)
pH: 6 (ref 5.0–8.0)

## 2018-02-02 LAB — LIPASE, BLOOD: Lipase: 33 U/L (ref 11–51)

## 2018-02-02 MED ORDER — IOHEXOL 300 MG/ML  SOLN
100.0000 mL | Freq: Once | INTRAMUSCULAR | Status: AC | PRN
  Administered 2018-02-02: 100 mL via INTRAVENOUS

## 2018-02-02 MED ORDER — OXYCODONE-ACETAMINOPHEN 5-325 MG PO TABS
1.0000 | ORAL_TABLET | Freq: Once | ORAL | Status: AC
  Administered 2018-02-02: 1 via ORAL
  Filled 2018-02-02: qty 1

## 2018-02-02 MED ORDER — SODIUM CHLORIDE 0.9 % IV SOLN
2.0000 g | Freq: Once | INTRAVENOUS | Status: AC
  Administered 2018-02-02: 2 g via INTRAVENOUS
  Filled 2018-02-02: qty 20

## 2018-02-02 MED ORDER — OXYCODONE-ACETAMINOPHEN 5-325 MG PO TABS
1.0000 | ORAL_TABLET | Freq: Once | ORAL | Status: AC
Start: 2018-02-02 — End: 2018-02-02
  Administered 2018-02-02: 1 via ORAL
  Filled 2018-02-02: qty 1

## 2018-02-02 MED ORDER — DOXYCYCLINE HYCLATE 100 MG PO CAPS: 100.0000 mg | ORAL_CAPSULE | Freq: Two times a day (BID) | ORAL | 0 refills | Status: DC

## 2018-02-02 NOTE — Patient Instructions (Signed)
It was great to see you!  Our plans for today:  - Go to the ED to get an ultrasound of your abdomen to check for incarcerated bowel. It is important that you go straight to the ED. - You will likely have a surgeon see you while you are there.  Take care and seek immediate care sooner if you develop any concerns.   Dr. [facility]

## 2018-02-02 NOTE — ED Triage Notes (Signed)
Pt arrived from home after being sent from his PCP for possible Incarcerated bowel. Pt states he has had multiple stomach surgeries. He states that he started having lower abdominal pain on Thursday, and now there is a lump and the area is red and warm.

## 2018-02-02 NOTE — ED Provider Notes (Signed)
[facility] EMERGENCY DEPARTMENT Provider Note   CSN: [id number] Arrival date & time: 02/02/18  1629     History   Chief Complaint Chief Complaint  Patient presents with  . Possible Incarcerated Hernia    HPI Timothy [name] is a 52 y.o. male.  Patient with multiple prior abdominal surgeries presents to the emergency department from his PCP office with concern for possible incarcerated hernia.  He states that he has had a warm palpable mass on his lower abdomen which is been gradually worsening over the past week.  He reports some subjective fevers and chills at home.  States that he has been nauseated, but has not had any vomiting.  He has not taken anything for symptoms.  He states that he has a follow-up appointment with his surgeon tomorrow.  He reports increased pain with palpation.  The history is provided by the patient. No language interpreter was used.    Past Medical History:  Diagnosis Date  . Acute bronchitis 02/15/2013  . Alcohol abuse   . Anxiety   . Bipolar disorder ([place])   . Bronchitis    history  . Bronchitis   . Cellulitis    - left knee-2009  . Chronic pain   . Condyloma 02/04/2012  . Depression   . Diverticulosis    By colonoscopy June 2005  . Dyspnea    on exertion at times  . Elevated CK 3/13  . Emphysema lung ([place])   . Emphysema of lung ([place])   . Family history of anesthesia complication    Mother N/V  . GERD (gastroesophageal reflux disease)   . Glaucoma syndrome    does not use eye drops, "They burn".  [place])    "alot; not daily" (07/23/2012)  . Heavy smoker   . Hemorrhoids   . History of dizziness   . History of stomach ulcers 2005  . HPV (human papilloma virus) infection   . Hypertension    started 3 months ago  . Hypoglycemia   . Incisional hernia   . Lower GI bleed    June 2005. Presumably secondary to diverticulosis.  . Migraines    "not often" (07/23/2012)    Patient Active Problem List   Diagnosis  Date Noted  . Chronic obstructive pulmonary disease ([place]) 07/07/2017  . Mood disorder ([place]) 04/13/2017  . 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[place]) 03/31/2017  . Cocaine use with cocaine-induced mood disorder ([place]) 03/31/2017  . Chronic pain syndrome 02/14/2017  . PUD (peptic ulcer disease) 01/16/2017  . Chronic abdominal wound infection 08/16/2016  . Skin ulcer of abdominal wall ([place]) 12/27/2015  . COPD exacerbation ([place]) 07/12/2015  . Erectile dysfunction 03/14/2014  . Akathisia 03/02/2013  . Ventral hernia 01/11/2013  . Migraine headache 08/23/2012  . Essential hypertension, benign 06/28/2012  . Bipolar disorder ([place]) 06/02/2012  . Insomnia 04/27/2012  . Panic anxiety syndrome 03/21/2012  . History of colostomy 03/21/2012  . History of alcohol abuse 10/29/2011  . History of diverticulitis of colon 10/16/2011  . Tobacco abuse 10/16/2011    Past Surgical History:  Procedure Laterality Date  . ABDOMINAL EXPLORATION SURGERY  07/23/2012   w/LOA (07/23/2012)  . APPENDECTOMY  1982  . COLON RESECTION  02/27/2012   Procedure: COLON RESECTION;  Surgeon: Merrie Roof, MD;  Location: [facility];  Service: General;  Laterality: N/A;  . COLOSTOMY  02/27/2012   Procedure: COLOSTOMY;  Surgeon: Merrie Roof, MD;  Location: [facility];  Service:  General;  Laterality: N/A;  . COLOSTOMY TAKEDOWN  07/23/2012  . COLOSTOMY TAKEDOWN  07/23/2012   Procedure: COLOSTOMY TAKEDOWN;  Surgeon: Merrie Roof, MD;  Location: [facility];  Service: General;  Laterality: N/A;  Primary Anastomosis  . ELBOW FRACTURE SURGERY  ~ 1975   left;  pins inserted   . INSERTION OF MESH N/A 05/20/2013   Procedure: INSERTION OF MESH;  Surgeon: Merrie Roof, MD;  Location: [facility];  Service: General;  Laterality: N/A;  . LAPAROSCOPIC LEFT COLON RESECTION  02/19/2012   SIGMOID  . LAPAROTOMY  02/27/2012   Procedure: EXPLORATORY LAPAROTOMY;  Surgeon: Merrie Roof, MD;  Location: [facility];  Service: General;  Laterality: N/A;  .  LAPAROTOMY  07/23/2012   Procedure: EXPLORATORY LAPAROTOMY;  Surgeon: Merrie Roof, MD;  Location: [facility];  Service: General;  Laterality: N/A;  . LESION DESTRUCTION  07/23/2012   Procedure: DESTRUCTION LESION ANUS;  Surgeon: Merrie Roof, MD;  Location: [facility];  Service: General;  Laterality: N/A;  [place]  . LYSIS OF ADHESION  07/23/2012   Procedure: LYSIS OF ADHESION;  Surgeon: Merrie Roof, MD;  Location: [facility];  Service: General;  Laterality: N/A;  . VENTRAL HERNIA REPAIR  05/20/2013   Dr Marlou Starks  . VENTRAL HERNIA REPAIR N/A 05/20/2013   Procedure: VENTRAL HERNIA REPAIR ;  Surgeon: Merrie Roof, MD;  Location: [facility];  Service: General;  Laterality: N/A;  . WART FULGURATION  02/19/2012   Procedure: FULGURATION ANAL WART;  Surgeon: Merrie Roof, MD;  Location: [facility];  Service: General;  Laterality: N/A;  Destroy  Anal Condyloma   . WISDOM TOOTH EXTRACTION  ~ 1983  . WOUND DEBRIDEMENT N/A 06/03/2016   Procedure: ABDOMINAL WOUND EXPLORATION AND STITCH REMOVAL;  Surgeon: [name], MD;  Location: [facility];  Service: General;  Laterality: N/A;  ABDOMINAL WOUND EXPLORATION AND STITCH REMOVAL        Home Medications    Prior to Admission medications   Medication Sig Start Date End Date Taking? Authorizing Provider  acetaminophen (TYLENOL) 500 MG tablet Take 1,500 mg by mouth 2 (two) times daily as needed for mild pain or headache.    [provider]  albuterol (PROVENTIL HFA;VENTOLIN HFA) 108 (90 Base) MCG/ACT inhaler Inhale 2 puffs into the lungs every 6 (six) hours as needed for wheezing or shortness of breath. 10/27/17   Rogue Bussing, MD  busPIRone (BUSPAR) 15 MG tablet Take 1 tablet (15 mg total) by mouth at bedtime. Per Dr. Tammi Klippel in [facility]. 10/01/17   Rogue Bussing, MD  cyclobenzaprine (FLEXERIL) 10 MG tablet TAKE 1 TABLET BY MOUTH EVERY DAY AS NEEDED FOR MUSCLE SPASMS 01/27/18   Lockamy, Timothy, DO  diazepam  (VALIUM) 5 MG tablet Take 1 tablet (5 mg total) by mouth daily as needed for anxiety or muscle spasms. 01/27/18   Nuala Alpha, DO  doxycycline (VIBRAMYCIN) 100 MG capsule Take 1 capsule (100 mg total) by mouth 2 (two) times daily. 02/02/18   Montine Circle, PA-C  fluticasone (FLOVENT HFA) 44 MCG/ACT inhaler Inhale 1 puff 2 (two) times daily into the lungs. Patient not taking: Reported on 10/02/2017 06/06/17   Rogue Bussing, MD  Fluticasone-Umeclidin-Vilant (TRELEGY ELLIPTA) 100-62.5-25 MCG/INH AEPB Inhale 1 puff into the lungs daily. 12/01/17   Rogue Bussing, MD  levofloxacin (LEVAQUIN) 500 MG tablet Take 1 tablet (500 mg total) by  mouth daily. 12/29/17   Rogue Bussing, MD  lidocaine (LIDODERM) 5 % Place 1 patch onto the skin daily. Remove & Discard patch within 12 hours or as directed by MD Patient not taking: Reported on 10/02/2017 09/01/17   Rogue Bussing, MD  losartan (COZAAR) 100 MG tablet Take 1 tablet (100 mg total) by mouth daily. 08/02/17   Rogue Bussing, MD  lurasidone (LATUDA) 40 MG TABS tablet Take 1 tablet (40 mg total) by mouth daily with breakfast. 10/01/17   Rogue Bussing, MD  oxyCODONE-acetaminophen (PERCOCET) 10-325 MG tablet Take 1 tablet by mouth 2 (two) times daily as needed for pain. 01/27/18   Nuala Alpha, DO  pantoprazole (PROTONIX) 40 MG tablet Take 1 tablet (40 mg total) by mouth 2 (two) times daily. 12/29/17   Rogue Bussing, MD  predniSONE (DELTASONE) 50 MG tablet Take 1 tablet with breakfast daily for the next 5 days. 12/29/17   Rogue Bussing, MD    Family History Family History  Problem Relation Age of Onset  . Diabetes Mother   . Parkinsonism Mother   . Depression Mother   . Alcohol abuse Father   . Hypertension Father   . Anxiety disorder Father   . Ulcers Father   . Colon polyps Father   . Breast cancer Maternal Aunt   . Diabetes Maternal Grandmother   . Stroke Neg Hx   .  Heart disease Neg Hx   . Colon cancer Neg Hx   . Stomach cancer Neg Hx     Social History Social History   Tobacco Use  . Smoking status: Current Every Day Smoker    Packs/day: 2.00    Years: 34.00    Pack years: 68.00    Types: Cigarettes    Start date: 07/22/1981  . Smokeless tobacco: Former Systems developer    Types: Chew    Quit date: 12/12/2010  . Tobacco comment: Current 2 PPD smoker - [place]  Substance Use Topics  . Alcohol use: No    Alcohol/week: 0.0 oz  . Drug use: Yes    Types: Oxycodone     Allergies   Ambien [zolpidem tartrate]; Darvocet [propoxyphene n-acetaminophen]; Morphine and related; Bactroban [mupirocin calcium]; and Lisinopril   Review of Systems Review of Systems  All other systems reviewed and are negative.    Physical Exam Updated Vital Signs BP (!) 157/90 (BP Location: Right Arm)   Pulse 83   Temp 98.6 F (37 C) (Oral)   Resp 16   SpO2 100%   Physical Exam  Constitutional: He is oriented to person, place, and time. He appears well-developed and well-nourished.  HENT:  Head: Normocephalic and atraumatic.  Eyes: Pupils are equal, round, and reactive to light. Conjunctivae and EOM are normal. Right eye exhibits no discharge. Left eye exhibits no discharge. No scleral icterus.  Neck: Normal range of motion. Neck supple. No JVD present.  Cardiovascular: Normal rate, regular rhythm and normal heart sounds. Exam reveals no gallop and no friction rub.  No murmur heard. Pulmonary/Chest: Effort normal and breath sounds normal. No respiratory distress. He has no wheezes. He has no rales. He exhibits no tenderness.  Abdominal: Soft. He exhibits mass. He exhibits no distension. There is tenderness. There is no rebound and no guarding.  Small warm palpable mass to suprapubic region, tender to palpation, no discharge drainage  Musculoskeletal: Normal range of motion. He exhibits no edema or tenderness.  Neurological: He is alert and oriented to person,  place, and time.  Skin: Skin is warm and dry.  Psychiatric: He has a normal mood and affect. His behavior is normal. Judgment and thought content normal.  Nursing note and vitals reviewed.    ED Treatments / Results  Labs (all labs ordered are listed, but only abnormal results are displayed) Labs Reviewed  COMPREHENSIVE METABOLIC PANEL - Abnormal; Notable for the following components:      Result Value   Glucose, Bld 101 (*)    All other components within normal limits  URINALYSIS, ROUTINE W REFLEX MICROSCOPIC - Abnormal; Notable for the following components:   Hgb urine dipstick MODERATE (*)    Leukocytes, UA SMALL (*)    All other components within normal limits  LIPASE, BLOOD  CBC    EKG None  Radiology Ct Abdomen Pelvis W Contrast  Result Date: 02/02/2018 CLINICAL DATA:  Lower abdominal pain for 5 days. Palpable mass with erythema. Evaluate umbilical hernia. History of ventral herniorrhaphy, colon resection, appendectomy. EXAM: CT ABDOMEN AND PELVIS WITH CONTRAST TECHNIQUE: Multidetector CT imaging of the abdomen and pelvis was performed using the standard protocol following bolus administration of intravenous contrast. CONTRAST:  157mL OMNIPAQUE IOHEXOL 300 MG/ML  SOLN COMPARISON:  CT abdomen and pelvis Dec 12, 2016 FINDINGS: LOWER CHEST: Lung bases are clear. Included heart size is normal. No pericardial effusion. HEPATOBILIARY: Liver and gallbladder are normal. PANCREAS: Normal. SPLEEN: Normal. ADRENALS/URINARY TRACT: Kidneys are orthotopic, demonstrating symmetric enhancement. No nephrolithiasis, hydronephrosis or solid renal masses. The unopacified ureters are normal in course and caliber. Delayed imaging through the kidneys demonstrates symmetric prompt contrast excretion within the proximal urinary collecting system. Urinary bladder is well distended, tented toward the anterior abdominal wall. Thickened adrenal gland seen with hyperplasia. STOMACH/BOWEL: Small hiatal hernia.  The stomach, small and large bowel are normal in course and caliber without inflammatory changes. Mild colonic diverticulosis. VASCULAR/LYMPHATIC: Aortoiliac vessels are normal in course and caliber. Moderate calcific atherosclerosis. No lymphadenopathy by CT size criteria. REPRODUCTIVE: Normal. OTHER: No intraperitoneal free fluid or free air. MUSCULOSKELETAL: Increasing soft tissue along anterior abdomen extending through the fascia associated with herniorrhaphy and abdominal scarring. Overlying anterior pelvic wall subcutaneous fat stranding and focal fluid. No subcutaneous gas or radiopaque foreign bodies. Healing new RIGHT posterior eleventh rib fracture. 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 IMPRESSION: 1. Increasing soft tissue anterior abdomen at site of ventral herniorrhaphy may be infectious or inflammatory. This could in part reflect urachal remanent/cyst. Soft tissue extends through the anterior abdominal wall with focal cellulitis versus phlegmon anterior pelvic wall. No hernia. Aortic Atherosclerosis (ICD10-I70.0). Electronically Signed   By: Elon Alas M.D.   On: 02/02/2018 19:24    Procedures Procedures (including critical care time)  Medications Ordered in ED Medications  cefTRIAXone (ROCEPHIN) 2 g in sodium chloride 0.9 % 100 mL IVPB (has no administration in time range)  oxyCODONE-acetaminophen (PERCOCET/ROXICET) 5-325 MG per tablet 1 tablet (1 tablet Oral Given by Other 02/02/18 1750)  iohexol (OMNIPAQUE) 300 MG/ML solution 100 mL (100 mLs Intravenous Contrast Given 02/02/18 1847)  oxyCODONE-acetaminophen (PERCOCET/ROXICET) 5-325 MG per tablet 1 tablet (1 tablet Oral Given 02/02/18 2115)     Initial Impression / Assessment and Plan / ED Course  I have reviewed the triage vital signs and the nursing notes.  Pertinent labs & imaging results that were available during my care of the patient were reviewed by me and  considered in my medical decision making (see chart for details).  She with gradually worsening small palpable mass on lower abdomen.  Sent to the emergency department for CT.  CT shows increased soft tissue swelling, possible cyst and/or abdominal wall/soft tissue cellulitis.  I discussed the case and the CT scan with Dr. Dina Rich, who agrees with plan for consultation with general surgery.  Appreciate Dr. Ninfa Linden for providing advice via telephone.  Recommends 2 g Rocephin.  Discharge home with doxycycline 100 mg twice daily x10 days.  Follow-up in the office tomorrow.  Discussed the plan with the patient, who is agreeable.  Questions answered.  Patient is stable ready for discharge.  Final Clinical Impressions(s) / ED Diagnoses   Final diagnoses:  Abdominal wall cellulitis    ED Discharge Orders        Ordered    doxycycline (VIBRAMYCIN) 100 MG capsule  2 times daily     02/02/18 2345       Montine Circle, PA-C 02/02/18 2351    Horton, Barbette Hair, MD 02/03/18 2354

## 2018-02-02 NOTE — ED Provider Notes (Signed)
Patient placed in Quick Look pathway, seen and evaluated   Chief Complaint: Abdominal mass  HPI:   52 year old male presenting from his PCPs office for possible incarcerated hernia.  He reports a lump to the suprapubic region that began 5 days ago.  Past medical history includes multiple surgeries for hernias.  No fever or chills.  ROS: Abdominal pain and mass (one)  Physical Exam:   Gen: No distress  Neuro: Awake and Alert  Skin: Warm    Focused Exam: Chaperoned exam.  No inguinal hernia palpable bilaterally.  Tachycardic.  There is a hardened mass noted to the suprapubic region with surrounding tenderness to palpation.   Initiation of care has begun. The patient has been counseled on the process, plan, and necessity for staying for the completion/evaluation, and the remainder of the medical screening examination    Joanne Gavel, PA-C 02/02/18 1728    Blanchie Dessert, MD 02/03/18 0040

## 2018-02-02 NOTE — ED Triage Notes (Signed)
Last BM this morning reports its more soft than usual.

## 2018-02-02 NOTE — Progress Notes (Signed)
  Subjective:   Patient ID: Timothy [name]    DOB: 1965/09/23, 52 y.o. male   MRN: [id number]  Cutberto Winfree is a 52 y.o. male with a history of HTN, COPD, PUD, bipolar d/o, tobacco use here for   Red, swollen knot  Patient endorses red, swollen, painful area suprapubically that has been present since Thursday.  He states he noticed pain when he woke up.  He has tried ibuprofen, muscle relaxer, and prescription pain medication that has helped.  He thinks he may have had a fever on Saturday but he did not take his temperature.  He was also nauseous and took Phenergan with improvement.  He has not vomited.  He has noticed more looser bowel movements since Thursday, going 3 times already today.  They are not watery in consistency.  He denies constipation or blood in his stool.  He has had about 6 abdominal surgeries in the past including diverticulitis with colon resection and ostomy with subsequent reversal.  He also has had ventral hernia repair with subsequent mesh repair.  Denies drainage from prior incisions.  Last surgery was November 2016.  Denies new soaps, detergents, or creams.  He has an appointment to see his surgeon for this problem tomorrow.  Review of Systems:  Per HPI.  [place], medications and smoking status reviewed.  Objective:   BP 136/90   Pulse (!) 108   Temp 97.9 F (36.6 C) (Oral)   Ht 5\' 11"  (1.803 m)   Wt 168 lb 9.6 oz (76.5 kg)   SpO2 99%   BMI 23.51 kg/m  Vitals and nursing note reviewed.  General: well nourished, well developed, in no acute distress with non-toxic appearance CV: regular rhythm, slightly tachycardic without murmurs, rubs, or gallops Lungs: clear to auscultation bilaterally with normal work of breathing Abdomen: soft, non-distended, no organomegaly palpable, normoactive bowel sounds.  Well-healed midline surgical scar present.  Erythematous, warm, firm and raised area ~2 inch in diameter present suprapubically at the bottom of previous surgical scar  located midline, extremely tender to palpation, no change in size with positional changes. MSK: ROM grossly intact, strength intact, gait normal Neuro: Alert and oriented, speech normal  Assessment & Plan:   Induration of skin Concern for incarcerated bowel within hernia given history of multiple abdominal surgeries and ventral hernia s/p repair especially with recent bowel changes.  Although he is slightly tachycardic, no other findings to suggest systemic infection as he is afebrile and other vitals within normal limits.  Could also be localized superficial infection given erythema, warmth and induration.  Contacted surgical office to see if he could be evaluated today however recommended he be seen in the ED for ultrasound to rule out bowel incarceration and possible surgical consultation.  Instructions given to patient to go immediately to the ED for further evaluation.  Patient verbalized understanding.  No orders of the defined types were placed in this encounter.  No orders of the defined types were placed in this encounter.  Precepted with Dr. Walker Kehr.  Rory Percy, DO PGY-2, [facility] 02/03/2018 12:16 PM

## 2018-02-03 ENCOUNTER — Encounter: Payer: Self-pay | Admitting: Family Medicine

## 2018-02-03 DIAGNOSIS — R234 Changes in skin texture: Secondary | ICD-10-CM | POA: Insufficient documentation

## 2018-02-03 NOTE — Assessment & Plan Note (Signed)
Concern for incarcerated bowel within hernia given history of multiple abdominal surgeries and ventral hernia s/p repair especially with recent bowel changes.  Although he is slightly tachycardic, no other findings to suggest systemic infection as he is afebrile and other vitals within normal limits.  Could also be localized superficial infection given erythema, warmth and induration.  Contacted surgical office to see if he could be evaluated today however recommended he be seen in the ED for ultrasound to rule out bowel incarceration and possible surgical consultation.  Instructions given to patient to go immediately to the ED for further evaluation.  Patient verbalized understanding.

## 2018-02-26 ENCOUNTER — Ambulatory Visit (INDEPENDENT_AMBULATORY_CARE_PROVIDER_SITE_OTHER): Payer: Medicaid Other | Admitting: Family Medicine

## 2018-02-26 VITALS — BP 130/80 | HR 93 | Temp 98.3°F | Wt 169.2 lb

## 2018-02-26 DIAGNOSIS — IMO0002 Reserved for concepts with insufficient information to code with codable children: Secondary | ICD-10-CM

## 2018-02-26 DIAGNOSIS — G894 Chronic pain syndrome: Secondary | ICD-10-CM

## 2018-02-26 DIAGNOSIS — K668 Other specified disorders of peritoneum: Secondary | ICD-10-CM | POA: Diagnosis not present

## 2018-02-26 MED ORDER — OXYCODONE-ACETAMINOPHEN 10-325 MG PO TABS: 1.0000 | ORAL_TABLET | Freq: Two times a day (BID) | ORAL | 0 refills | Status: DC | PRN

## 2018-02-26 MED ORDER — DIAZEPAM 5 MG PO TABS: 5.0000 mg | ORAL_TABLET | Freq: Every day | ORAL | 0 refills | Status: DC | PRN

## 2018-02-26 NOTE — Progress Notes (Signed)
Subjective: Chief Complaint  Patient presents with  . Medication Refill  . groin cyst    recurrent    HPI: Timothy [name] is a 52 y.o. presenting to clinic today to discuss the following:  Chronic Pain Refill Patient is presenting for a refill of his chronic pain medications. He states lately his pain has been worse as he has been dealing with a groin cyst that he was seen and evaluated for in the ED. He was able to get through this month on his regular number of pain pills. He is not requesting more and states his pain is well controlled. We discussed not decreasing his number of pills given his acute issues.   I am decreasing his benzo by 10% this month. Patient expressed understanding and agreement.  Adominal Cyst Improved. Still somewhat tender to palpation but denies fever, chills, nausea, vomiting, or increasing redness or swelling. TTP but patient states it has improved a lot since getting antibiotic in the ED. He also states pain has greatly improved. There was concern for hernia that became incarcerated but CT scan showed no hernia and likely abdominal cyst.  Health Maintenance: None     ROS noted in HPI.   Past Medical, Surgical, Social, and Family History Reviewed & Updated per EMR.   Pertinent Historical Findings include:   Social History   Tobacco Use  Smoking Status Current Every Day Smoker  . Packs/day: 2.00  . Years: 34.00  . Pack years: 68.00  . Types: Cigarettes  . Start date: 07/22/1981  Smokeless Tobacco Former Systems developer  . Types: Chew  . Quit date: 12/12/2010  Tobacco Comment   Current 2 PPD smoker - [place]    Objective: BP 130/80 (BP Location: Right Arm, Patient Position: Sitting, Cuff Size: Normal)   Pulse 93   Temp 98.3 F (36.8 C) (Oral)   Wt 169 lb 3.2 oz (76.7 kg)   SpO2 97%   BMI 23.60 kg/m  Vitals and nursing notes reviewed  Physical Exam Gen: Alert and Oriented x 3, NAD HEENT: Normocephalic, atraumatic, PERRLA, EOMI CV: RRR,  no murmurs, normal S1, S2 split, +2 pulses dorsalis pedis bilaterally Resp: CTAB, no wheezing, rales, or rhonchi, comfortable work of breathing Abd: non-distended, soft, +bs in all four quadrants, no hepatomegaly, lower midline abdominal has area measuring 20cmx10cm that is swollen with no erythema or warmth. Ext: no clubbing, cyanosis, or edema Skin: warm, dry, intact, no rashes Psych: appropriate behavior, mood,   No results found for this or any previous visit (from the past 72 hour(s)).  Assessment/Plan:  Chronic pain syndrome Refilled 10-325 percocet #22, will reduce by 10% at next visit Reduced benzodiazepine by 10% today. Pain contract signed, no red flags today.  Could not give UDS today b/c he states he would test positive for cocaine. UDS at next visit.     Abdominal cyst Given no fever, worsening swelling, or erythema it appears that cyst is responding to antibiotics. Patient was given Ceftriaxone in ED and is picking up Doxycycline today to take for 10 days. Patient understands return precautions.   PATIENT EDUCATION PROVIDED: See AVS    Diagnosis and plan along with any newly prescribed medication(s) were discussed in detail with this patient today. The patient verbalized understanding and agreed with the plan. Patient advised if symptoms worsen return to clinic or ER.   Health Maintainance:   No orders of the defined types were placed in this encounter.   Meds ordered this encounter  Medications  . DISCONTD: oxyCODONE-acetaminophen (PERCOCET) 10-325 MG tablet    Sig: Take 1 tablet by mouth 2 (two) times daily as needed for pain.    Dispense:  22 tablet    Refill:  0  . oxyCODONE-acetaminophen (PERCOCET) 10-325 MG tablet    Sig: Take 1 tablet by mouth 2 (two) times daily as needed for pain.    Dispense:  22 tablet    Refill:  0  . diazepam (VALIUM) 5 MG tablet    Sig: Take 1 tablet (5 mg total) by mouth daily as needed for anxiety or muscle spasms.     Dispense:  23 tablet    Refill:  0    Harolyn Rutherford, DO 03/03/2018, 1:54 PM PGY-2 [place]

## 2018-02-26 NOTE — Patient Instructions (Addendum)
It was great to see you today! Thank you for letting me participate in your care!  Today, we discussed your chronic pain and your groin cyst. I have refilled your Valium and your Percocet. I have decreased your Valium to 23 pills for the month instead of 25. I made no changes to your Percocet today. We will wean it next time. We will also get a urine drug screen at your next appointment.  If you have no improvement with the cyst in 5 days please call the office and return for an appointment. If you develop fever, chills, increased pain, swelling, or redness in or around the site please seek immediate medical care.  Please return in one month.  Be well, Harolyn Rutherford, DO PGY-2, [name]

## 2018-03-03 NOTE — Assessment & Plan Note (Addendum)
Given no fever, worsening swelling, or erythema it appears that cyst is responding to antibiotics. Patient was given Ceftriaxone in ED and is picking up Doxycycline today to take for 10 days. Patient understands return precautions.

## 2018-03-03 NOTE — Assessment & Plan Note (Signed)
Refilled 10-325 percocet #22, will reduce by 10% at next visit Reduced benzodiazepine by 10% today. Pain contract signed, no red flags today.  Could not give UDS today b/c he states he would test positive for cocaine. UDS at next visit.

## 2018-03-18 ENCOUNTER — Other Ambulatory Visit: Payer: Self-pay | Admitting: Family Medicine

## 2018-03-27 ENCOUNTER — Ambulatory Visit: Payer: Self-pay | Admitting: Family Medicine

## 2018-04-15 ENCOUNTER — Other Ambulatory Visit: Payer: Self-pay

## 2018-04-15 ENCOUNTER — Encounter: Payer: Self-pay | Admitting: Family Medicine

## 2018-04-15 ENCOUNTER — Ambulatory Visit (INDEPENDENT_AMBULATORY_CARE_PROVIDER_SITE_OTHER): Payer: Medicaid Other | Admitting: Family Medicine

## 2018-04-15 VITALS — BP 144/90 | HR 56 | Temp 97.8°F | Wt 167.6 lb

## 2018-04-15 DIAGNOSIS — G894 Chronic pain syndrome: Secondary | ICD-10-CM

## 2018-04-15 DIAGNOSIS — R1084 Generalized abdominal pain: Secondary | ICD-10-CM | POA: Diagnosis not present

## 2018-04-15 DIAGNOSIS — Z79899 Other long term (current) drug therapy: Secondary | ICD-10-CM

## 2018-04-15 DIAGNOSIS — K219 Gastro-esophageal reflux disease without esophagitis: Secondary | ICD-10-CM

## 2018-04-15 DIAGNOSIS — R112 Nausea with vomiting, unspecified: Secondary | ICD-10-CM

## 2018-04-15 MED ORDER — OXYCODONE-ACETAMINOPHEN 10-325 MG PO TABS: 1.0000 | ORAL_TABLET | Freq: Two times a day (BID) | ORAL | 0 refills | Status: DC | PRN

## 2018-04-15 MED ORDER — PANTOPRAZOLE SODIUM 40 MG PO TBEC: 40.0000 mg | DELAYED_RELEASE_TABLET | Freq: Two times a day (BID) | ORAL | 3 refills | Status: DC

## 2018-04-15 MED ORDER — DIAZEPAM 5 MG PO TABS: 5.0000 mg | ORAL_TABLET | Freq: Every day | ORAL | 0 refills | Status: DC | PRN

## 2018-04-15 MED ORDER — CYCLOBENZAPRINE HCL 10 MG PO TABS: ORAL_TABLET | ORAL | 2 refills | Status: DC

## 2018-04-15 NOTE — Patient Instructions (Signed)
It was great to meet you today! Thank you for letting me participate in your care!  Today, we discussed your continued management for your chronic pain and anxiety. I did NOT decreased your diazepam today. I did decrease your oxycodone total amount by 2 pills this time.  For your toe pain please change your work shoes and wait 3-4 days. If it is not improved please call the clinic and I will arrange for x-rays to be taken. If it swells, turns red, gets hot, or worsens return to the clinic as soon as possible.  Be well, Harolyn Rutherford, DO PGY-2, [name]

## 2018-04-15 NOTE — Progress Notes (Signed)
Subjective: Chief Complaint  Patient presents with  . Follow-up    HPI: Timothy [name] is a 52 y.o. presenting to clinic today to discuss the following:  Chronic Pain Patient consents to urine drug screen today. Denies any substance abuse since last appointment. Refilling oxycodone and diazepam today. Patient is agreeable to decreasing oxycodone down by 2 pills for this month.  Left 5th metatarsal pain Patient endorses pain on the dorsum of the foot at the base of the 5th metatarsal. Pain is described as "sharp". He denies any fall or injury to the area and states it is worse with pressure and walking. He states the pain comes and goes but does get intense when it comes. He in only tender in that particular area and it does not radiate. He has not noticed any swelling, rash, or change in skin color to his foot.  Health Maintenance: none today     ROS noted in HPI.   Past Medical, Surgical, Social, and Family History Reviewed & Updated per EMR.   Pertinent Historical Findings include:   Social History   Tobacco Use  Smoking Status Current Every Day Smoker  . Packs/day: 2.00  . Years: 34.00  . Pack years: 68.00  . Types: Cigarettes  . Start date: 07/22/1981  Smokeless Tobacco Former Systems developer  . Types: Chew  . Quit date: 12/12/2010  Tobacco Comment   Current 2 PPD smoker - [place]      Objective: BP (!) 144/90 (BP Location: Right Arm)   Pulse (!) 56   Temp 97.8 F (36.6 C) (Oral)   Wt 167 lb 9.6 oz (76 kg)   SpO2 97%   BMI 23.38 kg/m  Vitals and nursing notes reviewed  Physical Exam Gen: Alert and Oriented x 3, NAD HEENT: Normocephalic, atraumatic MSK: Moves all four extremities; base of left 5th metatarsal is TTP. Pain elicited on flexion of his toes. Ext: no clubbing, cyanosis, or edema Neuro: No gross deficits Skin: warm, dry, intact, no rashes; no erythema, swelling, or heat at the 5th metatarsal on the left foot.  No results found for this or any  previous visit (from the past 72 hour(s)).  Assessment/Plan:  Chronic pain syndrome Reduced percocet to #20 at this visit and refilled. Refilled benzodiazepine with no changes. Pain contract signed, no red flags today.  UDS obtained at today's visit.   PATIENT EDUCATION PROVIDED: See AVS    Diagnosis and plan along with any newly prescribed medication(s) were discussed in detail with this patient today. The patient verbalized understanding and agreed with the plan. Patient advised if symptoms worsen return to clinic or ER.   Health Maintainance:   Orders Placed This Encounter  Procedures  . ToxASSURE Select 13 (MW), Urine  . Drug Screen, Urine    Meds ordered this encounter  Medications  . oxyCODONE-acetaminophen (PERCOCET) 10-325 MG tablet    Sig: Take 1 tablet by mouth 2 (two) times daily as needed for pain.    Dispense:  20 tablet    Refill:  0  . diazepam (VALIUM) 5 MG tablet    Sig: Take 1 tablet (5 mg total) by mouth daily as needed for anxiety or muscle spasms.    Dispense:  23 tablet    Refill:  0  . cyclobenzaprine (FLEXERIL) 10 MG tablet    Sig: TAKE 1 TABLET BY MOUTH EVERY DAY AS NEEDED FOR MUSCLE SPASMS    Dispense:  30 tablet    Refill:  2  Harolyn Rutherford, DO 04/15/2018, 2:12 PM PGY-2 [place]

## 2018-04-16 NOTE — Assessment & Plan Note (Signed)
Reduced percocet to #20 at this visit and refilled. Refilled benzodiazepine with no changes. Pain contract signed, no red flags today.  UDS obtained at today's visit.

## 2018-04-21 LAB — TOXASSURE SELECT 13 (MW), URINE

## 2018-05-05 ENCOUNTER — Other Ambulatory Visit: Payer: Self-pay | Admitting: Family Medicine

## 2018-05-19 ENCOUNTER — Other Ambulatory Visit: Payer: Self-pay

## 2018-05-19 ENCOUNTER — Ambulatory Visit (INDEPENDENT_AMBULATORY_CARE_PROVIDER_SITE_OTHER): Payer: Medicaid Other | Admitting: Family Medicine

## 2018-05-19 ENCOUNTER — Telehealth: Payer: Self-pay

## 2018-05-19 ENCOUNTER — Encounter: Payer: Self-pay | Admitting: Family Medicine

## 2018-05-19 ENCOUNTER — Ambulatory Visit (HOSPITAL_COMMUNITY)
Admission: RE | Admit: 2018-05-19 | Discharge: 2018-05-19 | Disposition: A | Discharge status: Home / Self Care | Payer: Medicaid Other | Source: Ambulatory Visit | Attending: Family Medicine | Admitting: Family Medicine

## 2018-05-19 VITALS — BP 134/82 | HR 100 | Temp 97.5°F | Ht 71.0 in | Wt 169.4 lb

## 2018-05-19 DIAGNOSIS — J441 Chronic obstructive pulmonary disease with (acute) exacerbation: Secondary | ICD-10-CM

## 2018-05-19 DIAGNOSIS — R112 Nausea with vomiting, unspecified: Secondary | ICD-10-CM

## 2018-05-19 DIAGNOSIS — R079 Chest pain, unspecified: Secondary | ICD-10-CM | POA: Insufficient documentation

## 2018-05-19 DIAGNOSIS — R062 Wheezing: Secondary | ICD-10-CM

## 2018-05-19 DIAGNOSIS — R6889 Other general symptoms and signs: Secondary | ICD-10-CM | POA: Diagnosis not present

## 2018-05-19 DIAGNOSIS — R1084 Generalized abdominal pain: Secondary | ICD-10-CM

## 2018-05-19 DIAGNOSIS — F17210 Nicotine dependence, cigarettes, uncomplicated: Secondary | ICD-10-CM | POA: Diagnosis not present

## 2018-05-19 DIAGNOSIS — K219 Gastro-esophageal reflux disease without esophagitis: Secondary | ICD-10-CM | POA: Diagnosis not present

## 2018-05-19 DIAGNOSIS — G894 Chronic pain syndrome: Secondary | ICD-10-CM

## 2018-05-19 LAB — POCT INFLUENZA A/B
INFLUENZA A, POC: NEGATIVE
INFLUENZA B, POC: NEGATIVE

## 2018-05-19 MED ORDER — PANTOPRAZOLE SODIUM 40 MG PO TBEC: 40.0000 mg | DELAYED_RELEASE_TABLET | Freq: Two times a day (BID) | ORAL | 3 refills | Status: DC

## 2018-05-19 MED ORDER — OXYCODONE-ACETAMINOPHEN 10-325 MG PO TABS: 1.0000 | ORAL_TABLET | Freq: Two times a day (BID) | ORAL | 0 refills | Status: DC | PRN

## 2018-05-19 MED ORDER — IPRATROPIUM BROMIDE 0.02 % IN SOLN
0.5000 mg | Freq: Once | RESPIRATORY_TRACT | Status: AC
  Administered 2018-05-19: 0.5 mg via RESPIRATORY_TRACT

## 2018-05-19 MED ORDER — ALBUTEROL SULFATE (2.5 MG/3ML) 0.083% IN NEBU
2.5000 mg | INHALATION_SOLUTION | Freq: Once | RESPIRATORY_TRACT | Status: AC
  Administered 2018-05-19: 2.5 mg via RESPIRATORY_TRACT

## 2018-05-19 MED ORDER — CYCLOBENZAPRINE HCL 10 MG PO TABS: ORAL_TABLET | ORAL | 2 refills | Status: DC

## 2018-05-19 MED ORDER — BUSPIRONE HCL 15 MG PO TABS: 15.0000 mg | ORAL_TABLET | Freq: Every day | ORAL | 3 refills | Status: DC

## 2018-05-19 MED ORDER — DIAZEPAM 5 MG PO TABS: 5.0000 mg | ORAL_TABLET | Freq: Every day | ORAL | 0 refills | Status: DC | PRN

## 2018-05-19 MED ORDER — AZITHROMYCIN 250 MG PO TABS: ORAL_TABLET | ORAL | 0 refills | Status: AC

## 2018-05-19 NOTE — Assessment & Plan Note (Signed)
Most likely pleuritic chest pain from COPD exacerbation and chronic cough. EKG was normal sinus rhythm with no ST segment changes and no T-wave abnormalities.

## 2018-05-19 NOTE — Progress Notes (Signed)
Subjective: Chief Complaint  Patient presents with  . Follow-up    HPI: Timothy [name] is a 52 y.o. presenting to clinic today to discuss the following:  Pain Management Despite his current difficulty breathing from a pain perspective patient is doing "ok". He feels bad but this is an acute problem noted in more detail below. Will decrease Valium by 1 today. PEG Questions 1) 7 2) 5.5 3) 5.5  Chest Pain with Cough Patient states since "Saturday or Sunday" he has had a productive cough with brownish-greenish sputum with chest pain/pressure that is non-radiating and located on the right side of his chest that is worse on exertion or coughing with associated dizziness. He is also endorsing body aches, subjective fever, SOB, diaphoresis, and fatigue. He denies left sided chest pain and describes it as not always worse on exertion. More when he coughs so sounds somewhat pleuritic. Patient is concerned he may have the flu. Patient has not been taking Trelegy daily as he says it "doesn't do anything". Instructed patient it will be important to restart this medication to prevent future COPD attacks. He expressed understanding and states he has some at home.  Health Maintenance: patient refuses influenza vaccine     ROS noted in HPI.   Past Medical, Surgical, Social, and Family History Reviewed & Updated per EMR.   Pertinent Historical Findings include:   Social History   Tobacco Use  Smoking Status Current Every Day Smoker  . Packs/day: 2.00  . Years: 34.00  . Pack years: 68.00  . Types: Cigarettes  . Start date: 07/22/1981  Smokeless Tobacco Former Systems developer  . Types: Chew  . Quit date: 12/12/2010  Tobacco Comment   Current 2 PPD smoker - [place]    Objective: BP 134/82   Pulse 100   Temp (!) 97.5 F (36.4 C) (Oral)   Ht 5\' 11"  (1.803 m)   Wt 169 lb 6.4 oz (76.8 kg)   SpO2 99%   BMI 23.63 kg/m  Vitals and nursing notes reviewed  Physical Exam Gen: Alert and  Oriented x 3, NAD HEENT: Normocephalic, atraumatic, PERRLA, EOMI, TM visible with good light reflex, non-swollen, non-erythematous turbinates, non-erythematous pharyngeal mucosa, no exudates CV: RRR, no murmurs, normal S1, S2 split Resp: Decreased air sounds throughout all lung fields with diffuse wheezing, no rales, or rhonchi, NWOB Ext: no clubbing, cyanosis, or edema Skin: warm, dry, intact, no rashes  EKG: Normal sinus rhythm with possible left atrial enlargement and right axis deviation. No ST segment changes, no T-wave abnormalities.  Results for orders placed or performed in visit on 05/19/18 (from the past 72 hour(s))  Influenza A/B     Status: None   Collection Time: 05/19/18  2:20 PM  Result Value Ref Range   Influenza A, POC Negative Negative   Influenza B, POC Negative Negative    Assessment/Plan:  No problem-specific Assessment & Plan notes found for this encounter.   PATIENT EDUCATION PROVIDED: See AVS    Diagnosis and plan along with any newly prescribed medication(s) were discussed in detail with this patient today. The patient verbalized understanding and agreed with the plan. Patient advised if symptoms worsen return to clinic or ER.   Health Maintainance:   Orders Placed This Encounter  Procedures  . CBC w/Diff  . Influenza A/B  . EKG 12-Lead    Meds ordered this encounter  Medications  . busPIRone (BUSPAR) 15 MG tablet    Sig: Take 1 tablet (15 mg total)  by mouth at bedtime.    Dispense:  90 tablet    Refill:  3  . cyclobenzaprine (FLEXERIL) 10 MG tablet    Sig: TAKE 1 TABLET BY MOUTH EVERY DAY AS NEEDED FOR MUSCLE SPASMS    Dispense:  30 tablet    Refill:  2  . DISCONTD: diazepam (VALIUM) 5 MG tablet    Sig: Take 1 tablet (5 mg total) by mouth daily as needed for anxiety or muscle spasms.    Dispense:  23 tablet    Refill:  0  . DISCONTD: oxyCODONE-acetaminophen (PERCOCET) 10-325 MG tablet    Sig: Take 1 tablet by mouth 2 (two) times daily as  needed for pain.    Dispense:  20 tablet    Refill:  0  . azithromycin (ZITHROMAX) 250 MG tablet    Sig: Please take 2 tablets on day one, then one tablet on days 2-5.    Dispense:  6 tablet    Refill:  0  . pantoprazole (PROTONIX) 40 MG tablet    Sig: Take 1 tablet (40 mg total) by mouth 2 (two) times daily.    Dispense:  60 tablet    Refill:  3  . diazepam (VALIUM) 5 MG tablet    Sig: Take 1 tablet (5 mg total) by mouth daily as needed for anxiety or muscle spasms.    Dispense:  22 tablet    Refill:  0  . oxyCODONE-acetaminophen (PERCOCET) 10-325 MG tablet    Sig: Take 1 tablet by mouth 2 (two) times daily as needed for pain.    Dispense:  20 tablet    Refill:  0     Harolyn Rutherford, DO 05/19/2018, 1:48 PM PGY-2 [place]

## 2018-05-19 NOTE — Patient Instructions (Signed)
It was great to see you today! Thank you for letting me participate in your care!  Today, we discussed continued management of your chronic pain. I did decrease your overall Valium number down one. I kept percocet the same. I gave you printed copies of both prescriptions for Valium and Percocet. I think you are making great progress in managing your chronic pain. Let's continue to wean down these medications. I would like to eventually take you off Valium completely as it has dangerous side effects especially when used in conjunction with opioids.  For your cough and chest pain I believe you are having a COPD exacerbation. I have sent the following medications to the pharmacy. Please pick them up and use them as prescribed. I will let you know if you your lab result is abnormal. I think you will improve greatly and have less attacks if you use Trelegy on a regular basis. It is very important to take this medication EVERYDAY. Please make sure you take it once a day over the next 5 days especially. The Prednisone will also help control your cough.  Azithromycin (Z-pack) Prednisone Flexeril Buspar Pantoprazole    Please pick up the following medications   Be well, Harolyn Rutherford, DO PGY-2, [name]

## 2018-05-19 NOTE — Assessment & Plan Note (Signed)
Most likely COPD exacerbation given patient wheezing on exam, SOB, and improvement with duonebs given here in clinic. No need for hospitalization given stable vitals and no fever. Will follow up on WBC count. Influenza testing negative. - Prescribed Z-pack 500mg  for day one, then 250 for four days - Prednisone 50mg  x 5 days - Patient instructed to take Trelegy one puff daily over the next 5 days. Also educated about the importance of taking it daily in the future to prevent future exacerbations.

## 2018-05-19 NOTE — Telephone Encounter (Signed)
Completed PA info in [place] for Oxycodone/Acet.  Status pending.  Will recheck status in 24 hours. Esau Grew, RN

## 2018-05-19 NOTE — Assessment & Plan Note (Addendum)
Stable.   Refilled percocet to #20 at this visit. Reduced benzodiazepine to #22 and refilled at this visit. Pain contract signed, no red flags today.  No UDS at this visit.

## 2018-05-20 LAB — CBC WITH DIFFERENTIAL/PLATELET
BASOS ABS: 0.1 10*3/uL (ref 0.0–0.2)
Basos: 1 %
EOS (ABSOLUTE): 0.1 10*3/uL (ref 0.0–0.4)
Eos: 1 %
HEMATOCRIT: 43.8 % (ref 37.5–51.0)
Hemoglobin: 14.6 g/dL (ref 13.0–17.7)
Immature Grans (Abs): 0 10*3/uL (ref 0.0–0.1)
Immature Granulocytes: 0 %
LYMPHS ABS: 2.3 10*3/uL (ref 0.7–3.1)
Lymphs: 21 %
MCH: 30.6 pg (ref 26.6–33.0)
MCHC: 33.3 g/dL (ref 31.5–35.7)
MCV: 92 fL (ref 79–97)
MONOS ABS: 0.8 10*3/uL (ref 0.1–0.9)
Monocytes: 7 %
NEUTROS ABS: 7.6 10*3/uL — AB (ref 1.4–7.0)
Neutrophils: 70 %
Platelets: 426 10*3/uL (ref 150–450)
RBC: 4.77 x10E6/uL (ref 4.14–5.80)
RDW: 12.5 % (ref 12.3–15.4)
WBC: 10.8 10*3/uL (ref 3.4–10.8)

## 2018-05-20 NOTE — Telephone Encounter (Signed)
Approved via [place].  LMOVM of pharmacy. Rae Sutcliffe, Salome Spotted, CMA

## 2018-06-11 ENCOUNTER — Encounter: Payer: Self-pay | Admitting: Family Medicine

## 2018-06-11 ENCOUNTER — Ambulatory Visit (INDEPENDENT_AMBULATORY_CARE_PROVIDER_SITE_OTHER): Payer: Medicaid Other | Admitting: Family Medicine

## 2018-06-11 VITALS — BP 138/84 | HR 111 | Temp 98.1°F | Wt 172.0 lb

## 2018-06-11 DIAGNOSIS — Z760 Encounter for issue of repeat prescription: Secondary | ICD-10-CM

## 2018-06-11 DIAGNOSIS — G894 Chronic pain syndrome: Secondary | ICD-10-CM | POA: Diagnosis not present

## 2018-06-11 MED ORDER — OXYCODONE-ACETAMINOPHEN 10-325 MG PO TABS: 1.0000 | ORAL_TABLET | Freq: Two times a day (BID) | ORAL | 0 refills | Status: DC | PRN

## 2018-06-11 MED ORDER — DIAZEPAM 5 MG PO TABS: 5.0000 mg | ORAL_TABLET | Freq: Every day | ORAL | 0 refills | Status: DC | PRN

## 2018-06-11 NOTE — Progress Notes (Signed)
   Subjective:   Patient ID: Timothy [name]    DOB: [birth year], 52 y.o. male   MRN: [id number]  CC: chronic pain, med refill   HPI: Timothy [name] is a 52 y.o. male who presents to clinic today for the following issue.   Chronic pain History of six stomach surgeries.  Sees Dr. Garlan Fillers for pain control and has been managing this with flexeril and oxycodone.  He reports he also takes diazepam daily for anxiety and has been weaning down on it.  Reduced to 22 tablets at last visit. He would like refill of his chronic pain meds as appointment with PCP unavailable until next month.  No fever, chills, nausea, vomiting.  No diarrhea.  No chest pain, shortness of breath, palpitations.  No new or worsening symptoms.  No other concerns at this time.   Health maintenance:  -due for flu shot, pt declined  ROS: Per HPI.   Social: pt is a current smoker.  Medications reviewed. Objective:   BP 138/84   Pulse (!) 111   Temp 98.1 F (36.7 C) (Oral)   Wt 172 lb (78 kg)   SpO2 98%   BMI 23.99 kg/m  Vitals and nursing note reviewed.  General: pleasant 52 yo male, NAD  Neck: supple  CV: RRR no MRG  Lungs: diffuse wheezing bilaterally  Abdomen: soft, NTND, +bs  Skin: warm, dry, no rash  Extremities: warm and well perfused  Neuro: alert, oriented x3, no focal deficits   Assessment & Plan:   Chronic pain syndrome Stable, no symptoms or new worsening.  Refill percocet, 20 tabs  Diazepam 5 mg, 22 tabs  Pain contract signed at last visit.  States he does not need flexeril refill as he has 2 refills remaining from last month's rx.  Follow up with PCP in 1 month.   Health maintenance:  -pt declines flu shot   Meds ordered this encounter  Medications  . diazepam (VALIUM) 5 MG tablet    Sig: Take 1 tablet (5 mg total) by mouth daily as needed for anxiety or muscle spasms.    Dispense:  22 tablet    Refill:  0  . oxyCODONE-acetaminophen (PERCOCET) 10-325 MG tablet    Sig: Take 1 tablet by  mouth 2 (two) times daily as needed for pain.    Dispense:  20 tablet    Refill:  0   Follow up: 1 month   Lovenia Kim, MD [place]

## 2018-06-11 NOTE — Assessment & Plan Note (Signed)
Stable, no symptoms or new worsening.  Refill percocet, 20 tabs  Diazepam 5 mg, 22 tabs  Pain contract signed at last visit.  States he does not need flexeril refill as he has 2 refills remaining from last month's rx.  Follow up with PCP in 1 month.

## 2018-06-11 NOTE — Patient Instructions (Signed)
It was nice meeting you today.  You were seen in clinic for follow-up of your chronic pain.  I have refilled your Percocet and diazepam.  Please make an appointment in 1 month to follow-up with your PCP.  Lovenia Kim MD

## 2018-06-26 ENCOUNTER — Other Ambulatory Visit: Payer: Self-pay | Admitting: Internal Medicine

## 2018-07-03 ENCOUNTER — Encounter: Payer: Self-pay | Admitting: Family Medicine

## 2018-07-03 ENCOUNTER — Ambulatory Visit (INDEPENDENT_AMBULATORY_CARE_PROVIDER_SITE_OTHER): Payer: Self-pay | Admitting: Family Medicine

## 2018-07-03 VITALS — BP 130/80 | HR 92 | Temp 98.0°F | Wt 174.2 lb

## 2018-07-03 DIAGNOSIS — R058 Other specified cough: Secondary | ICD-10-CM

## 2018-07-03 DIAGNOSIS — R05 Cough: Secondary | ICD-10-CM

## 2018-07-03 DIAGNOSIS — J441 Chronic obstructive pulmonary disease with (acute) exacerbation: Secondary | ICD-10-CM

## 2018-07-03 MED ORDER — AZITHROMYCIN 250 MG PO TABS: ORAL_TABLET | ORAL | 0 refills | Status: DC

## 2018-07-03 MED ORDER — PREDNISONE 20 MG PO TABS: 40.0000 mg | ORAL_TABLET | Freq: Every day | ORAL | 0 refills | Status: AC

## 2018-07-03 NOTE — Progress Notes (Signed)
Ambulatory Pulse ox 97 HR 107    .Ozella Almond, CMA

## 2018-07-03 NOTE — Patient Instructions (Addendum)
Chronic Obstructive Pulmonary Disease Exacerbation Chronic obstructive pulmonary disease (COPD) is a common lung problem. In COPD, the flow of air from the lungs is limited. COPD exacerbations are times that breathing gets worse and you need extra treatment. Without treatment they can be life threatening. If they happen often, your lungs can become more damaged. If your COPD gets worse, your doctor may treat you with:  Medicines.  Oxygen.  Different ways to clear your airway, such as using a mask.  Follow these instructions at home:  Do not smoke.  Avoid tobacco smoke and other things that bother your lungs.  If given, take your antibiotic medicine as told. Finish the medicine even if you start to feel better.  Only take medicines as told by your doctor.  Drink enough fluids to keep your pee (urine) clear or pale yellow (unless your doctor has told you not to).  Use a cool mist machine (vaporizer).  If you use oxygen or a machine that turns liquid medicine into a mist (nebulizer), continue to use them as told.  Keep up with shots (vaccinations) as told by your doctor.  Exercise regularly.  Eat healthy foods.  Keep all doctor visits as told. Get help right away if:  You are very short of breath and it gets worse.  You have trouble talking.  You have bad chest pain.  You have blood in your spit (sputum).  You have a fever.  You keep throwing up (vomiting).  You feel weak, or you pass out (faint).  You feel confused.  You keep getting worse. This information is not intended to replace advice given to you by your health care provider. Make sure you discuss any questions you have with your health care provider. Document Released: 06/27/2011 Document Revised: 12/14/2015 Document Reviewed: 03/12/2013 Elsevier Interactive Patient Education  2017 [place].  Please use prednisone x5 days and azithromycin x 5 days. Please follow up in 1-2 weeks. If worsening come back  sooner or go to the emergency department.

## 2018-07-03 NOTE — Progress Notes (Signed)
   Subjective:    Patient ID: Timothy [name], male    DOB: April 16, 1966, 52 y.o.   MRN: [id number]   CC: worsening cough  HPI: Worsening cough Patient presenting today for worsening cough.  States that Wednesday morning he began to feel ill.  It became very bad yesterday.  States he feels slightly better today but still has some cough.  Reports some sore throat as well, increased pain with swallowing especially in his right ear.  Cough produces brown/green mucus.  Patient also had pounding headache yesterday evening which even made his eyes hurt.  Does report chills and achiness.  Subjective fever last night as well but no other fevers.  No nausea, vomiting, diarrhea.  Sick contacts include his son as well as his son's friend who is over their house.  Objective:  BP 130/80   Pulse 92   Temp 98 F (36.7 C) (Oral)   Wt 174 lb 3.2 oz (79 kg)   SpO2 100%   BMI 24.30 kg/m  Vitals and nursing note reviewed  General: well nourished, in no acute distress HEENT: normocephalic, TM's visualized bilaterally, PERRL, no scleral icterus or conjunctival pallor, no nasal discharge, moist mucous membranes, good dentition without erythema or discharge noted in posterior oropharynx Neck: supple, non-tender, without lymphadenopathy Cardiac: RRR, clear S1 and S2, no murmurs, rubs, or gallops Respiratory: diffuse wheezing bilaterally, no increased work of breathing, speaking full sentences  Abdomen: soft, nontender, nondistended, no masses or organomegaly. Bowel sounds present Extremities: no edema or cyanosis. Warm, well perfused. 2+ radial pulses bilaterally Skin: warm and dry, no rashes noted Neuro: alert and oriented, no focal deficits   Assessment & Plan:    COPD exacerbation (HCC) Acute exacerbation, with productive cough for 3 days . Wheezing on auscultation, however good air movement and normal WOB on RA with O2 sat 98%. Amb pulse ox showing pulse ox of 97%. Afebrile and saturations ok, so will  hold off on admission. Will treat with steroid burst and abx. Patient understanding of this and understanding of when to return.  - Prednisone 40mg  qd x5d - Azithro (z-pack) x5d - Patient refusing lab work today  - Return precautions given, patient voiced understanding --Will obtain CXR if worsening symptoms, however, not needed at this time  - Advised that if he feels short of breath or worsening symptoms to go to the emergency department     Return in about 2 weeks (around 07/17/2018).   Caroline More, DO, PGY-2

## 2018-07-03 NOTE — Assessment & Plan Note (Addendum)
Acute exacerbation, with productive cough for 3 days . Wheezing on auscultation, however good air movement and normal WOB on RA with O2 sat 98%. Amb pulse ox showing pulse ox of 97%. Afebrile and saturations ok, so will hold off on admission. Will treat with steroid burst and abx. Patient understanding of this and understanding of when to return.  - Prednisone 40mg  qd x5d - Azithro (z-pack) x5d - Patient refusing lab work today  - Return precautions given, patient voiced understanding --Will obtain CXR if worsening symptoms, however, not needed at this time  - Advised that if he feels short of breath or worsening symptoms to go to the emergency department

## 2018-07-09 ENCOUNTER — Ambulatory Visit: Payer: Self-pay | Admitting: Family Medicine

## 2018-12-19 ENCOUNTER — Other Ambulatory Visit: Payer: Self-pay | Admitting: Family Medicine

## 2018-12-19 ENCOUNTER — Other Ambulatory Visit: Payer: Self-pay | Admitting: Internal Medicine

## 2018-12-19 MED ORDER — LOSARTAN POTASSIUM 100 MG PO TABS: 100.0000 mg | ORAL_TABLET | Freq: Every day | ORAL | 0 refills | Status: DC

## 2018-12-19 NOTE — Progress Notes (Signed)
Telemedicine telephone visit  Received after hours call for patient stating that he had recently gotten his insurance approved and that he can get his medications.  He was able to pick up the rest of his medications, but stated that his losartan prescription was out of date and he needed approval to go pick this up.  Patient does have appointment with Dr. Garlan Fillers, his PCP, on 6/5.  Will send in 2-week supply of losartan for patient to get blood pressure control.  At follow-up Dr. Garlan Fillers can determine if he needs change of his medication or dose.  Explained that would not normally prescribe medications via the emergency line but I feel it will be very helpful for Dr. Garlan Fillers if he has knowledge of the patient's blood pressure while on his current medication regimen.  Patient voiced understanding and is in agreement.  Guadalupe Dawn MD PGY-2 Family Medicine Resident

## 2018-12-25 ENCOUNTER — Ambulatory Visit (INDEPENDENT_AMBULATORY_CARE_PROVIDER_SITE_OTHER): Payer: Medicaid Other | Admitting: Family Medicine

## 2018-12-25 ENCOUNTER — Other Ambulatory Visit: Payer: Self-pay | Admitting: Family Medicine

## 2018-12-25 ENCOUNTER — Other Ambulatory Visit: Payer: Self-pay

## 2018-12-25 DIAGNOSIS — R0602 Shortness of breath: Secondary | ICD-10-CM

## 2018-12-25 DIAGNOSIS — F3162 Bipolar disorder, current episode mixed, moderate: Secondary | ICD-10-CM | POA: Diagnosis not present

## 2018-12-25 DIAGNOSIS — J441 Chronic obstructive pulmonary disease with (acute) exacerbation: Secondary | ICD-10-CM | POA: Diagnosis not present

## 2018-12-25 DIAGNOSIS — R112 Nausea with vomiting, unspecified: Secondary | ICD-10-CM | POA: Diagnosis not present

## 2018-12-25 DIAGNOSIS — I10 Essential (primary) hypertension: Secondary | ICD-10-CM

## 2018-12-25 DIAGNOSIS — K219 Gastro-esophageal reflux disease without esophagitis: Secondary | ICD-10-CM

## 2018-12-25 DIAGNOSIS — R1084 Generalized abdominal pain: Secondary | ICD-10-CM

## 2018-12-25 DIAGNOSIS — J449 Chronic obstructive pulmonary disease, unspecified: Secondary | ICD-10-CM

## 2018-12-25 MED ORDER — CYCLOBENZAPRINE HCL 10 MG PO TABS: ORAL_TABLET | ORAL | 2 refills | Status: DC

## 2018-12-25 MED ORDER — PANTOPRAZOLE SODIUM 40 MG PO TBEC: 40.0000 mg | DELAYED_RELEASE_TABLET | Freq: Two times a day (BID) | ORAL | 3 refills | Status: DC

## 2018-12-25 MED ORDER — BUSPIRONE HCL 15 MG PO TABS: 15.0000 mg | ORAL_TABLET | Freq: Every day | ORAL | 3 refills | Status: DC

## 2018-12-25 MED ORDER — ALBUTEROL SULFATE HFA 108 (90 BASE) MCG/ACT IN AERS: 2.0000 | INHALATION_SPRAY | Freq: Four times a day (QID) | RESPIRATORY_TRACT | 1 refills | Status: DC | PRN

## 2018-12-25 MED ORDER — FLUTICASONE-UMECLIDIN-VILANT 100-62.5-25 MCG/INH IN AEPB: 1.0000 | INHALATION_SPRAY | Freq: Every day | RESPIRATORY_TRACT | 5 refills | Status: DC

## 2018-12-25 MED ORDER — LURASIDONE HCL 40 MG PO TABS: ORAL_TABLET | ORAL | 0 refills | Status: DC

## 2018-12-25 MED ORDER — LOSARTAN POTASSIUM 100 MG PO TABS: 100.0000 mg | ORAL_TABLET | Freq: Every day | ORAL | 0 refills | Status: DC

## 2018-12-25 NOTE — Progress Notes (Signed)
Subjective: No chief complaint on file.   HPI: Timothy [name] is a 53 y.o. presenting to clinic today to discuss the following:  Medication Refills Patient presents today to get his medications refilled. He has no complaints and states overall he has been doing well, staying at home and minimizing his activities due to [place]. He recently had lost insurance do to losing his disability status but recently got it reinstated. He had been out of his medications for several months and comes in today to get them all refilled. He is still smoking tobacco and not ready to quit.  On a side note, he was unable to afford his pain medication or his benzodiazepines while he was not insured. We discussed the possibility of not refilling these moving forward since he has gone several months without them. I again expressed my concern for his overall health given the high potential for abuse and the potential dangerous mixture of both of these prescription medications. The patient was pleasant and agreeable to not refill these and expressed a desire to not refill them in the future.   We did discuss that if he has pain and or anxiety to please schedule an appointment with me asap and we will discuss alternative treatment measures at that time.  He denies fever, chills, cough, SOB, chest pain, abdominal pain, nausea, vomiting, diarrhea, constipation, blood in the urine or stool     ROS noted in HPI.   Past Medical, Surgical, Social, and Family History Reviewed & Updated per EMR.   Pertinent Historical Findings include:   Social History   Tobacco Use  Smoking Status Current Every Day Smoker  . Packs/day: 2.00  . Years: 34.00  . Pack years: 68.00  . Types: Cigarettes  . Start date: 07/22/1981  Smokeless Tobacco Former Systems developer  . Types: Chew  . Quit date: 12/12/2010  Tobacco Comment   Current 2 PPD smoker - [place]    Objective: BP 135/80   Pulse 90   SpO2 99%  Vitals and nursing notes  reviewed  Physical Exam Gen: Alert and Oriented x 3, NAD HEENT: Normocephalic, atraumatic CV: RRR, no murmurs, normal S1, S2 split Resp: CTAB, diffuse mild wheezing mostly in the bibasilar lung fields, no rales, or rhonchi, comfortable work of breathing Abd: non-distended, several old, well healed surgical scars, non-tender, soft, +bs in all four quadrants Skin: warm, dry, intact, no rashes  No results found for this or any previous visit (from the past 72 hour(s)).  Assessment/Plan:  Essential hypertension, benign BP well controlled at today's visit despite being out of mediations. - Refill Losartan 100mg  daily   Chronic obstructive pulmonary disease (HCC) No acute exacerbation on exam today or recently. Counseled on the value of quitting smoking but he is not ready - Refill Trelegy  Bipolar disorder Patient was on Buspar and Latuda that was both started in our clinic (Latuda started by Timothy [name] while he was working with Timothy [name] back prior to 2018). - Refill Buspar 15mg  daily - Refill Latuda 40mg  daily - Would like him to establish care with a psychiatrist for further management of Bipolar disorder and since I did not start him on Latuda would like their thoughts on other options. Of note, patient seems to think Timothy [name] is working for him.    PATIENT EDUCATION PROVIDED: See AVS    Diagnosis and plan along with any newly prescribed medication(s) were discussed in detail with this patient today. The patient verbalized understanding and  agreed with the plan. Patient advised if symptoms worsen return to clinic or ER.   No orders of the defined types were placed in this encounter.   Meds ordered this encounter  Medications  . losartan (COZAAR) 100 MG tablet    Sig: Take 1 tablet (100 mg total) by mouth daily.    Dispense:  14 tablet    Refill:  0  . lurasidone (LATUDA) 40 MG TABS tablet    Sig: TAKE 1 TABLET(40 MG) BY MOUTH DAILY WITH BREAKFAST    Dispense:  30 tablet     Refill:  0  . Fluticasone-Umeclidin-Vilant (TRELEGY ELLIPTA) 100-62.5-25 MCG/INH AEPB    Sig: Inhale 1 puff into the lungs daily.    Dispense:  28 each    Refill:  5    Order Specific Question:   Lot Number?    Answer:   [id number]    Order Specific Question:   Expiration Date?    Answer:   03/21/2018  . DISCONTD: albuterol (VENTOLIN HFA) 108 (90 Base) MCG/ACT inhaler    Sig: Inhale 2 puffs into the lungs every 6 (six) hours as needed for wheezing or shortness of breath.    Dispense:  1 Inhaler    Refill:  1  . cyclobenzaprine (FLEXERIL) 10 MG tablet    Sig: TAKE 1 TABLET BY MOUTH EVERY DAY AS NEEDED FOR MUSCLE SPASMS    Dispense:  30 tablet    Refill:  2  . pantoprazole (PROTONIX) 40 MG tablet    Sig: Take 1 tablet (40 mg total) by mouth 2 (two) times daily.    Dispense:  60 tablet    Refill:  3  . busPIRone (BUSPAR) 15 MG tablet    Sig: Take 1 tablet (15 mg total) by mouth at bedtime.    Dispense:  90 tablet    Refill:  [place], DO 12/25/2018, 10:23 AM PGY-2 [place]

## 2018-12-25 NOTE — Patient Instructions (Addendum)
It was great to see you today! Thank you for letting me participate in your care!  Today, we discussed your wrist numbness and tingling which is most likely due to carpal tunnel syndrome. I have included a list of home exercises I would like for you to do. I have also included a wrist brace to wear at night.  Your refills have been sent to your pharmacy. Please pick them up and restart your prescriptions today.  Take care and continue to stay safe!     Carpal Tunnel Syndrome  Carpal tunnel syndrome is a condition that causes pain in your hand and arm. The carpal tunnel is a narrow area that is on the palm side of your wrist. Repeated wrist motion or certain diseases may cause swelling in the tunnel. This swelling can pinch the main nerve in the wrist (median nerve). What are the causes? This condition may be caused by:  Repeated wrist motions.  Wrist injuries.  Arthritis.  A sac of fluid (cyst) or abnormal growth (tumor) in the carpal tunnel.  Fluid buildup during pregnancy. Sometimes the cause is not known. What increases the risk? The following factors may make you more likely to develop this condition:  Having a job in which you move your wrist in the same way many times. This includes jobs like being a Software engineer or a Scientist, water quality.  Being a woman.  Having other health conditions, such as: ? Diabetes. ? Obesity. ? A thyroid gland that is not active enough (hypothyroidism). ? Kidney failure. What are the signs or symptoms? Symptoms of this condition include:  A tingling feeling in your fingers.  Tingling or a loss of feeling (numbness) in your hand.  Pain in your entire arm. This pain may get worse when you bend your wrist and elbow for a long time.  Pain in your wrist that goes up your arm to your shoulder.  Pain that goes down into your palm or fingers.  A weak feeling in your hands. You may find it hard to grab and hold items. You may feel worse at night. How is  this diagnosed? This condition is diagnosed with a medical history and physical exam. You may also have tests, such as:  Electromyogram (EMG). This test checks the signals that the nerves send to the muscles.  Nerve conduction study. This test checks how well signals pass through your nerves.  Imaging tests, such as X-rays, ultrasound, and MRI. These tests check for what might be the cause of your condition. How is this treated? This condition may be treated with:  Lifestyle changes. You will be asked to stop or change the activity that caused your problem.  Doing exercise and activities that make bones and muscles stronger (physical therapy).  Learning how to use your hand again (occupational therapy).  Medicines for pain and swelling (inflammation). You may have injections in your wrist.  A wrist splint.  Surgery. Follow these instructions at home: If you have a splint:  Wear the splint as told by your doctor. Remove it only as told by your doctor.  Loosen the splint if your fingers: ? Tingle. ? Lose feeling (become numb). ? Turn cold and blue.  Keep the splint clean.  If the splint is not waterproof: ? Do not let it get wet. ? Cover it with a watertight covering when you take a bath or a shower. Managing pain, stiffness, and swelling   If told, put ice on the painful area: ? If you  have a removable splint, remove it as told by your doctor. ? Put ice in a plastic bag. ? Place a towel between your skin and the bag. ? Leave the ice on for 20 minutes, 2-3 times per day. General instructions  Take over-the-counter and prescription medicines only as told by your doctor.  Rest your wrist from any activity that may cause pain. If needed, talk with your boss at work about changes that can help your wrist heal.  Do any exercises as told by your doctor, physical therapist, or occupational therapist.  Keep all follow-up visits as told by your doctor. This is  important. Contact a doctor if:  You have new symptoms.  Medicine does not help your pain.  Your symptoms get worse. Get help right away if:  You have very bad numbness or tingling in your wrist or hand. Summary  Carpal tunnel syndrome is a condition that causes pain in your hand and arm.  It is often caused by repeated wrist motions.  Lifestyle changes and medicines are used to treat this problem. Surgery may help in very bad cases.  Follow your doctor's instructions about wearing a splint, resting your wrist, keeping follow-up visits, and calling for help. This information is not intended to replace advice given to you by your health care provider. Make sure you discuss any questions you have with your health care provider. Document Released: 06/27/2011 Document Revised: 11/14/2017 Document Reviewed: 11/14/2017 Elsevier Interactive Patient Education  2019 [place], Harolyn Rutherford, DO PGY-2, [name]

## 2018-12-28 ENCOUNTER — Telehealth: Payer: Self-pay

## 2018-12-28 NOTE — Telephone Encounter (Signed)
Trelegy is not covered by patients insurance. Below are preferred medications for medicaid.

## 2018-12-29 ENCOUNTER — Encounter: Payer: Self-pay | Admitting: Family Medicine

## 2018-12-29 NOTE — Assessment & Plan Note (Signed)
Patient was on Buspar and Latuda that was both started in our clinic (Latuda started by Dr. Tammi Klippel while he was working with Dr. Gwenlyn Saran back prior to 2018). - Refill Buspar 15mg  daily - Refill Latuda 40mg  daily - Would like him to establish care with a psychiatrist for further management of Bipolar disorder and since I did not start him on Latuda would like their thoughts on other options. Of note, patient seems to think Anette Guarneri is working for him.

## 2018-12-29 NOTE — Assessment & Plan Note (Signed)
No acute exacerbation on exam today or recently. Counseled on the value of quitting smoking but he is not ready - Refill Trelegy

## 2018-12-29 NOTE — Assessment & Plan Note (Signed)
BP well controlled at today's visit despite being out of mediations. - Refill Losartan 100mg  daily

## 2019-01-11 NOTE — Telephone Encounter (Signed)
Checking status.  Timothy [name], CMA  

## 2019-01-20 ENCOUNTER — Other Ambulatory Visit: Payer: Self-pay

## 2019-01-20 ENCOUNTER — Encounter: Payer: Self-pay | Admitting: Family Medicine

## 2019-01-20 ENCOUNTER — Ambulatory Visit: Payer: Medicaid Other | Admitting: Family Medicine

## 2019-01-20 VITALS — BP 148/82 | HR 81

## 2019-01-20 DIAGNOSIS — M25531 Pain in right wrist: Secondary | ICD-10-CM | POA: Diagnosis not present

## 2019-01-20 MED ORDER — IBUPROFEN 600 MG PO TABS: 600.0000 mg | ORAL_TABLET | Freq: Three times a day (TID) | ORAL | 0 refills | Status: AC | PRN

## 2019-01-20 NOTE — Progress Notes (Signed)
Subjective: Chief Complaint  Patient presents with  . Follow-up     HPI: Timothy [name] is a 53 y.o. presenting to clinic today to discuss the following:  Right Wrist Pain Patient returns for a follow up visit for right wrist pain. He was seen by myself two weeks ago with symptoms consistent with carpal tunnel syndrome. He did not buy a brace and presents today with continued wrist pain that is now radiating up his elbow. Additionally, he punched a door with his right hand and has pain and mild swelling in his right wrist. The pain is worse with wrist movement and somewhat better with rest. His symptoms of numbness, tingling, and burning remain in the distribution of his thumb, 2nd and 3rd digits of the right hand.  No fever, chills, skin changes, nausea, vomiting, diarrhea, or constipation.     ROS noted in HPI.   Past Medical, Surgical, Social, and Family History Reviewed & Updated per EMR.   Pertinent Historical Findings include:   Social History   Tobacco Use  Smoking Status Current Every Day Smoker  . Packs/day: 2.00  . Years: 34.00  . Pack years: 68.00  . Types: Cigarettes  . Start date: 07/22/1981  Smokeless Tobacco Former Systems developer  . Types: Chew  . Quit date: 12/12/2010  Tobacco Comment   Current 2 PPD smoker - [place]      Objective: BP (!) 148/82   Pulse 81   SpO2 98%  Vitals and nursing notes reviewed  Physical Exam Gen: Alert and Oriented x 3, NAD HEENT: Normocephalic, atraumatic CV: RRR, no murmurs, normal S1, S2 split Resp: mild scattered wheezing, no rales, or rhonchi, comfortable work of breathing MSK: Wrist, Right: TTP noted at the distal radius head. Inspection yielded no erythema, ecchymosis, bony deformity, but mild some swelling. ROM limited due to pain in flexion and extension and ulnar/radial deviation that is non-symmetrical with opposite wrist. Palpation is normal over metacarpals, scaphoid, lunate, and TFCC; tendons without  tenderness/swelling. Strength 5/5 in all directions without pain. Negative Finkelstein, positive tinel's and phalens. Skin: warm, dry, intact, no rashes  No results found for this or any previous visit (from the past 72 hour(s)).  Assessment/Plan:  Wrist pain Differential includes carpal tunnel, wrist fracture vs sprain, chronic pain manifestation vs gout vs autoimmune disease such as RA. His symptoms are most consistent with carpal tunnel syndrome made worse by his acute injury from blunt force trauma to the hand and wrist. No erythema or warmth with no fever makes infectious etiology less likely. Gout should be more red, swollen and more tender and painful. No signs of MCP or PCP changes or pain in these joints to suggest RA.  - Obtain right wrist x-ray to rule out fracture - Advised patient to obtain wrist splint to wear only at night - Given Ibuprofen 600mg  TID for the next two weeks for acute pain; goal is to avoid opiates given his history if possible   PATIENT EDUCATION PROVIDED: See AVS    Diagnosis and plan along with any newly prescribed medication(s) were discussed in detail with this patient today. The patient verbalized understanding and agreed with the plan. Patient advised if symptoms worsen return to clinic or ER.    Orders Placed This Encounter  Procedures  . DG Wrist Complete Right    Standing Status:   Future    Number of Occurrences:   1    Standing Expiration Date:   03/23/2019  Order Specific Question:   Reason for Exam (SYMPTOM  OR DIAGNOSIS REQUIRED)    Answer:   wrist pain and swelling    Order Specific Question:   Preferred imaging location?    Answer:   [id number]    Order Specific Question:   Radiology Contrast Protocol - do NOT remove file path    Answer:   [email or web address]    Meds ordered this encounter  Medications  . ibuprofen (ADVIL) 600 MG tablet    Sig: Take 1 tablet (600 mg total) by mouth every 8  (eight) hours as needed for up to 14 days.    Dispense:  30 tablet    Refill:  0     Harolyn Rutherford, DO 01/20/2019, 2:33 PM PGY-2 [place]

## 2019-01-20 NOTE — Patient Instructions (Addendum)
It was great to see you today! Thank you for letting me participate in your care!  Today, we discussed your continued wrist pain which I believe is due to carpal tunnel syndrome. Please purchase a wrist splint to wear at night and wear it every night for 4 weeks. If no improvement please call and schedule an appointment to be seen with me and we will take the next step. I have also given you a short course of NSAIDs to help with your wrist pain and swelling from punching a door. I will call you with results of your x-rays.    Be well, Harolyn Rutherford, DO PGY-2, [name]   Carpal Tunnel Syndrome  Carpal tunnel syndrome is a condition that causes pain in your hand and arm. The carpal tunnel is a narrow area that is on the palm side of your wrist. Repeated wrist motion or certain diseases may cause swelling in the tunnel. This swelling can pinch the main nerve in the wrist (median nerve). What are the causes? This condition may be caused by:  Repeated wrist motions.  Wrist injuries.  Arthritis.  A sac of fluid (cyst) or abnormal growth (tumor) in the carpal tunnel.  Fluid buildup during pregnancy. Sometimes the cause is not known. What increases the risk? The following factors may make you more likely to develop this condition:  Having a job in which you move your wrist in the same way many times. This includes jobs like being a Software engineer or a Scientist, water quality.  Being a woman.  Having other health conditions, such as: ? Diabetes. ? Obesity. ? A thyroid gland that is not active enough (hypothyroidism). ? Kidney failure. What are the signs or symptoms? Symptoms of this condition include:  A tingling feeling in your fingers.  Tingling or a loss of feeling (numbness) in your hand.  Pain in your entire arm. This pain may get worse when you bend your wrist and elbow for a long time.  Pain in your wrist that goes up your arm to your shoulder.  Pain that goes down into your  palm or fingers.  A weak feeling in your hands. You may find it hard to grab and hold items. You may feel worse at night. How is this diagnosed? This condition is diagnosed with a medical history and physical exam. You may also have tests, such as:  Electromyogram (EMG). This test checks the signals that the nerves send to the muscles.  Nerve conduction study. This test checks how well signals pass through your nerves.  Imaging tests, such as X-rays, ultrasound, and MRI. These tests check for what might be the cause of your condition. How is this treated? This condition may be treated with:  Lifestyle changes. You will be asked to stop or change the activity that caused your problem.  Doing exercise and activities that make bones and muscles stronger (physical therapy).  Learning how to use your hand again (occupational therapy).  Medicines for pain and swelling (inflammation). You may have injections in your wrist.  A wrist splint.  Surgery. Follow these instructions at home: If you have a splint:  Wear the splint as told by your doctor. Remove it only as told by your doctor.  Loosen the splint if your fingers: ? Tingle. ? Lose feeling (become numb). ? Turn cold and blue.  Keep the splint clean.  If the splint is not waterproof: ? Do not let it get wet. ? Cover it with a watertight  covering when you take a bath or a shower. Managing pain, stiffness, and swelling   If told, put ice on the painful area: ? If you have a removable splint, remove it as told by your doctor. ? Put ice in a plastic bag. ? Place a towel between your skin and the bag. ? Leave the ice on for 20 minutes, 2-3 times per day. General instructions  Take over-the-counter and prescription medicines only as told by your doctor.  Rest your wrist from any activity that may cause pain. If needed, talk with your boss at work about changes that can help your wrist heal.  Do any exercises as told by  your doctor, physical therapist, or occupational therapist.  Keep all follow-up visits as told by your doctor. This is important. Contact a doctor if:  You have new symptoms.  Medicine does not help your pain.  Your symptoms get worse. Get help right away if:  You have very bad numbness or tingling in your wrist or hand. Summary  Carpal tunnel syndrome is a condition that causes pain in your hand and arm.  It is often caused by repeated wrist motions.  Lifestyle changes and medicines are used to treat this problem. Surgery may help in very bad cases.  Follow your doctor's instructions about wearing a splint, resting your wrist, keeping follow-up visits, and calling for help. This information is not intended to replace advice given to you by your health care provider. Make sure you discuss any questions you have with your health care provider. Document Released: 06/27/2011 Document Revised: 11/14/2017 Document Reviewed: 11/14/2017 Elsevier Patient Education  2020 [employer].

## 2019-01-21 ENCOUNTER — Ambulatory Visit
Admission: RE | Admit: 2019-01-21 | Discharge: 2019-01-21 | Disposition: A | Discharge status: Home / Self Care | Payer: Medicaid Other | Source: Ambulatory Visit | Attending: Family Medicine | Admitting: Family Medicine

## 2019-01-21 DIAGNOSIS — M25531 Pain in right wrist: Secondary | ICD-10-CM

## 2019-01-26 DIAGNOSIS — M25539 Pain in unspecified wrist: Secondary | ICD-10-CM | POA: Insufficient documentation

## 2019-01-26 HISTORY — DX: Pain in unspecified wrist: M25.539

## 2019-01-26 NOTE — Assessment & Plan Note (Signed)
Differential includes carpal tunnel, wrist fracture vs sprain, chronic pain manifestation vs gout vs autoimmune disease such as RA. His symptoms are most consistent with carpal tunnel syndrome made worse by his acute injury from blunt force trauma to the hand and wrist. No erythema or warmth with no fever makes infectious etiology less likely. Gout should be more red, swollen and more tender and painful. No signs of MCP or PCP changes or pain in these joints to suggest RA.  - Obtain right wrist x-ray to rule out fracture - Advised patient to obtain wrist splint to wear only at night - Given Ibuprofen 600mg  TID for the next two weeks for acute pain; goal is to avoid opiates given his history if possible

## 2019-02-10 ENCOUNTER — Telehealth: Payer: Self-pay | Admitting: Family Medicine

## 2019-02-10 ENCOUNTER — Ambulatory Visit: Payer: Medicare Other | Admitting: Family Medicine

## 2019-02-10 NOTE — Telephone Encounter (Signed)
Patient called the after-hours emergency line at the clinic.  He said that he has been crampy and achy in his muscles primarily neck and hands.  Some around his back.  No shortness of breath or chest pain, he did miss an appointment with his primary care physician on July 21 because he said he felt too poorly to come in.  The day prior he was outside in the sun all day long working, of note he says that he drank 8 to 9,32 ounce bottles of Gatorade except not Gatorade he feels the bottles with water.  We discussed how if he is going to drink that volume of fluids some of it should not be water because hyponatremia can be a concern.  We told him to go and buy a couple more bottles of Gatorade and drink those and then to come in and see a physician in the morning at the access to care clinic.  We schedule an appoint with him at 920 AM  We reminded him that even if he feels achy and crampy, we cannot help him if he is at home and his best bet is to come into the clinic and make his appointment. Dr. Criss Rosales

## 2019-02-11 ENCOUNTER — Ambulatory Visit: Payer: Medicare Other

## 2019-02-22 ENCOUNTER — Telehealth: Payer: Self-pay | Admitting: *Deleted

## 2019-02-22 ENCOUNTER — Ambulatory Visit (INDEPENDENT_AMBULATORY_CARE_PROVIDER_SITE_OTHER): Payer: Medicare Other | Admitting: Family Medicine

## 2019-02-22 ENCOUNTER — Other Ambulatory Visit: Payer: Self-pay

## 2019-02-22 VITALS — BP 120/80 | HR 85

## 2019-02-22 DIAGNOSIS — J441 Chronic obstructive pulmonary disease with (acute) exacerbation: Secondary | ICD-10-CM | POA: Diagnosis not present

## 2019-02-22 DIAGNOSIS — M25531 Pain in right wrist: Secondary | ICD-10-CM

## 2019-02-22 DIAGNOSIS — G5601 Carpal tunnel syndrome, right upper limb: Secondary | ICD-10-CM

## 2019-02-22 DIAGNOSIS — R0602 Shortness of breath: Secondary | ICD-10-CM

## 2019-02-22 DIAGNOSIS — J449 Chronic obstructive pulmonary disease, unspecified: Secondary | ICD-10-CM

## 2019-02-22 MED ORDER — DULERA 100-5 MCG/ACT IN AERO: 2.0000 | INHALATION_SPRAY | Freq: Two times a day (BID) | RESPIRATORY_TRACT | 2 refills | Status: DC

## 2019-02-22 MED ORDER — ALBUTEROL SULFATE HFA 108 (90 BASE) MCG/ACT IN AERS: INHALATION_SPRAY | RESPIRATORY_TRACT | 2 refills | Status: DC

## 2019-02-22 MED ORDER — IBUPROFEN 600 MG PO TABS: 600.0000 mg | ORAL_TABLET | Freq: Three times a day (TID) | ORAL | 0 refills | Status: DC | PRN

## 2019-02-22 MED ORDER — SPIRIVA HANDIHALER 18 MCG IN CAPS: 18.0000 ug | ORAL_CAPSULE | Freq: Two times a day (BID) | RESPIRATORY_TRACT | 12 refills | Status: DC

## 2019-02-22 NOTE — Progress Notes (Signed)
Subjective: Chief Complaint  Patient presents with  . Hand Pain    HPI: Timothy [name] is a 53 y.o. presenting to clinic today to discuss the following:  Right Wrist Pain Patient has been wearing his brace every night and also at times during the day when not using his right hand as much. He states it is still painful and is still experiencing numbness in the 1st, 2nd, and 3rd digits with "shocks" felt along the 4th digit. He says it has not improved since his last visit. He describes a continual "ache" and now had tenderness in between the middle mcp joints on the palm of his hand.  COPD Was not able to refill Trelegy as it is no longer covered by his insurance and it is too expensive. Will switch to Spiriva and Dulera.    ROS noted in HPI.   Past Medical, Surgical, Social, and Family History Reviewed & Updated per EMR.   Pertinent Historical Findings include:   Social History   Tobacco Use  Smoking Status Current Every Day Smoker  . Packs/day: 2.00  . Years: 34.00  . Pack years: 68.00  . Types: Cigarettes  . Start date: 07/22/1981  Smokeless Tobacco Former Systems developer  . Types: Chew  . Quit date: 12/12/2010  Tobacco Comment   Current 2 PPD smoker - [place]    Objective: BP 120/80   Pulse 85   SpO2 98%  Vitals and nursing notes reviewed  Physical Exam Gen: Alert and Oriented x 3, NAD CV: RRR, no murmurs, normal S1, S2 split Resp: CTAB, no wheezing, rales, or rhonchi, comfortable work of breathing MSK: Wrist, Right: TTP noted in between the 2nd and 3rd MCP joints. Inspection yielded no erythema, ecchymosis, bony deformity, or swelling. ROM full with good flexion and extension and ulnar/radial deviation that is symmetrical with opposite wrist. Palpation is normal over metacarpals, scaphoid, lunate, and TFCC; tendons without tenderness/swelling. Strength 5/5 in all directions without pain. Positive tinel's and phalens. Ext: no clubbing, cyanosis, or edema Skin: warm,  dry, intact, no rashes  No results found for this or any previous visit (from the past 72 hour(s)).  Assessment/Plan:  Chronic obstructive pulmonary disease ([place]) Trelegy no longer covered by patient insurance. - Started Spiriva one capsule, 2 puffs once per day - Started Dulera 2 puffs BID  Wrist pain Wrist pain most consistent with carpal tunnel that has failed conservative management. X-rays of his wrist were negative for any bony deformity or fracture. I did discuss with the patient that this is most likely going to be a reoccurring issue and the goal is to stop the acute exacerbation. He elects for evaluation by sports medicine and possible steroid injection before pursuing surgery. - Referral to sports medicine; consider median nerve corticosteroid injection if ultrasound findings consistent with carpal tunnel   PATIENT EDUCATION PROVIDED: See AVS    Diagnosis and plan along with any newly prescribed medication(s) were discussed in detail with this patient today. The patient verbalized understanding and agreed with the plan. Patient advised if symptoms worsen return to clinic or ER.    Orders Placed This Encounter  Procedures  . Ambulatory referral to Sports Medicine    Referral Priority:   Routine    Referral Type:   Consultation    Number of Visits Requested:   1    Meds ordered this encounter  Medications  . tiotropium (SPIRIVA HANDIHALER) 18 MCG inhalation capsule    Sig: Place 1 capsule (18  mcg total) into inhaler and inhale 2 (two) times daily.    Dispense:  30 capsule    Refill:  12  . mometasone-formoterol (DULERA) 100-5 MCG/ACT AERO    Sig: Inhale 2 puffs into the lungs 2 (two) times daily.    Dispense:  13 g    Refill:  2  . ibuprofen (ADVIL) 600 MG tablet    Sig: Take 1 tablet (600 mg total) by mouth every 8 (eight) hours as needed.    Dispense:  30 tablet    Refill:  0  . albuterol (VENTOLIN HFA) 108 (90 Base) MCG/ACT inhaler    Sig: INHALE 2 PUFFS INTO  THE LUNGS EVERY 6 HOURS AS NEEDED FOR WHEEZING OR SHORTNESS OF BREATH    Dispense:  54 g    Refill:  2    **Patient requests 90 days supplyHarolyn Rutherford, DO 02/22/2019, 2:10 PM PGY-3 [place]

## 2019-02-22 NOTE — Patient Instructions (Signed)
It was great to see you today! Thank you for letting me participate in your care!  Today, we discussed your continued right wrist pain that is not improving with conservative management. I am sending you to our sports medicine clinic for further management and evaluation. They may do a ultrasound guided steroid injection if they find inflammation around your median nerve.  Be well, Timothy Rutherford, DO PGY-3, [name]

## 2019-02-22 NOTE — Telephone Encounter (Signed)
Pharmacy needs clarification on spiriva.  Normal dose is one capsule with 2 inhalations at the same time, not 2 times daily.  Will forward to MD for clarification. Christen Bame, CMA

## 2019-02-24 NOTE — Assessment & Plan Note (Signed)
Trelegy no longer covered by patient insurance. - Started Spiriva one capsule, 2 puffs once per day - Started Dulera 2 puffs BID

## 2019-02-24 NOTE — Assessment & Plan Note (Signed)
Wrist pain most consistent with carpal tunnel that has failed conservative management. X-rays of his wrist were negative for any bony deformity or fracture. I did discuss with the patient that this is most likely going to be a reoccurring issue and the goal is to stop the acute exacerbation. He elects for evaluation by sports medicine and possible steroid injection before pursuing surgery. - Referral to sports medicine; consider median nerve corticosteroid injection if ultrasound findings consistent with carpal tunnel

## 2019-03-01 ENCOUNTER — Encounter: Payer: Self-pay | Admitting: Family Medicine

## 2019-03-01 ENCOUNTER — Other Ambulatory Visit: Payer: Self-pay

## 2019-03-01 ENCOUNTER — Ambulatory Visit (INDEPENDENT_AMBULATORY_CARE_PROVIDER_SITE_OTHER): Payer: Medicare Other | Admitting: Family Medicine

## 2019-03-01 VITALS — BP 170/80 | Ht 69.0 in | Wt 174.0 lb

## 2019-03-01 DIAGNOSIS — G5601 Carpal tunnel syndrome, right upper limb: Secondary | ICD-10-CM | POA: Diagnosis present

## 2019-03-01 MED ORDER — METHYLPREDNISOLONE ACETATE 40 MG/ML IJ SUSP
40.0000 mg | Freq: Once | INTRAMUSCULAR | Status: AC
  Administered 2019-03-01: 40 mg via INTRA_ARTICULAR

## 2019-03-01 NOTE — Patient Instructions (Signed)
You have carpal tunnel syndrome. Wear the wrist brace at nighttime and as often as possible during the day Tylenol, ibuprofen if needed. Corticosteroid injection is a consideration to help with pain and inflammation - you were given this today If not improving, will consider nerve conduction studies to assess severity. Follow up with me in 6 weeks.

## 2019-03-01 NOTE — Progress Notes (Signed)
PCP: Nuala Alpha, DO  Subjective:   HPI: Patient is a 53 y.o. male here for right hand numbness and pain that started gradually 5 weeks ago. It is mostly at second, third and fourth fingers. He also reports right hand weakness and can not lift heavy items with only his right hand. He had a recent visit in family medicine clinic where he got right hand X-ray after he punched a cabinet. It did not show any fracture. He uses the splint at night and takes Ibuprofen 600 mg at bedtime for pain as needed. He mentions that his symptoms have not changed though.  He is right handed.  He denies shoulder or neck pain. Denies fever, chills, abdominal pain, chest pain. Has some shortness of breath due to COPD.  Past Medical History:  Diagnosis Date  . Acute bronchitis 02/15/2013  . Alcohol abuse   . Anxiety   . Bipolar disorder ([place])   . Bronchitis    history  . Bronchitis   . Cellulitis    - left knee-2009  . Chronic pain   . Condyloma 02/04/2012  . Depression   . Diverticulosis    By colonoscopy June 2005  . Dyspnea    on exertion at times  . Elevated CK 3/13  . Emphysema lung ([place])   . Emphysema of lung ([place])   . Family history of anesthesia complication    Mother N/V  . GERD (gastroesophageal reflux disease)   . Glaucoma syndrome    does not use eye drops, "They burn".  [place])    "alot; not daily" (07/23/2012)  . Heavy smoker   . Hemorrhoids   . History of dizziness   . History of stomach ulcers 2005  . HPV (human papilloma virus) infection   . Hypertension    started 3 months ago  . Hypoglycemia   . Incisional hernia   . Lower GI bleed    June 2005. Presumably secondary to diverticulosis.  . Migraines    "not often" (07/23/2012)    Current Outpatient Medications on File Prior to Visit  Medication Sig Dispense Refill  . albuterol (VENTOLIN HFA) 108 (90 Base) MCG/ACT inhaler INHALE 2 PUFFS INTO THE LUNGS EVERY 6 HOURS AS NEEDED FOR WHEEZING OR SHORTNESS OF BREATH  54 g 2  . busPIRone (BUSPAR) 15 MG tablet Take 1 tablet (15 mg total) by mouth at bedtime. 90 tablet 3  . cyclobenzaprine (FLEXERIL) 10 MG tablet TAKE 1 TABLET BY MOUTH EVERY DAY AS NEEDED FOR MUSCLE SPASMS 30 tablet 2  . diazepam (VALIUM) 5 MG tablet Take 1 tablet (5 mg total) by mouth daily as needed for anxiety or muscle spasms. 22 tablet 0  . ibuprofen (ADVIL) 600 MG tablet Take 1 tablet (600 mg total) by mouth every 8 (eight) hours as needed. 30 tablet 0  . losartan (COZAAR) 100 MG tablet TAKE ONE TABLET BY MOUTH DAILY TAKE 1 TABLET BY MOUTH EVERY DAY 90 tablet 3  . losartan (COZAAR) 100 MG tablet Take 1 tablet (100 mg total) by mouth daily. 14 tablet 0  . lurasidone (LATUDA) 40 MG TABS tablet TAKE 1 TABLET(40 MG) BY MOUTH DAILY WITH BREAKFAST 30 tablet 0  . mometasone-formoterol (DULERA) 100-5 MCG/ACT AERO Inhale 2 puffs into the lungs 2 (two) times daily. 13 g 2  . pantoprazole (PROTONIX) 40 MG tablet Take 1 tablet (40 mg total) by mouth 2 (two) times daily. 60 tablet 3  . tiotropium (SPIRIVA HANDIHALER) 18 MCG inhalation capsule Place 1  capsule (18 mcg total) into inhaler and inhale 2 (two) times daily. 30 capsule 12   No current facility-administered medications on file prior to visit.     Past Surgical History:  Procedure Laterality Date  . ABDOMINAL EXPLORATION SURGERY  07/23/2012   w/LOA (07/23/2012)  . APPENDECTOMY  1982  . COLON RESECTION  02/27/2012   Procedure: COLON RESECTION;  Surgeon: Merrie Roof, MD;  Location: [place];  Service: General;  Laterality: N/A;  . COLOSTOMY  02/27/2012   Procedure: COLOSTOMY;  Surgeon: Merrie Roof, MD;  Location: [place];  Service: General;  Laterality: N/A;  . COLOSTOMY TAKEDOWN  07/23/2012  . COLOSTOMY TAKEDOWN  07/23/2012   Procedure: COLOSTOMY TAKEDOWN;  Surgeon: Merrie Roof, MD;  Location: [place];  Service: General;  Laterality: N/A;  Primary Anastomosis  . ELBOW FRACTURE SURGERY  ~ 1975   left;  pins inserted   . INSERTION OF MESH N/A  05/20/2013   Procedure: INSERTION OF MESH;  Surgeon: Merrie Roof, MD;  Location: [place];  Service: General;  Laterality: N/A;  . LAPAROSCOPIC LEFT COLON RESECTION  02/19/2012   SIGMOID  . LAPAROTOMY  02/27/2012   Procedure: EXPLORATORY LAPAROTOMY;  Surgeon: Merrie Roof, MD;  Location: [place];  Service: General;  Laterality: N/A;  . LAPAROTOMY  07/23/2012   Procedure: EXPLORATORY LAPAROTOMY;  Surgeon: Merrie Roof, MD;  Location: [place];  Service: General;  Laterality: N/A;  . LESION DESTRUCTION  07/23/2012   Procedure: DESTRUCTION LESION ANUS;  Surgeon: Merrie Roof, MD;  Location: [place];  Service: General;  Laterality: N/A;  [place]  . LYSIS OF ADHESION  07/23/2012   Procedure: LYSIS OF ADHESION;  Surgeon: Merrie Roof, MD;  Location: [place];  Service: General;  Laterality: N/A;  . VENTRAL HERNIA REPAIR  05/20/2013   Dr Marlou Starks  . VENTRAL HERNIA REPAIR N/A 05/20/2013   Procedure: VENTRAL HERNIA REPAIR ;  Surgeon: Merrie Roof, MD;  Location: [place];  Service: General;  Laterality: N/A;  . WART FULGURATION  02/19/2012   Procedure: FULGURATION ANAL WART;  Surgeon: Merrie Roof, MD;  Location: [place];  Service: General;  Laterality: N/A;  Destroy  Anal Condyloma   . WISDOM TOOTH EXTRACTION  ~ 1983  . WOUND DEBRIDEMENT N/A 06/03/2016   Procedure: ABDOMINAL WOUND EXPLORATION AND STITCH REMOVAL;  Surgeon: [name], MD;  Location: [place];  Service: General;  Laterality: N/A;  ABDOMINAL WOUND EXPLORATION AND STITCH REMOVAL    Allergies  Allergen Reactions  . Ambien [Zolpidem Tartrate] Other (See Comments)    Caused agitation of mood.   [name] [Propoxyphene N-Acetaminophen] Hives and Itching  . Morphine And Related Itching  . Bactroban [Mupirocin Calcium] Swelling    Caused infection at a surgical site  . Lisinopril Cough    Social History   Socioeconomic History  . Marital status: Legally Separated    Spouse name: Not on file  .  Number of children: 3  . Years of education: Not on file  . Highest education level: Not on file  Occupational History  . Occupation: unemployed  Social Needs  . Financial resource strain: Not on file  . Food insecurity    Worry: Not on file    Inability: Not on file  . Transportation needs    Medical: Not on file    Non-medical: Not on file  Tobacco Use  . Smoking status: Current Every Day Smoker    Packs/day: 2.00    Years: 34.00    Pack years: 68.00    Types: Cigarettes    Start date: 07/22/1981  . Smokeless tobacco: Former Systems developer    Types: Chew    Quit date: 12/12/2010  . Tobacco comment: Current 2 PPD smoker - [place]  Substance and Sexual Activity  . Alcohol use: No    Alcohol/week: 0.0 standard drinks  . Drug use: Yes    Types: Oxycodone  . Sexual activity: Not on file  Lifestyle  . Physical activity    Days per week: Not on file    Minutes per session: Not on file  . Stress: Not on file  Relationships  . Social Herbalist on phone: Not on file    Gets together: Not on file    Attends religious service: Not on file    Active member of club or organization: Not on file    Attends meetings of clubs or organizations: Not on file    Relationship status: Not on file  . Intimate partner violence    Fear of current or ex partner: Not on file    Emotionally abused: Not on file    Physically abused: Not on file    Forced sexual activity: Not on file  Other Topics Concern  . Not on file  Social History Narrative   Lives with wife [place], Children Zebulun (age 29) Sterling Big (age 16) [name] (age 10).  Unemployed less than high school education.  Updated 10/29/11    Family History  Problem Relation Age of Onset  . Diabetes Mother   . Parkinsonism Mother   . Depression Mother   . Alcohol abuse Father   . Hypertension Father   . Anxiety disorder Father   . Ulcers Father   . Colon polyps Father   . Breast cancer Maternal Aunt   . Diabetes Maternal  Grandmother   . Stroke Neg Hx   . Heart disease Neg Hx   . Colon cancer Neg Hx   . Stomach cancer Neg Hx     There were no vitals taken for this visit.  Review of Systems: See HPI above.     Objective:  Physical Exam:  Gen: NAD, mildly anxious but comfortable  Right hand/wrist: No swelling, deformity.  No erythema FROM  Tenderness to palpation third digit MCP volar aspect. No thenar atrophy Tinel and Phalen test are positive Decreased sensation mostly at second and third fingers. Opposition is weak Weak OK sign. (can make an OK sign but can not maintain it) Ulnar and radial nerves motor function are full Ulnar and radial pulses are normal bilaterally  Left wrist: No swelling, deformity or erythema. No tenderness. FROM wrist and digits. Sensation is normal and motor is 5/5 Tinel and Phalen test are negative NVI distally.  Right wrist [place]: Noted to have flat, impinged Median nerve. Otherwise unremarkable.  Median nerve volume 0.10cm2  Assessment & Plan:  1. Right Carpal tunnel syndrome: Numbness and tingling for past 5 weeks, associated with weakness. Not responded well to brace and NSAID to date. Discussed steroid injection option with patient and he agrees to go ahead and get that.  -Performed steroid injection today as below:  Consent obtained and verified. Time-out conducted. Noted no overlying erythema, induration, or other signs of local infection. Skin prepped in a sterile fashion. Topical analgesic spray: Ethyl chloride. Joint/location: Rt wrist. Carpal  tunnel Needle: 25 gauge, 1.5 inch Completed without difficulty Meds: 2 mL Bupivacaine and 40mg  Depo-medrol Advised to call if fevers/chills, erythema, induration, drainage, or persistent bleeding.  -Continue using brace when sleeping at night -If no improvement, will consider NCV  -Follow up in 6 weeks.

## 2019-03-02 ENCOUNTER — Encounter: Payer: Self-pay | Admitting: Family Medicine

## 2019-03-08 ENCOUNTER — Ambulatory Visit: Payer: Medicare Other | Admitting: Family Medicine

## 2019-03-11 ENCOUNTER — Ambulatory Visit (INDEPENDENT_AMBULATORY_CARE_PROVIDER_SITE_OTHER): Payer: Medicare Other | Admitting: Family Medicine

## 2019-03-11 ENCOUNTER — Other Ambulatory Visit: Payer: Self-pay

## 2019-03-11 ENCOUNTER — Encounter: Payer: Self-pay | Admitting: Family Medicine

## 2019-03-11 VITALS — BP 124/64 | HR 112 | Ht 70.0 in | Wt 167.1 lb

## 2019-03-11 DIAGNOSIS — M25531 Pain in right wrist: Secondary | ICD-10-CM

## 2019-03-11 NOTE — Progress Notes (Signed)
     Subjective: Chief Complaint  Patient presents with  . Follow-up    Rt hand    HPI: Timothy [name] is a 53 y.o. presenting to clinic today to discuss the following:  Right Hand f/u Better post steroid injection by Dr. Barbaraann Barthel at [facility]. Patient notes vast improvement since he had steroid injection. Sensation and strength have returned to normal. He still has some "achy" residual pain in the 3rd and 4th digits of his right hand but it is much better than before.   ROS noted in HPI.   Past Medical, Surgical, Social, and Family History Reviewed & Updated per EMR.   Pertinent Historical Findings include:   Social History   Tobacco Use  Smoking Status Current Every Day Smoker  . Packs/day: 2.00  . Years: 34.00  . Pack years: 68.00  . Types: Cigarettes  . Start date: 07/22/1981  Smokeless Tobacco Former Systems developer  . Types: Chew  . Quit date: 12/12/2010  Tobacco Comment   Current 2 PPD smoker - [place]    Objective: BP 124/64   Pulse (!) 112   Ht 5\' 10"  (1.778 m)   Wt 167 lb 2 oz (75.8 kg)   SpO2 96%   BMI 23.98 kg/m  Vitals and nursing notes reviewed  Physical Exam Gen: Alert and Oriented x 3, NAD MSK: Wrist, Right: Inspection yielded no erythema, ecchymosis, bony deformity, or swelling. ROM full with good flexion and extension and ulnar/radial deviation that is symmetrical with opposite wrist. Palpation is normal over metacarpals, scaphoid, lunate, and TFCC; tendons without tenderness/swelling. Strength 5/5 in all directions without pain. Negative Finkelstein, tinel's and phalens. Ext: no clubbing, cyanosis, or edema Skin: warm, dry, intact, no rashes  Assessment/Plan:  Wrist pain Vast improvement of symptoms with corticosteroid injection making carpal tunnel diagnosis much more likely given that it had failed conservative management. - Follow up as needed - Given at home exercises to help prevent recurrence - Wear brace at night as needed   PATIENT EDUCATION  PROVIDED: See AVS    Diagnosis and plan along with any newly prescribed medication(s) were discussed in detail with this patient today. The patient verbalized understanding and agreed with the plan. Patient advised if symptoms worsen return to clinic or ER.    Harolyn Rutherford, DO 03/11/2019, 1:44 PM PGY-3 [place]

## 2019-03-11 NOTE — Patient Instructions (Signed)
Carpal Tunnel Syndrome  Carpal tunnel syndrome is a condition that causes pain in your hand and arm. The carpal tunnel is a narrow area that is on the palm side of your wrist. Repeated wrist motion or certain diseases may cause swelling in the tunnel. This swelling can pinch the main nerve in the wrist (median nerve). What are the causes? This condition may be caused by:  Repeated wrist motions.  Wrist injuries.  Arthritis.  A sac of fluid (cyst) or abnormal growth (tumor) in the carpal tunnel.  Fluid buildup during pregnancy. Sometimes the cause is not known. What increases the risk? The following factors may make you more likely to develop this condition:  Having a job in which you move your wrist in the same way many times. This includes jobs like being a butcher or a cashier.  Being a woman.  Having other health conditions, such as: ? Diabetes. ? Obesity. ? A thyroid gland that is not active enough (hypothyroidism). ? Kidney failure. What are the signs or symptoms? Symptoms of this condition include:  A tingling feeling in your fingers.  Tingling or a loss of feeling (numbness) in your hand.  Pain in your entire arm. This pain may get worse when you bend your wrist and elbow for a long time.  Pain in your wrist that goes up your arm to your shoulder.  Pain that goes down into your palm or fingers.  A weak feeling in your hands. You may find it hard to grab and hold items. You may feel worse at night. How is this diagnosed? This condition is diagnosed with a medical history and physical exam. You may also have tests, such as:  Electromyogram (EMG). This test checks the signals that the nerves send to the muscles.  Nerve conduction study. This test checks how well signals pass through your nerves.  Imaging tests, such as X-rays, ultrasound, and MRI. These tests check for what might be the cause of your condition. How is this treated? This condition may be treated  with:  Lifestyle changes. You will be asked to stop or change the activity that caused your problem.  Doing exercise and activities that make bones and muscles stronger (physical therapy).  Learning how to use your hand again (occupational therapy).  Medicines for pain and swelling (inflammation). You may have injections in your wrist.  A wrist splint.  Surgery. Follow these instructions at home: If you have a splint:  Wear the splint as told by your doctor. Remove it only as told by your doctor.  Loosen the splint if your fingers: ? Tingle. ? Lose feeling (become numb). ? Turn cold and blue.  Keep the splint clean.  If the splint is not waterproof: ? Do not let it get wet. ? Cover it with a watertight covering when you take a bath or a shower. Managing pain, stiffness, and swelling   If told, put ice on the painful area: ? If you have a removable splint, remove it as told by your doctor. ? Put ice in a plastic bag. ? Place a towel between your skin and the bag. ? Leave the ice on for 20 minutes, 2-3 times per day. General instructions  Take over-the-counter and prescription medicines only as told by your doctor.  Rest your wrist from any activity that may cause pain. If needed, talk with your boss at work about changes that can help your wrist heal.  Do any exercises as told by your doctor,   physical therapist, or occupational therapist.  Keep all follow-up visits as told by your doctor. This is important. Contact a doctor if:  You have new symptoms.  Medicine does not help your pain.  Your symptoms get worse. Get help right away if:  You have very bad numbness or tingling in your wrist or hand. Summary  Carpal tunnel syndrome is a condition that causes pain in your hand and arm.  It is often caused by repeated wrist motions.  Lifestyle changes and medicines are used to treat this problem. Surgery may help in very bad cases.  Follow your doctor's  instructions about wearing a splint, resting your wrist, keeping follow-up visits, and calling for help. This information is not intended to replace advice given to you by your health care provider. Make sure you discuss any questions you have with your health care provider. Document Released: 06/27/2011 Document Revised: 11/14/2017 Document Reviewed: 11/14/2017 Elsevier Patient Education  2020 [employer]  

## 2019-03-13 NOTE — Assessment & Plan Note (Signed)
Vast improvement of symptoms with corticosteroid injection making carpal tunnel diagnosis much more likely given that it had failed conservative management. - Follow up as needed - Given at home exercises to help prevent recurrence - Wear brace at night as needed

## 2019-04-02 ENCOUNTER — Other Ambulatory Visit: Payer: Self-pay | Admitting: Family Medicine

## 2019-04-12 ENCOUNTER — Ambulatory Visit: Payer: Medicare Other | Admitting: Family Medicine

## 2019-05-04 ENCOUNTER — Encounter: Payer: Self-pay | Admitting: Family Medicine

## 2019-05-04 ENCOUNTER — Other Ambulatory Visit: Payer: Self-pay

## 2019-05-04 ENCOUNTER — Ambulatory Visit (INDEPENDENT_AMBULATORY_CARE_PROVIDER_SITE_OTHER): Payer: Medicare Other | Admitting: Family Medicine

## 2019-05-04 VITALS — BP 120/66 | HR 113 | Wt 168.0 lb

## 2019-05-04 DIAGNOSIS — M25559 Pain in unspecified hip: Secondary | ICD-10-CM

## 2019-05-04 NOTE — Progress Notes (Signed)
   Subjective:    Marquie Perel - 53 y.o. male MRN [id number]  Date of birth: Aug 14, 1965  Patient left before office visit could be conducted.  Health Maintenance Due  Topic Date Due  . TETANUS/TDAP  06/28/2016    -  reports that he has been smoking cigarettes. He started smoking about 37 years ago. He has a 68.00 pack-year smoking history. He quit smokeless tobacco use about 8 years ago.  His smokeless tobacco use included chew. - Past Medical History: Patient Active Problem List   Diagnosis Date Noted  . Wrist pain 01/26/2019  . Chest pain 05/19/2018  . Induration of skin 02/03/2018  . Chronic obstructive pulmonary disease ([place]) 07/07/2017  . Mood disorder ([place]) 04/13/2017  . 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[place]) 03/31/2017  . Cocaine use with cocaine-induced mood disorder ([place]) 03/31/2017  . Chronic pain syndrome 02/14/2017  . PUD (peptic ulcer disease) 01/16/2017  . Chronic abdominal wound infection 08/16/2016  . Abdominal cyst 05/10/2016  . Skin ulcer of abdominal wall ([place]) 12/27/2015  . COPD exacerbation ([place]) 07/12/2015  . Erectile dysfunction 03/14/2014  . Akathisia 03/02/2013  . Ventral hernia 01/11/2013  . Migraine headache 08/23/2012  . Essential hypertension, benign 06/28/2012  . Bipolar disorder ([place]) 06/02/2012  . Insomnia 04/27/2012  . Panic anxiety syndrome 03/21/2012  . History of colostomy 03/21/2012  . History of alcohol abuse 10/29/2011  . History of diverticulitis of colon 10/16/2011  . Tobacco abuse 10/16/2011   - Medications: reviewed and updated   Objective:   Physical Exam BP 120/66   Pulse (!) 113   Wt 168 lb (76.2 kg)   SpO2 96%   BMI 24.11 kg/m       Assessment & Plan:   No problem-specific Assessment & Plan notes found for this encounter.    Maia Breslow, M.D. 05/04/2019, 11:30 AM PGY-3, [place]

## 2019-05-05 ENCOUNTER — Other Ambulatory Visit: Payer: Self-pay

## 2019-05-05 ENCOUNTER — Ambulatory Visit (INDEPENDENT_AMBULATORY_CARE_PROVIDER_SITE_OTHER): Payer: Medicare Other | Admitting: Family Medicine

## 2019-05-05 ENCOUNTER — Encounter: Payer: Self-pay | Admitting: Family Medicine

## 2019-05-05 VITALS — BP 140/80 | HR 93

## 2019-05-05 DIAGNOSIS — M533 Sacrococcygeal disorders, not elsewhere classified: Secondary | ICD-10-CM

## 2019-05-05 NOTE — Patient Instructions (Signed)
Sacroiliac Joint Dysfunction  Sacroiliac joint dysfunction is a condition that causes inflammation on one or both sides of the sacroiliac (SI) joint. The SI joint connects the lower part of the spine (sacrum) with the two upper portions of the pelvis (ilium). This condition causes deep aching or burning pain in the low back. In some cases, the pain may also spread into one or both buttocks, hips, or thighs. What are the causes? This condition may be caused by:  Pregnancy. During pregnancy, extra stress is put on the SI joints because the pelvis widens.  Injury, such as: ? Injuries from car accidents. ? Sports-related injuries. ? Work-related injuries.  Having one leg that is shorter than the other.  Conditions that affect the joints, such as: ? Rheumatoid arthritis. ? Gout. ? Psoriatic arthritis. ? Joint infection (septic arthritis). Sometimes, the cause of SI joint dysfunction is not known. What are the signs or symptoms? Symptoms of this condition include:  Aching or burning pain in the lower back. The pain may also spread to other areas, such as: ? Buttocks. ? Groin. ? Thighs.  Muscle spasms in or around the painful areas.  Increased pain when standing, walking, running, stair climbing, bending, or lifting. How is this diagnosed? This condition is diagnosed with a physical exam and medical history. During the exam, the health care provider may move one or both of your legs to different positions to check for pain. Various tests may be done to confirm the diagnosis, including:  Imaging tests to look for other causes of pain. These may include: ? MRI. ? CT scan. ? Bone scan.  Diagnostic injection. A numbing medicine is injected into the SI joint using a needle. If your pain is temporarily improved or stopped after the injection, this can indicate that SI joint dysfunction is the problem. How is this treated? Treatment depends on the cause and severity of your condition.  Treatment options may include:  Ice or heat applied to the lower back area after an injury. This may help reduce pain and muscle spasms.  Medicines to relieve pain or inflammation or to relax the muscles.  Wearing a back brace (sacroiliac brace) to help support the joint while your back is healing.  Physical therapy to increase muscle strength around the joint and flexibility at the joint. This may also involve learning proper body positions and ways of moving to relieve stress on the joint.  Direct manipulation of the SI joint.  Injections of steroid medicine into the joint to reduce pain and swelling.  Radiofrequency ablation to burn away nerves that are carrying pain messages from the joint.  Use of a device that provides electrical stimulation to help reduce pain at the joint.  Surgery to put in screws and plates that limit or prevent joint motion. This is rare. Follow these instructions at home: Medicines  Take over-the-counter and prescription medicines only as told by your health care provider.  Do not drive or use heavy machinery while taking prescription pain medicine.  If you are taking prescription pain medicine, take actions to prevent or treat constipation. Your health care provider may recommend that you: ? Drink enough fluid to keep your urine pale yellow. ? Eat foods that are high in fiber, such as fresh fruits and vegetables, whole grains, and beans. ? Limit foods that are high in fat and processed sugars, such as fried or sweet foods. ? Take an over-the-counter or prescription medicine for constipation. If you have a brace:    Wear the brace as told by your health care provider. Remove it only as told by your health care provider.  Keep the brace clean.  If the brace is not waterproof: ? Do not let it get wet. ? Cover it with a watertight covering when you take a bath or a shower. Managing pain, stiffness, and swelling      Icing can help with pain and  swelling. Heat may help with muscle tension or spasms. Ask your health care provider if you should use ice or heat.  If directed, put ice on the affected area: ? If you have a removable brace, remove it as told by your health care provider. ? Put ice in a plastic bag. ? Place a towel between your skin and the bag. ? Leave the ice on for 20 minutes, 2-3 times a day.  If directed, apply heat to the affected area. Use the heat source that your health care provider recommends, such as a moist heat pack or a heating pad. ? Place a towel between your skin and the heat source. ? Leave the heat on for 20-30 minutes. ? Remove the heat if your skin turns bright red. This is especially important if you are unable to feel pain, heat, or cold. You may have a greater risk of getting burned. General instructions  Rest as needed. Ask your health care provider what activities are safe for you.  Return to your normal activities as told by your health care provider.  Exercise as directed by your health care provider or physical therapist.  Do not use any products that contain nicotine or tobacco, such as cigarettes and e-cigarettes. These can delay bone healing. If you need help quitting, ask your health care provider.  Keep all follow-up visits as told by your health care provider. This is important. Contact a health care provider if:  Your pain is not controlled with medicine.  You have a fever.  Your pain is getting worse. Get help right away if:  You have weakness, numbness, or tingling in your legs or feet.  You lose control of your bladder or bowel. Summary  Sacroiliac joint dysfunction is a condition that causes inflammation on one or both sides of the sacroiliac (SI) joint.  This condition causes deep aching or burning pain in the low back. In some cases, the pain may also spread into one or both buttocks, hips, or thighs.  Treatment depends on the cause and severity of your condition.  It may include medicines to reduce pain and swelling or to relax muscles. This information is not intended to replace advice given to you by your health care provider. Make sure you discuss any questions you have with your health care provider. Document Released: 10/04/2008 Document Revised: 03/04/2018 Document Reviewed: 08/18/2017 Elsevier Patient Education  2020 Elsevier Inc.  

## 2019-05-05 NOTE — Progress Notes (Signed)
     Subjective: Chief Complaint  Patient presents with  . left hip pain    HPI: Timothy [name] is a 53 y.o. presenting to clinic today to discuss the following:  Left Hip Pain Patient states he had sudden onset left hip pain that is located at the left lower back/SI joint. It has been going on for a week, no trauma, falls, or injury. He states it "locks up on him" and "feels like it catches" when he moves his legs. Difficult to sleep laying on his left side or back. Pain intermittently radiates down his buttocks and back leg. No groin numbness, no loss of bowel or bladder function, no unsteadiness. Patient states he had this exact same pain years ago and "got an injection" and it has not given him problems since last week.  ROS noted in HPI.    Social History   Tobacco Use  Smoking Status Current Every Day Smoker  . Packs/day: 2.00  . Years: 34.00  . Pack years: 68.00  . Types: Cigarettes  . Start date: 07/22/1981  Smokeless Tobacco Former Systems developer  . Types: Chew  . Quit date: 12/12/2010  Tobacco Comment   Current 2 PPD smoker - [place]    Objective: BP 140/80   Pulse 93   SpO2 98%  Vitals and nursing notes reviewed  Physical Exam Hip, Bilateral: TTP noted at left SI Joint. No obvious rash, erythema, ecchymosis, or edema. ROM full in all directions (IR: 80/ ER: 80/Flex: 120/Ext: 100/Abd: 45/Add: 45); Strength 5/5 in IR/ER/Flex/Ext/Abd/Add. Pelvic alignment unremarkable to inspection and palpation. Greater trochanter without tenderness to palpation. No tenderness over piriformis. SI joint tenderness on the left and normal minimal SI movement. Negative FABER and FADIR testing, negative straight leg raise teste  Assessment/Plan:  SI (sacroiliac) joint dysfunction Patient reports significant pain with movement with radiation down the back and left leg intermittently. Due to history of having similar symptoms with relief on SI joint injection will refer to [facility] for  ultrasound guided SI joint injection - SI joint injection with Dr. Barbaraann Barthel at 10:45am at [facility] on 05/06/2019   PATIENT EDUCATION PROVIDED: See AVS    Diagnosis and plan along with any newly prescribed medication(s) were discussed in detail with this patient today. The patient verbalized understanding and agreed with the plan. Patient advised if symptoms worsen return to clinic or ER.    Harolyn Rutherford, DO 05/05/2019, 2:12 PM PGY-3 [place]

## 2019-05-05 NOTE — Assessment & Plan Note (Signed)
Patient reports significant pain with movement with radiation down the back and left leg intermittently. Due to history of having similar symptoms with relief on SI joint injection will refer to [facility] for ultrasound guided SI joint injection - SI joint injection with Dr. Barbaraann Barthel at 10:45am at [facility] on 05/06/2019

## 2019-05-06 ENCOUNTER — Ambulatory Visit (INDEPENDENT_AMBULATORY_CARE_PROVIDER_SITE_OTHER): Payer: Medicare Other | Admitting: Family Medicine

## 2019-05-06 ENCOUNTER — Ambulatory Visit: Payer: Self-pay

## 2019-05-06 VITALS — BP 138/86 | Ht 70.0 in | Wt 168.0 lb

## 2019-05-06 DIAGNOSIS — M533 Sacrococcygeal disorders, not elsewhere classified: Secondary | ICD-10-CM | POA: Diagnosis present

## 2019-05-06 DIAGNOSIS — G8929 Other chronic pain: Secondary | ICD-10-CM | POA: Diagnosis not present

## 2019-05-06 MED ORDER — METHYLPREDNISOLONE ACETATE 40 MG/ML IJ SUSP
40.0000 mg | Freq: Once | INTRAMUSCULAR | Status: AC
  Administered 2019-05-06: 40 mg via INTRA_ARTICULAR

## 2019-05-07 ENCOUNTER — Telehealth: Payer: Self-pay | Admitting: Family Medicine

## 2019-05-07 ENCOUNTER — Encounter: Payer: Self-pay | Admitting: Family Medicine

## 2019-05-07 NOTE — Telephone Encounter (Signed)
Patient forgot to ask for a script for Ibuprofen (800mg  if possible) for pain in his hip.

## 2019-05-07 NOTE — Progress Notes (Signed)
PCP and consultation requested by: Nuala Alpha, DO  Subjective:   HPI: Patient is a 53 y.o. male here for left SI joint injection.  Patient reports he's had about a week of left posterior low back pain similar to pain he had about 4 years ago that resolved with an SI joint injection. No radiation past the knee. No numbness/tingling. Worse with movement.  Past Medical History:  Diagnosis Date  . Acute bronchitis 02/15/2013  . Alcohol abuse   . Anxiety   . Bipolar disorder ([place])   . Bronchitis    history  . Bronchitis   . Cellulitis    - left knee-2009  . Chronic pain   . Condyloma 02/04/2012  . Depression   . Diverticulosis    By colonoscopy June 2005  . Dyspnea    on exertion at times  . Elevated CK 3/13  . Emphysema lung ([place])   . Emphysema of lung ([place])   . Family history of anesthesia complication    Mother N/V  . GERD (gastroesophageal reflux disease)   . Glaucoma syndrome    does not use eye drops, "They burn".  [place])    "alot; not daily" (07/23/2012)  . Heavy smoker   . Hemorrhoids   . History of dizziness   . History of stomach ulcers 2005  . HPV (human papilloma virus) infection   . Hypertension    started 3 months ago  . Hypoglycemia   . Incisional hernia   . Lower GI bleed    June 2005. Presumably secondary to diverticulosis.  . Migraines    "not often" (07/23/2012)    Current Outpatient Medications on File Prior to Visit  Medication Sig Dispense Refill  . albuterol (VENTOLIN HFA) 108 (90 Base) MCG/ACT inhaler INHALE 2 PUFFS INTO THE LUNGS EVERY 6 HOURS AS NEEDED FOR WHEEZING OR SHORTNESS OF BREATH 54 g 2  . busPIRone (BUSPAR) 15 MG tablet Take 1 tablet (15 mg total) by mouth at bedtime. 90 tablet 3  . cyclobenzaprine (FLEXERIL) 10 MG tablet TAKE 1 TABLET BY MOUTH EVERY DAY AS NEEDED FOR MUSCLE SPASMS 30 tablet 2  . ibuprofen (ADVIL) 600 MG tablet TAKE 1 TABLET(600 MG) BY MOUTH EVERY 8 HOURS AS NEEDED 30 tablet 0  . losartan (COZAAR)  100 MG tablet TAKE ONE TABLET BY MOUTH DAILY TAKE 1 TABLET BY MOUTH EVERY DAY 90 tablet 3  . lurasidone (LATUDA) 40 MG TABS tablet TAKE 1 TABLET(40 MG) BY MOUTH DAILY WITH BREAKFAST 30 tablet 0  . mometasone-formoterol (DULERA) 100-5 MCG/ACT AERO Inhale 2 puffs into the lungs 2 (two) times daily. 13 g 2  . pantoprazole (PROTONIX) 40 MG tablet Take 1 tablet (40 mg total) by mouth 2 (two) times daily. 60 tablet 3  . tiotropium (SPIRIVA HANDIHALER) 18 MCG inhalation capsule Place 1 capsule (18 mcg total) into inhaler and inhale 2 (two) times daily. 30 capsule 12   No current facility-administered medications on file prior to visit.     Past Surgical History:  Procedure Laterality Date  . ABDOMINAL EXPLORATION SURGERY  07/23/2012   w/LOA (07/23/2012)  . APPENDECTOMY  1982  . COLON RESECTION  02/27/2012   Procedure: COLON RESECTION;  Surgeon: Merrie Roof, MD;  Location: [place];  Service: General;  Laterality: N/A;  . COLOSTOMY  02/27/2012   Procedure: COLOSTOMY;  Surgeon: Merrie Roof, MD;  Location: [place];  Service: General;  Laterality: N/A;  . COLOSTOMY TAKEDOWN  07/23/2012  . COLOSTOMY  TAKEDOWN  07/23/2012   Procedure: COLOSTOMY TAKEDOWN;  Surgeon: Merrie Roof, MD;  Location: [place];  Service: General;  Laterality: N/A;  Primary Anastomosis  . ELBOW FRACTURE SURGERY  ~ 1975   left;  pins inserted   . INSERTION OF MESH N/A 05/20/2013   Procedure: INSERTION OF MESH;  Surgeon: Merrie Roof, MD;  Location: [place];  Service: General;  Laterality: N/A;  . LAPAROSCOPIC LEFT COLON RESECTION  02/19/2012   SIGMOID  . LAPAROTOMY  02/27/2012   Procedure: EXPLORATORY LAPAROTOMY;  Surgeon: Merrie Roof, MD;  Location: [place];  Service: General;  Laterality: N/A;  . LAPAROTOMY  07/23/2012   Procedure: EXPLORATORY LAPAROTOMY;  Surgeon: Merrie Roof, MD;  Location: [place];  Service: General;  Laterality: N/A;  . LESION DESTRUCTION  07/23/2012   Procedure: DESTRUCTION LESION ANUS;  Surgeon: Merrie Roof,  MD;  Location: [place];  Service: General;  Laterality: N/A;  [place]  . LYSIS OF ADHESION  07/23/2012   Procedure: LYSIS OF ADHESION;  Surgeon: Merrie Roof, MD;  Location: [place];  Service: General;  Laterality: N/A;  . VENTRAL HERNIA REPAIR  05/20/2013   Dr Marlou Starks  . VENTRAL HERNIA REPAIR N/A 05/20/2013   Procedure: VENTRAL HERNIA REPAIR ;  Surgeon: Merrie Roof, MD;  Location: [place];  Service: General;  Laterality: N/A;  . WART FULGURATION  02/19/2012   Procedure: FULGURATION ANAL WART;  Surgeon: Merrie Roof, MD;  Location: [place];  Service: General;  Laterality: N/A;  Destroy  Anal Condyloma   . WISDOM TOOTH EXTRACTION  ~ 1983  . WOUND DEBRIDEMENT N/A 06/03/2016   Procedure: ABDOMINAL WOUND EXPLORATION AND STITCH REMOVAL;  Surgeon: [name], MD;  Location: [place];  Service: General;  Laterality: N/A;  ABDOMINAL WOUND EXPLORATION AND STITCH REMOVAL    Allergies  Allergen Reactions  . Ambien [Zolpidem Tartrate] Other (See Comments)    Caused agitation of mood.   [name] [Propoxyphene N-Acetaminophen] Hives and Itching  . Morphine And Related Itching  . Bactroban [Mupirocin Calcium] Swelling    Caused infection at a surgical site  . Lisinopril Cough    Social History   Socioeconomic History  . Marital status: Legally Separated    Spouse name: Not on file  . Number of children: 3  . Years of education: Not on file  . Highest education level: Not on file  Occupational History  . Occupation: unemployed  Social Needs  . Financial resource strain: Not on file  . Food insecurity    Worry: Not on file    Inability: Not on file  . Transportation needs    Medical: Not on file    Non-medical: Not on file  Tobacco Use  . Smoking status: Current Every Day Smoker    Packs/day: 2.00    Years: 34.00    Pack years: 68.00    Types: Cigarettes    Start date: 07/22/1981  . Smokeless tobacco: Former Systems developer    Types: Chew    Quit date:  12/12/2010  . Tobacco comment: Current 2 PPD smoker - [place]  Substance and Sexual Activity  . Alcohol use: No    Alcohol/week: 0.0 standard drinks  . Drug use: Yes    Types: Oxycodone  . Sexual activity: Not on file  Lifestyle  . Physical activity    Days per week: Not on file  Minutes per session: Not on file  . Stress: Not on file  Relationships  . Social Herbalist on phone: Not on file    Gets together: Not on file    Attends religious service: Not on file    Active member of club or organization: Not on file    Attends meetings of clubs or organizations: Not on file    Relationship status: Not on file  . Intimate partner violence    Fear of current or ex partner: Not on file    Emotionally abused: Not on file    Physically abused: Not on file    Forced sexual activity: Not on file  Other Topics Concern  . Not on file  Social History Narrative   Lives with wife [place], Children Evander (age 70) Sterling Big (age 31) [name] (age 25).  Unemployed less than high school education.  Updated 10/29/11    Family History  Problem Relation Age of Onset  . Diabetes Mother   . Parkinsonism Mother   . Depression Mother   . Alcohol abuse Father   . Hypertension Father   . Anxiety disorder Father   . Ulcers Father   . Colon polyps Father   . Breast cancer Maternal Aunt   . Diabetes Maternal Grandmother   . Stroke Neg Hx   . Heart disease Neg Hx   . Colon cancer Neg Hx   . Stomach cancer Neg Hx     BP 138/86   Ht 5\' 10"  (1.778 m)   Wt 168 lb (76.2 kg)   BMI 24.11 kg/m   Review of Systems: See HPI above.     Objective:  Physical Exam:  Gen: NAD, comfortable in exam room  Back: No deformity. FROM with 5/5 strength lower extremities. TTP left SI joint.  No other tenderness. NVI distally. Negative logroll bilateral hips. Negative straight leg raises Negative faber and fadir.  Assessment & Plan:  1. Left sacroiliac dysfunction - localized pain to  left SI joint though has negative faber.  Done well with injection previously so repeated this today.  Shown home stretches to do daily.  F/u prn.  After informed written consent timeout was performed.  Patient was lying prone on exam table.  Area overlying left SI joint prepped with alcohol swab and utilizing ultrasound guidance patient's left SI joint was injected with 4:1 bupivicaine: depomedrol.  Patient tolerated procedure well without immediate complications.

## 2019-05-10 ENCOUNTER — Other Ambulatory Visit: Payer: Self-pay | Admitting: Family Medicine

## 2019-05-10 MED ORDER — IBUPROFEN 600 MG PO TABS: ORAL_TABLET | ORAL | 0 refills | Status: DC

## 2019-05-10 NOTE — Progress Notes (Signed)
Patient requested refill Sent to pharmacy.  

## 2019-05-11 ENCOUNTER — Other Ambulatory Visit: Payer: Self-pay

## 2019-05-12 MED ORDER — LURASIDONE HCL 40 MG PO TABS: ORAL_TABLET | ORAL | 0 refills | Status: DC

## 2019-05-18 ENCOUNTER — Ambulatory Visit (INDEPENDENT_AMBULATORY_CARE_PROVIDER_SITE_OTHER): Payer: Medicare Other | Admitting: Family Medicine

## 2019-05-18 ENCOUNTER — Other Ambulatory Visit: Payer: Self-pay

## 2019-05-18 ENCOUNTER — Ambulatory Visit
Admission: RE | Admit: 2019-05-18 | Discharge: 2019-05-18 | Disposition: A | Discharge status: Home / Self Care | Payer: Medicare Other | Source: Ambulatory Visit | Attending: Family Medicine | Admitting: Family Medicine

## 2019-05-18 VITALS — BP 136/102 | HR 99 | Wt 168.0 lb

## 2019-05-18 DIAGNOSIS — K436 Other and unspecified ventral hernia with obstruction, without gangrene: Secondary | ICD-10-CM

## 2019-05-18 DIAGNOSIS — M533 Sacrococcygeal disorders, not elsewhere classified: Secondary | ICD-10-CM

## 2019-05-18 DIAGNOSIS — M7989 Other specified soft tissue disorders: Secondary | ICD-10-CM

## 2019-05-18 MED ORDER — IOPAMIDOL (ISOVUE-300) INJECTION 61%
100.0000 mL | Freq: Once | INTRAVENOUS | Status: AC | PRN
  Administered 2019-05-18: 15:00:00 100 mL via INTRAVENOUS

## 2019-05-18 MED ORDER — DOXYCYCLINE HYCLATE 100 MG PO TABS: 100.0000 mg | ORAL_TABLET | Freq: Two times a day (BID) | ORAL | 0 refills | Status: DC

## 2019-05-18 NOTE — Progress Notes (Signed)
Subjective:    Patient ID: Timothy [name], male    DOB: 1965-12-27, 53 y.o.   MRN: [id number]   CC: " Knot on stomach and hip shot not working"   HPI: Mr. Cooling is a 53 year old gentleman with hypertension, COPD, bipolar with alcohol/cocaine use, s/p sigmoid colectomy and ventral hernia repair in 2014 presenting discuss the following:  Knot on stomach at previous surgical site: First noticed on Friday, 10/23, suddenly.  It is been persistent since that time. Has been putting heat/cold packs on it. Seeing his surgeon next week for it.  Area is very tender, hurts when it is touched, however does not hurt as much if nothing is on it.  The knot is hard.  It was hurting so bad on Saturday that he got very nauseous and vomited.  Has not noticed any overlying skin changes.  Had a similar occurrence of this back in July 2019, concern for incarcerated abdominal hernia at that time and sent to the ED.  He is nervous because while it was a similar presentation, it was not nearly as big as it is today.  CT abdomen/pelvis performed showing soft tissue swelling and localized cellulitis (no hernia seen), sent home with doxycycline for 10 days.  Had resolution with this.  Does have a history of numerous abdominal surgeries in the past with colectomy, last in 2017, and does have history of ventral hernia repair with subsequent mesh repair at the top of his incision site.  Bowel movements have been normal and regular.  Denies any fever, abdominal pain elsewhere, further nausea/vomiting.  Left sacroiliac dysfunction: Recently had an SI joint injection on 10/15, feels this is not effective.  Has not been doing any exercises provided.  Smoking status reviewed  Review of Systems Per HPI    Objective:  BP (!) 136/102   Pulse 99   Wt 168 lb (76.2 kg)   SpO2 99%   BMI 24.11 kg/m  Vitals and nursing note reviewed  General: NAD, pleasant Respiratory: Unlabored breathing Abdomen: soft, nontender, nondistended  Skin: warm and dry, approximate 5X6 round hard indurated mass centrally located in suprapubic region within previous incision site, overlying inflammation.  Exquisitely tender with palpation.  Nonfluctuant, inability to reduce. MSK: No bony abnormalities/deformity seen.  Tenderness along left SI joint space with preserved ROM and 5/5 muscle strength.  Negative Corky Sox on the left. Neuro: alert and oriented, no focal deficits Psych: normal affect      Assessment & Plan:   Abdominal cyst 5-day history of rapid onset exquisitely tender, hard lower abdomen mass within previous surgical incision site.  He had a similar occurrence in 01/2018 with concerns for an incarcerated hernia, however was found to be an abdominal phlegmon/cyst/cellulitis that responded well to oral antibiotics.  While I suspect this is a recurrence of the same especially with mild overlying inflammation, continue to have concern for incarcerated ventral hernia given location/symptomatology and believe this should still be ruled out. Reassuringly no other abdominal complaints with normal BM and no signs of systemic infection.  Discussed presenting to ED for stat imaging to rule out hernia, however despite discussing risks, patient declines further evaluation in the ED at this time and would like to proceed with outpatient imaging. -Stat CT abdomen/pelvis, scheduled for 2:40 pm this afternoon to rule out incarcerated hernia -Doxycycline 100 mg twice daily for 10 days -Recommend keeping scheduled follow-up with his general surgeon -Tylenol 650 mg every 4-6 hours as needed  SI (sacroiliac) joint dysfunction Persistent,  recent SI joint injection ineffective for his pain.  Highly encouraged him to start exercises provided to him to recently to improve function and continue Tylenol/ibuprofen as needed.   Follow-up if not improving or sooner if worsening, will follow closely with results of CT abdomen.  [place]  Medicine Resident PGY-2

## 2019-05-18 NOTE — Assessment & Plan Note (Signed)
Persistent, recent SI joint injection ineffective for his pain.  Highly encouraged him to start exercises provided to him to recently to improve function and continue Tylenol/ibuprofen as needed.

## 2019-05-18 NOTE — Assessment & Plan Note (Addendum)
5-day history of rapid onset exquisitely tender, hard lower abdomen mass within previous surgical incision site.  He had a similar occurrence in 01/2018 with concerns for an incarcerated hernia, however was found to be an abdominal phlegmon/cyst/cellulitis that responded well to oral antibiotics.  While I suspect this is a recurrence of the same especially with mild overlying inflammation, continue to have concern for incarcerated ventral hernia given location/symptomatology and believe this should still be ruled out. Reassuringly no other abdominal complaints with normal BM and no signs of systemic infection.  Discussed presenting to ED for stat imaging to rule out hernia, however despite discussing risks, patient declines further evaluation in the ED at this time and would like to proceed with outpatient imaging. -Stat CT abdomen/pelvis, scheduled for 2:40 pm this afternoon to rule out incarcerated hernia -Doxycycline 100 mg twice daily for 10 days -Recommend keeping scheduled follow-up with his general surgeon -Tylenol 650 mg every 4-6 hours as needed

## 2019-05-18 NOTE — Patient Instructions (Addendum)
It was wonderful meeting you today.  For the knot on your stomach, we are going to get an ultrasound to get a better look at this large bump.  Additionally, I will send in an antibiotic, doxycycline, to help as well.  Please make sure you follow-up with your surgeon.   I recommend you start doing the hip exercises to help with your hip pain as well.  You may use Tylenol 650 mg/ibuprofen 400 mg every 4-6 hours as needed.

## 2019-05-31 ENCOUNTER — Telehealth: Payer: Self-pay | Admitting: Family Medicine

## 2019-05-31 NOTE — Telephone Encounter (Signed)
Call patient to check on status.  He was able to see his general surgeon last week on 11/5. Says his suprapubic "knot" is much improved but still present.  He was prescribed another round of doxycycline and will be having a follow-up CT scan in 2 weeks per his surgeon, Dr. Marlou Starks.  Surgical intervention pending repeat scan.  Still having SI joint pains.  Encouraged him to continue doing daily exercises to strengthen surrounding musculature.  He will touch base if he continues to have concerns over the next few weeks.  Patriciaann Clan, DO  Family Medicine PGY-2

## 2019-06-01 ENCOUNTER — Other Ambulatory Visit: Payer: Self-pay | Admitting: General Surgery

## 2019-06-01 DIAGNOSIS — R1030 Lower abdominal pain, unspecified: Secondary | ICD-10-CM

## 2019-06-11 ENCOUNTER — Other Ambulatory Visit: Payer: Medicare Other

## 2019-06-16 ENCOUNTER — Other Ambulatory Visit: Payer: Self-pay | Admitting: *Deleted

## 2019-06-20 MED ORDER — CYCLOBENZAPRINE HCL 10 MG PO TABS: ORAL_TABLET | ORAL | 2 refills | Status: DC

## 2019-07-05 ENCOUNTER — Other Ambulatory Visit: Payer: Self-pay

## 2019-07-05 ENCOUNTER — Ambulatory Visit (INDEPENDENT_AMBULATORY_CARE_PROVIDER_SITE_OTHER): Payer: Medicare Other | Admitting: Family Medicine

## 2019-07-05 ENCOUNTER — Encounter: Payer: Self-pay | Admitting: Family Medicine

## 2019-07-05 VITALS — BP 122/88 | HR 115 | Wt 169.6 lb

## 2019-07-05 DIAGNOSIS — M5442 Lumbago with sciatica, left side: Secondary | ICD-10-CM | POA: Diagnosis present

## 2019-07-05 MED ORDER — GABAPENTIN 100 MG PO CAPS: 100.0000 mg | ORAL_CAPSULE | Freq: Every day | ORAL | 0 refills | Status: DC

## 2019-07-05 MED ORDER — CYCLOBENZAPRINE HCL 10 MG PO TABS: ORAL_TABLET | ORAL | 2 refills | Status: DC

## 2019-07-05 NOTE — Progress Notes (Signed)
Subjective: Chief Complaint  Patient presents with  . Hip Pain     HPI: Timothy [name] is a 53 y.o. presenting to clinic today to discuss the following:  Hip Pain Patient is still experiencing low back/hip pain now for two months. He did go to Sports Medicine and get an injection into his SI joint as I originally thought this was the source of his pain. He states the shot worked for "about 4-5 days" and then the pain returned. Just last week he began experiencing numbness and tingling going down his left leg all the way to his foot. No difficulty urinating or pain on urination, no fevers, chills, swelling, or skin changes to the low back. He is still able to walk and do ADLs. He states the pain is more constant now rated at a 4/10.     ROS noted in HPI.    Social History   Tobacco Use  Smoking Status Current Every Day Smoker  . Packs/day: 2.00  . Years: 34.00  . Pack years: 68.00  . Types: Cigarettes  . Start date: 07/22/1981  Smokeless Tobacco Former Systems developer  . Types: Chew  . Quit date: 12/12/2010  Tobacco Comment   Current 2 PPD smoker - [place]    Objective: BP 122/88   Pulse (!) 115   Wt 169 lb 9.6 oz (76.9 kg)   SpO2 99%   BMI 24.34 kg/m  Vitals and nursing notes reviewed  Physical Exam Lumbar spine: - Inspection: no gross deformity or asymmetry, swelling or ecchymosis. No skin changes - Palpation: No TTP over the spinous processes, paraspinal muscles, or SI joints b/l - ROM: limited active ROM of the lumbar spine in flexion and extension without pain - Strength: 5/5 strength of lower extremity in L4-S1 nerve root distributions b/l - Neuro: sensation intact in the L4-S1 nerve root distribution b/l, 2+ L4 and S1 reflexes - Special testing: Negative straight leg raise bilaterally, negative FADIR and FABER testing bilaterally  Assessment/Plan:  Acute left-sided low back pain with left-sided sciatica Patient continues to have low back pain now with  sciatica. Could be pinched nerve from lumbar spine vs sciatic nerve impingement vs acute muscle sprain/strain. I now am more inclined to see this is some kind of sciatic nerve impingement given his lingering symptoms and given he had no response to the SI joint injection. - Obtain lumbar x-rays to rule out any acute fracture or osseous abnormality. If pain continues for another month I will proceed to MRI. - Cont to do sciatica stretches - Gabapentin 100mg  QHS - Cont Flexeril 10mg  PRN along with Tylenol and Ibuprofen PRN  PATIENT EDUCATION PROVIDED: See AVS    Diagnosis and plan along with any newly prescribed medication(s) were discussed in detail with this patient today. The patient verbalized understanding and agreed with the plan. Patient advised if symptoms worsen return to clinic or ER.    Orders Placed This Encounter  Procedures  . DG Lumbar Spine Complete    Standing Status:   Future    Standing Expiration Date:   09/04/2020    Order Specific Question:   Reason for Exam (SYMPTOM  OR DIAGNOSIS REQUIRED)    Answer:   low back pain    Order Specific Question:   Preferred imaging location?    Answer:   [id number]    Order Specific Question:   Radiology Contrast Protocol - do NOT remove file path    Answer:   [email or web address]  Meds ordered this encounter  Medications  . cyclobenzaprine (FLEXERIL) 10 MG tablet    Sig: TAKE 1 TABLET BY MOUTH EVERY DAY AS NEEDED FOR MUSCLE SPASMS    Dispense:  30 tablet    Refill:  2  . gabapentin (NEURONTIN) 100 MG capsule    Sig: Take 1 capsule (100 mg total) by mouth at bedtime.    Dispense:  90 capsule    Refill:  0     Harolyn Rutherford, DO 07/05/2019, 2:31 PM PGY-3 [place]

## 2019-07-06 ENCOUNTER — Ambulatory Visit
Admission: RE | Admit: 2019-07-06 | Discharge: 2019-07-06 | Disposition: A | Discharge status: Home / Self Care | Payer: Medicare Other | Source: Ambulatory Visit | Attending: Family Medicine | Admitting: Family Medicine

## 2019-07-06 DIAGNOSIS — M5442 Lumbago with sciatica, left side: Secondary | ICD-10-CM | POA: Insufficient documentation

## 2019-07-06 NOTE — Patient Instructions (Signed)
Sciatica ° °Sciatica is pain, weakness, tingling, or loss of feeling (numbness) along the sciatic nerve. The sciatic nerve starts in the lower back and goes down the back of each leg. Sciatica usually goes away on its own or with treatment. Sometimes, sciatica may come back (recur). °What are the causes? °This condition happens when the sciatic nerve is pinched or has pressure put on it. This may be the result of: °· A disk in between the bones of the spine bulging out too far (herniated disk). °· Changes in the spinal disks that occur with aging. °· A condition that affects a muscle in the butt. °· Extra bone growth near the sciatic nerve. °· A break (fracture) of the area between your hip bones (pelvis). °· Pregnancy. °· Tumor. This is rare. °What increases the risk? °You are more likely to develop this condition if you: °· Play sports that put pressure or stress on the spine. °· Have poor strength and ease of movement (flexibility). °· Have had a back injury in the past. °· Have had back surgery. °· Sit for long periods of time. °· Do activities that involve bending or lifting over and over again. °· Are very overweight (obese). °What are the signs or symptoms? °Symptoms can vary from mild to very bad. They may include: °· Any of these problems in the lower back, leg, hip, or butt: °? Mild tingling, loss of feeling, or dull aches. °? Burning sensations. °? Sharp pains. °· Loss of feeling in the back of the calf or the sole of the foot. °· Leg weakness. °· Very bad back pain that makes it hard to move. °These symptoms may get worse when you cough, sneeze, or laugh. They may also get worse when you sit or stand for long periods of time. °How is this treated? °This condition often gets better without any treatment. However, treatment may include: °· Changing or cutting back on physical activity when you have pain. °· Doing exercises and stretching. °· Putting ice or heat on the affected area. °· Medicines that  help: °? To relieve pain and swelling. °? To relax your muscles. °· Shots (injections) of medicines that help to relieve pain, irritation, and swelling. °· Surgery. °Follow these instructions at home: °Medicines °· Take over-the-counter and prescription medicines only as told by your doctor. °· Ask your doctor if the medicine prescribed to you: °? Requires you to avoid driving or using heavy machinery. °? Can cause trouble pooping (constipation). You may need to take these steps to prevent or treat trouble pooping: °§ Drink enough fluids to keep your pee (urine) pale yellow. °§ Take over-the-counter or prescription medicines. °§ Eat foods that are high in fiber. These include beans, whole grains, and fresh fruits and vegetables. °§ Limit foods that are high in fat and sugar. These include fried or sweet foods. °Managing pain ° °  ° °· If told, put ice on the affected area. °? Put ice in a plastic bag. °? Place a towel between your skin and the bag. °? Leave the ice on for 20 minutes, 2-3 times a day. °· If told, put heat on the affected area. Use the heat source that your doctor tells you to use, such as a moist heat pack or a heating pad. °? Place a towel between your skin and the heat source. °? Leave the heat on for 20-30 minutes. °? Remove the heat if your skin turns bright red. This is very important if you are   unable to feel pain, heat, or cold. You may have a greater risk of getting burned. °Activity ° °· Return to your normal activities as told by your doctor. Ask your doctor what activities are safe for you. °· Avoid activities that make your symptoms worse. °· Take short rests during the day. °? When you rest for a long time, do some physical activity or stretching between periods of rest. °? Avoid sitting for a long time without moving. Get up and move around at least one time each hour. °· Exercise and stretch regularly, as told by your doctor. °· Do not lift anything that is heavier than 10 lb (4.5 kg)  while you have symptoms of sciatica. °? Avoid lifting heavy things even when you do not have symptoms. °? Avoid lifting heavy things over and over. °· When you lift objects, always lift in a way that is safe for your body. To do this, you should: °? Bend your knees. °? Keep the object close to your body. °? Avoid twisting. °General instructions °· Stay at a healthy weight. °· Wear comfortable shoes that support your feet. Avoid wearing high heels. °· Avoid sleeping on a mattress that is too soft or too hard. You might have less pain if you sleep on a mattress that is firm enough to support your back. °· Keep all follow-up visits as told by your doctor. This is important. °Contact a doctor if: °· You have pain that: °? Wakes you up when you are sleeping. °? Gets worse when you lie down. °? Is worse than the pain you have had in the past. °? Lasts longer than 4 weeks. °· You lose weight without trying. °Get help right away if: °· You cannot control when you pee (urinate) or poop (have a bowel movement). °· You have weakness in any of these areas and it gets worse: °? Lower back. °? The area between your hip bones. °? Butt. °? Legs. °· You have redness or swelling of your back. °· You have a burning feeling when you pee. °Summary °· Sciatica is pain, weakness, tingling, or loss of feeling (numbness) along the sciatic nerve. °· This condition happens when the sciatic nerve is pinched or has pressure put on it. °· Sciatica can cause pain, tingling, or loss of feeling (numbness) in the lower back, legs, hips, and butt. °· Treatment often includes rest, exercise, medicines, and putting ice or heat on the affected area. °This information is not intended to replace advice given to you by your health care provider. Make sure you discuss any questions you have with your health care provider. °Document Released: 04/16/2008 Document Revised: 07/27/2018 Document Reviewed: 07/27/2018 °Elsevier Patient Education © 2020 [employer]  Inc[employer] ° °

## 2019-07-06 NOTE — Assessment & Plan Note (Signed)
Patient continues to have low back pain now with sciatica. Could be pinched nerve from lumbar spine vs sciatic nerve impingement vs acute muscle sprain/strain. I now am more inclined to see this is some kind of sciatic nerve impingement given his lingering symptoms and given he had no response to the SI joint injection. - Obtain lumbar x-rays to rule out any acute fracture or osseous abnormality. If pain continues for another month I will proceed to MRI. - Cont to do sciatica stretches - Gabapentin 100mg  QHS - Cont Flexeril 10mg  PRN along with Tylenol and Ibuprofen PRN

## 2019-08-20 ENCOUNTER — Other Ambulatory Visit: Payer: Self-pay

## 2019-08-20 MED ORDER — GABAPENTIN 100 MG PO CAPS: 100.0000 mg | ORAL_CAPSULE | Freq: Every day | ORAL | 0 refills | Status: DC

## 2019-11-08 ENCOUNTER — Other Ambulatory Visit: Payer: Self-pay

## 2019-11-08 DIAGNOSIS — R1084 Generalized abdominal pain: Secondary | ICD-10-CM

## 2019-11-08 DIAGNOSIS — K219 Gastro-esophageal reflux disease without esophagitis: Secondary | ICD-10-CM

## 2019-11-08 DIAGNOSIS — R112 Nausea with vomiting, unspecified: Secondary | ICD-10-CM

## 2019-11-09 MED ORDER — BUSPIRONE HCL 15 MG PO TABS: 15.0000 mg | ORAL_TABLET | Freq: Every day | ORAL | 3 refills | Status: DC

## 2019-11-09 MED ORDER — PANTOPRAZOLE SODIUM 40 MG PO TBEC: 40.0000 mg | DELAYED_RELEASE_TABLET | Freq: Two times a day (BID) | ORAL | 3 refills | Status: DC

## 2019-11-10 ENCOUNTER — Ambulatory Visit: Payer: Medicare Other | Admitting: Family Medicine

## 2019-12-16 ENCOUNTER — Other Ambulatory Visit: Payer: Self-pay | Admitting: Family Medicine

## 2020-02-25 ENCOUNTER — Ambulatory Visit: Payer: Medicare Other | Admitting: Family Medicine

## 2020-02-28 ENCOUNTER — Other Ambulatory Visit: Payer: Self-pay

## 2020-02-28 DIAGNOSIS — J441 Chronic obstructive pulmonary disease with (acute) exacerbation: Secondary | ICD-10-CM

## 2020-02-28 DIAGNOSIS — R0602 Shortness of breath: Secondary | ICD-10-CM

## 2020-02-28 MED ORDER — LOSARTAN POTASSIUM 100 MG PO TABS: ORAL_TABLET | ORAL | 3 refills | Status: DC

## 2020-02-28 MED ORDER — ALBUTEROL SULFATE HFA 108 (90 BASE) MCG/ACT IN AERS: INHALATION_SPRAY | RESPIRATORY_TRACT | 2 refills | Status: DC

## 2020-02-29 ENCOUNTER — Ambulatory Visit: Payer: Medicare Other | Admitting: Family Medicine

## 2020-03-10 ENCOUNTER — Ambulatory Visit (INDEPENDENT_AMBULATORY_CARE_PROVIDER_SITE_OTHER): Payer: Medicare Other | Admitting: Family Medicine

## 2020-03-10 ENCOUNTER — Other Ambulatory Visit: Payer: Self-pay

## 2020-03-10 ENCOUNTER — Encounter: Payer: Self-pay | Admitting: Family Medicine

## 2020-03-10 VITALS — BP 106/52 | HR 115 | Ht 70.0 in | Wt 168.0 lb

## 2020-03-10 DIAGNOSIS — G894 Chronic pain syndrome: Secondary | ICD-10-CM | POA: Diagnosis not present

## 2020-03-10 DIAGNOSIS — M7989 Other specified soft tissue disorders: Secondary | ICD-10-CM

## 2020-03-10 DIAGNOSIS — Z1159 Encounter for screening for other viral diseases: Secondary | ICD-10-CM | POA: Diagnosis present

## 2020-03-10 MED ORDER — GABAPENTIN 100 MG PO CAPS: 100.0000 mg | ORAL_CAPSULE | Freq: Two times a day (BID) | ORAL | 3 refills | Status: DC

## 2020-03-10 MED ORDER — CYCLOBENZAPRINE HCL 10 MG PO TABS: ORAL_TABLET | ORAL | 2 refills | Status: DC

## 2020-03-10 MED ORDER — CEPHALEXIN 500 MG PO CAPS: 500.0000 mg | ORAL_CAPSULE | Freq: Two times a day (BID) | ORAL | 0 refills | Status: DC

## 2020-03-10 NOTE — Assessment & Plan Note (Signed)
Regarding his chronic abdominal and right wrist pain, he takes gabapentin 100 mg daily and Flexeril 10 mg daily. -Increase gabapentin to 100 mg twice daily -Refilled Flexeril 10 mg daily

## 2020-03-10 NOTE — Progress Notes (Signed)
° ° °  SUBJECTIVE:   CHIEF COMPLAINT / HPI:   Pubic nodule Mr. Zeringue reports that he has had a recurrence of a painful pubic nodule.  This is now roughly the fifth recurrence of this painful nodule/mass since 2019.  He reports that he has a history significant for multiple abdominal surgeries with mesh placement.  He was last seen for this issue in 04/2019.  At that time, an abdominal CT was performed which showed small fluid collection and no evidence of incarcerated hernia.  Following discussions with his surgeon, he was given a 10-day course of doxycycline and showed significant improvement.  Today, he reports that this feels exactly like his previous episode was hoping you could fix it with a course of antibiotics.  He denies fevers, chills, nausea.  Abdominal pain, chronic As result of his multiple abdominal surgeries, he has a history of chronic abdominal pain which he treats with Flexeril.  He notes that he does not need this every day but occasionally needs as many as 2 doses per day.  As result, he sometimes runs out of his 30 pill supply before the end of the month.  He would like to know if he can have more Flexeril.  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 Abdomen soft and non-tender.  He did have a 1.5-2 cm nodule noted just superior to his pubic symphysis on the left side.  There were no evident skin changes to this area but it was mildly tender to palpation. Extremities: No peripheral edema. Warm/ well perfused.  Strong radial pulse. Neuro: Cranial nerves grossly intact   ASSESSMENT/PLAN:   Abdominal cyst Discussed with Dr. Wilfrid Lund.  Very low  suspicion for hernia.  Previous imaging is shown a fluid collection when he is presented with similar symptoms.  This seems very likely to be related to his previous abdominal surgeries and mesh placement.  If this improves with antibiotics, there seems to be no need to move forward with surgery involvement.  If this does not improve with antibiotics, we will send to surgery again.  In the name of antibiotic stewardship, we will first try to treat this with a course of Keflex.  If it does not improve with Keflex we will try doxycycline. -Keflex 500 mg twice daily 10 days  Chronic pain syndrome Regarding his chronic abdominal and right wrist pain, he takes gabapentin 100 mg daily and Flexeril 10 mg daily. -Increase gabapentin to 100 mg twice daily -Refilled Flexeril 10 mg daily     Matilde Haymaker, MD [place]

## 2020-03-10 NOTE — Patient Instructions (Signed)
Soft tissue infection: I think for now, we can plan to treat you with antibiotics.  If you get better on antibiotics, you do not need to see a Psychologist, sport and exercise.  If you do not get better on antibiotics, I think it is necessary for you to see your surgeon again.  For now, I would like to start with Keflex, a different antibiotic.  If you do not get better with Keflex, I can switch to doxycycline.  Stomach pain: We will continue with your current medication.  Memory concerns: Please come back to clinic in the next week or 2 and we can spend more time discussing her memory.

## 2020-03-10 NOTE — Assessment & Plan Note (Signed)
Discussed with Dr. Wilfrid Lund.  Very low suspicion for hernia.  Previous imaging is shown a fluid collection when he is presented with similar symptoms.  This seems very likely to be related to his previous abdominal surgeries and mesh placement.  If this improves with antibiotics, there seems to be no need to move forward with surgery involvement.  If this does not improve with antibiotics, we will send to surgery again.  In the name of antibiotic stewardship, we will first try to treat this with a course of Keflex.  If it does not improve with Keflex we will try doxycycline. -Keflex 500 mg twice daily 10 days

## 2020-03-24 ENCOUNTER — Telehealth: Payer: Self-pay | Admitting: Family Medicine

## 2020-03-24 ENCOUNTER — Ambulatory Visit (INDEPENDENT_AMBULATORY_CARE_PROVIDER_SITE_OTHER): Payer: Medicare Other | Admitting: Family Medicine

## 2020-03-24 ENCOUNTER — Encounter: Payer: Self-pay | Admitting: Family Medicine

## 2020-03-24 ENCOUNTER — Other Ambulatory Visit: Payer: Self-pay

## 2020-03-24 ENCOUNTER — Other Ambulatory Visit: Payer: Self-pay | Admitting: Family Medicine

## 2020-03-24 VITALS — BP 112/70 | HR 127 | Temp 99.1°F | Ht 70.0 in | Wt 166.1 lb

## 2020-03-24 DIAGNOSIS — K625 Hemorrhage of anus and rectum: Secondary | ICD-10-CM | POA: Insufficient documentation

## 2020-03-24 DIAGNOSIS — R112 Nausea with vomiting, unspecified: Secondary | ICD-10-CM

## 2020-03-24 DIAGNOSIS — M7989 Other specified soft tissue disorders: Secondary | ICD-10-CM | POA: Diagnosis not present

## 2020-03-24 DIAGNOSIS — K921 Melena: Secondary | ICD-10-CM | POA: Diagnosis present

## 2020-03-24 LAB — POCT HEMOGLOBIN: Hemoglobin: 15.1 g/dL — AB (ref 11–14.6)

## 2020-03-24 MED ORDER — DOXYCYCLINE HYCLATE 100 MG PO TABS: 100.0000 mg | ORAL_TABLET | Freq: Two times a day (BID) | ORAL | 0 refills | Status: DC

## 2020-03-24 MED ORDER — ONDANSETRON 4 MG PO TBDP: 4.0000 mg | ORAL_TABLET | Freq: Three times a day (TID) | ORAL | 0 refills | Status: DC | PRN

## 2020-03-24 MED ORDER — BUSPIRONE HCL 15 MG PO TABS: 15.0000 mg | ORAL_TABLET | Freq: Every day | ORAL | 3 refills | Status: DC

## 2020-03-24 NOTE — Assessment & Plan Note (Signed)
The differential includes diverticular bleed, diverticulitis with bleeding.  Lower suspicion for upper GI bleed based on lack of epigastric or periumbilical pain, no hematemesis.  Point-of-care hemoglobin in the office was over 15.  Blood pressure within normal limits.  Elevated pulses likely related to anxiety.  He noted during our conversation that he knew he would have to go to the emergency room if his symptoms did not improve. -Follow-up CBC, BMP -Reduce diet to clear liquids today, soft foods tomorrow and regular food the next day -Follow-up in clinic in 1 week -Present to the ED for worsened symptoms

## 2020-03-24 NOTE — Patient Instructions (Signed)
Diverticular bleed/diverticulitis: Based on your history and physical exam, your bloody bowel movements appeared to most likely be a diverticular bleed or from diverticulitis.  Lets pull back on your diet and onl take clear liquids today (like broths and drinks).  Progressed to soft foods tomorrow and a normal diet the day after that.  If your symptoms worsen and you have worsening abdominal pain or increased bloody bowel movements, go to the ED for further evaluation.  Pubic lump/discomfort: We will do a 10-day course of doxycycline.  If this does not improve your pubic bump or discomfort, we may have to send you back to your surgeon.  These antibiotics may also help with your abdominal pain.  Lets follow-up in 1 week to make sure you are improving and to discuss her memory.

## 2020-03-24 NOTE — Telephone Encounter (Signed)
**  After Hours Call**  Patient seen by Dr. Pilar Plate today in clinic for abdominal cyst, and likely diverticular bleed vs diverticulitis. Patient has had frank blood in his stool since last night, when he had ~10 BMs. Patient notes that the blood fills the bowl, +/- fecal matter. He has had nausea and vomiting 4x since leaving clinic today, emesis NBNB, just food material. This is similar to other episodes of diverticular bleeds he has had in the past. Hgb in clinic today 15, patient denies fever, chills, palpitations, dizziness or lightheadedness. BP stable today, tachycardic to 127, but noted in chart that this was due to anxiety. He is calling because he and Dr. Pilar Plate had discussed, but ultimately held off, on prescribing zofran. Since the patient has had multiple bouts of emesis this evening, he would like a zofran rx. He has no h/o QTc prolongation, last EKG in 2019 with normal QTc. He was prescribed doxy today by Dr. Pilar Plate for abdominal cyst. He reports that he understands if he has any symptoms of dizziness, palpitations, syncope, fever, chills, bleeding increases, pain starts, or cannot keep fluids down with zofran, that he should go to the ED for evaluation. Zofran 4mg  ODT x20, NR sent electronically to his preferred pharmacy. He notes that he has an appt with GI on 9/10.  Gladys Damme, MD [facility], PGY-2

## 2020-03-24 NOTE — Progress Notes (Signed)
    SUBJECTIVE:   CHIEF COMPLAINT / HPI:   Likely diverticular bleed Timothy [name] was planning to come to clinic today to follow-up on concerns regarding his memory but he has had some acute medical issues starting last night.  Last night, he started having mild left lower abdominal pain and Timothy [name] blood in his stool.  Since last night, he has had roughly 10 bloody bowel movements.  These are bowel movements with bright red blood in the toilet bowl, sometimes without the presence of stool.  He denies melena.  He has had mild nausea and 2 episodes of vomiting without bloody emesis.  He brought photos of his bowel movements that demonstrate Timothy [name] blood in the toilet bowl.  He had to excuse himself in clinic twice for urgent bowel movements which turned out to be small bloody bowel movements.  He reports that his is very similar to previous issue with a diverticular bleed.  He is currently taking Protonix.  Abdominal cyst When he was last seen in clinic, we prescribed him a course of Keflex to treat lower abdominal pain thought to be related to his previous extensive history abdominal surgeries.  He completed this course without any significant relief in the lower abdominal pain.  This abdominal pain seems to be separate and apart from his current GI bleed.  The agreed-upon plan was to treat him with a course of doxycycline if the Keflex was ineffective.  PERTINENT  PMH / PSH: Multiple abdominal surgeries, previous colectomy (now repaired), history of diverticulosis.  OBJECTIVE:   BP 112/70   Pulse (!) 127   Temp 99.1 F (37.3 C) (Oral)   Ht 5\' 10"  (1.778 m)   Wt 166 lb 2 oz (75.4 kg)   SpO2 96%   BMI 23.84 kg/m    General: Alert and cooperative and appears to be in no acute distress.  He excused himself from the visit twice to go to the bathroom where he had a bloody bowel movement. Cardio: Normal S1 and S2, no S3 or S4. Rhythm is regular. No murmurs or rubs.   Pulm: Clear to auscultation  bilaterally, no crackles, wheezing, or diminished breath sounds. Normal respiratory effort Abdomen: Bowel sounds normal. Abdomen soft and non-tender.  Extremities: No peripheral edema. Warm/ well perfused.  Strong radial pulse.   ASSESSMENT/PLAN:   Bright red blood per rectum The differential includes diverticular bleed, diverticulitis with bleeding.  Lower suspicion for upper GI bleed based on lack of epigastric or periumbilical pain, no hematemesis.  Point-of-care hemoglobin in the office was over 15.  Blood pressure within normal limits.  Elevated pulses likely related to anxiety.  He noted during our conversation that he knew he would have to go to the emergency room if his symptoms did not improve. -Follow-up CBC, BMP -Reduce diet to clear liquids today, soft foods tomorrow and regular food the next day -Follow-up in clinic in 1 week -Present to the ED for worsened symptoms  Abdominal cyst No significant improvement after completing his course of Keflex -Doxy 100 mg twice for 10 days daily prescribed -We will refer back to his surgeon if this does not improve his symptoms     Timothy Haymaker, MD [place]

## 2020-03-24 NOTE — Assessment & Plan Note (Signed)
No significant improvement after completing his course of Keflex -Doxy 100 mg twice for 10 days daily prescribed -We will refer back to his surgeon if this does not improve his symptoms

## 2020-03-25 LAB — CBC
Hematocrit: 44.8 % (ref 37.5–51.0)
Hemoglobin: 15.6 g/dL (ref 13.0–17.7)
MCH: 32.1 pg (ref 26.6–33.0)
MCHC: 34.8 g/dL (ref 31.5–35.7)
MCV: 92 fL (ref 79–97)
Platelets: 394 10*3/uL (ref 150–450)
RBC: 4.86 x10E6/uL (ref 4.14–5.80)
RDW: 12.2 % (ref 11.6–15.4)
WBC: 10.6 10*3/uL (ref 3.4–10.8)

## 2020-03-25 LAB — BASIC METABOLIC PANEL
BUN/Creatinine Ratio: 16 (ref 9–20)
BUN: 22 mg/dL (ref 6–24)
CO2: 19 mmol/L — ABNORMAL LOW (ref 20–29)
Calcium: 9.9 mg/dL (ref 8.7–10.2)
Chloride: 101 mmol/L (ref 96–106)
Creatinine, Ser: 1.41 mg/dL — ABNORMAL HIGH (ref 0.76–1.27)
GFR calc Af Amer: 66 mL/min/{1.73_m2} (ref >59)
GFR calc non Af Amer: 57 mL/min/{1.73_m2} — ABNORMAL LOW (ref >59)
Glucose: 115 mg/dL — ABNORMAL HIGH (ref 65–99)
Potassium: 4.6 mmol/L (ref 3.5–5.2)
Sodium: 138 mmol/L (ref 134–144)

## 2020-03-31 ENCOUNTER — Ambulatory Visit: Payer: Medicare Other | Admitting: Nurse Practitioner

## 2020-04-05 ENCOUNTER — Ambulatory Visit: Payer: Medicare Other | Admitting: Family Medicine

## 2020-05-08 ENCOUNTER — Telehealth: Payer: Self-pay | Admitting: Family Medicine

## 2020-05-08 ENCOUNTER — Encounter (HOSPITAL_COMMUNITY): Payer: Self-pay

## 2020-05-08 ENCOUNTER — Other Ambulatory Visit: Payer: Self-pay

## 2020-05-08 ENCOUNTER — Ambulatory Visit (INDEPENDENT_AMBULATORY_CARE_PROVIDER_SITE_OTHER): Payer: Medicare Other

## 2020-05-08 ENCOUNTER — Ambulatory Visit (HOSPITAL_COMMUNITY)
Admission: EM | Admit: 2020-05-08 | Discharge: 2020-05-08 | Disposition: A | Discharge status: Home / Self Care | Payer: Medicare Other | Attending: Family Medicine | Admitting: Family Medicine

## 2020-05-08 DIAGNOSIS — Z20822 Contact with and (suspected) exposure to covid-19: Secondary | ICD-10-CM | POA: Diagnosis not present

## 2020-05-08 DIAGNOSIS — R0600 Dyspnea, unspecified: Secondary | ICD-10-CM

## 2020-05-08 DIAGNOSIS — I10 Essential (primary) hypertension: Secondary | ICD-10-CM | POA: Insufficient documentation

## 2020-05-08 DIAGNOSIS — R5383 Other fatigue: Secondary | ICD-10-CM | POA: Diagnosis not present

## 2020-05-08 DIAGNOSIS — J449 Chronic obstructive pulmonary disease, unspecified: Secondary | ICD-10-CM | POA: Insufficient documentation

## 2020-05-08 DIAGNOSIS — R0689 Other abnormalities of breathing: Secondary | ICD-10-CM

## 2020-05-08 LAB — SARS CORONAVIRUS 2 (TAT 6-24 HRS): SARS Coronavirus 2: NEGATIVE

## 2020-05-08 MED ORDER — AZITHROMYCIN 250 MG PO TABS: 250.0000 mg | ORAL_TABLET | Freq: Every day | ORAL | 0 refills | Status: DC

## 2020-05-08 MED ORDER — PREDNISONE 20 MG PO TABS: 20.0000 mg | ORAL_TABLET | Freq: Two times a day (BID) | ORAL | 0 refills | Status: DC

## 2020-05-08 NOTE — Telephone Encounter (Signed)
**  After Hours/ Emergency Line Call**  Received a call to report that Timothy [name] requests to speak with a doctor.   Patient states Saturday he started to feel bad.  Endorses chest pain, shortness of breath, myalgias, sore throat,  loss of taste and headache.  Feels like he can hardly breathe and has a "chest cold." Feels like he may have fever but does not have a thermometer. Patient is not COVID vaccinated.  Recommended that pt be evaluated at ED but he declined. He prefers to go to urgent care instead.  Red flags discussed.  Will forward to PCP.  Lyndee Hensen, DO PGY-2, [facility] 05/08/2020 7:41 AM

## 2020-05-08 NOTE — Discharge Instructions (Addendum)
Go home to rest Drink plenty of fluids Take Tylenol or aleve/advil for pain or fever You may take over-the-counter cough and cold medicines as needed You must quarantine at home until your test result is available You can check for your test result in MyChart I have prescribed the z pak and prednisone to your pharmacy [place] X RAY FOLLOW UP

## 2020-05-08 NOTE — ED Notes (Signed)
Pt vital signs and condition reported to charge nurse Verdis Frederickson.

## 2020-05-08 NOTE — ED Triage Notes (Signed)
Pt reports fatigue, headache,  body aches and dry cough, dizziness  x 3 days; loss of taste x 1 day. Pt reports he has COPD and he is used to the shortness of breath but since yesterday the sob is worsens when he talks, walks. Pt feels dizzy when moving fast or changing positions. Pt not taking medications for the complaints.   Pt states he do not want the COVID vaccine as her mom had the vaccine and was at the hospital for 30 days.

## 2020-05-08 NOTE — ED Provider Notes (Signed)
[facility]    CSN: [id number] Arrival date & time: 05/08/20  1334      History   Chief Complaint Chief Complaint  Patient presents with  . Shortness of Breath  . Fatigue  . Generalized Body Aches    HPI Timothy [name] is a 54 y.o. male.   HPI  Patient is here for shortness of breath.  He has known COPD.  He is an ongoing cigarette smoker.  He feels like he has a flare of his COPD.  He states his doctor always treats it with "a Z-Pak and some steroids".  He has no fever or chills although he does have significant shortness of breath.  Headache.  Body aches.  Loss of taste. He did not get Covid vaccination.  He states "I have not heard anything good about them". He states he is coughing up green and brown sputum.  He is using his inhalers.  Last inhaler use was 11:00 this morning.  He has a nebulizer at home.  Past Medical History:  Diagnosis Date  . Acute bronchitis 02/15/2013  . Alcohol abuse   . Anxiety   . Bipolar disorder ([place])   . Bronchitis    history  . Bronchitis   . Cellulitis    - left knee-2009  . Chronic pain   . Condyloma 02/04/2012  . Depression   . Diverticulosis    By colonoscopy June 2005  . Dyspnea    on exertion at times  . Elevated CK 3/13  . Emphysema lung ([place])   . Emphysema of lung ([place])   . Family history of anesthesia complication    Mother N/V  . GERD (gastroesophageal reflux disease)   . Glaucoma syndrome    does not use eye drops, "They burn".  L[place])    "alot; not daily" (07/23/2012)  . Heavy smoker   . Hemorrhoids   . History of dizziness   . History of stomach ulcers 2005  . HPV (human papilloma virus) infection   . Hypertension    started 3 months ago  . Hypoglycemia   . Incisional hernia   . Lower GI bleed    June 2005. Presumably secondary to diverticulosis.  . Migraines    "not often" (07/23/2012)    Patient Active Problem List   Diagnosis Date Noted  . Bright red blood per rectum 03/24/2020  .  Acute left-sided low back pain with left-sided sciatica 07/06/2019  . Wrist pain 01/26/2019  . Chest pain 05/19/2018  . Induration of skin 02/03/2018  . Chronic obstructive pulmonary disease ([place]) 07/07/2017  . Mood disorder ([place]) 04/13/2017  . 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[place]) 03/31/2017  . Cocaine use with cocaine-induced mood disorder ([place]) 03/31/2017  . Chronic pain syndrome 02/14/2017  . PUD (peptic ulcer disease) 01/16/2017  . Chronic abdominal wound infection 08/16/2016  . Abdominal cyst 05/10/2016  . Skin ulcer of abdominal wall ([place]) 12/27/2015  . COPD exacerbation ([place]) 07/12/2015  . 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 Erectile dysfunction 03/14/2014  . Akathisia 03/02/2013  . Ventral hernia 01/11/2013  . Migraine headache 08/23/2012  . Essential hypertension, benign 06/28/2012  . Bipolar disorder ([place]) 06/02/2012  . Insomnia 04/27/2012  . Panic anxiety syndrome 03/21/2012  . History of colostomy 03/21/2012  . History of alcohol abuse 10/29/2011  . History of diverticulitis of colon 10/16/2011  . Tobacco abuse 10/16/2011    Past Surgical History:  Procedure Laterality Date  . ABDOMINAL EXPLORATION SURGERY  07/23/2012   w/LOA (07/23/2012)  . APPENDECTOMY  1982  . COLON RESECTION  02/27/2012   Procedure: COLON RESECTION;  Surgeon: Merrie Roof, MD;  Location: [facility];  Service: General;  Laterality: N/A;  . COLOSTOMY  02/27/2012   Procedure: COLOSTOMY;  Surgeon: Merrie Roof, MD;  Location: [facility];  Service: General;  Laterality: N/A;  . COLOSTOMY TAKEDOWN  07/23/2012  . COLOSTOMY TAKEDOWN  07/23/2012   Procedure: COLOSTOMY TAKEDOWN;  Surgeon: Merrie Roof, MD;  Location: [facility];  Service: General;  Laterality: N/A;  Primary Anastomosis  . ELBOW FRACTURE SURGERY  ~ 1975   left;  pins inserted   . INSERTION OF MESH N/A 05/20/2013   Procedure: INSERTION OF MESH;  Surgeon: Merrie Roof, MD;  Location: [facility];  Service: General;  Laterality:  N/A;  . LAPAROSCOPIC LEFT COLON RESECTION  02/19/2012   SIGMOID  . LAPAROTOMY  02/27/2012   Procedure: EXPLORATORY LAPAROTOMY;  Surgeon: Merrie Roof, MD;  Location: [facility];  Service: General;  Laterality: N/A;  . LAPAROTOMY  07/23/2012   Procedure: EXPLORATORY LAPAROTOMY;  Surgeon: Merrie Roof, MD;  Location: [facility];  Service: General;  Laterality: N/A;  . LESION DESTRUCTION  07/23/2012   Procedure: DESTRUCTION LESION ANUS;  Surgeon: Merrie Roof, MD;  Location: [facility];  Service: General;  Laterality: N/A;  [place]  . LYSIS OF ADHESION  07/23/2012   Procedure: LYSIS OF ADHESION;  Surgeon: Merrie Roof, MD;  Location: [facility];  Service: General;  Laterality: N/A;  . VENTRAL HERNIA REPAIR  05/20/2013   Dr Marlou Starks  . VENTRAL HERNIA REPAIR N/A 05/20/2013   Procedure: VENTRAL HERNIA REPAIR ;  Surgeon: Merrie Roof, MD;  Location: [facility];  Service: General;  Laterality: N/A;  . WART FULGURATION  02/19/2012   Procedure: FULGURATION ANAL WART;  Surgeon: Merrie Roof, MD;  Location: [facility];  Service: General;  Laterality: N/A;  Destroy  Anal Condyloma   . WISDOM TOOTH EXTRACTION  ~ 1983  . WOUND DEBRIDEMENT N/A 06/03/2016   Procedure: ABDOMINAL WOUND EXPLORATION AND STITCH REMOVAL;  Surgeon: [name], MD;  Location: [facility];  Service: General;  Laterality: N/A;  ABDOMINAL WOUND EXPLORATION AND STITCH REMOVAL       Home Medications    Prior to Admission medications   Medication Sig Start Date End Date Taking? Authorizing Provider  albuterol (VENTOLIN HFA) 108 (90 Base) MCG/ACT inhaler INHALE 2 PUFFS INTO THE LUNGS EVERY 6 HOURS AS NEEDED FOR WHEEZING OR SHORTNESS OF BREATH 02/28/20   Matilde Haymaker, MD  azithromycin (ZITHROMAX) 250 MG tablet Take 1 tablet (250 mg total) by mouth daily. Take first 2 tablets together, then 1 every day until finished. 05/08/20   Raylene Everts, MD  busPIRone (BUSPAR) 15 MG tablet Take 1 tablet (15 mg total) by mouth at  bedtime. 03/24/20   Matilde Haymaker, MD  cyclobenzaprine (FLEXERIL) 10 MG tablet TAKE 1 TABLET BY MOUTH EVERY DAY AS NEEDED FOR MUSCLE SPASMS 03/10/20   Matilde Haymaker, MD  gabapentin (NEURONTIN) 100 MG capsule Take 1 capsule (100 mg total) by mouth 2 (two) times daily. 03/10/20   Matilde Haymaker, MD  losartan (COZAAR) 100 MG tablet TAKE ONE TABLET BY MOUTH DAILY TAKE 1 TABLET BY MOUTH EVERY DAY 02/28/20   Matilde Haymaker, MD  mometasone-formoterol [facility]) 100-5 MCG/ACT AERO Inhale 2 puffs into the lungs 2 (  two) times daily. 02/22/19   Nuala Alpha, DO  pantoprazole (PROTONIX) 40 MG tablet Take 1 tablet (40 mg total) by mouth 2 (two) times daily. 11/09/19   Nuala Alpha, DO  predniSONE (DELTASONE) 20 MG tablet Take 1 tablet (20 mg total) by mouth 2 (two) times daily with a meal. 05/08/20   Raylene Everts, MD  tiotropium (SPIRIVA HANDIHALER) 18 MCG inhalation capsule Place 1 capsule (18 mcg total) into inhaler and inhale 2 (two) times daily. 02/22/19   Nuala Alpha, DO  lurasidone (LATUDA) 40 MG TABS tablet TAKE 1 TABLET(40 MG) BY MOUTH DAILY WITH BREAKFAST 05/12/19 05/08/20  Nuala Alpha, DO    Family History Family History  Problem Relation Age of Onset  . Diabetes Mother   . Parkinsonism Mother   . Depression Mother   . Alcohol abuse Father   . Hypertension Father   . Anxiety disorder Father   . Ulcers Father   . Colon polyps Father   . Breast cancer Maternal Aunt   . Diabetes Maternal Grandmother   . Stroke Neg Hx   . Heart disease Neg Hx   . Colon cancer Neg Hx   . Stomach cancer Neg Hx     Social History Social History   Tobacco Use  . Smoking status: Current Every Day Smoker    Packs/day: 2.00    Years: 34.00    Pack years: 68.00    Types: Cigarettes    Start date: 07/22/1981  . Smokeless tobacco: Former Systems developer    Types: Chew    Quit date: 12/12/2010  . Tobacco comment: Current 2 PPD smoker - [place]  Substance Use Topics  . Alcohol use: No    Alcohol/week: 0.0  standard drinks  . Drug use: Yes    Types: Oxycodone     Allergies   Ambien [zolpidem tartrate], Bactroban [mupirocin calcium], Darvocet [propoxyphene n-acetaminophen], Lisinopril, and Morphine and related   Review of Systems Review of Systems See HPI  Physical Exam Triage Vital Signs ED Triage Vitals  Enc Vitals Group     BP 05/08/20 1458 (!) 162/107     Pulse Rate 05/08/20 1458 97     Resp 05/08/20 1458 (!) 24     Temp 05/08/20 1458 98.9 F (37.2 C)     Temp Source 05/08/20 1458 Oral     SpO2 05/08/20 1458 96 %     Weight --      Height --      Head Circumference --      Peak Flow --      Pain Score 05/08/20 1455 6     Pain Loc --      Pain Edu? --      Excl. in [place]? --    No data found.  Updated Vital Signs BP (!) 162/107 (BP Location: Left Arm)   Pulse 97   Temp 98.9 F (37.2 C) (Oral)   Resp (!) 24   SpO2 96%     Physical Exam Constitutional:      General: He is in acute distress.     Appearance: He is well-developed. He is ill-appearing.     Comments: Short of breath.  Cannot complete sentences.  Slightly pale.  HENT:     Head: Normocephalic and atraumatic.     Right Ear: Tympanic membrane and ear canal normal.     Left Ear: Tympanic membrane normal.     Nose: Nose normal. No congestion.     Mouth/Throat:  Mouth: Mucous membranes are moist.     Pharynx: No posterior oropharyngeal erythema.  Eyes:     Conjunctiva/sclera: Conjunctivae normal.     Pupils: Pupils are equal, round, and reactive to light.  Cardiovascular:     Rate and Rhythm: Normal rate and regular rhythm.     Heart sounds: Normal heart sounds.  Pulmonary:     Effort: Pulmonary effort is normal.     Breath sounds: Wheezing present.     Comments: Tachypnea.  Scattered wheeze throughout Abdominal:     Palpations: Abdomen is soft.  Musculoskeletal:        General: Normal range of motion.     Cervical back: Normal range of motion and neck supple.  Lymphadenopathy:     Cervical:  No cervical adenopathy.  Skin:    General: Skin is warm and dry.  Neurological:     Mental Status: He is alert.  Psychiatric:        Mood and Affect: Mood normal.        Behavior: Behavior normal.      UC Treatments / Results  Labs (all labs ordered are listed, but only abnormal results are displayed) Labs Reviewed  SARS CORONAVIRUS 2 (TAT 6-24 HRS)    EKG   Radiology DG Chest 2 View  Result Date: 05/08/2020 CLINICAL DATA:  Dyspnea and fatigue EXAM: CHEST - 2 VIEW COMPARISON:  10/03/2014 FINDINGS: Cardiac shadow is within normal limits. The lungs are well aerated bilaterally. On the lateral projection there is a somewhat rounded density identified superimposed over the midthoracic spine which is not well appreciated on the frontal film. No other focal abnormality is noted. IMPRESSION: Rounded density superimposed over the midthoracic spine on the lateral projection. This is new from the prior exam. CT of the chest with contrast is recommended for further evaluation. Electronically Signed   By: Inez Catalina M.D.   On: 05/08/2020 15:28    Procedures Procedures (including critical care time)  Medications Ordered in UC Medications - No data to display  Initial Impression / Assessment and Plan / UC Course  I have reviewed the triage vital signs and the nursing notes.  Pertinent labs & imaging results that were available during my care of the patient were reviewed by me and considered in my medical decision making (see chart for details).    I gave patient a Xerox copy of his chest x-ray.  Discussed the report.  Stressed the importance of follow-up. Recommended patient to quit smoking Recommended patient have Covid vaccinations Discussed quarantine until test results are available Treated patient for COPD exacerbation   Final Clinical Impressions(s) / UC Diagnoses   Final diagnoses:  Chronic obstructive pulmonary disease, unspecified COPD type ([place])  Essential  hypertension  Dyspnea and respiratory abnormality     Discharge Instructions     Go home to rest Drink plenty of fluids Take Tylenol or aleve/advil for pain or fever You may take over-the-counter cough and cold medicines as needed You must quarantine at home until your test result is available You can check for your test result in MyChart I have prescribed the z pak and prednisone to your pharmacy [place] X RAY FOLLOW UP     ED Prescriptions    Medication Sig Dispense Auth. Provider   predniSONE (DELTASONE) 20 MG tablet Take 1 tablet (20 mg total) by mouth 2 (two) times daily with a meal. 10 tablet Raylene Everts, MD  azithromycin (ZITHROMAX) 250 MG tablet Take 1 tablet (250 mg total) by mouth daily. Take first 2 tablets together, then 1 every day until finished. 6 tablet Raylene Everts, MD     PDMP not reviewed this encounter.   Raylene Everts, MD 05/08/20 [phone]

## 2020-05-10 ENCOUNTER — Telehealth: Payer: Self-pay | Admitting: Family Medicine

## 2020-05-10 NOTE — Telephone Encounter (Signed)
Urgent care is wanting him to get referral from doctor for a CT, patient said he thinks they seen something on his lungs. Please advise patient. Thanks

## 2020-05-19 ENCOUNTER — Other Ambulatory Visit: Payer: Self-pay

## 2020-05-19 ENCOUNTER — Ambulatory Visit (INDEPENDENT_AMBULATORY_CARE_PROVIDER_SITE_OTHER): Payer: Medicare Other | Admitting: Family Medicine

## 2020-05-19 ENCOUNTER — Encounter: Payer: Self-pay | Admitting: Family Medicine

## 2020-05-19 ENCOUNTER — Telehealth: Payer: Self-pay

## 2020-05-19 DIAGNOSIS — R918 Other nonspecific abnormal finding of lung field: Secondary | ICD-10-CM | POA: Diagnosis present

## 2020-05-19 DIAGNOSIS — J441 Chronic obstructive pulmonary disease with (acute) exacerbation: Secondary | ICD-10-CM | POA: Diagnosis not present

## 2020-05-19 DIAGNOSIS — R911 Solitary pulmonary nodule: Secondary | ICD-10-CM | POA: Insufficient documentation

## 2020-05-19 MED ORDER — ALBUTEROL SULFATE (2.5 MG/3ML) 0.083% IN NEBU: 2.5000 mg | INHALATION_SOLUTION | Freq: Four times a day (QID) | RESPIRATORY_TRACT | 12 refills | Status: DC | PRN

## 2020-05-19 MED ORDER — BREO ELLIPTA 100-25 MCG/INH IN AEPB: 1.0000 | INHALATION_SPRAY | Freq: Every day | RESPIRATORY_TRACT | 2 refills | Status: DC

## 2020-05-19 NOTE — Patient Instructions (Signed)
Lung nodule: I have ordered a CT to follow-up on that lung nodule for further assessment of it.  We will get that appointment scheduled for you before you leave the clinic today.  Likely Covid infection: I am sorry to hear that you are so sick.  I am glad you are improving nicely.  Lets follow-up in 1 month to take a listen to your lungs to make sure your breathing is significantly improving.  Please return to clinic if you are having any worsening symptoms or have new concerns.  COPD: I refilled one of your medications called Dulera which I recommend that you start taking daily at least for the next month so that we can reassess you in 1 month to see how you are doing.  I have also refilled your albuterol nebulizers.

## 2020-05-19 NOTE — Telephone Encounter (Signed)
Attempted to reach pt. No answer. LVM of CT appt at [facility]. Nov 11th at 12:40pm. 12:20 check in time. Salvatore Marvel, CMA

## 2020-05-19 NOTE — Assessment & Plan Note (Signed)
-  Follow-up CT chest with contrast

## 2020-05-19 NOTE — Progress Notes (Signed)
° ° °  SUBJECTIVE:   CHIEF COMPLAINT / HPI:   Urgent care follow-up Timothy [name] reports that he began feeling ill 1.5-2 weeks ago at which time he was seen at urgent care.  During his work-up at urgent care, his tested negative for Covid and had a chest x-ray performed.  This chest x-ray was notable for a mass in his upper lobe and there is recommendation that he have additional imaging with CT chest with contrast.  Since his initial illness, he has been recovering well.  He continues to note some fatigue with shortness of breath on exertion although this has been improving as time goes on.  He specifically denies chest pain, palpitations, leg swelling.  COPD Mr. Franne Grip is a smoker who has a significant history of COPD.  He has previously been on multiple inhaled medications but only currently takes albuterol.  He feels that he has been using a lot of albuterol during his recent illness and would like albuterol refills.  PERTINENT  PMH / PSH: COPD, recent viral illness (likely Covid)  OBJECTIVE:   BP 120/70    Pulse 86    Ht 5\' 10"  (1.778 m)    Wt 171 lb 2 oz (77.6 kg)    SpO2 99%    BMI 24.55 kg/m    General: Alert and cooperative and appears to be in no acute distress.  Resting comfortably in his chair in the exam room.  Able to stand comfortably and walk around the exam room without issue. HEENT: Neck non-tender without lymphadenopathy, masses or thyromegaly Cardio: Normal S1 and S2, no S3 or S4. Rhythm is regular. No murmurs or rubs.   Pulm: Wheezing noted in all lung fields.  Mildly prolonged expiratory phase.  Breathing comfortably on room air without any evidence of respiratory distress. Extremities: No peripheral edema. Warm/ well perfused.  Strong radial pulse. Neuro: Cranial nerves grossly intact  ASSESSMENT/PLAN:   Lung mass -Follow-up CT chest with contrast  COPD exacerbation (HCC) His COPD appears poorly controlled at the moment which is in part due to his recent viral illness.   He is not demonstrating any significant work of breathing on exam in clinic today.  He was encouraged to restart one of his previous COPD medications Ruthe Mannan). -Albuterol refilled -Dulera refilled -Follow-up in 1 month for further discussion about COPD appropriate medications     Matilde Haymaker, MD [place]

## 2020-05-19 NOTE — Assessment & Plan Note (Signed)
His COPD appears poorly controlled at the moment which is in part due to his recent viral illness.  He is not demonstrating any significant work of breathing on exam in clinic today.  He was encouraged to restart one of his previous COPD medications Ruthe Mannan). -Albuterol refilled -Dulera refilled -Follow-up in 1 month for further discussion about COPD appropriate medications

## 2020-06-01 ENCOUNTER — Ambulatory Visit
Admission: RE | Admit: 2020-06-01 | Discharge: 2020-06-01 | Disposition: A | Discharge status: Home / Self Care | Payer: Medicare Other | Source: Ambulatory Visit | Attending: Family Medicine | Admitting: Family Medicine

## 2020-06-01 DIAGNOSIS — R918 Other nonspecific abnormal finding of lung field: Secondary | ICD-10-CM

## 2020-06-01 MED ORDER — IOPAMIDOL (ISOVUE-300) INJECTION 61%
75.0000 mL | Freq: Once | INTRAVENOUS | Status: AC | PRN
  Administered 2020-06-01: 75 mL via INTRAVENOUS

## 2020-06-09 ENCOUNTER — Other Ambulatory Visit: Payer: Self-pay

## 2020-06-09 ENCOUNTER — Ambulatory Visit (INDEPENDENT_AMBULATORY_CARE_PROVIDER_SITE_OTHER): Payer: Medicare Other | Admitting: Family Medicine

## 2020-06-09 ENCOUNTER — Encounter: Payer: Self-pay | Admitting: Family Medicine

## 2020-06-09 DIAGNOSIS — Z720 Tobacco use: Secondary | ICD-10-CM

## 2020-06-09 DIAGNOSIS — F39 Unspecified mood [affective] disorder: Secondary | ICD-10-CM | POA: Diagnosis not present

## 2020-06-09 DIAGNOSIS — J449 Chronic obstructive pulmonary disease, unspecified: Secondary | ICD-10-CM | POA: Diagnosis not present

## 2020-06-09 DIAGNOSIS — R112 Nausea with vomiting, unspecified: Secondary | ICD-10-CM | POA: Diagnosis not present

## 2020-06-09 DIAGNOSIS — R1084 Generalized abdominal pain: Secondary | ICD-10-CM | POA: Diagnosis not present

## 2020-06-09 DIAGNOSIS — K219 Gastro-esophageal reflux disease without esophagitis: Secondary | ICD-10-CM | POA: Diagnosis not present

## 2020-06-09 DIAGNOSIS — I10 Essential (primary) hypertension: Secondary | ICD-10-CM | POA: Diagnosis not present

## 2020-06-09 MED ORDER — PANTOPRAZOLE SODIUM 40 MG PO TBEC: 40.0000 mg | DELAYED_RELEASE_TABLET | Freq: Every day | ORAL | 3 refills | Status: DC

## 2020-06-09 MED ORDER — SPIRIVA HANDIHALER 18 MCG IN CAPS
18.0000 ug | ORAL_CAPSULE | Freq: Two times a day (BID) | RESPIRATORY_TRACT | 12 refills | Status: DC
Start: 2020-06-09 — End: 2020-06-12

## 2020-06-09 MED ORDER — LOSARTAN POTASSIUM 100 MG PO TABS
ORAL_TABLET | ORAL | 3 refills | Status: DC
Start: 2020-06-09 — End: 2021-07-10

## 2020-06-09 MED ORDER — GABAPENTIN 100 MG PO CAPS
100.0000 mg | ORAL_CAPSULE | Freq: Two times a day (BID) | ORAL | 3 refills | Status: DC
Start: 2020-06-09 — End: 2020-10-10

## 2020-06-09 NOTE — Patient Instructions (Signed)
Smoking cessation: I think that cutting back or quitting smoking is the most significant change he can make for your health.  If you are interested in cutting back or quitting, left to help you.  COPD: I would like you to start taking your Spiriva twice daily.  Come back to clinic in 1 month let see if you have reduced your albuterol use.  Hypertension: I have refilled your losartan.

## 2020-06-09 NOTE — Progress Notes (Signed)
    SUBJECTIVE:   CHIEF COMPLAINT / HPI:   COPD Uses his albuterol inhaler 1-2 times daily.  He reports that he has tried Spiriva and [employer] in the past without significant success.  He currently only uses albuterol.  He continues to smoke about 2 packs/day.  He reports that his shortness of breath does sometimes impede his daily activity.  He has not had any hospitalizations in the past 2 years due to COPD.  Current smoker Currently smokes about 2 packs/day.  Not interested in smoking cessation.  Hypertension Has not taken his antihypertensives in the past 2 days.  Mood disorder Currently takes BuSpar 15 mg daily.  He likes this medicine and has been stable on it for at least the past year.  PHQ-9 today demonstrated a score of 8 with a score of 0 for question 9.  PERTINENT  PMH / PSH: Current smoker, COPD, mood disorder  OBJECTIVE:   BP 134/80   Pulse 100   Ht 5\' 10"  (1.778 m)   Wt 173 lb 6.4 oz (78.7 kg)   SpO2 97%   BMI 24.88 kg/m    General: Alert and cooperative and appears to be in no acute distress HEENT: Neck non-tender without lymphadenopathy, masses or thyromegaly Cardio: Normal S1 and S2, no S3 or S4. Rhythm is regular. No murmurs or rubs.   Pulm: Normal respiratory effort on room air with occasional deep breaths.  wheezing noted in middle fields bilaterally. Extremities: No peripheral edema. Warm/ well perfused.   ASSESSMENT/PLAN:   Chronic obstructive pulmonary disease ([place]) We discussed how this is likely related to his smoking and is African-American.  Cut back on his smoking.  We discussed that her goal should be to reduce his use of albuterol. -Start Spiriva -Monitor albuterol use, return in 1 month -Encouraged to [place] use at return visit -Consider alpha-1 antitrypsin testing at next visit  Essential hypertension, benign Today's blood pressure is a little bit high although not an accurate reading on his medication because he been off medication  for 2 days. -Losartan refilled -Continue to monitor pressure at subsequent visits  Mood disorder ([place]) Stable at this time. -BuSpar refilled.  Tobacco abuse Not interested in smoking cessation at this time.  He is encouraged to consider cessation return to the office for further conversations at any time.     Matilde Haymaker, MD [place]

## 2020-06-10 NOTE — Assessment & Plan Note (Signed)
Today's blood pressure is a little bit high although not an accurate reading on his medication because he been off medication for 2 days. -Losartan refilled -Continue to monitor pressure at subsequent visits

## 2020-06-10 NOTE — Assessment & Plan Note (Signed)
Stable at this time. -BuSpar refilled.

## 2020-06-10 NOTE — Assessment & Plan Note (Signed)
We discussed how this is likely related to his smoking and is African-American.  Cut back on his smoking.  We discussed that her goal should be to reduce his use of albuterol. -Start Spiriva -Monitor albuterol use, return in 1 month -Encouraged to [place] use at return visit -Consider alpha-1 antitrypsin testing at next visit

## 2020-06-10 NOTE — Assessment & Plan Note (Signed)
Not interested in smoking cessation at this time.  He is encouraged to consider cessation return to the office for further conversations at any time.

## 2020-06-11 ENCOUNTER — Other Ambulatory Visit: Payer: Self-pay | Admitting: Family Medicine

## 2020-06-11 DIAGNOSIS — R1084 Generalized abdominal pain: Secondary | ICD-10-CM

## 2020-06-11 DIAGNOSIS — K219 Gastro-esophageal reflux disease without esophagitis: Secondary | ICD-10-CM

## 2020-06-11 DIAGNOSIS — R112 Nausea with vomiting, unspecified: Secondary | ICD-10-CM

## 2020-06-12 ENCOUNTER — Other Ambulatory Visit: Payer: Self-pay

## 2020-06-12 NOTE — Progress Notes (Signed)
SUBJECTIVE:   CHIEF COMPLAINT / HPI:   "Knot" in pubic area: Patient is a pleasant 54 year old male that presents today to discuss a "knot" in his pubic area.  Patient states he has had multiple abdominal surgeries in the past including 2 to work on his mesh. He was previously evaluated here and had a CT scan which showed "Inflammatory mass in the fat anterior and superior to the symphysis pubis region measuring 2.8 cm in diameter and extending over a length of 5 cm. Some of this material tracks in the midline through the anterior abdominal wall, extending upward in the midline beneath the anterior abdominal wall and hernia repair. Findings are worrisome for infectious inflammation". He states he got doxycycline at that time which helped his symptoms.  He states his swelling started Saturday and before that the swollen area was flat and appeared normal. He denies fevers, vomiting, or difficulty with urination but states the swollen area is very uncomfortable. He states his previous surgeon requested that he see the patient the next time it flared up.  He states that typically a few days after taking doxycycline his lesion completely resolves and is hoping he can get in with his surgeon prior to the Thanksgiving holiday so he can be evaluated before it resolves from antibiotics.  PERTINENT  PMH / PSH: Multiple previous GI surgeries.  OBJECTIVE:   BP 110/70    Pulse (!) 121    Ht 5\' 10"  (1.778 m)    Wt 176 lb 2 oz (79.9 kg)    SpO2 94%    BMI 25.27 kg/m    Pulse recheck 96  General: NAD, pleasant, able to participate in exam Cardiac: RRR, no murmurs. Respiratory: No respiratory distress  Abdomen: Bowel sounds present patient has some discomfort and swelling to the lower region of his abdomen at the base of the surgical scar as seen in the image below.  He does have some erythema but no fluctuance palpated. Psych: Normal affect and mood        ASSESSMENT/PLAN:   Abdominal  cyst Assessment: Patient with swelling and discomfort to his lower abdomen, consistent with his previous presentations.  Patient states that in the past his surgeon did request that he make a follow-up appointment when 1 of these swollen episodes occur so he could be evaluated.  Per the patient request I did reach out to his surgeon's team but they are unavailable until November 30 and that would be at the [place] office which the patient stated would not work for him.  Patient had been evaluated for this issue multiple times in the past and states that he has previously used doxycycline with good results.  He states that his previous discussions had mentioned that this is likely occurring right at the edge of his previous mesh which was implanted once during one of his previous GI surgeries.  Patient denies any fevers or other symptoms other than some swelling and discomfort in the area. Plan: -We will prescribe doxycycline 100 mg twice daily for 7 days as this is worked well for the patient in the past -Recommend patient make a follow-up appointment with his surgeon for continued evaluation as needed -Gave patient strict return precautions as we are coming up on the Thanksgiving holiday and our office will be closed.  Patient plans to go to the emergency department if he develops any fevers, worsening pain, worsening swelling, or systemic symptoms.     Lurline Del, [facility]    This note was prepared using Dragon voice recognition software and may include unintentional dictation errors due to the inherent limitations of voice recognition software.

## 2020-06-12 NOTE — Patient Instructions (Signed)
It was great to see you!  Our plans for today:  - I attempted to reach out to your surgeon's office but unfortunately they have nothing available for the near future - I am sending in 7 days of an antibiotic called doxycycline - If you develop worsening pain, increase in swelling, fevers, or other concerning symptoms I would like for you to go to the emergency department.  -If the antibiotic does not fully reduce the swelling I would like for you to follow up with [place] or schedule an appointment with your previous surgeon  Take care and seek immediate care sooner if you develop any concerns.   Dr. [facility]

## 2020-06-13 ENCOUNTER — Other Ambulatory Visit: Payer: Self-pay

## 2020-06-13 ENCOUNTER — Ambulatory Visit (INDEPENDENT_AMBULATORY_CARE_PROVIDER_SITE_OTHER): Payer: Medicare Other | Admitting: Family Medicine

## 2020-06-13 VITALS — BP 110/70 | HR 121 | Ht 70.0 in | Wt 176.1 lb

## 2020-06-13 DIAGNOSIS — L720 Epidermal cyst: Secondary | ICD-10-CM

## 2020-06-13 MED ORDER — DOXYCYCLINE HYCLATE 100 MG PO TABS: 100.0000 mg | ORAL_TABLET | Freq: Two times a day (BID) | ORAL | 0 refills | Status: DC

## 2020-06-13 NOTE — Assessment & Plan Note (Signed)
Assessment: Patient with swelling and discomfort to his lower abdomen, consistent with his previous presentations.  Patient states that in the past his surgeon did request that he make a follow-up appointment when 1 of these swollen episodes occur so he could be evaluated.  Per the patient request I did reach out to his surgeon's team but they are unavailable until November 30 and that would be at the [place] office which the patient stated would not work for him.  Patient had been evaluated for this issue multiple times in the past and states that he has previously used doxycycline with good results.  He states that his previous discussions had mentioned that this is likely occurring right at the edge of his previous mesh which was implanted once during one of his previous GI surgeries.  Patient denies any fevers or other symptoms other than some swelling and discomfort in the area. Plan: -We will prescribe doxycycline 100 mg twice daily for 7 days as this is worked well for the patient in the past -Recommend patient make a follow-up appointment with his surgeon for continued evaluation as needed -Gave patient strict return precautions as we are coming up on the Thanksgiving holiday and our office will be closed.  Patient plans to go to the emergency department if he develops any fevers, worsening pain, worsening swelling, or systemic symptoms.

## 2020-06-19 MED ORDER — SPIRIVA HANDIHALER 18 MCG IN CAPS: 18.0000 ug | ORAL_CAPSULE | Freq: Two times a day (BID) | RESPIRATORY_TRACT | 12 refills | Status: DC

## 2020-06-24 ENCOUNTER — Other Ambulatory Visit: Payer: Self-pay | Admitting: Family Medicine

## 2020-07-03 ENCOUNTER — Telehealth: Payer: Self-pay

## 2020-07-03 NOTE — Telephone Encounter (Signed)
Received phone call from pharmacist regarding clarification of Spiriva instructions. Pharmacist states that medication is typically written to place one capsule into inhaler and inhale once daily. However, current rx is written to inhale twice daily.   Please advise which instructions are to be used.   To PCP  Talbot Grumbling, RN

## 2020-07-03 NOTE — Telephone Encounter (Signed)
Once daily is okay with me. Matilde Haymaker, MD

## 2020-07-07 NOTE — Telephone Encounter (Signed)
Called and provided clarification to pharmacist.   Talbot Grumbling, RN

## 2020-07-25 ENCOUNTER — Ambulatory Visit: Payer: Medicare Other | Admitting: Family Medicine

## 2020-08-08 ENCOUNTER — Ambulatory Visit: Payer: Medicare Other | Admitting: Family Medicine

## 2020-08-23 NOTE — Progress Notes (Signed)
Patient was scheduled with access to care clinic in 08/24/2020.  Patient did not show up to this appointment.  Upon chart review patient has also missed appointments 08/08/2020, 07/25/2020, 04/05/2020, and 02/29/2020.  Called patient at number [phone], there was no answer, left HIPAA compliant voicemail.  Also sent letter to patient's home at [address] [place], [place] [address].  Timothy [name], [place], PGY-3 08/24/2020 5:42 PM

## 2020-08-24 ENCOUNTER — Ambulatory Visit (INDEPENDENT_AMBULATORY_CARE_PROVIDER_SITE_OTHER): Payer: Medicare Other | Admitting: Family Medicine

## 2020-08-24 DIAGNOSIS — Z5329 Procedure and treatment not carried out because of patient's decision for other reasons: Secondary | ICD-10-CM

## 2020-08-24 DIAGNOSIS — Z91199 Patient's noncompliance with other medical treatment and regimen due to unspecified reason: Secondary | ICD-10-CM | POA: Insufficient documentation

## 2020-10-07 ENCOUNTER — Other Ambulatory Visit: Payer: Self-pay | Admitting: Family Medicine

## 2020-11-24 ENCOUNTER — Other Ambulatory Visit: Payer: Self-pay | Admitting: Family Medicine

## 2020-12-13 ENCOUNTER — Telehealth: Payer: Self-pay

## 2020-12-13 NOTE — Telephone Encounter (Signed)
Patient calls nurse line regarding LLQ abdominal cyst. Patient reports that he has had history of similar cysts. Reports pain shooting in stomach and into left testicle. Symptoms are consistent with previous episodes.   Offered to schedule patient appointment for today. However, patient is unable to come in today and is requesting appointment for tomorrow. Patient is scheduled for tomorrow afternoon at 1430.   Precepted with Dr. Gwendlyn Deutscher, who recommended the use of OTC ibuprofen PRN until visit tomorrow.   Provided with strict ED precautions.    Patient verbalizes understanding.   Talbot Grumbling, RN

## 2020-12-14 ENCOUNTER — Other Ambulatory Visit: Payer: Self-pay

## 2020-12-14 ENCOUNTER — Ambulatory Visit (INDEPENDENT_AMBULATORY_CARE_PROVIDER_SITE_OTHER): Payer: Medicare Other | Admitting: Family Medicine

## 2020-12-14 DIAGNOSIS — N5082 Scrotal pain: Secondary | ICD-10-CM

## 2020-12-14 DIAGNOSIS — N50812 Left testicular pain: Secondary | ICD-10-CM | POA: Diagnosis not present

## 2020-12-14 DIAGNOSIS — R222 Localized swelling, mass and lump, trunk: Secondary | ICD-10-CM

## 2020-12-14 MED ORDER — DOXYCYCLINE HYCLATE 100 MG PO TABS
100.0000 mg | ORAL_TABLET | Freq: Two times a day (BID) | ORAL | 0 refills | Status: DC
Start: 2020-12-14 — End: 2021-04-05

## 2020-12-14 NOTE — Assessment & Plan Note (Signed)
Previously relieved with doxycycline suggesting infectious etiology.  Physical exam is consistent with previous physical exams and photos, though he does not have any surrounding erythema as in previous pictures.  Patient has tried to reach his surgeon given previous CT findings that suggest this could be associated with history of multiple abdominal surgeries, though he reports by the time he gets to his surgeon, he has resolution of symptoms.  He reports that Dr. Marlou Starks is aware of patient's symptoms.  I have recommended that he make an appointment with Dr. Marlou Starks.  I will call over and ask for further recommendations on behalf of the patient given multiple and more frequent recurrence of fluctuant mass.

## 2020-12-14 NOTE — Assessment & Plan Note (Signed)
Acute. My biggest concern is that patient's infection is spreading into testicular area through fascial layers.  We will schedule patient for scrotal ultrasound to rule out torsion, though if he has improvement in testicular pain, would suggest infectious etiology. Will not test for GC/C as he is being treated with doxycycline anyway.

## 2020-12-14 NOTE — Patient Instructions (Signed)
I have sent the antibiotic to the pharmacy. We are also going to set up an ultrasound.  If your pain is improved when you start the antibiotics, it likely means that you had an infection called epididymitis that improved with the antibiotics.  If that is the case, you do not need to go to your ultrasound. I would like you to make an appointment with the surgeon as you do have CT findings that show this could be coming from previous surgery.  I will call over to Dr. Ethlyn Gallery office as well.

## 2020-12-14 NOTE — Progress Notes (Signed)
    SUBJECTIVE:   CHIEF COMPLAINT / HPI:   Knot in pubic area X 2 days.  Patient has had multiple presentations of this since 01/2018.  All of these episodes have responded well to doxycycline.  He does report that this is occurring more often which is worrisome to him. No fevers, chills, draining from the area.  Testicular pain Patient also reports that he has had right testicular pain starting since yesterday.  It is located on the left side.  He is sexually active in a monogamous relationship.  No trauma.  PERTINENT  PMH / PSH: Bipolar disorder, polysubstance abuse, multiple abdominal surgeries in 2013 in 2014 with wound debridement surgery in 2017  OBJECTIVE:   BP 104/76   Pulse (!) 104   Ht 5\' 10"  (1.778 m)   Wt 162 lb 6.4 oz (73.7 kg)   SpO2 98%   BMI 23.30 kg/m   General: Well-appearing male, no acute distress GU: Patient has large fluctuant mass in pubic area measuring about 10 cm in diameter.  No erythema and no obvious draining.  He does not have any palpable inguinal hernia, though does have tenderness to palpation throughout this area.  Patient has tenderness to palpation of his left testis.  He does not have any scrotal swelling or edema.  Right testis is nontender to palpation.  No Bell Clapper deformity.  Tenderness to palpation of spermatic cord.  ASSESSMENT/PLAN:   Subcutaneous mass of abdominal wall Previously relieved with doxycycline suggesting infectious etiology.  Physical exam is consistent with previous physical exams and photos, though he does not have any surrounding erythema as in previous pictures.  Patient has tried to reach his surgeon given previous CT findings that suggest this could be associated with history of multiple abdominal surgeries, though he reports by the time he gets to his surgeon, he has resolution of symptoms.  He reports that Dr. Marlou Starks is aware of patient's symptoms.  I have recommended that he make an appointment with Dr. Marlou Starks.  I will call  over and ask for further recommendations on behalf of the patient given multiple and more frequent recurrence of fluctuant mass.  Testicular pain, left Acute. My biggest concern is that patient's infection is spreading into testicular area through fascial layers.  We will schedule patient for scrotal ultrasound to rule out torsion, though if he has improvement in testicular pain, would suggest infectious etiology. Will not test for GC/C as he is being treated with doxycycline anyway.      Timothy Oliphant, MD [place]

## 2020-12-15 NOTE — Progress Notes (Addendum)
After further chart review, patient has had recurrence of suprapubic mass in 02/02/2018, 05/18/2019, 06/13/20, and 12/14/20. All have improved with doxycycline course. Spoke to Triage nurse at Dr. Ethlyn Gallery office this morning.  She reports that patient was seen with Dr. Marlou Starks in November 2020.  At that time, recommended that patient complete doxycycline course and monitor, with follow-up if recurrence. Triage nurse will attempt to talk to CMA to see if he can be squeezed into Dr. Marlou Starks schedule later today.  They will contact the patient directly.

## 2020-12-22 ENCOUNTER — Other Ambulatory Visit: Payer: Self-pay | Admitting: General Surgery

## 2020-12-22 DIAGNOSIS — R1909 Other intra-abdominal and pelvic swelling, mass and lump: Secondary | ICD-10-CM

## 2020-12-26 ENCOUNTER — Other Ambulatory Visit: Payer: Medicare Other

## 2021-01-04 ENCOUNTER — Ambulatory Visit
Admission: RE | Admit: 2021-01-04 | Discharge: 2021-01-04 | Disposition: A | Discharge status: Home / Self Care | Payer: Medicare Other | Source: Ambulatory Visit | Attending: Family Medicine | Admitting: Family Medicine

## 2021-01-04 DIAGNOSIS — N5082 Scrotal pain: Secondary | ICD-10-CM

## 2021-01-09 ENCOUNTER — Ambulatory Visit
Admission: RE | Admit: 2021-01-09 | Discharge: 2021-01-09 | Disposition: A | Discharge status: Home / Self Care | Payer: Medicare Other | Source: Ambulatory Visit | Attending: General Surgery | Admitting: General Surgery

## 2021-01-09 DIAGNOSIS — R1909 Other intra-abdominal and pelvic swelling, mass and lump: Secondary | ICD-10-CM

## 2021-02-13 ENCOUNTER — Other Ambulatory Visit: Payer: Self-pay | Admitting: General Surgery

## 2021-02-13 DIAGNOSIS — R222 Localized swelling, mass and lump, trunk: Secondary | ICD-10-CM

## 2021-02-13 DIAGNOSIS — L02211 Cutaneous abscess of abdominal wall: Secondary | ICD-10-CM

## 2021-02-19 ENCOUNTER — Other Ambulatory Visit: Payer: Self-pay

## 2021-02-19 DIAGNOSIS — K219 Gastro-esophageal reflux disease without esophagitis: Secondary | ICD-10-CM

## 2021-02-19 DIAGNOSIS — R1084 Generalized abdominal pain: Secondary | ICD-10-CM

## 2021-02-19 DIAGNOSIS — R112 Nausea with vomiting, unspecified: Secondary | ICD-10-CM

## 2021-02-20 ENCOUNTER — Other Ambulatory Visit: Payer: Self-pay

## 2021-02-20 ENCOUNTER — Ambulatory Visit (HOSPITAL_COMMUNITY)
Admission: RE | Admit: 2021-02-20 | Discharge: 2021-02-20 | Disposition: A | Discharge status: Home / Self Care | Payer: Medicare Other | Source: Ambulatory Visit | Attending: General Surgery | Admitting: General Surgery

## 2021-02-20 ENCOUNTER — Telehealth: Payer: Self-pay | Admitting: Student

## 2021-02-20 DIAGNOSIS — L02211 Cutaneous abscess of abdominal wall: Secondary | ICD-10-CM | POA: Insufficient documentation

## 2021-02-20 LAB — POCT I-STAT CREATININE: Creatinine, Ser: 0.7 mg/dL (ref 0.61–1.24)

## 2021-02-20 MED ORDER — IOHEXOL 350 MG/ML SOLN
100.0000 mL | Freq: Once | INTRAVENOUS | Status: AC | PRN
  Administered 2021-02-20: 80 mL via INTRAVENOUS

## 2021-02-20 MED ORDER — PANTOPRAZOLE SODIUM 40 MG PO TBEC: 40.0000 mg | DELAYED_RELEASE_TABLET | Freq: Every day | ORAL | 2 refills | Status: DC

## 2021-02-20 NOTE — Telephone Encounter (Signed)
Patient has been on same high dose 40 mg bid since 2017. Unless patient is actively symptomatic, can reduce to 40 mg daily to decrease risk of renal impairment. Please have patient come in to discuss further if symptomatic

## 2021-02-20 NOTE — Telephone Encounter (Signed)
Patient has been on same high dose 40 mg bid since 2017. H Pylori negative in 2017. No evidence of esophagitis. Unless patient is actively symptomatic, can reduce to 40 mg daily to decrease risk of renal impairment. Please have patient come in to discuss further if symptomatic.

## 2021-02-21 MED ORDER — BUSPIRONE HCL 15 MG PO TABS
15.0000 mg | ORAL_TABLET | Freq: Every day | ORAL | 2 refills | Status: DC
Start: 2021-02-21 — End: 2021-05-08

## 2021-03-09 ENCOUNTER — Other Ambulatory Visit: Payer: Self-pay

## 2021-03-09 NOTE — Telephone Encounter (Signed)
Phone Note in Chart. Salvatore Marvel, CMA

## 2021-03-14 NOTE — Telephone Encounter (Signed)
Will be in [facility] clinic soon, can discuss at this appointment

## 2021-03-19 ENCOUNTER — Ambulatory Visit: Payer: Medicare Other

## 2021-04-03 ENCOUNTER — Encounter (HOSPITAL_COMMUNITY): Payer: Self-pay | Admitting: General Surgery

## 2021-04-03 ENCOUNTER — Other Ambulatory Visit: Payer: Self-pay

## 2021-04-03 NOTE — Progress Notes (Signed)
Spoke with pt for pre-op call. Pt denies cardiac history. Pt is treated for HTN. He states he is not diabetic.  Pt's surgery is scheduled as ambulatory so no Covid test is required prior to surgery.

## 2021-04-04 NOTE — Anesthesia Preprocedure Evaluation (Addendum)
Anesthesia Evaluation  Patient identified by MRN, date of birth, ID band Patient awake    Reviewed: Allergy & Precautions, NPO status , Patient's Chart, lab work & pertinent test results  Airway Mallampati: II  TM Distance: >3 FB     Dental no notable dental hx.    Pulmonary COPD (bronchitis; emphysema), Current SmokerPatient did not abstain from smoking.,    breath sounds clear to auscultation       Cardiovascular Exercise Tolerance: Good hypertension,  Rhythm:Regular Rate:Tachycardia     Neuro/Psych  Headaches, PSYCHIATRIC DISORDERS Anxiety Depression Bipolar Disorder  Neuromuscular disease (low back pain and sciatica)    GI/Hepatic PUD, GERD  ,(+)     substance abuse  alcohol use and marijuana use, diverticulosis   Endo/Other  negative endocrine ROS  Renal/GU negative Renal ROS     Musculoskeletal  (+) Arthritis , Chronic pain   Abdominal   Peds negative pediatric ROS (+)  Hematology   Anesthesia Other Findings   Reproductive/Obstetrics                           Anesthesia Physical  Anesthesia Plan  ASA: 3  Anesthesia Plan: General   Post-op Pain Management:    Induction: Intravenous  PONV Risk Score and Plan: 0 and Treatment may vary due to age or medical condition, Midazolam, Scopolamine patch - Pre-op and Ondansetron  Airway Management Planned: Oral ETT  Additional Equipment: None  Intra-op Plan:   Post-operative Plan: Extubation in OR  Informed Consent: I have reviewed the patients History and Physical, chart, labs and discussed the procedure including the risks, benefits and alternatives for the proposed anesthesia with the patient or authorized representative who has indicated his/her understanding and acceptance.     Dental advisory given  Plan Discussed with: CRNA and Anesthesiologist  Anesthesia Plan Comments: (GETA. )       Anesthesia Quick  Evaluation

## 2021-04-05 ENCOUNTER — Ambulatory Visit (HOSPITAL_COMMUNITY): Payer: Medicare Other | Admitting: Anesthesiology

## 2021-04-05 ENCOUNTER — Encounter (HOSPITAL_COMMUNITY)
Admission: RE | Disposition: A | Discharge status: Home / Self Care | Payer: Self-pay | Source: Home / Self Care | Attending: General Surgery

## 2021-04-05 ENCOUNTER — Ambulatory Visit (HOSPITAL_COMMUNITY)
Admission: RE | Admit: 2021-04-05 | Discharge: 2021-04-05 | Disposition: A | Discharge status: Home / Self Care | Payer: Medicare Other | Attending: General Surgery | Admitting: General Surgery

## 2021-04-05 ENCOUNTER — Encounter (HOSPITAL_COMMUNITY): Payer: Self-pay | Admitting: General Surgery

## 2021-04-05 DIAGNOSIS — Z888 Allergy status to other drugs, medicaments and biological substances status: Secondary | ICD-10-CM | POA: Diagnosis not present

## 2021-04-05 DIAGNOSIS — F17210 Nicotine dependence, cigarettes, uncomplicated: Secondary | ICD-10-CM | POA: Insufficient documentation

## 2021-04-05 DIAGNOSIS — Z79899 Other long term (current) drug therapy: Secondary | ICD-10-CM | POA: Insufficient documentation

## 2021-04-05 DIAGNOSIS — L02211 Cutaneous abscess of abdominal wall: Secondary | ICD-10-CM | POA: Insufficient documentation

## 2021-04-05 HISTORY — PX: INCISION AND DRAINAGE ABSCESS: SHX5864

## 2021-04-05 LAB — BASIC METABOLIC PANEL
Anion gap: 17 — ABNORMAL HIGH (ref 5–15)
BUN: 15 mg/dL (ref 6–20)
CO2: 21 mmol/L — ABNORMAL LOW (ref 22–32)
Calcium: 9.4 mg/dL (ref 8.9–10.3)
Chloride: 103 mmol/L (ref 98–111)
Creatinine, Ser: 0.84 mg/dL (ref 0.61–1.24)
GFR, Estimated: >60 mL/min (ref >60)
Glucose, Bld: 113 mg/dL — ABNORMAL HIGH (ref 70–99)
Potassium: 4 mmol/L (ref 3.5–5.1)
Sodium: 141 mmol/L (ref 135–145)

## 2021-04-05 LAB — CBC
HCT: 31.7 % — ABNORMAL LOW (ref 39.0–52.0)
Hemoglobin: 10.3 g/dL — ABNORMAL LOW (ref 13.0–17.0)
MCH: 33.1 pg (ref 26.0–34.0)
MCHC: 32.5 g/dL (ref 30.0–36.0)
MCV: 101.9 fL — ABNORMAL HIGH (ref 80.0–100.0)
Platelets: 231 10*3/uL (ref 150–400)
RBC: 3.11 MIL/uL — ABNORMAL LOW (ref 4.22–5.81)
RDW: 13.9 % (ref 11.5–15.5)
WBC: 3.8 10*3/uL — ABNORMAL LOW (ref 4.0–10.5)
nRBC: 0 % (ref 0.0–0.2)

## 2021-04-05 LAB — GLUCOSE, CAPILLARY: Glucose-Capillary: 113 mg/dL — ABNORMAL HIGH (ref 70–99)

## 2021-04-05 SURGERY — INCISION AND DRAINAGE, ABSCESS
Anesthesia: General | Site: Abdomen

## 2021-04-05 MED ORDER — ROCURONIUM BROMIDE 10 MG/ML (PF) SYRINGE
PREFILLED_SYRINGE | INTRAVENOUS | Status: DC | PRN
  Administered 2021-04-05: 80 mg via INTRAVENOUS

## 2021-04-05 MED ORDER — PROMETHAZINE HCL 25 MG/ML IJ SOLN: 6.2500 mg | INTRAMUSCULAR | Status: DC | PRN

## 2021-04-05 MED ORDER — DOXYCYCLINE HYCLATE 100 MG PO TABS
100.0000 mg | ORAL_TABLET | Freq: Two times a day (BID) | ORAL | 1 refills | Status: DC
Start: 2021-04-05 — End: 2021-06-13

## 2021-04-05 MED ORDER — VANCOMYCIN HCL 1000 MG IV SOLR
INTRAVENOUS | Status: DC | PRN
  Administered 2021-04-05: 1000 mg via INTRAVENOUS

## 2021-04-05 MED ORDER — LACTATED RINGERS IV SOLN: INTRAVENOUS | Status: DC

## 2021-04-05 MED ORDER — LIDOCAINE 2% (20 MG/ML) 5 ML SYRINGE
INTRAMUSCULAR | Status: DC | PRN
Start: 2021-04-05 — End: 2021-04-05
  Administered 2021-04-05: 80 mg via INTRAVENOUS

## 2021-04-05 MED ORDER — 0.9 % SODIUM CHLORIDE (POUR BTL) OPTIME
TOPICAL | Status: DC | PRN
  Administered 2021-04-05: 1000 mL

## 2021-04-05 MED ORDER — ONDANSETRON HCL 4 MG/2ML IJ SOLN
INTRAMUSCULAR | Status: DC | PRN
  Administered 2021-04-05: 4 mg via INTRAVENOUS

## 2021-04-05 MED ORDER — CHLORHEXIDINE GLUCONATE 0.12 % MT SOLN
15.0000 mL | OROMUCOSAL | Status: AC
  Administered 2021-04-05: 15 mL via OROMUCOSAL
  Filled 2021-04-05: qty 15

## 2021-04-05 MED ORDER — MIDAZOLAM HCL 2 MG/2ML IJ SOLN
INTRAMUSCULAR | Status: AC
  Filled 2021-04-05: qty 2

## 2021-04-05 MED ORDER — FENTANYL CITRATE (PF) 100 MCG/2ML IJ SOLN
INTRAMUSCULAR | Status: AC
  Filled 2021-04-05: qty 2

## 2021-04-05 MED ORDER — DEXAMETHASONE SODIUM PHOSPHATE 10 MG/ML IJ SOLN
INTRAMUSCULAR | Status: AC
  Filled 2021-04-05: qty 1

## 2021-04-05 MED ORDER — DROPERIDOL 2.5 MG/ML IJ SOLN: 0.6250 mg | Freq: Once | INTRAMUSCULAR | Status: DC | PRN

## 2021-04-05 MED ORDER — DEXAMETHASONE SODIUM PHOSPHATE 10 MG/ML IJ SOLN
INTRAMUSCULAR | Status: DC | PRN
  Administered 2021-04-05: 10 mg via INTRAVENOUS

## 2021-04-05 MED ORDER — BUPIVACAINE-EPINEPHRINE (PF) 0.25% -1:200000 IJ SOLN
INTRAMUSCULAR | Status: AC
  Filled 2021-04-05: qty 30

## 2021-04-05 MED ORDER — LIDOCAINE 2% (20 MG/ML) 5 ML SYRINGE
INTRAMUSCULAR | Status: AC
  Filled 2021-04-05: qty 5

## 2021-04-05 MED ORDER — ROCURONIUM BROMIDE 10 MG/ML (PF) SYRINGE
PREFILLED_SYRINGE | INTRAVENOUS | Status: AC
  Filled 2021-04-05: qty 10

## 2021-04-05 MED ORDER — SUGAMMADEX SODIUM 200 MG/2ML IV SOLN
INTRAVENOUS | Status: DC | PRN
  Administered 2021-04-05: 400 mg via INTRAVENOUS

## 2021-04-05 MED ORDER — HYDROCODONE-ACETAMINOPHEN 5-325 MG PO TABS: 1.0000 | ORAL_TABLET | Freq: Four times a day (QID) | ORAL | 0 refills | Status: DC | PRN

## 2021-04-05 MED ORDER — BUPIVACAINE-EPINEPHRINE 0.25% -1:200000 IJ SOLN
INTRAMUSCULAR | Status: DC | PRN
  Administered 2021-04-05: 6 mL

## 2021-04-05 MED ORDER — FENTANYL CITRATE (PF) 250 MCG/5ML IJ SOLN
INTRAMUSCULAR | Status: DC | PRN
  Administered 2021-04-05: 50 ug via INTRAVENOUS

## 2021-04-05 MED ORDER — ONDANSETRON HCL 4 MG/2ML IJ SOLN
INTRAMUSCULAR | Status: AC
  Filled 2021-04-05: qty 2

## 2021-04-05 MED ORDER — PHENYLEPHRINE 40 MCG/ML (10ML) SYRINGE FOR IV PUSH (FOR BLOOD PRESSURE SUPPORT)
PREFILLED_SYRINGE | INTRAVENOUS | Status: DC | PRN
  Administered 2021-04-05 (×5): 80 ug via INTRAVENOUS

## 2021-04-05 MED ORDER — VANCOMYCIN HCL IN DEXTROSE 1-5 GM/200ML-% IV SOLN
INTRAVENOUS | Status: AC
  Filled 2021-04-05: qty 200

## 2021-04-05 MED ORDER — PROPOFOL 10 MG/ML IV BOLUS
INTRAVENOUS | Status: DC | PRN
  Administered 2021-04-05: 200 mg via INTRAVENOUS

## 2021-04-05 MED ORDER — FENTANYL CITRATE (PF) 100 MCG/2ML IJ SOLN
25.0000 ug | INTRAMUSCULAR | Status: DC | PRN
  Administered 2021-04-05 (×4): 25 ug via INTRAVENOUS

## 2021-04-05 MED ORDER — MIDAZOLAM HCL 2 MG/2ML IJ SOLN
INTRAMUSCULAR | Status: DC | PRN
  Administered 2021-04-05: 2 mg via INTRAVENOUS

## 2021-04-05 MED ORDER — PROPOFOL 10 MG/ML IV BOLUS
INTRAVENOUS | Status: AC
  Filled 2021-04-05: qty 20

## 2021-04-05 MED ORDER — FENTANYL CITRATE (PF) 250 MCG/5ML IJ SOLN
INTRAMUSCULAR | Status: AC
  Filled 2021-04-05: qty 5

## 2021-04-05 SURGICAL SUPPLY — 24 items
BAG COUNTER SPONGE SURGICOUNT (BAG) ×2 IMPLANT
BNDG GAUZE ELAST 4 BULKY (GAUZE/BANDAGES/DRESSINGS) IMPLANT
CANISTER SUCT 3000ML PPV (MISCELLANEOUS) ×2 IMPLANT
CNTNR URN SCR LID CUP LEK RST (MISCELLANEOUS) ×1 IMPLANT
CONT SPEC 4OZ STRL OR WHT (MISCELLANEOUS) ×1
COVER SURGICAL LIGHT HANDLE (MISCELLANEOUS) ×2 IMPLANT
DRAPE LAPAROSCOPIC ABDOMINAL (DRAPES) ×2 IMPLANT
ELECT REM PT RETURN 9FT ADLT (ELECTROSURGICAL) ×2
ELECTRODE REM PT RTRN 9FT ADLT (ELECTROSURGICAL) ×1 IMPLANT
GAUZE SPONGE 4X4 12PLY STRL (GAUZE/BANDAGES/DRESSINGS) ×4 IMPLANT
GLOVE SURG ENC MOIS LTX SZ7.5 (GLOVE) ×2 IMPLANT
GOWN STRL REUS W/ TWL LRG LVL3 (GOWN DISPOSABLE) ×2 IMPLANT
GOWN STRL REUS W/TWL LRG LVL3 (GOWN DISPOSABLE) ×2
KIT BASIN OR (CUSTOM PROCEDURE TRAY) ×2 IMPLANT
KIT TURNOVER KIT B (KITS) ×2 IMPLANT
NEEDLE HYPO 25GX1X1/2 BEV (NEEDLE) ×2 IMPLANT
NS IRRIG 1000ML POUR BTL (IV SOLUTION) ×2 IMPLANT
PACK GENERAL/GYN (CUSTOM PROCEDURE TRAY) ×2 IMPLANT
PAD ARMBOARD 7.5X6 YLW CONV (MISCELLANEOUS) ×2 IMPLANT
SWAB COLLECTION DEVICE MRSA (MISCELLANEOUS) ×2 IMPLANT
SWAB CULTURE ESWAB REG 1ML (MISCELLANEOUS) ×2 IMPLANT
SYR CONTROL 10ML LL (SYRINGE) ×2 IMPLANT
TOWEL GREEN STERILE (TOWEL DISPOSABLE) ×2 IMPLANT
TOWEL GREEN STERILE FF (TOWEL DISPOSABLE) ×2 IMPLANT

## 2021-04-05 NOTE — Transfer of Care (Signed)
Immediate Anesthesia Transfer of Care Note  Patient: Timothy [name]  Procedure(s) Performed: INCISION AND DRAINAGE ABDOMINAL WALL ABSCESS (Abdomen)  Patient Location: PACU  Anesthesia Type:General  Level of Consciousness: awake, patient cooperative and responds to stimulation  Airway & Oxygen Therapy: Patient Spontanous Breathing and Patient connected to nasal cannula oxygen  Post-op Assessment: Report given to RN, Post -op Vital signs reviewed and stable and Patient moving all extremities X 4  Post vital signs: Reviewed and stable  Last Vitals:  Vitals Value Taken Time  BP 125/85 04/05/21 0936  Temp    Pulse 96 04/05/21 0936  Resp 17 04/05/21 0936  SpO2 100 % 04/05/21 0936  Vitals shown include unvalidated device data.  Last Pain:  Vitals:   04/05/21 0715  PainSc: 0-No pain      Patients Stated Pain Goal: 0 ([id number])  Complications: No notable events documented.

## 2021-04-05 NOTE — Interval H&P Note (Signed)
History and Physical Interval Note:  04/05/2021 8:08 AM  Timothy [name]  has presented today for surgery, with the diagnosis of abdominal wall abscess.  The various methods of treatment have been discussed with the patient and family. After consideration of risks, benefits and other options for treatment, the patient has consented to  Procedure(s): INCISION AND DRAINAGE ABDOMINAL WALL ABSCESS (N/A) as a surgical intervention.  The patient's history has been reviewed, patient examined, no change in status, stable for surgery.  I have reviewed the patient's chart and labs.  Questions were answered to the patient's satisfaction.     [name]

## 2021-04-05 NOTE — Anesthesia Postprocedure Evaluation (Signed)
Anesthesia Post Note  Patient: Timothy [name]  Procedure(s) Performed: INCISION AND DRAINAGE ABDOMINAL WALL ABSCESS (Abdomen)     Patient location during evaluation: PACU Anesthesia Type: General Level of consciousness: awake Pain management: pain level controlled Vital Signs Assessment: post-procedure vital signs reviewed and stable Respiratory status: spontaneous breathing and respiratory function stable Cardiovascular status: stable Postop Assessment: no apparent nausea or vomiting Anesthetic complications: no   No notable events documented.  Last Vitals:  Vitals:   04/05/21 0950 04/05/21 1005  BP: 119/80 124/79  Pulse: 94 94  Resp: 17 18  Temp:  (!) 36.2 C  SpO2: 99% 99%    Last Pain:  Vitals:   04/05/21 1005  PainSc: 4                  Candra R Gilbert [place]

## 2021-04-05 NOTE — Op Note (Signed)
04/05/2021  9:16 AM  PATIENT:  Timothy [name]  56 y.o. male  PRE-OPERATIVE DIAGNOSIS:  abdominal wall abscess  POST-OPERATIVE DIAGNOSIS:  abdominal wall abscess  PROCEDURE:  Procedure(s): INCISION AND DRAINAGE ABDOMINAL WALL ABSCESS (N/A)  SURGEON:  Surgeon(s) and Role:    * Jovita Kussmaul, MD - Primary  PHYSICIAN ASSISTANT:   ASSISTANTS: none   ANESTHESIA:   local and general  EBL:  minimal   BLOOD ADMINISTERED:none  DRAINS: none   LOCAL MEDICATIONS USED:  MARCAINE     SPECIMEN:  Source of Specimen:  cultures and piece of chronic  abd wall abscess   DISPOSITION OF SPECIMEN:  PATHOLOGY  COUNTS:  YES  TOURNIQUET:  * No tourniquets in log *  DICTATION: .Dragon Dictation  After informed consent was obtained the patient was brought to the operating room and placed in the supine position on the operating table.  After adequate induction of general anesthesia the patient's abdomen was prepped with ChloraPrep, allowed to dry, and draped in usual sterile manner.  An appropriate timeout was performed.  The patient had a swollen area just to the left of midline in his suprapubic region.  The area overlying this was infiltrated with quarter percent Marcaine.  An 18-gauge needle and 10 cc syringe were used to try to aspirate fluid from the area but could not find any fluid.  I then made a small vertically oriented incision overlying the swollen area with the 15 blade knife.  The incision was carried through the skin and subcutaneous tissue sharply with the electrocautery.  Once I entered the cavity appeared as though the cavity was filled with chronic granulation tissue but no fluid.  Cultures were obtained.  The cavity tracked superiorly and I was able to identify previously placed Novafil stitch.  I removed the stitch completely.  I debrided the cavity as best I could and then hemostasis was achieved using the Bovie electrocautery.  I then packed the cavity with half-inch Nu Gauze.   Sterile dressings were applied.  The patient tolerated the procedure well.  At the end of the case all needle sponge and instrument counts were correct.  The patient was then awakened and taken to recovery in stable condition.  PLAN OF CARE: Discharge to home after PACU  PATIENT DISPOSITION:  PACU - hemodynamically stable.   Delay start of Pharmacological VTE agent (>24hrs) due to surgical blood loss or risk of bleeding: not applicable

## 2021-04-05 NOTE — Anesthesia Procedure Notes (Addendum)
Procedure Name: Intubation Date/Time: 04/05/2021 8:42 AM Performed by: Michele Rockers, CRNA Pre-anesthesia Checklist: Patient identified, Patient being monitored, Timeout performed, Emergency Drugs available and Suction available Patient Re-evaluated:Patient Re-evaluated prior to induction Oxygen Delivery Method: Circle System Utilized Preoxygenation: Pre-oxygenation with 100% oxygen Induction Type: IV induction Ventilation: Oral airway inserted - appropriate to patient size Laryngoscope Size: Miller and 3 Grade View: Grade I Tube type: Oral Tube size: 8.0 mm Number of attempts: 1 Airway Equipment and Method: Stylet Placement Confirmation: ETT inserted through vocal cords under direct vision, positive ETCO2 and breath sounds checked- equal and bilateral Secured at: 23 cm Tube secured with: Tape Dental Injury: Teeth and Oropharynx as per pre-operative assessment

## 2021-04-05 NOTE — H&P (Signed)
MRN: [id number] DOB: 02-Jul-1966  Subjective   Chief Complaint: Mass (Mass in pubic area)   History of Present Illness: Timothy [name] is a 55 y.o. male who is seen today for abdominal swelling. The patient has a 55 year old white male who is about 8 years status post ventral hernia repair with mesh. He is about 5 years status post excision of a stitch and some unincorporated mesh from the lower abdominal wall. He has had several episodes over the years of a small abscess along the lower abdominal wall that has responded to doxycycline. Over the last month he has had more swelling and pain in the same area. He had a CT scan 1 month ago that showed a very small fluid collection in the soft tissue of the lower abdominal wall. He has quite a bit more swelling today. He denies any nausea or vomiting. He denies any fevers or chills.  Review of Systems: A complete review of systems was obtained from the patient. I have reviewed this information and discussed as appropriate with the patient. See HPI as well for other ROS.  Review of Systems  Constitutional: Negative.  HENT: Negative.  Eyes: Negative.  Respiratory: Negative.  Cardiovascular: Negative.  Gastrointestinal: Negative.  Genitourinary: Negative.  Musculoskeletal: Negative.  Skin: Negative.  Neurological: Negative.  Endo/Heme/Allergies: Negative.  Psychiatric/Behavioral: Negative.    Medical History: History reviewed. No pertinent past medical history.  Patient Active Problem List  Diagnosis   Abdominal wall abscess   History reviewed. No pertinent surgical history.   Allergies  Allergen Reactions   Lisinopril Cough   Mupirocin Calcium Swelling  Caused infection at a surgical site   Propoxyphene N-Acetaminophen Hives and Itching   Current Outpatient Medications on File Prior to Visit  Medication Sig Dispense Refill   cyclobenzaprine (FLEXERIL) 10 MG tablet TAKE 1 TABLET BY MOUTH EVERY DAY AS NEEDED FOR MUSCLE SPASMS    gabapentin (NEURONTIN) 100 MG capsule   No current facility-administered medications on file prior to visit.   History reviewed. No pertinent family history.   Social History   Tobacco Use  Smoking Status Current Every Day Smoker   Packs/day: 1.00   Years: 30.00   Pack years: 30.00   Types: Cigarettes  Smokeless Tobacco Former Systems developer    Social History   Socioeconomic History   Marital status: Single  Tobacco Use   Smoking status: Current Every Day Smoker  Packs/day: 1.00  Years: 30.00  Pack years: 30.00  Types: Cigarettes   Smokeless tobacco: Former Systems developer   Objective:   There were no vitals filed for this visit.  There is no height or weight on file to calculate BMI.  Physical Exam Constitutional:  General: He is not in acute distress. Appearance: Normal appearance.  HENT:  Head: Normocephalic and atraumatic.  Right Ear: External ear normal.  Left Ear: External ear normal.  Nose: Nose normal.  Mouth/Throat:  Mouth: Mucous membranes are moist.  Pharynx: Oropharynx is clear.  Eyes:  General: No scleral icterus. Extraocular Movements: Extraocular movements intact.  Conjunctiva/sclera: Conjunctivae normal.  Pupils: Pupils are equal, round, and reactive to light.  Cardiovascular:  Rate and Rhythm: Normal rate and regular rhythm.  Pulses: Normal pulses.  Heart sounds: Normal heart sounds.  Pulmonary:  Effort: Pulmonary effort is normal. No respiratory distress.  Breath sounds: Normal breath sounds.  Abdominal:  Palpations: Abdomen is soft.  Comments: There is a swollen area along the midline lower abdominal wall measuring about 6 cm that feels like a  tight fluid collection. There is no overlying cellulitis.  Musculoskeletal:  General: No swelling or deformity. Normal range of motion.  Cervical back: Normal range of motion and neck supple. No tenderness.  Skin: General: Skin is warm and dry.  Coloration: Skin is not jaundiced.  Neurological:  General: No  focal deficit present.  Mental Status: He is alert and oriented to person, place, and time.  Psychiatric:  Mood and Affect: Mood normal.  Behavior: Behavior normal.     Labs, Imaging and Diagnostic Testing:  Assessment and Plan:  Diagnoses and all orders for this visit:  Abdominal wall abscess - CT abdomen pelvis with contrast; Future    The patient appears to have developed a much larger fluid collection along the lower abdominal wall. It is not clear where this is coming from. Given that it is much larger than it was 1 month ago I would recommend repeating the CT scan. I suspect that he will need this incised and drained and probed in the operating room. We will use the CT scan to help develop treatment plan for this. We will call him with the results of the study and then proceed accordingly.

## 2021-04-06 ENCOUNTER — Encounter (HOSPITAL_COMMUNITY): Payer: Self-pay | Admitting: General Surgery

## 2021-04-06 LAB — SURGICAL PATHOLOGY

## 2021-04-10 LAB — ANAEROBIC CULTURE W GRAM STAIN

## 2021-04-30 ENCOUNTER — Other Ambulatory Visit: Payer: Self-pay

## 2021-04-30 MED ORDER — CYCLOBENZAPRINE HCL 10 MG PO TABS: ORAL_TABLET | ORAL | 0 refills | Status: DC

## 2021-05-05 LAB — FUNGUS CULTURE WITH STAIN

## 2021-05-05 LAB — FUNGAL ORGANISM REFLEX

## 2021-05-05 LAB — FUNGUS CULTURE RESULT

## 2021-05-07 ENCOUNTER — Other Ambulatory Visit: Payer: Self-pay

## 2021-05-08 ENCOUNTER — Ambulatory Visit (INDEPENDENT_AMBULATORY_CARE_PROVIDER_SITE_OTHER): Payer: Medicare Other | Admitting: Student

## 2021-05-08 ENCOUNTER — Encounter: Payer: Self-pay | Admitting: Student

## 2021-05-08 ENCOUNTER — Other Ambulatory Visit: Payer: Self-pay

## 2021-05-08 VITALS — BP 128/76 | HR 100 | Ht 70.0 in | Wt 171.6 lb

## 2021-05-08 DIAGNOSIS — J441 Chronic obstructive pulmonary disease with (acute) exacerbation: Secondary | ICD-10-CM | POA: Diagnosis not present

## 2021-05-08 DIAGNOSIS — G894 Chronic pain syndrome: Secondary | ICD-10-CM | POA: Diagnosis not present

## 2021-05-08 DIAGNOSIS — F339 Major depressive disorder, recurrent, unspecified: Secondary | ICD-10-CM | POA: Diagnosis present

## 2021-05-08 DIAGNOSIS — F410 Panic disorder [episodic paroxysmal anxiety] without agoraphobia: Secondary | ICD-10-CM

## 2021-05-08 DIAGNOSIS — R0602 Shortness of breath: Secondary | ICD-10-CM

## 2021-05-08 MED ORDER — GABAPENTIN 100 MG PO CAPS: ORAL_CAPSULE | ORAL | 3 refills | Status: DC

## 2021-05-08 MED ORDER — SERTRALINE HCL 50 MG PO TABS: 50.0000 mg | ORAL_TABLET | Freq: Every day | ORAL | 3 refills | Status: DC

## 2021-05-08 MED ORDER — BUSPIRONE HCL 15 MG PO TABS: 15.0000 mg | ORAL_TABLET | Freq: Every day | ORAL | 2 refills | Status: DC

## 2021-05-08 MED ORDER — ALBUTEROL SULFATE HFA 108 (90 BASE) MCG/ACT IN AERS: INHALATION_SPRAY | RESPIRATORY_TRACT | 2 refills | Status: DC

## 2021-05-08 NOTE — Patient Instructions (Signed)
It was great to see you! Thank you for allowing me to participate in your care!   I recommend that you always bring your medications to each appointment as this makes it easy to ensure we are on the correct medications and helps [place] not miss when refills are needed.  Our plans for today:  - I started you on sertraline 50 mg once a day for depression. It takes 4-6 weeks for effects to kick in but I would like to follow up in a month if it is helping you! Let us know if there is any worsening of your symptoms. - I refilled your gabapentin, albuterol, and buspar   Take care and seek immediate care sooner if you develop any concerns. Please remember to show up 15 minutes before your scheduled appointment time!  Gerrit Heck, MD [place]

## 2021-05-08 NOTE — Progress Notes (Signed)
    SUBJECTIVE:   CHIEF COMPLAINT / HPI: Mood symptoms and medication refills  Depression/anxiety: Patient has become mother's main caretaker as she is in hospice care for the past 3 weeks.  His father passed away in 09-23-22 of this year.  He has also recently had his eighth abdominal surgery and has found it hard to take care of himself along with his mother.  He has his son to help him however it has been overwhelming for him with the monitor changes.  He has missed appointments due to issues with caring for his mother.  He has sometimes been taking 2 BuSpar instead of 1 however it is not helping him even when taking more. He said he has tried sertraline about 10 years ago and found it very effective for him.    PERTINENT  PMH / PSH: Chronic pain syndrome, panic anxiety syndrome  OBJECTIVE:   BP 128/76   Pulse 100   Ht 5\' 10"  (1.778 m)   Wt 171 lb 9.6 oz (77.8 kg)   SpO2 95%   BMI 24.62 kg/m   General: NAD, awake, alert, responsive to questions Head: Normocephalic atraumatic CV: Regular rate and rhythm no murmurs rubs or gallops Respiratory: no increased work of breathing, and expiratory wheezes in lower posterior lung bases bilaterally Abdomen: Soft, non-tender, non-distended, mildly hyperactive bowel sounds, multiple scars present, recent surgical scar in lower abdomen without fluctuance or drainage Extremities: Moves upper and lower extremities freely, gait mildly limited by chronic pain  ASSESSMENT/PLAN:   Chronic pain syndrome Chronic abdominal pain, currently taking gabapentin 100 mg twice daily and Flexeril 10 mg as needed daily. -Refilled gabapentin 100 mg twice daily -Continue Flexeril 10 mg daily as needed  Depression, recurrent ([place]) Patient requested sertraline as this has helped him in the past.  Does not attest to any manic symptoms.  Currently does not want additional therapy and gets counseling through mother's hospice service. -Started 50 mg sertraline  daily -Continue BuSpar 15 mg daily -Follow-up in 4 weeks to see if any improvements     Gerrit Heck, MD [place]

## 2021-05-08 NOTE — Assessment & Plan Note (Addendum)
Chronic abdominal pain, currently taking gabapentin 100 mg twice daily and Flexeril 10 mg as needed daily. -Refilled gabapentin 100 mg twice daily -Continue Flexeril 10 mg daily as needed

## 2021-05-08 NOTE — Assessment & Plan Note (Signed)
Patient requested sertraline as this has helped him in the past.  Does not attest to any manic symptoms.  Currently does not want additional therapy and gets counseling through mother's hospice service. -Started 50 mg sertraline daily -Continue BuSpar 15 mg daily -Follow-up in 4 weeks to see if any improvements

## 2021-05-10 ENCOUNTER — Other Ambulatory Visit: Payer: Self-pay

## 2021-05-10 DIAGNOSIS — G894 Chronic pain syndrome: Secondary | ICD-10-CM

## 2021-05-31 ENCOUNTER — Ambulatory Visit: Payer: Medicare Other

## 2021-05-31 NOTE — Progress Notes (Deleted)
    SUBJECTIVE:   CHIEF COMPLAINT / HPI:   Sore Throat  Ear Ache Timothy [name] is a 55 y.o. male who presents to the clinic today for complaints of ***   PERTINENT  PMH / PSH:  Chronic pain syndrome, HTN, COPD, bipolar disorder, history of tobacco abuse, alcohol abuse, cocaine abuse   OBJECTIVE:   There were no vitals taken for this visit. ***   General: NAD, pleasant, able to participate in exam HEENT: No pharyngeal erythema,*** Cardiac: RRR, no murmurs. Respiratory: CTAB, normal effort, No wheezes, rales or rhonchi Abdomen: Bowel sounds present, nontender Skin: warm and dry, no rashes noted  ASSESSMENT/PLAN:   No problem-specific Assessment & Plan notes found for this encounter.   Assessment: 55 y.o. male with symptoms consistent with a viral upper respiratory tract infection.  Patient does not have any concerning symptoms.  No shortness of breath, fever, chest pain, oxygen requirement***.  Overall differential can include seasonal allergies with postnasal drip leading to the cough and runny nose versus viral URI which can include common cold, influenza, COVID-19, or number of other respiratory viruses.  Considered bacterial infection but given well-appearance, stable vitals and history, feel that this is less likely, though the patient could potentially develop sinus infection due to congestion. Plan: -We will send for COVID-19 testing -Discussed return precautions -Discussed symptomatic treatment and the lack of need for antibiotics.  Sharion Settler, [place]

## 2021-05-31 NOTE — Patient Instructions (Incomplete)
It was wonderful to see you today. ? ?Please bring ALL of your medications with you to every visit.  ? ?Today we talked about: ? ?** ? ? ?Thank you for choosing [facility].  ? ?Please call [phone] with any questions about today's appointment. ? ?Please be sure to schedule follow up at the front  desk before you leave today.  ? ?Delesa Kawa, DO ?PGY-2 Family Medicine   ?

## 2021-06-05 ENCOUNTER — Other Ambulatory Visit: Payer: Self-pay | Admitting: Student

## 2021-06-12 NOTE — Patient Instructions (Addendum)
It was great to see you! Thank you for allowing me to participate in your care!   I recommend that you always bring your medications to each appointment as this makes it easy to ensure we are on the correct medications and helps [place] not miss when refills are needed.  Our plans for today:  - we increased to 100 mg sertraline daily, continue BuSpar 15 mg daily for depression/anxiety - I am glad you are feeling better, we will plan for follow up in January!  Take care and seek immediate care sooner if you develop any concerns. Please remember to show up 15 minutes before your scheduled appointment time!  Gerrit Heck, MD [place]

## 2021-06-12 NOTE — Progress Notes (Signed)
    SUBJECTIVE:   CHIEF COMPLAINT / HPI: Depression f/u  Depression Started 50 mg sertraline daily every morning last visit. Continued BuSpar 15 mg daily morning. Here for follow-up 4 weeks after to see if any improvements. Feels like he is doing much better. His girlfriend and mother have noticed that he is much less angry at them. Has had no GI side effects. From 5 pm to bedtime he gets restless, agitated and fidgety. His sleep is somewhat better, he still wakes up around 3:30 but goes back to sleep within an hour and sometimes sleeps the whole night. His appetite is much better as well. He feels more anxious than sad at night. No SI/HI. He receives therapy through a chaplain and social worker who come to his house once a week as his mom is on hospice care. He feels good right now just would like some adjustment. There is a hospice nurse that helps 3x week to bathe his mom but he is her sole caregiver. He is excited for thanksgiving as his mom will be cared for by his brother for a few days.  Substance Use Still smoking daily but is not interested in quitting or decreasing currently. Alcohol use is about 2 beers every other day to a week. Uses marijuana at night every week or two. Denies any other illicit drug use or IV drug use.  PERTINENT  PMH / PSH: alcohol use disorder, cocaine use disorder, diverticulitis  OBJECTIVE:   BP 126/78   Pulse 93   Ht 5\' 10"  (1.778 m)   Wt 178 lb 6.4 oz (80.9 kg)   SpO2 95%   BMI 25.60 kg/m   General: Well appearing, NAD, awake, alert, responsive to questions Head: Normocephalic atraumatic CV: Regular rate and rhythm no murmurs rubs or gallops Respiratory: Clear to ausculation bilaterally, no wheezes rales or crackles, chest rises symmetrically,  no increased work of breathing Abdomen: Soft, non-tender, non-distended,  ASSESSMENT/PLAN:   Depression, recurrent (HCC) Improved significantly from last visit with sertraline 50 mg. Still had some anxiety  from around 5 pm until he goes to sleep. PHQ today was 7. GAD-7 was 10. Shared decision making on whether increasing sertraline dose or bid buspirone, patient opted for increasing sertraline. -increased to 100 mg sertraline  -continue buspar 15 mg daily -F/u in january  Tobacco use Not interested in smoking cessation currently. Counseled.  History of alcohol abuse Says 2 beers every other day or weekly.     Gerrit Heck, MD [place]

## 2021-06-13 ENCOUNTER — Encounter: Payer: Self-pay | Admitting: Student

## 2021-06-13 ENCOUNTER — Ambulatory Visit (INDEPENDENT_AMBULATORY_CARE_PROVIDER_SITE_OTHER): Payer: Medicare Other | Admitting: Student

## 2021-06-13 ENCOUNTER — Other Ambulatory Visit: Payer: Self-pay

## 2021-06-13 VITALS — BP 126/78 | HR 93 | Ht 70.0 in | Wt 178.4 lb

## 2021-06-13 DIAGNOSIS — Z720 Tobacco use: Secondary | ICD-10-CM

## 2021-06-13 DIAGNOSIS — F1011 Alcohol abuse, in remission: Secondary | ICD-10-CM | POA: Diagnosis not present

## 2021-06-13 DIAGNOSIS — F339 Major depressive disorder, recurrent, unspecified: Secondary | ICD-10-CM | POA: Diagnosis not present

## 2021-06-13 MED ORDER — SERTRALINE HCL 100 MG PO TABS
100.0000 mg | ORAL_TABLET | Freq: Every day | ORAL | 3 refills | Status: DC
Start: 2021-06-13 — End: 2021-11-12

## 2021-06-13 NOTE — Assessment & Plan Note (Signed)
Not interested in smoking cessation currently. Counseled.

## 2021-06-13 NOTE — Assessment & Plan Note (Signed)
Improved significantly from last visit with sertraline 50 mg. Still had some anxiety from around 5 pm until he goes to sleep. PHQ today was 7. GAD-7 was 10. Shared decision making on whether increasing sertraline dose or bid buspirone, patient opted for increasing sertraline. -increased to 100 mg sertraline  -continue buspar 15 mg daily -F/u in january

## 2021-06-13 NOTE — Assessment & Plan Note (Signed)
Says 2 beers every other day or weekly.

## 2021-07-06 ENCOUNTER — Other Ambulatory Visit: Payer: Self-pay | Admitting: Family Medicine

## 2021-07-19 ENCOUNTER — Ambulatory Visit (INDEPENDENT_AMBULATORY_CARE_PROVIDER_SITE_OTHER): Payer: Medicare Other | Admitting: Family Medicine

## 2021-07-19 ENCOUNTER — Other Ambulatory Visit: Payer: Self-pay

## 2021-07-19 ENCOUNTER — Encounter: Payer: Self-pay | Admitting: Family Medicine

## 2021-07-19 VITALS — BP 136/92 | HR 113 | Temp 98.2°F | Ht 70.0 in | Wt 174.4 lb

## 2021-07-19 DIAGNOSIS — R059 Cough, unspecified: Secondary | ICD-10-CM

## 2021-07-19 DIAGNOSIS — J441 Chronic obstructive pulmonary disease with (acute) exacerbation: Secondary | ICD-10-CM

## 2021-07-19 MED ORDER — ALBUTEROL SULFATE (2.5 MG/3ML) 0.083% IN NEBU
2.5000 mg | INHALATION_SOLUTION | Freq: Once | RESPIRATORY_TRACT | Status: AC
  Administered 2021-07-19: 16:00:00 2.5 mg via RESPIRATORY_TRACT

## 2021-07-19 MED ORDER — PREDNISONE 20 MG PO TABS: 40.0000 mg | ORAL_TABLET | Freq: Every day | ORAL | 0 refills | Status: DC

## 2021-07-19 MED ORDER — AZITHROMYCIN 250 MG PO TABS: ORAL_TABLET | ORAL | 0 refills | Status: DC

## 2021-07-19 MED ORDER — IPRATROPIUM BROMIDE 0.02 % IN SOLN
0.5000 mg | Freq: Once | RESPIRATORY_TRACT | Status: AC
  Administered 2021-07-19: 16:00:00 0.5 mg via RESPIRATORY_TRACT

## 2021-07-19 NOTE — Patient Instructions (Addendum)
I suspect you are having a COPD bronchitis attack.   We are testing for CoViD today.    Start the Prednisone 20 mg tablet, two tablets each day.  Take the first dose once you pick up the prescription.  Start The Z-Pak (Azithromycin) as soon as you pick up the prescription.    If you start feeling worse, please go to an Urgent Care to be re-evaluated.   Chronic Obstructive Pulmonary Disease Chronic obstructive pulmonary disease (COPD) is a long-term (chronic) lung problem. When you have COPD, it is hard for air to get in and out of your lungs. Usually the condition gets worse over time, and your lungs will never return to normal. There are things you can do to keep yourself as healthy as possible. What are the causes? Smoking. This is the most common cause. Certain genes passed from parent to child (inherited). What increases the risk? Being exposed to secondhand smoke from cigarettes, pipes, or cigars. Being exposed to chemicals and other irritants, such as fumes and dust in the work environment. Having chronic lung conditions or infections. What are the signs or symptoms? Shortness of breath, especially during physical activity. A long-term cough with a large amount of thick mucus. Sometimes, the cough may not have any mucus (dry cough). Wheezing. Breathing quickly. Skin that looks gray or blue, especially in the fingers, toes, or lips. Feeling tired (fatigue). Weight loss. Chest tightness. Having infections often. Episodes when breathing symptoms become much worse (exacerbations). At the later stages of this disease, you may have swelling in the ankles, feet, or legs. How is this treated? Taking medicines. Quitting smoking, if you smoke. Rehabilitation. This includes steps to make your body work better. It may involve a team of specialists. Doing exercises. Making changes to your diet. Using oxygen. Lung surgery. Lung transplant. Comfort measures (palliative care). Follow  these instructions at home: Medicines Take over-the-counter and prescription medicines only as told by your doctor. Talk to your doctor before taking any cough or allergy medicines. You may need to avoid medicines that cause your lungs to be dry. Lifestyle If you smoke, stop smoking. Smoking makes the problem worse. Do not smoke or use any products that contain nicotine or tobacco. If you need help quitting, ask your doctor. Avoid being around things that make your breathing worse. This may include smoke, chemicals, and fumes. Stay active, but remember to rest as well. Learn and use tips on how to manage stress and control your breathing. Make sure you get enough sleep. Most adults need at least 7 hours of sleep every night. Eat healthy foods. Eat smaller meals more often. Rest before meals. Controlled breathing Learn and use tips on how to control your breathing as told by your doctor. Try: Breathing in (inhaling) through your nose for 1 second. Then, pucker your lips and breath out (exhale) through your lips for 2 seconds. Putting one hand on your belly (abdomen). Breathe in slowly through your nose for 1 second. Your hand on your belly should move out. Pucker your lips and breathe out slowly through your lips. Your hand on your belly should move in as you breathe out.  Controlled coughing Learn and use controlled coughing to clear mucus from your lungs. Follow these steps: Lean your head a little forward. Breathe in deeply. Try to hold your breath for 3 seconds. Keep your mouth slightly open while coughing 2 times. Spit any mucus out into a tissue. Rest and do the steps again 1 or  2 times as needed. General instructions Make sure you get all the shots (vaccines) that your doctor recommends. Ask your doctor about a flu shot and a pneumonia shot. Use oxygen therapy and pulmonary rehabilitation if told by your doctor. If you need home oxygen therapy, ask your doctor if you should buy a  tool to measure your oxygen level (oximeter). Make a COPD action plan with your doctor. This helps you to know what to do if you feel worse than usual. Manage any other conditions you have as told by your doctor. Avoid going outside when it is very hot, cold, or humid. Avoid people who have a sickness you can catch (contagious). Keep all follow-up visits. Contact a doctor if: You cough up more mucus than usual. There is a change in the color or thickness of the mucus. It is harder to breathe than usual. Your breathing is faster than usual. You have trouble sleeping. You need to use your medicines more often than usual. You have trouble doing your normal activities such as getting dressed or walking around the house. Get help right away if: You have shortness of breath while resting. You have shortness of breath that stops you from: Being able to talk. Doing normal activities. Your chest hurts for longer than 5 minutes. Your skin color is more blue than usual. Your pulse oximeter shows that you have low oxygen for longer than 5 minutes. You have a fever. You feel too tired to breathe normally. These symptoms may represent a serious problem that is an emergency. Do not wait to see if the symptoms will go away. Get medical help right away. Call your local emergency services (911 in the [place] Do not drive yourself to the hospital. Summary Chronic obstructive pulmonary disease (COPD) is a long-term lung problem. The way your lungs work will never return to normal. Usually the condition gets worse over time. There are things you can do to keep yourself as healthy as possible. Take over-the-counter and prescription medicines only as told by your doctor. If you smoke, stop. Smoking makes the problem worse. This information is not intended to replace advice given to you by your health care provider. Make sure you discuss any questions you have with your health care provider. Document Revised:  05/16/2020 Document Reviewed: 05/16/2020 Elsevier Patient Education  2022 [employer].

## 2021-07-20 ENCOUNTER — Encounter: Payer: Self-pay | Admitting: Family Medicine

## 2021-07-20 LAB — SARS-COV-2, NAA 2 DAY TAT

## 2021-07-20 LAB — NOVEL CORONAVIRUS, NAA: SARS-CoV-2, NAA: NOT DETECTED

## 2021-07-20 NOTE — Assessment & Plan Note (Signed)
Recurrent problem Cough for several days, (+) sputum with scant blood streaks SHOB(+) (+) nasal congestion, (+) sore throat No fever/chills/sweats, No headache, no myalgias No known Flu/Covid exposures  Covid swab collected  Mildly ill-appearing, Talking in full sentences Cor: tach, no M/R/G Lung: diffuse polyphonic wheezing bilaterally, no acc mm use, no crackles Ext: no edema  A/ Presumed AECOPD P/ Prednisone 40 mg daily x 7 days Z-Pak Return precautions,  F/U Covid test

## 2021-07-20 NOTE — Progress Notes (Signed)
Timothy [name] is alone Sources of clinical information for visit is/are patient and past medical records. Nursing assessment for this office visit was reviewed with the patient for accuracy and revision.     Previous Report(s) Reviewed: none  Depression screen PHQ 2/9 07/19/2021  Decreased Interest 1  Down, Depressed, Hopeless 1  PHQ - 2 Score 2  Altered sleeping 2  Tired, decreased energy 1  Change in appetite 0  Feeling bad or failure about yourself  0  Trouble concentrating 1  Moving slowly or fidgety/restless 1  Suicidal thoughts 0  PHQ-9 Score 7  Difficult doing work/chores -  Some recent data might be hidden   [place] Visit from 07/19/2021 in [place] Office Visit from 06/13/2021 in [place] Office Visit from 12/14/2020 in [facility]  Thoughts that you would be better off dead, or of hurting yourself in some way Not at all Not at all Not at all  PHQ-9 Total Score 7 7 9        Fall Risk  06/09/2020 07/05/2019 05/18/2019 05/05/2019 05/04/2019  Falls in the past year? 0 0 0 0 0  Number falls in past yr: 0 0 0 0 0  Injury with Fall? 0 - - - -  Risk for fall due to : - - - - -  Risk for fall due to: Comment - - - - -  Follow up - - Falls evaluation completed - Falls evaluation completed    PHQ9 SCORE ONLY 07/19/2021 06/13/2021 12/14/2020  PHQ-9 Total Score 7 7 9     Adult vaccines due  Topic Date Due   TETANUS/TDAP  06/28/2016    Health Maintenance Due  Topic Date Due   Hepatitis C Screening  Never done   Pneumococcal Vaccine 59-72 Years old (2 - PCV) 10/28/2012   TETANUS/TDAP  06/28/2016   Zoster Vaccines- Shingrix (1 of 2) Never done      History/P.E. limitations: none  Adult vaccines due  Topic Date Due   TETANUS/TDAP  06/28/2016   There are no preventive care reminders to display for this patient.  Health Maintenance Due  Topic Date Due   Hepatitis C Screening  Never  done   Pneumococcal Vaccine 76-21 Years old (2 - PCV) 10/28/2012   TETANUS/TDAP  06/28/2016   Zoster Vaccines- Shingrix (1 of 2) Never done     Chief Complaint  Patient presents with   Emesis

## 2021-07-24 ENCOUNTER — Emergency Department (HOSPITAL_COMMUNITY)
Admission: EM | Admit: 2021-07-24 | Discharge: 2021-07-24 | Disposition: A | Discharge status: Home / Self Care | Payer: Medicare Other | Attending: Emergency Medicine | Admitting: Emergency Medicine

## 2021-07-24 ENCOUNTER — Encounter (HOSPITAL_COMMUNITY): Payer: Self-pay | Admitting: Emergency Medicine

## 2021-07-24 ENCOUNTER — Emergency Department (HOSPITAL_COMMUNITY): Payer: Medicare Other

## 2021-07-24 ENCOUNTER — Other Ambulatory Visit: Payer: Self-pay

## 2021-07-24 DIAGNOSIS — Z20822 Contact with and (suspected) exposure to covid-19: Secondary | ICD-10-CM | POA: Insufficient documentation

## 2021-07-24 DIAGNOSIS — R0602 Shortness of breath: Secondary | ICD-10-CM | POA: Diagnosis not present

## 2021-07-24 DIAGNOSIS — Z5321 Procedure and treatment not carried out due to patient leaving prior to being seen by health care provider: Secondary | ICD-10-CM | POA: Insufficient documentation

## 2021-07-24 DIAGNOSIS — R059 Cough, unspecified: Secondary | ICD-10-CM | POA: Insufficient documentation

## 2021-07-24 DIAGNOSIS — R042 Hemoptysis: Secondary | ICD-10-CM | POA: Diagnosis present

## 2021-07-24 LAB — TYPE AND SCREEN
ABO/RH(D): O POS
Antibody Screen: NEGATIVE

## 2021-07-24 LAB — CBC WITH DIFFERENTIAL/PLATELET
Abs Immature Granulocytes: 0.21 10*3/uL — ABNORMAL HIGH (ref 0.00–0.07)
Basophils Absolute: 0 10*3/uL (ref 0.0–0.1)
Basophils Relative: 0 %
Eosinophils Absolute: 0 10*3/uL (ref 0.0–0.5)
Eosinophils Relative: 0 %
HCT: 43.2 % (ref 39.0–52.0)
Hemoglobin: 14.1 g/dL (ref 13.0–17.0)
Immature Granulocytes: 2 %
Lymphocytes Relative: 22 %
Lymphs Abs: 2.8 10*3/uL (ref 0.7–4.0)
MCH: 31.6 pg (ref 26.0–34.0)
MCHC: 32.6 g/dL (ref 30.0–36.0)
MCV: 96.9 fL (ref 80.0–100.0)
Monocytes Absolute: 0.6 10*3/uL (ref 0.1–1.0)
Monocytes Relative: 5 %
Neutro Abs: 9.2 10*3/uL — ABNORMAL HIGH (ref 1.7–7.7)
Neutrophils Relative %: 71 %
Platelets: 536 10*3/uL — ABNORMAL HIGH (ref 150–400)
RBC: 4.46 MIL/uL (ref 4.22–5.81)
RDW: 13 % (ref 11.5–15.5)
WBC: 12.8 10*3/uL — ABNORMAL HIGH (ref 4.0–10.5)
nRBC: 0 % (ref 0.0–0.2)

## 2021-07-24 LAB — COMPREHENSIVE METABOLIC PANEL
ALT: 21 U/L (ref 0–44)
AST: 21 U/L (ref 15–41)
Albumin: 4.2 g/dL (ref 3.5–5.0)
Alkaline Phosphatase: 76 U/L (ref 38–126)
Anion gap: 16 — ABNORMAL HIGH (ref 5–15)
BUN: 11 mg/dL (ref 6–20)
CO2: 20 mmol/L — ABNORMAL LOW (ref 22–32)
Calcium: 9.7 mg/dL (ref 8.9–10.3)
Chloride: 101 mmol/L (ref 98–111)
Creatinine, Ser: 0.74 mg/dL (ref 0.61–1.24)
GFR, Estimated: >60 mL/min (ref >60)
Glucose, Bld: 111 mg/dL — ABNORMAL HIGH (ref 70–99)
Potassium: 3.9 mmol/L (ref 3.5–5.1)
Sodium: 137 mmol/L (ref 135–145)
Total Bilirubin: 0.6 mg/dL (ref 0.3–1.2)
Total Protein: 8 g/dL (ref 6.5–8.1)

## 2021-07-24 LAB — PROTIME-INR
INR: 0.9 (ref 0.8–1.2)
Prothrombin Time: 12.3 seconds (ref 11.4–15.2)

## 2021-07-24 LAB — D-DIMER, QUANTITATIVE: D-Dimer, Quant: <0.27 ug/mL-FEU (ref 0.00–0.50)

## 2021-07-24 LAB — RESP PANEL BY RT-PCR (FLU A&B, COVID) ARPGX2
Influenza A by PCR: NEGATIVE
Influenza B by PCR: NEGATIVE
SARS Coronavirus 2 by RT PCR: NEGATIVE

## 2021-07-24 LAB — TROPONIN I (HIGH SENSITIVITY): Troponin I (High Sensitivity): 5 ng/L (ref <18)

## 2021-07-24 LAB — LACTIC ACID, PLASMA: Lactic Acid, Venous: 2.7 mmol/L (ref 0.5–1.9)

## 2021-07-24 NOTE — ED Triage Notes (Signed)
Patient reports bloody emesis and bloody phlegm onset last week , currently taking Zithromax for bronchitis .

## 2021-07-24 NOTE — ED Provider Notes (Signed)
Emergency Medicine Provider Triage Evaluation Note  Timothy [name] , a 56 y.o. male  was evaluated in triage.  Pt complains of hemoptysis. He states that he has had a cough.  He was seen and put on azithromycin and steroids.  He has been taking those as prescribed.  He states that he was doing better until today when his shortness of breath got worse and he coughed up blood.  He shows me a picture of what appears to be bright red blood in the toilet bowl.  He denies any blood thinners.  He has a nebulizer at home, he states his last treatment was around 3 PM and that did help slightly.  Review of Systems  Positive: See above Negative:   Physical Exam  BP (!) 151/116    Pulse (!) 125    Temp 98.7 F (37.1 C)    Resp 16    Ht 5\' 9"  (1.753 m)    Wt 85 kg    SpO2 95%    BMI 27.67 kg/m  Gen:   Awake, no distress   Resp:  Normal effort  MSK:   Moves extremities without difficulty  Other:  Rhonchi diffuse bilaterally.   Medical Decision Making  Medically screening exam initiated at 1:40 AM.  Appropriate orders placed.  Timothy [name] was informed that the remainder of the evaluation will be completed by another provider, this initial triage assessment does not replace that evaluation, and the importance of remaining in the ED until their evaluation is complete.  Patient presents today with concern for hemoptysis.  He is currently being treated for COPD flare/bronchitis.  He has been taking azithromycin and prednisone. Will check labs, chest x-ray, EKG.     Lorin Glass, PA-C 07/24/21 0142    Merryl Hacker, MD 07/24/21 [phone]

## 2021-07-25 ENCOUNTER — Ambulatory Visit (INDEPENDENT_AMBULATORY_CARE_PROVIDER_SITE_OTHER): Payer: Medicare Other | Admitting: Student

## 2021-07-25 ENCOUNTER — Other Ambulatory Visit: Payer: Self-pay

## 2021-07-25 ENCOUNTER — Encounter: Payer: Self-pay | Admitting: Student

## 2021-07-25 VITALS — BP 138/124 | HR 110 | Ht 69.0 in | Wt 174.8 lb

## 2021-07-25 DIAGNOSIS — R042 Hemoptysis: Secondary | ICD-10-CM | POA: Insufficient documentation

## 2021-07-25 DIAGNOSIS — K920 Hematemesis: Secondary | ICD-10-CM | POA: Diagnosis not present

## 2021-07-25 DIAGNOSIS — J441 Chronic obstructive pulmonary disease with (acute) exacerbation: Secondary | ICD-10-CM | POA: Diagnosis not present

## 2021-07-25 LAB — BASIC METABOLIC PANEL
BUN/Creatinine Ratio: 29 — ABNORMAL HIGH (ref 9–20)
BUN: 25 mg/dL — ABNORMAL HIGH (ref 6–24)
CO2: 23 mmol/L (ref 20–29)
Calcium: 10.5 mg/dL — ABNORMAL HIGH (ref 8.7–10.2)
Chloride: 99 mmol/L (ref 96–106)
Creatinine, Ser: 0.87 mg/dL (ref 0.76–1.27)
Glucose: 116 mg/dL — ABNORMAL HIGH (ref 70–99)
Potassium: 4.9 mmol/L (ref 3.5–5.2)
Sodium: 137 mmol/L (ref 134–144)
eGFR: 103 mL/min/{1.73_m2} (ref >59)

## 2021-07-25 MED ORDER — SUCRALFATE 1 GM/10ML PO SUSP: 1.0000 g | Freq: Three times a day (TID) | ORAL | 0 refills | Status: DC

## 2021-07-25 MED ORDER — LEVOFLOXACIN 500 MG PO TABS: 500.0000 mg | ORAL_TABLET | Freq: Every day | ORAL | 0 refills | Status: DC

## 2021-07-25 NOTE — Patient Instructions (Signed)
It was great to see you! Thank you for allowing me to participate in your care!   I recommend that you always bring your medications to each appointment as this makes it easy to ensure we are on the correct medications and helps [place] not miss when refills are needed.  Our plans for today:  -Please take Carafate 4 times daily to coat your stomach, please increase your Protonix 40 mg to twice daily.  I have also put in a referral for gastroenterology -We will try to get a sputum culture today and I have sent in another antibiotic, levofloxacin please take this once a day for 7 days -I have put in an order to look at the arteries in your heart to make sure you do not have a pulmonary embolism please get this as soon as possible -We are checking your lactic acid and some other labs as well -Please do not take any more steroids, Goody powders, do not drink alcohol  -If having worsening shortness of breath, more vomiting of blood, having fevers, chest pain, calf swelling or pain please go to the emergency department -We will follow-up after you are done with your new antibiotics or sooner as needed  We are checking some labs today, I will call you if they are abnormal will send you a MyChart message or a letter if they are normal.  If you do not hear about your labs in the next 2 weeks please let us know.\  Take care and seek immediate care sooner if you develop any concerns. Please remember to show up 15 minutes before your scheduled appointment time!  Gerrit Heck, MD [place]
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
## 2021-07-25 NOTE — Assessment & Plan Note (Signed)
2 episodes of hematemesis 1 on 12/28 and 1 on 1/2.  Hemoglobin was stable at 14.9 yesterday in ED. -Carafate and increased Protonix to twice daily -GI referral -Strict ED precautions discussed

## 2021-07-25 NOTE — Assessment & Plan Note (Signed)
Given continued wheezing and sputum production and not significant improvement clinically, wanted to include Pseudomonas coverage for patient's antibiotics.  No fevers currently, vitals all right except for tachycardic saturating 90% on room air and no obvious dyspnea.  Wheezing present throughout lung fields. -Levofloxacin 500 mg daily for 7 days -Sputum culture ordered -Lactic acid trending -BMP to monitor anion gap

## 2021-07-25 NOTE — Assessment & Plan Note (Addendum)
Includes pulmonary embolism given tachycardia hemoptysis and some shortness of breath.  No leg swelling or chest pain currently.  Patient also has had increased coughing due to COPD exacerbation, could be related to mucosal irritation.  Other possibilities are underlying malignancy given long smoking history and recent illness with prior CT with micro nodularity.  Denies any weight loss.  With alcohol use considered Mallory-Weiss/esophageal tear however vital signs stable currently and patient eating and drinking fine.  Patient also has a history of peptic ulcer disease. -Carafate 4 times daily, advised increase Protonix 40 mg to twice daily.  I have also put in a referral for gastroenterology -Well's score indicated moderate risk of PE with tachycardia, hemoptysis. No leg swelling.CTPE ordered -Lactic acid -Advised stopping steroids, Goody powders, alcohol, smoking -Strict ED precautions for shortness of breath, more vomiting of blood, having fevers, chest pain, calf swelling etc -F/u 1 week

## 2021-07-26 ENCOUNTER — Other Ambulatory Visit (HOSPITAL_COMMUNITY): Payer: Self-pay

## 2021-07-26 ENCOUNTER — Ambulatory Visit (HOSPITAL_COMMUNITY): Payer: Medicare Other

## 2021-07-26 LAB — LACTIC ACID, PLASMA: Lactate, Ven: 10 mg/dL (ref 4.8–25.7)

## 2021-07-26 NOTE — Progress Notes (Signed)
Initated a prior auth for pt's medication carafate (sucralfate) suspension through CoverMyMeds.  Key: [id number]

## 2021-07-27 ENCOUNTER — Other Ambulatory Visit (HOSPITAL_COMMUNITY): Payer: Self-pay

## 2021-07-27 ENCOUNTER — Telehealth: Payer: Self-pay | Admitting: Family Medicine

## 2021-07-27 NOTE — Progress Notes (Signed)
Prior Auth for patients medication sucralfate suspension approved by [employer] from 07/22/21 to 07/26/22.   Key: [place]  Patients pharmacy notified. Pt's copay is $4.15

## 2021-07-27 NOTE — Telephone Encounter (Signed)
Late entry:  Called patient yesterday evening around 7 PM as I noted he had not gone to get his CTA chest.  He states that he is feeling well, denies shortness of breath, notes he has been coughing less and there is no longer been blood or black discharge in his sputum.  He notes he has been able to get a sputum sample and has not been able to bring it up, he also states he has not been able to pick up the Levaquin yet.  His mother is currently on hospice and was very sick today which is why he had to cancel the CT scan.  He denies any chest pain, palpitations, syncope, blood in stools, melena, hematemesis, nausea, vomiting, abdominal pain.  He notes overall he is feeling "very well."  I discussed that without the CT scan we cannot rule out whether or not he has a blood clot in his lungs which could cause death or serious damage.  I also discussed the risk of him having a worsening infection and the need for the other antibiotic.  He voiced understanding and was aware of these risks, but states he needs to be with his mother right now.  We talked about strict return precautions including all of the above symptoms, which she was amenable to.  He did have a negative D-dimer and troponin in the ED which is reassuring and the fact that he is feeling better and the hemoptysis has stopped is also reassuring.  He had a mild anion gap of 15 but a normal lactate venous on his lab work.  Again discussed strict return precautions and he will call us back when he is able to take an appointment or reschedule CT.  Yehuda Savannah MD

## 2021-07-28 ENCOUNTER — Other Ambulatory Visit: Payer: Self-pay | Admitting: Student

## 2021-09-11 ENCOUNTER — Ambulatory Visit: Payer: Medicare Other

## 2021-09-11 NOTE — Progress Notes (Deleted)
° ° ° °  SUBJECTIVE:   CHIEF COMPLAINT / HPI:   Timothy [name] is a 56 y.o. male presents for ***  Primary symptom:*** Duration:*** Severity:*** Associated symptoms:*** Fever? Tmax?: *** Sick contacts:*** Covid test:*** Covid vaccination(s):***  ***  [facility] Visit from 07/25/2021 in [place]  PHQ-9 Total Score 7       Health Maintenance Due  Topic   COVID-19 Vaccine (1)   Hepatitis C Screening    TETANUS/TDAP    Zoster Vaccines- Shingrix (1 of 2)     PERTINENT  PMH / PSH: HTN, COPD, PUD   OBJECTIVE:   There were no vitals taken for this visit.   General: Alert, no acute distress Cardio: Normal S1 and S2, RRR, no r/m/g Pulm: CTAB, normal work of breathing Abdomen: Bowel sounds normal. Abdomen soft and non-tender.  Extremities: No peripheral edema.  Neuro: Cranial nerves grossly intact   ASSESSMENT/PLAN:   No problem-specific Assessment & Plan notes found for this encounter.    Lattie Haw, MD PGY-3 [place]

## 2021-10-17 ENCOUNTER — Ambulatory Visit (INDEPENDENT_AMBULATORY_CARE_PROVIDER_SITE_OTHER): Payer: Medicare Other | Admitting: Student

## 2021-10-17 ENCOUNTER — Encounter: Payer: Self-pay | Admitting: Student

## 2021-10-17 ENCOUNTER — Other Ambulatory Visit: Payer: Self-pay

## 2021-10-17 DIAGNOSIS — R222 Localized swelling, mass and lump, trunk: Secondary | ICD-10-CM | POA: Diagnosis present

## 2021-10-17 MED ORDER — DOXYCYCLINE HYCLATE 100 MG PO TABS: 100.0000 mg | ORAL_TABLET | Freq: Two times a day (BID) | ORAL | 0 refills | Status: AC

## 2021-10-17 MED ORDER — ONDANSETRON 8 MG PO TBDP: 8.0000 mg | ORAL_TABLET | Freq: Three times a day (TID) | ORAL | 0 refills | Status: DC | PRN

## 2021-10-17 NOTE — Progress Notes (Signed)
?SUBJECTIVE:  ? ?CHIEF COMPLAINT / HPI:  ? ?Patient with history going back to 2019 of subcutaneous abdominal wall mass that was concerning for hernia, but imaging confirmed it was phlegmon/cyst that responds well to abx (doxy 100 mg BID, 10 d). I/D 04/05/21 showed granula tissue extending from towards umbilicus, may be related to previous hernia surgery.  ? ?Everything started Saturday, it began to get sore. Hurts to cough, can't stand up or sit straight up, hurts when getting out of bed. Pain feels like a sharp stabbing pain in abdominal area. Spot is also TTP. Dosen't hurt testicals, or hurt to pee, or hurt for BM. Last BM and urination was today with no issues. Doesn't appear to be growing/moving. Appreciates some nausea, but no vomitting today, fever, chills, or myalgias. Want's refill for zofran ODT. ? ? ?Wetting self 1 a week.  ?Has to use bathroom, and when he finds out, he has to go then. No discomfort. Has minutes before he has to pee. Has been an issue going on 1-2 months.  ? ? ?PERTINENT  PMH / PSH: subcutaneous abdominal wall mass ? ? ?OBJECTIVE:  ?BP (!) 149/111   Pulse (!) 122   Wt 170 lb 9.6 oz (77.4 kg)   SpO2 100%   BMI 25.19 kg/m?  ? ?General: In mild pain/discomfort, pleasant, able to participate in exam ?Neuro: alert, no obvious focal deficits, speech normal ?Psych: Normal affect and mood ?Physical Exam ?Abdominal:  ?   General: A surgical scar is present. Bowel sounds are normal.  ?   Palpations: Abdomen is rigid. There is mass. There is no shifting dullness, fluid wave or pulsatile mass.  ?   Tenderness: There is abdominal tenderness in the right lower quadrant and suprapubic area. There is no guarding or rebound.  ?   Hernia: No hernia is present.  ?   Comments: Mass noted in pelvis/abdomen, superior to base of penis, inferior to umbilicus. Mass non-reducible. Mass noted to be nearly size of tennis ball  ? ? ? ? ? ? ?ASSESSMENT/PLAN:  ?Subcutaneous mass of abdominal wall ?Patient noted to  have recurrent episodes of mass in lower abdomen/upper pelvis. Mass is firm and nearly size of tennis ball, non reducible and TTP. Patient had I/D performed 04/05/21 that removed granulation tissues and stitch. Mass thought to be 2/2 reaction with mesh placed for hernia repair. Patient has been treated with Doxycycline 100 mg BID multiple times, and abx seem to help/improve it. Will treat with abx and recommend patient follow up with Surgery. Patient has appointment scheduled Friday 10/19/21 at 10:15. ?-Doxycyline 100 mg BID, x 5 days ?-Tylenol for pain control ?-Return precautions given ?  ?Urinary incontinence ?Patient having issue with urinary incontinence as often as 1 a week. Patient appreciates that at times he has to pee, and has seconds to get to a bathroom, before he has an accident. Patient continues to have sensation in lower extremities, with no issues with bowel movements. Less concerning for Cauda Equina syndrome. Patient recommended to return in 1 week to discuss issue. ?- consider UA, Ucx, prostate exam ? ?No orders of the defined types were placed in this encounter. ? ?Meds ordered this encounter  ?Medications  ? doxycycline (VIBRA-TABS) 100 MG tablet  ?  Sig: Take 1 tablet (100 mg total) by mouth 2 (two) times daily for 5 days.  ?  Dispense:  10 tablet  ?  Refill:  0  ? ondansetron (ZOFRAN-ODT) 8 MG disintegrating tablet  ?  Sig: Take 1 tablet (8 mg total) by mouth every 8 (eight) hours as needed for nausea or vomiting.  ?  Dispense:  15 tablet  ?  Refill:  0  ? ?No follow-ups on file. ?'@[facility]'$ @ ? ?

## 2021-10-17 NOTE — Assessment & Plan Note (Signed)
Patient noted to have recurrent episodes of mass in lower abdomen/upper pelvis. Mass is firm and nearly size of tennis ball, non reducible and TTP. Patient had I/D performed 04/05/21 that removed granulation tissues and stitch. Mass thought to be 2/2 reaction with mesh placed for hernia repair. Patient has been treated with Doxycycline 100 mg BID multiple times, and abx seem to help/improve it. Will treat with abx and recommend patient follow up with Surgery. Patient has appointment scheduled Friday 10/19/21 at 10:15. ?-Doxycyline 100 mg BID, x 5 days ?-Tylenol for pain control ?-Return precautions given ?

## 2021-10-17 NOTE — Patient Instructions (Addendum)
It was great to see you! Thank you for allowing me to participate in your care! ? ?It looks like you are having a recurrence of this mass, related to your mesh placement for a hernia repair. This will best be managed with surgery. Please follow up with your Surgeon, to determine best plans going forward/what to do if this come's back.  ? ?Our plans for today:  ?- Follow up with Surgery ? [facility] ? [phone] ?- Doxycycline 100 mg BID x 5 days ?-Tylenol for pain ?- Seek medical care if: ? Develop fever (100.4 F or higher) ? Develop increased/unbearable abdominal pain ? Develop constipation/difficulties urinating.  ? Mass begins to bulge/grow ? ? ? ?Take care and seek immediate care sooner if you develop any concerns.  ? ?Dr. Holley Bouche, MD ?[place] ? ?

## 2021-10-23 ENCOUNTER — Ambulatory Visit: Payer: Medicare Other

## 2021-11-02 ENCOUNTER — Ambulatory Visit: Payer: Medicare Other | Admitting: Student

## 2021-11-12 ENCOUNTER — Other Ambulatory Visit: Payer: Self-pay | Admitting: Student

## 2021-11-12 DIAGNOSIS — F339 Major depressive disorder, recurrent, unspecified: Secondary | ICD-10-CM

## 2021-11-16 ENCOUNTER — Ambulatory Visit: Payer: Self-pay | Admitting: General Surgery

## 2021-11-16 ENCOUNTER — Other Ambulatory Visit: Payer: Self-pay | Admitting: Student

## 2021-11-21 ENCOUNTER — Other Ambulatory Visit: Payer: Self-pay | Admitting: General Surgery

## 2021-11-21 DIAGNOSIS — L02211 Cutaneous abscess of abdominal wall: Secondary | ICD-10-CM

## 2021-12-14 ENCOUNTER — Inpatient Hospital Stay: Admission: RE | Admit: 2021-12-14 | Payer: Medicare Other | Source: Ambulatory Visit

## 2021-12-18 ENCOUNTER — Other Ambulatory Visit: Payer: Self-pay | Admitting: Student

## 2021-12-18 DIAGNOSIS — G894 Chronic pain syndrome: Secondary | ICD-10-CM

## 2021-12-20 ENCOUNTER — Inpatient Hospital Stay: Admission: RE | Admit: 2021-12-20 | Payer: Medicare Other | Source: Ambulatory Visit

## 2021-12-21 ENCOUNTER — Ambulatory Visit
Admission: RE | Admit: 2021-12-21 | Discharge: 2021-12-21 | Disposition: A | Discharge status: Home / Self Care | Payer: Medicare Other | Source: Ambulatory Visit | Attending: General Surgery | Admitting: General Surgery

## 2021-12-21 ENCOUNTER — Encounter (HOSPITAL_COMMUNITY): Payer: Self-pay | Admitting: General Surgery

## 2021-12-21 ENCOUNTER — Other Ambulatory Visit: Payer: Self-pay

## 2021-12-21 DIAGNOSIS — L02211 Cutaneous abscess of abdominal wall: Secondary | ICD-10-CM

## 2021-12-21 MED ORDER — IOPAMIDOL (ISOVUE-300) INJECTION 61%
100.0000 mL | Freq: Once | INTRAVENOUS | Status: AC | PRN
  Administered 2021-12-21: 100 mL via INTRAVENOUS

## 2021-12-21 NOTE — Progress Notes (Signed)
Timothy [name] denies chest pain or shortness of breath. Patient denies having any s/s of Covid in his household.  Patient denies any known exposure to Covid.   PCP is Dr.Maguri Jinny Sanders with [facility].  Timothy [name] has a history of COPD, he states he is doing well  with it at this time.. Timothy [name] smokes 2 packs of cigarettes a day. I encouraged patient to try to take deep breathes and hold it as long as he can to decrease the amount of cigarettets he smokes.  I instructed Timothy [name] to not smoke anything 24 hours prior to surgery.

## 2021-12-24 ENCOUNTER — Ambulatory Visit (HOSPITAL_COMMUNITY): Admission: RE | Admit: 2021-12-24 | Payer: Medicare Other | Source: Home / Self Care | Admitting: General Surgery

## 2021-12-24 ENCOUNTER — Other Ambulatory Visit (HOSPITAL_COMMUNITY): Payer: Self-pay | Admitting: General Surgery

## 2021-12-24 ENCOUNTER — Other Ambulatory Visit: Payer: Self-pay | Admitting: General Surgery

## 2021-12-24 DIAGNOSIS — L02211 Cutaneous abscess of abdominal wall: Secondary | ICD-10-CM

## 2021-12-24 HISTORY — DX: Other complications of anesthesia, initial encounter: T88.59XA

## 2021-12-24 SURGERY — INCISION AND DRAINAGE, ABSCESS
Anesthesia: General

## 2021-12-24 NOTE — Progress Notes (Signed)
Dr. Marlou Starks called to please contact the Timothy [name] and have him not show-up for surgery, but to call the office of [facility] [phone] for further instructions.

## 2021-12-24 NOTE — H&P (Signed)
I reviewed his CT scan from Friday and the patient has a small chronic fluid collection in the abdominal wall.  I feel this would be best managed at this point with a percutaneous drain.  We will contact interventional radiology to have this done.  I will cancel his surgery for today.  I have discussed this with him and he is in agreement.

## 2021-12-24 NOTE — Progress Notes (Unsigned)
Arne Cleveland, MD  [facility]   CT aspiration ant abd wall midline chronic collection + sedation  Drain dry with 18g or Teressa Lower  Send for culture  Prob defer drain placement unless  obviously infected  Discussed with Marlou Starks

## 2021-12-24 NOTE — Progress Notes (Addendum)
Pt called, and he was made aware of the new plan from Dr. Marlou Starks. Pt given the number to the office to call as soon as they open, and not to show up for surgery.

## 2021-12-25 ENCOUNTER — Encounter: Payer: Self-pay | Admitting: *Deleted

## 2021-12-28 ENCOUNTER — Other Ambulatory Visit: Payer: Self-pay | Admitting: Student

## 2021-12-28 ENCOUNTER — Other Ambulatory Visit: Payer: Self-pay | Admitting: Radiology

## 2021-12-31 ENCOUNTER — Encounter (HOSPITAL_COMMUNITY): Payer: Self-pay

## 2021-12-31 ENCOUNTER — Other Ambulatory Visit: Payer: Self-pay

## 2021-12-31 ENCOUNTER — Ambulatory Visit (HOSPITAL_COMMUNITY)
Admission: RE | Admit: 2021-12-31 | Discharge: 2021-12-31 | Disposition: A | Discharge status: Home / Self Care | Payer: Medicare Other | Source: Ambulatory Visit | Attending: General Surgery | Admitting: General Surgery

## 2021-12-31 DIAGNOSIS — K922 Gastrointestinal hemorrhage, unspecified: Secondary | ICD-10-CM | POA: Diagnosis not present

## 2021-12-31 DIAGNOSIS — L02211 Cutaneous abscess of abdominal wall: Secondary | ICD-10-CM | POA: Insufficient documentation

## 2021-12-31 DIAGNOSIS — G8929 Other chronic pain: Secondary | ICD-10-CM | POA: Diagnosis not present

## 2021-12-31 DIAGNOSIS — F319 Bipolar disorder, unspecified: Secondary | ICD-10-CM | POA: Diagnosis not present

## 2021-12-31 DIAGNOSIS — I10 Essential (primary) hypertension: Secondary | ICD-10-CM | POA: Insufficient documentation

## 2021-12-31 DIAGNOSIS — I700 Atherosclerosis of aorta: Secondary | ICD-10-CM | POA: Diagnosis not present

## 2021-12-31 DIAGNOSIS — K219 Gastro-esophageal reflux disease without esophagitis: Secondary | ICD-10-CM | POA: Diagnosis not present

## 2021-12-31 DIAGNOSIS — F419 Anxiety disorder, unspecified: Secondary | ICD-10-CM | POA: Insufficient documentation

## 2021-12-31 DIAGNOSIS — F17200 Nicotine dependence, unspecified, uncomplicated: Secondary | ICD-10-CM | POA: Diagnosis not present

## 2021-12-31 DIAGNOSIS — J439 Emphysema, unspecified: Secondary | ICD-10-CM | POA: Diagnosis not present

## 2021-12-31 DIAGNOSIS — F1010 Alcohol abuse, uncomplicated: Secondary | ICD-10-CM | POA: Insufficient documentation

## 2021-12-31 DIAGNOSIS — R0600 Dyspnea, unspecified: Secondary | ICD-10-CM | POA: Insufficient documentation

## 2021-12-31 LAB — BASIC METABOLIC PANEL
Anion gap: 10 (ref 5–15)
BUN: 16 mg/dL (ref 6–20)
CO2: 20 mmol/L — ABNORMAL LOW (ref 22–32)
Calcium: 9.5 mg/dL (ref 8.9–10.3)
Chloride: 109 mmol/L (ref 98–111)
Creatinine, Ser: 0.85 mg/dL (ref 0.61–1.24)
GFR, Estimated: >60 mL/min (ref >60)
Glucose, Bld: 124 mg/dL — ABNORMAL HIGH (ref 70–99)
Potassium: 4.2 mmol/L (ref 3.5–5.1)
Sodium: 139 mmol/L (ref 135–145)

## 2021-12-31 LAB — CBC
HCT: 43.3 % (ref 39.0–52.0)
Hemoglobin: 14 g/dL (ref 13.0–17.0)
MCH: 32.2 pg (ref 26.0–34.0)
MCHC: 32.3 g/dL (ref 30.0–36.0)
MCV: 99.5 fL (ref 80.0–100.0)
Platelets: 368 10*3/uL (ref 150–400)
RBC: 4.35 MIL/uL (ref 4.22–5.81)
RDW: 14.5 % (ref 11.5–15.5)
WBC: 7 10*3/uL (ref 4.0–10.5)
nRBC: 0 % (ref 0.0–0.2)

## 2021-12-31 LAB — PROTIME-INR
INR: 0.9 (ref 0.8–1.2)
Prothrombin Time: 12.4 seconds (ref 11.4–15.2)

## 2021-12-31 MED ORDER — MIDAZOLAM HCL 2 MG/2ML IJ SOLN
INTRAMUSCULAR | Status: DC | PRN
  Administered 2021-12-31: 1 mg via INTRAVENOUS
  Administered 2021-12-31 (×2): .5 mg via INTRAVENOUS

## 2021-12-31 MED ORDER — MIDAZOLAM HCL 2 MG/2ML IJ SOLN
INTRAMUSCULAR | Status: AC
  Filled 2021-12-31: qty 4

## 2021-12-31 MED ORDER — LIDOCAINE HCL 1 % IJ SOLN
INTRAMUSCULAR | Status: AC
  Filled 2021-12-31: qty 10

## 2021-12-31 MED ORDER — FENTANYL CITRATE (PF) 100 MCG/2ML IJ SOLN
INTRAMUSCULAR | Status: AC
  Filled 2021-12-31: qty 4

## 2021-12-31 MED ORDER — FENTANYL CITRATE (PF) 100 MCG/2ML IJ SOLN
INTRAMUSCULAR | Status: DC | PRN
  Administered 2021-12-31 (×2): 50 ug via INTRAVENOUS

## 2021-12-31 MED ORDER — SODIUM CHLORIDE 0.9 % IV SOLN: INTRAVENOUS | Status: DC

## 2021-12-31 NOTE — Procedures (Signed)
Interventional Radiology Procedure Note  Procedure: CT guided abdominal fluid collection aspiration  Findings: Please refer to procedural dictation for full description. 5 mL serosanguinous yield.  Samples sent for culture  Complications: None immediate  Estimated Blood Loss:  < 5 mL  Recommendations: Follow up cultures.   Ruthann Cancer, MD

## 2021-12-31 NOTE — H&P (Signed)
Chief Complaint: Patient was seen in consultation today for chronic abdominal wall fluid collection at the request of Toth,Paul III  Referring Physician(s): Toth,Paul III  Supervising Physician: Ruthann Cancer  Patient Status: [facility] - Out-pt  History of Present Illness: Timothy [name] is a 56 y.o. male with PMH of EtOH abuse, anxiety, bipolar disorder, chronic pain, DOE, emphysema, GERD, HTN, lower GI bleed and tobacco abuse.  Patient approximately 8 years status post ventral hernia repair with mesh.  He is 5 years status post excision of stitch and unincorporated mesh from lower abdominal wall.  Patient has had recurrent episodes of small abscesses along the lower abdominal wall that has responded to doxycycline.  Approximately 9 months ago patient presented to surgery with complaint of abdominal swelling.  CT scan at that time showed a small fluid collection in the lower abdominal wall.  Patient underwent I&D of abdominal wall abscess 04/05/2021 with Dr. Autumn Messing.  Due to reoccurring symptoms patient had CT scan 12/21/2021 that showed chronic anterior peritoneal cavity fluid collection not substantially changed since February 20, 2021 compatible with chronic abscess.  Patient was referred to IR by Dr. Autumn Messing for abdominal wall aspiration/drain placement to manage recurrent fluid in this location.  CT abdomen pelvis 12/21/2021: IMPRESSION: 1. Chronic anterior peritoneal cavity 4.9 x 1.1 x 4.6 cm fluid collection with thick enhancing wall, not substantially changed since 02/20/2021 CT, intimately associated with the anterior superior bladder wall as on the prior CT. 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 2. Chronic curvilinear soft tissue tract in the ventral midline subcutaneous pelvic wall, without evidence of recurrent drainable fluid collection in this location. 3. No evidence of bowel obstruction or acute bowel inflammation. Moderate diffuse colonic diverticulosis, with no  evidence of acute diverticulitis. 4. Chronic bilateral L5 pars defects. 5. Aortic Atherosclerosis (ICD10-I70.0).    Past Medical History:  Diagnosis Date   Acute bronchitis 02/15/2013   Alcohol abuse    Anxiety    Bipolar disorder ([place])    Bronchitis    history   Bronchitis    Cellulitis    - left knee-2009   Chronic pain    Complication of anesthesia    Condyloma 02/04/2012   Depression    Diverticulosis    By colonoscopy June 2005   Dyspnea    on exertion at times   Elevated CK 09/2011   Emphysema lung ([place])    Emphysema of lung ([place])    Family history of anesthesia complication    Mother N/V   GERD (gastroesophageal reflux disease)    Glaucoma syndrome    does not use eye drops, "They burn".   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 Presumably secondary to diverticulosis.   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   Ventral hernia 01/11/2013  Repaired with mesh 05/20/13   Wrist pain 01/26/2019    Past Surgical History:  Procedure Laterality Date   ABDOMINAL EXPLORATION SURGERY  07/23/2012   w/LOA (07/23/2012)   [place] RESECTION  02/27/2012   Procedure: COLON RESECTION;  Surgeon: Merrie Roof, MD;  Location: [place];  Service: General;  Laterality: N/A;   COLOSTOMY  02/27/2012   Procedure: COLOSTOMY;  Surgeon: Merrie Roof, MD;  Location: [place];  Service: General;  Laterality: N/A;   COLOSTOMY TAKEDOWN  07/23/2012   COLOSTOMY TAKEDOWN  07/23/2012   Procedure: COLOSTOMY TAKEDOWN;  Surgeon: Merrie Roof, MD;  Location: [place];  Service: General;  Laterality: N/A;  Primary Anastomosis   ELBOW FRACTURE SURGERY  ~ 1975   left;  pins inserted    INCISION AND DRAINAGE ABSCESS N/A 04/05/2021   Procedure: INCISION AND DRAINAGE ABDOMINAL WALL ABSCESS;  Surgeon: Jovita Kussmaul, MD;  Location: [place];  Service: General;  Laterality: N/A;   INSERTION OF MESH N/A 05/20/2013   Procedure: INSERTION OF MESH;  Surgeon: Merrie Roof, MD;  Location: [place];  Service: General;  Laterality: N/A;   LAPAROSCOPIC LEFT COLON RESECTION  02/19/2012   SIGMOID   LAPAROTOMY  02/27/2012   Procedure: EXPLORATORY LAPAROTOMY;  Surgeon: Merrie Roof, MD;  Location: [place];  Service: General;  Laterality: N/A;   LAPAROTOMY  07/23/2012   Procedure: EXPLORATORY LAPAROTOMY;  Surgeon: Merrie Roof, MD;  Location: [place];  Service: General;  Laterality: N/A;   LESION DESTRUCTION  07/23/2012   Procedure: DESTRUCTION LESION ANUS;  Surgeon: Merrie Roof, MD;  Location: [place];  Service: General;  Laterality: N/A;  DESTRUCTION ANAL CONDYLOMA   LYSIS OF ADHESION  07/23/2012   Procedure: LYSIS OF ADHESION;  Surgeon: Merrie Roof, MD;  Location: [place];  Service: General;  Laterality: N/A;   VENTRAL HERNIA REPAIR  05/20/2013   Dr Marlon Pel HERNIA REPAIR N/A 05/20/2013   Procedure: [place] ;  Surgeon: Merrie Roof, MD;  Location: [place];  Service: General;  Laterality: N/A;   WART FULGURATION  02/19/2012   Procedure: FULGURATION ANAL WART;  Surgeon: Merrie Roof, MD;  Location: [place];  Service: General;  Laterality: N/A;  Destroy  Anal Condyloma    WISDOM TOOTH EXTRACTION  ~ [place] N/A 06/03/2016   Procedure: ABDOMINAL WOUND EXPLORATION AND STITCH REMOVAL;  Surgeon: [name], MD;  Location: [place];  Service: General;  Laterality: N/A;  ABDOMINAL WOUND EXPLORATION AND STITCH REMOVAL    Allergies: Ambien [zolpidem tartrate], Bactroban [mupirocin calcium], Darvocet [propoxyphene n-acetaminophen], Zestril [lisinopril], and Morphine and related  Medications: Prior to Admission medications   Medication Sig Start Date End Date Taking? Authorizing Provider  albuterol (PROVENTIL) (2.5 MG/3ML) 0.083% nebulizer solution Take 3 mLs (2.5 mg total) by nebulization every 6 (six) hours as needed for wheezing or shortness of breath. 05/19/20   Matilde Haymaker, MD  albuterol (VENTOLIN HFA) 108 (90 Base) MCG/ACT inhaler INHALE 2 PUFFS INTO THE LUNGS EVERY 6 HOURS AS NEEDED FOR WHEEZING OR SHORTNESS OF BREATH 05/08/21   Gerrit Heck, MD  Aspirin-Acetaminophen-Caffeine (GOODYS EXTRA STRENGTH) 520-260-32.5 MG PACK Take 2 packets by mouth 3 (three) times daily as needed (headaches).    [provider]  azithromycin (ZITHROMAX) 250 MG tablet Take 2 tabs day 1, then 1  tab daily Patient not taking: Reported on 12/20/2021 07/19/21   McDiarmid, Blane Ohara, MD  busPIRone (BUSPAR) 15 MG tablet Take 1 tablet (15 mg total) by mouth at bedtime. Patient taking differently: Take 15 mg by mouth in the morning. 05/08/21   Gerrit Heck, MD  cyclobenzaprine (FLEXERIL) 10 MG tablet TAKE 1 TABLET BY MOUTH DAILY AS NEEDED FOR MUSCLE SPASM. NEED OFFICE VISIT FOR REFILLS 11/18/21   Gerrit Heck, MD  gabapentin (NEURONTIN) 100 MG capsule TAKE 1 CAPSULE BY MOUTH TWICE DAILY 12/20/21   Gerrit Heck, MD  levofloxacin (LEVAQUIN) 500 MG tablet Take 1 tablet (500 mg total) by mouth daily. Patient not taking: Reported on 12/20/2021 07/25/21   Gerrit Heck, MD  losartan (COZAAR) 100 MG tablet TAKE 1 TABLET BY MOUTH EVERY DAY 07/10/21   Gerrit Heck, MD  ondansetron (ZOFRAN-ODT) 8 MG disintegrating tablet Take 1 tablet (8 mg total) by mouth every 8 (eight) hours as needed for nausea or vomiting. 10/17/21    Holley Bouche, MD  oxyCODONE (OXY IR/ROXICODONE) 5 MG immediate release tablet Take 5 mg by mouth every 6 (six) hours as needed for pain. 12/10/21   [provider]  pantoprazole (PROTONIX) 40 MG tablet Take 1 tablet (40 mg total) by mouth daily. 02/20/21   Gerrit Heck, MD  sertraline (ZOLOFT) 100 MG tablet TAKE 1 TABLET (100 MG TOTAL) BY MOUTH DAILY. 11/12/21   Gerrit Heck, MD  sucralfate (CARAFATE) 1 GM/10ML suspension Take 10 mLs (1 g total) by mouth 4 (four) times daily -  with meals and at bedtime. Patient not taking: Reported on 12/20/2021 07/25/21   Gerrit Heck, MD  lurasidone (LATUDA) 40 MG TABS tablet TAKE 1 TABLET(40 MG) BY MOUTH DAILY WITH BREAKFAST 05/12/19 05/08/20  Nuala Alpha, MD  mometasone-formoterol (DULERA) 100-5 MCG/ACT AERO Inhale 2 puffs into the lungs 2 (two) times daily. 02/22/19 05/19/20  Nuala Alpha, MD     Family History  Problem Relation Age of Onset   Diabetes Mother    Parkinsonism Mother    Depression Mother    Alcohol abuse Father    Hypertension Father    Anxiety disorder Father    Ulcers Father    Colon polyps Father    Breast cancer Maternal Aunt    Diabetes Maternal Grandmother    Stroke Neg Hx    Heart disease Neg Hx    Colon cancer Neg Hx    Stomach cancer Neg Hx     Social History   Socioeconomic History   Marital status: Legally Separated    Spouse name: Not on file   Number of children: 3   Years of education: Not on file   Highest education level: Not on file  Occupational History   Occupation: unemployed  Tobacco Use   Smoking status: Every Day    Packs/day: 2.00    Years: 34.00    Total pack years: 68.00    Types: Cigarettes    Start date: 07/22/1981   Smokeless tobacco: Former    Types: Chew    Quit date: 12/12/2010   Tobacco comments:    Current 2 PPD smoker - Engineer, 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  Vaping Use   Vaping Use: Never used  Substance and Sexual Activity   Alcohol use: Yes    Alcohol/week: 36.0 standard drinks  of alcohol    Types: 36 Cans of beer per week    Comment: drinks every other day - beer 2-3 per day   Drug use: Yes    Types: Oxycodone,  Marijuana    Comment: 3 times a month   Sexual activity: Not on file  Other Topics Concern   Not on file  Social History Narrative   Lives with wife Radford, Children Kolbey (age 46) Sterling Big (age 42) [name] (age 74).  Unemployed less than high school education.  Updated 10/29/11   Social Determinants of Health   Financial Resource Strain: Not on file  Food Insecurity: Not on file  Transportation Needs: Not on file  Physical Activity: Not on file  Stress: Not on file  Social Connections: Not on file    Review of Systems: A 12 point ROS discussed and pertinent positives are indicated in the HPI above.  All other systems are negative.  Review of Systems  Constitutional:  Positive for fatigue. Negative for chills and fever.  Respiratory:  Positive for shortness of breath. Negative for cough.   Cardiovascular:  Negative for chest pain and leg swelling.  Gastrointestinal:  Positive for abdominal pain. Negative for nausea and vomiting.  Neurological:  Negative for dizziness, weakness and headaches.  Hematological:  Bruises/bleeds easily.    Vital Signs: BP (!) 150/95   Pulse (!) 114   Temp 98.2 F (36.8 C) (Oral)   Resp 18   Ht '5\' 9"'$  (1.753 m)   Wt 173 lb (78.5 kg)   SpO2 95%   BMI 25.55 kg/m   Physical Exam Constitutional:      General: He is not in acute distress.    Appearance: Normal appearance. He is not ill-appearing.  HENT:     Head: Normocephalic and atraumatic.     Mouth/Throat:     Mouth: Mucous membranes are moist.     Pharynx: Oropharynx is clear.  Eyes:     Extraocular Movements: Extraocular movements intact.     Pupils: Pupils are equal, round, and reactive to light.  Cardiovascular:     Rate and Rhythm: Regular rhythm. Tachycardia present.     Pulses: Normal pulses.     Heart sounds: Normal heart sounds.  Pulmonary:      Effort: Pulmonary effort is normal. No respiratory distress.     Breath sounds: Wheezing present.     Comments: RLL Abdominal:     General: Bowel sounds are normal. There is no distension.     Palpations: Abdomen is soft.     Tenderness: There is no abdominal tenderness. There is no guarding.  Musculoskeletal:     Right lower leg: No edema.     Left lower leg: No edema.  Skin:    General: Skin is warm and dry.  Neurological:     Mental Status: He is alert and oriented to person, place, and time.  Psychiatric:        Mood and Affect: Mood normal.        Behavior: Behavior normal.        Thought Content: Thought content normal.        Judgment: Judgment normal.     Imaging: CT ABDOMEN PELVIS W CONTRAST  Result Date: 12/21/2021 CLINICAL DATA:  Recurrent abdominal wall abscess. Prior ventral abdominal hernia repair, appendectomy and colonic resection. EXAM: CT ABDOMEN AND PELVIS WITH CONTRAST TECHNIQUE: Multidetector CT imaging of the abdomen and pelvis was performed using the standard protocol following bolus administration of intravenous contrast. RADIATION DOSE REDUCTION: This exam was performed according to the departmental dose-optimization program which includes automated exposure control, adjustment of the mA and/or kV according to patient size and/or use of iterative reconstruction technique. CONTRAST:  174m ISOVUE-300 IOPAMIDOL (ISOVUE-300) INJECTION 61% COMPARISON:  02/20/2021 CT abdomen/pelvis. FINDINGS: Lower chest: No significant pulmonary nodules or acute consolidative airspace disease. Hepatobiliary: Normal liver size. No liver mass. Normal gallbladder with no radiopaque cholelithiasis. No biliary ductal dilatation. Pancreas: Normal, with no mass or duct dilation. Spleen: Normal size. No mass. Adrenals/Urinary Tract: Stable adrenal glands without discrete adrenal nodules. Normal kidneys with no hydronephrosis and no renal mass. Bladder is decompressed and within normal  limits. Stomach/Bowel: Normal non-distended stomach. Normal caliber small bowel with no small bowel wall thickening. Appendectomy. Scattered moderate diffuse colonic diverticulosis, most prominent in the left colon, with no large bowel wall thickening or significant pericolonic fat stranding. Vascular/Lymphatic: Atherosclerotic nonaneurysmal abdominal aorta. Patent portal, splenic, hepatic and renal veins. No pathologically enlarged lymph nodes in the abdomen or pelvis. Reproductive: Normal size prostate. Other: No pneumoperitoneum. Anterior peritoneal cavity 4.9 x 1.1 x 4.6 cm fluid collection with thick enhancing wall (series 2/image 52), previously 5.9 x 1.2 x 4.8 cm on 02/20/2021 CT, not substantially changed, intimately associated with the anterior superior bladder wall as on the prior CT. Chronic curvilinear soft tissue tract in the ventral midline subcutaneous pelvic wall (series 8/image 104), without evidence of recurrent drainable fluid collection in this location. No evidence of a recurrent ventral abdominal hernia. Musculoskeletal: No aggressive appearing focal osseous lesions. Mild thoracolumbar spondylosis. Chronic bilateral L5 pars defects. IMPRESSION: 1. Chronic anterior peritoneal cavity 4.9 x 1.1 x 4.6 cm fluid collection with thick enhancing wall, not substantially changed since 02/20/2021 CT, intimately associated with the anterior superior bladder wall as on the prior CT. 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 2. Chronic curvilinear soft tissue tract in the ventral midline subcutaneous pelvic wall, without evidence of recurrent drainable fluid collection in this location. 3. No evidence of bowel obstruction or acute bowel inflammation. Moderate diffuse colonic diverticulosis, with no evidence of acute diverticulitis. 4. Chronic bilateral L5 pars defects. 5. Aortic Atherosclerosis (ICD10-I70.0). Electronically Signed   By: JIlona SorrelM.D.   On: 12/21/2021 11:44     Labs:  CBC: Recent Labs    04/05/21 0745 07/24/21 0159 12/31/21 0823  WBC 3.8* 12.8* 7.0  HGB 10.3* 14.1 14.0  HCT 31.7* 43.2 43.3  PLT 231 536* 368    COAGS: Recent Labs    07/24/21 0159 12/31/21 0823  INR 0.9 0.9    BMP: Recent Labs    04/05/21 0745 07/24/21 0159 07/25/21 1253 12/31/21 0823  NA 141 137 137 139  K 4.0 3.9 4.9 4.2  CL 103 101 99 109  CO2 21* 20* 23 20*  GLUCOSE 113* 111* 116* 124*  BUN 15 11 25* 16  CALCIUM 9.4 9.7 10.5* 9.5  CREATININE 0.84 0.74 0.87 0.85  GFRNONAA >60 >60  --  >60    LIVER FUNCTION TESTS: Recent Labs    07/24/21 0159  BILITOT 0.6  AST 21  ALT 21  ALKPHOS 76  PROT 8.0  ALBUMIN 4.2    TUMOR MARKERS: No results for input(s): "AFPTM", "CEA", "CA199", "CHROMGRNA" in the last 8760 hours.  Assessment and Plan: History of EtOH abuse, anxiety, bipolar disorder, chronic pain, DOE, emphysema, GERD, HTN, lower GI bleed and tobacco abuse.  Patient approximately 8 years status post ventral hernia repair with mesh.  He is 5 years status post excision of stitch and unincorporated mesh from lower abdominal wall.  Patient has had recurrent episodes of small abscesses along the lower abdominal wall that has responded to doxycycline.  Approximately 9  months ago patient presented to surgery with complaint of abdominal swelling.  CT scan at that time showed a small fluid collection in the lower abdominal wall.  Patient underwent I&D of abdominal wall abscess 04/05/2021 with Dr. Autumn Messing.  Due to reoccurring symptoms patient had CT scan 12/21/2021 that showed chronic anterior peritoneal cavity fluid collection not substantially changed since February 20, 2021 compatible with chronic abscess.  Patient was referred to IR by Dr. Autumn Messing for abdominal wall aspiration/drain placement to manage recurrent fluid in this location.  CT abdomen pelvis 12/21/2021: IMPRESSION: 1. Chronic anterior peritoneal cavity 4.9 x 1.1 x 4.6 cm fluid collection with  thick enhancing wall, not substantially changed since 02/20/2021 CT, intimately associated with the anterior superior bladder wall as on the prior CT. 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 2. Chronic curvilinear soft tissue tract in the ventral midline subcutaneous pelvic wall, without evidence of recurrent drainable fluid collection in this location. 3. No evidence of bowel obstruction or acute bowel inflammation. Moderate diffuse colonic diverticulosis, with no evidence of acute diverticulitis. 4. Chronic bilateral L5 pars defects. 5. Aortic Atherosclerosis (ICD10-I70.0).  Pt sitting upright on stretcher. He is A&O, calm and pleasant. He is in no distress.  Dr. Serafina Royals at bedside discussing procedure with patient.  Pt is NPO per order.    Risks and benefits of abdominal fluid collection aspiration/drain placement with moderate sedation discussed with the patient including bleeding, infection, damage to adjacent structures, bowel perforation/fistula connection, and sepsis.  All of the patient's questions were answered, patient is agreeable to proceed. Consent signed and in chart.   Thank you for this interesting consult.  I greatly enjoyed meeting Timothy [name] and look forward to participating in their care.  A copy of this report was sent to the requesting provider on this date.  Electronically Signed: Tyson Alias, NP 12/31/2021, 9:15 AM   I spent a total of 20 minutes in face to face in clinical consultation, greater than 50% of which was counseling/coordinating care for chronic abdominal wall fluid collection.

## 2022-01-01 ENCOUNTER — Other Ambulatory Visit: Payer: Self-pay | Admitting: Student

## 2022-01-05 LAB — AEROBIC/ANAEROBIC CULTURE W GRAM STAIN (SURGICAL/DEEP WOUND): Culture: NO GROWTH

## 2022-01-30 ENCOUNTER — Encounter: Payer: Self-pay | Admitting: Gastroenterology

## 2022-02-01 ENCOUNTER — Other Ambulatory Visit: Payer: Self-pay | Admitting: Student

## 2022-02-19 ENCOUNTER — Other Ambulatory Visit: Payer: Self-pay | Admitting: Student

## 2022-02-19 DIAGNOSIS — R112 Nausea with vomiting, unspecified: Secondary | ICD-10-CM

## 2022-02-19 DIAGNOSIS — K219 Gastro-esophageal reflux disease without esophagitis: Secondary | ICD-10-CM

## 2022-02-19 DIAGNOSIS — R1084 Generalized abdominal pain: Secondary | ICD-10-CM

## 2022-02-25 ENCOUNTER — Other Ambulatory Visit: Payer: Self-pay | Admitting: Student

## 2022-02-25 DIAGNOSIS — F410 Panic disorder [episodic paroxysmal anxiety] without agoraphobia: Secondary | ICD-10-CM

## 2022-02-25 DIAGNOSIS — J441 Chronic obstructive pulmonary disease with (acute) exacerbation: Secondary | ICD-10-CM

## 2022-02-25 DIAGNOSIS — R0602 Shortness of breath: Secondary | ICD-10-CM

## 2022-02-28 ENCOUNTER — Ambulatory Visit: Payer: Medicare Other | Admitting: Family Medicine

## 2022-02-28 ENCOUNTER — Ambulatory Visit: Payer: Medicare Other | Admitting: Internal Medicine

## 2022-03-01 ENCOUNTER — Encounter: Payer: Self-pay | Admitting: Interventional Cardiology

## 2022-03-01 ENCOUNTER — Ambulatory Visit (INDEPENDENT_AMBULATORY_CARE_PROVIDER_SITE_OTHER): Payer: Medicare Other | Admitting: Interventional Cardiology

## 2022-03-01 ENCOUNTER — Ambulatory Visit: Payer: Medicare Other

## 2022-03-01 VITALS — BP 114/76 | HR 113 | Ht 69.0 in | Wt 157.6 lb

## 2022-03-01 DIAGNOSIS — R634 Abnormal weight loss: Secondary | ICD-10-CM

## 2022-03-01 DIAGNOSIS — Z01810 Encounter for preprocedural cardiovascular examination: Secondary | ICD-10-CM | POA: Diagnosis not present

## 2022-03-01 DIAGNOSIS — R0609 Other forms of dyspnea: Secondary | ICD-10-CM | POA: Diagnosis not present

## 2022-03-01 DIAGNOSIS — J449 Chronic obstructive pulmonary disease, unspecified: Secondary | ICD-10-CM

## 2022-03-01 NOTE — Progress Notes (Signed)
Cardiology Office Note   Date:  03/01/2022   ID:  Timothy [name], DOB 1965-11-21, MRN [id number]  PCP:  Timothy Heck, MD    No chief complaint on file.  Preoperative cardiovascular exam  Wt Readings from Last 3 Encounters:  03/01/22 157 lb 9.6 oz (71.5 kg)  12/31/21 173 lb (78.5 kg)  10/17/21 170 lb 9.6 oz (77.4 kg)       History of Present Illness: Timothy [name] is a 56 y.o. male who is being seen today for the evaluation of preoperative cardiovascular exam at the request of Timothy Kussmaul, MD.  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 He has a remote history of ventral hernia repair with mesh.Marland KitchenMarland KitchenMarland KitchenThe patient appears to have a chronic abscess of the abdominal wall remote from a ventral hernia repair with mesh. 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 He will need an exploratory laparotomy with removal of some or all of the mesh in order to try to prevent this from continuing to recur. I have discussed with him in detail the risks and benefits of the operation as well as some of the technical aspects and he understands and wishes to proceed. We will send him for a cardiac evaluation prior to surgery to make sure he can tolerate another big open abdominal operation. "  Surgeries have been in June 2014 followed by a few more surgeries. The last time he had general anesthesia, Nov 2022.    She has some anxiety to have surgery.    He has chronic shortness of breath.  Has fatigue.  Known COPD.  Sleep has been disturbed at times. Feels that he is "chokong and coughing" in his sleep.  He works on his cars.  He can work outside for 3-4 hours and then feels tired. He has to do some lifting and training.  He goes shopping without any problems.  No chest pain.  [place] has [place] long walk to put  out the garbage can to bring it back in.  No cardiac symptoms when doing that walk.  Denies : Chest pain.  Dizziness. Leg edema. Nitroglycerin use. Orthopnea. Palpitations. Paroxysmal nocturnal dyspnea.  Syncope.    Still smoking.  Not trying to quit.    Past Medical History:  Diagnosis Date   Acute bronchitis 02/15/2013   Alcohol abuse    Anxiety    Bipolar disorder ([place])    Bronchitis    history   Bronchitis    Cellulitis    - left knee-2009   Chronic pain    Complication of anesthesia    Condyloma 02/04/2012   Depression    Diverticulosis    By colonoscopy June 2005   Dyspnea    on exertion at times   Elevated CK 09/2011   Emphysema lung ([place])    Emphysema of lung ([place])    Family history of anesthesia complication    Mother N/V   GERD (gastroesophageal reflux disease)    Glaucoma syndrome    does not use eye drops, "They burn".   Headache(784.0)    "alot; not daily" (07/23/2012)   Heavy smoker    Hemorrhoids    History of colostomy 03/21/2012   Left colectomy and anastomosis performed by Autumn Messing, MD on 02/18/13 for recurrent diverticulitis  Exploratory laparotomy preformed 02/27/12 for anastomotic leak and Hartman pouch/colostomy peformed Colostomy taken down January 2014   History of diverticulitis of colon 10/16/2011   History of dizziness    History of stomach ulcers 2005   HPV (human papilloma virus) infection    Hypertension    started 3 months ago   Hypoglycemia    Incisional hernia    Lower GI bleed    June 2005. Presumably secondary to diverticulosis.   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   Ventral hernia 01/11/2013   Repaired with mesh 05/20/13   Wrist pain 01/26/2019    Past Surgical History:  Procedure Laterality Date   ABDOMINAL EXPLORATION SURGERY  07/23/2012   w/LOA (07/23/2012)   [place] RESECTION  02/27/2012   Procedure: COLON RESECTION;  Surgeon:  Merrie Roof, MD;  Location: [facility];  Service: General;  Laterality: N/A;   COLOSTOMY  02/27/2012   Procedure: COLOSTOMY;  Surgeon: Merrie Roof, MD;  Location: [facility];  Service: General;  Laterality: N/A;   COLOSTOMY TAKEDOWN  07/23/2012   COLOSTOMY TAKEDOWN  07/23/2012   Procedure: COLOSTOMY TAKEDOWN;  Surgeon: Merrie Roof, MD;  Location: [facility];  Service: General;  Laterality: N/A;  Primary Anastomosis   ELBOW FRACTURE SURGERY  ~ 1975   left;  pins inserted    INCISION AND DRAINAGE ABSCESS N/A 04/05/2021   Procedure: INCISION AND DRAINAGE ABDOMINAL WALL ABSCESS;  Surgeon: Timothy Kussmaul, MD;  Location: [facility];  Service: General;  Laterality: N/A;   INSERTION OF MESH N/A 05/20/2013   Procedure: INSERTION OF MESH;  Surgeon: Merrie Roof, MD;  Location: [facility];  Service: General;  Laterality: N/A;   LAPAROSCOPIC LEFT COLON RESECTION  02/19/2012   SIGMOID   LAPAROTOMY  02/27/2012   Procedure: EXPLORATORY LAPAROTOMY;  Surgeon: Merrie Roof, MD;  Location: [facility];  Service: General;  Laterality: N/A;   LAPAROTOMY  07/23/2012   Procedure: EXPLORATORY LAPAROTOMY;  Surgeon: Merrie Roof, MD;  Location: [facility];  Service: General;  Laterality: N/A;   LESION DESTRUCTION  07/23/2012   Procedure: DESTRUCTION LESION ANUS;  Surgeon: Merrie Roof, MD;  Location: [facility];  Service: General;  Laterality: N/A;  DESTRUCTION ANAL CONDYLOMA   LYSIS OF ADHESION  07/23/2012   Procedure: LYSIS OF ADHESION;  Surgeon: Merrie Roof, MD;  Location: [facility];  Service: General;  Laterality: N/A;   VENTRAL HERNIA REPAIR  05/20/2013   Dr Marlon Pel HERNIA REPAIR N/A 05/20/2013   Procedure: VENTRAL HERNIA REPAIR ;  Surgeon: Merrie Roof, MD;  Location: [facility];  Service: General;  Laterality: N/A;   WART FULGURATION  02/19/2012   Procedure: FULGURATION ANAL WART;  Surgeon: Merrie Roof, MD;  Location: [facility];  Service: General;  Laterality: N/A;  Destroy  Anal Condyloma    WISDOM TOOTH EXTRACTION  ~ [place] N/A 06/03/2016   Procedure: ABDOMINAL WOUND EXPLORATION AND STITCH REMOVAL;  Surgeon: [name], MD;  Location: [facility];  Service: General;  Laterality: N/A;  ABDOMINAL WOUND EXPLORATION AND STITCH REMOVAL     Current Outpatient Medications  Medication Sig Dispense Refill   albuterol (PROVENTIL) (2.5 MG/3ML) 0.083% nebulizer solution Take 3 mLs (2.5 mg total) by nebulization every 6 (six) hours as needed  for wheezing or shortness of breath. 75 mL 12   albuterol (VENTOLIN HFA) 108 (90 Base) MCG/ACT inhaler INHALE 2 PUFFS INTO THE LUNGS EVERY 6 HOURS AS NEEDED FOR WHEEZING OR SHORTNESS OF BREATH 54 g 2   busPIRone (BUSPAR) 15 MG tablet Take 1 tablet (15 mg total) by mouth in the morning. 60 tablet 0   cyclobenzaprine (FLEXERIL) 10 MG tablet TAKE 1 TABLET BY MOUTH DAILY AS NEEDED FOR MUSCLE SPASM. NEED OFFICE VISIT FOR REFILLS 10 tablet 0   gabapentin (NEURONTIN) 100 MG capsule TAKE 1 CAPSULE BY MOUTH TWICE DAILY 90 capsule 3   losartan (COZAAR) 100 MG tablet TAKE 1 TABLET BY MOUTH EVERY DAY 90 tablet 2   pantoprazole (PROTONIX) 40 MG tablet TAKE 1 TABLET BY MOUTH DAILY 60 tablet 2   sertraline (ZOLOFT) 100 MG tablet TAKE 1 TABLET (100 MG TOTAL) BY MOUTH DAILY. 30 tablet 3   No current facility-administered medications for this visit.    Allergies:   Ambien [zolpidem tartrate], Bactroban [mupirocin calcium], Darvocet [propoxyphene n-acetaminophen], Zestril [lisinopril], and Morphine and related    Social History:  The patient  reports that he has been smoking cigarettes. He started smoking about 40 years ago. He has a 68.00 pack-year smoking history. He quit smokeless tobacco use about 11 years ago.  His smokeless tobacco use included chew. He reports current alcohol use of about 36.0 standard drinks of alcohol per week. He reports current drug use. Drugs: Oxycodone and Marijuana.   Family History:  The patient's family history includes Alcohol abuse in his  father; Anxiety disorder in his father; Breast cancer in his maternal aunt; Colon polyps in his father; Depression in his mother; Diabetes in his maternal grandmother and mother; Hypertension in his father; Parkinsonism in his mother; Ulcers in his father.    ROS:  Please see the history of present illness.   Otherwise, review of systems are positive for fatigue.   All other systems are reviewed and negative.    PHYSICAL EXAM: VS:  BP 114/76   Pulse (!) 113   Ht '5\' 9"'$  (1.753 m)   Wt 157 lb 9.6 oz (71.5 kg)   SpO2 96%   BMI 23.27 kg/m  , BMI Body mass index is 23.27 kg/m. GEN: Well nourished, well developed, in no acute distress HEENT: normal Neck: no JVD, carotid bruits, or masses Cardiac: RRR; no murmurs, rubs, or gallops,no edema  Respiratory:  clear to auscultation bilaterally, normal work of breathing GI: soft, nontender, nondistended, + BS MS: no deformity or atrophy; 2+ right PT pulse, 1+ left PT pulse Skin: warm and dry, no rash Neuro:  Strength and sensation are intact Psych: euthymic mood, full affect   EKG:   The ekg ordered today demonstrates sinus tach. LVH   Recent Labs: 07/24/2021: ALT 21 12/31/2021: BUN 16; Creatinine, Ser 0.85; Hemoglobin 14.0; Platelets 368; Potassium 4.2; Sodium 139   Lipid Panel    Component Value Date/Time   CHOL 155 08/14/2015 0935   TRIG 71 08/14/2015 0935   HDL 35 (L) 08/14/2015 0935   CHOLHDL 4.4 08/14/2015 0935   VLDL 14 08/14/2015 0935   LDLCALC 106 08/14/2015 0935     Other studies Reviewed: Additional studies/ records that were reviewed today with results demonstrating: LDL 106, HDL 35, TG 71, TC 155.   ASSESSMENT AND PLAN:  Preoperative cardiovascular exam: Needs extensive abdominal surgery.  Plan for echo.  No clear angina.  If function is normal by echocardiogram , no further cardiac  testing will be needed before surgery.  I advised him to stop smoking which would also help lower his perioperative risk.  In general, he  needs RF modification for prevention.  Diet with whole food plant-based foods.   Avoid processed foodshigh-fiber foods.  Regular exercise. Tobacco abuse: Needs to stop smoking.  He is currently not interested in quitting smoking at this time.  He feels that quitting just does not work for him. COPD:  DOE.  PLan for echo to eval cardiac function.  Resting tachycardia. Weight loss: Given the family history with the father having lung cancer and patient's long smoking history, may be a candidate for a CT scan to screen for lung cancer.  Will defer to PCP.     Current medicines are reviewed at length with the patient today.  The patient concerns regarding his medicines were addressed.  The following changes have been made:  No change  Labs/ tests ordered today include:  No orders of the defined types were placed in this encounter.   Recommend 150 minutes/week of aerobic exercise Low fat, low carb, high fiber diet recommended  Disposition:   FU for echo   Signed, Larae Grooms, MD  03/01/2022 9:44 AM    [facility] [address], [place], [place]  [address] Phone: [phone]; Fax: [phone]

## 2022-03-01 NOTE — Patient Instructions (Signed)
Medication Instructions:  ?Your physician recommends that you continue on your current medications as directed. Please refer to the Current Medication list given to you today. ? ?*If you need a refill on your cardiac medications before your next appointment, please call your pharmacy* ? ? ?Lab Work: ?none ?If you have labs (blood work) drawn today and your tests are completely normal, you will receive your results only by: ?MyChart Message (if you have MyChart) OR ?A paper copy in the mail ?If you have any lab test that is abnormal or we need to change your treatment, we will call you to review the results. ? ? ?Testing/Procedures: ?Your physician has requested that you have an echocardiogram. Echocardiography is a painless test that uses sound waves to create images of your heart. It provides your doctor with information about the size and shape of your heart and how well your heart?s chambers and valves are working. This procedure takes approximately one hour. There are no restrictions for this procedure. ? ? ? ?Follow-Up: ?At [facility], you and your health needs are our priority.  As part of our continuing mission to provide you with exceptional heart care, we have created designated Provider Care Teams.  These Care Teams include your primary Cardiologist (physician) and Advanced Practice Providers (APPs -  Physician Assistants and Nurse Practitioners) who all work together to provide you with the care you need, when you need it. ? ?We recommend signing up for the patient portal called "MyChart".  Sign up information is provided on this After Visit Summary.  MyChart is used to connect with patients for Virtual Visits (Telemedicine).  Patients are able to view lab/test results, encounter notes, upcoming appointments, etc.  Non-urgent messages can be sent to your provider as well.   ?To learn more about what you can do with MyChart, go to [email or web address].   ? ?Your next appointment:   ?As needed ? ?The  format for your next appointment:   ?In Person ? ?Provider:   ?Jayadeep Varanasi, MD   ? ? ?Other Instructions ?  ? ?Important Information About [place] ? ? ? ? ? ? ?

## 2022-03-08 ENCOUNTER — Ambulatory Visit (HOSPITAL_COMMUNITY): Payer: Medicare Other | Attending: Interventional Cardiology

## 2022-03-08 DIAGNOSIS — Z01810 Encounter for preprocedural cardiovascular examination: Secondary | ICD-10-CM | POA: Diagnosis present

## 2022-03-08 DIAGNOSIS — R0609 Other forms of dyspnea: Secondary | ICD-10-CM | POA: Diagnosis present

## 2022-03-08 LAB — ECHOCARDIOGRAM COMPLETE
Area-P 1/2: 4.54 cm2
S' Lateral: 3 cm

## 2022-04-01 ENCOUNTER — Other Ambulatory Visit: Payer: Self-pay | Admitting: Student

## 2022-04-01 DIAGNOSIS — F339 Major depressive disorder, recurrent, unspecified: Secondary | ICD-10-CM

## 2022-04-26 ENCOUNTER — Encounter (HOSPITAL_COMMUNITY): Payer: Self-pay | Admitting: Emergency Medicine

## 2022-04-26 ENCOUNTER — Other Ambulatory Visit: Payer: Self-pay

## 2022-04-26 ENCOUNTER — Emergency Department (HOSPITAL_COMMUNITY)
Admission: EM | Admit: 2022-04-26 | Discharge: 2022-04-26 | Discharge status: Against Medical Advice | Payer: Medicare Other | Attending: Emergency Medicine | Admitting: Emergency Medicine

## 2022-04-26 DIAGNOSIS — F1010 Alcohol abuse, uncomplicated: Secondary | ICD-10-CM | POA: Diagnosis not present

## 2022-04-26 DIAGNOSIS — I10 Essential (primary) hypertension: Secondary | ICD-10-CM | POA: Diagnosis not present

## 2022-04-26 DIAGNOSIS — J449 Chronic obstructive pulmonary disease, unspecified: Secondary | ICD-10-CM | POA: Diagnosis not present

## 2022-04-26 DIAGNOSIS — Z7951 Long term (current) use of inhaled steroids: Secondary | ICD-10-CM | POA: Insufficient documentation

## 2022-04-26 DIAGNOSIS — Z79899 Other long term (current) drug therapy: Secondary | ICD-10-CM | POA: Diagnosis not present

## 2022-04-26 DIAGNOSIS — F1090 Alcohol use, unspecified, uncomplicated: Secondary | ICD-10-CM | POA: Diagnosis present

## 2022-04-26 MED ORDER — SODIUM CHLORIDE 0.9 % IV BOLUS: 1000.0000 mL | Freq: Once | INTRAVENOUS | Status: DC

## 2022-04-26 NOTE — ED Notes (Signed)
Upon entering room to obtain labs and start NS bolus pt was sitting on foot of the bed requesting to go home.  Spoke with pt about allowing me to just give him his IV fluids and possibly wait for his family to come.  Pt stated he just wanted to go home.  EDP aware.  Pt's IV removed. No bleeding noted.  Pt alert and oriented, no slurred speech, ambulated from the department with steady gait in NAD.

## 2022-04-26 NOTE — ED Triage Notes (Addendum)
Pt BIB EMS from home. Children called EMS  Pt states he normally takes 400 mg of gabapentin a day as prescribed.  Has been drinking "for days" trying to sleep after the loss of his parents.  Pt did take 1600 mg of gabapentin in an attempt to sleep.  Pt denies SI/HI  Visibly intoxicated.  Pt endorses drinking at least 24 beers today.  Reqeusting to leave "when my son gets here to pick me up"

## 2022-04-26 NOTE — ED Provider Notes (Signed)
[facility] DEPT Provider Note   CSN: [id number] Arrival date & time: 04/26/22  1648     History  Chief Complaint  Patient presents with   Drug Overdose   Alcohol Intoxication    Timothy [name] is a 56 y.o. male.  Pt is a 56 yo male with a pmhx significant for GERD, alcohol abuse, HTN, COPD, and multiple abd surgeries.  Pt said he always has pain in his abd.  He recently saw Dr. Marlou Starks (on 7/14) who recommended an exploratory lap with removal of his mesh.  He saw Dr. Irish Lack on 8/11 for preop clearance.  Plan for for an ECHO which was done on 8/18.  He had some mild global hypokinesis.  Last note from Dr. Irish Lack said if function was normal, he can proceed with surgery.  I can't find any other cardiac studies ordered.  Anyway, pt said he took extra neurontin because he's not been able to sleep due to the pain and a recent death.  He drinks heavily and drank 24 beers today.  His son called EMS because they were concerned about him.  Pt said he is not suicidal.  He does not want to be here and wants to go back home.       Home Medications Prior to Admission medications   Medication Sig Start Date End Date Taking? Authorizing Provider  albuterol (PROVENTIL) (2.5 MG/3ML) 0.083% nebulizer solution Take 3 mLs (2.5 mg total) by nebulization every 6 (six) hours as needed for wheezing or shortness of breath. 05/19/20   Matilde Haymaker, MD  albuterol (VENTOLIN HFA) 108 (90 Base) MCG/ACT inhaler INHALE 2 PUFFS INTO THE LUNGS EVERY 6 HOURS AS NEEDED FOR WHEEZING OR SHORTNESS OF BREATH 02/25/22   Gerrit Heck, MD  busPIRone (BUSPAR) 15 MG tablet Take 1 tablet (15 mg total) by mouth in the morning. 02/25/22   Gerrit Heck, MD  cyclobenzaprine (FLEXERIL) 10 MG tablet TAKE 1 TABLET BY MOUTH DAILY AS NEEDED FOR MUSCLE SPASM. NEED OFFICE VISIT FOR REFILLS 04/01/22   Gerrit Heck, MD  gabapentin (NEURONTIN) 100 MG capsule TAKE 1 CAPSULE BY MOUTH TWICE DAILY 12/20/21    Gerrit Heck, MD  losartan (COZAAR) 100 MG tablet TAKE 1 TABLET BY MOUTH EVERY DAY 07/10/21   Gerrit Heck, MD  pantoprazole (PROTONIX) 40 MG tablet TAKE 1 TABLET BY MOUTH DAILY 02/20/22   Gerrit Heck, MD  sertraline (ZOLOFT) 100 MG tablet TAKE 1 TABLET (100 MG TOTAL) BY MOUTH DAILY. 04/01/22   Gerrit Heck, MD  lurasidone (LATUDA) 40 MG TABS tablet TAKE 1 TABLET(40 MG) BY MOUTH DAILY WITH BREAKFAST 05/12/19 05/08/20  Nuala Alpha, MD  mometasone-formoterol (DULERA) 100-5 MCG/ACT AERO Inhale 2 puffs into the lungs 2 (two) times daily. 02/22/19 05/19/20  Nuala Alpha, MD      Allergies    Ambien [zolpidem tartrate], Bactroban [mupirocin calcium], Darvocet [propoxyphene n-acetaminophen], Zestril [lisinopril], and Morphine and related    Review of Systems   Review of Systems  Gastrointestinal:  Positive for abdominal pain.  All other systems reviewed and are negative.   Physical Exam Updated Vital Signs BP 115/84   Pulse 100   Temp 97.7 F (36.5 C)   Resp 17   Ht '5\' 9"'$  (1.753 m)   SpO2 96%   BMI 23.27 kg/m  Physical Exam Vitals and nursing note reviewed.  Constitutional:      Appearance: Normal appearance.  HENT:     Head: Normocephalic and atraumatic.     Right Ear:  External ear normal.     Left Ear: External ear normal.     Nose: Nose normal.     Mouth/Throat:     Mouth: Mucous membranes are moist.     Pharynx: Oropharynx is clear.  Eyes:     Extraocular Movements: Extraocular movements intact.     Conjunctiva/sclera: Conjunctivae normal.     Pupils: Pupils are equal, round, and reactive to light.  Cardiovascular:     Rate and Rhythm: Normal rate and regular rhythm.     Pulses: Normal pulses.     Heart sounds: Normal heart sounds.  Pulmonary:     Effort: Pulmonary effort is normal.     Breath sounds: Normal breath sounds.  Abdominal:     General: Abdomen is flat. Bowel sounds are normal.     Palpations: Abdomen is soft.     Tenderness: There is  generalized abdominal tenderness.  Musculoskeletal:        General: Normal range of motion.     Cervical back: Normal range of motion and neck supple.  Skin:    General: Skin is warm.     Capillary Refill: Capillary refill takes less than 2 seconds.  Neurological:     General: No focal deficit present.     Mental Status: He is alert and oriented to person, place, and time.  Psychiatric:        Mood and Affect: Mood normal.        Behavior: Behavior normal.     ED Results / Procedures / Treatments   Labs (all labs ordered are listed, but only abnormal results are displayed) Labs Reviewed  COMPREHENSIVE METABOLIC PANEL  SALICYLATE LEVEL  ACETAMINOPHEN LEVEL  ETHANOL  RAPID URINE DRUG SCREEN, HOSP PERFORMED  CBC WITH DIFFERENTIAL/PLATELET  CBG MONITORING, ED    EKG None  Radiology No results found.  Procedures Procedures    Medications Ordered in ED Medications  sodium chloride 0.9 % bolus 1,000 mL (has no administration in time range)    ED Course/ Medical Decision Making/ A&P                           Medical Decision Making Amount and/or Complexity of Data Reviewed Labs: ordered.   This patient presents to the ED for concern of abd pain/drug od, this involves an extensive number of treatment options, and is a complaint that carries with it a high risk of complications and morbidity.  The differential diagnosis includes drug od, chronic pain,    Co morbidities that complicate the patient evaluation  GERD, alcohol abuse, HTN, COPD, and multiple abd surgeries   Additional history obtained:  Additional history obtained from epic chart review  Lab Tests:  I Ordered labs, but pt refused   Cardiac Monitoring:  The patient was maintained on a cardiac monitor.  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  He is awake and alert.  He is able to make decisions.  He told the nurse he wanted to go.  I don't see  a reason I can use to keep him IVC.  He was told he'd leave AMA as he has not allowed [place] to do labs, etc.    Social Determinants of Health:  Lives at home   Dispostion:  Eloped/AMA        Final Clinical Impression(s) / ED Diagnoses Final diagnoses:  Alcohol abuse    Rx / DC Orders ED Discharge Orders     None         Isla Pence, MD 04/26/22 [phone]

## 2022-04-30 ENCOUNTER — Telehealth: Payer: Self-pay

## 2022-04-30 NOTE — Telephone Encounter (Signed)
     Patient  visited [facility] on 04/26/2022    Telephone encounter attempt :  1st attempt  A HIPAA compliant voice message was left requesting a return call.  Instructed patient to call back at [phone] at their earliest convenience.  [facility] management  [place], [place]  Main Phone: [phone]  E-mail: [email or web address]  Website: [email or web address]

## 2022-05-01 ENCOUNTER — Telehealth: Payer: Self-pay

## 2022-05-01 NOTE — Telephone Encounter (Signed)
     Reason for call: ED-Follow up call    Patient  visited [facility] on 04/26/2022    Telephone encounter attempt :  2nd Attempt  A HIPAA compliant voice message was left requesting a return call.  Instructed patient to call back at [phone] at their earliest convenience.  [facility] management  [place], [place]  Main Phone: [phone]  E-mail: [email or web address]  Website: [email or web address]

## 2022-05-02 ENCOUNTER — Telehealth: Payer: Self-pay

## 2022-05-02 NOTE — Telephone Encounter (Signed)
     Patient  visited [place] [facility] on 04/26/2022    Telephone encounter attempt :  3rd Attempt  A HIPAA compliant voice message was left requesting a return call.  Instructed patient to call back at [phone] at their earliest convenience.  Left message 3 times will close follow up.  [facility] management  [place], [place]  Main Phone: [phone]  E-mail: [email or web address]  Website: [email or web address]

## 2022-05-16 ENCOUNTER — Other Ambulatory Visit: Payer: Self-pay | Admitting: Student

## 2022-05-16 DIAGNOSIS — F410 Panic disorder [episodic paroxysmal anxiety] without agoraphobia: Secondary | ICD-10-CM

## 2022-06-14 ENCOUNTER — Telehealth: Payer: Self-pay | Admitting: Family Medicine

## 2022-06-14 ENCOUNTER — Other Ambulatory Visit: Payer: Self-pay | Admitting: Family Medicine

## 2022-06-14 ENCOUNTER — Other Ambulatory Visit: Payer: Self-pay | Admitting: Student

## 2022-06-14 DIAGNOSIS — I10 Essential (primary) hypertension: Secondary | ICD-10-CM

## 2022-06-14 MED ORDER — LOSARTAN POTASSIUM 100 MG PO TABS: ORAL_TABLET | ORAL | 0 refills | Status: DC

## 2022-06-14 NOTE — Telephone Encounter (Signed)
After hours emergency line  Patient calls the after hours line with regard to his BP meds, he reports that he did not realize that the office was closed but he is completely out of his BP medications. Denies any symptoms but states that he initially needed an appointment before getting refills. I explained that although we typically do not refill prescription requests, I have sent him in a short course of his losartan to ensure his blood pressures stay well-controlled. I also scheduled him an appointment with Dr. Jinny Sanders on 12/4 which works out great for him. Losartan sent to desired pharmacy. All questions answered. Patient was appreciative.

## 2022-06-24 ENCOUNTER — Ambulatory Visit (INDEPENDENT_AMBULATORY_CARE_PROVIDER_SITE_OTHER): Payer: Medicare Other | Admitting: Student

## 2022-06-24 ENCOUNTER — Encounter: Payer: Self-pay | Admitting: Student

## 2022-06-24 VITALS — BP 136/90 | HR 67 | Ht 69.0 in | Wt 155.8 lb

## 2022-06-24 DIAGNOSIS — R0602 Shortness of breath: Secondary | ICD-10-CM

## 2022-06-24 DIAGNOSIS — J441 Chronic obstructive pulmonary disease with (acute) exacerbation: Secondary | ICD-10-CM | POA: Diagnosis not present

## 2022-06-24 DIAGNOSIS — J449 Chronic obstructive pulmonary disease, unspecified: Secondary | ICD-10-CM | POA: Diagnosis not present

## 2022-06-24 DIAGNOSIS — Z13228 Encounter for screening for other metabolic disorders: Secondary | ICD-10-CM

## 2022-06-24 DIAGNOSIS — G894 Chronic pain syndrome: Secondary | ICD-10-CM

## 2022-06-24 DIAGNOSIS — F1010 Alcohol abuse, uncomplicated: Secondary | ICD-10-CM

## 2022-06-24 DIAGNOSIS — I10 Essential (primary) hypertension: Secondary | ICD-10-CM

## 2022-06-24 DIAGNOSIS — R29818 Other symptoms and signs involving the nervous system: Secondary | ICD-10-CM | POA: Diagnosis not present

## 2022-06-24 DIAGNOSIS — R110 Nausea: Secondary | ICD-10-CM

## 2022-06-24 DIAGNOSIS — G4730 Sleep apnea, unspecified: Secondary | ICD-10-CM

## 2022-06-24 DIAGNOSIS — R0609 Other forms of dyspnea: Secondary | ICD-10-CM

## 2022-06-24 MED ORDER — ONDANSETRON 4 MG PO TBDP: 4.0000 mg | ORAL_TABLET | Freq: Three times a day (TID) | ORAL | 0 refills | Status: DC | PRN

## 2022-06-24 MED ORDER — ALBUTEROL SULFATE HFA 108 (90 BASE) MCG/ACT IN AERS: 2.0000 | INHALATION_SPRAY | Freq: Four times a day (QID) | RESPIRATORY_TRACT | 2 refills | Status: DC | PRN

## 2022-06-24 MED ORDER — GABAPENTIN 100 MG PO CAPS: ORAL_CAPSULE | ORAL | 3 refills | Status: DC

## 2022-06-24 NOTE — Assessment & Plan Note (Addendum)
Has been drinking significant amount of alcohol 12-18 beers a day.  I offered psychiatry referral again and patient declined. -Continue gabapentin -Encourage cessation -Hep C screen

## 2022-06-24 NOTE — Assessment & Plan Note (Signed)
Related to multiple abdominal surgeries and abdominal spasms. -Continue gabapentin 4 times daily as needed

## 2022-06-24 NOTE — Assessment & Plan Note (Signed)
Currently on losartan 100 mg daily. 136/90 in office. -continue losartan

## 2022-06-24 NOTE — Progress Notes (Signed)
SUBJECTIVE:   CHIEF COMPLAINT / HPI: Medication refills and Dyspena  COPD  dyspnea on exertion Patient has been using albuterol 4-5 puffs 3-4 times a day for the past month and a half. He feels more out of breath when walking than his usual and denies any chest pain on exertion.  Denies any leg swelling.  He had previously had hemoptysis earlier this year but denies any hemoptysis currently.  Has been having a chronic cough that he feels like is his normal although slightly more sputum in the past month and a half.  Denies any change in the color of the sputum.  In the past he has tried Spiriva, Trelegy, [facility] however he feels like it does not help him much and has not been taking these.  Denies any fevers however has been having some B symptoms.  Does have nausea, and feels he has been losing weight without trying.  He has been smoking daily although he says he is cut down a little bit.  He says he is worried about lung cancer says that had died of lung cancer.  Apnea symptoms Patient says for the past month and a half he has been waking up in the middle night choking feeling like he cannot breathe.  He says he has never gotten a sleep study before.  He denies any issues laying flat on the bed.  Abdominal spasms  chronic pain Has had multiple abdominal surgeries before.  Currently taking gabapentin 100 mg 3-4 times a day.  Hypertension: Patient is a 56 y.o. male who present today for follow up of hypertension.   Patient endorses no problems  Home medications include: losartan 100 mg  Patient endorses taking these medications as prescribed. Denies any headache, vision changes, shortness of breath, lower extremity swelling or chest pain   Most recent creatinine trend:  Lab Results  Component Value Date   CREATININE 0.85 12/31/2021   CREATININE 0.87 07/25/2021   CREATININE 0.74 07/24/2021   Alcohol abuse Patient says he has been drinking 12-18 beers a day.  He says if he skips a  day that he will "make up" for it on a different day.  He is not interested in psychiatrist currently although I encouraged this.  PERTINENT  PMH / PSH:   OBJECTIVE:   BP (!) 136/90   Pulse 67   Ht '5\' 9"'$  (1.753 m)   Wt 155 lb 12.8 oz (70.7 kg)   SpO2 100%   BMI 23.01 kg/m   General: NAD, awake, alert, responsive to questions Head: Normocephalic atraumatic CV: Regular rate and rhythm no murmurs rubs or gallops Respiratory: Clear to ausculation bilaterally, no wheezes rales or crackles, chest rises symmetrically,  no increased work of breathing on RA Abdomen: Soft, non-tender, non-distended, normoactive bowel sounds  Extremities: Moves upper and lower extremities freely, no edema in LE  ASSESSMENT/PLAN:   Essential hypertension, benign Currently on losartan 100 mg daily. 136/90 in office. -continue losartan   Chronic obstructive pulmonary disease ([place]) Has been having worsening dyspnea on exertion for the past month and a half.  Currently only on albuterol and declines adding any COPD medications today.  Denies denies any chest pain or swelling, cardiac cause less likely but is following with cardiologist.  Does have some B symptoms of weight loss and nausea as well as weight loss from 187 earlier this year to 155 today and history of smoking worrisome for lung cancer.  Did have a history of hemoptysis earlier this  year but none currently.  Doubt acute COPD exacerbation given similar sputum however discussed calling us if this worsens or changes. -Scheduled Dr. Valentina Lucks pharmacy appointment -CT chest without contrast (patient has intolerance to contrast agent gets severe diarrhea) -Zofran ODT as needed  -Return precautions discussed  Suspected sleep apnea Patient has been choking and waking up in the middle of the night for the past month and a half. -Sleep study ordered  Chronic pain syndrome Related to multiple abdominal surgeries and abdominal spasms. -Continue gabapentin 4  times daily as needed  Alcohol abuse Has been drinking significant amount of alcohol 12-18 beers a day.  I offered psychiatry referral again and patient declined. -Continue gabapentin -Encourage cessation -Hep C screen    Gerrit Heck, MD [place]

## 2022-06-24 NOTE — Assessment & Plan Note (Signed)
Patient has been choking and waking up in the middle of the night for the past month and a half. -Sleep study ordered

## 2022-06-24 NOTE — Patient Instructions (Addendum)
It was great to see you! Thank you for allowing me to participate in your care!   Our plans for today:  - Schedule follow up with Dr. Valentina Lucks - We will get Chest CT to evaluate  - I have refilled meds for you - I am referring for sleep study  Take care and seek immediate care sooner if you develop any concerns.  Gerrit Heck, MD

## 2022-06-24 NOTE — Assessment & Plan Note (Signed)
Has been having worsening dyspnea on exertion for the past month and a half.  Currently only on albuterol and declines adding any COPD medications today.  Denies denies any chest pain or swelling, cardiac cause less likely but is following with cardiologist.  Does have some B symptoms of weight loss and nausea as well as weight loss from 187 earlier this year to 155 today and history of smoking worrisome for lung cancer.  Did have a history of hemoptysis earlier this year but none currently.  Doubt acute COPD exacerbation given similar sputum however discussed calling us if this worsens or changes. -Scheduled Dr. Valentina Lucks pharmacy appointment -CT chest without contrast (patient has intolerance to contrast agent gets severe diarrhea) -Zofran ODT as needed  -Return precautions discussed

## 2022-06-25 LAB — HCV AB W REFLEX TO QUANT PCR: HCV Ab: NONREACTIVE

## 2022-06-25 LAB — HCV INTERPRETATION

## 2022-07-01 ENCOUNTER — Encounter: Payer: Self-pay | Admitting: Pharmacist

## 2022-07-01 ENCOUNTER — Ambulatory Visit (INDEPENDENT_AMBULATORY_CARE_PROVIDER_SITE_OTHER): Payer: Medicare Other | Admitting: Pharmacist

## 2022-07-01 VITALS — BP 151/87 | HR 70 | Ht 69.5 in | Wt 152.8 lb

## 2022-07-01 DIAGNOSIS — Z720 Tobacco use: Secondary | ICD-10-CM

## 2022-07-01 DIAGNOSIS — J449 Chronic obstructive pulmonary disease, unspecified: Secondary | ICD-10-CM

## 2022-07-01 MED ORDER — ANORO ELLIPTA 62.5-25 MCG/ACT IN AEPB: 1.0000 | INHALATION_SPRAY | Freq: Every day | RESPIRATORY_TRACT | 6 refills | Status: DC

## 2022-07-01 MED ORDER — VARENICLINE TARTRATE 0.5 MG PO TABS: 0.5000 mg | ORAL_TABLET | Freq: Two times a day (BID) | ORAL | 3 refills | Status: DC

## 2022-07-01 NOTE — Progress Notes (Signed)
   S:     Chief Complaint  Patient presents with   Medication Management    PFT - Shortness of Breath   Timothy [name] is a 56 y.o. male who presents for lung function evaluation.  PMH is significant for tobacco use disorder, and dyspnea.  Patient was referred and last seen by Primary Care Provider, Dr. Jinny Sanders, on 06/24/2022.   At last visit, expressed concern over increasing shortness of breath, and weight loss despite increase in meal intake over the last 2 months. .   Patient reports breathing has been worsening including inability to work in yard as long without shortness of breath.   Medication adherence reported good with albuterol (taking 4-5puffs 4x/day) - also uses albuterol via nebulizer ~ 1 time per month. Patient reports last dose of COPD medications was this AM 4-5 puffs Current COPD medications: None -other than albuterol. Rescue inhaler use frequency: increased over the last several months.   O: Review of Systems  Respiratory:  Positive for cough, sputum production and shortness of breath. Negative for hemoptysis.   Musculoskeletal:  Positive for back pain and joint pain.  Psychiatric/Behavioral:  The patient has insomnia (trouble staying asleep).     Physical Exam Constitutional:      Appearance: Normal appearance. He is normal weight.  Pulmonary:     Effort: Pulmonary effort is normal.  Neurological:     Mental Status: He is alert.  Psychiatric:        Mood and Affect: Mood normal.        Behavior: Behavior normal.        Thought Content: Thought content normal.     Vitals:   07/01/22 0938  BP: (!) 151/87  Pulse: 70  SpO2: 99%    mMRC score= 2 CAT score= 30/40 (highly symptomatic) See Documentation Flowsheet - CAT/COPD for complete symptom scoring.  See "scanned report" or Documentation Flowsheet (discrete results - PFTs) for Spirometry results. Patient provided good effort while attempting spirometry.   Lung Age = 52 Albuterol Neb  Lot#  [id number]     Exp. 08/2023  Patient is participating in a Managed Medicaid Plan:  Yes   A/P: Patient has been experiencing increasing shortness of breath for several months and taking albuterol 4-5 puffs multiple times per day. Spirometry evaluation reveals similar findings to last COPD evaluation 09/2017.  Patient took 4-5 puffs of albuterol this AM prior to his appointment and post nebulized albuterol tx revealed minimal change.  Patient is symptomatic with CAT score of 30/40.  He reports lack of efficacy from Trelegy ellipta in the past and did not like the dry mouth associated with Spiriva Handihaller.  - Initiate Anoro (umeclidinium/vilanterol) Ellipta 1 inhalation daily (attempting to minimize albuterol use).  -Reviewed results of pulmonary function tests.  Pt verbalized understanding of results and education.    Chronic - tobacco use disorder with history of 2 ppd for almost 40 years.  Willing to attempt intake reduction strategy with use of varenicline.  - Initiate varenicline 0.'5mg'$  BID with goal of reducing to 30 or fewer cigarettes in the next 30 days.    Written patient instructions provided.   Total time in face to face counseling 55 minutes.    Follow-up:  Pharmacist:  Early January 2024. PCP clinic visit: tomorrow

## 2022-07-01 NOTE — Assessment & Plan Note (Signed)
Patient has been experiencing increasing shortness of breath for several months and taking albuterol 4-5 puffs multiple times per day. Spirometry evaluation reveals similar findings to last COPD evaluation 09/2017.  Patient took 4-5 puffs of albuterol this AM prior to his appointment and post nebulized albuterol tx revealed minimal change.  Patient is symptomatic with CAT score of 30/40.  He reports lack of efficacy from Trelegy ellipta in the past and did not like the dry mouth associated with Spiriva Handihaller.  - Initiate Anoro (umeclidinium/vilanterol) Ellipta 1 inhalation daily (attempting to minimize albuterol use).  -Reviewed results of pulmonary function tests.  Pt verbalized understanding of results and education.

## 2022-07-01 NOTE — Patient Instructions (Addendum)
Nice to see you again today.   Overall, good news, lung function test is similar to last check 09/2017.  Goal is to redude to 30 or less cigarettes per day with use of varenicline 0.'5mg'$  tablets.  - Please take 1 daily with food for the first week then take twice daily with food.   Start Anoro Ellipta 1 inhalation once daily.  Continue Albuterol as needed - you should you less with the Anoro.

## 2022-07-02 ENCOUNTER — Encounter: Payer: Self-pay | Admitting: Student

## 2022-07-02 ENCOUNTER — Ambulatory Visit (INDEPENDENT_AMBULATORY_CARE_PROVIDER_SITE_OTHER): Payer: Medicare Other | Admitting: Student

## 2022-07-02 VITALS — BP 143/90 | HR 85 | Ht 69.0 in | Wt 157.2 lb

## 2022-07-02 DIAGNOSIS — J449 Chronic obstructive pulmonary disease, unspecified: Secondary | ICD-10-CM | POA: Diagnosis not present

## 2022-07-02 DIAGNOSIS — Z1211 Encounter for screening for malignant neoplasm of colon: Secondary | ICD-10-CM | POA: Diagnosis present

## 2022-07-02 DIAGNOSIS — F39 Unspecified mood [affective] disorder: Secondary | ICD-10-CM

## 2022-07-02 NOTE — Progress Notes (Signed)
Noted and agree. 

## 2022-07-02 NOTE — Assessment & Plan Note (Signed)
I discussed with patient if he is not able to fill his inhaler at his pharmacy to please call our clinic back. -Anoro ellipta -albuterol prn

## 2022-07-02 NOTE — Progress Notes (Signed)
    SUBJECTIVE:   CHIEF COMPLAINT / HPI:   Mood Disorder  History of bipolar disease Previously on latuda and hx of bipolar in chart. Patient currently on sertraline 100 mg daily. Bipolar disorder diagnosed in past.  Patient had previously seen Dr. Gwenlyn Saran and Dr. Tammi Klippel psychiatry in our office.  He feels like of all the medication regimen sertraline has helped him the most. Feels very balanced currently.  Patient. Mom and brother also have bipolar disorder.  He did not feel like Latuda was helpful when he was on it. In past year has more energy and summer but denies any outright mania or risk-taking behaviors although patient did does consume significant amount of alcohol. Sleeping worse in past 3 months. 4-5 hours of sleep a day now for past year. Morning he has the most energy than after 1 PM he gets very tired. Doesn't like being around a ton of people.      COPD Has been using albuterol 3-5 times a day.  Dr. Valentina Lucks saw him yesterday in clinic and prescribed him Chantix as well as Anoro Ellipta.    PERTINENT  PMH / PSH: HTN, COPD   OBJECTIVE:   BP (!) 143/90 Comment: provider informd  Pulse 85   Ht '5\' 9"'$  (1.753 m)   Wt 157 lb 4 oz (71.3 kg)   SpO2 100%   BMI 23.22 kg/m   General: Well appearing, NAD, awake, alert, responsive to questions Head: Normocephalic atraumatic CV: Regular rate and rhythm no murmurs rubs or gallops Respiratory: Clear to ausculation bilaterally, no wheezes rales or crackles, chest rises symmetrically,  no increased work of breathing on RA Abdomen: Soft, non-tender, non-distended, normoactive bowel sounds  Extremities: Moves upper and lower extremities freely, no edema in LE Neuro: No focal deficits Skin: No rashes or lesions visualized   ASSESSMENT/PLAN:   Mood disorder (HCC) Patient has history of bipolar disorder in chart.  He is felt very stable on sertraline and feels like he is on the best regimen he is ever been on currently.  He is reluctant to  add on any mood stabilizers to psychiatry.  I discussed that it would be helpful to plug-in with psychiatry.  -Continue sertraline 100 mg daily -Monitor, consider psychiatry referral as patient is thinking about it  Chronic obstructive pulmonary disease ([place]) I discussed with patient if he is not able to fill his inhaler at his pharmacy to please call our clinic back. -Anoro ellipta -albuterol prn   Gerrit Heck, MD [place]

## 2022-07-02 NOTE — Assessment & Plan Note (Signed)
Patient has history of bipolar disorder in chart.  He is felt very stable on sertraline and feels like he is on the best regimen he is ever been on currently.  He is reluctant to add on any mood stabilizers to psychiatry.  I discussed that it would be helpful to plug-in with psychiatry.  -Continue sertraline 100 mg daily -Monitor, consider psychiatry referral as patient is thinking about it

## 2022-07-02 NOTE — Patient Instructions (Addendum)
It was great to see you! Thank you for allowing me to participate in your care!   Our plans for today:  - Encounter Information  Provider [place]  07/05/2022 1:30 PM [facility] [place] [facility]   -We will continue you on your Zoloft given you have been stable, but please consider psychiatry referral for further evaluation of bipolar disorder/mood stabilizer -We recommend a colonoscopy for you as well -Please check your pharmacy for the inhalers, if those are not available please call our office back  Take care and seek immediate care sooner if you develop any concerns.  Gerrit Heck, MD

## 2022-07-05 ENCOUNTER — Ambulatory Visit (HOSPITAL_COMMUNITY): Payer: Medicare Other

## 2022-07-11 ENCOUNTER — Ambulatory Visit (HOSPITAL_COMMUNITY)
Admission: RE | Admit: 2022-07-11 | Discharge: 2022-07-11 | Disposition: A | Discharge status: Home / Self Care | Payer: Medicare Other | Source: Ambulatory Visit | Attending: Family Medicine | Admitting: Family Medicine

## 2022-07-11 DIAGNOSIS — R0609 Other forms of dyspnea: Secondary | ICD-10-CM | POA: Diagnosis present

## 2022-07-12 ENCOUNTER — Encounter: Payer: Self-pay | Admitting: Physician Assistant

## 2022-07-16 NOTE — Patient Instructions (Signed)
Timothy [name] , Thank you for taking time to come for your Medicare Wellness Visit. I appreciate your ongoing commitment to your health goals. Please review the following plan we discussed and let me know if I can assist you in the future.   These are the goals we discussed:  Goals      Quit smoking / using tobacco        This is a list of the screening recommended for you and due dates:  Health Maintenance  Topic Date Due   Medicare Annual Wellness Visit  Never done   DTaP/Tdap/Td vaccine (1 - Tdap) Never done   Zoster (Shingles) Vaccine (1 of 2) Never done   Colon Cancer Screening  12/23/2021   Flu Shot  10/20/2022*   COVID-19 Vaccine (1) 10/20/2022*   Screening for Lung Cancer  07/12/2023   Hepatitis C Screening: USPSTF Recommendation to screen - Ages 18-79 yo.  Completed   HIV Screening  Completed   HPV Vaccine  Aged Out  *Topic was postponed. The date shown is not the original due date.    Advanced directives: ***  Conditions/risks identified: ***  Next appointment: Follow up in one year for your annual wellness visit ***  Preventive Care 40-64 Years, Male Preventive care refers to lifestyle choices and visits with your health care provider that can promote health and wellness. What does preventive care include? A yearly physical exam. This is also called an annual well check. Dental exams once or twice a year. Routine eye exams. Ask your health care provider how often you should have your eyes checked. Personal lifestyle choices, including: Daily care of your teeth and gums. Regular physical activity. Eating a healthy diet. Avoiding tobacco and drug use. Limiting alcohol use. Practicing safe sex. Taking low-dose aspirin every day starting at age 76. What happens during an annual well check? The services and screenings done by your health care provider during your annual well check will depend on your age, overall health, lifestyle risk factors, and family history of  disease. Counseling  Your health care provider may ask you questions about your: Alcohol use. Tobacco use. Drug use. Emotional well-being. Home and relationship well-being. Sexual activity. Eating habits. Work and work Statistician. Screening  You may have the following tests or measurements: Height, weight, and BMI. Blood pressure. Lipid and cholesterol levels. These may be checked every 5 years, or more frequently if you are over 52 years old. Skin check. Lung cancer screening. You may have this screening every year starting at age 6 if you have a 30-pack-year history of smoking and currently smoke or have quit within the past 15 years. Fecal occult blood test (FOBT) of the stool. You may have this test every year starting at age 80. Flexible sigmoidoscopy or colonoscopy. You may have a sigmoidoscopy every 5 years or a colonoscopy every 10 years starting at age 65. Prostate cancer screening. Recommendations will vary depending on your family history and other risks. Hepatitis C blood test. Hepatitis B blood test. Sexually transmitted disease (STD) testing. Diabetes screening. This is done by checking your blood sugar (glucose) after you have not eaten for a while (fasting). You may have this done every 1-3 years. Discuss your test results, treatment options, and if necessary, the need for more tests with your health care provider. Vaccines  Your health care provider may recommend certain vaccines, such as: Influenza vaccine. This is recommended every year. Tetanus, diphtheria, and acellular pertussis (Tdap, Td) vaccine. You may need  a Td booster every 10 years. Zoster vaccine. You may need this after age 83. Pneumococcal 13-valent conjugate (PCV13) vaccine. You may need this if you have certain conditions and have not been vaccinated. Pneumococcal polysaccharide (PPSV23) vaccine. You may need one or two doses if you smoke cigarettes or if you have certain conditions. Talk to your  health care provider about which screenings and vaccines you need and how often you need them. This information is not intended to replace advice given to you by your health care provider. Make sure you discuss any questions you have with your health care provider. Document Released: 08/04/2015 Document Revised: 03/27/2016 Document Reviewed: 05/09/2015 Elsevier Interactive Patient Education  2017 [place] Prevention in the Home Falls can cause injuries. They can happen to people of all ages. There are many things you can do to make your home safe and to help prevent falls. What can I do on the outside of my home? Regularly fix the edges of walkways and driveways and fix any cracks. Remove anything that might make you trip as you walk through a door, such as a raised step or threshold. Trim any bushes or trees on the path to your home. Use bright outdoor lighting. Clear any walking paths of anything that might make someone trip, such as rocks or tools. Regularly check to see if handrails are loose or broken. Make sure that both sides of any steps have handrails. Any raised decks and porches should have guardrails on the edges. Have any leaves, snow, or ice cleared regularly. Use sand or salt on walking paths during winter. Clean up any spills in your garage right away. This includes oil or grease spills. What can I do in the bathroom? Use night lights. Install grab bars by the toilet and in the tub and shower. Do not use towel bars as grab bars. Use non-skid mats or decals in the tub or shower. If you need to sit down in the shower, use a plastic, non-slip stool. Keep the floor dry. Clean up any water that spills on the floor as soon as it happens. Remove soap buildup in the tub or shower regularly. Attach bath mats securely with double-sided non-slip rug tape. Do not have throw rugs and other things on the floor that can make you trip. What can I do in the bedroom? Use night  lights. Make sure that you have a light by your bed that is easy to reach. Do not use any sheets or blankets that are too big for your bed. They should not hang down onto the floor. Have a firm chair that has side arms. You can use this for support while you get dressed. Do not have throw rugs and other things on the floor that can make you trip. What can I do in the kitchen? Clean up any spills right away. Avoid walking on wet floors. Keep items that you use a lot in easy-to-reach places. If you need to reach something above you, use a strong step stool that has a grab bar. Keep electrical cords out of the way. Do not use floor polish or wax that makes floors slippery. If you must use wax, use non-skid floor wax. Do not have throw rugs and other things on the floor that can make you trip. What can I do with my stairs? Do not leave any items on the stairs. Make sure that there are handrails on both sides of the stairs and use them. Fix  handrails that are broken or loose. Make sure that handrails are as long as the stairways. Check any carpeting to make sure that it is firmly attached to the stairs. Fix any carpet that is loose or worn. Avoid having throw rugs at the top or bottom of the stairs. If you do have throw rugs, attach them to the floor with carpet tape. Make sure that you have a light switch at the top of the stairs and the bottom of the stairs. If you do not have them, ask someone to add them for you. What else can I do to help prevent falls? Wear shoes that: Do not have high heels. Have rubber bottoms. Are comfortable and fit you well. Are closed at the toe. Do not wear sandals. If you use a stepladder: Make sure that it is fully opened. Do not climb a closed stepladder. Make sure that both sides of the stepladder are locked into place. Ask someone to hold it for you, if possible. Clearly mark and make sure that you can see: Any grab bars or handrails. First and last  steps. Where the edge of each step is. Use tools that help you move around (mobility aids) if they are needed. These include: Canes. Walkers. Scooters. Crutches. Turn on the lights when you go into a dark area. Replace any light bulbs as soon as they burn out. Set up your furniture so you have a clear path. Avoid moving your furniture around. If any of your floors are uneven, fix them. If there are any pets around you, be aware of where they are. Review your medicines with your doctor. Some medicines can make you feel dizzy. This can increase your chance of falling. Ask your doctor what other things that you can do to help prevent falls. This information is not intended to replace advice given to you by your health care provider. Make sure you discuss any questions you have with your health care provider. Document Released: 05/04/2009 Document Revised: 12/14/2015 Document Reviewed: 08/12/2014 Elsevier Interactive Patient Education  2017 [employer].

## 2022-07-17 ENCOUNTER — Telehealth: Payer: Self-pay

## 2022-07-17 ENCOUNTER — Other Ambulatory Visit: Payer: Self-pay | Admitting: Student

## 2022-07-17 ENCOUNTER — Ambulatory Visit (INDEPENDENT_AMBULATORY_CARE_PROVIDER_SITE_OTHER): Payer: Medicare Other | Admitting: Family Medicine

## 2022-07-17 VITALS — Ht 69.0 in | Wt 157.0 lb

## 2022-07-17 DIAGNOSIS — Z0000 Encounter for general adult medical examination without abnormal findings: Secondary | ICD-10-CM

## 2022-07-17 DIAGNOSIS — J849 Interstitial pulmonary disease, unspecified: Secondary | ICD-10-CM

## 2022-07-17 DIAGNOSIS — Z5941 Food insecurity: Secondary | ICD-10-CM

## 2022-07-17 NOTE — Progress Notes (Signed)
I connected with  Timothy [name] on 07/17/22 by a audio enabled telemedicine application and verified that I am speaking with the correct person using two identifiers.  Patient Location: Home  Provider Location: Home Office  I discussed the limitations of evaluation and management by telemedicine. The patient expressed understanding and agreed to proceed. Subjective:   Timothy [name] is a 56 y.o. male who presents for an Initial Medicare Annual Wellness Visit.  Review of Systems           Objective:    Today's Vitals   07/17/22 1257  Weight: 157 lb (71.2 kg)  Height: '5\' 9"'$  (1.753 m)   Body mass index is 23.18 kg/m.     07/17/2022    2:10 PM 07/02/2022   10:06 AM 04/26/2022    4:59 PM 12/31/2021    7:52 AM 10/17/2021   10:42 AM 07/24/2021    1:29 AM 07/19/2021    3:45 PM  Advanced Directives  Does Patient Have a Medical Advance Directive? No No No No No No No  Would patient like information on creating a medical advance directive? No - Patient declined No - Patient declined  No - Patient declined No - Patient declined  No - Patient declined    Current Medications (verified) Outpatient Encounter Medications as of 07/17/2022  Medication Sig   albuterol (PROVENTIL) (2.5 MG/3ML) 0.083% nebulizer solution Take 3 mLs (2.5 mg total) by nebulization every 6 (six) hours as needed for wheezing or shortness of breath.   albuterol (VENTOLIN HFA) 108 (90 Base) MCG/ACT inhaler Inhale 2 puffs into the lungs every 6 (six) hours as needed for wheezing or shortness of breath.   busPIRone (BUSPAR) 15 MG tablet TAKE 1 TABLET BY MOUTH IN THE MORNING.   cyclobenzaprine (FLEXERIL) 10 MG tablet TAKE 1 TABLET BY MOUTH DAILY AS NEEDED FOR MUSCLE SPASM. NEED OFFICE VISIT FOR REFILLS   gabapentin (NEURONTIN) 100 MG capsule TAKE 1 CAPSULE BY MOUTH FOUR TIMES DAILY AS NEEDED   losartan (COZAAR) 100 MG tablet TAKE 1 TABLET BY MOUTH EVERY DAY   ondansetron (ZOFRAN-ODT) 4 MG disintegrating tablet Take 1  tablet (4 mg total) by mouth every 8 (eight) hours as needed for nausea or vomiting.   pantoprazole (PROTONIX) 40 MG tablet TAKE 1 TABLET BY MOUTH DAILY   sertraline (ZOLOFT) 100 MG tablet TAKE 1 TABLET (100 MG TOTAL) BY MOUTH DAILY.   umeclidinium-vilanterol (ANORO ELLIPTA) 62.5-25 MCG/ACT AEPB Inhale 1 puff into the lungs daily.   varenicline (CHANTIX) 0.5 MG tablet Take 1 tablet (0.5 mg total) by mouth 2 (two) times daily with a meal. Once daily for the first week then twice daily   [DISCONTINUED] lurasidone (LATUDA) 40 MG TABS tablet TAKE 1 TABLET(40 MG) BY MOUTH DAILY WITH BREAKFAST   [DISCONTINUED] mometasone-formoterol (DULERA) 100-5 MCG/ACT AERO Inhale 2 puffs into the lungs 2 (two) times daily.   No facility-administered encounter medications on file as of 07/17/2022.    Allergies (verified) Ambien [zolpidem tartrate], Bactroban [mupirocin calcium], Darvocet [propoxyphene n-acetaminophen], Zestril [lisinopril], and Morphine and related   History: Past Medical History:  Diagnosis Date   Acute bronchitis 02/15/2013   Alcohol abuse    Anxiety    Bipolar disorder (Early)    Bronchitis    history   Bronchitis    Cellulitis    - left knee-2009   Chronic pain    Complication of anesthesia    Condyloma 02/04/2012   Depression    Diverticulosis    By colonoscopy  June 2005   Dyspnea    on exertion at times   Elevated CK 09/2011   Emphysema lung ([place])    Emphysema of lung ([place])    Family history of anesthesia complication    Mother N/V   GERD (gastroesophageal reflux disease)    Glaucoma syndrome    does not use eye drops, "They burn".   Headache(784.0)    "alot; not daily" (07/23/2012)   Heavy smoker    Hemorrhoids    History of colostomy 03/21/2012   Left colectomy and anastomosis performed by Autumn Messing, MD on 02/18/13 for recurrent diverticulitis Exploratory laparotomy preformed 02/27/12 for anastomotic leak and Hartman pouch/colostomy peformed Colostomy taken down January  2014   History of diverticulitis of colon 10/16/2011   History of dizziness    History of stomach ulcers 2005   HPV (human papilloma virus) infection    Hypertension    started 3 months ago   Hypoglycemia    Incisional hernia    Lower GI bleed    June 2005. Presumably secondary to diverticulosis.   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   Ventral hernia 01/11/2013   Repaired with mesh 05/20/13   Wrist pain 01/26/2019   Past Surgical History:  Procedure Laterality Date   ABDOMINAL EXPLORATION SURGERY  07/23/2012   w/LOA (07/23/2012)   [place] RESECTION  02/27/2012   Procedure: COLON RESECTION;  Surgeon: Merrie Roof, MD;  Location: [facility];  Service: General;  Laterality: N/A;   COLOSTOMY  02/27/2012   Procedure: COLOSTOMY;  Surgeon: Merrie Roof, MD;  Location: [facility];  Service: General;  Laterality: N/A;   COLOSTOMY TAKEDOWN  07/23/2012   COLOSTOMY TAKEDOWN  07/23/2012   Procedure: COLOSTOMY TAKEDOWN;  Surgeon: Merrie Roof, MD;  Location: [facility];  Service: General;  Laterality: N/A;  Primary Anastomosis   ELBOW FRACTURE SURGERY  ~ 1975   left;  pins inserted    INCISION AND DRAINAGE ABSCESS N/A 04/05/2021   Procedure: INCISION AND DRAINAGE ABDOMINAL WALL ABSCESS;  Surgeon: Jovita Kussmaul, MD;  Location: [facility];  Service: General;  Laterality: N/A;   INSERTION OF MESH N/A 05/20/2013   Procedure: INSERTION OF MESH;  Surgeon: Merrie Roof, MD;  Location: [facility];  Service: General;  Laterality: N/A;   LAPAROSCOPIC LEFT COLON RESECTION  02/19/2012   SIGMOID   LAPAROTOMY  02/27/2012   Procedure: EXPLORATORY LAPAROTOMY;  Surgeon: Merrie Roof, MD;  Location: [facility];  Service: General;  Laterality: N/A;   LAPAROTOMY  07/23/2012   Procedure: EXPLORATORY LAPAROTOMY;  Surgeon: Merrie Roof, MD;  Location: [facility];  Service: General;  Laterality: N/A;   LESION DESTRUCTION  07/23/2012   Procedure:  DESTRUCTION LESION ANUS;  Surgeon: Merrie Roof, MD;  Location: [facility];  Service: General;  Laterality: N/A;  DESTRUCTION ANAL CONDYLOMA   LYSIS OF ADHESION  07/23/2012   Procedure: LYSIS OF ADHESION;  Surgeon: Merrie Roof, MD;  Location: [facility];  Service: General;  Laterality: N/A;   VENTRAL HERNIA REPAIR  05/20/2013   Dr Marlon Pel HERNIA REPAIR N/A 05/20/2013   Procedure: VENTRAL HERNIA REPAIR ;  Surgeon: Merrie Roof, MD;  Location: [facility];  Service: General;  Laterality: N/A;   WART FULGURATION  02/19/2012   Procedure: FULGURATION ANAL WART;  Surgeon: Eddie Dibbles  Jenna Luo, MD;  Location: [facility];  Service: General;  Laterality: N/A;  Destroy  Anal Condyloma    WISDOM TOOTH EXTRACTION  ~ [place] N/A 06/03/2016   Procedure: ABDOMINAL WOUND EXPLORATION AND STITCH REMOVAL;  Surgeon: [name], MD;  Location: [facility];  Service: General;  Laterality: N/A;  ABDOMINAL WOUND EXPLORATION AND STITCH REMOVAL   Family History  Problem Relation Age of Onset   Diabetes Mother    Parkinsonism Mother    Depression Mother    Alcohol abuse Father    Hypertension Father    Anxiety disorder Father    Ulcers Father    Colon polyps Father    Breast cancer Maternal Aunt    Diabetes Maternal Grandmother    Stroke Neg Hx    Heart disease Neg Hx    Colon cancer Neg Hx    Stomach cancer Neg Hx    Social History   Socioeconomic History   Marital status: Legally Separated    Spouse name: Not on file   Number of children: 3   Years of education: Not on file   Highest education level: Not on file  Occupational History   Occupation: unemployed  Tobacco Use   Smoking status: Every Day    Packs/day: 2.00    Years: 34.00    Total pack years: 68.00    Types: Cigarettes    Start date: 07/22/1981   Smokeless tobacco: Former    Types: Chew    Quit date: 12/12/2010   Tobacco comments:    Current 2 PPD smoker - Engineer, 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[employer]  Vaping Use   Vaping Use: Never used   Substance and Sexual Activity   Alcohol use: Yes    Alcohol/week: 36.0 standard drinks of alcohol    Types: 36 Cans of beer per week    Comment: drinks every other day - beer 2-3 per day   Drug use: Yes    Types: Oxycodone, Marijuana    Comment: 3 times a month   Sexual activity: Not on file  Other Topics Concern   Not on file  Social History Narrative   Lives with wife [place], Children Ebb (age 75) Sterling Big (age 86) [name] (age 28).  Unemployed less than high school education.  Updated 10/29/11   Social Determinants of Health   Financial Resource Strain: Medium Risk (07/17/2022)   Overall Financial Resource Strain (CARDIA)    Difficulty of Paying Living Expenses: Somewhat hard  Food Insecurity: Food Insecurity Present (07/17/2022)   Hunger Vital Sign    Worried About Running Out of Food in the Last Year: Sometimes true    Ran Out of Food in the Last Year: Sometimes true  Transportation Needs: No Transportation Needs (07/17/2022)   PRAPARE - Hydrologist (Medical): No    Lack of Transportation (Non-Medical): No  Physical Activity: Inactive (07/17/2022)   Exercise Vital Sign    Days of Exercise per Week: 0 days    Minutes of Exercise per Session: 0 min  Stress: Stress Concern Present (07/17/2022)   [place]    Feeling of Stress : To some extent  Social Connections: Socially Isolated (07/17/2022)   Social Connection and Isolation Panel [NHANES]    Frequency of Communication with Friends and Family: More than three times a week    Frequency of Social Gatherings with Friends and Family: Three times a week  Attends Religious Services: Never    Active Member of Clubs or Organizations: No    Attends Archivist Meetings: Never    Marital Status: Divorced    Tobacco Counseling Ready to quit: Not Answered Counseling given: Not Answered Tobacco comments: Current 2 PPD  smoker - Engineer, 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[employer]   Clinical Intake:  Pre-visit preparation completed: Yes  Pain : No/denies pain     BMI - recorded: 23.18 Nutritional Status: BMI of 19-24  Normal Diabetes: No  How often do you need to have someone help you when you read instructions, pamphlets, or other written materials from your doctor or pharmacy?: 1 - Never What is the last grade level you completed in school?: 12 Diabetic?no  Interpreter Needed?: No Information entered by :: S.Mishael Krysiak, CMA   Activities of Daily Living    07/17/2022    2:11 PM  In your present state of health, do you have any difficulty performing the following activities:  Hearing? 0  Vision? 0  Difficulty concentrating or making decisions? 0  Walking or climbing stairs? 0  Dressing or bathing? 0  Doing errands, shopping? 0  Preparing Food and eating ? N  Using the Toilet? N  In the past six months, have you accidently leaked urine? N  Do you have problems with loss of bowel control? N  Managing your Medications? N  Managing your Finances? N  Housekeeping or managing your Housekeeping? N    Patient Care Team: Gerrit Heck, MD as PCP - General (Family Medicine) Jettie Booze, MD as PCP - Cardiology (Cardiology)  Indicate any recent Medical Services you may have received from other than [facility] providers in the past year (date may be approximate).     Assessment:   This is a routine wellness examination for Thomas.  Hearing/Vision screen Hearing Screening - Comments:: No concerns Vision Screening - Comments:: Does not wear glasses/last exam 2 years ago/Dr Katy Fitch  Dietary issues and exercise activities discussed: Current Exercise Habits: The patient does not participate in regular exercise at present, Exercise limited by: None identified   Goals Addressed   None    Depression Screen    07/17/2022    2:07 PM 07/02/2022   10:07 AM 06/24/2022    9:28 AM 10/17/2021   10:41 AM 07/25/2021   11:19 AM  07/19/2021    3:46 PM 06/13/2021    2:18 PM  PHQ 2/9 Scores  PHQ - 2 Score '1 2 2 2 2 2 1  '$ PHQ- 9 Score  '11 8 9 7 7 7    '$ Fall Risk    07/17/2022    2:10 PM 10/17/2021   10:41 AM 06/09/2020    1:39 PM 07/05/2019    2:01 PM 05/18/2019   10:03 AM  Fall Risk   Falls in the past year? 1 0 0 0 0  Number falls in past yr: 1 0 0 0 0  Injury with Fall?  0 0    Follow up     Falls evaluation completed    FALL RISK PREVENTION PERTAINING TO THE HOME:  Any stairs in or around the home? Yes  If so, are there any without handrails? Yes  Home free of loose throw rugs in walkways, pet beds, electrical cords, etc? Yes  Adequate lighting in your home to reduce risk of falls? Yes   ASSISTIVE DEVICES UTILIZED TO PREVENT FALLS:  Life alert? No  Use of a cane, walker or w/c? No  Grab bars in the bathroom?  Yes  Shower chair or bench in shower? No  Elevated toilet seat or a handicapped toilet? No  Cognitive Function:       Immunizations Immunization History  Administered Date(s) Administered   Pneumococcal Polysaccharide-23 10/29/2011    TDAP status: Due, Education has been provided regarding the importance of this vaccine. Advised may receive this vaccine at local pharmacy or Health Dept. Aware to provide a copy of the vaccination record if obtained from local pharmacy or Health Dept. Verbalized acceptance and understanding.  Flu Vaccine status: Declined, Education has been provided regarding the importance of this vaccine but patient still declined. Advised may receive this vaccine at local pharmacy or Health Dept. Aware to provide a copy of the vaccination record if obtained from local pharmacy or Health Dept. Verbalized acceptance and understanding.    Covid-19 vaccine status: Declined, Education has been provided regarding the importance of this vaccine but patient still declined. Advised may receive this vaccine at local pharmacy or Health Dept.or vaccine clinic. Aware to provide a  copy of the vaccination record if obtained from local pharmacy or Health Dept. Verbalized acceptance and understanding.  Qualifies for Shingles Vaccine? Yes   Zostavax completed No   Shingrix Completed?: No.    Education has been provided regarding the importance of this vaccine. Patient has been advised to call insurance company to determine out of pocket expense if they have not yet received this vaccine. Advised may also receive vaccine at local pharmacy or Health Dept. Verbalized acceptance and understanding.  Screening Tests Health Maintenance  Topic Date Due   Medicare Annual Wellness (AWV)  Never done   COLONOSCOPY (Pts 45-93yr Insurance coverage will need to be confirmed)  12/23/2021   Zoster Vaccines- Shingrix (1 of 2) 10/16/2022 (Originally 06/26/2018)   INFLUENZA VACCINE  10/20/2022 (Originally 02/19/2022)   COVID-19 Vaccine (1) 10/20/2022 (Originally 12/25/1966)   Lung Cancer Screening  07/12/2023   Hepatitis C Screening  Completed   HIV Screening  Completed   HPV VACCINES  Aged Out   DTaP/Tdap/Td  Discontinued    Health Maintenance  Health Maintenance Due  Topic Date Due   Medicare Annual Wellness (AWV)  Never done   COLONOSCOPY (Pts 45-439yrInsurance coverage will need to be confirmed)  12/23/2021    Colorectal cancer screening: Referral to GI placed scheduled. Pt aware the office will call re: appt.  Lung Cancer Screening: (Low Dose CT Chest recommended if Age 54-80ears, 30 pack-year currently smoking OR have quit w/in 15years.) does qualify.   Lung Cancer Screening Referral: done  Additional Screening:  Hepatitis C Screening: does qualify; Completed 08/14/2015  Vision Screening: Recommended annual ophthalmology exams for early detection of glaucoma and other disorders of the eye. Is the patient up to date with their annual eye exam?  Yes  Who is the provider or what is the name of the office in which the patient attends annual eye exams? [facility] If pt is not  established with a provider, would they like to be referred to a provider to establish care? No .   Dental Screening: Recommended annual dental exams for proper oral hygiene  Community Resource Referral / Chronic Care Management: CRR required this visit?  Yes   CCM required this visit?  No      Plan:     I have personally reviewed and noted the following in the patient's chart:   Medical and social history Use of alcohol, tobacco or illicit drugs  Current medications and supplements including opioid  prescriptions. Patient is not currently taking opioid prescriptions. Functional ability and status Nutritional status Physical activity Advanced directives List of other physicians Hospitalizations, surgeries, and ER visits in previous 12 months Vitals Screenings to include cognitive, depression, and falls Referrals and appointments  In addition, I have reviewed and discussed with patient certain preventive protocols, quality metrics, and best practice recommendations. A written personalized care plan for preventive services as well as general preventive health recommendations were provided to patient.     Amado Coe, [place]   07/17/2022

## 2022-07-17 NOTE — Progress Notes (Signed)
Patient and confirmed date of birth.  Discussed that CT findings had showed interstitial lung disease and I would recommend pulmonology follow-up for better medication management and symptomatic management.  Amenable, pulmonology referral placed.

## 2022-07-17 NOTE — Telephone Encounter (Signed)
During patient annual medicare wellness visit, patient admits to having trouble purchasing food and paying utilities.

## 2022-07-18 ENCOUNTER — Telehealth: Payer: Self-pay | Admitting: *Deleted

## 2022-07-18 NOTE — Progress Notes (Signed)
  Care Coordination  Outreach Note  07/18/2022 Name: Timothy [name] MRN: [id number] DOB: Mar 02, 1966   Care Coordination Outreach Attempts: An unsuccessful telephone outreach was attempted today to offer the patient information about available care coordination services as a benefit of their health plan.   Follow Up Plan:  Additional outreach attempts will be made to offer the patient care coordination information and services.   Encounter Outcome:  No Answer  [place]  Direct Dial: [phone]

## 2022-07-19 ENCOUNTER — Other Ambulatory Visit: Payer: Self-pay | Admitting: Student

## 2022-07-19 ENCOUNTER — Other Ambulatory Visit: Payer: Self-pay | Admitting: Family Medicine

## 2022-07-19 DIAGNOSIS — I10 Essential (primary) hypertension: Secondary | ICD-10-CM

## 2022-07-20 ENCOUNTER — Other Ambulatory Visit: Payer: Self-pay | Admitting: Family Medicine

## 2022-07-20 DIAGNOSIS — I10 Essential (primary) hypertension: Secondary | ICD-10-CM

## 2022-07-20 MED ORDER — LOSARTAN POTASSIUM 100 MG PO TABS: 100.0000 mg | ORAL_TABLET | Freq: Every day | ORAL | 0 refills | Status: DC

## 2022-07-20 NOTE — Progress Notes (Signed)
Patient calls after hours emergency line requesting refill of medications.  States he is completely out of his blood pressure medication and just had a clinic visit last week and requested refills but they were not refilled.  Will send in for losartan 100 mg daily.  States that is only when he is completely out of at this time.  Advised to call the clinic on Tuesday when they open to request additional medication if needed.

## 2022-07-23 ENCOUNTER — Other Ambulatory Visit: Payer: Self-pay | Admitting: Student

## 2022-07-23 DIAGNOSIS — F410 Panic disorder [episodic paroxysmal anxiety] without agoraphobia: Secondary | ICD-10-CM

## 2022-07-24 NOTE — Progress Notes (Signed)
  Care Coordination   Note   07/24/2022 Name: Timothy [name] MRN: [id number] DOB: 1965/10/24  [name] is a 57 y.o. year old male who sees Gerrit Heck, MD for primary care. I reached out to [name] by phone today to offer care coordination services.  [name] was given information about Care Coordination services today including:   The Care Coordination services include support from the care team which includes your Nurse Coordinator, Clinical Social Worker, or Pharmacist.  The Care Coordination team is here to help remove barriers to the health concerns and goals most important to you. Care Coordination services are voluntary, and the patient may decline or stop services at any time by request to their care team member.   Care Coordination Consent Status: Patient agreed to services and verbal consent obtained.   Follow up plan:  Telephone appointment with care coordination team member scheduled for:  07/31/22  Encounter Outcome:  Pt. Scheduled  [place]  Direct Dial: [phone]

## 2022-07-30 ENCOUNTER — Ambulatory Visit: Payer: Medicare Other | Admitting: Pharmacist

## 2022-07-31 ENCOUNTER — Ambulatory Visit: Payer: Self-pay

## 2022-07-31 NOTE — Patient Instructions (Signed)
Visit Information  Thank you for taking time to visit with me today. Please don't hesitate to contact me if I can be of assistance to you.   Following are the goals we discussed today:   Goals Addressed             This Visit's Progress    COMPLETED: Care Coordination Activities       Care Coordination Interventions: Referral received indicating patient is in need of assistance with food insecurity and financial hardship Discussed patient is having difficulty affording food, used to have assistance from [employer] but was advised he is now over the income limit Determined patient has Medicare and Medicaid Education provided on Managed Medicaid benefits that offer assistance with food and utilities; patient to contact DSS to determine if he is eligible for Managed Medicaid  Patient reports he inherited the home from his mother and the home and utility bill continues to be in his mothers name. Patient is behind on payments for both Reviewed that resources that assist with payments usually require bills to be in patients name - encouraged patient to contact DSS to inquire if they can assist without the bills being in his name Education provided on the free phone app called "[place]" for patient to identify food pantry locations Encouraged patient to contact SW as needed         If you are experiencing a Mental Health or [place] or need someone to talk to, please call 911  The patient verbalized understanding of instructions, educational materials, and care plan provided today and DECLINED offer to receive copy of patient instructions, educational materials, and care plan.   No further follow up required: Please contact your primary care provider as needed.  Daneen Schick, BSW, CDP Social Worker, Certified Dementia Practitioner [facility]  Care Coordination [phone]

## 2022-07-31 NOTE — Patient Outreach (Signed)
  Care Coordination   Initial Visit Note   07/31/2022 Name: Timothy [name] MRN: [id number] DOB: 04-09-1966  Timothy [name] is a 57 y.o. year old male who sees Gerrit Heck, MD for primary care. I spoke with  [name] by phone today.  What matters to the patients health and wellness today?  Resource education    Goals Addressed             This Visit's Progress    COMPLETED: Care Coordination Activities       Care Coordination Interventions: Referral received indicating patient is in need of assistance with food insecurity and financial hardship Discussed patient is having difficulty affording food, used to have assistance from [employer] but was advised he is now over the income limit Determined patient has Medicare and Medicaid Education provided on Managed Medicaid benefits that offer assistance with food and utilities; patient to contact DSS to determine if he is eligible for Managed Medicaid  Patient reports he inherited the home from his mother and the home and utility bill continues to be in his mothers name. Patient is behind on payments for both Reviewed that resources that assist with payments usually require bills to be in patients name - encouraged patient to contact DSS to inquire if they can assist without the bills being in his name Education provided on the free phone app called "[place]asis" for patient to identify food pantry locations Encouraged patient to contact SW as needed         SDOH assessments and interventions completed:  Yes  SDOH Interventions Today    Flowsheet Row Most Recent Value  SDOH Interventions   Food Insecurity Interventions Other (Comment)  [Education on [place] Interventions Intervention Not Indicated  Transportation Interventions Intervention Not Indicated  Utilities Interventions Other (Comment)  [advised to contact DSS]        Care Coordination  Interventions:  Yes, provided   Follow up plan: No further intervention required.   Encounter Outcome:  Pt. Visit Completed   Daneen Schick, BSW, CDP Social Worker, Certified Dementia Practitioner [facility]  Care Coordination [phone]

## 2022-08-07 ENCOUNTER — Ambulatory Visit: Payer: Medicare Other | Admitting: Pharmacist

## 2022-08-08 ENCOUNTER — Encounter: Payer: Self-pay | Admitting: Family Medicine

## 2022-08-08 ENCOUNTER — Ambulatory Visit (INDEPENDENT_AMBULATORY_CARE_PROVIDER_SITE_OTHER): Payer: Medicare Other | Admitting: Family Medicine

## 2022-08-08 VITALS — BP 151/99 | HR 98 | Temp 97.9°F | Ht 69.0 in | Wt 163.1 lb

## 2022-08-08 DIAGNOSIS — I10 Essential (primary) hypertension: Secondary | ICD-10-CM

## 2022-08-08 DIAGNOSIS — J441 Chronic obstructive pulmonary disease with (acute) exacerbation: Secondary | ICD-10-CM

## 2022-08-08 MED ORDER — DOXYCYCLINE HYCLATE 100 MG PO TABS: 100.0000 mg | ORAL_TABLET | Freq: Two times a day (BID) | ORAL | 0 refills | Status: AC

## 2022-08-08 MED ORDER — PREDNISONE 20 MG PO TABS: 40.0000 mg | ORAL_TABLET | Freq: Every day | ORAL | 0 refills | Status: AC

## 2022-08-08 NOTE — Assessment & Plan Note (Signed)
BP elevated in office at 151/99, currently presenting with illness, which is likely contributing to elevation.  - Recommended patient check BP regularly at home once over acute illness - PCP follow-up

## 2022-08-08 NOTE — Progress Notes (Signed)
    SUBJECTIVE:   CHIEF COMPLAINT / HPI:   Sickness  - Roommates diagnosed with RSV - Has been feeling unwell for the last 9 days - Slept for 3 days straight, had some improvement on Sunday and then started getting worse again - Feels like his breathing is worse and is wheezing - Gets dizzy when he is having to work to breathe too much  - Sore throat, congestion, right ear pain - Has been using his albuterol inhaler about 4-5 times per day with some improvement  PERTINENT  PMH / PSH: COPD  OBJECTIVE:   BP (!) 151/99   Pulse 98   Temp 97.9 F (36.6 C) (Oral)   Ht '5\' 9"'$  (1.753 m)   Wt 163 lb 2 oz (74 kg)   SpO2 100%   BMI 24.09 kg/m   Gen: well-appearing, NAD CV: RRR, no m/r/g appreciated, no peripheral edema Pulm: diffuse wheezing, no focality on exam and no consolidations, mildly increased WOB  GI: soft, non-distended, slight area of bulging present in the LLQ/suprapubic region that is TTP   ASSESSMENT/PLAN:   Essential hypertension, benign BP elevated in office at 151/99, currently presenting with illness, which is likely contributing to elevation.  - Recommended patient check BP regularly at home once over acute illness - PCP follow-up  COPD exacerbation (HCC) Given continued wheezing and worsening cough, feel that symptoms consistent with COPD exacerbation and will treat with steroids and antibiotics. Reassuringly is maintaining saturations appropriately. - Doxycyline '100mg'$  BID x5 days - Prednisone '40mg'$  daily x5 days - Strict ER precautions for the weekend - Follow-up if not clinically improving by Monday     Timothy [name], [place]

## 2022-08-08 NOTE — Assessment & Plan Note (Signed)
Given continued wheezing and worsening cough, feel that symptoms consistent with COPD exacerbation and will treat with steroids and antibiotics. Reassuringly is maintaining saturations appropriately. - Doxycyline '100mg'$  BID x5 days - Prednisone '40mg'$  daily x5 days - Strict ER precautions for the weekend - Follow-up if not clinically improving by Monday

## 2022-08-08 NOTE — Patient Instructions (Signed)
Given the you have had worsening of your symptoms and they have been going on for over a week at this point I want to go ahead and treat you for a COPD exacerbation with steroids and antibiotics.  You can take doxycycline twice a day for the next 5 days, please make sure to take with food as it can be very strong/hard on the stomach.  I have also sent in for prednisone 40 mg (2 tablets) to be taken for the next 5 days as well.  If you are not noticing any improvement by Monday, then let our office know close will need to reevaluate you.  If you need any refills on your medications, just contact the pharmacy so they can send over prescription request to your PCP.

## 2022-08-09 ENCOUNTER — Ambulatory Visit: Payer: Medicare Other | Admitting: Physician Assistant

## 2022-08-12 ENCOUNTER — Ambulatory Visit: Payer: Self-pay | Admitting: General Surgery

## 2022-08-13 ENCOUNTER — Other Ambulatory Visit (HOSPITAL_COMMUNITY): Payer: Self-pay | Admitting: General Surgery

## 2022-08-13 DIAGNOSIS — L02211 Cutaneous abscess of abdominal wall: Secondary | ICD-10-CM

## 2022-08-14 ENCOUNTER — Ambulatory Visit (HOSPITAL_BASED_OUTPATIENT_CLINIC_OR_DEPARTMENT_OTHER)
Admission: RE | Admit: 2022-08-14 | Discharge: 2022-08-14 | Disposition: A | Discharge status: Home / Self Care | Payer: Medicare Other | Source: Ambulatory Visit | Attending: General Surgery | Admitting: General Surgery

## 2022-08-14 DIAGNOSIS — L02211 Cutaneous abscess of abdominal wall: Secondary | ICD-10-CM | POA: Diagnosis not present

## 2022-08-14 LAB — POCT I-STAT CREATININE: Creatinine, Ser: 0.6 mg/dL — ABNORMAL LOW (ref 0.61–1.24)

## 2022-08-14 MED ORDER — IOHEXOL 300 MG/ML  SOLN
100.0000 mL | Freq: Once | INTRAMUSCULAR | Status: AC | PRN
  Administered 2022-08-14: 75 mL via INTRAVENOUS

## 2022-08-15 ENCOUNTER — Institutional Professional Consult (permissible substitution): Payer: Medicare Other | Admitting: Pulmonary Disease

## 2022-08-23 NOTE — Pre-Procedure Instructions (Signed)
Surgical Instructions    Your procedure is scheduled on Thursday 08/29/22.   Report to [facility] Main Entrance "A" at 06:30 A.M., then check in with the Admitting office.  Call this number if you have problems the morning of surgery:  [phone]   If you have any questions prior to your surgery date call [phone]: Open Monday-Friday 8am-4pm If you experience any cold or flu symptoms such as cough, fever, chills, shortness of breath, etc. between now and your scheduled surgery, please notify us at the above number     Remember:  Do not eat after midnight the night before your surgery  You may drink clear liquids until 05:30 A.M. the morning of your surgery.   Clear liquids allowed are: Water, Non-Citrus Juices (without pulp), Carbonated Beverages, Clear Tea, Black Coffee ONLY (NO MILK, CREAM OR POWDERED CREAMER of any kind), and Gatorade    Take these medicines the morning of surgery with A SIP OF WATER:   busPIRone (BUSPAR)   gabapentin (NEURONTIN)   pantoprazole (PROTONIX)   sertraline (ZOLOFT)    Take these medicines if needed:   albuterol (PROVENTIL) (2.5 MG/3ML)   albuterol (VENTOLIN HFA)   ondansetron (ZOFRAN-ODT)   umeclidinium-vilanterol (ANORO ELLIPTA)    As of today, STOP taking any Aspirin (unless otherwise instructed by your surgeon) Aleve, Naproxen, Ibuprofen, Motrin, Advil, Goody's, BC's, all herbal medications, fish oil, and all vitamins.           Do not wear jewelry or makeup. Do not wear lotions, powders, perfumes/cologne or deodorant. Do not shave 48 hours prior to surgery.  Men may shave face and neck. Do not bring valuables to the hospital. Do not wear nail polish, gel polish, artificial nails, or any other type of covering on natural nails (fingers and toes) If you have artificial nails or gel coating that need to be removed by a nail salon, please have this removed prior to surgery. Artificial nails or gel coating may interfere with anesthesia's  ability to adequately monitor your vital signs.  [place] is not responsible for any belongings or valuables.    Do NOT Smoke (Tobacco/Vaping)  24 hours prior to your procedure  If you use a CPAP at night, you may bring your mask for your overnight stay.   Contacts, glasses, hearing aids, dentures or partials may not be worn into surgery, please bring cases for these belongings   For patients admitted to the hospital, discharge time will be determined by your treatment team.   Patients discharged the day of surgery will not be allowed to drive home, and someone needs to stay with them for 24 hours.   SURGICAL WAITING ROOM VISITATION Patients having surgery or a procedure may have no more than 2 support people in the waiting area - these visitors may rotate.   Children under the age of 75 must have an adult with them who is not the patient. If the patient needs to stay at the hospital during part of their recovery, the visitor guidelines for inpatient rooms apply. Pre-op nurse will coordinate an appropriate time for 1 support person to accompany patient in pre-op.  This support person may not rotate.   Please refer to [email or web address] for the visitor guidelines for Inpatients (after your surgery is over and you are in a regular room).    Special instructions:    Oral Hygiene is also important to reduce your risk of infection.  Remember - BRUSH YOUR TEETH THE MORNING OF SURGERY WITH  [place]- Preparing For Surgery  Before surgery, you can play an important role. Because skin is not sterile, your skin needs to be as free of germs as possible. You can reduce the number of germs on your skin by washing with CHG (chlorahexidine gluconate) Soap before surgery.  CHG is an antiseptic cleaner which kills germs and bonds with the skin to continue killing germs even after washing.     Please do not use if you  have an allergy to CHG or antibacterial soaps. If your skin becomes reddened/irritated stop using the CHG.  Do not shave (including legs and underarms) for at least 48 hours prior to first CHG shower. It is OK to shave your face.  Please follow these instructions carefully.     Shower the NIGHT BEFORE SURGERY and the MORNING OF SURGERY with CHG Soap.   If you chose to wash your hair, wash your hair first as usual with your normal shampoo. After you shampoo, rinse your hair and body thoroughly to remove the shampoo.  Then [employer] and genitals (private parts) with your normal soap and rinse thoroughly to remove soap.  After that Use CHG Soap as you would any other liquid soap. You can apply CHG directly to the skin and wash gently with a scrungie or a clean washcloth.   Apply the CHG Soap to your body ONLY FROM THE NECK DOWN.  Do not use on open wounds or open sores. Avoid contact with your eyes, ears, mouth and genitals (private parts). Wash Face and genitals (private parts)  with your normal soap.   Wash thoroughly, paying special attention to the area where your surgery will be performed.  Thoroughly rinse your body with warm water from the neck down.  DO NOT shower/wash with your normal soap after using and rinsing off the CHG Soap.  Pat yourself dry with a CLEAN TOWEL.  Wear CLEAN PAJAMAS to bed the night before surgery  Place CLEAN SHEETS on your bed the night before your surgery  DO NOT SLEEP WITH PETS.   Day of Surgery:  Take a shower with CHG soap. Wear Clean/Comfortable clothing the morning of surgery Do not apply any deodorants/lotions.   Remember to brush your teeth WITH YOUR REGULAR TOOTHPASTE.    If you received a COVID test during your pre-op visit, it is requested that you wear a mask when out in public, stay away from anyone that may not be feeling well, and notify your surgeon if you develop symptoms. If you have been in contact with anyone that has tested  positive in the last 10 days, please notify your surgeon.    Please read over the following fact sheets that you were given.

## 2022-08-26 ENCOUNTER — Inpatient Hospital Stay (HOSPITAL_COMMUNITY)
Admission: RE | Admit: 2022-08-26 | Discharge: 2022-08-26 | Disposition: A | Discharge status: Home / Self Care | Payer: Medicare Other | Source: Ambulatory Visit

## 2022-08-26 NOTE — Pre-Procedure Instructions (Incomplete)
Surgical Instructions    Your procedure is scheduled on Thursday 08/29/22.   Report to [facility] Main Entrance "A" at 06:30 A.M., then check in with the Admitting office.  Call this number if you have problems the morning of surgery:  [phone]   If you have any questions prior to your surgery date call [phone]: Open Monday-Friday 8am-4pm If you experience any cold or flu symptoms such as cough, fever, chills, shortness of breath, etc. between now and your scheduled surgery, please notify us at the above number     Remember:  Do not eat after midnight the night before your surgery  You may drink clear liquids until 05:30 A.M. the morning of your surgery.   Clear liquids allowed are: Water, Non-Citrus Juices (without pulp), Carbonated Beverages, Clear Tea, Black Coffee ONLY (NO MILK, CREAM OR POWDERED CREAMER of any kind), and Gatorade    Take these medicines the morning of surgery with A SIP OF WATER:   busPIRone (BUSPAR)   pantoprazole (PROTONIX)   sertraline (ZOLOFT)  umeclidinium-vilanterol (ANORO ELLIPTA)    Take these medicines if needed:   albuterol (PROVENTIL) (2.5 MG/3ML)   albuterol (VENTOLIN HFA)   ondansetron (ZOFRAN-ODT)  gabapentin (NEURONTIN)      As of today, STOP taking any Aspirin (unless otherwise instructed by your surgeon) Aleve, Naproxen, Ibuprofen, Motrin, Advil, Goody's, BC's, all herbal medications, fish oil, and all vitamins.    [place] is not responsible for any belongings or valuables.    Do NOT Smoke (Tobacco/Vaping)  24 hours prior to your procedure  If you use a CPAP at night, you may bring your mask for your overnight stay.   Contacts, glasses, hearing aids, dentures or partials may not be worn into surgery, please bring cases for these belongings   For patients admitted to the hospital, discharge time will be determined by your treatment team.   Patients discharged the day of surgery will not be allowed to drive home, and  someone needs to stay with them for 24 hours.   SURGICAL WAITING ROOM VISITATION Patients having surgery or a procedure may have no more than 2 support people in the waiting area - these visitors may rotate.   Children under the age of 35 must have an adult with them who is not the patient. If the patient needs to stay at the hospital during part of their recovery, the visitor guidelines for inpatient rooms apply. Pre-op nurse will coordinate an appropriate time for 1 support person to accompany patient in pre-op.  This support person may not rotate.   Please refer to [email or web address] for the visitor guidelines for Inpatients (after your surgery is over and you are in a regular room).    Special instructions:    Oral Hygiene is also important to reduce your risk of infection.  Remember - BRUSH YOUR TEETH THE MORNING OF SURGERY WITH YOUR REGULAR TOOTHPASTE   [place]- Preparing For Surgery  Before surgery, you can play an important role. Because skin is not sterile, your skin needs to be as free of germs as possible. You can reduce the number of germs on your skin by washing with CHG (chlorahexidine gluconate) Soap before surgery.  CHG is an antiseptic cleaner which kills germs and bonds with the skin to continue killing germs even after washing.     Please do not use if you have an allergy to CHG or antibacterial soaps. If your skin becomes reddened/irritated stop using the CHG.  Do not  shave (including legs and underarms) for at least 48 hours prior to first CHG shower. It is OK to shave your face.  Please follow these instructions carefully.     Shower the NIGHT BEFORE SURGERY and the MORNING OF SURGERY with CHG Soap.   If you chose to wash your hair, wash your hair first as usual with your normal shampoo. After you shampoo, rinse your hair and body thoroughly to remove the shampoo.  Then [employer] and genitals (private parts)  with your normal soap and rinse thoroughly to remove soap.  After that Use CHG Soap as you would any other liquid soap. You can apply CHG directly to the skin and wash gently with a scrungie or a clean washcloth.   Apply the CHG Soap to your body ONLY FROM THE NECK DOWN.  Do not use on open wounds or open sores. Avoid contact with your eyes, ears, mouth and genitals (private parts). Wash Face and genitals (private parts)  with your normal soap.   Wash thoroughly, paying special attention to the area where your surgery will be performed.  Thoroughly rinse your body with warm water from the neck down.  DO NOT shower/wash with your normal soap after using and rinsing off the CHG Soap.  Pat yourself dry with a CLEAN TOWEL.  Wear CLEAN PAJAMAS to bed the night before surgery  Place CLEAN SHEETS on your bed the night before your surgery  DO NOT SLEEP WITH PETS.   Day of Surgery:  Take a shower with CHG soap. Wear Clean/Comfortable clothing the morning of surgery Do not wear jewelry or makeup. Do not wear lotions, powders, perfumes/cologne or deodorant. Do not shave 48 hours prior to surgery.  Men may shave face and neck. Do not bring valuables to the hospital. Do not wear nail polish, gel polish, artificial nails, or any other type of covering on natural nails (fingers and toes) If you have artificial nails or gel coating that need to be removed by a nail salon, please have this removed prior to surgery. Artificial nails or gel coating may interfere with anesthesia's ability to adequately monitor your vital signs. Remember to brush your teeth WITH YOUR REGULAR TOOTHPASTE.    If you received a COVID test during your pre-op visit, it is requested that you wear a mask when out in public, stay away from anyone that may not be feeling well, and notify your surgeon if you develop symptoms. If you have been in contact with anyone that has tested positive in the last 10 days, please notify your  surgeon.    Please read over the following fact sheets that you were given.

## 2022-08-28 ENCOUNTER — Ambulatory Visit: Payer: Medicare Other | Admitting: Physician Assistant

## 2022-08-28 ENCOUNTER — Inpatient Hospital Stay (HOSPITAL_COMMUNITY): Admission: RE | Admit: 2022-08-28 | Payer: Medicare Other | Source: Ambulatory Visit

## 2022-08-29 ENCOUNTER — Other Ambulatory Visit: Payer: Self-pay | Admitting: Student

## 2022-08-29 DIAGNOSIS — F339 Major depressive disorder, recurrent, unspecified: Secondary | ICD-10-CM

## 2022-09-02 ENCOUNTER — Encounter: Payer: Self-pay | Admitting: Student

## 2022-09-02 ENCOUNTER — Ambulatory Visit (INDEPENDENT_AMBULATORY_CARE_PROVIDER_SITE_OTHER): Payer: Medicare Other | Admitting: Student

## 2022-09-02 VITALS — BP 116/60 | HR 77 | Wt 160.2 lb

## 2022-09-02 DIAGNOSIS — J449 Chronic obstructive pulmonary disease, unspecified: Secondary | ICD-10-CM

## 2022-09-02 DIAGNOSIS — J441 Chronic obstructive pulmonary disease with (acute) exacerbation: Secondary | ICD-10-CM

## 2022-09-02 MED ORDER — BENZONATATE 100 MG PO CAPS: 100.0000 mg | ORAL_CAPSULE | Freq: Three times a day (TID) | ORAL | 0 refills | Status: DC | PRN

## 2022-09-02 MED ORDER — PREDNISONE 20 MG PO TABS: 40.0000 mg | ORAL_TABLET | Freq: Every day | ORAL | 0 refills | Status: AC

## 2022-09-02 MED ORDER — ANORO ELLIPTA 62.5-25 MCG/ACT IN AEPB: 1.0000 | INHALATION_SPRAY | Freq: Every day | RESPIRATORY_TRACT | 6 refills | Status: DC

## 2022-09-02 MED ORDER — AZITHROMYCIN 500 MG PO TABS: 500.0000 mg | ORAL_TABLET | Freq: Every day | ORAL | 0 refills | Status: AC

## 2022-09-02 NOTE — Patient Instructions (Addendum)
It was great to see you! Thank you for allowing me to participate in your care!   I recommend that you always bring your medications to each appointment as this makes it easy to ensure we are on the correct medications and helps [place] not miss when refills are needed.  Our plans for today:  - I am refilling your anoro ellipta - I am sending in prednisone for 5 day and azithromycin to take for 3 days -I recommend following up with pulmonologist that I referred you to in the past -I am getting get a chest x-ray to make sure you do not have any pneumonia -Please return to care if having chest pain, coughing up blood, worsening fevers or shortness of breath  Take care and seek immediate care sooner if you develop any concerns. Please remember to show up 15 minutes before your scheduled appointment time!  Gerrit Heck, MD [place]

## 2022-09-02 NOTE — Progress Notes (Unsigned)
    SUBJECTIVE:   CHIEF COMPLAINT / HPI: Productive cough  Seen on 1/18 for presumed COPD exacerbation and prescribed doxycycline 100 mg BID x 5 days and prednisone 40 mg daily for 5 days. Since then had felt better and was scheduled for abdominal surgery on the 8th of this month but saw surgeon earlier this month in clinic and his surgeon had [place].  Since seeing his surgeon he has felt worse.  Chest hurts when coughing. Coughing up dark brown stuff some green. No blood that he has seen. Felt nauseous.  Tactile fever 2 Saturdays ago. Today is the 9th day of symptoms. Has done nothing but sleep.  Has been using his albuterol more often 4-5 times a day. Not smoking as much. Can't get a refill of anoro ellipta from pharmacy-last time he took it was thurday of last. Feels the same as the beginning of his illness and has not gotten better or worse.   Feels warm in evening. Lays flat at night does not have any issues with this.  No leg swelling.  Does have some pain when taking deep breaths in. Feels like he can't get stuff out.   Cough for last 9 days, brown phlegm-chest bothering from coughing so much  PERTINENT  PMH / PSH: COPD  OBJECTIVE:   BP 116/60   Pulse 77   Wt 160 lb 3.2 oz (72.7 kg)   SpO2 96%   BMI 23.66 kg/m   General: Ill-appearing but nontoxic, awake, alert, responsive to questions Head: Normocephalic atraumatic, moist mucous membranes, no oropharyngeal exudates, some nasal congestion present CV: Regular rate and rhythm no murmurs rubs or gallops Respiratory: Expiratory wheezes throughout anterior and posterior lung fields, no rales or crackles, chest rises symmetrically,  no increased work of breathing, productive sounding cough Extremities: Moves upper and lower extremities freely, no edema in LE ASSESSMENT/PLAN:   COPD exacerbation ([place]) Believe patient likely has COVID he contracted outpatient.  He is too far out to be treated with an antiviral so will not test today.   He does have symptoms of COPD exacerbation with increased sputum production and cough.  Expiratory wheezes throughout lung fields on examination.  Saturating well on room air still.  No increased work of breathing.  Does not have tachycardia or hemoptysis so I do not think this is a pulmonary embolism.  Recently got a CT scan in December which showed interstitial lung disease.  I discussed that pulmonology follow-up is something that I would recommend for him.  Given tactile fevers and length of symptoms will obtain chest x-ray to rule out pneumonia. -Prednisone 40 mg for 5 days -Azithromycin 500 mg for 3 days -Chest x-ray -Refill on Anoro Ellipta -Tessalon Perles as needed   Gerrit Heck, MD [place]

## 2022-09-03 NOTE — Assessment & Plan Note (Signed)
Believe patient likely has COVID he contracted outpatient.  He is too far out to be treated with an antiviral so will not test today.  He does have symptoms of COPD exacerbation with increased sputum production and cough.  Expiratory wheezes throughout lung fields on examination.  Saturating well on room air still.  No increased work of breathing.  Does not have tachycardia or hemoptysis so I do not think this is a pulmonary embolism.  Recently got a CT scan in December which showed interstitial lung disease.  I discussed that pulmonology follow-up is something that I would recommend for him.  Given tactile fevers and length of symptoms will obtain chest x-ray to rule out pneumonia. -Prednisone 40 mg for 5 days -Azithromycin 500 mg for 3 days -Chest x-ray -Refill on Anoro Ellipta -Tessalon Perles as needed

## 2022-09-23 ENCOUNTER — Other Ambulatory Visit: Payer: Self-pay | Admitting: Student

## 2022-09-23 ENCOUNTER — Other Ambulatory Visit: Payer: Self-pay | Admitting: Family Medicine

## 2022-09-23 DIAGNOSIS — R1084 Generalized abdominal pain: Secondary | ICD-10-CM

## 2022-09-23 DIAGNOSIS — R112 Nausea with vomiting, unspecified: Secondary | ICD-10-CM

## 2022-09-23 DIAGNOSIS — F410 Panic disorder [episodic paroxysmal anxiety] without agoraphobia: Secondary | ICD-10-CM

## 2022-09-23 DIAGNOSIS — I10 Essential (primary) hypertension: Secondary | ICD-10-CM

## 2022-09-23 DIAGNOSIS — K219 Gastro-esophageal reflux disease without esophagitis: Secondary | ICD-10-CM

## 2022-09-30 NOTE — Progress Notes (Signed)
Surgical Instructions    Your procedure is scheduled on Friday, 10/04/22.  Report to [facility] Main Entrance "A" at 5:30 A.M., then check in with the Admitting office.  Call this number if you have problems the morning of surgery:  [phone]   If you have any questions prior to your surgery date call [phone]: Open Monday-Friday 8am-4pm If you experience any cold or flu symptoms such as cough, fever, chills, shortness of breath, etc. between now and your scheduled surgery, please notify us at the above number     Remember:  Do not eat after midnight the night before your surgery  You may drink clear liquids until 4:30am the morning of your surgery.   Clear liquids allowed are: Water, Non-Citrus Juices (without pulp), Carbonated Beverages, Clear Tea, Black Coffee ONLY (NO MILK, CREAM OR POWDERED CREAMER of any kind), and Gatorade    Take these medicines the morning of surgery with A SIP OF WATER:  busPIRone (BUSPAR)  pantoprazole (PROTONIX)  sertraline (ZOLOFT)  umeclidinium-vilanterol (ANORO ELLIPTA)   IF NEEDED: albuterol (PROVENTIL) (2.5 MG/3ML) nebulizer and/or inhaler- bring inhaler with you the day of surgery benzonatate (TESSALON PERLES)  cyclobenzaprine (FLEXERIL)  gabapentin (NEURONTIN)  ondansetron (ZOFRAN-ODT)   As of today, STOP taking any Aspirin (unless otherwise instructed by your surgeon) Aleve, Naproxen, Ibuprofen, Motrin, Advil, Goody's, BC's, all herbal medications, fish oil, and all vitamins.           Do not wear jewelry or makeup. Do not wear lotions, powders, cologne or deodorant. Men may shave face and neck. Do not bring valuables to the hospital. Do not wear nail polish, gel polish, artificial nails, or any other type of covering on natural nails (fingers and toes) If you have artificial nails or gel coating that need to be removed by a nail salon, please have this removed prior to surgery. Artificial nails or gel coating may interfere with  anesthesia's ability to adequately monitor your vital signs.  [facility] is not responsible for any belongings or valuables.    Do NOT Smoke (Tobacco/Vaping)  24 hours prior to your procedure  If you use a CPAP at night, you may bring your mask for your overnight stay.   Contacts, glasses, hearing aids, dentures or partials may not be worn into surgery, please bring cases for these belongings   For patients admitted to the hospital, discharge time will be determined by your treatment team.   Patients discharged the day of surgery will not be allowed to drive home, and someone needs to stay with them for 24 hours.   SURGICAL WAITING ROOM VISITATION Patients having surgery or a procedure may have no more than 2 support people in the waiting area - these visitors may rotate.   Children under the age of 67 must have an adult with them who is not the patient. If the patient needs to stay at the hospital during part of their recovery, the visitor guidelines for inpatient rooms apply. Pre-op nurse will coordinate an appropriate time for 1 support person to accompany patient in pre-op.  This support person may not rotate.   Please refer to [email or web address] for the visitor guidelines for Inpatients (after your surgery is over and you are in a regular room).    Special instructions:    Oral Hygiene is also important to reduce your risk of infection.  Remember - BRUSH YOUR TEETH THE MORNING OF SURGERY WITH YOUR REGULAR TOOTHPASTE   [facility]- Preparing For Surgery  Before  surgery, you can play an important role. Because skin is not sterile, your skin needs to be as free of germs as possible. You can reduce the number of germs on your skin by washing with CHG (chlorahexidine gluconate) Soap before surgery.  CHG is an antiseptic cleaner which kills germs and bonds with the skin to continue killing germs even after washing.     Please do not  use if you have an allergy to CHG or antibacterial soaps. If your skin becomes reddened/irritated stop using the CHG.  Do not shave (including legs and underarms) for at least 48 hours prior to first CHG shower. It is OK to shave your face.  Please follow these instructions carefully.     Shower the NIGHT BEFORE SURGERY and the MORNING OF SURGERY with CHG Soap.   If you chose to wash your hair, wash your hair first as usual with your normal shampoo. After you shampoo, rinse your hair and body thoroughly to remove the shampoo.  Then [employer] and genitals (private parts) with your normal soap and rinse thoroughly to remove soap.  After that Use CHG Soap as you would any other liquid soap. You can apply CHG directly to the skin and wash gently with a scrungie or a clean washcloth.   Apply the CHG Soap to your body ONLY FROM THE NECK DOWN.  Do not use on open wounds or open sores. Avoid contact with your eyes, ears, mouth and genitals (private parts). Wash Face and genitals (private parts)  with your normal soap.   Wash thoroughly, paying special attention to the area where your surgery will be performed.  Thoroughly rinse your body with warm water from the neck down.  DO NOT shower/wash with your normal soap after using and rinsing off the CHG Soap.  Pat yourself dry with a CLEAN TOWEL.  Wear CLEAN PAJAMAS to bed the night before surgery  Place CLEAN SHEETS on your bed the night before your surgery  DO NOT SLEEP WITH PETS.   Day of Surgery: Take a shower with CHG soap. Wear Clean/Comfortable clothing the morning of surgery Do not apply any deodorants/lotions.   Remember to brush your teeth WITH YOUR REGULAR TOOTHPASTE.    If you received a COVID test during your pre-op visit, it is requested that you wear a mask when out in public, stay away from anyone that may not be feeling well, and notify your surgeon if you develop symptoms. If you have been in contact with anyone that has  tested positive in the last 10 days, please notify your surgeon.    Please read over the following fact sheets that you were given.

## 2022-10-01 ENCOUNTER — Other Ambulatory Visit: Payer: Self-pay

## 2022-10-01 ENCOUNTER — Encounter (HOSPITAL_COMMUNITY): Payer: Self-pay

## 2022-10-01 ENCOUNTER — Other Ambulatory Visit: Payer: Self-pay | Admitting: Student

## 2022-10-01 ENCOUNTER — Encounter (HOSPITAL_COMMUNITY)
Admission: RE | Admit: 2022-10-01 | Discharge: 2022-10-01 | Disposition: A | Discharge status: Home / Self Care | Payer: Medicare Other | Source: Ambulatory Visit | Attending: General Surgery | Admitting: General Surgery

## 2022-10-01 VITALS — BP 101/61 | HR 98 | Temp 98.2°F | Resp 18 | Ht 69.0 in | Wt 156.9 lb

## 2022-10-01 DIAGNOSIS — Z01818 Encounter for other preprocedural examination: Secondary | ICD-10-CM

## 2022-10-01 DIAGNOSIS — F17210 Nicotine dependence, cigarettes, uncomplicated: Secondary | ICD-10-CM | POA: Insufficient documentation

## 2022-10-01 DIAGNOSIS — I10 Essential (primary) hypertension: Secondary | ICD-10-CM | POA: Insufficient documentation

## 2022-10-01 DIAGNOSIS — B9689 Other specified bacterial agents as the cause of diseases classified elsewhere: Secondary | ICD-10-CM | POA: Insufficient documentation

## 2022-10-01 DIAGNOSIS — Z01812 Encounter for preprocedural laboratory examination: Secondary | ICD-10-CM | POA: Insufficient documentation

## 2022-10-01 DIAGNOSIS — L02211 Cutaneous abscess of abdominal wall: Secondary | ICD-10-CM | POA: Insufficient documentation

## 2022-10-01 DIAGNOSIS — J439 Emphysema, unspecified: Secondary | ICD-10-CM | POA: Insufficient documentation

## 2022-10-01 LAB — CBC
HCT: 44.4 % (ref 39.0–52.0)
Hemoglobin: 14.6 g/dL (ref 13.0–17.0)
MCH: 31.9 pg (ref 26.0–34.0)
MCHC: 32.9 g/dL (ref 30.0–36.0)
MCV: 96.9 fL (ref 80.0–100.0)
Platelets: 406 10*3/uL — ABNORMAL HIGH (ref 150–400)
RBC: 4.58 MIL/uL (ref 4.22–5.81)
RDW: 14.1 % (ref 11.5–15.5)
WBC: 7.2 10*3/uL (ref 4.0–10.5)
nRBC: 0 % (ref 0.0–0.2)

## 2022-10-01 LAB — COMPREHENSIVE METABOLIC PANEL
ALT: 17 U/L (ref 0–44)
AST: 28 U/L (ref 15–41)
Albumin: 4.1 g/dL (ref 3.5–5.0)
Alkaline Phosphatase: 85 U/L (ref 38–126)
Anion gap: 14 (ref 5–15)
BUN: 11 mg/dL (ref 6–20)
CO2: 23 mmol/L (ref 22–32)
Calcium: 9.6 mg/dL (ref 8.9–10.3)
Chloride: 99 mmol/L (ref 98–111)
Creatinine, Ser: 0.95 mg/dL (ref 0.61–1.24)
GFR, Estimated: >60 mL/min (ref >60)
Glucose, Bld: 148 mg/dL — ABNORMAL HIGH (ref 70–99)
Potassium: 4.4 mmol/L (ref 3.5–5.1)
Sodium: 136 mmol/L (ref 135–145)
Total Bilirubin: 0.6 mg/dL (ref 0.3–1.2)
Total Protein: 7.7 g/dL (ref 6.5–8.1)

## 2022-10-01 LAB — SURGICAL PCR SCREEN
MRSA, PCR: NEGATIVE
Staphylococcus aureus: POSITIVE — AB

## 2022-10-01 NOTE — Progress Notes (Signed)
Surgical Instructions    Your procedure is scheduled on Friday, 10/04/22.  Report to [facility] Main Entrance "A" at 5:30 A.M., then check in with the Admitting office.  Call this number if you have problems the morning of surgery:  [phone]   If you have any questions prior to your surgery date call [phone]: Open Monday-Friday 8am-4pm If you experience any cold or flu symptoms such as cough, fever, chills, shortness of breath, etc. between now and your scheduled surgery, please notify us at the above number     Remember:  Do not eat after midnight the night before your surgery  You may drink clear liquids until 4:30am the morning of your surgery.   Clear liquids allowed are: Water, Non-Citrus Juices (without pulp), Carbonated Beverages, Clear Tea, Black Coffee ONLY (NO MILK, CREAM OR POWDERED CREAMER of any kind), and Gatorade    Take these medicines the morning of surgery with A SIP OF WATER:  busPIRone (BUSPAR)  pantoprazole (PROTONIX)  sertraline (ZOLOFT)   IF NEEDED: albuterol (PROVENTIL) (2.5 MG/3ML) nebulizer and/or inhaler- bring inhaler with you the day of surgery cyclobenzaprine (FLEXERIL)  gabapentin (NEURONTIN)  ondansetron (ZOFRAN-ODT)   As of today, STOP taking any Aspirin (unless otherwise instructed by your surgeon) Aleve, Naproxen, Ibuprofen, Motrin, Advil, Goody's, BC's, all herbal medications, fish oil, and all vitamins.           Do not wear jewelry or makeup. Do not wear lotions, powders, cologne or deodorant. Men may shave face and neck. Do not bring valuables to the hospital. Do not wear nail polish, gel polish, artificial nails, or any other type of covering on natural nails (fingers and toes) If you have artificial nails or gel coating that need to be removed by a nail salon, please have this removed prior to surgery. Artificial nails or gel coating may interfere with anesthesia's ability to adequately monitor your vital signs.  [facility] is  not responsible for any belongings or valuables.    Do NOT Smoke (Tobacco/Vaping)  24 hours prior to your procedure  If you use a CPAP at night, you may bring your mask for your overnight stay.   Contacts, glasses, hearing aids, dentures or partials may not be worn into surgery, please bring cases for these belongings   For patients admitted to the hospital, discharge time will be determined by your treatment team.   Patients discharged the day of surgery will not be allowed to drive home, and someone needs to stay with them for 24 hours.   SURGICAL WAITING ROOM VISITATION Patients having surgery or a procedure may have no more than 2 support people in the waiting area - these visitors may rotate.   Children under the age of 84 must have an adult with them who is not the patient. If the patient needs to stay at the hospital during part of their recovery, the visitor guidelines for inpatient rooms apply. Pre-op nurse will coordinate an appropriate time for 1 support person to accompany patient in pre-op.  This support person may not rotate.   Please refer to [email or web address] for the visitor guidelines for Inpatients (after your surgery is over and you are in a regular room).    Special instructions:    Oral Hygiene is also important to reduce your risk of infection.  Remember - BRUSH YOUR TEETH THE MORNING OF SURGERY WITH YOUR REGULAR TOOTHPASTE   [place]- Preparing For Surgery  Before surgery, you can play an important role. Because  skin is not sterile, your skin needs to be as free of germs as possible. You can reduce the number of germs on your skin by washing with CHG (chlorahexidine gluconate) Soap before surgery.  CHG is an antiseptic cleaner which kills germs and bonds with the skin to continue killing germs even after washing.     Please do not use if you have an allergy to CHG or antibacterial soaps. If your skin becomes  reddened/irritated stop using the CHG.  Do not shave (including legs and underarms) for at least 48 hours prior to first CHG shower. It is OK to shave your face.  Please follow these instructions carefully.     Shower the NIGHT BEFORE SURGERY and the MORNING OF SURGERY with CHG Soap.   If you chose to wash your hair, wash your hair first as usual with your normal shampoo. After you shampoo, rinse your hair and body thoroughly to remove the shampoo.  Then [employer] and genitals (private parts) with your normal soap and rinse thoroughly to remove soap.  After that Use CHG Soap as you would any other liquid soap. You can apply CHG directly to the skin and wash gently with a scrungie or a clean washcloth.   Apply the CHG Soap to your body ONLY FROM THE NECK DOWN.  Do not use on open wounds or open sores. Avoid contact with your eyes, ears, mouth and genitals (private parts). Wash Face and genitals (private parts)  with your normal soap.   Wash thoroughly, paying special attention to the area where your surgery will be performed.  Thoroughly rinse your body with warm water from the neck down.  DO NOT shower/wash with your normal soap after using and rinsing off the CHG Soap.  Pat yourself dry with a CLEAN TOWEL.  Wear CLEAN PAJAMAS to bed the night before surgery  Place CLEAN SHEETS on your bed the night before your surgery  DO NOT SLEEP WITH PETS.   Day of Surgery: Take a shower with CHG soap. Wear Clean/Comfortable clothing the morning of surgery Do not apply any deodorants/lotions.   Remember to brush your teeth WITH YOUR REGULAR TOOTHPASTE.    If you received a COVID test during your pre-op visit, it is requested that you wear a mask when out in public, stay away from anyone that may not be feeling well, and notify your surgeon if you develop symptoms. If you have been in contact with anyone that has tested positive in the last 10 days, please notify your surgeon.    Please read  over the following fact sheets that you were given.

## 2022-10-01 NOTE — Progress Notes (Signed)
PCP - Dr. Gerrit Heck Cardiologist - Dr. Larae Grooms. Saw MD once 03/01/22 for pre-op clearance.   PPM/ICD - n/a Device Orders - n/a Rep Notified - n/a  Chest x-ray - Ordered on 09/02/22 by Dr. Jinny Sanders. Has not been done yet.  EKG - 03/01/22 Stress Test - denies ECHO - 03/08/22 Cardiac Cath - denies  Sleep Study - denies CPAP - n/a  Fasting Blood Sugar - n/a Checks Blood Sugar _____ times a day- n/a  Last dose of GLP1 agonist-  n/a GLP1 instructions: n/a  Blood Thinner Instructions: n/a Aspirin Instructions: n/a  ERAS Protcol - Clear liquids until 0430 am day of surgery PRE-SURGERY Ensure or G2- None ordered  COVID TEST- n/a. Patient states he was positive for COVID around 08/22/22.   Anesthesia review: Yes. Clearance note 03/01/22. Patient's BP 141/104 and pulse 106 upon arrival to PAT. Patient states he was rushing this morning and forgot to take his BP medicine. Patient states he regularly takes his medicine. Willeen Cass, NP with anesthesia made aware. Per Levada Dy, patient instructed to take BP medicine as prescribed and to check BP after taking medicine to make sure it is controlled. Patient verbalized understanding. Patient also states he drinks a 6 pack of beer daily and smokes marijuana weekly. Willeen Cass, NP also made aware of this. Patient has chronic shortness of breath/COPD per Dr. Hassell Done note. Patient denies SOB at PAT appointment and states he only gets short of breath when climbing or walking up stairs.  BP 101/61 and pulse 98 when checked prior to patient leaving PAT.   Patient denies shortness of breath, fever, cough and chest pain at PAT appointment   All instructions explained to the patient, with a verbal understanding of the material. Patient agrees to go over the instructions while at home for a better understanding.  The opportunity to ask questions was provided.

## 2022-10-03 ENCOUNTER — Encounter (HOSPITAL_COMMUNITY): Payer: Self-pay | Admitting: General Surgery

## 2022-10-03 NOTE — Progress Notes (Signed)
Anesthesia Chart Review:   Case: [id number] Date/Time: 10/04/22 0715   Procedures:      EXPLORATORY LAPAROTOMY     DRAINAGE ABDOMIAL WALL ABSCESS     POSSIBLE REMOVAL OF MESH   Anesthesia type: General   Pre-op diagnosis: ABDOMINAL WALL ABSCESS   Location: [facility] OR ROOM 02 / [facility]   Surgeons: Jovita Kussmaul, MD       DISCUSSION: Pt is 57 years old with hx HTN, emphysema  Current smoker  BP was 141/104 initially at pre-admission testing - recheck 101/61.   Patient states he drinks a 6 pack of beer daily and smokes marijuana weekly    VS: BP 101/61   Pulse 98   Temp 36.8 C (Oral)   Resp 18   Ht '5\' 9"'$  (1.753 m)   Wt 71.2 kg   SpO2 97%   BMI 23.17 kg/m   PROVIDERS: - PCP is Gerrit Heck, MD - Saw cardiologist Larae Grooms, MD 03/01/22 for pre-op eval. Echo ordered -  "If function is normal by echocardiogram , no further cardiac testing will be needed before surgery". See echo results showing mildly reduced function below   LABS: Labs reviewed: Acceptable for surgery. (all labs ordered are listed, but only abnormal results are displayed)  Labs Reviewed  SURGICAL PCR SCREEN - Abnormal; Notable for the following components:      Result Value   Staphylococcus aureus POSITIVE (*)    All other components within normal limits  COMPREHENSIVE METABOLIC PANEL - Abnormal; Notable for the following components:   Glucose, Bld 148 (*)    All other components within normal limits  CBC - Abnormal; Notable for the following components:   Platelets 406 (*)    All other components within normal limits     IMAGES: CT chest 07/14/22:  1. Stable peribronchial ground-glass micronodularity, with associated bronchiectasis and bronchial wall thickening unchanged since prior exam. 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 2. Aortic Atherosclerosis (ICD10-I70.0). Coronary artery  atherosclerosis.   EKG 03/01/22: sinus tachycardia, LVH    CV: Echo 03/08/22:  1. Left ventricular ejection fraction, by estimation, is 45 to 50%. The left ventricle has mildly decreased function. The left ventricle demonstrates global hypokinesis. Left ventricular diastolic parameters are indeterminate. The average left ventricular global longitudinal strain is -11.6 %. The global longitudinal strain is abnormal.  2. Right ventricular systolic function is normal. The right ventricular size is normal.   3. The mitral valve is grossly normal. No evidence of mitral valve regurgitation. No evidence of mitral stenosis.  4. The aortic valve was not well visualized. Aortic valve regurgitation is not visualized. No aortic stenosis is present.  5. The inferior vena cava is normal in size with greater than 50% respiratory variability, suggesting right atrial pressure of 3 mmHg.    Past Medical History:  Diagnosis Date   Acute bronchitis 02/15/2013   Alcohol abuse    Anxiety    Bipolar disorder ([place])    Bronchitis    history   Bronchitis    Cellulitis    - left knee-2009   Chronic pain    Complication of anesthesia    Condyloma 02/04/2012   Depression    Diverticulosis    By colonoscopy June 2005   Dyspnea    on exertion at times   Elevated CK 09/2011   Emphysema lung ([place])    Emphysema of lung ([place])  Family history of anesthesia complication    Mother N/V   GERD (gastroesophageal reflux disease)    Glaucoma syndrome    does not use eye drops, "They burn".   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 Presumably secondary to diverticulosis.   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   Ventral hernia 01/11/2013   Repaired with mesh 05/20/13   Wrist pain 01/26/2019    Past Surgical History:  Procedure Laterality Date   ABDOMINAL EXPLORATION SURGERY  07/23/2012   w/LOA (07/23/2012)   [place] RESECTION  02/27/2012   Procedure: COLON RESECTION;  Surgeon: Merrie Roof, MD;  Location: [facility];  Service: General;  Laterality: N/A;   COLOSTOMY  02/27/2012   Procedure: COLOSTOMY;  Surgeon: Merrie Roof, MD;  Location: [facility];  Service: General;  Laterality: N/A;   COLOSTOMY TAKEDOWN  07/23/2012   COLOSTOMY TAKEDOWN  07/23/2012   Procedure: COLOSTOMY TAKEDOWN;  Surgeon: Merrie Roof, MD;  Location: [facility];  Service: General;  Laterality: N/A;  Primary Anastomosis   ELBOW FRACTURE SURGERY  ~ 1975   left;  pins inserted    INCISION AND DRAINAGE ABSCESS N/A 04/05/2021   Procedure: INCISION AND DRAINAGE ABDOMINAL WALL ABSCESS;  Surgeon: Jovita Kussmaul, MD;  Location: [facility];  Service: General;  Laterality: N/A;   INSERTION OF MESH N/A 05/20/2013   Procedure: INSERTION OF MESH;  Surgeon: Merrie Roof, MD;  Location: [facility];  Service: General;  Laterality: N/A;   LAPAROSCOPIC LEFT COLON RESECTION  02/19/2012   SIGMOID   LAPAROTOMY  02/27/2012   Procedure: EXPLORATORY LAPAROTOMY;  Surgeon: Merrie Roof, MD;  Location: [facility];  Service: General;  Laterality: N/A;   LAPAROTOMY  07/23/2012   Procedure: EXPLORATORY LAPAROTOMY;  Surgeon: Merrie Roof, MD;  Location: [facility];  Service: General;  Laterality: N/A;   LESION DESTRUCTION  07/23/2012   Procedure: DESTRUCTION LESION ANUS;  Surgeon: Merrie Roof, MD;  Location: [facility];  Service: General;   Laterality: N/A;  DESTRUCTION ANAL CONDYLOMA   LYSIS OF ADHESION  07/23/2012   Procedure: LYSIS OF ADHESION;  Surgeon: Merrie Roof, MD;  Location: [facility];  Service: General;  Laterality: N/A;   VENTRAL HERNIA REPAIR  05/20/2013   Dr Marlon Pel HERNIA REPAIR N/A 05/20/2013   Procedure: VENTRAL HERNIA REPAIR ;  Surgeon: Merrie Roof, MD;  Location: [facility];  Service: General;  Laterality: N/A;   WART FULGURATION  02/19/2012   Procedure: FULGURATION ANAL WART;  Surgeon: Merrie Roof, MD;  Location: [facility];  Service: General;  Laterality: N/A;  Destroy  Anal Condyloma    WISDOM TOOTH EXTRACTION  ~ [place]  DEBRIDEMENT N/A 06/03/2016   Procedure: ABDOMINAL WOUND EXPLORATION AND STITCH REMOVAL;  Surgeon: [name], MD;  Location: [facility];  Service: General;  Laterality: N/A;  ABDOMINAL WOUND EXPLORATION AND STITCH REMOVAL    MEDICATIONS:  albuterol (PROVENTIL) (2.5 MG/3ML) 0.083% nebulizer solution   albuterol (VENTOLIN HFA) 108 (90 Base) MCG/ACT inhaler   benzonatate (TESSALON PERLES) 100 MG capsule   busPIRone (BUSPAR) 15 MG tablet   cyclobenzaprine (FLEXERIL) 10 MG tablet   gabapentin (NEURONTIN) 100 MG capsule   losartan (COZAAR) 100 MG tablet   ondansetron (ZOFRAN-ODT) 4 MG disintegrating tablet   pantoprazole (PROTONIX) 40 MG tablet   sertraline (ZOLOFT) 100 MG tablet   umeclidinium-vilanterol (ANORO ELLIPTA) 62.5-25 MCG/ACT AEPB   varenicline (CHANTIX) 0.5 MG tablet   No current facility-administered medications for this encounter.    If no changes, I anticipate pt can proceed with surgery as scheduled.   Willeen Cass, PhD, FNP-BC [facility]/Anesthesiology Phone: [phone] 10/03/2022 9:36 AM

## 2022-10-03 NOTE — Anesthesia Preprocedure Evaluation (Addendum)
Anesthesia Evaluation  Patient identified by MRN, date of birth, ID band Patient awake    Reviewed: Allergy & Precautions, NPO status , Patient's Chart, lab work & pertinent test results, reviewed documented beta blocker date and time   History of Anesthesia Complications (+) Family history of anesthesia reaction and history of anesthetic complications  Airway Mallampati: II       Dental no notable dental hx. (+) Poor Dentition   Pulmonary shortness of breath and with exertion, COPD,  COPD inhaler, Current Smoker   Pulmonary exam normal breath sounds clear to auscultation       Cardiovascular hypertension, Pt. on medications Normal cardiovascular exam Rhythm:Regular Rate:Normal  Echo 03/08/22 1. Left ventricular ejection fraction, by estimation, is 45 to 50%. The  left ventricle has mildly decreased function. The left ventricle  demonstrates global hypokinesis. Left ventricular diastolic parameters are  indeterminate. The average left  ventricular global longitudinal strain is -11.6 %. The global longitudinal  strain is abnormal.   2. Right ventricular systolic function is normal. The right ventricular  size is normal.   3. The mitral valve is grossly normal. No evidence of mitral valve  regurgitation. No evidence of mitral stenosis.   4. The aortic valve was not well visualized. Aortic valve regurgitation  is not visualized. No aortic stenosis is present.   5. The inferior vena cava is normal in size with greater than 50%  respiratory variability, suggesting right atrial pressure of 3 mmHg.    EKG 03/01/22 Sinus tachycardia, abnormal QRST   Neuro/Psych  Headaches PSYCHIATRIC DISORDERS Anxiety Depression Bipolar Disorder   Hx/o glaucoma non compliant with Rx    GI/Hepatic PUD,GERD  Medicated,,(+)     substance abuse  alcohol use and cocaine useHx/o substance abuse S/P colostomy and reversal Hx/o diverticulosis and  diverticulitis   Endo/Other  negative endocrine ROS    Renal/GU negative Renal ROS  negative genitourinary   Musculoskeletal Abdominal wall abscess S/P remote ventral hernia repair   Abdominal   Peds  Hematology negative hematology ROS (+)   Anesthesia Other Findings   Reproductive/Obstetrics HPV Condyloma                              Anesthesia Physical Anesthesia Plan  ASA: 3  Anesthesia Plan: General   Post-op Pain Management: Minimal or no pain anticipated, Dilaudid IV, Precedex and Ketamine IV*   Induction: Intravenous  PONV Risk Score and Plan: 4 or greater and Treatment may vary due to age or medical condition, Midazolam, Ondansetron and Dexamethasone  Airway Management Planned: Oral ETT  Additional Equipment: None  Intra-op Plan:   Post-operative Plan:   Informed Consent: I have reviewed the patients History and Physical, chart, labs and discussed the procedure including the risks, benefits and alternatives for the proposed anesthesia with the patient or authorized representative who has indicated his/her understanding and acceptance.     Dental advisory given  Plan Discussed with: Anesthesiologist and CRNA  Anesthesia Plan Comments: (Current smoker, alcohol abuse, mildly reduced EF 45-50%. See APP note by Durel Salts, FNP )        Anesthesia Quick Evaluation

## 2022-10-04 ENCOUNTER — Other Ambulatory Visit: Payer: Self-pay

## 2022-10-04 ENCOUNTER — Inpatient Hospital Stay (HOSPITAL_COMMUNITY): Payer: Medicare Other

## 2022-10-04 ENCOUNTER — Inpatient Hospital Stay (HOSPITAL_COMMUNITY)
Admission: RE | Admit: 2022-10-04 | Discharge: 2022-10-19 | DRG: 907 | Disposition: A | Discharge status: Home Health | Payer: Medicare Other | Attending: Surgery | Admitting: Surgery

## 2022-10-04 ENCOUNTER — Inpatient Hospital Stay (HOSPITAL_COMMUNITY): Payer: Medicare Other | Admitting: Physician Assistant

## 2022-10-04 ENCOUNTER — Encounter (HOSPITAL_COMMUNITY): Payer: Self-pay | Admitting: General Surgery

## 2022-10-04 ENCOUNTER — Inpatient Hospital Stay (HOSPITAL_COMMUNITY): Payer: Medicare Other | Admitting: Certified Registered"

## 2022-10-04 ENCOUNTER — Encounter (HOSPITAL_COMMUNITY)
Admission: RE | Disposition: A | Discharge status: Home / Self Care | Payer: Self-pay | Source: Ambulatory Visit | Attending: General Surgery

## 2022-10-04 DIAGNOSIS — K219 Gastro-esophageal reflux disease without esophagitis: Secondary | ICD-10-CM | POA: Diagnosis present

## 2022-10-04 DIAGNOSIS — H409 Unspecified glaucoma: Secondary | ICD-10-CM | POA: Diagnosis present

## 2022-10-04 DIAGNOSIS — F17210 Nicotine dependence, cigarettes, uncomplicated: Secondary | ICD-10-CM

## 2022-10-04 DIAGNOSIS — E440 Moderate protein-calorie malnutrition: Secondary | ICD-10-CM | POA: Insufficient documentation

## 2022-10-04 DIAGNOSIS — F319 Bipolar disorder, unspecified: Secondary | ICD-10-CM | POA: Diagnosis present

## 2022-10-04 DIAGNOSIS — G8929 Other chronic pain: Secondary | ICD-10-CM | POA: Diagnosis present

## 2022-10-04 DIAGNOSIS — B977 Papillomavirus as the cause of diseases classified elsewhere: Secondary | ICD-10-CM | POA: Diagnosis present

## 2022-10-04 DIAGNOSIS — Z781 Physical restraint status: Secondary | ICD-10-CM

## 2022-10-04 DIAGNOSIS — T8579XA Infection and inflammatory reaction due to other internal prosthetic devices, implants and grafts, initial encounter: Secondary | ICD-10-CM | POA: Diagnosis present

## 2022-10-04 DIAGNOSIS — Y832 Surgical operation with anastomosis, bypass or graft as the cause of abnormal reaction of the patient, or of later complication, without mention of misadventure at the time of the procedure: Secondary | ICD-10-CM | POA: Diagnosis not present

## 2022-10-04 DIAGNOSIS — R0902 Hypoxemia: Secondary | ICD-10-CM | POA: Diagnosis not present

## 2022-10-04 DIAGNOSIS — Z811 Family history of alcohol abuse and dependence: Secondary | ICD-10-CM

## 2022-10-04 DIAGNOSIS — K9189 Other postprocedural complications and disorders of digestive system: Secondary | ICD-10-CM | POA: Diagnosis not present

## 2022-10-04 DIAGNOSIS — L02211 Cutaneous abscess of abdominal wall: Secondary | ICD-10-CM | POA: Diagnosis not present

## 2022-10-04 DIAGNOSIS — Y831 Surgical operation with implant of artificial internal device as the cause of abnormal reaction of the patient, or of later complication, without mention of misadventure at the time of the procedure: Secondary | ICD-10-CM | POA: Diagnosis present

## 2022-10-04 DIAGNOSIS — E876 Hypokalemia: Secondary | ICD-10-CM | POA: Diagnosis not present

## 2022-10-04 DIAGNOSIS — K6811 Postprocedural retroperitoneal abscess: Secondary | ICD-10-CM | POA: Diagnosis present

## 2022-10-04 DIAGNOSIS — Z886 Allergy status to analgesic agent status: Secondary | ICD-10-CM

## 2022-10-04 DIAGNOSIS — A419 Sepsis, unspecified organism: Secondary | ICD-10-CM | POA: Diagnosis present

## 2022-10-04 DIAGNOSIS — K660 Peritoneal adhesions (postprocedural) (postinfection): Secondary | ICD-10-CM

## 2022-10-04 DIAGNOSIS — Z9889 Other specified postprocedural states: Secondary | ICD-10-CM

## 2022-10-04 DIAGNOSIS — J449 Chronic obstructive pulmonary disease, unspecified: Secondary | ICD-10-CM

## 2022-10-04 DIAGNOSIS — Z833 Family history of diabetes mellitus: Secondary | ICD-10-CM

## 2022-10-04 DIAGNOSIS — I10 Essential (primary) hypertension: Secondary | ICD-10-CM | POA: Diagnosis present

## 2022-10-04 DIAGNOSIS — F410 Panic disorder [episodic paroxysmal anxiety] without agoraphobia: Secondary | ICD-10-CM | POA: Diagnosis present

## 2022-10-04 DIAGNOSIS — Z885 Allergy status to narcotic agent status: Secondary | ICD-10-CM

## 2022-10-04 DIAGNOSIS — K651 Peritoneal abscess: Secondary | ICD-10-CM | POA: Diagnosis present

## 2022-10-04 DIAGNOSIS — Z6823 Body mass index (BMI) 23.0-23.9, adult: Secondary | ICD-10-CM

## 2022-10-04 DIAGNOSIS — Z8249 Family history of ischemic heart disease and other diseases of the circulatory system: Secondary | ICD-10-CM

## 2022-10-04 DIAGNOSIS — J439 Emphysema, unspecified: Secondary | ICD-10-CM | POA: Diagnosis present

## 2022-10-04 DIAGNOSIS — K567 Ileus, unspecified: Secondary | ICD-10-CM | POA: Diagnosis not present

## 2022-10-04 DIAGNOSIS — L089 Local infection of the skin and subcutaneous tissue, unspecified: Secondary | ICD-10-CM | POA: Diagnosis present

## 2022-10-04 DIAGNOSIS — K631 Perforation of intestine (nontraumatic): Secondary | ICD-10-CM | POA: Diagnosis present

## 2022-10-04 DIAGNOSIS — K5669 Other partial intestinal obstruction: Secondary | ICD-10-CM | POA: Diagnosis present

## 2022-10-04 DIAGNOSIS — Z939 Artificial opening status, unspecified: Secondary | ICD-10-CM

## 2022-10-04 DIAGNOSIS — I959 Hypotension, unspecified: Secondary | ICD-10-CM | POA: Diagnosis not present

## 2022-10-04 DIAGNOSIS — Z8711 Personal history of peptic ulcer disease: Secondary | ICD-10-CM

## 2022-10-04 DIAGNOSIS — Z888 Allergy status to other drugs, medicaments and biological substances status: Secondary | ICD-10-CM

## 2022-10-04 DIAGNOSIS — K5790 Diverticulosis of intestine, part unspecified, without perforation or abscess without bleeding: Secondary | ICD-10-CM | POA: Diagnosis present

## 2022-10-04 DIAGNOSIS — Z83719 Family history of colon polyps, unspecified: Secondary | ICD-10-CM

## 2022-10-04 DIAGNOSIS — N179 Acute kidney failure, unspecified: Secondary | ICD-10-CM | POA: Diagnosis not present

## 2022-10-04 DIAGNOSIS — B9562 Methicillin resistant Staphylococcus aureus infection as the cause of diseases classified elsewhere: Secondary | ICD-10-CM | POA: Diagnosis present

## 2022-10-04 DIAGNOSIS — Z933 Colostomy status: Secondary | ICD-10-CM

## 2022-10-04 DIAGNOSIS — Z818 Family history of other mental and behavioral disorders: Secondary | ICD-10-CM

## 2022-10-04 DIAGNOSIS — Z720 Tobacco use: Secondary | ICD-10-CM | POA: Diagnosis present

## 2022-10-04 HISTORY — PX: COLON RESECTION: SHX5231

## 2022-10-04 HISTORY — DX: Peritoneal abscess: K65.1

## 2022-10-04 HISTORY — PX: IRRIGATION AND DEBRIDEMENT ABSCESS: SHX5252

## 2022-10-04 HISTORY — PX: LYSIS OF ADHESION: SHX5961

## 2022-10-04 HISTORY — PX: LAPAROTOMY: SHX154

## 2022-10-04 HISTORY — PX: EXCISION OF MESH: SHX6268

## 2022-10-04 SURGERY — LAPAROTOMY, EXPLORATORY
Anesthesia: General | Site: Abdomen

## 2022-10-04 MED ORDER — NALOXONE HCL 0.4 MG/ML IJ SOLN: 0.4000 mg | INTRAMUSCULAR | Status: DC | PRN

## 2022-10-04 MED ORDER — DIPHENHYDRAMINE HCL 50 MG/ML IJ SOLN
INTRAMUSCULAR | Status: AC
  Filled 2022-10-04: qty 1

## 2022-10-04 MED ORDER — ROCURONIUM BROMIDE 10 MG/ML (PF) SYRINGE
PREFILLED_SYRINGE | INTRAVENOUS | Status: AC
  Filled 2022-10-04: qty 10

## 2022-10-04 MED ORDER — CELECOXIB 200 MG PO CAPS
200.0000 mg | ORAL_CAPSULE | ORAL | Status: AC
  Administered 2022-10-04: 200 mg via ORAL
  Filled 2022-10-04: qty 1

## 2022-10-04 MED ORDER — PROPOFOL 10 MG/ML IV BOLUS
INTRAVENOUS | Status: AC
  Filled 2022-10-04: qty 20

## 2022-10-04 MED ORDER — ENOXAPARIN SODIUM 30 MG/0.3ML IJ SOSY
30.0000 mg | PREFILLED_SYRINGE | INTRAMUSCULAR | Status: DC
  Administered 2022-10-05 – 2022-10-07 (×3): 30 mg via SUBCUTANEOUS
  Filled 2022-10-04 (×3): qty 0.3

## 2022-10-04 MED ORDER — EPHEDRINE 5 MG/ML INJ
INTRAVENOUS | Status: AC
  Filled 2022-10-04: qty 5

## 2022-10-04 MED ORDER — ONDANSETRON HCL 4 MG/2ML IJ SOLN
4.0000 mg | Freq: Four times a day (QID) | INTRAMUSCULAR | Status: DC | PRN
  Administered 2022-10-05: 4 mg via INTRAVENOUS

## 2022-10-04 MED ORDER — DEXAMETHASONE SODIUM PHOSPHATE 10 MG/ML IJ SOLN
INTRAMUSCULAR | Status: DC | PRN
  Administered 2022-10-04: 10 mg via INTRAVENOUS

## 2022-10-04 MED ORDER — ORAL CARE MOUTH RINSE: 15.0000 mL | Freq: Once | OROMUCOSAL | Status: AC

## 2022-10-04 MED ORDER — ONDANSETRON HCL 4 MG/2ML IJ SOLN
INTRAMUSCULAR | Status: AC
  Filled 2022-10-04: qty 2

## 2022-10-04 MED ORDER — FENTANYL CITRATE 1250 MCG/25ML IV SOSY
PREFILLED_SYRINGE | INTRAVENOUS | Status: DC
  Administered 2022-10-04: 15 mL via INTRAVENOUS
  Filled 2022-10-04: qty 25
  Filled 2022-10-04: qty 1250
  Filled 2022-10-04: qty 25

## 2022-10-04 MED ORDER — DIPHENHYDRAMINE HCL 50 MG/ML IJ SOLN
INTRAMUSCULAR | Status: DC | PRN
  Administered 2022-10-04: 25 mg via INTRAVENOUS

## 2022-10-04 MED ORDER — PHENYLEPHRINE HCL-NACL 20-0.9 MG/250ML-% IV SOLN
INTRAVENOUS | Status: AC
  Filled 2022-10-04: qty 500

## 2022-10-04 MED ORDER — LABETALOL HCL 5 MG/ML IV SOLN
INTRAVENOUS | Status: DC | PRN
  Administered 2022-10-04 (×3): 5 mg via INTRAVENOUS

## 2022-10-04 MED ORDER — KETAMINE HCL 50 MG/5ML IJ SOSY
PREFILLED_SYRINGE | INTRAMUSCULAR | Status: AC
  Filled 2022-10-04: qty 5

## 2022-10-04 MED ORDER — CHLORHEXIDINE GLUCONATE CLOTH 2 % EX PADS: 6.0000 | MEDICATED_PAD | Freq: Once | CUTANEOUS | Status: DC

## 2022-10-04 MED ORDER — MIDAZOLAM HCL 2 MG/2ML IJ SOLN
INTRAMUSCULAR | Status: AC
  Filled 2022-10-04: qty 2

## 2022-10-04 MED ORDER — CEFAZOLIN SODIUM-DEXTROSE 2-4 GM/100ML-% IV SOLN
2.0000 g | INTRAVENOUS | Status: AC
  Administered 2022-10-04 (×2): 2 g via INTRAVENOUS
  Filled 2022-10-04: qty 100

## 2022-10-04 MED ORDER — DIPHENHYDRAMINE HCL 12.5 MG/5ML PO ELIX
12.5000 mg | ORAL_SOLUTION | Freq: Four times a day (QID) | ORAL | Status: DC | PRN
  Filled 2022-10-04: qty 5

## 2022-10-04 MED ORDER — ONDANSETRON HCL 4 MG/2ML IJ SOLN
INTRAMUSCULAR | Status: DC | PRN
  Administered 2022-10-04: 4 mg via INTRAVENOUS

## 2022-10-04 MED ORDER — SODIUM CHLORIDE 0.9% FLUSH: 9.0000 mL | INTRAVENOUS | Status: DC | PRN

## 2022-10-04 MED ORDER — DIPHENHYDRAMINE HCL 50 MG/ML IJ SOLN: 12.5000 mg | Freq: Four times a day (QID) | INTRAMUSCULAR | Status: DC | PRN

## 2022-10-04 MED ORDER — LIDOCAINE 2% (20 MG/ML) 5 ML SYRINGE
INTRAMUSCULAR | Status: AC
  Filled 2022-10-04: qty 5

## 2022-10-04 MED ORDER — SCOPOLAMINE 1 MG/3DAYS TD PT72
MEDICATED_PATCH | TRANSDERMAL | Status: AC
  Filled 2022-10-04: qty 1

## 2022-10-04 MED ORDER — MIDAZOLAM HCL 2 MG/2ML IJ SOLN
INTRAMUSCULAR | Status: DC | PRN
  Administered 2022-10-04: 2 mg via INTRAVENOUS

## 2022-10-04 MED ORDER — HYDROMORPHONE HCL 1 MG/ML IJ SOLN
INTRAMUSCULAR | Status: AC
  Filled 2022-10-04: qty 1

## 2022-10-04 MED ORDER — ALBUTEROL SULFATE HFA 108 (90 BASE) MCG/ACT IN AERS
INHALATION_SPRAY | RESPIRATORY_TRACT | Status: DC | PRN
  Administered 2022-10-04: 4 via RESPIRATORY_TRACT

## 2022-10-04 MED ORDER — CEFAZOLIN SODIUM 1 G IJ SOLR
INTRAMUSCULAR | Status: AC
  Filled 2022-10-04: qty 20

## 2022-10-04 MED ORDER — ROCURONIUM BROMIDE 10 MG/ML (PF) SYRINGE
PREFILLED_SYRINGE | INTRAVENOUS | Status: DC | PRN
  Administered 2022-10-04 (×2): 30 mg via INTRAVENOUS
  Administered 2022-10-04: 70 mg via INTRAVENOUS
  Administered 2022-10-04 (×2): 30 mg via INTRAVENOUS

## 2022-10-04 MED ORDER — FENTANYL CITRATE (PF) 250 MCG/5ML IJ SOLN
INTRAMUSCULAR | Status: AC
  Filled 2022-10-04: qty 5

## 2022-10-04 MED ORDER — PROPOFOL 10 MG/ML IV BOLUS
INTRAVENOUS | Status: DC | PRN
  Administered 2022-10-04: 150 mg via INTRAVENOUS

## 2022-10-04 MED ORDER — LABETALOL HCL 5 MG/ML IV SOLN
INTRAVENOUS | Status: AC
  Filled 2022-10-04: qty 4

## 2022-10-04 MED ORDER — LABETALOL HCL 5 MG/ML IV SOLN
10.0000 mg | Freq: Once | INTRAVENOUS | Status: AC
  Administered 2022-10-04: 10 mg via INTRAVENOUS

## 2022-10-04 MED ORDER — HYDROMORPHONE HCL 1 MG/ML IJ SOLN
0.2500 mg | INTRAMUSCULAR | Status: DC | PRN
  Administered 2022-10-04 (×4): 0.5 mg via INTRAVENOUS

## 2022-10-04 MED ORDER — ONDANSETRON 8 MG PO TBDP: 4.0000 mg | ORAL_TABLET | Freq: Four times a day (QID) | ORAL | Status: DC | PRN

## 2022-10-04 MED ORDER — ONDANSETRON HCL 4 MG/2ML IJ SOLN
4.0000 mg | Freq: Four times a day (QID) | INTRAMUSCULAR | Status: DC | PRN
  Administered 2022-10-04: 4 mg via INTRAVENOUS
  Filled 2022-10-04 (×2): qty 2

## 2022-10-04 MED ORDER — PANTOPRAZOLE SODIUM 40 MG IV SOLR
40.0000 mg | Freq: Every day | INTRAVENOUS | Status: DC
  Administered 2022-10-04 – 2022-10-09 (×6): 40 mg via INTRAVENOUS
  Filled 2022-10-04 (×6): qty 10

## 2022-10-04 MED ORDER — POTASSIUM CHLORIDE IN NACL 20-0.9 MEQ/L-% IV SOLN
INTRAVENOUS | Status: DC
  Filled 2022-10-04 (×6): qty 1000

## 2022-10-04 MED ORDER — KETAMINE HCL 10 MG/ML IJ SOLN
INTRAMUSCULAR | Status: DC | PRN
  Administered 2022-10-04: 20 mg via INTRAVENOUS

## 2022-10-04 MED ORDER — HYDROMORPHONE HCL 1 MG/ML IJ SOLN
INTRAMUSCULAR | Status: DC | PRN
  Administered 2022-10-04: .5 mg via INTRAVENOUS

## 2022-10-04 MED ORDER — LIDOCAINE HCL URETHRAL/MUCOSAL 2 % EX GEL
CUTANEOUS | Status: AC
  Filled 2022-10-04: qty 11

## 2022-10-04 MED ORDER — INDOCYANINE GREEN 25 MG IV SOLR
INTRAVENOUS | Status: DC | PRN
  Administered 2022-10-04 (×2): 5 mg via INTRAVENOUS

## 2022-10-04 MED ORDER — CHLORHEXIDINE GLUCONATE 0.12 % MT SOLN
15.0000 mL | Freq: Once | OROMUCOSAL | Status: AC
  Administered 2022-10-04: 15 mL via OROMUCOSAL
  Filled 2022-10-04: qty 15

## 2022-10-04 MED ORDER — GABAPENTIN 300 MG PO CAPS
300.0000 mg | ORAL_CAPSULE | ORAL | Status: AC
  Administered 2022-10-04: 300 mg via ORAL
  Filled 2022-10-04: qty 1

## 2022-10-04 MED ORDER — FENTANYL CITRATE (PF) 250 MCG/5ML IJ SOLN
INTRAMUSCULAR | Status: DC | PRN
  Administered 2022-10-04: 50 ug via INTRAVENOUS
  Administered 2022-10-04: 100 ug via INTRAVENOUS
  Administered 2022-10-04: 250 ug via INTRAVENOUS

## 2022-10-04 MED ORDER — STERILE WATER FOR INJECTION IJ SOLN
INTRAMUSCULAR | Status: AC
  Administered 2022-10-04: 10 mL
  Filled 2022-10-04: qty 10

## 2022-10-04 MED ORDER — ALBUTEROL SULFATE HFA 108 (90 BASE) MCG/ACT IN AERS: 2.0000 | INHALATION_SPRAY | Freq: Four times a day (QID) | RESPIRATORY_TRACT | Status: DC | PRN

## 2022-10-04 MED ORDER — ALBUTEROL SULFATE (2.5 MG/3ML) 0.083% IN NEBU
2.5000 mg | INHALATION_SOLUTION | Freq: Four times a day (QID) | RESPIRATORY_TRACT | Status: DC | PRN
  Administered 2022-10-06 (×2): 2.5 mg via RESPIRATORY_TRACT
  Filled 2022-10-04 (×2): qty 3

## 2022-10-04 MED ORDER — LIDOCAINE 2% (20 MG/ML) 5 ML SYRINGE
INTRAMUSCULAR | Status: DC | PRN
  Administered 2022-10-04: 80 mg via INTRAVENOUS

## 2022-10-04 MED ORDER — OXYCODONE HCL 5 MG PO TABS: 5.0000 mg | ORAL_TABLET | Freq: Once | ORAL | Status: DC | PRN

## 2022-10-04 MED ORDER — OXYCODONE HCL 5 MG/5ML PO SOLN: 5.0000 mg | Freq: Once | ORAL | Status: DC | PRN

## 2022-10-04 MED ORDER — LACTATED RINGERS IV SOLN: INTRAVENOUS | Status: DC

## 2022-10-04 MED ORDER — SCOPOLAMINE 1 MG/3DAYS TD PT72
MEDICATED_PATCH | TRANSDERMAL | Status: DC | PRN
  Administered 2022-10-04: 1 via TRANSDERMAL

## 2022-10-04 MED ORDER — PHENYLEPHRINE 80 MCG/ML (10ML) SYRINGE FOR IV PUSH (FOR BLOOD PRESSURE SUPPORT)
PREFILLED_SYRINGE | INTRAVENOUS | Status: AC
  Filled 2022-10-04: qty 10

## 2022-10-04 MED ORDER — HYDROMORPHONE HCL 1 MG/ML IJ SOLN
INTRAMUSCULAR | Status: AC
  Filled 2022-10-04: qty 0.5

## 2022-10-04 MED ORDER — ONDANSETRON HCL 4 MG/2ML IJ SOLN
4.0000 mg | Freq: Once | INTRAMUSCULAR | Status: AC | PRN
  Administered 2022-10-04: 4 mg via INTRAVENOUS

## 2022-10-04 MED ORDER — SUGAMMADEX SODIUM 200 MG/2ML IV SOLN
INTRAVENOUS | Status: DC | PRN
  Administered 2022-10-04: 150 mg via INTRAVENOUS
  Administered 2022-10-04: 50 mg via INTRAVENOUS

## 2022-10-04 MED ORDER — 0.9 % SODIUM CHLORIDE (POUR BTL) OPTIME
TOPICAL | Status: DC | PRN
  Administered 2022-10-04: 4000 mL

## 2022-10-04 MED ORDER — DEXAMETHASONE SODIUM PHOSPHATE 10 MG/ML IJ SOLN
INTRAMUSCULAR | Status: AC
  Filled 2022-10-04: qty 1

## 2022-10-04 MED ORDER — PIPERACILLIN-TAZOBACTAM 3.375 G IVPB
3.3750 g | Freq: Three times a day (TID) | INTRAVENOUS | Status: AC
  Administered 2022-10-04 – 2022-10-09 (×15): 3.375 g via INTRAVENOUS
  Filled 2022-10-04 (×15): qty 50

## 2022-10-04 SURGICAL SUPPLY — 57 items
APL PRP STRL LF DISP 70% ISPRP (MISCELLANEOUS) ×1
BAG COUNTER SPONGE SURGICOUNT (BAG) ×1 IMPLANT
BAG SPNG CNTER NS LX DISP (BAG)
BLADE CLIPPER SURG (BLADE) IMPLANT
BNDG GAUZE DERMACEA FLUFF 4 (GAUZE/BANDAGES/DRESSINGS) IMPLANT
BNDG GZE DERMACEA 4 6PLY (GAUZE/BANDAGES/DRESSINGS) ×1
BRR ADH 5X3 SEPRAFILM 6 SHT (MISCELLANEOUS) ×1
CANISTER SUCT 3000ML PPV (MISCELLANEOUS) ×1 IMPLANT
CATH COUDE FOLEY 2W 5CC 16FR (CATHETERS) IMPLANT
CHLORAPREP W/TINT 26 (MISCELLANEOUS) ×1 IMPLANT
COVER SURGICAL LIGHT HANDLE (MISCELLANEOUS) ×1 IMPLANT
DRAPE LAPAROSCOPIC ABDOMINAL (DRAPES) ×1 IMPLANT
DRAPE UTILITY 15X26 TOWEL STRL (DRAPES) IMPLANT
DRAPE WARM FLUID 44X44 (DRAPES) ×1 IMPLANT
DRSG OPSITE POSTOP 4X10 (GAUZE/BANDAGES/DRESSINGS) IMPLANT
DRSG OPSITE POSTOP 4X8 (GAUZE/BANDAGES/DRESSINGS) IMPLANT
ELECT BLADE 6.5 EXT (BLADE) IMPLANT
ELECT CAUTERY BLADE 6.4 (BLADE) ×1 IMPLANT
ELECT REM PT RETURN 9FT ADLT (ELECTROSURGICAL) ×1
ELECTRODE REM PT RTRN 9FT ADLT (ELECTROSURGICAL) ×1 IMPLANT
GAUZE PAD ABD 8X10 STRL (GAUZE/BANDAGES/DRESSINGS) IMPLANT
GAUZE SPONGE 4X4 12PLY STRL (GAUZE/BANDAGES/DRESSINGS) IMPLANT
GLOVE BIO SURGEON STRL SZ7.5 (GLOVE) ×1 IMPLANT
GOWN STRL REUS W/ TWL LRG LVL3 (GOWN DISPOSABLE) ×2 IMPLANT
GOWN STRL REUS W/TWL LRG LVL3 (GOWN DISPOSABLE) ×2
HANDLE SUCTION POOLE (INSTRUMENTS) ×1 IMPLANT
KIT BASIN OR (CUSTOM PROCEDURE TRAY) ×1 IMPLANT
KIT TURNOVER KIT B (KITS) ×1 IMPLANT
LIGASURE IMPACT 36 18CM CVD LR (INSTRUMENTS) IMPLANT
NS IRRIG 1000ML POUR BTL (IV SOLUTION) ×2 IMPLANT
PACK GENERAL/GYN (CUSTOM PROCEDURE TRAY) ×1 IMPLANT
PACK SPY-PHI (KITS) IMPLANT
PAD ARMBOARD 7.5X6 YLW CONV (MISCELLANEOUS) ×1 IMPLANT
PENCIL BUTTON HOLSTER BLD 10FT (ELECTRODE) IMPLANT
RELOAD PROXIMATE 75MM BLUE (ENDOMECHANICALS) ×1 IMPLANT
RELOAD STAPLE 75 3.8 BLU REG (ENDOMECHANICALS) IMPLANT
SEPRAFILM PROCEDURAL PACK 3X5 (MISCELLANEOUS) IMPLANT
SPECIMEN JAR LARGE (MISCELLANEOUS) IMPLANT
SPONGE T-LAP 18X18 ~~LOC~~+RFID (SPONGE) IMPLANT
STAPLER ECHELON POWER CIR 29 (STAPLE) IMPLANT
STAPLER PROXIMATE 75MM BLUE (STAPLE) IMPLANT
STAPLER VISISTAT 35W (STAPLE) ×1 IMPLANT
SUCTION POOLE HANDLE (INSTRUMENTS) ×1
SUT NOVA 1 T20/GS 25DT (SUTURE) IMPLANT
SUT PDS AB 1 TP1 96 (SUTURE) ×2 IMPLANT
SUT PROLENE 2 0 CT2 30 (SUTURE) IMPLANT
SUT SILK 2 0 SH CR/8 (SUTURE) ×1 IMPLANT
SUT SILK 2 0 TIES 10X30 (SUTURE) ×1 IMPLANT
SUT SILK 3 0 SH CR/8 (SUTURE) ×1 IMPLANT
SUT SILK 3 0 TIES 10X30 (SUTURE) ×1 IMPLANT
SUT VIC AB 3-0 SH 18 (SUTURE) IMPLANT
SWAB COLLECTION DEVICE MRSA (MISCELLANEOUS) IMPLANT
SWAB CULTURE ESWAB REG 1ML (MISCELLANEOUS) IMPLANT
SYR BULB IRRIG 60ML STRL (SYRINGE) IMPLANT
TOWEL GREEN STERILE (TOWEL DISPOSABLE) ×1 IMPLANT
TRAY FOLEY MTR SLVR 16FR STAT (SET/KITS/TRAYS/PACK) IMPLANT
YANKAUER SUCT BULB TIP NO VENT (SUCTIONS) IMPLANT

## 2022-10-04 NOTE — Interval H&P Note (Signed)
History and Physical Interval Note:  10/04/2022 7:21 AM  Timothy [name]  has presented today for surgery, with the diagnosis of ABDOMINAL WALL ABSCESS.  The various methods of treatment have been discussed with the patient and family. After consideration of risks, benefits and other options for treatment, the patient has consented to  Procedure(s): EXPLORATORY LAPAROTOMY (N/A) [place] (N/A) POSSIBLE REMOVAL OF MESH (N/A) as a surgical intervention.  The patient's history has been reviewed, patient examined, no change in status, stable for surgery.  I have reviewed the patient's chart and labs.  Questions were answered to the patient's satisfaction.     [name]

## 2022-10-04 NOTE — Op Note (Signed)
10/04/2022  11:26 AM  PATIENT:  Timothy [name]  57 y.o. male  PRE-OPERATIVE DIAGNOSIS:  ABDOMINAL WALL ABSCESS  POST-OPERATIVE DIAGNOSIS:  ABDOMINAL WALL ABSCESS  PROCEDURE:  Procedure(s): EXPLORATORY LAPAROTOMY (N/A) DRAINAGE ABDOMIAL WALL ABSCESS (N/A) REMOVAL OF MESH (N/A) SEGMENTAL COLON RESECTION (N/A) LYSIS OF ADHESION (N/A) USE OF INDOCYANINE GREEN IMAGING  SURGEON:  Surgeon(s) and Role:    * Jovita Kussmaul, MD - Primary    * Greer Pickerel, MD - Assisting  PHYSICIAN ASSISTANT:   ASSISTANTS: Dr. Redmond Pulling   ANESTHESIA:   general  EBL:  100 mL   BLOOD ADMINISTERED:none  DRAINS: none   LOCAL MEDICATIONS USED:  NONE  SPECIMEN:  Source of Specimen:  removed mesh, segment of sigmoid colon  DISPOSITION OF SPECIMEN:  PATHOLOGY  COUNTS:  YES  TOURNIQUET:  * No tourniquets in log *  DICTATION: .Dragon Dictation  After informed consent was obtained the patient was brought to the operating room and placed in the supine position on the operating table.  After adequate induction of general anesthesia the patient's abdomen was prepped with ChloraPrep, allowed to dry, and draped in usual sterile manner.  A midline incision was made through his previous incision with a 10 blade knife.  The incision was carried through the skin and subcutaneous tissue sharply with the electrocautery until the previous Novafil stitches and mesh were encountered.  We were able to enter the abdominal cavity through the mesh superiorly into a free space.  I was then able to bluntly dissected the adhesions off the back of the mesh inferiorly.  The entire incision was then opened under direct vision with the electrocautery.  We did run into a pocket of pus along the mid abdomen significantly involved with the mesh where the mesh was not incorporated into the abdominal wall.  This mesh was removed sharply with the electrocautery.  This was sent to pathology for further evaluation.  The small bowel was then  freed from the anterior abdominal wall by sharp dissection with the Metzenbaum scissors.  There were extensive adhesions that took quite some time to fully free up.  There was 1 loop of small bowel identified in the left lower quadrant that was densely adherent to the colon at this location.  There appeared to be a significant stricture of the colon at the site.  I was able to free the small bowel from the colon sharply with the Metzenbaum scissors and in doing so we were able to identify a small hole in the colon at the site of stricture.  I then elected to resect this segment of colon.  We chose a site above and below the area of perforation.  The mesentery at each site was then opened with a combination of sharp Bovie dissection and blunt hemostat dissection.  We then injected some indocyanine green and used the camera to evaluate the blood supply to the distal segment of the proximal colon.  The end of the proximal colon did not appear to have good perfusion.  We then removed a little extra distal end of the proximal colon back to where there was good perfusion.  Next the distal segment was opened and EEA sizers were used to choose a 29 mm size stapler.  The anvil was placed in the distal segment and a 2-0 Prolene pursestring stitch was placed around the edge of the colon.  The pursestring stitch was then sits down around the neck of the anvil and tied.  A small opening  was then made proximal to the staple line on the proximal segment.  The stapling device was placed through this opening into the lumen of the colon.  The spike was then brought out anterior to the staple line.  The anvil and spike were joined and the device was closed.  The stapling device was fired thereby creating a nice widely patent enteroenterostomy.  The proximal opening was then closed transversely with interrupted 2-0 silk stitches.  At this point the gloves were changed and new drapes were applied.  The abdomen was then irrigated with  copious amounts of saline.  Seprafilm was then applied to the anterior surface of the bowel.  The anterior abdominal wall was then closed with 2 running #1 double-stranded looped PDS sutures with several interrupted #1 Novafil stitches as well.  The skin was left open and the subcutaneous tissue was packed with moistened Kerlix gauze.  Sterile dressings were applied.  The patient tolerated the procedure well.  At the end of the case all needle sponge and instrument counts were correct.  The patient was then awakened and taken to recovery in stable condition.  The assistant was necessary for completion of the case.  PLAN OF CARE: Admit to inpatient   PATIENT DISPOSITION:  PACU - hemodynamically stable.   Delay start of Pharmacological VTE agent (>24hrs) due to surgical blood loss or risk of bleeding: no

## 2022-10-04 NOTE — Anesthesia Procedure Notes (Signed)
Procedure Name: Intubation Date/Time: 10/04/2022 7:38 AM  Performed by: Reggie Pile, CRNAPre-anesthesia Checklist: Patient identified, Emergency Drugs available, Suction available and Patient being monitored Patient Re-evaluated:Patient Re-evaluated prior to induction Oxygen Delivery Method: Circle system utilized Preoxygenation: Pre-oxygenation with 100% oxygen Induction Type: IV induction Ventilation: Mask ventilation without difficulty Laryngoscope Size: Mac and 3 Grade View: Grade I Tube type: Oral Tube size: 7.0 mm Number of attempts: 1 Airway Equipment and Method: Stylet and Oral airway Placement Confirmation: ETT inserted through vocal cords under direct vision, positive ETCO2 and breath sounds checked- equal and bilateral Secured at: 22 cm Tube secured with: Tape Dental Injury: Teeth and Oropharynx as per pre-operative assessment  Comments: Intubated by EMT student

## 2022-10-04 NOTE — H&P (Signed)
PROVIDER: [name], MD  MRN: [id number] DOB: [birth year] Subjective  Chief Complaint: Re-Check (Bulge in abd area. Pain radiating down left leg)   History of Present Illness: Timothy [name] is a 57 y.o. male who is seen today for an abdominal wall abscess. The patient is a 57 year old white male who is about 9 years status post ventral hernia repair with mesh. He initially did well but over the last couple years has had multiple episodes where he developed swelling in the suprapubic area. We have tried to incise and drain the area superficially but have not been able to get the area to resolve completely. He recently had more pain and swelling associated with the same area. He is currently on doxycycline. He is still smoking.    Review of Systems: A complete review of systems was obtained from the patient. I have reviewed this information and discussed as appropriate with the patient. See HPI as well for other ROS.  ROS  Medical History: Past Medical History: Diagnosis Date Arthritis Hypertension  Patient Active Problem List Diagnosis Abdominal wall abscess Abscess of abdominal wall  Past Surgical History: Procedure Laterality Date Laparoscopic Sigmoid Colectomy 02/19/2012 Dr. Marlou Starks Exploratory Laparotomy; Colostomy; Colon Resection 02/27/2012 Dr. Marlou Starks Colostomy Takedown 07/23/2012 Dr. Marlon Pel Hernia Repair with Mesh 05/20/2013 Dr. Marlou Starks Abdominal Wound Exploration and Stitch Removal 06/03/2016 Dr. Marlou Starks Incision and Drainage Abdominal Wall Abscess 04/05/2021 Dr. Marlou Starks   Allergies Allergen Reactions Lisinopril Cough Mupirocin Calcium Swelling Caused infection at a surgical site Propoxyphene N-Acetaminophen Hives and Itching Zolpidem Tartrate Other (See Comments) Caused agitation of mood.  Current Outpatient Medications on File Prior to Visit Medication Sig Dispense Refill albuterol 90 mcg/actuation inhaler Inhale 2 inhalations into the lungs every 6  (six) hours as needed aspirin-acetaminophen-caffeine 520-260-32.5 mg PwPk Take by mouth busPIRone (BUSPAR) 15 MG tablet Take by mouth cyclobenzaprine (FLEXERIL) 10 MG tablet TAKE 1 TABLET BY MOUTH EVERY DAY AS NEEDED FOR MUSCLE SPASMS gabapentin (NEURONTIN) 100 MG capsule oxyCODONE (ROXICODONE) 5 MG immediate release tablet Take 1 tablet (5 mg total) by mouth every 6 (six) hours as needed for Pain 10 tablet 0 oxyCODONE (ROXICODONE) 5 MG immediate release tablet Take 1 tablet (5 mg total) by mouth every 6 (six) hours as needed for Pain 10 tablet 0 oxyCODONE (ROXICODONE) 5 MG immediate release tablet Take 1 tablet (5 mg total) by mouth every 6 (six) hours as needed for Pain 10 tablet 0 oxyCODONE (ROXICODONE) 5 MG immediate release tablet Take 1 tablet (5 mg total) by mouth every 6 (six) hours as needed for Pain 10 tablet 0 pantoprazole (PROTONIX) 40 MG DR tablet Take 40 mg by mouth once daily  No current facility-administered medications on file prior to visit.  Family History Problem Relation Age of Onset Diabetes Mother Anxiety Father Breast cancer Maternal Aunt Diabetes Maternal Grandmother   Social History  Tobacco Use Smoking Status Every Day Packs/day: 1.00 Years: 30.00 Additional pack years: 0.00 Total pack years: 30.00 Types: Cigarettes Smokeless Tobacco Former   Social History  Socioeconomic History Marital status: Single Tobacco Use Smoking status: Every Day Packs/day: 1.00 Years: 30.00 Additional pack years: 0.00 Total pack years: 30.00 Types: Cigarettes Smokeless tobacco: Former Substance and Sexual Activity Alcohol use: Defer Drug use: Defer  Objective:  Vitals: PainSc: 7 PainLoc: Abdomen  There is no height or weight on file to calculate BMI.  Physical Exam Constitutional: General: He is not in acute distress. Appearance: Normal appearance. HENT: Head: Normocephalic and atraumatic. Right Ear: External ear  normal. Left Ear: External ear  normal. Nose: Nose normal. Mouth/Throat: Mouth: Mucous membranes are moist. Pharynx: Oropharynx is clear. Eyes: General: No scleral icterus. Extraocular Movements: Extraocular movements intact. Conjunctiva/sclera: Conjunctivae normal. Pupils: Pupils are equal, round, and reactive to light. Cardiovascular: Rate and Rhythm: Normal rate and regular rhythm. Pulses: Normal pulses. Heart sounds: Normal heart sounds. Pulmonary: Effort: Pulmonary effort is normal. No respiratory distress. Breath sounds: Normal breath sounds. Abdominal: General: Abdomen is flat. Bowel sounds are normal. There is no distension. Palpations: Abdomen is soft. Comments: There is swelling with some fluctuance in the suprapubic area. There is minimal redness to the skin. This is at the lower edge of the previous midline incision. There is significant tenderness associated with it. Musculoskeletal: General: No swelling or deformity. Normal range of motion. Cervical back: Normal range of motion and neck supple. No tenderness. Skin: General: Skin is warm and dry. Coloration: Skin is not jaundiced. Neurological: General: No focal deficit present. Mental Status: He is alert and oriented to person, place, and time. Psychiatric: Mood and Affect: Mood normal. Behavior: Behavior normal.    Labs, Imaging and Diagnostic Testing:  Assessment and Plan:  Diagnoses and all orders for this visit:  Abdominal wall abscess    The patient appears to have a recurrent abdominal wall abscess. It is certainly possible that there could be some infection of the mesh from his previous ventral hernia repair. I have tried to superficially drain the area but it does not resolve. At this point I feel he will need a more extensive exploration of the abdomen with possible removal of some of the mesh and drainage of the fluid. I have discussed with him in detail the risks and benefits of the operation as well as some of the technical  aspects including the possibility of injury to the intestines and he understands and wishes to proceed. We will try to get a CT scan of his abdomen pelvis prior to surgery to help [place] with surgical planning

## 2022-10-04 NOTE — Anesthesia Postprocedure Evaluation (Signed)
Anesthesia Post Note  Patient: Timothy [name]  Procedure(s) Performed: EXPLORATORY LAPAROTOMY (Abdomen) DRAINAGE ABDOMIAL WALL ABSCESS (Abdomen) REMOVAL OF MESH (Abdomen) SEGMENTAL COLON RESECTION (Abdomen) LYSIS OF ADHESION (Abdomen)     Patient location during evaluation: PACU Anesthesia Type: General Level of consciousness: awake and alert and oriented Pain management: pain level controlled Vital Signs Assessment: post-procedure vital signs reviewed and stable Respiratory status: spontaneous breathing, nonlabored ventilation and respiratory function stable Cardiovascular status: blood pressure returned to baseline and stable Postop Assessment: no apparent nausea or vomiting Anesthetic complications: no   No notable events documented.  Last Vitals:  Vitals:   10/04/22 1204 10/04/22 1215  BP: (!) 164/106 (!) 174/99  Pulse: (!) 105 (!) 102  Resp: 12 16  Temp:    SpO2: 95% 96%    Last Pain:  Vitals:   10/04/22 1200  TempSrc:   PainSc: 10-Worst pain ever                 Giovana Faciane A.

## 2022-10-04 NOTE — Transfer of Care (Signed)
Immediate Anesthesia Transfer of Care Note  Patient: Timothy [name]  Procedure(s) Performed: EXPLORATORY LAPAROTOMY (Abdomen) DRAINAGE ABDOMIAL WALL ABSCESS (Abdomen) REMOVAL OF MESH (Abdomen) SEGMENTAL COLON RESECTION (Abdomen) LYSIS OF ADHESION (Abdomen)  Patient Location: PACU  Anesthesia Type:General  Level of Consciousness: awake and alert   Airway & Oxygen Therapy: Patient Spontanous Breathing and Patient connected to face mask oxygen  Post-op Assessment: Report given to RN, Post -op Vital signs reviewed and stable, and Patient moving all extremities  Post vital signs: Reviewed and stable  Last Vitals:  Vitals Value Taken Time  BP 152/90 10/04/22 1133  Temp    Pulse 100 10/04/22 1139  Resp 16 10/04/22 1139  SpO2 96 % 10/04/22 1139  Vitals shown include unvalidated device data.  Last Pain:  Vitals:   10/04/22 0624  TempSrc:   PainSc: 0-No pain         Complications: No notable events documented.

## 2022-10-05 ENCOUNTER — Inpatient Hospital Stay (HOSPITAL_COMMUNITY): Payer: Medicare Other

## 2022-10-05 LAB — BASIC METABOLIC PANEL
Anion gap: 10 (ref 5–15)
BUN: 11 mg/dL (ref 6–20)
CO2: 22 mmol/L (ref 22–32)
Calcium: 7.8 mg/dL — ABNORMAL LOW (ref 8.9–10.3)
Chloride: 104 mmol/L (ref 98–111)
Creatinine, Ser: 0.86 mg/dL (ref 0.61–1.24)
GFR, Estimated: >60 mL/min (ref >60)
Glucose, Bld: 163 mg/dL — ABNORMAL HIGH (ref 70–99)
Potassium: 4.3 mmol/L (ref 3.5–5.1)
Sodium: 136 mmol/L (ref 135–145)

## 2022-10-05 LAB — CBC
HCT: 36.9 % — ABNORMAL LOW (ref 39.0–52.0)
Hemoglobin: 12.4 g/dL — ABNORMAL LOW (ref 13.0–17.0)
MCH: 32.7 pg (ref 26.0–34.0)
MCHC: 33.6 g/dL (ref 30.0–36.0)
MCV: 97.4 fL (ref 80.0–100.0)
Platelets: 325 10*3/uL (ref 150–400)
RBC: 3.79 MIL/uL — ABNORMAL LOW (ref 4.22–5.81)
RDW: 14.2 % (ref 11.5–15.5)
WBC: 14 10*3/uL — ABNORMAL HIGH (ref 4.0–10.5)
nRBC: 0 % (ref 0.0–0.2)

## 2022-10-05 MED ORDER — HYDROMORPHONE HCL 1 MG/ML IJ SOLN: 1.0000 mg | INTRAMUSCULAR | Status: DC | PRN

## 2022-10-05 MED ORDER — ONDANSETRON HCL 4 MG/2ML IJ SOLN
4.0000 mg | Freq: Four times a day (QID) | INTRAMUSCULAR | Status: DC | PRN
  Administered 2022-10-05 – 2022-10-13 (×6): 4 mg via INTRAVENOUS
  Filled 2022-10-05 (×6): qty 2

## 2022-10-05 MED ORDER — DIPHENHYDRAMINE HCL 50 MG/ML IJ SOLN
12.5000 mg | Freq: Four times a day (QID) | INTRAMUSCULAR | Status: DC | PRN
  Administered 2022-10-06: 12.5 mg via INTRAVENOUS
  Filled 2022-10-05: qty 1

## 2022-10-05 MED ORDER — METHOCARBAMOL 1000 MG/10ML IJ SOLN
500.0000 mg | Freq: Four times a day (QID) | INTRAVENOUS | Status: DC
  Administered 2022-10-05 – 2022-10-10 (×17): 500 mg via INTRAVENOUS
  Filled 2022-10-05 (×3): qty 500
  Filled 2022-10-05: qty 5
  Filled 2022-10-05: qty 500
  Filled 2022-10-05: qty 5
  Filled 2022-10-05 (×4): qty 500
  Filled 2022-10-05: qty 5
  Filled 2022-10-05 (×3): qty 500
  Filled 2022-10-05: qty 5
  Filled 2022-10-05: qty 500
  Filled 2022-10-05 (×2): qty 5
  Filled 2022-10-05 (×3): qty 500

## 2022-10-05 MED ORDER — STERILE WATER FOR INJECTION IJ SOLN
INTRAMUSCULAR | Status: AC
  Administered 2022-10-05: 10 mL
  Filled 2022-10-05: qty 10

## 2022-10-05 MED ORDER — KETOROLAC TROMETHAMINE 30 MG/ML IJ SOLN
30.0000 mg | Freq: Three times a day (TID) | INTRAMUSCULAR | Status: AC | PRN
  Administered 2022-10-05 – 2022-10-06 (×2): 30 mg via INTRAVENOUS
  Filled 2022-10-05 (×2): qty 1

## 2022-10-05 MED ORDER — DIPHENHYDRAMINE HCL 12.5 MG/5ML PO ELIX: 12.5000 mg | ORAL_SOLUTION | Freq: Four times a day (QID) | ORAL | Status: DC | PRN

## 2022-10-05 MED ORDER — SODIUM CHLORIDE 0.9% FLUSH
9.0000 mL | INTRAVENOUS | Status: DC | PRN
  Administered 2022-10-10 (×2): 9 mL via INTRAVENOUS

## 2022-10-05 MED ORDER — HYDROMORPHONE 1 MG/ML IV SOLN
INTRAVENOUS | Status: DC
  Administered 2022-10-05: 2.1 mg via INTRAVENOUS
  Administered 2022-10-05: 2.3 mg via INTRAVENOUS
  Administered 2022-10-05: 30 mg via INTRAVENOUS
  Administered 2022-10-06: 2.4 mg via INTRAVENOUS
  Administered 2022-10-06: 1.5 mg via INTRAVENOUS
  Administered 2022-10-06: 3 mg via INTRAVENOUS
  Administered 2022-10-06: 30 mg via INTRAVENOUS
  Administered 2022-10-06: 1.8 mg via INTRAVENOUS
  Administered 2022-10-07 (×2): 30 mg via INTRAVENOUS
  Administered 2022-10-07: 2.4 mg via INTRAVENOUS
  Administered 2022-10-07: 30 mg via INTRAVENOUS
  Administered 2022-10-08: 3.6 mg via INTRAVENOUS
  Administered 2022-10-08: 1.5 mg via INTRAVENOUS
  Administered 2022-10-08: 3.3 mg via INTRAVENOUS
  Administered 2022-10-08: 2.7 mg via INTRAVENOUS
  Administered 2022-10-08: 4.8 mg via INTRAVENOUS
  Administered 2022-10-08: 1.8 mg via INTRAVENOUS
  Administered 2022-10-09: 1.2 mg via INTRAVENOUS
  Administered 2022-10-09: 60 mg via INTRAVENOUS
  Administered 2022-10-09: 2.4 mg via INTRAVENOUS
  Administered 2022-10-09: 3.9 mg via INTRAVENOUS
  Administered 2022-10-10: 1 mg via INTRAVENOUS
  Administered 2022-10-10: 2.1 mg via INTRAVENOUS
  Administered 2022-10-10: 1.5 mg via INTRAVENOUS
  Administered 2022-10-10: 1.8 mg via INTRAVENOUS
  Administered 2022-10-10: 1 mg via INTRAVENOUS
  Administered 2022-10-11: 1.5 mg via INTRAVENOUS
  Administered 2022-10-12: 2.4 mg via INTRAVENOUS
  Administered 2022-10-12: 4.8 mg via INTRAVENOUS
  Administered 2022-10-12: 1.59 mg via INTRAVENOUS
  Administered 2022-10-12: 30 mg via INTRAVENOUS
  Administered 2022-10-13: 0.6 mg via INTRAVENOUS
  Administered 2022-10-13: 3 mg via INTRAVENOUS
  Administered 2022-10-13: 1.2 mg via INTRAVENOUS
  Filled 2022-10-05 (×4): qty 30

## 2022-10-05 MED ORDER — NALOXONE HCL 0.4 MG/ML IJ SOLN: 0.4000 mg | INTRAMUSCULAR | Status: DC | PRN

## 2022-10-05 NOTE — Plan of Care (Signed)
  Problem: Education: Goal: Knowledge of General Education information will improve Description: Including pain rating scale, medication(s)/side effects and non-pharmacologic comfort measures Outcome: Progressing   Problem: Health Behavior/Discharge Planning: Goal: Ability to manage health-related needs will improve Outcome: Progressing   Problem: Clinical Measurements: Goal: Ability to maintain clinical measurements within normal limits will improve Outcome: Progressing Goal: Respiratory complications will improve Outcome: Progressing   Problem: Activity: Goal: Risk for activity intolerance will decrease Outcome: Progressing   Problem: Nutrition: Goal: Adequate nutrition will be maintained Outcome: Progressing   Problem: Elimination: Goal: Will not experience complications related to urinary retention Outcome: Progressing   Problem: Pain Managment: Goal: General experience of comfort will improve Outcome: Progressing   Problem: Safety: Goal: Ability to remain free from injury will improve Outcome: Progressing   

## 2022-10-05 NOTE — Progress Notes (Signed)
   10/05/22 1808  Assess: MEWS Score  Temp 98.1 F (36.7 C)  BP (!) 147/98  MAP (mmHg) 114  Pulse Rate (!) 114  Resp 18  Level of Consciousness Alert  SpO2 96 %  O2 Device Nasal Cannula  Assess: MEWS Score  MEWS Temp 0  MEWS Systolic 0  MEWS Pulse 2  MEWS RR 0  MEWS LOC 0  MEWS Score 2  MEWS Score Color Yellow  Assess: if the MEWS score is Yellow or Red  Were vital signs taken at a resting state? Yes  Focused Assessment No change from prior assessment  Does the patient meet 2 or more of the SIRS criteria? No  Does the patient have a confirmed or suspected source of infection? No  MEWS guidelines implemented  Yes, yellow  Treat  MEWS Interventions Considered administering scheduled or prn medications/treatments as ordered  Take Vital Signs  Increase Vital Sign Frequency  Yellow: Q2hr x1, continue Q4hrs until patient remains green for 12hrs  Escalate  MEWS: Escalate Yellow: Discuss with charge nurse and consider notifying provider and/or RRT  Notify: Charge Nurse/RN  Name of Charge Nurse/RN Notified Joaquim Lai, RN  Provider Notification  Provider Name/Title Georganna Skeans, MD  Date Provider Notified 10/05/22  Time Provider Notified [phone]  Method of Notification Page  Notification Reason Other (Comment) (Yellow Mews-Elevated heart rate)  Provider response No new orders  Date of Provider Response 10/05/22  Time of Provider Response 1835  Assess: SIRS CRITERIA  SIRS Temperature  0  SIRS Pulse 1  SIRS Respirations  0  SIRS WBC 0  SIRS Score Sum  1

## 2022-10-05 NOTE — Progress Notes (Signed)
Dr. Louanna Raw, on call General surgery, called back, verbal order received to keep NGT tube, will decide in AM ( rounding ) if need to be removed.

## 2022-10-05 NOTE — Progress Notes (Signed)
@  1930:Received patient in bed, alert and oriented, in no acute distress, Fentanyl pca pump infusing, midline incision with dressing, clean, dry and intact. NGT on rt nares on low intermittent suction, minimal output on tubing ( light brown) none on canister.   @ 20:30 continuous pulse ox set up, 8 pm fentanyl dose, pca pump ongoing. vss stable.    @0030 : 00:00 am dose fentanyl given ( q4), vss, cont pulse ox ongoing.  RN paged general surgery on call, Dr. Louanna Raw, for NGT removal confirmation order, waiting for page/call back.

## 2022-10-05 NOTE — Progress Notes (Signed)
1 Day Post-Op   Subjective/Chief Complaint: Complains of pain   Objective: Vital signs in last 24 hours: Temp:  [97.2 F (36.2 C)-98.2 F (36.8 C)] 98.2 F (36.8 C) (03/16 0422) Pulse Rate:  [92-116] 104 (03/16 0644) Resp:  [11-23] 16 (03/16 0017) BP: (144-190)/(90-139) 171/106 (03/16 0644) SpO2:  [91 %-100 %] 96 % (03/16 0644)    Intake/Output from previous day: 03/15 0701 - 03/16 0700 In: 1850 [I.V.:1850] Out: 1465 [Urine:1365; Blood:100] Intake/Output this shift: No intake/output data recorded.  General appearance: alert and cooperative Resp: clear to auscultation bilaterally Cardio: regular rate and rhythm GI: diffuse tenderness  Lab Results:  Recent Labs    10/05/22 0237  WBC 14.0*  HGB 12.4*  HCT 36.9*  PLT 325   BMET Recent Labs    10/05/22 0237  NA 136  K 4.3  CL 104  CO2 22  GLUCOSE 163*  BUN 11  CREATININE 0.86  CALCIUM 7.8*   PT/INR No results for input(s): "LABPROT", "INR" in the last 72 hours. ABG No results for input(s): "PHART", "HCO3" in the last 72 hours.  Invalid input(s): "PCO2", "PO2"  Studies/Results: DG Abd 1 View  Result Date: 10/04/2022 CLINICAL DATA:  Enteric tube placement. Status post exploratory laparotomy. EXAM: ABDOMEN - 1 VIEW COMPARISON:  CT abdomen pelvis dated August 14, 2022. FINDINGS: Enteric tube tip in the distal esophagus. Distended stomach. Small volume pneumoperitoneum. Clear lung bases. IMPRESSION: 1. Enteric tube tip in the distal esophagus. Recommend advancement. 2. Postsurgical small volume pneumoperitoneum. Electronically Signed   By: Titus Dubin M.D.   On: 10/04/2022 12:09    Anti-infectives: Anti-infectives (From admission, onward)    Start     Dose/Rate Route Frequency Ordered Stop   10/04/22 1700  piperacillin-tazobactam (ZOSYN) IVPB 3.375 g        3.375 g 12.5 mL/hr over 240 Minutes Intravenous Every 8 hours 10/04/22 1613 10/09/22 1659   10/04/22 0615  ceFAZolin (ANCEF) IVPB 2g/100 mL premix         2 g 200 mL/hr over 30 Minutes Intravenous On call to O.R. 10/04/22 0601 10/04/22 1131       Assessment/Plan: s/p Procedure(s): EXPLORATORY LAPAROTOMY (N/A) DRAINAGE ABDOMIAL WALL ABSCESS (N/A) REMOVAL OF MESH (N/A) SEGMENTAL COLON RESECTION (N/A) LYSIS OF ADHESION (N/A) Continue ng and bowel rest Change pca to dilaudid Add robaxin and toradol for pain control OOB  POD 1 Continue empiric abx until cultures return Continue foley until pain is under better control  LOS: 1 day    [name] 10/05/2022

## 2022-10-06 MED ORDER — CHLORHEXIDINE GLUCONATE CLOTH 2 % EX PADS
6.0000 | MEDICATED_PAD | Freq: Every day | CUTANEOUS | Status: DC
  Administered 2022-10-07 – 2022-10-14 (×7): 6 via TOPICAL

## 2022-10-06 MED ORDER — METOPROLOL TARTRATE 25 MG PO TABS: 25.0000 mg | ORAL_TABLET | Freq: Two times a day (BID) | ORAL | Status: DC

## 2022-10-06 MED ORDER — METOPROLOL TARTRATE 5 MG/5ML IV SOLN
5.0000 mg | Freq: Four times a day (QID) | INTRAVENOUS | Status: DC
  Administered 2022-10-06 – 2022-10-10 (×15): 5 mg via INTRAVENOUS
  Filled 2022-10-06 (×14): qty 5

## 2022-10-06 NOTE — Progress Notes (Signed)
2 Days Post-Op   Subjective/Chief Complaint: Looks much better today. Significantly less pain   Objective: Vital signs in last 24 hours: Temp:  [97.7 F (36.5 C)-98.3 F (36.8 C)] 98.1 F (36.7 C) (03/17 0458) Pulse Rate:  [98-130] 112 (03/17 0458) Resp:  [16-20] 17 (03/17 0611) BP: (133-189)/(92-103) 153/95 (03/17 0458) SpO2:  [92 %-98 %] 98 % (03/17 0611) FiO2 (%):  [95 %] 95 % (03/17 0611)    Intake/Output from previous day: 03/16 0701 - 03/17 0700 In: 814.2 [I.V.:714.2; IV Piggyback:100] Out: 1300 [Urine:1300] Intake/Output this shift: No intake/output data recorded.  General appearance: alert and cooperative Resp: rhonchi bilaterally Cardio: regular rate and rhythm GI: soft, appropriately tender  Lab Results:  Recent Labs    10/05/22 0237  WBC 14.0*  HGB 12.4*  HCT 36.9*  PLT 325   BMET Recent Labs    10/05/22 0237  NA 136  K 4.3  CL 104  CO2 22  GLUCOSE 163*  BUN 11  CREATININE 0.86  CALCIUM 7.8*   PT/INR No results for input(s): "LABPROT", "INR" in the last 72 hours. ABG No results for input(s): "PHART", "HCO3" in the last 72 hours.  Invalid input(s): "PCO2", "PO2"  Studies/Results: DG Abd 1 View  Result Date: 10/05/2022 CLINICAL DATA:  Encounter for nasogastric tube placement. EXAM: ABDOMEN - 1 VIEW COMPARISON:  Radiograph yesterday FINDINGS: The tip of the enteric tube is below the diaphragm in the stomach, the side port remains in the region of the distal esophagus. Advancement of at least 5 cm is recommended for optimal placement. Decreased gaseous gastric distension. Small volume of free air in the abdomen, previously reported as postsurgical. IMPRESSION: The tip of the enteric tube is below the diaphragm in the stomach, the side port remains in the region of the distal esophagus. Advancement of at least 5 cm is recommended for optimal placement. Electronically Signed   By: Keith Rake M.D.   On: 10/05/2022 16:41   DG Abd 1  View  Result Date: 10/04/2022 CLINICAL DATA:  Enteric tube placement. Status post exploratory laparotomy. EXAM: ABDOMEN - 1 VIEW COMPARISON:  CT abdomen pelvis dated August 14, 2022. FINDINGS: Enteric tube tip in the distal esophagus. Distended stomach. Small volume pneumoperitoneum. Clear lung bases. IMPRESSION: 1. Enteric tube tip in the distal esophagus. Recommend advancement. 2. Postsurgical small volume pneumoperitoneum. Electronically Signed   By: Titus Dubin M.D.   On: 10/04/2022 12:09    Anti-infectives: Anti-infectives (From admission, onward)    Start     Dose/Rate Route Frequency Ordered Stop   10/04/22 1700  piperacillin-tazobactam (ZOSYN) IVPB 3.375 g        3.375 g 12.5 mL/hr over 240 Minutes Intravenous Every 8 hours 10/04/22 1613 10/09/22 1659   10/04/22 0615  ceFAZolin (ANCEF) IVPB 2g/100 mL premix        2 g 200 mL/hr over 30 Minutes Intravenous On call to O.R. 10/04/22 0601 10/04/22 1131       Assessment/Plan: s/p Procedure(s): EXPLORATORY LAPAROTOMY (N/A) DRAINAGE ABDOMIAL WALL ABSCESS (N/A) REMOVAL OF MESH (N/A) SEGMENTAL COLON RESECTION (N/A) LYSIS OF ADHESION (N/A) POD 2 Continue ng and bowel rest Ambulate Daily dressing changes Dilaudid pca for pain control Continue abx for now  LOS: 2 days    Timothy [name] 10/06/2022

## 2022-10-06 NOTE — Progress Notes (Signed)
Patient refuses 2nd site piv at this time.  "Don't want to mess with it now.,Later when I wake up"

## 2022-10-06 NOTE — Progress Notes (Signed)
Patient argumentative and aggressive verbally. Patient angry he can not drink water. Cup of ice water with lid and straw found at bedside. Educated patient with ability to teach back why he can not currently drink water. Oral swabs for dry mouth provided to patient. Patient refusing night shift ambulation, he is able to turn independently in bed. Patient refusing SCDs, education provided. Patient complained of nausea, zofran provided but refused advancement of NGT. The NGT is at 45 with small amount of brown fluid in tubing and none in cannister. Day shift nurse reported this is consistent with her assessment. Education provided to patient it will likely need advancement in order to be effective. Will discuss with MD for order. No complaints of SOB, vitals stable/unchanged, GCS 15.  Erling Conte, RN

## 2022-10-06 NOTE — Plan of Care (Signed)
°  Problem: Education: °Goal: Knowledge of General Education information will improve °Description: Including pain rating scale, medication(s)/side effects and non-pharmacologic comfort measures °Outcome: Progressing °  °Problem: Health Behavior/Discharge Planning: °Goal: Ability to manage health-related needs will improve °Outcome: Progressing °  °Problem: Clinical Measurements: °Goal: Ability to maintain clinical measurements within normal limits will improve °Outcome: Progressing °Goal: Will remain free from infection °Outcome: Progressing °Goal: Diagnostic test results will improve °Outcome: Progressing °Goal: Respiratory complications will improve °Outcome: Progressing °  °Problem: Pain Managment: °Goal: General experience of comfort will improve °Outcome: Progressing °  °

## 2022-10-07 ENCOUNTER — Inpatient Hospital Stay (HOSPITAL_COMMUNITY): Payer: Medicare Other

## 2022-10-07 ENCOUNTER — Encounter (HOSPITAL_COMMUNITY): Payer: Self-pay | Admitting: General Surgery

## 2022-10-07 LAB — CBC
HCT: 32.2 % — ABNORMAL LOW (ref 39.0–52.0)
Hemoglobin: 10.2 g/dL — ABNORMAL LOW (ref 13.0–17.0)
MCH: 31.9 pg (ref 26.0–34.0)
MCHC: 31.7 g/dL (ref 30.0–36.0)
MCV: 100.6 fL — ABNORMAL HIGH (ref 80.0–100.0)
Platelets: 271 10*3/uL (ref 150–400)
RBC: 3.2 MIL/uL — ABNORMAL LOW (ref 4.22–5.81)
RDW: 14.1 % (ref 11.5–15.5)
WBC: 11.4 10*3/uL — ABNORMAL HIGH (ref 4.0–10.5)
nRBC: 0 % (ref 0.0–0.2)

## 2022-10-07 LAB — BASIC METABOLIC PANEL
Anion gap: 10 (ref 5–15)
BUN: 7 mg/dL (ref 6–20)
CO2: 22 mmol/L (ref 22–32)
Calcium: 8.5 mg/dL — ABNORMAL LOW (ref 8.9–10.3)
Chloride: 107 mmol/L (ref 98–111)
Creatinine, Ser: 0.62 mg/dL (ref 0.61–1.24)
GFR, Estimated: >60 mL/min (ref >60)
Glucose, Bld: 93 mg/dL (ref 70–99)
Potassium: 3.8 mmol/L (ref 3.5–5.1)
Sodium: 139 mmol/L (ref 135–145)

## 2022-10-07 LAB — SURGICAL PATHOLOGY

## 2022-10-07 MED ORDER — ORAL CARE MOUTH RINSE: 15.0000 mL | OROMUCOSAL | Status: DC | PRN

## 2022-10-07 MED ORDER — ENOXAPARIN SODIUM 40 MG/0.4ML IJ SOSY
40.0000 mg | PREFILLED_SYRINGE | INTRAMUSCULAR | Status: DC
  Administered 2022-10-08 – 2022-10-19 (×12): 40 mg via SUBCUTANEOUS
  Filled 2022-10-07 (×12): qty 0.4

## 2022-10-07 MED ORDER — METOPROLOL TARTRATE 5 MG/5ML IV SOLN
5.0000 mg | Freq: Four times a day (QID) | INTRAVENOUS | Status: DC | PRN
  Administered 2022-10-07: 5 mg via INTRAVENOUS
  Filled 2022-10-07: qty 5

## 2022-10-07 NOTE — Progress Notes (Signed)
Pt encouraged to mobilize OOB x 3 on my shift. Pt initially tells MD during rounds that he will mobilize. He then tells me that he told the doctor he'd go for a walk, but has no intention of getting up to the recliner in his room. At my most recent offer to assist in mobility, pt states he will, but only after "all these tubes come out." Education provided to pt on the importance of early mobility in healing and prevention of post-op complications, especially as it relates to his condition.

## 2022-10-07 NOTE — Progress Notes (Signed)
Received call from Dr Lovena Le in radiology regarding abnormal KUB. Called on call surgeon Dr. Thermon Leyland with Dr Forde Dandy findings of increased air below diaphragm and she was concerned for possible perforation. Patient stable GCS 15, vital signs stable, pain currently at a 3/10, no nausea. Patients abdomen is distended and taut without bowel sounds which is unchanged from 1930. Patient states he feels better. Patient education to call me/nurse if he has increased pain or any concerns. No new orders received. Erling Conte, RN

## 2022-10-07 NOTE — Progress Notes (Signed)
3 Days Post-Op   Subjective/Chief Complaint: No complaint. He thinks he is feeling ok   Objective: Vital signs in last 24 hours: Temp:  [97.7 F (36.5 C)-98.3 F (36.8 C)] 97.9 F (36.6 C) (03/18 0755) Pulse Rate:  [93-124] 106 (03/18 0755) Resp:  [16-24] 18 (03/18 1201) BP: (147-186)/(96-116) 158/100 (03/18 1200) SpO2:  [95 %-98 %] 98 % (03/18 1201) FiO2 (%):  [28 %-95 %] 28 % (03/17 2052)    Intake/Output from previous day: 03/17 0701 - 03/18 0700 In: 2831.6 [I.V.:2327; IV Piggyback:504.6] Out: 2250 [Urine:1700; Emesis/NG output:550] Intake/Output this shift: Total I/O In: 20 [P.O.:20] Out: 500 [Urine:500]  General appearance: alert and cooperative Resp: rhonchi bilaterally Cardio: regular rate and rhythm and less tachy GI: distended. Moderate tenderness but less than it has been  Lab Results:  Recent Labs    10/05/22 0237 10/07/22 0927  WBC 14.0* 11.4*  HGB 12.4* 10.2*  HCT 36.9* 32.2*  PLT 325 271   BMET Recent Labs    10/05/22 0237 10/07/22 0927  NA 136 139  K 4.3 3.8  CL 104 107  CO2 22 22  GLUCOSE 163* 93  BUN 11 7  CREATININE 0.86 0.62  CALCIUM 7.8* 8.5*   PT/INR No results for input(s): "LABPROT", "INR" in the last 72 hours. ABG No results for input(s): "PHART", "HCO3" in the last 72 hours.  Invalid input(s): "PCO2", "PO2"  Studies/Results: DG Abd 1 View  Addendum Date: 10/07/2022   ADDENDUM REPORT: 10/07/2022 00:53 ADDENDUM: Large quantity of free air in the right upper quadrant, markedly increased from the prior exam, surgical consultation is recommended. Critical Value/emergent results were called by telephone at the time of interpretation on 10/07/2022 at 12:49 a.m. to provider Erling Conte RN, who verbally acknowledged these results. Electronically Signed   By: Brett Fairy M.D.   On: 10/07/2022 00:53   Result Date: 10/07/2022 CLINICAL DATA:  NGT placement. EXAM: ABDOMEN - 1 VIEW COMPARISON:  10/05/2022, 10/04/2022. FINDINGS: Multiple  loops of dilated small bowel are noted in the upper abdomen measuring up to 4.2 cm. There is free air underneath the right diaphragm. An enteric tube terminates in the stomach. IMPRESSION: 1. Increased pneumoperitoneum under the right diaphragm as compared with previous exams. Clinical correlation is recommended to exclude perforated viscus. 2. Multiple distended loops of small bowel in the abdomen measuring up to 4.2 cm, possible ileus versus obstruction. 3. Enteric tube terminates in the stomach. Electronically Signed: By: Brett Fairy M.D. On: 10/07/2022 00:45    Anti-infectives: Anti-infectives (From admission, onward)    Start     Dose/Rate Route Frequency Ordered Stop   10/04/22 1700  piperacillin-tazobactam (ZOSYN) IVPB 3.375 g        3.375 g 12.5 mL/hr over 240 Minutes Intravenous Every 8 hours 10/04/22 1613 10/09/22 1659   10/04/22 0615  ceFAZolin (ANCEF) IVPB 2g/100 mL premix        2 g 200 mL/hr over 30 Minutes Intravenous On call to O.R. 10/04/22 0601 10/04/22 1131       Assessment/Plan: s/p Procedure(s): EXPLORATORY LAPAROTOMY (N/A) DRAINAGE ABDOMIAL WALL ABSCESS (N/A) REMOVAL OF MESH (N/A) SEGMENTAL COLON RESECTION (N/A) LYSIS OF ADHESION (N/A) Continue bowel rest and ng Concerned about xray from last night but clinically he seems ok with wbc decreasing Will reevaluate in am and recheck labs  LOS: 3 days    Timothy [name] 10/07/2022

## 2022-10-07 NOTE — Care Management Important Message (Signed)
Important Message  Patient Details  Name: Timothy [name] MRN: [id number] Date of Birth: 14-Apr-1966   Medicare Important Message Given:  Yes     [name] 10/07/2022, 11:58 AM

## 2022-10-07 NOTE — Progress Notes (Signed)
3 Days Post-Op   Subjective/Chief Complaint: Complains of pain when he coughs. Otherwise ok   Objective: Vital signs in last 24 hours: Temp:  [97.7 F (36.5 C)-98.6 F (37 C)] 97.9 F (36.6 C) (03/18 0755) Pulse Rate:  [93-124] 106 (03/18 0755) Resp:  [16-24] 16 (03/18 0755) BP: (137-186)/(95-116) 175/104 (03/18 0755) SpO2:  [95 %-99 %] 98 % (03/18 0755) FiO2 (%):  [28 %-95 %] 28 % (03/17 2052)    Intake/Output from previous day: 03/17 0701 - 03/18 0700 In: 2756.6 [I.V.:2252; IV Piggyback:504.6] Out: 2250 [Urine:1700; Emesis/NG output:550] Intake/Output this shift: No intake/output data recorded.  General appearance: alert and cooperative Resp: rhonchi bilaterally Cardio: regular rate and rhythm GI: soft, moderate tenderness. Few bs. Wound clean  Lab Results:  Recent Labs    10/05/22 0237  WBC 14.0*  HGB 12.4*  HCT 36.9*  PLT 325   BMET Recent Labs    10/05/22 0237  NA 136  K 4.3  CL 104  CO2 22  GLUCOSE 163*  BUN 11  CREATININE 0.86  CALCIUM 7.8*   PT/INR No results for input(s): "LABPROT", "INR" in the last 72 hours. ABG No results for input(s): "PHART", "HCO3" in the last 72 hours.  Invalid input(s): "PCO2", "PO2"  Studies/Results: DG Abd 1 View  Addendum Date: 10/07/2022   ADDENDUM REPORT: 10/07/2022 00:53 ADDENDUM: Large quantity of free air in the right upper quadrant, markedly increased from the prior exam, surgical consultation is recommended. Critical Value/emergent results were called by telephone at the time of interpretation on 10/07/2022 at 12:49 a.m. to provider Erling Conte RN, who verbally acknowledged these results. Electronically Signed   By: Brett Fairy M.D.   On: 10/07/2022 00:53   Result Date: 10/07/2022 CLINICAL DATA:  NGT placement. EXAM: ABDOMEN - 1 VIEW COMPARISON:  10/05/2022, 10/04/2022. FINDINGS: Multiple loops of dilated small bowel are noted in the upper abdomen measuring up to 4.2 cm. There is free air underneath the  right diaphragm. An enteric tube terminates in the stomach. IMPRESSION: 1. Increased pneumoperitoneum under the right diaphragm as compared with previous exams. Clinical correlation is recommended to exclude perforated viscus. 2. Multiple distended loops of small bowel in the abdomen measuring up to 4.2 cm, possible ileus versus obstruction. 3. Enteric tube terminates in the stomach. Electronically Signed: By: Brett Fairy M.D. On: 10/07/2022 00:45   DG Abd 1 View  Result Date: 10/05/2022 CLINICAL DATA:  Encounter for nasogastric tube placement. EXAM: ABDOMEN - 1 VIEW COMPARISON:  Radiograph yesterday FINDINGS: The tip of the enteric tube is below the diaphragm in the stomach, the side port remains in the region of the distal esophagus. Advancement of at least 5 cm is recommended for optimal placement. Decreased gaseous gastric distension. Small volume of free air in the abdomen, previously reported as postsurgical. IMPRESSION: The tip of the enteric tube is below the diaphragm in the stomach, the side port remains in the region of the distal esophagus. Advancement of at least 5 cm is recommended for optimal placement. Electronically Signed   By: Keith Rake M.D.   On: 10/05/2022 16:41    Anti-infectives: Anti-infectives (From admission, onward)    Start     Dose/Rate Route Frequency Ordered Stop   10/04/22 1700  piperacillin-tazobactam (ZOSYN) IVPB 3.375 g        3.375 g 12.5 mL/hr over 240 Minutes Intravenous Every 8 hours 10/04/22 1613 10/09/22 1659   10/04/22 0615  ceFAZolin (ANCEF) IVPB 2g/100 mL premix  2 g 200 mL/hr over 30 Minutes Intravenous On call to O.R. 10/04/22 0601 10/04/22 1131       Assessment/Plan: s/p Procedure(s): EXPLORATORY LAPAROTOMY (N/A) DRAINAGE ABDOMIAL WALL ABSCESS (N/A) REMOVAL OF MESH (N/A) SEGMENTAL COLON RESECTION (N/A) LYSIS OF ADHESION (N/A) Continue ng and bowel rest Recheck wbc and lytes Free air on last nights abd xray but he is only pod 3  and seems to be getting better. Could be normal. Will monitor Continue zosyn for now OOB to chair today  LOS: 3 days    [name] 10/07/2022

## 2022-10-07 NOTE — Progress Notes (Signed)
Order received from Dr Thermon Leyland to advance NGT and obtain KUB. Patient tolerated okay. 3100mL bright green bile removed. Assistance received from [place], Therapist, sports.   Erling Conte, RN

## 2022-10-07 NOTE — Progress Notes (Addendum)
Patient in good spirits. Abdomen appears less distended, patient denies nausea. NGT in good placement with intermittent LWS. Bath and wound care completed. Abdominal wound is very dry, guaze is firm and stuck to wound bed. Will discuss with day shift nurse and MD if wound care can be changed to twice a day instead of once a day. Patient refused morning lab draw.  Erling Conte, RN

## 2022-10-08 LAB — CBC
HCT: 30.2 % — ABNORMAL LOW (ref 39.0–52.0)
Hemoglobin: 9.5 g/dL — ABNORMAL LOW (ref 13.0–17.0)
MCH: 31.9 pg (ref 26.0–34.0)
MCHC: 31.5 g/dL (ref 30.0–36.0)
MCV: 101.3 fL — ABNORMAL HIGH (ref 80.0–100.0)
Platelets: 275 10*3/uL (ref 150–400)
RBC: 2.98 MIL/uL — ABNORMAL LOW (ref 4.22–5.81)
RDW: 14.1 % (ref 11.5–15.5)
WBC: 10.3 10*3/uL (ref 4.0–10.5)
nRBC: 0 % (ref 0.0–0.2)

## 2022-10-08 LAB — BASIC METABOLIC PANEL
Anion gap: 11 (ref 5–15)
BUN: 7 mg/dL (ref 6–20)
CO2: 26 mmol/L (ref 22–32)
Calcium: 8.6 mg/dL — ABNORMAL LOW (ref 8.9–10.3)
Chloride: 106 mmol/L (ref 98–111)
Creatinine, Ser: 0.65 mg/dL (ref 0.61–1.24)
GFR, Estimated: >60 mL/min (ref >60)
Glucose, Bld: 99 mg/dL (ref 70–99)
Potassium: 3.4 mmol/L — ABNORMAL LOW (ref 3.5–5.1)
Sodium: 143 mmol/L (ref 135–145)

## 2022-10-08 MED ORDER — VANCOMYCIN HCL 1250 MG/250ML IV SOLN
1250.0000 mg | Freq: Once | INTRAVENOUS | Status: AC
  Administered 2022-10-08: 1250 mg via INTRAVENOUS
  Filled 2022-10-08: qty 250

## 2022-10-08 MED ORDER — KCL IN DEXTROSE-NACL 20-5-0.9 MEQ/L-%-% IV SOLN
INTRAVENOUS | Status: DC
  Filled 2022-10-08 (×6): qty 1000

## 2022-10-08 MED ORDER — VANCOMYCIN HCL IN DEXTROSE 1-5 GM/200ML-% IV SOLN
1000.0000 mg | Freq: Two times a day (BID) | INTRAVENOUS | Status: DC
  Administered 2022-10-09 – 2022-10-13 (×12): 1000 mg via INTRAVENOUS
  Filled 2022-10-08 (×13): qty 200

## 2022-10-08 NOTE — Plan of Care (Signed)

## 2022-10-08 NOTE — Progress Notes (Signed)
   10/08/22 1200  Mobility  Activity Stood at bedside  Level of Assistance Standby assist, set-up cues, supervision of patient - no hands on  Assistive Device Front wheel walker  Distance Ambulated (ft) 2 ft  Activity Response Tolerated well  Mobility Referral Yes  $Mobility charge 1 Mobility   Mobility Specialist Progress Note  Pre-Mobility: 120 HR During Mobility:130 HR Post-Mobility: 127 HR  Pt was in bed and agreeable. Completed X8 STS. Returned to bed w/ all needs met and call bell in reach  [place] Specialist  Please contact via Solicitor or Rehab office at [phone]

## 2022-10-08 NOTE — Progress Notes (Signed)
4 Days Post-Op   Subjective/Chief Complaint: No complaints. Feels better. Has passed a small amount of flatus   Objective: Vital signs in last 24 hours: Temp:  [98.3 F (36.8 C)-98.6 F (37 C)] 98.3 F (36.8 C) (03/19 0555) Pulse Rate:  [101-106] 101 (03/19 0555) Resp:  [16-18] 18 (03/18 1617) BP: (158-178)/(91-108) 176/93 (03/19 0555) SpO2:  [94 %-99 %] 99 % (03/19 0555)    Intake/Output from previous day: 03/18 0701 - 03/19 0700 In: 1803.9 [P.O.:60; I.V.:1438.6; IV Piggyback:305.3] Out: 3250 [Urine:850; Emesis/NG output:2400] Intake/Output this shift: No intake/output data recorded.  General appearance: alert and cooperative Resp: clear to auscultation bilaterally Cardio: regular rate and rhythm GI: soft, mild tenderness. Wound clean  Lab Results:  Recent Labs    10/07/22 0927 10/08/22 0040  WBC 11.4* 10.3  HGB 10.2* 9.5*  HCT 32.2* 30.2*  PLT 271 275   BMET Recent Labs    10/07/22 0927 10/08/22 0040  NA 139 143  K 3.8 3.4*  CL 107 106  CO2 22 26  GLUCOSE 93 99  BUN 7 7  CREATININE 0.62 0.65  CALCIUM 8.5* 8.6*   PT/INR No results for input(s): "LABPROT", "INR" in the last 72 hours. ABG No results for input(s): "PHART", "HCO3" in the last 72 hours.  Invalid input(s): "PCO2", "PO2"  Studies/Results: DG Abd 1 View  Addendum Date: 10/07/2022   ADDENDUM REPORT: 10/07/2022 00:53 ADDENDUM: Large quantity of free air in the right upper quadrant, markedly increased from the prior exam, surgical consultation is recommended. Critical Value/emergent results were called by telephone at the time of interpretation on 10/07/2022 at 12:49 a.m. to provider Erling Conte RN, who verbally acknowledged these results. Electronically Signed   By: Brett Fairy M.D.   On: 10/07/2022 00:53   Result Date: 10/07/2022 CLINICAL DATA:  NGT placement. EXAM: ABDOMEN - 1 VIEW COMPARISON:  10/05/2022, 10/04/2022. FINDINGS: Multiple loops of dilated small bowel are noted in the upper  abdomen measuring up to 4.2 cm. There is free air underneath the right diaphragm. An enteric tube terminates in the stomach. IMPRESSION: 1. Increased pneumoperitoneum under the right diaphragm as compared with previous exams. Clinical correlation is recommended to exclude perforated viscus. 2. Multiple distended loops of small bowel in the abdomen measuring up to 4.2 cm, possible ileus versus obstruction. 3. Enteric tube terminates in the stomach. Electronically Signed: By: Brett Fairy M.D. On: 10/07/2022 00:45    Anti-infectives: Anti-infectives (From admission, onward)    Start     Dose/Rate Route Frequency Ordered Stop   10/04/22 1700  piperacillin-tazobactam (ZOSYN) IVPB 3.375 g        3.375 g 12.5 mL/hr over 240 Minutes Intravenous Every 8 hours 10/04/22 1613 10/09/22 1659   10/04/22 0615  ceFAZolin (ANCEF) IVPB 2g/100 mL premix        2 g 200 mL/hr over 30 Minutes Intravenous On call to O.R. 10/04/22 0601 10/04/22 1131       Assessment/Plan: s/p Procedure(s): EXPLORATORY LAPAROTOMY (N/A) DRAINAGE ABDOMIAL WALL ABSCESS (N/A) REMOVAL OF MESH (N/A) SEGMENTAL COLON RESECTION (N/A) LYSIS OF ADHESION (N/A) Will allow ice chips today D/c foley OOB to chair Continue ng for now since abd is still distended Continue IV zosyn POD 4 Wbc normal HTN IV lopressor prn Continue dressing changes  LOS: 4 days    [name] 10/08/2022

## 2022-10-08 NOTE — Progress Notes (Signed)
Initial Nutrition Assessment  DOCUMENTATION CODES:   Non-severe (moderate) malnutrition in context of social or environmental circumstances  INTERVENTION:   - Given malnutrition, recommend initiation of TPN if unable to advance diet within next 24 hours  - RD will monitor for diet advancement and add oral nutrition supplements as appropriate  NUTRITION DIAGNOSIS:   Moderate Malnutrition related to social / environmental circumstances as evidenced by mild fat depletion, moderate muscle depletion.  GOAL:   Patient will meet greater than or equal to 90% of their needs  MONITOR:   Diet advancement, Labs, Weight trends, I & O's  REASON FOR ASSESSMENT:   Malnutrition Screening Tool    ASSESSMENT:   57 year old male who presented on 3/15 with abdominal wall abscess. PMH of GERD, anxiety, ETOH abuse, depression, HTN, bipolar disorder, hx colostomy and colostomy takedown.  03/15 - s/p ex lap, drainage of abdominal wall abscess, removal of mesh, segmental colon resection, lysis of adhesion; NG tube placed to LIWS  NG tube remains in place to LIWS. Per abdominal x-ray yesterday, pt with multiple distended loops of small bowel in the abdomen with concern for ileus versus obstruction. Output in cannister and tubing is yellow.  Spoke with pt at bedside. Pt reports just having had a bowel movement prior to RD visit. He states that his stomach still feels distended but potentially a little less distended after having a BM. Pt reports that he has been able to have some ice chips today and that he is doing well with these but hopes to have more PO options soon. Pt shares that he had nausea this AM but none currently. He is eager to have NG tube removed and diet advanced.  Pt reports that his appetite is on and off at home. Pt does not usually eat large meals but rather snacks/grazes throughout the day. Some days, pt will eat a lot while other days, he will not. Pt shares that his weight  fluctuates related to how much he's eating but typically stays between 160-170 lbs. He denies any recent significant weight loss. Pt also denies issues with N/V at baseline.  Pt amenable to oral nutrition supplements after diet is advanced. He enjoys [employer] in the chocolate flavor. Discussed both clear liquid and full liquid supplement options.  Reviewed weight history in chart. Pt with a 5.9 kg weight loss since 12/31/21. However, weight on admission appears to be stated rather than measured. If accurate, this is a 7.5% weight loss in 9 months which is not clinically significant for timeframe but is concerning given progressive nature of weight loss.  Based on NFPE, pt meets criteria for moderate malnutrition. RD recommends initiation of TPN within next 24 hours if pt unable to have diet advanced. Will monitor for diet advancement and add oral nutrition supplements as appropriate.  Medications reviewed and include: IV protonix, IV abx IVF: D5 and NS with KCl @ 75 ml/hr  Labs reviewed: potassium 3.4, hemoglobin 9.5  UOP: 850 ml x 24 hours 18 Fr NG tube: 2400 ml x 24 hours I/O's: -2.3 L since admit  NUTRITION - FOCUSED PHYSICAL EXAM:  Flowsheet Row Most Recent Value  Orbital Region Mild depletion  Upper Arm Region Moderate depletion  Thoracic and Lumbar Region Mild depletion  Buccal Region Mild depletion  Temple Region Mild depletion  Clavicle Bone Region Moderate depletion  Clavicle and Acromion Bone Region Moderate depletion  Scapular Bone Region Mild depletion  Dorsal Hand Mild depletion  Patellar Region Mild depletion  Anterior Thigh Region Moderate depletion  Posterior Calf Region Mild depletion  Edema (RD Assessment) None  Hair Reviewed  Eyes Reviewed  Mouth Reviewed  Skin Reviewed  Nails Reviewed       Diet Order:   Diet Order             Diet NPO time specified  Diet effective now                   EDUCATION NEEDS:   Education needs  have been addressed  Skin:  Skin Assessment: Skin Integrity Issues: Incisions: closed abdomen  Last BM:  no documented BM  Height:   Ht Readings from Last 1 Encounters:  10/04/22 5\' 9"  (1.753 m)    Weight:   Wt Readings from Last 1 Encounters:  10/04/22 72.6 kg   BMI:  Body mass index is 23.63 kg/m.  Estimated Nutritional Needs:   Kcal:  2000-2200  Protein:  100-120 grams  Fluid:  >2.0 L    Gustavus Bryant, MS, RD, LDN Inpatient Clinical Dietitian Please see [facility] for contact information.

## 2022-10-08 NOTE — Progress Notes (Signed)
Pharmacy Antibiotic Note  Timothy [name] is a 57 y.o. male admitted on 10/04/2022 with abdominal wall abscess. Pharmacy has been consulted for vancomycin dosing. Noted culture from OR on 3/15 now with MRSA growing. Scr -0.65 with CrCl > 100 ml/min.   Plan: Vancomycin 1250 mg x 1 then 1000 mg every 12 hours (Predicted AUC 513 with SCr 1)  Per Dr. Marlou Starks, continue Zosyn for the next day or 2 due to open colon during surgery  When ready for discharge, MRSA is sensitive to doxycycline and could consider this as a po option to finish therapy   Height: 5\' 9"  (175.3 cm) Weight: 72.6 kg (160 lb) IBW/kg (Calculated) : 70.7  Temp (24hrs), Avg:98.3 F (36.8 C), Min:98 F (36.7 C), Max:98.6 F (37 C)  Recent Labs  Lab 10/05/22 0237 10/07/22 0927 10/08/22 0040  WBC 14.0* 11.4* 10.3  CREATININE 0.86 0.62 0.65    Estimated Creatinine Clearance: 105.6 mL/min (by C-G formula based on SCr of 0.65 mg/dL).    Allergies  Allergen Reactions   Ambien [Zolpidem Tartrate] Other (See Comments)    Caused agitation of mood.    Bactroban [Mupirocin Calcium] Swelling    Caused infection at a surgical site   Darvocet [Propoxyphene N-Acetaminophen] Hives and Itching   Zestril [Lisinopril] Cough   Morphine And Related Itching      Thank you for allowing pharmacy to be a part of this patient's care.  Jimmy Footman, PharmD, BCPS, BCIDP Infectious Diseases Clinical Pharmacist Phone: [phone] 10/08/2022 11:41 AM

## 2022-10-08 NOTE — TOC Initial Note (Addendum)
Transition of Care (TOC) - Initial/Assessment Note   Spoke to patient at bedside. Patient from home with son.   Confirmed face sheet information.   Patient has walker and NEB machine at home already.   Patient has done wound packing at home before. Patient aware he will be educated on wound care prior to discharge. HHRN will not make daily visits , maybe weekly visits for wound checks.  Patient voices understanding.   He has had home health in the past.  He has no preference in agency.  Will need MD order and face to face. Entered for [employer] with [facility] accepted referral for [facility]  Patient Details  Name: Timothy [name] MRN: [id number] Date of Birth: 08-Mar-1966  Transition of Care [facility]) CM/SW Contact:    Marilu Favre, RN Phone Number: 10/08/2022, 11:20 AM  Clinical Narrative:                   Expected Discharge Plan: [place] Barriers to Discharge: Continued Medical Work up   Patient Goals and CMS Choice Patient states their goals for this hospitalization and ongoing recovery are:: to return to home CMS Medicare.gov Compare Post Acute Care list provided to:: Patient Choice offered to / list presented to : Patient [place] ownership interest in [facility].provided to:: Patient    Expected Discharge Plan and Services   Discharge Planning Services: CM Consult Post Acute Care Choice: [place] arrangements for the past 2 months: Single Family Home                 DME Arranged: N/A DME Agency: NA       HH Arranged: RN          Prior Living Arrangements/Services Living arrangements for the past 2 months: Single Family Home Lives with:: Adult Children Patient language and need for interpreter reviewed:: Yes Do you feel safe going back to the place where you live?: Yes      Need for Family Participation in Patient Care: Yes (Comment) Care giver support system in place?: Yes (comment) Current home  services: DME Criminal Activity/Legal Involvement Pertinent to Current Situation/Hospitalization: No - Comment as needed  Activities of Daily Living Home Assistive Devices/Equipment: None ADL Screening (condition at time of admission) Patient's cognitive ability adequate to safely complete daily activities?: Yes Is the patient deaf or have difficulty hearing?: No Does the patient have difficulty seeing, even when wearing glasses/contacts?: No Does the patient have difficulty concentrating, remembering, or making decisions?: No Patient able to express need for assistance with ADLs?: Yes Does the patient have difficulty dressing or bathing?: Yes Independently performs ADLs?: No Communication: Needs assistance Does the patient have difficulty walking or climbing stairs?: No Weakness of Legs: None Weakness of Arms/Hands: None  Permission Sought/Granted   Permission granted to share information with : No              Emotional Assessment Appearance:: Appears stated age Attitude/Demeanor/Rapport: Engaged Affect (typically observed): Accepting Orientation: : Oriented to Self, Oriented to Place, Oriented to  Time, Oriented to Situation Alcohol / Substance Use: Not Applicable Psych Involvement: No (comment)  Admission diagnosis:  Abdominal wall abscess [L02.211] Patient Active Problem List   Diagnosis Date Noted   Abdominal wall abscess 10/04/2022   Suspected sleep apnea 06/24/2022   Hematemesis with nausea 07/25/2021   Depression, recurrent ([place]) 05/08/2021   Lung mass 05/19/2020   Chronic obstructive pulmonary disease ([place]) 07/07/2017  Mood disorder ([place]) 04/13/2017   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[place]) 03/31/2017   Cocaine use with cocaine-induced mood disorder ([place]) 03/31/2017   Chronic pain syndrome 02/14/2017   PUD (peptic ulcer disease) 01/16/2017   Chronic abdominal wound infection 08/16/2016   Skin ulcer of abdominal wall ([place]) 12/27/2015   COPD  exacerbation ([place]) 07/12/2015   Akathisia 03/02/2013   Essential hypertension, benign 06/28/2012   Bipolar disorder ([place]) 06/02/2012   Insomnia 04/27/2012   Panic anxiety syndrome 03/21/2012   Alcohol abuse 10/29/2011   Tobacco use 10/16/2011   PCP:  Gerrit Heck, MD Pharmacy:   [place], [place] [address] Phone: [phone] Fax: [phone]     Social Determinants of Health (SDOH) Social History: SDOH Screenings   Food Insecurity: No Food Insecurity (10/04/2022)  Recent Concern: [place] Present (07/31/2022)  Housing: Low Risk  (10/04/2022)  Transportation Needs: No Transportation Needs (10/04/2022)  Utilities: Not At Risk (10/04/2022)  Recent Concern: Utilities - At Risk (07/31/2022)  Alcohol Screen: Medium Risk (07/17/2022)  Depression (PHQ2-9): High Risk (09/02/2022)  Financial Resource Strain: Medium Risk (07/17/2022)  Physical Activity: Inactive (07/17/2022)  Social Connections: Socially Isolated (07/17/2022)  Stress: Stress Concern Present (07/17/2022)  Tobacco Use: High Risk (10/07/2022)   SDOH Interventions:     Readmission Risk Interventions     No data to display

## 2022-10-08 NOTE — Plan of Care (Signed)
  Problem: Education: Goal: Knowledge of General Education information will improve Description: Including pain rating scale, medication(s)/side effects and non-pharmacologic comfort measures 10/08/2022 0134 by Jule Ser, RN Outcome: Progressing 10/08/2022 0133 by Jule Ser, RN Outcome: Progressing   Problem: Health Behavior/Discharge Planning: Goal: Ability to manage health-related needs will improve 10/08/2022 0134 by Jule Ser, RN Outcome: Progressing 10/08/2022 0133 by Jule Ser, RN Outcome: Progressing   Problem: Clinical Measurements: Goal: Ability to maintain clinical measurements within normal limits will improve 10/08/2022 0134 by Jule Ser, RN Outcome: Progressing 10/08/2022 0133 by Jule Ser, RN Outcome: Progressing Goal: Will remain free from infection 10/08/2022 0134 by Jule Ser, RN Outcome: Progressing 10/08/2022 0133 by Jule Ser, RN Outcome: Progressing Goal: Diagnostic test results will improve 10/08/2022 0134 by Jule Ser, RN Outcome: Progressing 10/08/2022 0133 by Jule Ser, RN Outcome: Progressing Goal: Respiratory complications will improve 10/08/2022 0134 by Jule Ser, RN Outcome: Progressing 10/08/2022 0133 by Jule Ser, RN Outcome: Progressing Goal: Cardiovascular complication will be avoided 10/08/2022 0134 by Jule Ser, RN Outcome: Progressing 10/08/2022 0133 by Jule Ser, RN Outcome: Progressing   Problem: Activity: Goal: Risk for activity intolerance will decrease 10/08/2022 0134 by Jule Ser, RN Outcome: Progressing 10/08/2022 0133 by Jule Ser, RN Outcome: Progressing   Problem: Nutrition: Goal: Adequate nutrition will be maintained 10/08/2022 0134 by Jule Ser, RN Outcome: Progressing 10/08/2022 0133 by Jule Ser, RN Outcome: Progressing   Problem: Coping: Goal: Level of anxiety will decrease 10/08/2022 0134 by Jule Ser, RN Outcome: Progressing 10/08/2022 0133 by Jule Ser, RN Outcome: Progressing   Problem: Elimination: Goal: Will not experience complications related to bowel motility 10/08/2022 0134 by Jule Ser, RN Outcome: Progressing 10/08/2022 0133 by Jule Ser, RN Outcome: Progressing Goal: Will not experience complications related to urinary retention 10/08/2022 0134 by Jule Ser, RN Outcome: Progressing 10/08/2022 0133 by Jule Ser, RN Outcome: Progressing   Problem: Pain Managment: Goal: General experience of comfort will improve 10/08/2022 0134 by Jule Ser, RN Outcome: Progressing 10/08/2022 0133 by Jule Ser, RN Outcome: Progressing   Problem: Safety: Goal: Ability to remain free from injury will improve 10/08/2022 0134 by Jule Ser, RN Outcome: Progressing 10/08/2022 0133 by Jule Ser, RN Outcome: Progressing   Problem: Skin Integrity: Goal: Risk for impaired skin integrity will decrease 10/08/2022 0134 by Jule Ser, RN Outcome: Progressing 10/08/2022 0133 by Jule Ser, RN Outcome: Progressing

## 2022-10-09 DIAGNOSIS — E440 Moderate protein-calorie malnutrition: Secondary | ICD-10-CM | POA: Insufficient documentation

## 2022-10-09 LAB — AEROBIC/ANAEROBIC CULTURE W GRAM STAIN (SURGICAL/DEEP WOUND)

## 2022-10-09 LAB — CBC
HCT: 31.1 % — ABNORMAL LOW (ref 39.0–52.0)
Hemoglobin: 9.7 g/dL — ABNORMAL LOW (ref 13.0–17.0)
MCH: 31.6 pg (ref 26.0–34.0)
MCHC: 31.2 g/dL (ref 30.0–36.0)
MCV: 101.3 fL — ABNORMAL HIGH (ref 80.0–100.0)
Platelets: 296 10*3/uL (ref 150–400)
RBC: 3.07 MIL/uL — ABNORMAL LOW (ref 4.22–5.81)
RDW: 13.8 % (ref 11.5–15.5)
WBC: 10.6 10*3/uL — ABNORMAL HIGH (ref 4.0–10.5)
nRBC: 0 % (ref 0.0–0.2)

## 2022-10-09 LAB — BASIC METABOLIC PANEL
Anion gap: 8 (ref 5–15)
BUN: <5 mg/dL — ABNORMAL LOW (ref 6–20)
CO2: 28 mmol/L (ref 22–32)
Calcium: 8.6 mg/dL — ABNORMAL LOW (ref 8.9–10.3)
Chloride: 107 mmol/L (ref 98–111)
Creatinine, Ser: 0.43 mg/dL — ABNORMAL LOW (ref 0.61–1.24)
GFR, Estimated: >60 mL/min (ref >60)
Glucose, Bld: 131 mg/dL — ABNORMAL HIGH (ref 70–99)
Potassium: 3.2 mmol/L — ABNORMAL LOW (ref 3.5–5.1)
Sodium: 143 mmol/L (ref 135–145)

## 2022-10-09 MED ORDER — POTASSIUM CHLORIDE 10 MEQ/100ML IV SOLN
10.0000 meq | INTRAVENOUS | Status: AC
  Administered 2022-10-09 (×4): 10 meq via INTRAVENOUS
  Filled 2022-10-09 (×4): qty 100

## 2022-10-09 NOTE — Progress Notes (Signed)
Several times throughout shift patient disconnected and turned off his IV despite being educated on why he should not. Patient also continued to remove his oxygen several times and was educated on how his oxygen level and respiratory rate play a role in his PCA medication being available to him.  Despite his stated understanding the patient continued to be noncompliant with these things throughtout shift.

## 2022-10-09 NOTE — Progress Notes (Signed)
5 Days Post-Op   Subjective/Chief Complaint: He denies abdominal pain Had a BM and is passing flatus   Objective: Vital signs in last 24 hours: Temp:  [98.1 F (36.7 C)-98.5 F (36.9 C)] 98.1 F (36.7 C) (03/20 0927) Pulse Rate:  [103-110] 103 (03/20 0927) Resp:  [16-18] 18 (03/20 0927) BP: (167-183)/(100-113) 167/113 (03/20 0927) SpO2:  [94 %-98 %] 98 % (03/20 0927)    Intake/Output from previous day: 03/19 0701 - 03/20 0700 In: 170 [P.O.:170] Out: 1650 [Urine:500; Emesis/NG output:1150] Intake/Output this shift: Total I/O In: 50 [P.O.:50] Out: -   Exam: Abdomen soft, mildly full Almost non-tender with no peritoneal signs Mild line open wound clean  Lab Results:  Recent Labs    10/08/22 0040 10/09/22 0119  WBC 10.3 10.6*  HGB 9.5* 9.7*  HCT 30.2* 31.1*  PLT 275 296   BMET Recent Labs    10/08/22 0040 10/09/22 0119  NA 143 143  K 3.4* 3.2*  CL 106 107  CO2 26 28  GLUCOSE 99 131*  BUN 7 <5*  CREATININE 0.65 0.43*  CALCIUM 8.6* 8.6*   PT/INR No results for input(s): "LABPROT", "INR" in the last 72 hours. ABG No results for input(s): "PHART", "HCO3" in the last 72 hours.  Invalid input(s): "PCO2", "PO2"  Studies/Results: No results found.  Anti-infectives: Anti-infectives (From admission, onward)    Start     Dose/Rate Route Frequency Ordered Stop   10/09/22 0045  vancomycin (VANCOCIN) IVPB 1000 mg/200 mL premix        1,000 mg 200 mL/hr over 60 Minutes Intravenous Every 12 hours 10/08/22 1158     10/08/22 1245  vancomycin (VANCOREADY) IVPB 1250 mg/250 mL        1,250 mg 166.7 mL/hr over 90 Minutes Intravenous  Once 10/08/22 1158 10/08/22 1639   10/04/22 1700  piperacillin-tazobactam (ZOSYN) IVPB 3.375 g        3.375 g 12.5 mL/hr over 240 Minutes Intravenous Every 8 hours 10/04/22 1613 10/09/22 1659   10/04/22 0615  ceFAZolin (ANCEF) IVPB 2g/100 mL premix        2 g 200 mL/hr over 30 Minutes Intravenous On call to O.R. 10/04/22 0601  10/04/22 1131       Assessment/Plan: s/p Procedure(s): EXPLORATORY LAPAROTOMY (N/A) DRAINAGE ABDOMIAL WALL ABSCESS (N/A) REMOVAL OF MESH (N/A) SEGMENTAL COLON RESECTION (N/A) LYSIS OF ADHESION (N/A)  Given exam and bm's, will clamp NG and try clear liquids Repeat CBC in the morning Continue antibiotics and wound care   LOS: 5 days    Coralie Keens 10/09/2022

## 2022-10-10 LAB — CBC
HCT: 34.9 % — ABNORMAL LOW (ref 39.0–52.0)
Hemoglobin: 11.2 g/dL — ABNORMAL LOW (ref 13.0–17.0)
MCH: 31.8 pg (ref 26.0–34.0)
MCHC: 32.1 g/dL (ref 30.0–36.0)
MCV: 99.1 fL (ref 80.0–100.0)
Platelets: 360 10*3/uL (ref 150–400)
RBC: 3.52 MIL/uL — ABNORMAL LOW (ref 4.22–5.81)
RDW: 13.8 % (ref 11.5–15.5)
WBC: 13.9 10*3/uL — ABNORMAL HIGH (ref 4.0–10.5)
nRBC: 0 % (ref 0.0–0.2)

## 2022-10-10 LAB — BASIC METABOLIC PANEL
Anion gap: 11 (ref 5–15)
BUN: <5 mg/dL — ABNORMAL LOW (ref 6–20)
CO2: 26 mmol/L (ref 22–32)
Calcium: 8.8 mg/dL — ABNORMAL LOW (ref 8.9–10.3)
Chloride: 102 mmol/L (ref 98–111)
Creatinine, Ser: 0.53 mg/dL — ABNORMAL LOW (ref 0.61–1.24)
GFR, Estimated: >60 mL/min (ref >60)
Glucose, Bld: 105 mg/dL — ABNORMAL HIGH (ref 70–99)
Potassium: 3.2 mmol/L — ABNORMAL LOW (ref 3.5–5.1)
Sodium: 139 mmol/L (ref 135–145)

## 2022-10-10 LAB — MAGNESIUM: Magnesium: 1.3 mg/dL — ABNORMAL LOW (ref 1.7–2.4)

## 2022-10-10 MED ORDER — POTASSIUM CHLORIDE 10 MEQ/100ML IV SOLN
10.0000 meq | INTRAVENOUS | Status: AC
  Administered 2022-10-10 (×4): 10 meq via INTRAVENOUS
  Filled 2022-10-10 (×4): qty 100

## 2022-10-10 MED ORDER — METHOCARBAMOL 500 MG PO TABS
500.0000 mg | ORAL_TABLET | Freq: Four times a day (QID) | ORAL | Status: DC
  Filled 2022-10-10: qty 1

## 2022-10-10 MED ORDER — MAGNESIUM SULFATE 2 GM/50ML IV SOLN
2.0000 g | Freq: Once | INTRAVENOUS | Status: AC
  Administered 2022-10-10: 2 g via INTRAVENOUS
  Filled 2022-10-10: qty 50

## 2022-10-10 MED ORDER — METOPROLOL TARTRATE 25 MG PO TABS: 25.0000 mg | ORAL_TABLET | Freq: Two times a day (BID) | ORAL | Status: DC

## 2022-10-10 MED ORDER — PANTOPRAZOLE SODIUM 40 MG PO TBEC: 40.0000 mg | DELAYED_RELEASE_TABLET | Freq: Every day | ORAL | Status: DC

## 2022-10-10 MED ORDER — POTASSIUM CHLORIDE CRYS ER 20 MEQ PO TBCR
40.0000 meq | EXTENDED_RELEASE_TABLET | Freq: Once | ORAL | Status: AC
  Administered 2022-10-10: 40 meq via ORAL
  Filled 2022-10-10: qty 2

## 2022-10-10 MED ORDER — METHOCARBAMOL 1000 MG/10ML IJ SOLN
500.0000 mg | Freq: Four times a day (QID) | INTRAVENOUS | Status: DC
  Administered 2022-10-10 – 2022-10-13 (×13): 500 mg via INTRAVENOUS
  Filled 2022-10-10 (×4): qty 500
  Filled 2022-10-10 (×3): qty 5
  Filled 2022-10-10 (×8): qty 500

## 2022-10-10 MED ORDER — PIPERACILLIN-TAZOBACTAM 3.375 G IVPB
3.3750 g | Freq: Three times a day (TID) | INTRAVENOUS | Status: AC
  Administered 2022-10-10 – 2022-10-15 (×14): 3.375 g via INTRAVENOUS
  Filled 2022-10-10 (×14): qty 50

## 2022-10-10 MED ORDER — PANTOPRAZOLE SODIUM 40 MG IV SOLR
40.0000 mg | INTRAVENOUS | Status: DC
  Administered 2022-10-10 – 2022-10-15 (×6): 40 mg via INTRAVENOUS
  Filled 2022-10-10 (×6): qty 10

## 2022-10-10 MED ORDER — LOSARTAN POTASSIUM 50 MG PO TABS
100.0000 mg | ORAL_TABLET | Freq: Every day | ORAL | Status: DC
  Administered 2022-10-10 – 2022-10-13 (×4): 100 mg via ORAL
  Filled 2022-10-10 (×4): qty 2

## 2022-10-10 NOTE — Progress Notes (Signed)
6 Days Post-Op   Subjective/Chief Complaint: No complaints. Passing flatus and having bm's   Objective: Vital signs in last 24 hours: Temp:  [97.8 F (36.6 C)-99 F (37.2 C)] 98 F (36.7 C) (03/21 0441) Pulse Rate:  [103-124] 108 (03/21 0441) Resp:  [18-19] 18 (03/21 0441) BP: (153-171)/(101-113) 167/101 (03/21 0441) SpO2:  [96 %-100 %] 100 % (03/21 0441) FiO2 (%):  [42 %] 42 % (03/20 1541)    Intake/Output from previous day: 03/20 0701 - 03/21 0700 In: 50 [P.O.:50] Out: 1025 [Urine:1025] Intake/Output this shift: No intake/output data recorded.  General appearance: alert and cooperative Resp: rhonchi bilaterally Cardio: regular rate and rhythm GI: soft, mild tenderness. Good bs. Wound clean  Lab Results:  Recent Labs    10/09/22 0119 10/10/22 0323  WBC 10.6* 13.9*  HGB 9.7* 11.2*  HCT 31.1* 34.9*  PLT 296 360   BMET Recent Labs    10/09/22 0119 10/10/22 0323  NA 143 139  K 3.2* 3.2*  CL 107 102  CO2 28 26  GLUCOSE 131* 105*  BUN <5* <5*  CREATININE 0.43* 0.53*  CALCIUM 8.6* 8.8*   PT/INR No results for input(s): "LABPROT", "INR" in the last 72 hours. ABG No results for input(s): "PHART", "HCO3" in the last 72 hours.  Invalid input(s): "PCO2", "PO2"  Studies/Results: No results found.  Anti-infectives: Anti-infectives (From admission, onward)    Start     Dose/Rate Route Frequency Ordered Stop   10/10/22 0815  piperacillin-tazobactam (ZOSYN) IVPB 3.375 g        3.375 g 12.5 mL/hr over 240 Minutes Intravenous Every 8 hours 10/10/22 0728 10/15/22 0559   10/09/22 0045  vancomycin (VANCOCIN) IVPB 1000 mg/200 mL premix        1,000 mg 200 mL/hr over 60 Minutes Intravenous Every 12 hours 10/08/22 1158     10/08/22 1245  vancomycin (VANCOREADY) IVPB 1250 mg/250 mL        1,250 mg 166.7 mL/hr over 90 Minutes Intravenous  Once 10/08/22 1158 10/08/22 1639   10/04/22 1700  piperacillin-tazobactam (ZOSYN) IVPB 3.375 g        3.375 g 12.5 mL/hr over  240 Minutes Intravenous Every 8 hours 10/04/22 1613 10/09/22 1416   10/04/22 0615  ceFAZolin (ANCEF) IVPB 2g/100 mL premix        2 g 200 mL/hr over 30 Minutes Intravenous On call to O.R. 10/04/22 0601 10/04/22 1131       Assessment/Plan: s/p Procedure(s): EXPLORATORY LAPAROTOMY (N/A) DRAINAGE ABDOMIAL WALL ABSCESS (N/A) REMOVAL OF MESH (N/A) SEGMENTAL COLON RESECTION (N/A) LYSIS OF ADHESION (N/A) Advance diet. Allow fulls today Wbc up to 13. Will restart zosyn Replace mag Continue dressing changes Vanc for staph from wound Ambulate POD 6 D/c ng  LOS: 6 days    Timothy [name] 10/10/2022

## 2022-10-10 NOTE — Plan of Care (Signed)

## 2022-10-11 ENCOUNTER — Inpatient Hospital Stay (HOSPITAL_COMMUNITY): Payer: Medicare Other

## 2022-10-11 LAB — BASIC METABOLIC PANEL
Anion gap: 10 (ref 5–15)
BUN: <5 mg/dL — ABNORMAL LOW (ref 6–20)
CO2: 25 mmol/L (ref 22–32)
Calcium: 8.3 mg/dL — ABNORMAL LOW (ref 8.9–10.3)
Chloride: 102 mmol/L (ref 98–111)
Creatinine, Ser: 0.44 mg/dL — ABNORMAL LOW (ref 0.61–1.24)
GFR, Estimated: >60 mL/min (ref >60)
Glucose, Bld: 99 mg/dL (ref 70–99)
Potassium: 3.4 mmol/L — ABNORMAL LOW (ref 3.5–5.1)
Sodium: 137 mmol/L (ref 135–145)

## 2022-10-11 LAB — MAGNESIUM: Magnesium: 1.5 mg/dL — ABNORMAL LOW (ref 1.7–2.4)

## 2022-10-11 MED ORDER — MAGNESIUM SULFATE 2 GM/50ML IV SOLN
2.0000 g | Freq: Once | INTRAVENOUS | Status: AC
  Administered 2022-10-11: 2 g via INTRAVENOUS
  Filled 2022-10-11: qty 50

## 2022-10-11 MED ORDER — POTASSIUM CHLORIDE 10 MEQ/100ML IV SOLN
10.0000 meq | INTRAVENOUS | Status: AC
  Administered 2022-10-11 (×4): 10 meq via INTRAVENOUS
  Filled 2022-10-11 (×4): qty 100

## 2022-10-11 MED ORDER — ALUM & MAG HYDROXIDE-SIMETH 200-200-20 MG/5ML PO SUSP
30.0000 mL | ORAL | Status: DC | PRN
  Administered 2022-10-11: 30 mL via ORAL
  Filled 2022-10-11: qty 30

## 2022-10-11 MED ORDER — POTASSIUM CHLORIDE CRYS ER 20 MEQ PO TBCR
40.0000 meq | EXTENDED_RELEASE_TABLET | Freq: Once | ORAL | Status: AC
  Administered 2022-10-11: 40 meq via ORAL
  Filled 2022-10-11: qty 2

## 2022-10-11 MED ORDER — DOCUSATE SODIUM 100 MG PO CAPS
100.0000 mg | ORAL_CAPSULE | Freq: Two times a day (BID) | ORAL | Status: DC
  Administered 2022-10-11 – 2022-10-13 (×6): 100 mg via ORAL
  Filled 2022-10-11 (×7): qty 1

## 2022-10-11 NOTE — Progress Notes (Signed)
7 Days Post-Op   Subjective/Chief Complaint: Feels good. No complaints. Passing flatus. No bm yesterday   Objective: Vital signs in last 24 hours: Temp:  [98 F (36.7 C)-98.9 F (37.2 C)] 98 F (36.7 C) (03/22 0414) Pulse Rate:  [97-121] 97 (03/22 0414) Resp:  [17-22] 18 (03/22 0414) BP: (133-151)/(83-94) 133/83 (03/22 0414) SpO2:  [97 %-100 %] 100 % (03/22 0414) FiO2 (%):  [44 %] 44 % (03/22 0406) Last BM Date : 10/10/22  Intake/Output from previous day: 03/21 0701 - 03/22 0700 In: 6635.8 [P.O.:960; I.V.:3413.6; IV Piggyback:2262.3] Out: 1175 [Urine:1175] Intake/Output this shift: No intake/output data recorded.  General appearance: alert and cooperative Resp: rhonchi bilaterally Cardio: regular rate and rhythm GI: soft, minimal tenderness. Wound clean. Good bs  Lab Results:  Recent Labs    10/09/22 0119 10/10/22 0323  WBC 10.6* 13.9*  HGB 9.7* 11.2*  HCT 31.1* 34.9*  PLT 296 360   BMET Recent Labs    10/10/22 0323 10/11/22 0353  NA 139 137  K 3.2* 3.4*  CL 102 102  CO2 26 25  GLUCOSE 105* 99  BUN <5* <5*  CREATININE 0.53* 0.44*  CALCIUM 8.8* 8.3*   PT/INR No results for input(s): "LABPROT", "INR" in the last 72 hours. ABG No results for input(s): "PHART", "HCO3" in the last 72 hours.  Invalid input(s): "PCO2", "PO2"  Studies/Results: No results found.  Anti-infectives: Anti-infectives (From admission, onward)    Start     Dose/Rate Route Frequency Ordered Stop   10/10/22 0800  piperacillin-tazobactam (ZOSYN) IVPB 3.375 g        3.375 g 12.5 mL/hr over 240 Minutes Intravenous Every 8 hours 10/10/22 0728 10/15/22 0759   10/09/22 0045  vancomycin (VANCOCIN) IVPB 1000 mg/200 mL premix        1,000 mg 200 mL/hr over 60 Minutes Intravenous Every 12 hours 10/08/22 1158     10/08/22 1245  vancomycin (VANCOREADY) IVPB 1250 mg/250 mL        1,250 mg 166.7 mL/hr over 90 Minutes Intravenous  Once 10/08/22 1158 10/08/22 1639   10/04/22 1700   piperacillin-tazobactam (ZOSYN) IVPB 3.375 g        3.375 g 12.5 mL/hr over 240 Minutes Intravenous Every 8 hours 10/04/22 1613 10/09/22 1416   10/04/22 0615  ceFAZolin (ANCEF) IVPB 2g/100 mL premix        2 g 200 mL/hr over 30 Minutes Intravenous On call to O.R. 10/04/22 0601 10/04/22 1131       Assessment/Plan: s/p Procedure(s): EXPLORATORY LAPAROTOMY (N/A) DRAINAGE ABDOMIAL WALL ABSCESS (N/A) REMOVAL OF MESH (N/A) SEGMENTAL COLON RESECTION (N/A) LYSIS OF ADHESION (N/A) Continue fulls until abd distension improves Add colace Continue IV zosyn and vanc. Wbc 13.9 Continue dressing changes Will check cxr for possible pneumonia given cough Ambulate POD 7  LOS: 7 days    [name] 10/11/2022

## 2022-10-11 NOTE — Progress Notes (Signed)
MEWS Progress Note  Patient Details Name: Timothy [name] MRN: [id number] DOB: 1966/06/11 Today's Date: 10/11/2022   MEWS Flowsheet Documentation:  Assess: MEWS Score Temp: 98.3 F (36.8 C) BP: (!) 140/88 MAP (mmHg): 105 Pulse Rate: 98 ECG Heart Rate: 97 Resp: 18 Level of Consciousness: Alert SpO2: 100 % O2 Device: Nasal Cannula Patient Activity (if Appropriate): In bed O2 Flow Rate (L/min): 2.5 L/min FiO2 (%): 44 % Assess: MEWS Score MEWS Temp: 0 MEWS Systolic: 0 MEWS Pulse: 0 MEWS RR: 0 MEWS LOC: 0 MEWS Score: 0 MEWS Score Color: Green Assess: SIRS CRITERIA SIRS Temperature : 0 SIRS Respirations : 0 SIRS Pulse: 1 SIRS WBC: 0 SIRS Score Sum : 1 SIRS Temperature : 0 SIRS Pulse: 1 SIRS Respirations : 0 SIRS WBC: 0 SIRS Score Sum : 1 Assess: if the MEWS score is Yellow or Red Were vital signs taken at a resting state?: Yes Focused Assessment: No change from prior assessment Does the patient meet 2 or more of the SIRS criteria?: No Does the patient have a confirmed or suspected source of infection?: No Provider and Rapid Response Notified?: No MEWS guidelines implemented : No, vital signs rechecked Notify: Charge Nurse/RN Name of Charge Nurse/RN Notified: Market researcher Name/Title: Georganna Skeans, MD Date Provider Notified: 10/05/22 Time Provider Notified: [phone] Method of Notification: Page Notification Reason: Other (Comment) ([place]-Elevated heart rate) Provider response: No new orders Date of Provider Response: 10/05/22 Time of Provider Response: [id number]      Verdene Rio 10/11/2022, 12:28 AM

## 2022-10-11 NOTE — Progress Notes (Signed)
Pharmacy Antibiotic Note  Timothy [name] is a 57 y.o. male admitted on 10/04/2022 with abdominal wall abscess.  Noted culture from OR on 3/15 now with MRSA growing.  Pharmacy has been consulted for vancomycin dosing.   Patient was initially on Zosyn for a short course given open colon during surgery.  Patient's WBC continues to worsen; therefore, Zosyn was resumed.  Renal function stable, afebrile, WBC up to 13.9.  Plan: Vanc 1gm IV Q12H for AUC AUC 415 with SCr 0.8 Zosyn EID 3.375gm IV Q8H Monitor renal fxn, clinical progress to de-escalate Check vanc levels in AM   Height: 5\' 9"  (175.3 cm) Weight: 72.6 kg (160 lb) IBW/kg (Calculated) : 70.7  Temp (24hrs), Avg:98.5 F (36.9 C), Min:98 F (36.7 C), Max:98.9 F (37.2 C)  Recent Labs  Lab 10/05/22 0237 10/07/22 0927 10/08/22 0040 10/09/22 0119 10/10/22 0323 10/11/22 0353  WBC 14.0* 11.4* 10.3 10.6* 13.9*  --   CREATININE 0.86 0.62 0.65 0.43* 0.53* 0.44*     Estimated Creatinine Clearance: 105.6 mL/min (A) (by C-G formula based on SCr of 0.44 mg/dL (L)).    Allergies  Allergen Reactions   Ambien [Zolpidem Tartrate] Other (See Comments)    Caused agitation of mood.    Bactroban [Mupirocin Calcium] Swelling    Caused infection at a surgical site   Darvocet [Propoxyphene N-Acetaminophen] Hives and Itching   Zestril [Lisinopril] Cough   Morphine And Related Itching    Zosyn 3/16 >> 3/20, restarted 3/21 with leukocytosis >> Vanc 3/19 >>  3/15 abd abscess - MRSA  Fitzpatrick Alberico D. Mina Marble, PharmD, BCPS, [place] 10/11/2022, 1:23 PM

## 2022-10-12 LAB — BASIC METABOLIC PANEL
Anion gap: 6 (ref 5–15)
BUN: <5 mg/dL — ABNORMAL LOW (ref 6–20)
CO2: 28 mmol/L (ref 22–32)
Calcium: 8.1 mg/dL — ABNORMAL LOW (ref 8.9–10.3)
Chloride: 104 mmol/L (ref 98–111)
Creatinine, Ser: 0.55 mg/dL — ABNORMAL LOW (ref 0.61–1.24)
GFR, Estimated: >60 mL/min (ref >60)
Glucose, Bld: 124 mg/dL — ABNORMAL HIGH (ref 70–99)
Potassium: 4.1 mmol/L (ref 3.5–5.1)
Sodium: 138 mmol/L (ref 135–145)

## 2022-10-12 LAB — VANCOMYCIN, TROUGH: Vancomycin Tr: 6 ug/mL — ABNORMAL LOW (ref 15–20)

## 2022-10-12 LAB — MAGNESIUM: Magnesium: 1.7 mg/dL (ref 1.7–2.4)

## 2022-10-12 LAB — VANCOMYCIN, PEAK: Vancomycin Pk: 42 ug/mL — ABNORMAL HIGH (ref 30–40)

## 2022-10-12 NOTE — Plan of Care (Signed)

## 2022-10-12 NOTE — Progress Notes (Signed)
Son did not visit during this shift.  Unable to teach him about the patient's dressing change.  Educated patient about how to change the abdominal dressing.  Educated how to use aseptic technique. The patient verbalized understanding.

## 2022-10-12 NOTE — Progress Notes (Signed)
Assessment & Plan: POD#8 - s/p EX LAP, DRAINAGE ABDOMIAL WALL ABSCESS, REMOVAL OF MESH, SEGMENTAL COLON RESECTION, LYSIS OF ADHESION - Dr. Marlou Starks 10/04/2022 - tolerating full liquids, passing flatus - advance to soft diet - IV zosyn and vancomycin - open wound dressing changes - instruct family in wound care - CXR negative for pneumonia - Ambulate in halls  Patient ambulating in halls, passing flatus.  Anxious to go home. Will have son instructed in wound care.  Anticipate discharge on Monday per Dr. Marlou Starks.        Timothy Gemma, MD [facility] [place] practice Office: [phone]        Chief Complaint: Infected mesh, colonic obstruction  Subjective: Patient in good spirits, passing flatus.  Tolerating diet.  Objective: Vital signs in last 24 hours: Temp:  [98.2 F (36.8 C)-98.8 F (37.1 C)] 98.3 F (36.8 C) (03/23 0814) Pulse Rate:  [108-120] 108 (03/23 0718) Resp:  [16-21] 19 (03/23 0814) BP: (128-156)/(74-97) 156/92 (03/23 0814) SpO2:  [97 %-100 %] 98 % (03/23 0814) FiO2 (%):  [27 %-44 %] 27 % (03/23 0000) Last BM Date : 10/10/22  Intake/Output from previous day: 03/22 0701 - 03/23 0700 In: 48 [P.O.:660] Out: 900 [Urine:900] Intake/Output this shift: Total I/O In: -  Out: 1000 [Urine:1000]  Physical Exam: HEENT - sclerae clear, mucous membranes moist Neck - soft Abdomen - mild distension; actively passing flatus; non-tender; dressing intact Ext - no edema, non-tender Neuro - alert & oriented, no focal deficits  Lab Results:  Recent Labs    10/10/22 0323  WBC 13.9*  HGB 11.2*  HCT 34.9*  PLT 360   BMET Recent Labs    10/11/22 0353 10/12/22 0315  NA 137 138  K 3.4* 4.1  CL 102 104  CO2 25 28  GLUCOSE 99 124*  BUN <5* <5*  CREATININE 0.44* 0.55*  CALCIUM 8.3* 8.1*   PT/INR No results for input(s): "LABPROT", "INR" in the last 72 hours. Comprehensive Metabolic Panel:    Component Value Date/Time   NA 138 10/12/2022 0315    NA 137 10/11/2022 0353   NA 137 07/25/2021 1253   NA 138 03/24/2020 1609   K 4.1 10/12/2022 0315   K 3.4 (L) 10/11/2022 0353   CL 104 10/12/2022 0315   CL 102 10/11/2022 0353   CO2 28 10/12/2022 0315   CO2 25 10/11/2022 0353   BUN <5 (L) 10/12/2022 0315   BUN <5 (L) 10/11/2022 0353   BUN 25 (H) 07/25/2021 1253   BUN 22 03/24/2020 1609   CREATININE 0.55 (L) 10/12/2022 0315   CREATININE 0.44 (L) 10/11/2022 0353   CREATININE 0.98 05/16/2016 1129   CREATININE 0.70 10/30/2015 1001   GLUCOSE 124 (H) 10/12/2022 0315   GLUCOSE 99 10/11/2022 0353   CALCIUM 8.1 (L) 10/12/2022 0315   CALCIUM 8.3 (L) 10/11/2022 0353   AST 28 10/01/2022 0923   AST 21 07/24/2021 0159   ALT 17 10/01/2022 0923   ALT 21 07/24/2021 0159   ALKPHOS 85 10/01/2022 0923   ALKPHOS 76 07/24/2021 0159   BILITOT 0.6 10/01/2022 0923   BILITOT 0.6 07/24/2021 0159   BILITOT <0.2 11/11/2016 1135   PROT 7.7 10/01/2022 0923   PROT 8.0 07/24/2021 0159   PROT 6.8 11/11/2016 1135   ALBUMIN 4.1 10/01/2022 0923   ALBUMIN 4.2 07/24/2021 0159   ALBUMIN 4.2 11/11/2016 1135    Studies/Results: DG Chest Port 1 View  Result Date: 10/11/2022 CLINICAL DATA:  Productive cough  EXAM: PORTABLE CHEST 1 VIEW COMPARISON:  Previous chest radiographs done on 07/24/2021, CT chest done on 07/11/2022 FINDINGS: Cardiac size is within normal limits. There are no signs of pulmonary edema. Linear densities are seen in medial left lower lung field. There is no pleural effusion or pneumothorax. There is a density under the right hemidiaphragm. This finding was not evident in the previous chest radiographs. IMPRESSION: Linear densities in medial left lower lung fields suggest atelectasis/pneumonia. Right hemidiaphragm is elevated along with air attenuation under the right hemidiaphragm, most likely due to interposition of gas-filled bowel loops between liver and right hemidiaphragm. If there is clinical suspicion for abdominal pathology such as  pneumoperitoneum, follow-up supine and upright abdominal radiographs along with CT if warranted may be considered. Electronically Signed   By: Elmer Picker M.D.   On: 10/11/2022 12:13      Timothy [name] 10/12/2022  Patient ID: Timothy [name], male   DOB: October 17, 1965, 57 y.o.   MRN: [id number]

## 2022-10-12 NOTE — Progress Notes (Signed)
Pharmacy Antibiotic Note  Timothy [name] is a 57 y.o. male admitted on 10/04/2022 with  abdominal wall abscess .  Pharmacy has been consulted for Vancomycin and Zosyn dosing.  Vancomycin levels ordered to be drawn at steady state. Vancomycin dose given 3/23 at 02:16. VP drawn 3/23 at 03:15 and resulted in 42, likely due to early drawn time. VT drawn 3/23 at 12:19 and resulted in 6, which is subtherapeutic given our trough goal is 15-20. Given when the levels were drawn, calculated a dose of Vancomycin 1250 mg q12 hrs. Will not change the vancomycin dose at this time, given the uncertainty of the values resulting from early draw times.  Plan: Continue Vancomycin 1000 mg q12 hrs (Predicted AUC 415.8 with Scr 0.8) Continue Zosyn 3.375 g q8 hrs Continue to monitor renal function, and s/sx of clinical improvement   Height: 5\' 9"  (175.3 cm) Weight: 72.6 kg (160 lb) IBW/kg (Calculated) : 70.7  Temp (24hrs), Avg:98.4 F (36.9 C), Min:98.2 F (36.8 C), Max:98.8 F (37.1 C)  Recent Labs  Lab 10/07/22 0927 10/08/22 0040 10/09/22 0119 10/10/22 0323 10/11/22 0353 10/12/22 0315 10/12/22 1219  WBC 11.4* 10.3 10.6* 13.9*  --   --   --   CREATININE 0.62 0.65 0.43* 0.53* 0.44* 0.55*  --   VANCOTROUGH  --   --   --   --   --   --  6*  VANCOPEAK  --   --   --   --   --  42*  --     Estimated Creatinine Clearance: 105.6 mL/min (A) (by C-G formula based on SCr of 0.55 mg/dL (L)).    Allergies  Allergen Reactions   Ambien [Zolpidem Tartrate] Other (See Comments)    Caused agitation of mood.    Bactroban [Mupirocin Calcium] Swelling    Caused infection at a surgical site   Darvocet [Propoxyphene N-Acetaminophen] Hives and Itching   Zestril [Lisinopril] Cough   Morphine And Related Itching    Antimicrobials this admission: Cefazolin 3/15 >> 3/15 Zosyn 3/15 >> 3/26 Vancomycin 3/19 >>   Dose adjustments this admission: None  Microbiology results: 3/15 Surgical/Deep wound Cx: Rare MRSA,  Vancomycin sensitive  Thank you for allowing pharmacy to be a part of this patient's care.  Candelaria Stagers. [place] Intern 10/12/2022 2:50 PM

## 2022-10-13 ENCOUNTER — Inpatient Hospital Stay (HOSPITAL_COMMUNITY): Payer: Medicare Other | Admitting: Anesthesiology

## 2022-10-13 ENCOUNTER — Inpatient Hospital Stay (HOSPITAL_COMMUNITY): Payer: Medicare Other

## 2022-10-13 ENCOUNTER — Encounter (HOSPITAL_COMMUNITY)
Admission: RE | Disposition: A | Discharge status: Home / Self Care | Payer: Self-pay | Source: Ambulatory Visit | Attending: General Surgery

## 2022-10-13 DIAGNOSIS — Z939 Artificial opening status, unspecified: Secondary | ICD-10-CM

## 2022-10-13 DIAGNOSIS — K9189 Other postprocedural complications and disorders of digestive system: Secondary | ICD-10-CM | POA: Diagnosis not present

## 2022-10-13 DIAGNOSIS — J449 Chronic obstructive pulmonary disease, unspecified: Secondary | ICD-10-CM | POA: Diagnosis not present

## 2022-10-13 DIAGNOSIS — Z9889 Other specified postprocedural states: Secondary | ICD-10-CM

## 2022-10-13 DIAGNOSIS — I10 Essential (primary) hypertension: Secondary | ICD-10-CM

## 2022-10-13 DIAGNOSIS — F17210 Nicotine dependence, cigarettes, uncomplicated: Secondary | ICD-10-CM | POA: Diagnosis not present

## 2022-10-13 HISTORY — PX: LAPAROTOMY: SHX154

## 2022-10-13 HISTORY — PX: COLONOSCOPY: SHX5424

## 2022-10-13 LAB — CBC WITH DIFFERENTIAL/PLATELET
Abs Immature Granulocytes: 0.15 10*3/uL — ABNORMAL HIGH (ref 0.00–0.07)
Basophils Absolute: 0 10*3/uL (ref 0.0–0.1)
Basophils Relative: 0 %
Eosinophils Absolute: 0 10*3/uL (ref 0.0–0.5)
Eosinophils Relative: 0 %
HCT: 34.5 % — ABNORMAL LOW (ref 39.0–52.0)
Hemoglobin: 11.1 g/dL — ABNORMAL LOW (ref 13.0–17.0)
Immature Granulocytes: 1 %
Lymphocytes Relative: 5 %
Lymphs Abs: 1.1 10*3/uL (ref 0.7–4.0)
MCH: 31 pg (ref 26.0–34.0)
MCHC: 32.2 g/dL (ref 30.0–36.0)
MCV: 96.4 fL (ref 80.0–100.0)
Monocytes Absolute: 2.1 10*3/uL — ABNORMAL HIGH (ref 0.1–1.0)
Monocytes Relative: 9 %
Neutro Abs: 19.7 10*3/uL — ABNORMAL HIGH (ref 1.7–7.7)
Neutrophils Relative %: 85 %
Platelets: 694 10*3/uL — ABNORMAL HIGH (ref 150–400)
RBC: 3.58 MIL/uL — ABNORMAL LOW (ref 4.22–5.81)
RDW: 14.1 % (ref 11.5–15.5)
WBC: 23.1 10*3/uL — ABNORMAL HIGH (ref 4.0–10.5)
nRBC: 0 % (ref 0.0–0.2)

## 2022-10-13 SURGERY — LAPAROTOMY, EXPLORATORY
Anesthesia: General | Site: Abdomen

## 2022-10-13 MED ORDER — HYDROMORPHONE HCL 1 MG/ML IJ SOLN
0.5000 mg | INTRAMUSCULAR | Status: DC | PRN
  Administered 2022-10-13: 1 mg via INTRAVENOUS
  Filled 2022-10-13: qty 1

## 2022-10-13 MED ORDER — LIDOCAINE 2% (20 MG/ML) 5 ML SYRINGE
INTRAMUSCULAR | Status: AC
  Filled 2022-10-13: qty 5

## 2022-10-13 MED ORDER — ONDANSETRON HCL 4 MG/2ML IJ SOLN
INTRAMUSCULAR | Status: AC
  Filled 2022-10-13: qty 2

## 2022-10-13 MED ORDER — MIDAZOLAM HCL 2 MG/2ML IJ SOLN
INTRAMUSCULAR | Status: AC
  Filled 2022-10-13: qty 2

## 2022-10-13 MED ORDER — SUCCINYLCHOLINE CHLORIDE 200 MG/10ML IV SOSY
PREFILLED_SYRINGE | INTRAVENOUS | Status: AC
  Filled 2022-10-13: qty 10

## 2022-10-13 MED ORDER — SODIUM CHLORIDE 0.9 % IV SOLN
12.5000 mg | Freq: Four times a day (QID) | INTRAVENOUS | Status: DC | PRN
  Administered 2022-10-13: 12.5 mg via INTRAVENOUS
  Filled 2022-10-13: qty 0.5
  Filled 2022-10-13: qty 12.5

## 2022-10-13 MED ORDER — FENTANYL CITRATE (PF) 250 MCG/5ML IJ SOLN
INTRAMUSCULAR | Status: AC
  Filled 2022-10-13: qty 5

## 2022-10-13 MED ORDER — ROCURONIUM BROMIDE 10 MG/ML (PF) SYRINGE
PREFILLED_SYRINGE | INTRAVENOUS | Status: DC | PRN
  Administered 2022-10-13: 50 mg via INTRAVENOUS
  Administered 2022-10-13: 30 mg via INTRAVENOUS
  Administered 2022-10-13: 20 mg via INTRAVENOUS

## 2022-10-13 MED ORDER — DEXAMETHASONE SODIUM PHOSPHATE 10 MG/ML IJ SOLN
INTRAMUSCULAR | Status: AC
  Filled 2022-10-13: qty 1

## 2022-10-13 MED ORDER — SODIUM CHLORIDE 0.9 % IV SOLN: INTRAVENOUS | Status: DC | PRN

## 2022-10-13 MED ORDER — ALBUTEROL SULFATE HFA 108 (90 BASE) MCG/ACT IN AERS
INHALATION_SPRAY | RESPIRATORY_TRACT | Status: AC
  Filled 2022-10-13: qty 6.7

## 2022-10-13 MED ORDER — 0.9 % SODIUM CHLORIDE (POUR BTL) OPTIME
TOPICAL | Status: DC | PRN
  Administered 2022-10-13: 1000 mL
  Administered 2022-10-13: 4000 mL

## 2022-10-13 MED ORDER — LIDOCAINE 2% (20 MG/ML) 5 ML SYRINGE
INTRAMUSCULAR | Status: DC | PRN
  Administered 2022-10-13: 60 mg via INTRAVENOUS

## 2022-10-13 MED ORDER — HYDROMORPHONE HCL 1 MG/ML IJ SOLN
INTRAMUSCULAR | Status: AC
  Filled 2022-10-13: qty 0.5

## 2022-10-13 MED ORDER — PHENYLEPHRINE 80 MCG/ML (10ML) SYRINGE FOR IV PUSH (FOR BLOOD PRESSURE SUPPORT)
PREFILLED_SYRINGE | INTRAVENOUS | Status: AC
  Filled 2022-10-13: qty 10

## 2022-10-13 MED ORDER — HYDROMORPHONE HCL 1 MG/ML IJ SOLN
INTRAMUSCULAR | Status: DC | PRN
  Administered 2022-10-13: .5 mg via INTRAVENOUS

## 2022-10-13 MED ORDER — KETAMINE HCL 10 MG/ML IJ SOLN
INTRAMUSCULAR | Status: DC | PRN
  Administered 2022-10-13: 50 mg via INTRAVENOUS

## 2022-10-13 MED ORDER — IOPAMIDOL (ISOVUE-370) INJECTION 76%
75.0000 mL | Freq: Once | INTRAVENOUS | Status: AC | PRN
  Administered 2022-10-13: 75 mL via INTRAVENOUS

## 2022-10-13 MED ORDER — ROCURONIUM BROMIDE 10 MG/ML (PF) SYRINGE
PREFILLED_SYRINGE | INTRAVENOUS | Status: AC
  Filled 2022-10-13: qty 10

## 2022-10-13 MED ORDER — KETAMINE HCL 50 MG/5ML IJ SOSY
PREFILLED_SYRINGE | INTRAMUSCULAR | Status: AC
  Filled 2022-10-13: qty 5

## 2022-10-13 MED ORDER — LACTATED RINGERS IV SOLN: INTRAVENOUS | Status: DC | PRN

## 2022-10-13 MED ORDER — PHENYLEPHRINE HCL (PRESSORS) 10 MG/ML IV SOLN
INTRAVENOUS | Status: DC | PRN
  Administered 2022-10-13 (×2): 80 ug via INTRAVENOUS

## 2022-10-13 MED ORDER — DEXAMETHASONE SODIUM PHOSPHATE 10 MG/ML IJ SOLN
INTRAMUSCULAR | Status: DC | PRN
  Administered 2022-10-13: 10 mg via INTRAVENOUS

## 2022-10-13 MED ORDER — ONDANSETRON HCL 4 MG/2ML IJ SOLN
INTRAMUSCULAR | Status: DC | PRN
  Administered 2022-10-13: 4 mg via INTRAVENOUS

## 2022-10-13 MED ORDER — LIDOCAINE HCL URETHRAL/MUCOSAL 2 % EX GEL
CUTANEOUS | Status: AC
  Filled 2022-10-13: qty 11

## 2022-10-13 MED ORDER — PROPOFOL 500 MG/50ML IV EMUL
INTRAVENOUS | Status: DC | PRN
  Administered 2022-10-13: 50 ug/kg/min via INTRAVENOUS

## 2022-10-13 MED ORDER — MIDAZOLAM HCL 2 MG/2ML IJ SOLN
INTRAMUSCULAR | Status: DC | PRN
  Administered 2022-10-13 (×2): 1 mg via INTRAVENOUS

## 2022-10-13 MED ORDER — ALBUMIN HUMAN 5 % IV SOLN: INTRAVENOUS | Status: DC | PRN

## 2022-10-13 MED ORDER — FENTANYL CITRATE (PF) 250 MCG/5ML IJ SOLN
INTRAMUSCULAR | Status: DC | PRN
  Administered 2022-10-13: 100 ug via INTRAVENOUS
  Administered 2022-10-13: 50 ug via INTRAVENOUS
  Administered 2022-10-13: 100 ug via INTRAVENOUS
  Administered 2022-10-13 (×5): 50 ug via INTRAVENOUS

## 2022-10-13 MED ORDER — SUCCINYLCHOLINE CHLORIDE 200 MG/10ML IV SOSY
PREFILLED_SYRINGE | INTRAVENOUS | Status: DC | PRN
  Administered 2022-10-13: 100 mg via INTRAVENOUS

## 2022-10-13 MED ORDER — METOPROLOL TARTRATE 5 MG/5ML IV SOLN
5.0000 mg | Freq: Four times a day (QID) | INTRAVENOUS | Status: DC | PRN
  Administered 2022-10-17 – 2022-10-18 (×2): 5 mg via INTRAVENOUS
  Filled 2022-10-13 (×2): qty 5

## 2022-10-13 MED ORDER — ETOMIDATE 2 MG/ML IV SOLN
INTRAVENOUS | Status: DC | PRN
  Administered 2022-10-13: 10 mg via INTRAVENOUS

## 2022-10-13 MED ORDER — METHOCARBAMOL 500 MG PO TABS
500.0000 mg | ORAL_TABLET | Freq: Four times a day (QID) | ORAL | Status: DC
  Administered 2022-10-13 – 2022-10-14 (×2): 500 mg via ORAL
  Filled 2022-10-13 (×2): qty 1

## 2022-10-13 SURGICAL SUPPLY — 51 items
APL PRP STRL LF DISP 70% ISPRP (MISCELLANEOUS) ×2
BAG COUNTER SPONGE SURGICOUNT (BAG) ×2 IMPLANT
BAG SPNG CNTER NS LX DISP (BAG) ×2
BIOPATCH RED 1 DISK 7.0 (GAUZE/BANDAGES/DRESSINGS) IMPLANT
BIOPATCH WHT 1IN DISK W/4.0 H (GAUZE/BANDAGES/DRESSINGS) IMPLANT
BNDG GAUZE DERMACEA FLUFF 4 (GAUZE/BANDAGES/DRESSINGS) IMPLANT
BNDG GZE DERMACEA 4 6PLY (GAUZE/BANDAGES/DRESSINGS) ×4
CATH COUDE FOLEY 2W 5CC 16FR (CATHETERS) IMPLANT
CHLORAPREP W/TINT 26 (MISCELLANEOUS) ×2 IMPLANT
COVER SURGICAL LIGHT HANDLE (MISCELLANEOUS) ×2 IMPLANT
DRAPE LAPAROSCOPIC ABDOMINAL (DRAPES) ×2 IMPLANT
DRAPE WARM FLUID 44X44 (DRAPES) ×2 IMPLANT
DRSG OPSITE POSTOP 4X10 (GAUZE/BANDAGES/DRESSINGS) IMPLANT
DRSG OPSITE POSTOP 4X8 (GAUZE/BANDAGES/DRESSINGS) IMPLANT
DRSG TEGADERM 2-3/8X2-3/4 SM (GAUZE/BANDAGES/DRESSINGS) IMPLANT
DRSG TEGADERM 4X10 (GAUZE/BANDAGES/DRESSINGS) IMPLANT
ELECT BLADE 6.5 EXT (BLADE) IMPLANT
ELECT CAUTERY BLADE 6.4 (BLADE) ×2 IMPLANT
ELECT REM PT RETURN 9FT ADLT (ELECTROSURGICAL) ×2
ELECTRODE REM PT RTRN 9FT ADLT (ELECTROSURGICAL) ×2 IMPLANT
EVACUATOR SILICONE 100CC (DRAIN) IMPLANT
GAUZE SPONGE 4X4 12PLY STRL (GAUZE/BANDAGES/DRESSINGS) IMPLANT
GLOVE BIOGEL PI IND STRL 7.0 (GLOVE) ×2 IMPLANT
GLOVE SURG SS PI 7.0 STRL IVOR (GLOVE) ×2 IMPLANT
GOWN STRL REUS W/ TWL LRG LVL3 (GOWN DISPOSABLE) ×4 IMPLANT
GOWN STRL REUS W/TWL LRG LVL3 (GOWN DISPOSABLE) ×4
HANDLE SUCTION POOLE (INSTRUMENTS) ×2 IMPLANT
KIT BASIN OR (CUSTOM PROCEDURE TRAY) ×2 IMPLANT
KIT OSTOMY DRAINABLE 2.75 STR (WOUND CARE) IMPLANT
LIGASURE IMPACT 36 18CM CVD LR (INSTRUMENTS) IMPLANT
PACK GENERAL/GYN (CUSTOM PROCEDURE TRAY) ×2 IMPLANT
PENCIL BUTTON HOLSTER BLD 10FT (ELECTRODE) IMPLANT
PENCIL SMOKE EVACUATOR (MISCELLANEOUS) ×2 IMPLANT
SPONGE T-LAP 18X18 ~~LOC~~+RFID (SPONGE) IMPLANT
STAPLER PROXIMATE 75MM BLUE (STAPLE) IMPLANT
STAPLER VISISTAT 35W (STAPLE) ×2 IMPLANT
SUCTION POOLE HANDLE (INSTRUMENTS) ×2
SUT ETHILON 2 0 FS 18 (SUTURE) IMPLANT
SUT NOV 1 T60/GS (SUTURE) IMPLANT
SUT PDS AB 1 TP1 96 (SUTURE) IMPLANT
SUT PDS AB 3-0 SH 27 (SUTURE) ×4 IMPLANT
SUT SILK 2 0 (SUTURE) ×2
SUT SILK 2 0 SH CR/8 (SUTURE) ×2 IMPLANT
SUT SILK 2 0 TIES 10X30 (SUTURE) ×2 IMPLANT
SUT SILK 2-0 18XBRD TIE 12 (SUTURE) IMPLANT
SUT SILK 3 0 SH CR/8 (SUTURE) ×2 IMPLANT
SUT SILK 3 0 TIES 10X30 (SUTURE) ×2 IMPLANT
SUT VIC AB 3-0 SH 18 (SUTURE) IMPLANT
TOWEL GREEN STERILE (TOWEL DISPOSABLE) ×2 IMPLANT
TRAY FOLEY MTR SLVR 16FR STAT (SET/KITS/TRAYS/PACK) ×2 IMPLANT
YANKAUER SUCT BULB TIP NO VENT (SUCTIONS) IMPLANT

## 2022-10-13 NOTE — Progress Notes (Signed)
On call MD, Greer Pickerel, called back, RN stated concerns about patient's Heart rate and vital signs and mentation. Patient got intermittent confusion this afternoon,RN stated about EKG and last echo done was 2023.

## 2022-10-13 NOTE — Progress Notes (Signed)
Patient ID: Timothy [name], male   DOB: [birth year], 57 y.o.   MRN: [id number]   The patient continues to remain tachycardic His WBC is elevated to 23.1 K  I reviewed his CT scan of the abdomen and pelvis.  The official read is not back but he appears to have a large amount of free air with a large intra-abdominal abscess and free fluid.  There is difficult to tell if this is coming from small bowel or from his anastomosis.  He appears ill on exam and has tenderness with guarding especially in the left abdomen  Given his exam, elevated white blood count, and CT findings, I believe he needs an emergent exploratory laparotomy.  I explained the reasoning for this with him in detail.  I discussed the risks which includes but is not limited to bleeding, infection, injury to surrounding structures, the need for bowel resection, the need for an ostomy, the possibility of staying intubated with an open abdomen, cardiopulmonary issues, and the worsening of his medical condition.  He understands and agrees to proceed to the operating room.  He is already on IV antibiotics.  The OR has been notified of the procedure.   Coralie Keens, MD

## 2022-10-13 NOTE — Progress Notes (Signed)
Patient refused dressing change

## 2022-10-13 NOTE — Progress Notes (Signed)
Alerted by nurse pt is tachy and has some confusion Change lopressor to cover tachy Check cbc Bmet ok this am Rounding MD this am noted distension with n/v Pt could be developing ileus or intra-abd complication Will make pt formally NPO x meds No fever. But pt has been on abx (zosyn/vanc) Will check CT with contrast - nurse doesn't believe pt can tolerate oral contrast so will just do IV contrast Dc pca for now Check 12 lead Consider tx to Progressive if develops addl changes  Leighton Ruff. Redmond Pulling, MD, FACS General, Bariatric, & Minimally Invasive Surgery [facility],  [place]

## 2022-10-13 NOTE — Progress Notes (Signed)
Patient, Sinus Tach on the monitor. Alert and confused this afternoon, trying to get out of the bed, causing for the PIV to get pulled. MD on call made aware, awaiting for for a callback.

## 2022-10-13 NOTE — Anesthesia Preprocedure Evaluation (Addendum)
Anesthesia Evaluation  Patient identified by MRN, date of birth, ID band Patient awake    Reviewed: Allergy & Precautions, NPO status , Patient's Chart, lab work & pertinent test results, Unable to perform ROS - Chart review onlyPreop documentation limited or incomplete due to emergent nature of procedure.  History of Anesthesia Complications (+) Family history of anesthesia reaction and history of anesthetic complications  Airway Mallampati: II  TM Distance: >3 FB     Dental  (+) Poor Dentition, Dental Advisory Given   Pulmonary shortness of breath and with exertion, COPD,  COPD inhaler, Current Smoker   + rhonchi  + decreased breath sounds      Cardiovascular hypertension, Pt. on medications  Rhythm:Regular Rate:Normal  Echo 03/08/22 1. Left ventricular ejection fraction, by estimation, is 45 to 50%. The left ventricle has mildly decreased function. The left ventricle demonstrates global hypokinesis. Left ventricular diastolic parameters are indeterminate. The average left ventricular global longitudinal strain is -11.6 %. The global longitudinal strain is abnormal.   2. Right ventricular systolic function is normal. The right ventricular size is normal.   3. The mitral valve is grossly normal. No evidence of mitral valve regurgitation. No evidence of mitral stenosis.   4. The aortic valve was not well visualized. Aortic valve regurgitation is not visualized. No aortic stenosis is present.   5. The inferior vena cava is normal in size with greater than 50% respiratory variability, suggesting right atrial pressure of 3 mmHg.   EKG 03/01/22 Sinus tachycardia, abnormal QRST   Neuro/Psych  Headaches PSYCHIATRIC DISORDERS Anxiety Depression Bipolar Disorder   Hx/o glaucoma non compliant with Rx    GI/Hepatic PUD,GERD  Medicated,,(+)     substance abuse  alcohol use and cocaine useHx/o substance abuse S/P colostomy and reversal Hx/o  diverticulosis and diverticulitis   Endo/Other  negative endocrine ROS    Renal/GU negative Renal ROS  negative genitourinary   Musculoskeletal Abdominal wall abscess S/P remote ventral hernia repair   Abdominal  (+)  Abdomen: tender.   Peds  Hematology negative hematology ROS (+)   Anesthesia Other Findings   Reproductive/Obstetrics HPV Condyloma                             Anesthesia Physical Anesthesia Plan  ASA: 3 and emergent  Anesthesia Plan: General   Post-op Pain Management: Dilaudid IV and Ketamine IV*   Induction: Intravenous, Rapid sequence and Cricoid pressure planned  PONV Risk Score and Plan: 4 or greater and Treatment may vary due to age or medical condition, Midazolam, Ondansetron and Dexamethasone  Airway Management Planned: Oral ETT  Additional Equipment: Arterial line  Intra-op Plan:   Post-operative Plan: Possible Post-op intubation/ventilation  Informed Consent: I have reviewed the patients History and Physical, chart, labs and discussed the procedure including the risks, benefits and alternatives for the proposed anesthesia with the patient or authorized representative who has indicated his/her understanding and acceptance.     Dental advisory given  Plan Discussed with: CRNA  Anesthesia Plan Comments: (Current smoker, alcohol abuse, mildly reduced EF 45-50%. See APP note by Durel Salts, FNP )       Anesthesia Quick Evaluation

## 2022-10-13 NOTE — Op Note (Signed)
Traevon Lathem 10/04/2022 - 10/13/2022   Pre-op Diagnosis: INTRA ABDOMINAL INFECTION     Post-op Diagnosis: ANASTOMOTIC LEAK WITH INTRA-ABDOMINAL ABSCESS  Procedure(s): EXPLORATORY LAPAROTOMY RESECTION OF COLON ANASTOMOSIS   END COLOSCOPY  Surgeon(s): Kinsinger, Arta Bruce, MD Coralie Keens, MD  Anesthesia: General  Staff:  Circulator: Rollene Rotunda, RN Scrub Person: Lois Huxley Circulator Assistant: Kara Mead, RN  Estimated Blood Loss: less than 100 mL               Specimens: anastomosis sent to path  Indications: This is a 57 year old gentleman who is 9 days status post a laparotomy with drainage of abdominal abscess with removal of mesh and segmental colon resection.  He developed tachycardia and an elevated white blood count today.  A CT scan of the abdomen pelvis showed a large amount of free air as well as what appeared to be an intra-abdominal abscess.  This was suspicious for an anastomotic leak.  The decision was made to proceed emergently to the operating room for exploratory laparotomy  Findings: The patient was found to have disruption of the anastomosis as well as disruption of the colotomy proximal to the colon anastomosis.  There was also abscess and phlegmon in the left abdomen.  Procedure: The patient was brought to the operating identified as the correct patient.  He was placed upon the operating table and general anesthesia was induced.  Several attempts to place a Foley were unsuccessful.  His abdomen and pelvis were then prepped and draped in usual sterile fashion.  We removed the previous midline sutures the entire length of the incision and gain entrance to the abdominal cavity.  We immediately encountered free air and intra-abdominal abscess.  We were able to free up adhesions bluntly and identify the colon anastomosis.  There was disruption of the anastomosis with visible stool present as well as disruption of the colotomy proximal to the  anastomosis.  There was significant fibrinous exudate involving the small bowel in this area and an abscess going toward the retroperitoneum.  There was also small bowel fixated to the colon distal to the anastomosis.  We were able to free of the anastomosis and transected it distally with a GIA 75 stapler.  We then took down the mesentery with the LigaSure to the level of the disruption at the colotomy site.  We then transected the proximal colon at the colotomy site which was already disrupted.  The anastomosis was then sent to pathology for evaluation.  We mobilized the descending colon further with the LigaSure.  We then repaired to separate serosal tears in the small bowel that were fixated to the pelvis with interrupted silk sutures.  We decided not to free up any further bowel from the pelvis for fear of creating enterotomies or fistula.  We next made a circular incision with the cautery in the left upper quadrant of the abdomen.  We took this down to the fascia which was then opened with the cautery.  The underlying muscle fibers were then dissected bluntly and the underlying fascia peritoneum were then opened with the cautery.  We then pulled out the descending colon at this area as the colostomy.  We then copiously irrigated the abdomen with saline.  We removed some of the fibrinous exudate from the small bowel phlegmon bluntly.  We made a separate skin incision in the patient's left lower quadrant and placed a 19 [place] Blake drain that went down to the pelvis and then up into  the left retroperitoneum at the abscess site.  This was sutured in place with a 2-0 nylon suture.  At this point hemostasis appeared to be achieved.  We were then able to close the patient's midline fascia with a running #1 looped PDS suture and #1 Novafil internal retention sutures.  We matured the ostomy site with interrupted 3-0 Vicryl sutures.  The drain was placed to bulb suction.  An ostomy appliance was applied and wet-to-dry  saline soaked Kerlix was placed to midline wound.  We were then able to place a coud catheter through the penis and into the bladder with good urine return.  This was placed a Foley bag.  The patient tolerated the procedure.  All the counts were correct at the end of the procedure.  The patient was then left intubated and taken from the operating room to the intensive care unit.          Coralie Keens   Date: 10/13/2022  Time: 11:55 PM

## 2022-10-13 NOTE — Progress Notes (Signed)
Assessment & Plan: POD#9 - s/p EX LAP, DRAINAGE ABDOMIAL WALL ABSCESS, REMOVAL OF MESH, SEGMENTAL COLON RESECTION, LYSIS OF ADHESION - Dr. Marlou Starks 10/04/2022 - onset nausea, emesis this AM - plan liquids today - IV zosyn and vancomycin - open wound dressing changes - instruct family in wound care - CXR negative for pneumonia - Ambulate in halls - check CBC in AM 3/25   Patient with onset of nausea and emesis this AM despite two BM's.  Encouraged him to only take liquids today and to ambulate with family.  Will instruct family in wound care.        Armandina Gemma, MD [facility] [place] practice Office: [phone]        Chief Complaint: Abdominal wall abscess, colonic obstruction  Subjective: Patient in bed, family at bedside.  Complains of nausea, emesis this AM.  BM x 2.  Objective: Vital signs in last 24 hours: Temp:  [97.5 F (36.4 C)-98.6 F (37 C)] 98.6 F (37 C) (03/24 0827) Pulse Rate:  [103-137] 137 (03/24 0827) Resp:  [16-20] 20 (03/24 0830) BP: (145-159)/(82-113) 158/113 (03/24 0827) SpO2:  [92 %-100 %] 93 % (03/24 0830) FiO2 (%):  [28 %-32 %] 32 % (03/24 0500) Last BM Date : 10/10/22  Intake/Output from previous day: 03/23 0701 - 03/24 0700 In: 480 [P.O.:480] Out: 3275 [Urine:3275] Intake/Output this shift: No intake/output data recorded.  Physical Exam: HEENT - sclerae clear, mucous membranes moist Neck - soft Abdomen - moderate distension, few BS present; dressing dry and intact Ext - no edema, non-tender Neuro - alert & oriented, no focal deficits  Lab Results:  No results for input(s): "WBC", "HGB", "HCT", "PLT" in the last 72 hours. BMET Recent Labs    10/11/22 0353 10/12/22 0315  NA 137 138  K 3.4* 4.1  CL 102 104  CO2 25 28  GLUCOSE 99 124*  BUN <5* <5*  CREATININE 0.44* 0.55*  CALCIUM 8.3* 8.1*   PT/INR No results for input(s): "LABPROT", "INR" in the last 72 hours. Comprehensive Metabolic Panel:    Component  Value Date/Time   NA 138 10/12/2022 0315   NA 137 10/11/2022 0353   NA 137 07/25/2021 1253   NA 138 03/24/2020 1609   K 4.1 10/12/2022 0315   K 3.4 (L) 10/11/2022 0353   CL 104 10/12/2022 0315   CL 102 10/11/2022 0353   CO2 28 10/12/2022 0315   CO2 25 10/11/2022 0353   BUN <5 (L) 10/12/2022 0315   BUN <5 (L) 10/11/2022 0353   BUN 25 (H) 07/25/2021 1253   BUN 22 03/24/2020 1609   CREATININE 0.55 (L) 10/12/2022 0315   CREATININE 0.44 (L) 10/11/2022 0353   CREATININE 0.98 05/16/2016 1129   CREATININE 0.70 10/30/2015 1001   GLUCOSE 124 (H) 10/12/2022 0315   GLUCOSE 99 10/11/2022 0353   CALCIUM 8.1 (L) 10/12/2022 0315   CALCIUM 8.3 (L) 10/11/2022 0353   AST 28 10/01/2022 0923   AST 21 07/24/2021 0159   ALT 17 10/01/2022 0923   ALT 21 07/24/2021 0159   ALKPHOS 85 10/01/2022 0923   ALKPHOS 76 07/24/2021 0159   BILITOT 0.6 10/01/2022 0923   BILITOT 0.6 07/24/2021 0159   BILITOT <0.2 11/11/2016 1135   PROT 7.7 10/01/2022 0923   PROT 8.0 07/24/2021 0159   PROT 6.8 11/11/2016 1135   ALBUMIN 4.1 10/01/2022 0923   ALBUMIN 4.2 07/24/2021 0159   ALBUMIN 4.2 11/11/2016 1135    Studies/Results: No results found.  Armandina Gemma 10/13/2022  Patient ID: Timothy [name], male   DOB: 1965-09-14, 57 y.o.   MRN: [id number]

## 2022-10-14 ENCOUNTER — Encounter (HOSPITAL_COMMUNITY): Payer: Self-pay | Admitting: General Surgery

## 2022-10-14 ENCOUNTER — Other Ambulatory Visit: Payer: Self-pay

## 2022-10-14 ENCOUNTER — Inpatient Hospital Stay (HOSPITAL_COMMUNITY): Payer: Medicare Other

## 2022-10-14 DIAGNOSIS — L02211 Cutaneous abscess of abdominal wall: Secondary | ICD-10-CM

## 2022-10-14 LAB — POCT I-STAT 7, (LYTES, BLD GAS, ICA,H+H)
Acid-Base Excess: 2 mmol/L (ref 0.0–2.0)
Acid-base deficit: 4 mmol/L — ABNORMAL HIGH (ref 0.0–2.0)
Bicarbonate: 27.8 mmol/L (ref 20.0–28.0)
Bicarbonate: 22.1 mmol/L (ref 20.0–28.0)
Calcium, Ion: 1.15 mmol/L (ref 1.15–1.40)
Calcium, Ion: 1.08 mmol/L — ABNORMAL LOW (ref 1.15–1.40)
HCT: 30 % — ABNORMAL LOW (ref 39.0–52.0)
HCT: 26 % — ABNORMAL LOW (ref 39.0–52.0)
Hemoglobin: 10.2 g/dL — ABNORMAL LOW (ref 13.0–17.0)
Hemoglobin: 8.8 g/dL — ABNORMAL LOW (ref 13.0–17.0)
O2 Saturation: 93 %
O2 Saturation: 100 %
Patient temperature: 98.5
Potassium: 3.8 mmol/L (ref 3.5–5.1)
Potassium: 3.3 mmol/L — ABNORMAL LOW (ref 3.5–5.1)
Sodium: 133 mmol/L — ABNORMAL LOW (ref 135–145)
Sodium: 135 mmol/L (ref 135–145)
TCO2: 29 mmol/L (ref 22–32)
TCO2: 23 mmol/L (ref 22–32)
pCO2 arterial: 48.7 mmHg — ABNORMAL HIGH (ref 32–48)
pCO2 arterial: 44.1 mmHg (ref 32–48)
pH, Arterial: 7.364 (ref 7.35–7.45)
pH, Arterial: 7.308 — ABNORMAL LOW (ref 7.35–7.45)
pO2, Arterial: 71 mmHg — ABNORMAL LOW (ref 83–108)
pO2, Arterial: 343 mmHg — ABNORMAL HIGH (ref 83–108)

## 2022-10-14 LAB — CBC WITH DIFFERENTIAL/PLATELET
Abs Immature Granulocytes: 0.09 10*3/uL — ABNORMAL HIGH (ref 0.00–0.07)
Basophils Absolute: 0 10*3/uL (ref 0.0–0.1)
Basophils Relative: 0 %
Eosinophils Absolute: 0 10*3/uL (ref 0.0–0.5)
Eosinophils Relative: 0 %
HCT: 27.8 % — ABNORMAL LOW (ref 39.0–52.0)
Hemoglobin: 8.8 g/dL — ABNORMAL LOW (ref 13.0–17.0)
Immature Granulocytes: 1 %
Lymphocytes Relative: 4 %
Lymphs Abs: 0.7 10*3/uL (ref 0.7–4.0)
MCH: 31.1 pg (ref 26.0–34.0)
MCHC: 31.7 g/dL (ref 30.0–36.0)
MCV: 98.2 fL (ref 80.0–100.0)
Monocytes Absolute: 1.6 10*3/uL — ABNORMAL HIGH (ref 0.1–1.0)
Monocytes Relative: 9 %
Neutro Abs: 15.3 10*3/uL — ABNORMAL HIGH (ref 1.7–7.7)
Neutrophils Relative %: 86 %
Platelets: 571 10*3/uL — ABNORMAL HIGH (ref 150–400)
RBC: 2.83 MIL/uL — ABNORMAL LOW (ref 4.22–5.81)
RDW: 14.5 % (ref 11.5–15.5)
WBC: 17.7 10*3/uL — ABNORMAL HIGH (ref 4.0–10.5)
nRBC: 0 % (ref 0.0–0.2)

## 2022-10-14 LAB — COMPREHENSIVE METABOLIC PANEL
ALT: 14 U/L (ref 0–44)
AST: 15 U/L (ref 15–41)
Albumin: 1.6 g/dL — ABNORMAL LOW (ref 3.5–5.0)
Alkaline Phosphatase: 65 U/L (ref 38–126)
Anion gap: 8 (ref 5–15)
BUN: 9 mg/dL (ref 6–20)
CO2: 25 mmol/L (ref 22–32)
Calcium: 8 mg/dL — ABNORMAL LOW (ref 8.9–10.3)
Chloride: 101 mmol/L (ref 98–111)
Creatinine, Ser: 1.42 mg/dL — ABNORMAL HIGH (ref 0.61–1.24)
GFR, Estimated: 59 mL/min — ABNORMAL LOW (ref >60)
Glucose, Bld: 137 mg/dL — ABNORMAL HIGH (ref 70–99)
Potassium: 5 mmol/L (ref 3.5–5.1)
Sodium: 134 mmol/L — ABNORMAL LOW (ref 135–145)
Total Bilirubin: 1 mg/dL (ref 0.3–1.2)
Total Protein: 4.5 g/dL — ABNORMAL LOW (ref 6.5–8.1)

## 2022-10-14 LAB — GLUCOSE, CAPILLARY
Glucose-Capillary: 108 mg/dL — ABNORMAL HIGH (ref 70–99)
Glucose-Capillary: 113 mg/dL — ABNORMAL HIGH (ref 70–99)
Glucose-Capillary: 116 mg/dL — ABNORMAL HIGH (ref 70–99)
Glucose-Capillary: 120 mg/dL — ABNORMAL HIGH (ref 70–99)
Glucose-Capillary: 139 mg/dL — ABNORMAL HIGH (ref 70–99)

## 2022-10-14 LAB — PROTIME-INR
INR: 1.3 — ABNORMAL HIGH (ref 0.8–1.2)
Prothrombin Time: 15.8 seconds — ABNORMAL HIGH (ref 11.4–15.2)

## 2022-10-14 LAB — VANCOMYCIN, TROUGH: Vancomycin Tr: 14 ug/mL — ABNORMAL LOW (ref 15–20)

## 2022-10-14 LAB — MRSA NEXT GEN BY PCR, NASAL: MRSA by PCR Next Gen: NOT DETECTED

## 2022-10-14 MED ORDER — NOREPINEPHRINE 4 MG/250ML-% IV SOLN
0.0000 ug/min | INTRAVENOUS | Status: DC
  Administered 2022-10-14: 2 ug/min via INTRAVENOUS
  Filled 2022-10-14: qty 250

## 2022-10-14 MED ORDER — IPRATROPIUM-ALBUTEROL 0.5-2.5 (3) MG/3ML IN SOLN
3.0000 mL | RESPIRATORY_TRACT | Status: DC
  Administered 2022-10-14 (×5): 3 mL via RESPIRATORY_TRACT
  Filled 2022-10-14 (×5): qty 3

## 2022-10-14 MED ORDER — NALOXONE HCL 0.4 MG/ML IJ SOLN: 0.4000 mg | INTRAMUSCULAR | Status: DC | PRN

## 2022-10-14 MED ORDER — DOCUSATE SODIUM 50 MG/5ML PO LIQD: 100.0000 mg | Freq: Two times a day (BID) | ORAL | Status: DC

## 2022-10-14 MED ORDER — FENTANYL BOLUS VIA INFUSION: 50.0000 ug | INTRAVENOUS | Status: DC | PRN

## 2022-10-14 MED ORDER — ORAL CARE MOUTH RINSE
15.0000 mL | OROMUCOSAL | Status: DC
  Administered 2022-10-14 (×5): 15 mL via OROMUCOSAL

## 2022-10-14 MED ORDER — POTASSIUM CHLORIDE 10 MEQ/100ML IV SOLN
10.0000 meq | INTRAVENOUS | Status: AC
  Administered 2022-10-14 (×4): 10 meq via INTRAVENOUS
  Filled 2022-10-14 (×3): qty 100

## 2022-10-14 MED ORDER — MIDAZOLAM HCL 2 MG/2ML IJ SOLN
INTRAMUSCULAR | Status: AC
  Filled 2022-10-14: qty 2

## 2022-10-14 MED ORDER — LACTATED RINGERS IV BOLUS: 500.0000 mL | Freq: Once | INTRAVENOUS | Status: DC

## 2022-10-14 MED ORDER — LINEZOLID 600 MG/300ML IV SOLN
600.0000 mg | Freq: Two times a day (BID) | INTRAVENOUS | Status: DC
  Administered 2022-10-14 – 2022-10-19 (×10): 600 mg via INTRAVENOUS
  Filled 2022-10-14 (×14): qty 300

## 2022-10-14 MED ORDER — ONDANSETRON HCL 4 MG PO TABS: 4.0000 mg | ORAL_TABLET | Freq: Four times a day (QID) | ORAL | Status: DC | PRN

## 2022-10-14 MED ORDER — ACETAMINOPHEN 10 MG/ML IV SOLN
1000.0000 mg | Freq: Four times a day (QID) | INTRAVENOUS | Status: AC
  Administered 2022-10-14 – 2022-10-15 (×4): 1000 mg via INTRAVENOUS
  Filled 2022-10-14 (×4): qty 100

## 2022-10-14 MED ORDER — DIPHENHYDRAMINE HCL 12.5 MG/5ML PO ELIX: 12.5000 mg | ORAL_SOLUTION | Freq: Four times a day (QID) | ORAL | Status: DC | PRN

## 2022-10-14 MED ORDER — KETOROLAC TROMETHAMINE 30 MG/ML IJ SOLN: 30.0000 mg | Freq: Three times a day (TID) | INTRAMUSCULAR | Status: DC | PRN

## 2022-10-14 MED ORDER — POLYETHYLENE GLYCOL 3350 17 G PO PACK: 17.0000 g | PACK | Freq: Every day | ORAL | Status: DC

## 2022-10-14 MED ORDER — FENTANYL CITRATE PF 50 MCG/ML IJ SOSY
PREFILLED_SYRINGE | INTRAMUSCULAR | Status: AC
  Filled 2022-10-14: qty 1

## 2022-10-14 MED ORDER — FENTANYL CITRATE PF 50 MCG/ML IJ SOSY: 50.0000 ug | PREFILLED_SYRINGE | Freq: Once | INTRAMUSCULAR | Status: DC

## 2022-10-14 MED ORDER — SODIUM CHLORIDE 0.9% FLUSH: 9.0000 mL | INTRAVENOUS | Status: DC | PRN

## 2022-10-14 MED ORDER — ONDANSETRON HCL 4 MG/2ML IJ SOLN: 4.0000 mg | Freq: Four times a day (QID) | INTRAMUSCULAR | Status: DC | PRN

## 2022-10-14 MED ORDER — POTASSIUM CHLORIDE 10 MEQ/100ML IV SOLN: 10.0000 meq | INTRAVENOUS | Status: DC

## 2022-10-14 MED ORDER — SODIUM CHLORIDE 0.9 % IV SOLN: INTRAVENOUS | Status: DC | PRN

## 2022-10-14 MED ORDER — FENTANYL CITRATE PF 50 MCG/ML IJ SOSY
25.0000 ug | PREFILLED_SYRINGE | INTRAMUSCULAR | Status: DC | PRN
  Administered 2022-10-14: 25 ug via INTRAVENOUS
  Filled 2022-10-14: qty 1

## 2022-10-14 MED ORDER — DIPHENHYDRAMINE HCL 50 MG/ML IJ SOLN: 12.5000 mg | Freq: Four times a day (QID) | INTRAMUSCULAR | Status: DC | PRN

## 2022-10-14 MED ORDER — KETOROLAC TROMETHAMINE 30 MG/ML IJ SOLN
30.0000 mg | Freq: Once | INTRAMUSCULAR | Status: AC
  Administered 2022-10-14: 30 mg via INTRAVENOUS
  Filled 2022-10-14: qty 1

## 2022-10-14 MED ORDER — LACTATED RINGERS IV BOLUS
500.0000 mL | Freq: Once | INTRAVENOUS | Status: AC
  Administered 2022-10-14: 500 mL via INTRAVENOUS

## 2022-10-14 MED ORDER — FENTANYL 2500MCG IN NS 250ML (10MCG/ML) PREMIX INFUSION
50.0000 ug/h | INTRAVENOUS | Status: DC
  Administered 2022-10-14: 50 ug/h via INTRAVENOUS
  Filled 2022-10-14: qty 250

## 2022-10-14 MED ORDER — HYDROMORPHONE 1 MG/ML IV SOLN
INTRAVENOUS | Status: DC
  Administered 2022-10-14: 30 mg via INTRAVENOUS
  Administered 2022-10-15: 3 mg via INTRAVENOUS
  Administered 2022-10-16: 1.2 mg via INTRAVENOUS
  Administered 2022-10-16: 30 mg via INTRAVENOUS
  Administered 2022-10-16: 1 mg via INTRAVENOUS
  Administered 2022-10-16: 1.8 mg via INTRAVENOUS
  Administered 2022-10-16: 4.2 mg via INTRAVENOUS
  Filled 2022-10-14 (×2): qty 30

## 2022-10-14 MED ORDER — VANCOMYCIN VARIABLE DOSE PER UNSTABLE RENAL FUNCTION (PHARMACIST DOSING): Status: DC

## 2022-10-14 MED ORDER — MIDAZOLAM HCL 2 MG/2ML IJ SOLN
1.0000 mg | Freq: Once | INTRAMUSCULAR | Status: AC
  Administered 2022-10-14: 1 mg via INTRAVENOUS

## 2022-10-14 MED ORDER — ALBUTEROL SULFATE HFA 108 (90 BASE) MCG/ACT IN AERS
INHALATION_SPRAY | RESPIRATORY_TRACT | Status: DC | PRN
  Administered 2022-10-13: 4 via RESPIRATORY_TRACT

## 2022-10-14 MED ORDER — ORAL CARE MOUTH RINSE: 15.0000 mL | OROMUCOSAL | Status: DC | PRN

## 2022-10-14 MED ORDER — LACTATED RINGERS IV SOLN: INTRAVENOUS | Status: DC

## 2022-10-14 MED ORDER — FENTANYL CITRATE PF 50 MCG/ML IJ SOSY
50.0000 ug | PREFILLED_SYRINGE | Freq: Once | INTRAMUSCULAR | Status: AC
  Administered 2022-10-14: 50 ug via INTRAVENOUS

## 2022-10-14 MED ORDER — CALCIUM GLUCONATE-NACL 1-0.675 GM/50ML-% IV SOLN
1.0000 g | Freq: Once | INTRAVENOUS | Status: AC
  Administered 2022-10-14: 1000 mg via INTRAVENOUS
  Filled 2022-10-14: qty 50

## 2022-10-14 MED ORDER — ALUM & MAG HYDROXIDE-SIMETH 200-200-20 MG/5ML PO SUSP: 30.0000 mL | ORAL | Status: DC | PRN

## 2022-10-14 MED ORDER — PROPOFOL 1000 MG/100ML IV EMUL
0.0000 ug/kg/min | INTRAVENOUS | Status: DC
  Administered 2022-10-14: 50 ug/kg/min via INTRAVENOUS
  Administered 2022-10-14: 30 ug/kg/min via INTRAVENOUS
  Filled 2022-10-14 (×2): qty 100

## 2022-10-14 MED ORDER — CHLORHEXIDINE GLUCONATE CLOTH 2 % EX PADS
6.0000 | MEDICATED_PAD | Freq: Every day | CUTANEOUS | Status: DC
  Administered 2022-10-14 – 2022-10-18 (×5): 6 via TOPICAL

## 2022-10-14 MED ORDER — METHOCARBAMOL 500 MG PO TABS: 500.0000 mg | ORAL_TABLET | Freq: Four times a day (QID) | ORAL | Status: DC

## 2022-10-14 NOTE — Anesthesia Procedure Notes (Deleted)
Arterial Line Insertion Start/End3/25/2024 10:38 AM, 10/14/2022 10:43 AM Performed by: Betha Loa, CRNA  Patient location: OR. Emergency situation radial was placed Catheter size: 20 G Hand hygiene performed , maximum sterile barriers used  and Seldinger technique used Allen's test indicative of satisfactory collateral circulation Attempts: 1 Procedure performed without using ultrasound guided technique. Following insertion, dressing applied and Biopatch. Post procedure assessment: normal and unchanged  Patient tolerated the procedure well with no immediate complications.

## 2022-10-14 NOTE — Anesthesia Procedure Notes (Addendum)
Arterial Line Insertion Start/End3/24/2024 11:29 PM, 10/13/2022 11:44 PM Performed by: Nolon Nations, MD, anesthesiologist  Patient location: Pre-op. Preanesthetic checklist: patient identified, IV checked, site marked, risks and benefits discussed, surgical consent, monitors and equipment checked, pre-op evaluation, timeout performed and anesthesia consent Lidocaine 1% used for infiltration Right, radial was placed Catheter size: 20 G Hand hygiene performed , maximum sterile barriers used  and Seldinger technique used  Attempts: 4 Procedure performed using ultrasound guided technique. Ultrasound Notes:anatomy identified, needle tip was noted to be adjacent to the nerve/plexus identified, no ultrasound evidence of intravascular and/or intraneural injection and image(s) printed for medical record Following insertion, dressing applied and Biopatch. Post procedure assessment: normal and unchanged  Post procedure complications: second provider assisted and unsuccessful attempts. Patient tolerated the procedure with difficulty.

## 2022-10-14 NOTE — Procedures (Signed)
Extubation Procedure Note  Patient Details:   Name: Timothy [name] DOB: October 03, 1965 MRN: [id number]   Airway Documentation:    Vent end date: 10/14/22 Vent end time: 1054   Evaluation  O2 sats: stable throughout Complications: No apparent complications Patient did tolerate procedure well. Bilateral Breath Sounds: Clear   Yes Positive cuff leak. Pt extubated to 4L [place] without complications. Pt joking and laughing with family.  Trenda Moots 10/14/2022, 10:58 AM

## 2022-10-14 NOTE — Progress Notes (Addendum)
Pharmacy Antibiotic Note  Timothy [name] is a 57 y.o. male admitted on 10/04/2022 with  abdominal wall abscess  - culture grew rare MRSA, no anaerobes. Repeat BCx drawn 3/25. Pharmacy has been consulted for Vancomycin dosing. Also on Zosyn per MD - stop date in for 5 days (end 3/25).  Noted significant AKI. SCr 0.55 >> 1.42 today, UOP down to 0.4 ml/kg/hr per documentation.  Plan: Hold vancomycin AM dose and check stat vancomycin trough - notified RN with plan Zosyn 3.375g IV q8h (4hr infusion) scheduled to end 3/25 Monitor clinical progress, c/s, renal function F/u de-escalation plan/LOT, vancomycin levels as indicated  ADDENDUM - vancomycin trough resulted at 14, drawn ~1 hour late. Per discussion with Dr. Marlou Starks, will transition vancomycin to linezolid IV for now and monitor.    Height: 5\' 9"  (175.3 cm) Weight: 72.6 kg (160 lb) IBW/kg (Calculated) : 70.7  Temp (24hrs), Avg:98.7 F (37.1 C), Min:98.4 F (36.9 C), Max:99.1 F (37.3 C)  Recent Labs  Lab 10/08/22 0040 10/09/22 0119 10/10/22 0323 10/11/22 0353 10/12/22 0315 10/12/22 1219 10/13/22 1829 10/14/22 0752  WBC 10.3 10.6* 13.9*  --   --   --  23.1* 17.7*  CREATININE 0.65 0.43* 0.53* 0.44* 0.55*  --   --  1.42*  VANCOTROUGH  --   --   --   --   --  6*  --   --   VANCOPEAK  --   --   --   --  42*  --   --   --      Estimated Creatinine Clearance: 59.5 mL/min (A) (by C-G formula based on SCr of 1.42 mg/dL (H)).    Allergies  Allergen Reactions   Ambien [Zolpidem Tartrate] Other (See Comments)    Caused agitation of mood.    Bactroban [Mupirocin Calcium] Swelling    Caused infection at a surgical site   Darvocet [Propoxyphene N-Acetaminophen] Hives and Itching   Zestril [Lisinopril] Cough   Morphine And Related Itching    Arturo Morton, PharmD, BCPS Please check AMION for all [place] contact numbers Clinical Pharmacist 10/14/2022 9:42 AM

## 2022-10-14 NOTE — Progress Notes (Signed)
1 Day Post-Op   Subjective/Chief Complaint: Resting comfortably. Intubated and sedated on vent. Events of last night reviewed   Objective: Vital signs in last 24 hours: Temp:  [98.4 F (36.9 C)-99.1 F (37.3 C)] 99.1 F (37.3 C) (03/24 2130) Pulse Rate:  [98-189] 98 (03/25 0700) Resp:  [16-23] 18 (03/25 0700) BP: (85-158)/(61-113) 91/64 (03/25 0700) SpO2:  [90 %-100 %] 97 % (03/25 0700) FiO2 (%):  [40 %-100 %] 40 % (03/25 0434) Last BM Date : 10/10/22  Intake/Output from previous day: 03/24 0701 - 03/25 0700 In: 7260.3 [I.V.:4473.2; IV Piggyback:2787.1] Out: 1060 [Urine:750; Drains:90; Stool:20; Blood:200] Intake/Output this shift: No intake/output data recorded.  General appearance: sedated on vent Resp: clear to auscultation bilaterally and on vent Cardio: regular rate and rhythm GI: soft, wound clean. Ostomy pink  Lab Results:  Recent Labs    10/13/22 1829 10/13/22 2250 10/14/22 0102  WBC 23.1*  --   --   HGB 11.1* 10.2* 8.8*  HCT 34.5* 30.0* 26.0*  PLT 694*  --   --    BMET Recent Labs    10/12/22 0315 10/13/22 2250 10/14/22 0102  NA 138 133* 135  K 4.1 3.8 3.3*  CL 104  --   --   CO2 28  --   --   GLUCOSE 124*  --   --   BUN <5*  --   --   CREATININE 0.55*  --   --   CALCIUM 8.1*  --   --    PT/INR No results for input(s): "LABPROT", "INR" in the last 72 hours. ABG Recent Labs    10/13/22 2250 10/14/22 0102  PHART 7.364 7.308*  HCO3 27.8 22.1    Studies/Results: CT ABDOMEN PELVIS W CONTRAST  Result Date: 10/14/2022 CLINICAL DATA:  Vomiting EXAM: CT ABDOMEN AND PELVIS WITH CONTRAST TECHNIQUE: Multidetector CT imaging of the abdomen and pelvis was performed using the standard protocol following bolus administration of intravenous contrast. RADIATION DOSE REDUCTION: This exam was performed according to the departmental dose-optimization program which includes automated exposure control, adjustment of the mA and/or kV according to patient size  and/or use of iterative reconstruction technique. CONTRAST:  26mL ISOVUE-370 IOPAMIDOL (ISOVUE-370) INJECTION 76% COMPARISON:  08/14/2022 FINDINGS: Lower chest: Patchy atelectatic changes are noted in the bases bilaterally. Hepatobiliary: No focal liver abnormality is seen. No gallstones, gallbladder wall thickening, or biliary dilatation. Pancreas: Unremarkable. No pancreatic ductal dilatation or surrounding inflammatory changes. Spleen: Normal in size without focal abnormality. Adrenals/Urinary Tract: Adrenal glands are within normal limits. Kidneys demonstrate a normal enhancement pattern bilaterally. No renal calculi or obstructive changes are seen. The bladder is decompressed. Stomach/Bowel: Postsurgical changes are noted is at the junction of the descending and sigmoid colons with a defect at the anastomotic site and adjacent free air. There is extensive intraperitoneal air identified consistent with this disruption. The more proximal colon appears within normal limits. The appendix is not visualized consistent with a prior surgical history. Stomach is well distended with fluid. Some mildly dilated loops of small bowel are not identified likely reactive to a large intra-abdominal air-fluid collection in the left mid abdomen. This measures approximately 12.6 x 8.5 cm consistent with postoperative abscess. This is best seen on image number 61 of series 3. Adjacent to this is a smaller air-fluid collection measuring 7.4 x 3.6 cm. These 2 air-fluid collections may inter connect although it is difficult to demonstrate on this exam. Vascular/Lymphatic: Aortic atherosclerosis. No enlarged abdominal or pelvic lymph nodes. Reproductive: Prostate  is unremarkable. Other: No focal hernia is noted. Previously seen subcutaneous abscess has resolved following prior surgery. Open anterior wound is noted with packing material within. Musculoskeletal: No acute or significant osseous findings. IMPRESSION: Considerable amount of  free intraperitoneal air related to breakdown of colonic anastomosis in the left lower quadrant best seen on image number 69 of series 3. Additionally there are 2 large air-fluid collections consistent with postoperative abscess. Associated small bowel dilatation is noted likely of a reactive nature. These findings were not communicated to the referring clinician at this time as the patient has already return to the operating room prior to the interpretation of this exam. Electronically Signed   By: Inez Catalina M.D.   On: 10/14/2022 02:23   DG Abd Portable 1V  Result Date: 10/14/2022 CLINICAL DATA:  Check gastric catheter placement EXAM: PORTABLE ABDOMEN - 1 VIEW COMPARISON:  None Available. FINDINGS: Gastric catheter is noted in the distal esophagus. This should be advanced several cm deeper into the stomach. IMPRESSION: Gastric catheter in the distal esophagus. This should be advanced deeper into the stomach. Electronically Signed   By: Inez Catalina M.D.   On: 10/14/2022 02:06   DG CHEST PORT 1 VIEW  Result Date: 10/14/2022 CLINICAL DATA:  Check endotracheal tube and gastric catheter placement, initial encounter EXAM: PORTABLE CHEST 1 VIEW COMPARISON:  10/11/2022 FINDINGS: Cardiac shadow is stable. Endotracheal tube is noted 5 cm above the carina. Gastric catheter is seen in the distal esophagus well short of the stomach. This should be advanced several cm. The lungs are clear. Free air is noted beneath the right hemidiaphragm stable from the prior exam. IMPRESSION: Tubes and lines as described. Gastric catheter should be advanced deeper into the stomach. Free intraperitoneal air beneath the right hemidiaphragm similar to that seen on the prior exam. Electronically Signed   By: Inez Catalina M.D.   On: 10/14/2022 02:06    Anti-infectives: Anti-infectives (From admission, onward)    Start     Dose/Rate Route Frequency Ordered Stop   10/10/22 0800  piperacillin-tazobactam (ZOSYN) IVPB 3.375 g         3.375 g 12.5 mL/hr over 240 Minutes Intravenous Every 8 hours 10/10/22 0728 10/15/22 0759   10/09/22 0045  vancomycin (VANCOCIN) IVPB 1000 mg/200 mL premix        1,000 mg 200 mL/hr over 60 Minutes Intravenous Every 12 hours 10/08/22 1158     10/08/22 1245  vancomycin (VANCOREADY) IVPB 1250 mg/250 mL        1,250 mg 166.7 mL/hr over 90 Minutes Intravenous  Once 10/08/22 1158 10/08/22 1639   10/04/22 1700  piperacillin-tazobactam (ZOSYN) IVPB 3.375 g        3.375 g 12.5 mL/hr over 240 Minutes Intravenous Every 8 hours 10/04/22 1613 10/09/22 1416   10/04/22 0615  ceFAZolin (ANCEF) IVPB 2g/100 mL premix        2 g 200 mL/hr over 30 Minutes Intravenous On call to O.R. 10/04/22 0601 10/04/22 1131       Assessment/Plan: s/p Procedure(s): EXPLORATORY LAPAROTOMY (N/A) RESECTION OF COLON ANASTOMOSIS AND END COLOSCOPY Continue bowel rest VDRF per CCM Continue IV vanc and zosyn Ostomy care Hold antihypertensives for now until hemodynamics improve POD 10/1 Lovenox for vte prophylaxis  LOS: 10 days    Timothy [name] 10/14/2022

## 2022-10-14 NOTE — Progress Notes (Addendum)
Nutrition Follow-up  DOCUMENTATION CODES:   Non-severe (moderate) malnutrition in context of social or environmental circumstances  INTERVENTION:  - Recommend initiation of TPN as soon as possible.   NUTRITION DIAGNOSIS:   Moderate Malnutrition related to social / environmental circumstances as evidenced by mild fat depletion, moderate muscle depletion. - Ongoing.   GOAL:   Patient will meet greater than or equal to 90% of their needs - Not met.   MONITOR:   Diet advancement, Labs, Weight trends, I & O's  REASON FOR ASSESSMENT:   Malnutrition Screening Tool    ASSESSMENT:   57 year old male who presented on 3/15 with abdominal wall abscess. PMH of GERD, anxiety, ETOH abuse, depression, HTN, bipolar disorder, hx colostomy and colostomy takedown.  Meds reviewed:  colace, miralax. Labs reviewed: Na low.   Pt is currently NPO and recently extubated. Surgery continues to follow pt and is recommending bowel rest. Pt was NPO/CL x6 days upon admission. Pt was then advanced to a full liquid diet x2 days; and then soft diet x1 day; Pt switched back to NPO on 3/24. Per documentation, pt had eaten some potato soup during diet being ordered and had eaten 100%. However, pt is unable to meet his nutritional needs at this time.   RD messaged critical care MD about starting TPN to help pt meet his nutritional needs. MD states he will defer to surgery. RD will continue to monitor POC.   Diet Order:   Diet Order             Diet NPO time specified  Diet effective now                   EDUCATION NEEDS:   Education needs have been addressed  Skin:  Skin Assessment: Skin Integrity Issues: Skin Integrity Issues:: Incisions Incisions: closed abdomen  Last BM:  type 1 - 3/24  Height:   Ht Readings from Last 1 Encounters:  10/14/22 5\' 9"  (1.753 m)    Weight:   Wt Readings from Last 1 Encounters:  10/04/22 72.6 kg    Ideal Body Weight:     BMI:  Body mass index is  23.63 kg/m.  Estimated Nutritional Needs:   Kcal:  [id number] kcals  Protein:  100-145 gm  Fluid:  >/= 2.1 L  Thalia Bloodgood, RD, LDN, CNSC.

## 2022-10-14 NOTE — Anesthesia Postprocedure Evaluation (Signed)
Anesthesia Post Note  Patient: Timothy [name]  Procedure(s) Performed: EXPLORATORY LAPAROTOMY RESECTION OF COLON ANASTOMOSIS AND END COLOSCOPY (Abdomen)     Patient location during evaluation: PACU Anesthesia Type: General Level of consciousness: sedated and patient cooperative Pain management: pain level controlled Vital Signs Assessment: post-procedure vital signs reviewed and stable Respiratory status: spontaneous breathing Cardiovascular status: stable Anesthetic complications: no   No notable events documented.  Last Vitals:  Vitals:   10/14/22 2054 10/14/22 2100  BP: (!) 92/56 (!) 92/56  Pulse: 95 (!) 101  Resp: 13 20  Temp:    SpO2: 94% (!) 77%    Last Pain:  Vitals:   10/14/22 2000  TempSrc: Oral  PainSc: [place]

## 2022-10-14 NOTE — Transfer of Care (Signed)
Immediate Anesthesia Transfer of Care Note  Patient: Timothy [name]  Procedure(s) Performed: EXPLORATORY LAPAROTOMY RESECTION OF COLON ANASTOMOSIS AND END COLOSCOPY (Abdomen)  Patient Location: ICU  Anesthesia Type:General  Level of Consciousness: sedated and Patient remains intubated per anesthesia plan  Airway & Oxygen Therapy: Patient remains intubated per anesthesia plan and Patient placed on Ventilator (see vital sign flow sheet for setting)  Post-op Assessment: Report given to RN and Post -op Vital signs reviewed and stable  Post vital signs: Reviewed and stable  Last Vitals:  Vitals Value Taken Time  BP 147/74   Temp    Pulse 114 10/14/22 0032  Resp 15 10/14/22 0032  SpO2 100 % 10/14/22 0032  Vitals shown include unvalidated device data.  Last Pain:  Vitals:   10/13/22 2130  TempSrc: Oral  PainSc:       Patients Stated Pain Goal: 3 ([id number])  Complications: No notable events documented.

## 2022-10-14 NOTE — Progress Notes (Addendum)
[facility] Progress Note Patient Name: Kash Mclinn DOB: 02-14-1966 MRN: [id number]   Date of Service  10/14/2022  HPI/Events of Note  56/M who is s/p ex lap, drainage abdominal wall abscess, removal of mesh, segmental colon resection, lysis of adhesions.  Pt noted to have abdominal distention with CT showing large amount of free air and concern for anastomotic leak.   Pt is s/p EL, resection of colon anastomosis with intra-abdominal abscess.  BP 124/89, HR 114, RR 16, o2 sats 100%.  Pt is intubated and sedated.   ABG 7.364/pCO2 48, PO2 71 H&H 10.2/30  eICU Interventions  Vancomycin and Zosyn. ?Add anti fungal.  Get CXR to confirm ETT placement and abdominal xray for enteral tube placement.  Start on fentanyl gtt. Propofol ordered.  Start on LR.  Replete K - 52meq IV Kcl x 4 doses ordered. PPI.  Replete calcium - 1g calcium gluconate ordered.  Bilateral wrist restraints ordered. Lovenox for DVT prophylaxis.      Intervention Category Evaluation Type: New Patient Evaluation  Elsie Lincoln 10/14/2022, 12:43 AM

## 2022-10-14 NOTE — Consult Note (Signed)
NAME:  Timothy [name], MRN:  [id number], DOB:  09-22-1965, LOS: 38 ADMISSION DATE:  10/04/2022, CONSULTATION DATE:   REFERRING MD:  Ninfa Linden, CHIEF COMPLAINT:  abdominal surgery   History of Present Illness:  57 yo man with hx of Ex lap pod #9, abdominal wall abscess, removal of mesh, segmental resection of colon, LOA.   Developed nausea and emesis today.   Developed Sinus tach, AMS.  Large free air and intraabdominal abscess and free fluid.  Tenderness and guarding in L abdomen.   Underwent emergent ex lap 10/12/12 evening.   In OR: found to have anastamotic leak with intraabdominal abscess. Had resection of colon anastamosis.   Prior to procedure was satting 95% on RA.  Had been getting dilaudid PCA pain control prior to surgery   On Zosyn, since 3/15 Vanc since 3/19  Pertinent  Medical History  HTN  Emphysema Smoker 6 beers daily   Home meds: albuterol, buspar, cyclobenzaprine, gabapentin, losartan, zofran, protonix, sertraline, anoro ellipta, varenicline  Significant Hospital Events: Including procedures, antibiotic start and stop dates in addition to other pertinent events     Interim History / Subjective:    Objective   Blood pressure (!) 130/103, pulse (!) 134, temperature 99.1 F (37.3 C), temperature source Oral, resp. rate 20, height 5\' 9"  (1.753 m), weight 72.6 kg, SpO2 93 %.    FiO2 (%):  [32 %] 32 %   Intake/Output Summary (Last 24 hours) at 10/14/2022 0039 Last data filed at 10/14/2022 0030 Gross per 24 hour  Intake 2250 ml  Output 1150 ml  Net 1100 ml   Filed Weights   10/04/22 0609  Weight: 72.6 kg    Examination: General: intubated, intermittently waking up and uncomfortable appearing  HENT: NCAT  Lungs: ctab Cardiovascular: sinus tach no mgr  Abdomen: midline wound, ostomy (serosang fluid in ostomy bag and peritoneal drain) Extremities: No edema or erythema  Neuro: waking up intermittently  GU:   Resolved Hospital Problem list      Assessment & Plan:  S/p colonic anastamosis resection, ostomy , /sp intraabd abscess   Hypotension alt with hypertension  Hypoxemia (on fio2 60% now, sat 92%)  Remained intubated post op for concern re hypoxemia.  Satting well but requ higher O2 concentrations.   CXR looks good.   Resp mechanics look good on vent.  Cont mech vent for now, adjust sedation as needed.  Fentanyl propofol.  May need to come off propofol and try precedex or benzos given BP lability.  Trial of fluid bolus 500cc LR.   Oozing from abd stoma and drain.  Check cbc and INR.   Check chemistry panel.   HTN Emphysema  Best Practice (right click and "Reselect all SmartList Selections" daily)   Diet/type: NPO DVT prophylaxis: other GI prophylaxis: PPI Lines: N/A Foley:  N/A Code Status:  full code Last date of multidisciplinary goals of care discussion []   Labs   CBC: Recent Labs  Lab 10/07/22 0927 10/08/22 0040 10/09/22 0119 10/10/22 0323 10/13/22 1829 10/13/22 2250  WBC 11.4* 10.3 10.6* 13.9* 23.1*  --   NEUTROABS  --   --   --   --  19.7*  --   HGB 10.2* 9.5* 9.7* 11.2* 11.1* 10.2*  HCT 32.2* 30.2* 31.1* 34.9* 34.5* 30.0*  MCV 100.6* 101.3* 101.3* 99.1 96.4  --   PLT 271 275 296 360 694*  --     Basic Metabolic Panel: Recent Labs  Lab 10/08/22 0040 10/09/22 0119 10/10/22 0323 10/11/22  [id number] 10/12/22 0315 10/13/22 2250  NA 143 143 139 137 138 133*  K 3.4* 3.2* 3.2* 3.4* 4.1 3.8  CL 106 107 102 102 104  --   CO2 26 28 26 25 28   --   GLUCOSE 99 131* 105* 99 124*  --   BUN 7 <5* <5* <5* <5*  --   CREATININE 0.65 0.43* 0.53* 0.44* 0.55*  --   CALCIUM 8.6* 8.6* 8.8* 8.3* 8.1*  --   MG  --   --  1.3* 1.5* 1.7  --    GFR: Estimated Creatinine Clearance: 105.6 mL/min (A) (by C-G formula based on SCr of 0.55 mg/dL (L)). Recent Labs  Lab 10/08/22 0040 10/09/22 0119 10/10/22 0323 10/13/22 1829  WBC 10.3 10.6* 13.9* 23.1*    Liver Function Tests: No results for input(s): "AST",  "ALT", "ALKPHOS", "BILITOT", "PROT", "ALBUMIN" in the last 168 hours. No results for input(s): "LIPASE", "AMYLASE" in the last 168 hours. No results for input(s): "AMMONIA" in the last 168 hours.  ABG    Component Value Date/Time   PHART 7.364 10/13/2022 2250   PCO2ART 48.7 (H) 10/13/2022 2250   PO2ART 71 (L) 10/13/2022 2250   HCO3 27.8 10/13/2022 2250   TCO2 29 10/13/2022 2250   O2SAT 93 10/13/2022 2250     Coagulation Profile: No results for input(s): "INR", "PROTIME" in the last 168 hours.  Cardiac Enzymes: No results for input(s): "CKTOTAL", "CKMB", "CKMBINDEX", "TROPONINI" in the last 168 hours.  HbA1C: Hemoglobin A1C  Date/Time Value Ref Range Status  10/29/2011 10:15 AM 5.2  Final    CBG: No results for input(s): "GLUCAP" in the last 168 hours.  Review of Systems:   Unable to assess  Past Medical History:  He,  has a past medical history of Acute bronchitis (02/15/2013), Alcohol abuse, Anxiety, Bipolar disorder ([place]), Bronchitis, Bronchitis, Cellulitis, Chronic pain, Complication of anesthesia, Condyloma (02/04/2012), Depression, Diverticulosis, Dyspnea, Elevated CK (09/2011), Emphysema lung ([place]), Emphysema of lung ([place]), Family history of anesthesia complication, GERD (gastroesophageal reflux disease), Glaucoma syndrome, Headache(784.0), Heavy smoker, Hemorrhoids, History of colostomy (03/21/2012), History of diverticulitis of colon (10/16/2011), History of dizziness, History of stomach ulcers (2005), HPV (human papilloma virus) infection, Hypertension, Hypoglycemia, Incisional hernia, Lower GI bleed, Migraine headache (08/23/2012), Migraines, 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   Surgical History:   Past Surgical History:  Procedure Laterality Date   ABDOMINAL EXPLORATION SURGERY  07/23/2012   w/LOA (07/23/2012)   [place] RESECTION  02/27/2012   Procedure: COLON RESECTION;   Surgeon: Merrie Roof, MD;  Location: [place];  Service: General;  Laterality: N/A;   COLON RESECTION N/A 10/04/2022   Procedure: SEGMENTAL COLON RESECTION;  Surgeon: Jovita Kussmaul, MD;  Location: [place];  Service: General;  Laterality: N/A;   COLOSTOMY  02/27/2012   Procedure: COLOSTOMY;  Surgeon: Merrie Roof, MD;  Location: [place];  Service: General;  Laterality: N/A;   COLOSTOMY TAKEDOWN  07/23/2012   COLOSTOMY TAKEDOWN  07/23/2012   Procedure: COLOSTOMY TAKEDOWN;  Surgeon: Merrie Roof, MD;  Location: [place];  Service: General;  Laterality: N/A;  Primary Anastomosis   ELBOW FRACTURE SURGERY  ~ 1975   left;  pins inserted    EXCISION OF MESH N/A 10/04/2022   Procedure: REMOVAL OF MESH;  Surgeon: Jovita Kussmaul, MD;  Location: [facility];  Service: General;  Laterality: N/A;   INCISION AND DRAINAGE ABSCESS N/A  04/05/2021   Procedure: INCISION AND DRAINAGE ABDOMINAL WALL ABSCESS;  Surgeon: Jovita Kussmaul, MD;  Location: [facility];  Service: General;  Laterality: N/A;   INSERTION OF MESH N/A 05/20/2013   Procedure: INSERTION OF MESH;  Surgeon: Merrie Roof, MD;  Location: [place];  Service: General;  Laterality: N/A;   IRRIGATION AND DEBRIDEMENT ABSCESS N/A 10/04/2022   Procedure: DRAINAGE ABDOMIAL WALL ABSCESS;  Surgeon: Jovita Kussmaul, MD;  Location: [place];  Service: General;  Laterality: N/A;   LAPAROSCOPIC LEFT COLON RESECTION  02/19/2012   SIGMOID   LAPAROTOMY  02/27/2012   Procedure: EXPLORATORY LAPAROTOMY;  Surgeon: Merrie Roof, MD;  Location: [place];  Service: General;  Laterality: N/A;   LAPAROTOMY  07/23/2012   Procedure: EXPLORATORY LAPAROTOMY;  Surgeon: Merrie Roof, MD;  Location: [place];  Service: General;  Laterality: N/A;   LAPAROTOMY N/A 10/04/2022   Procedure: EXPLORATORY LAPAROTOMY;  Surgeon: Jovita Kussmaul, MD;  Location: [place];  Service: General;  Laterality: N/A;   LESION DESTRUCTION  07/23/2012   Procedure: DESTRUCTION LESION ANUS;  Surgeon: Merrie Roof, MD;  Location: [place];   Service: General;  Laterality: N/A;  DESTRUCTION ANAL CONDYLOMA   LYSIS OF ADHESION  07/23/2012   Procedure: LYSIS OF ADHESION;  Surgeon: Merrie Roof, MD;  Location: [facility];  Service: General;  Laterality: N/A;   LYSIS OF ADHESION N/A 10/04/2022   Procedure: LYSIS OF ADHESION;  Surgeon: Jovita Kussmaul, MD;  Location: [facility];  Service: General;  Laterality: N/A;   VENTRAL HERNIA REPAIR  05/20/2013   Dr Marlon Pel HERNIA REPAIR N/A 05/20/2013   Procedure: VENTRAL HERNIA REPAIR ;  Surgeon: Merrie Roof, MD;  Location: [place];  Service: General;  Laterality: N/A;   WART FULGURATION  02/19/2012   Procedure: FULGURATION ANAL WART;  Surgeon: Merrie Roof, MD;  Location: [place] [facility];  Service: General;  Laterality: N/A;  Destroy  Anal Condyloma    WISDOM TOOTH EXTRACTION  ~ [place] N/A 06/03/2016   Procedure: ABDOMINAL WOUND EXPLORATION AND STITCH REMOVAL;  Surgeon: [name], MD;  Location: [place];  Service: General;  Laterality: N/A;  ABDOMINAL WOUND EXPLORATION AND STITCH REMOVAL     Social History:   reports that he has been smoking cigarettes. He started smoking about 41 years ago. He has a 68.00 pack-year smoking history. He quit smokeless tobacco use about 11 years ago.  His smokeless tobacco use included chew. He reports that he does not currently use alcohol. He reports current drug use. Drugs: Oxycodone and Marijuana.   Family History:  His family history includes Alcohol abuse in his father; Anxiety disorder in his father; Breast cancer in his maternal aunt; Colon polyps in his father; Depression in his mother; Diabetes in his maternal grandmother and mother; Hypertension in his father; Parkinsonism in his mother; Ulcers in his father. There is no history of Stroke, Heart disease, Colon cancer, or Stomach cancer.   Allergies Allergies  Allergen Reactions   Ambien [Zolpidem Tartrate] Other (See Comments)    Caused agitation of mood.    Bactroban  [Mupirocin Calcium] Swelling    Caused infection at a surgical site   Darvocet [Propoxyphene N-Acetaminophen] Hives and Itching   Zestril [Lisinopril] Cough   Morphine And Related Itching     Home Medications  Prior to Admission medications   Medication Sig Start Date  End Date Taking? Authorizing Provider  albuterol (PROVENTIL) (2.5 MG/3ML) 0.083% nebulizer solution Take 3 mLs (2.5 mg total) by nebulization every 6 (six) hours as needed for wheezing or shortness of breath. 05/19/20  Yes Matilde Haymaker, MD  albuterol (VENTOLIN HFA) 108 (90 Base) MCG/ACT inhaler Inhale 2 puffs into the lungs every 6 (six) hours as needed for wheezing or shortness of breath. 06/24/22  Yes Gerrit Heck, MD  busPIRone (BUSPAR) 15 MG tablet TAKE 1 TABLET BY MOUTH IN THE MORNING. 09/23/22  Yes Dameron, Luna Fuse, DO  cyclobenzaprine (FLEXERIL) 10 MG tablet TAKE 1 TABLET BY MOUTH DAILY AS NEEDED FOR MUSCLE SPASM. NEED OFFICE VISIT FOR REFILLS 10/01/22  Yes Gerrit Heck, MD  gabapentin (NEURONTIN) 100 MG capsule TAKE 1 CAPSULE BY MOUTH FOUR TIMES DAILY AS NEEDED Patient taking differently: Take 100 mg by mouth 4 (four) times daily as needed (Neuropathic pain). 06/24/22  Yes Gerrit Heck, MD  losartan (COZAAR) 100 MG tablet TAKE 1 TABLET (100 MG TOTAL) BY MOUTH DAILY. 09/23/22  Yes Dameron, Luna Fuse, DO  pantoprazole (PROTONIX) 40 MG tablet TAKE 1 TABLET BY MOUTH DAILY 09/23/22  Yes Dameron, Luna Fuse, DO  sertraline (ZOLOFT) 100 MG tablet TAKE 1 TABLET (100 MG TOTAL) BY MOUTH DAILY. 08/29/22  Yes Gerrit Heck, MD  benzonatate (TESSALON PERLES) 100 MG capsule Take 1 capsule (100 mg total) by mouth 3 (three) times daily as needed for cough. Patient not taking: Reported on 10/01/2022 09/02/22   Gerrit Heck, MD  ondansetron (ZOFRAN-ODT) 4 MG disintegrating tablet Take 1 tablet (4 mg total) by mouth every 8 (eight) hours as needed for nausea or vomiting. 06/24/22   Gerrit Heck, MD  umeclidinium-vilanterol (ANORO ELLIPTA)  62.5-25 MCG/ACT AEPB Inhale 1 puff into the lungs daily. Patient not taking: Reported on 10/01/2022 09/02/22   Gerrit Heck, MD  varenicline (CHANTIX) 0.5 MG tablet Take 1 tablet (0.5 mg total) by mouth 2 (two) times daily with a meal. Once daily for the first week then twice daily Patient not taking: Reported on 08/08/2022 07/01/22   Zenia Resides, MD  lurasidone (LATUDA) 40 MG TABS tablet TAKE 1 TABLET(40 MG) BY MOUTH DAILY WITH BREAKFAST 05/12/19 05/08/20  Nuala Alpha, MD  mometasone-formoterol (DULERA) 100-5 MCG/ACT AERO Inhale 2 puffs into the lungs 2 (two) times daily. 02/22/19 05/19/20  Nuala Alpha, MD     Critical care time: 45 min

## 2022-10-14 NOTE — Progress Notes (Signed)
Patient is refusing the PICC team from placing their line. He states we are being disrespectful by trying to push this decision on him and that he has been stuck all day long. Charge RN spoke with patient and explained the importance of this catheter for him to receive his TPN nutrition and that this specialty team cannot come back today to complete this procedure. Patient says he wants to visit with his family and will not wait 45 min for this line to be placed. He states that they can do it tomorrow and he refuses it today. CCM and CCS were made aware.   Patient has now changed his mind and the team has gone on to another patient. They will see him tomorrow when they are able.   Magie Ciampa, Rande Brunt, RN

## 2022-10-14 NOTE — Progress Notes (Signed)
NAME:  Timothy [name], MRN:  [id number], DOB:  05/12/1966, LOS: 78 ADMISSION DATE:  10/04/2022, CONSULTATION DATE:  10/14/22  REFERRING MD:  Dr Ninfa Linden, CHIEF COMPLAINT:  Abdominal Abscess   History of Present Illness:  57 yo man with hx of Ex lap pod #10, abdominal wall abscess, removal of mesh, segmental resection of colon, LOA. Now pod #1 from ex lap last night, colon anastomosis resection after CT abdomen showed free air and intraabdominal abscess.     In OR: found to have anastamotic leak with intraabdominal abscess. Had resection of colon anastamosis.    Prior to procedure was satting 95% on RA.  Had been getting dilaudid PCA pain control prior to surgery  Currently on Vancomycin, Zosyn   Pertinent  Medical History  Hypertension Emphysema Tobacco Use Alcohol Use (6 beers per day)  Significant Hospital Events: Including procedures, antibiotic start and stop dates in addition to other pertinent events   3/15 ex lap for drainage of abdominal wall abscess, segmental colon resection 3/15 Zosyn started 3/19 Vancomycin started 3/24 emergent ex lap after CT showed free air, intraabdominal abscess  3/25 patient extubated  Interim History / Subjective:  Overnight, patient was started on fentanyl gtt then became hypotensive with systolic pressures in the 70s. Was started on levophed peripherally. Potassium and calcium were repleted. Nursing staff had nothing additional to report.   Objective   Blood pressure (!) 93/57, pulse (!) 101, temperature 98.5 F (36.9 C), temperature source Oral, resp. rate 20, height 5\' 9"  (1.753 m), weight 72.6 kg, SpO2 100 %.    Vent Mode: PSV;CPAP FiO2 (%):  [40 %-100 %] 40 % Set Rate:  [16 bmp] 16 bmp Vt Set:  [570 mL] 570 mL PEEP:  [5 cmH20-8 cmH20] 5 cmH20 Pressure Support:  [5 cmH20] 5 cmH20 Plateau Pressure:  [21 cmH20] 21 cmH20   Intake/Output Summary (Last 24 hours) at 10/14/2022 0934 Last data filed at 10/14/2022 0800 Gross per 24 hour   Intake 7719.29 ml  Output 1060 ml  Net 6659.29 ml   Filed Weights   10/04/22 0609  Weight: 72.6 kg    Examination: Constitutional: Intubated patient in no acute distress.  HENT: Normocephalic and atraumatic, moist mucous membranes Cardiovascular: Normal rate, regular rhythm, S1 and S2 present, no murmurs, rubs, gallops.  Distal pulses intact. Respiratory: Lungs are clear to auscultation bilaterally. GI: Soft. Non-distended. No appreciable organomegaly. LUQ ostomy bag with bright red blood. No stool present. Musculoskeletal: Normal bulk and tone.  No peripheral edema noted. Neurological: Awake and alert, oriented x4. No focal neuro deficits. Skin: Warm and dry.  No rash, erythema, lesions noted.    Resolved Hospital Problem list     Assessment & Plan:  Intraabdominal Abscess s/p emergent ex lap 3/24  - Surgery following  - Vancomycin  - Bright red blood in ostomy bag, check CBC  Hypotension: Chronically hypertensive patient with systolic pressures down to 70s overnight likely secondary to propofol, fentanyl gtt.  - Levophed started early this AM  - Consider switching propofol to Precedex given labile blood pressures  - Consider central line for pressors  Hypoxemia: Satting well on ventilator.  - Patient extubated.  Best Practice (right click and "Reselect all SmartList Selections" daily)   Diet/type: tubefeeds DVT prophylaxis: LMWH GI prophylaxis: PPI Lines: N/A Foley:  Yes, and it is still needed Code Status:  full code Last date of multidisciplinary goals of care discussion []   Labs   CBC: Recent Labs  Lab 10/08/22 0040 10/09/22  [id number] 10/10/22 0323 10/13/22 1829 10/13/22 2250 10/14/22 0102 10/14/22 0752  WBC 10.3 10.6* 13.9* 23.1*  --   --  17.7*  NEUTROABS  --   --   --  19.7*  --   --  15.3*  HGB 9.5* 9.7* 11.2* 11.1* 10.2* 8.8* 8.8*  HCT 30.2* 31.1* 34.9* 34.5* 30.0* 26.0* 27.8*  MCV 101.3* 101.3* 99.1 96.4  --   --  98.2  PLT 275 296 360 694*  --    --  571*    Basic Metabolic Panel: Recent Labs  Lab 10/09/22 0119 10/10/22 0323 10/11/22 0353 10/12/22 0315 10/13/22 2250 10/14/22 0102 10/14/22 0752  NA 143 139 137 138 133* 135 134*  K 3.2* 3.2* 3.4* 4.1 3.8 3.3* 5.0  CL 107 102 102 104  --   --  101  CO2 28 26 25 28   --   --  25  GLUCOSE 131* 105* 99 124*  --   --  137*  BUN <5* <5* <5* <5*  --   --  9  CREATININE 0.43* 0.53* 0.44* 0.55*  --   --  1.42*  CALCIUM 8.6* 8.8* 8.3* 8.1*  --   --  8.0*  MG  --  1.3* 1.5* 1.7  --   --   --    GFR: Estimated Creatinine Clearance: 59.5 mL/min (A) (by C-G formula based on SCr of 1.42 mg/dL (H)). Recent Labs  Lab 10/09/22 0119 10/10/22 0323 10/13/22 1829 10/14/22 0752  WBC 10.6* 13.9* 23.1* 17.7*    Liver Function Tests: Recent Labs  Lab 10/14/22 0752  AST 15  ALT 14  ALKPHOS 65  BILITOT 1.0  PROT 4.5*  ALBUMIN 1.6*   No results for input(s): "LIPASE", "AMYLASE" in the last 168 hours. No results for input(s): "AMMONIA" in the last 168 hours.  ABG    Component Value Date/Time   PHART 7.308 (L) 10/14/2022 0102   PCO2ART 44.1 10/14/2022 0102   PO2ART 343 (H) 10/14/2022 0102   HCO3 22.1 10/14/2022 0102   TCO2 23 10/14/2022 0102   ACIDBASEDEF 4.0 (H) 10/14/2022 0102   O2SAT 100 10/14/2022 0102     Coagulation Profile: Recent Labs  Lab 10/14/22 0752  INR 1.3*    Cardiac Enzymes: No results for input(s): "CKTOTAL", "CKMB", "CKMBINDEX", "TROPONINI" in the last 168 hours.  HbA1C: Hemoglobin A1C  Date/Time Value Ref Range Status  10/29/2011 10:15 AM 5.2  Final    CBG: Recent Labs  Lab 10/14/22 0745  GLUCAP 116*    Review of Systems:     Past Medical History:  He,  has a past medical history of Acute bronchitis (02/15/2013), Alcohol abuse, Anxiety, Bipolar disorder ([place]), Bronchitis, Bronchitis, Cellulitis, Chronic pain, Complication of anesthesia, Condyloma (02/04/2012), Depression, Diverticulosis, Dyspnea, Elevated CK (09/2011), Emphysema lung  ([place]), Emphysema of lung ([place]), Family history of anesthesia complication, GERD (gastroesophageal reflux disease), Glaucoma syndrome, Headache(784.0), Heavy smoker, Hemorrhoids, History of colostomy (03/21/2012), History of diverticulitis of colon (10/16/2011), History of dizziness, History of stomach ulcers (2005), HPV (human papilloma virus) infection, Hypertension, Hypoglycemia, Incisional hernia, Lower GI bleed, Migraine headache (08/23/2012), Migraines, 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   Surgical History:   Past Surgical History:  Procedure Laterality Date   ABDOMINAL EXPLORATION SURGERY  07/23/2012   w/LOA (07/23/2012)   APPENDECTOMY  1982   COLON RESECTION  02/27/2012   Procedure: COLON RESECTION;  Surgeon: Merrie Roof, MD;  Location: [place];  Service:  General;  Laterality: N/A;   COLON RESECTION N/A 10/04/2022   Procedure: SEGMENTAL COLON RESECTION;  Surgeon: Jovita Kussmaul, MD;  Location: [place];  Service: General;  Laterality: N/A;   COLONOSCOPY  10/13/2022   Procedure: RESECTION OF COLON ANASTOMOSIS AND END COLOSCOPY;  Surgeon: Kieth Brightly Arta Bruce, MD;  Location: [place];  Service: General;;   COLOSTOMY  02/27/2012   Procedure: COLOSTOMY;  Surgeon: Merrie Roof, MD;  Location: [place];  Service: General;  Laterality: N/A;   COLOSTOMY TAKEDOWN  07/23/2012   COLOSTOMY TAKEDOWN  07/23/2012   Procedure: COLOSTOMY TAKEDOWN;  Surgeon: Merrie Roof, MD;  Location: [place];  Service: General;  Laterality: N/A;  Primary Anastomosis   ELBOW FRACTURE SURGERY  ~ 1975   left;  pins inserted    EXCISION OF MESH N/A 10/04/2022   Procedure: REMOVAL OF MESH;  Surgeon: Jovita Kussmaul, MD;  Location: [facility];  Service: General;  Laterality: N/A;   INCISION AND DRAINAGE ABSCESS N/A 04/05/2021   Procedure: INCISION AND DRAINAGE ABDOMINAL WALL ABSCESS;  Surgeon: Jovita Kussmaul, MD;  Location: [place];  Service: General;  Laterality: N/A;    INSERTION OF MESH N/A 05/20/2013   Procedure: INSERTION OF MESH;  Surgeon: Merrie Roof, MD;  Location: [place];  Service: General;  Laterality: N/A;   IRRIGATION AND DEBRIDEMENT ABSCESS N/A 10/04/2022   Procedure: DRAINAGE ABDOMIAL WALL ABSCESS;  Surgeon: Jovita Kussmaul, MD;  Location: [place];  Service: General;  Laterality: N/A;   LAPAROSCOPIC LEFT COLON RESECTION  02/19/2012   SIGMOID   LAPAROTOMY  02/27/2012   Procedure: EXPLORATORY LAPAROTOMY;  Surgeon: Merrie Roof, MD;  Location: [place];  Service: General;  Laterality: N/A;   LAPAROTOMY  07/23/2012   Procedure: EXPLORATORY LAPAROTOMY;  Surgeon: Merrie Roof, MD;  Location: [place];  Service: General;  Laterality: N/A;   LAPAROTOMY N/A 10/04/2022   Procedure: EXPLORATORY LAPAROTOMY;  Surgeon: Jovita Kussmaul, MD;  Location: [place];  Service: General;  Laterality: N/A;   LAPAROTOMY N/A 10/13/2022   Procedure: EXPLORATORY LAPAROTOMY;  Surgeon: Mickeal Skinner, MD;  Location: [place];  Service: General;  Laterality: N/A;   LESION DESTRUCTION  07/23/2012   Procedure: DESTRUCTION LESION ANUS;  Surgeon: Merrie Roof, MD;  Location: [place];  Service: General;  Laterality: N/A;  DESTRUCTION ANAL CONDYLOMA   LYSIS OF ADHESION  07/23/2012   Procedure: LYSIS OF ADHESION;  Surgeon: Merrie Roof, MD;  Location: [place];  Service: General;  Laterality: N/A;   LYSIS OF ADHESION N/A 10/04/2022   Procedure: LYSIS OF ADHESION;  Surgeon: Jovita Kussmaul, MD;  Location: [place];  Service: General;  Laterality: N/A;   VENTRAL HERNIA REPAIR  05/20/2013   Dr Marlon Pel HERNIA REPAIR N/A 05/20/2013   Procedure: VENTRAL HERNIA REPAIR ;  Surgeon: Merrie Roof, MD;  Location: [place];  Service: General;  Laterality: N/A;   WART FULGURATION  02/19/2012   Procedure: FULGURATION ANAL WART;  Surgeon: Merrie Roof, MD;  Location: [place];  Service: General;  Laterality: N/A;  Destroy  Anal Condyloma    WISDOM TOOTH EXTRACTION  ~ [place] N/A 06/03/2016    Procedure: ABDOMINAL WOUND EXPLORATION AND STITCH REMOVAL;  Surgeon: [name], MD;  Location: [place];  Service: General;  Laterality: N/A;  ABDOMINAL WOUND EXPLORATION AND STITCH REMOVAL  Social History:   reports that he has been smoking cigarettes. He started smoking about 41 years ago. He has a 68.00 pack-year smoking history. He quit smokeless tobacco use about 11 years ago.  His smokeless tobacco use included chew. He reports that he does not currently use alcohol. He reports current drug use. Drugs: Oxycodone and Marijuana.   Family History:  His family history includes Alcohol abuse in his father; Anxiety disorder in his father; Breast cancer in his maternal aunt; Colon polyps in his father; Depression in his mother; Diabetes in his maternal grandmother and mother; Hypertension in his father; Parkinsonism in his mother; Ulcers in his father. There is no history of Stroke, Heart disease, Colon cancer, or Stomach cancer.   Allergies Allergies  Allergen Reactions   Ambien [Zolpidem Tartrate] Other (See Comments)    Caused agitation of mood.    Bactroban [Mupirocin Calcium] Swelling    Caused infection at a surgical site   Darvocet [Propoxyphene N-Acetaminophen] Hives and Itching   Zestril [Lisinopril] Cough   Morphine And Related Itching     Home Medications  Prior to Admission medications   Medication Sig Start Date End Date Taking? Authorizing Provider  albuterol (PROVENTIL) (2.5 MG/3ML) 0.083% nebulizer solution Take 3 mLs (2.5 mg total) by nebulization every 6 (six) hours as needed for wheezing or shortness of breath. 05/19/20  Yes Matilde Haymaker, MD  albuterol (VENTOLIN HFA) 108 (90 Base) MCG/ACT inhaler Inhale 2 puffs into the lungs every 6 (six) hours as needed for wheezing or shortness of breath. 06/24/22  Yes Gerrit Heck, MD  busPIRone (BUSPAR) 15 MG tablet TAKE 1 TABLET BY MOUTH IN THE MORNING. 09/23/22  Yes Dameron, Luna Fuse, DO  cyclobenzaprine  (FLEXERIL) 10 MG tablet TAKE 1 TABLET BY MOUTH DAILY AS NEEDED FOR MUSCLE SPASM. NEED OFFICE VISIT FOR REFILLS 10/01/22  Yes Gerrit Heck, MD  gabapentin (NEURONTIN) 100 MG capsule TAKE 1 CAPSULE BY MOUTH FOUR TIMES DAILY AS NEEDED Patient taking differently: Take 100 mg by mouth 4 (four) times daily as needed (Neuropathic pain). 06/24/22  Yes Gerrit Heck, MD  losartan (COZAAR) 100 MG tablet TAKE 1 TABLET (100 MG TOTAL) BY MOUTH DAILY. 09/23/22  Yes Dameron, Luna Fuse, DO  pantoprazole (PROTONIX) 40 MG tablet TAKE 1 TABLET BY MOUTH DAILY 09/23/22  Yes Dameron, Luna Fuse, DO  sertraline (ZOLOFT) 100 MG tablet TAKE 1 TABLET (100 MG TOTAL) BY MOUTH DAILY. 08/29/22  Yes Gerrit Heck, MD  benzonatate (TESSALON PERLES) 100 MG capsule Take 1 capsule (100 mg total) by mouth 3 (three) times daily as needed for cough. Patient not taking: Reported on 10/01/2022 09/02/22   Gerrit Heck, MD  ondansetron (ZOFRAN-ODT) 4 MG disintegrating tablet Take 1 tablet (4 mg total) by mouth every 8 (eight) hours as needed for nausea or vomiting. 06/24/22   Gerrit Heck, MD  umeclidinium-vilanterol (ANORO ELLIPTA) 62.5-25 MCG/ACT AEPB Inhale 1 puff into the lungs daily. Patient not taking: Reported on 10/01/2022 09/02/22   Gerrit Heck, MD  varenicline (CHANTIX) 0.5 MG tablet Take 1 tablet (0.5 mg total) by mouth 2 (two) times daily with a meal. Once daily for the first week then twice daily Patient not taking: Reported on 08/08/2022 07/01/22   Zenia Resides, MD  lurasidone (LATUDA) 40 MG TABS tablet TAKE 1 TABLET(40 MG) BY MOUTH DAILY WITH BREAKFAST 05/12/19 05/08/20  Nuala Alpha, MD  mometasone-formoterol (DULERA) 100-5 MCG/ACT AERO Inhale 2 puffs into the lungs 2 (two) times daily. 02/22/19 05/19/20  Nuala Alpha, MD  Critical care time: 35 minutes

## 2022-10-14 NOTE — Progress Notes (Signed)
At bedside for PICC placement. Pt refusing placement at this time, citing he "did not feel like it tonight." Educated pt on the need for TPN and the requirements of infusing only through a central line. Pt continued refusing placement at this time. Primary RN aware. Will revisit 10/15/22.

## 2022-10-14 NOTE — Progress Notes (Signed)
[facility] Progress Note Patient Name: Timothy [name] DOB: January 02, 1966 MRN: [id number]   Date of Service  10/14/2022  HPI/Events of Note  Notified of hypotension with BP in the 70s-90s.  Pt dropping BP with propofol or with boluses of fentanyl.  Pt given 500cc LR bolus with minimal effect.   eICU Interventions  Start on levophed peripherally.  Bedside to address need for central line if with continued need for pressors.     Intervention Category Intermediate Interventions: Hypotension - evaluation and management  Elsie [place] 10/14/2022, 3:42 AM

## 2022-10-14 NOTE — Progress Notes (Signed)
PHARMACY - TOTAL PARENTERAL NUTRITION CONSULT NOTE   Indication: Prolonged ileus  Patient Measurements: Height: 5\' 9"  (175.3 cm) Weight: 72.6 kg (160 lb) IBW/kg (Calculated) : 70.7 TPN AdjBW (KG): 72.6 Body mass index is 23.63 kg/m.  Assessment: 76 yom with hx ventral hernia repair with mesh (~9 years PTA) presenting 3/15 with abdominal wall abscess. S/p ex-lap drainage of abdominal wall abscess, removal of mesh, segmental colon resection, and LOA on 3/15. Then taken emergently for ex-lap 3/24 and found to have anastomotic leak with intra-abdominal abscess, s/p resection of colon anastomosis. Patient extubated 3/25 post-op. Per RD note, Surgery recommending bowel rest. Patient was NPO x 6 days PTA, then advanced to FLD x 2 days and soft diet x 1 day. NPO again 3/24. Pharmacy consulted to start TPN.  Glucose / Insulin: no hx DM (no recent A1c on file). CBGs <180 on no meds Noted s/p Decadron 10mg  IV x 1 on 3/24 Electrolytes: Na 134, K 3.8>>5 (s/p KCl total 1mEq overnight 3/25), corrected Ca ~9.9 (s/p calcium gluconate 1g IV x 1 on 3/25), last Mag 1.7 (3/23), no Phos available yet. Others WNL Renal: AKI - SCr up to 1.42 (0.43 on 3/20) Hepatic: LFTs / Tbili WNL. No TG available yet. Albumin 1.6 Intake / Output; MIVF: MIVF: LR at 125 ml/hr; LBM 3/21 GI Imaging: GI Surgeries / Procedures:   Central access: PICC placement planned 10/14/22 TPN start date: 10/15/22  Nutritional Goals: TBD - RD recommendations to be updated post-extubation  RD Assessment: Estimated Needs Total Energy Estimated Needs: 1600-1815 kcals Total Protein Estimated Needs: 100-145 gm Total Fluid Estimated Needs: >/= 1.6 L  Current Nutrition:  NPO  Plan:  TPN to start 3/26 due to timing of order entry per Pharmacy protocol TPN labs and RD consult entered Continue LR at 125 ml/hr for now Watch SCr trend and electrolytes closely (vancomycin >> linezolid today per discussion with Dr. Marlou Starks)   Arturo Morton,  PharmD, BCPS Please check AMION for all [facility] contact numbers Clinical Pharmacist 10/14/2022 2:08 PM

## 2022-10-15 DIAGNOSIS — L02211 Cutaneous abscess of abdominal wall: Secondary | ICD-10-CM | POA: Diagnosis not present

## 2022-10-15 LAB — TRIGLYCERIDES: Triglycerides: 74 mg/dL (ref <150)

## 2022-10-15 LAB — GLUCOSE, CAPILLARY
Glucose-Capillary: 101 mg/dL — ABNORMAL HIGH (ref 70–99)
Glucose-Capillary: 131 mg/dL — ABNORMAL HIGH (ref 70–99)
Glucose-Capillary: 86 mg/dL (ref 70–99)
Glucose-Capillary: 88 mg/dL (ref 70–99)
Glucose-Capillary: 95 mg/dL (ref 70–99)
Glucose-Capillary: 99 mg/dL (ref 70–99)

## 2022-10-15 LAB — COMPREHENSIVE METABOLIC PANEL
ALT: 13 U/L (ref 0–44)
AST: 18 U/L (ref 15–41)
Albumin: 1.5 g/dL — ABNORMAL LOW (ref 3.5–5.0)
Alkaline Phosphatase: 67 U/L (ref 38–126)
Anion gap: 9 (ref 5–15)
BUN: 11 mg/dL (ref 6–20)
CO2: 26 mmol/L (ref 22–32)
Calcium: 7.7 mg/dL — ABNORMAL LOW (ref 8.9–10.3)
Chloride: 101 mmol/L (ref 98–111)
Creatinine, Ser: 1.26 mg/dL — ABNORMAL HIGH (ref 0.61–1.24)
GFR, Estimated: >60 mL/min (ref >60)
Glucose, Bld: 94 mg/dL (ref 70–99)
Potassium: 4.1 mmol/L (ref 3.5–5.1)
Sodium: 136 mmol/L (ref 135–145)
Total Bilirubin: 0.7 mg/dL (ref 0.3–1.2)
Total Protein: 4.5 g/dL — ABNORMAL LOW (ref 6.5–8.1)

## 2022-10-15 LAB — MAGNESIUM
Magnesium: 1.3 mg/dL — ABNORMAL LOW (ref 1.7–2.4)
Magnesium: 2.1 mg/dL (ref 1.7–2.4)

## 2022-10-15 LAB — SURGICAL PATHOLOGY

## 2022-10-15 LAB — PHOSPHORUS: Phosphorus: 4.5 mg/dL (ref 2.5–4.6)

## 2022-10-15 MED ORDER — ALUM & MAG HYDROXIDE-SIMETH 200-200-20 MG/5ML PO SUSP: 30.0000 mL | ORAL | Status: DC | PRN

## 2022-10-15 MED ORDER — BOOST / RESOURCE BREEZE PO LIQD CUSTOM
1.0000 | Freq: Three times a day (TID) | ORAL | Status: DC
  Administered 2022-10-15 – 2022-10-16 (×4): 1 via ORAL

## 2022-10-15 MED ORDER — MAGNESIUM SULFATE 4 GM/100ML IV SOLN
4.0000 g | Freq: Once | INTRAVENOUS | Status: AC
  Administered 2022-10-15: 4 g via INTRAVENOUS
  Filled 2022-10-15: qty 100

## 2022-10-15 MED ORDER — METHOCARBAMOL 500 MG PO TABS
500.0000 mg | ORAL_TABLET | Freq: Four times a day (QID) | ORAL | Status: DC
  Administered 2022-10-15 – 2022-10-19 (×16): 500 mg via ORAL
  Filled 2022-10-15 (×17): qty 1

## 2022-10-15 MED ORDER — MAGNESIUM SULFATE 2 GM/50ML IV SOLN
2.0000 g | Freq: Once | INTRAVENOUS | Status: AC
  Administered 2022-10-15: 2 g via INTRAVENOUS
  Filled 2022-10-15: qty 50

## 2022-10-15 MED ORDER — IPRATROPIUM-ALBUTEROL 0.5-2.5 (3) MG/3ML IN SOLN
3.0000 mL | RESPIRATORY_TRACT | Status: DC | PRN
  Administered 2022-10-16: 3 mL via RESPIRATORY_TRACT
  Filled 2022-10-15: qty 3

## 2022-10-15 MED ORDER — ONDANSETRON HCL 4 MG PO TABS: 4.0000 mg | ORAL_TABLET | Freq: Four times a day (QID) | ORAL | Status: DC | PRN

## 2022-10-15 MED ORDER — TRAMADOL HCL 50 MG PO TABS
50.0000 mg | ORAL_TABLET | Freq: Four times a day (QID) | ORAL | Status: DC | PRN
  Administered 2022-10-15 – 2022-10-19 (×5): 50 mg via ORAL
  Filled 2022-10-15 (×5): qty 1

## 2022-10-15 NOTE — Evaluation (Signed)
Physical Therapy Evaluation Patient Details Name: Timothy [name] MRN: [id number] DOB: 1966/03/03 Today's Date: 10/15/2022  History of Present Illness  57 y/o male admitted for abdominal wall abscess. Around 9 yrs s/p ventral hernia repair with mesh. Now post-op exploratory laparotomy and drainage abdominal wall abscess 10/04/22 and repeat exploratory laparotomy with resection of colon 10/13/22.  Clinical Impression  Patient presents with decreased mobility due to post surgical abdominal pain and deconditioning from prolonged bedrest/hospitalization and 2 surgeries.  Patient previously independent living in one level home, on disability and son living with him.  Currently needing minguard assist for mobility with multiple lines.  Patient likely to progress to not need follow up PT at d/c.  PT will continue to follow in acute setting.      Recommendations for follow up therapy are one component of a multi-disciplinary discharge planning process, led by the attending physician.  Recommendations may be updated based on patient status, additional functional criteria and insurance authorization.  Follow Up Recommendations       Assistance Recommended at Discharge PRN  Patient can return home with the following  A little help with walking and/or transfers;A little help with bathing/dressing/bathroom;Help with stairs or ramp for entrance;Assist for transportation    Equipment Recommendations Rolling walker (2 wheels)  Recommendations for Other Services       Functional Status Assessment Patient has had a recent decline in their functional status and demonstrates the ability to make significant improvements in function in a reasonable and predictable amount of time.     Precautions / Restrictions Precautions Precautions: Fall Precaution Comments: PCA, colostomy, midline JP drain Restrictions Weight Bearing Restrictions: No      Mobility  Bed Mobility Overal bed mobility: Needs  Assistance Bed Mobility: Rolling, Sidelying to Sit Rolling: Supervision Sidelying to sit: Min assist       General bed mobility comments: assist to sit with cues for technique due to abdominal surgery    Transfers Overall transfer level: Needs assistance Equipment used: Rolling walker (2 wheels) Transfers: Sit to/from Stand Sit to Stand: Min guard           General transfer comment: assist for lines    Ambulation/Gait Ambulation/Gait assistance: Min guard, +2 safety/equipment Gait Distance (Feet): 350 Feet Assistive device: Rolling walker (2 wheels) Gait Pattern/deviations: Step-through pattern, Trunk flexed, Decreased stride length       General Gait Details: using RW with some trunk flexion with abdominal tightness minguard for balance and safety and +2 for IV pole with O2, telemetry and PCA  Stairs            Wheelchair Mobility    Modified Rankin (Stroke Patients Only)       Balance Overall balance assessment: Needs assistance   Sitting balance-Leahy Scale: Good     Standing balance support: During functional activity Standing balance-Leahy Scale: Fair Standing balance comment: can stand without UE support during transitions, RW for ambulation with flexed posture                             Pertinent Vitals/Pain Pain Assessment Pain Assessment: 0-10 Pain Score: 5  Pain Location: abdomen Pain Descriptors / Indicators: Aching, Discomfort Pain Intervention(s): Repositioned    Home Living Family/patient expects to be discharged to:: Private residence Living Arrangements: Children (son lives with him)   Type of Home: House Home Access: Stairs to enter Entrance Stairs-Rails: Right Entrance Stairs-Number of Steps: 3   Home Layout:  One level Home Equipment: None      Prior Function Prior Level of Function : Independent/Modified Independent             Mobility Comments: on disability       Hand Dominance   Dominant  Hand: Right    Extremity/Trunk Assessment   Upper Extremity Assessment Upper Extremity Assessment: Defer to OT evaluation    Lower Extremity Assessment Lower Extremity Assessment: Overall WFL for tasks assessed    Cervical / Trunk Assessment Cervical / Trunk Assessment: Normal  Communication   Communication: No difficulties  Cognition Arousal/Alertness: Awake/alert Behavior During Therapy: WFL for tasks assessed/performed Overall Cognitive Status: Within Functional Limits for tasks assessed                                          General Comments General comments (skin integrity, edema, etc.): VSS on 4L O2    Exercises     Assessment/Plan    PT Assessment Patient needs continued PT services  PT Problem List Decreased strength;Decreased activity tolerance;Decreased balance;Decreased mobility;Decreased knowledge of use of DME;Pain       PT Treatment Interventions DME instruction;Gait training;Stair training;Functional mobility training;Therapeutic activities;Balance training    PT Goals (Current goals can be found in the Care Plan section)  Acute Rehab PT Goals Patient Stated Goal: to return to independent PT Goal Formulation: With patient Time For Goal Achievement: 10/29/22 Potential to Achieve Goals: Good    Frequency Min 3X/week     Co-evaluation PT/OT/SLP Co-Evaluation/Treatment: Yes Reason for Co-Treatment: For patient/therapist safety;To address functional/ADL transfers PT goals addressed during session: Mobility/safety with mobility;Balance OT goals addressed during session: Strengthening/ROM;Proper use of Adaptive equipment and DME;ADL's and self-care       AM-PAC PT "6 Clicks" Mobility  Outcome Measure Help needed turning from your back to your side while in a flat bed without using bedrails?: A Little Help needed moving from lying on your back to sitting on the side of a flat bed without using bedrails?: A Little Help needed moving  to and from a bed to a chair (including a wheelchair)?: A Little Help needed standing up from a chair using your arms (e.g., wheelchair or bedside chair)?: A Little Help needed to walk in hospital room?: A Little Help needed climbing 3-5 steps with a railing? : Total 6 Click Score: 16    End of Session Equipment Utilized During Treatment: Oxygen Activity Tolerance: Patient tolerated treatment well Patient left: in chair;with chair alarm set;with call bell/phone within reach Nurse Communication: Mobility status PT Visit Diagnosis: Difficulty in walking, not elsewhere classified (R26.2);Pain Pain - part of body:  (abdomen)    Time: [id number] PT Time Calculation (min) (ACUTE ONLY): 31 min   Charges:   PT Evaluation $PT Eval Moderate Complexity: 1 Mod          Cyndi Declynn Lopresti, PT Acute Rehabilitation Services Office:[phone] 10/15/2022   Reginia Naas 10/15/2022, 10:32 AM

## 2022-10-15 NOTE — Progress Notes (Addendum)
PHARMACY - TOTAL PARENTERAL NUTRITION CONSULT NOTE   Indication: Prolonged ileus  Patient Measurements: Height: 5\' 9"  (175.3 cm) Weight: 72.6 kg (160 lb) IBW/kg (Calculated) : 70.7 TPN AdjBW (KG): 72.6 Body mass index is 23.63 kg/m.  Assessment: 64 yom with hx ventral hernia repair with mesh (~9 years PTA) presenting 3/15 with abdominal wall abscess. S/p ex-lap drainage of abdominal wall abscess, removal of mesh, segmental colon resection, and LOA on 3/15. Then taken emergently for ex-lap 3/24 and found to have anastomotic leak with intra-abdominal abscess, s/p resection of colon anastomosis. Patient extubated 3/25 post-op. Per RD note, Surgery recommending bowel rest. Patient was NPO x 6 days PTA, then advanced to FLD x 2 days and soft diet x 1 day. NPO again 3/24. Pharmacy consulted to start TPN.  Glucose / Insulin: no hx DM (no recent A1c on file). CBGs <180 on no meds Noted s/p Decadron 10mg  IV x 1 on 3/24 Electrolytes: K 5>4.1 (s/p KCl total 62mEq overnight 3/25), corrected Ca ~9.7 (s/p calcium gluconate 1g IV x 1 on 3/25), Mag 1.3; other electrolytes WNL Renal: AKI - SCr back down to 1.26 (0.43 on 3/20), BUN WNL Hepatic: LFTs / Tbili / TG WNL. Albumin 1.5 Intake / Output; MIVF: drain output 130 ml/24hrs, ostomy output 60 ml/24hrs; MIVF: LR at 125 ml/hr; LBM 3/21 GI Imaging: 3/24 CT abd/pelvis - considerable amount of free intraperitoneal air related to breakdown of colonic anastomosis in LLQ, 2 large air-filled fluid collections consistent with post-op abscess GI Surgeries / Procedures: 3/15 ex-lap drainage of abdominal wall abscess, removal of mesh, segmental colon resection, and LOA 3/24 emergent ex-lap, s/p resection of colon anastomosis  Central access: PICC placement planned 10/14/22 - patient refused TPN start date: planned 10/15/22  RD Assessment: Estimated Needs Total Energy Estimated Needs: 2150-2550 kcals Total Protein Estimated Needs: 100-145 gm Total Fluid Estimated  Needs: >/= 2.1 L  Current Nutrition:  CLD started 3/26 per Surgery  Plan:  Per discussion with Dr. Marlou Starks, hold off on PICC/TPN today with patient improving and ostomy starting to work. General Surgery team to re-consult for TPN as needed TPN consult/PICC, labs, and nursing orders d/c'd F/u PO intake, diet advancement as able   Arturo Morton, PharmD, BCPS Please check AMION for all [place] contact numbers Clinical Pharmacist 10/15/2022 7:40 AM

## 2022-10-15 NOTE — Progress Notes (Signed)
2 Days Post-Op   Subjective/Chief Complaint: No complaints. Passing lots of flatus in bag   Objective: Vital signs in last 24 hours: Temp:  [97.6 F (36.4 C)-98.3 F (36.8 C)] 97.6 F (36.4 C) (03/26 0400) Pulse Rate:  [64-117] 90 (03/26 0800) Resp:  [13-25] 14 (03/26 0800) BP: (86-118)/(52-84) 104/67 (03/26 0800) SpO2:  [77 %-100 %] 99 % (03/26 0800) FiO2 (%):  [21 %-28 %] 21 % (03/26 0001) Last BM Date : 10/10/22  Intake/Output from previous day: 03/25 0701 - 03/26 0700 In: 4398.4 [I.V.:3093.3; IV Piggyback:1305.1] Out: 1865 [Urine:1675; Drains:110; Stool:80] Intake/Output this shift: Total I/O In: 403.1 [I.V.:253.1; IV Piggyback:150] Out: -   General appearance: alert and cooperative Resp: clear to auscultation bilaterally Cardio: regular rate and rhythm GI: soft, mild tenderness. Ostomy pink with air in bag  Lab Results:  Recent Labs    10/13/22 1829 10/13/22 2250 10/14/22 0102 10/14/22 0752  WBC 23.1*  --   --  17.7*  HGB 11.1*   < > 8.8* 8.8*  HCT 34.5*   < > 26.0* 27.8*  PLT 694*  --   --  571*   < > = values in this interval not displayed.   BMET Recent Labs    10/14/22 0752 10/15/22 0357  NA 134* 136  K 5.0 4.1  CL 101 101  CO2 25 26  GLUCOSE 137* 94  BUN 9 11  CREATININE 1.42* 1.26*  CALCIUM 8.0* 7.7*   PT/INR Recent Labs    10/14/22 0752  LABPROT 15.8*  INR 1.3*   ABG Recent Labs    10/13/22 2250 10/14/22 0102  PHART 7.364 7.308*  HCO3 27.8 22.1    Studies/Results: [place] EKG SITE RITE  Result Date: 10/14/2022 If Site Rite image not attached, placement could not be confirmed due to current cardiac rhythm.  DG Abd Portable 1V  Result Date: 10/14/2022 CLINICAL DATA:  Orogastric tube placement. EXAM: PORTABLE ABDOMEN - 1 VIEW COMPARISON:  Same day. FINDINGS: Distal tip of nasogastric tube is seen in expected position of distal esophagus; it appears to be somewhat retracted compared to prior exam. IMPRESSION: Distal tip of  nasogastric tube seen in expected position of distal esophagus and appears to have been somewhat retracted compared to prior exam. Electronically Signed   By: Marijo Conception M.D.   On: 10/14/2022 09:37   DG Abd Portable 1V  Result Date: 10/14/2022 CLINICAL DATA:  Evaluate NG tube placement. EXAM: PORTABLE ABDOMEN - 1 VIEW COMPARISON:  10/14/2022 FINDINGS: Enteric tube is tip and side port are below the GE junction within the gastric fundus. Pneumoperitoneum is again noted along the undersurface of the right hemidiaphragm unchanged gaseous distension of the transverse colon. IMPRESSION: 1. Satisfactory position of enteric tube. 2. Unchanged gaseous distension of the transverse colon. 3. Persistent pneumoperitoneum which most likely reflects changes from recent laparotomy. Electronically Signed   By: Kerby Moors M.D.   On: 10/14/2022 08:08   DG Abd Portable 1V  Result Date: 10/14/2022 CLINICAL DATA:  OG tube placement. EXAM: PORTABLE ABDOMEN - 1 VIEW COMPARISON:  Earlier same day FINDINGS: OG tube tip is in the distal esophagus. Tube could be advanced 16-20 cm to place the proximal side port below the GE junction, as clinically warranted. Gaseous distention of transverse colon noted upper abdomen. IMPRESSION: OG tube tip is in the distal esophagus.  See above. Electronically Signed   By: Misty Stanley M.D.   On: 10/14/2022 08:02   CT ABDOMEN PELVIS W CONTRAST  Result Date: 10/14/2022 CLINICAL DATA:  Vomiting EXAM: CT ABDOMEN AND PELVIS WITH CONTRAST TECHNIQUE: Multidetector CT imaging of the abdomen and pelvis was performed using the standard protocol following bolus administration of intravenous contrast. RADIATION DOSE REDUCTION: This exam was performed according to the departmental dose-optimization program which includes automated exposure control, adjustment of the mA and/or kV according to patient size and/or use of iterative reconstruction technique. CONTRAST:  50mL ISOVUE-370 IOPAMIDOL  (ISOVUE-370) INJECTION 76% COMPARISON:  08/14/2022 FINDINGS: Lower chest: Patchy atelectatic changes are noted in the bases bilaterally. Hepatobiliary: No focal liver abnormality is seen. No gallstones, gallbladder wall thickening, or biliary dilatation. Pancreas: Unremarkable. No pancreatic ductal dilatation or surrounding inflammatory changes. Spleen: Normal in size without focal abnormality. Adrenals/Urinary Tract: Adrenal glands are within normal limits. Kidneys demonstrate a normal enhancement pattern bilaterally. No renal calculi or obstructive changes are seen. The bladder is decompressed. Stomach/Bowel: Postsurgical changes are noted is at the junction of the descending and sigmoid colons with a defect at the anastomotic site and adjacent free air. There is extensive intraperitoneal air identified consistent with this disruption. The more proximal colon appears within normal limits. The appendix is not visualized consistent with a prior surgical history. Stomach is well distended with fluid. Some mildly dilated loops of small bowel are not identified likely reactive to a large intra-abdominal air-fluid collection in the left mid abdomen. This measures approximately 12.6 x 8.5 cm consistent with postoperative abscess. This is best seen on image number 61 of series 3. Adjacent to this is a smaller air-fluid collection measuring 7.4 x 3.6 cm. These 2 air-fluid collections may inter connect although it is difficult to demonstrate on this exam. Vascular/Lymphatic: Aortic atherosclerosis. No enlarged abdominal or pelvic lymph nodes. Reproductive: Prostate is unremarkable. Other: No focal hernia is noted. Previously seen subcutaneous abscess has resolved following prior surgery. Open anterior wound is noted with packing material within. Musculoskeletal: No acute or significant osseous findings. IMPRESSION: Considerable amount of free intraperitoneal air related to breakdown of colonic anastomosis in the left lower  quadrant best seen on image number 69 of series 3. Additionally there are 2 large air-fluid collections consistent with postoperative abscess. Associated small bowel dilatation is noted likely of a reactive nature. These findings were not communicated to the referring clinician at this time as the patient has already return to the operating room prior to the interpretation of this exam. Electronically Signed   By: Inez Catalina M.D.   On: 10/14/2022 02:23   DG Abd Portable 1V  Result Date: 10/14/2022 CLINICAL DATA:  Check gastric catheter placement EXAM: PORTABLE ABDOMEN - 1 VIEW COMPARISON:  None Available. FINDINGS: Gastric catheter is noted in the distal esophagus. This should be advanced several cm deeper into the stomach. IMPRESSION: Gastric catheter in the distal esophagus. This should be advanced deeper into the stomach. Electronically Signed   By: Inez Catalina M.D.   On: 10/14/2022 02:06   DG CHEST PORT 1 VIEW  Result Date: 10/14/2022 CLINICAL DATA:  Check endotracheal tube and gastric catheter placement, initial encounter EXAM: PORTABLE CHEST 1 VIEW COMPARISON:  10/11/2022 FINDINGS: Cardiac shadow is stable. Endotracheal tube is noted 5 cm above the carina. Gastric catheter is seen in the distal esophagus well short of the stomach. This should be advanced several cm. The lungs are clear. Free air is noted beneath the right hemidiaphragm stable from the prior exam. IMPRESSION: Tubes and lines as described. Gastric catheter should be advanced deeper into the stomach. Free intraperitoneal air beneath  the right hemidiaphragm similar to that seen on the prior exam. Electronically Signed   By: Inez Catalina M.D.   On: 10/14/2022 02:06    Anti-infectives: Anti-infectives (From admission, onward)    Start     Dose/Rate Route Frequency Ordered Stop   10/14/22 1445  linezolid (ZYVOX) IVPB 600 mg        600 mg 300 mL/hr over 60 Minutes Intravenous Every 12 hours 10/14/22 1354     10/14/22 0927   vancomycin variable dose per unstable renal function (pharmacist dosing)  Status:  Discontinued         Does not apply See admin instructions 10/14/22 0928 10/14/22 1354   10/10/22 0800  piperacillin-tazobactam (ZOSYN) IVPB 3.375 g        3.375 g 12.5 mL/hr over 240 Minutes Intravenous Every 8 hours 10/10/22 0728 10/15/22 0759   10/09/22 0045  vancomycin (VANCOCIN) IVPB 1000 mg/200 mL premix  Status:  Discontinued        1,000 mg 200 mL/hr over 60 Minutes Intravenous Every 12 hours 10/08/22 1158 10/14/22 0928   10/08/22 1245  vancomycin (VANCOREADY) IVPB 1250 mg/250 mL        1,250 mg 166.7 mL/hr over 90 Minutes Intravenous  Once 10/08/22 1158 10/08/22 1639   10/04/22 1700  piperacillin-tazobactam (ZOSYN) IVPB 3.375 g        3.375 g 12.5 mL/hr over 240 Minutes Intravenous Every 8 hours 10/04/22 1613 10/09/22 1416   10/04/22 0615  ceFAZolin (ANCEF) IVPB 2g/100 mL premix        2 g 200 mL/hr over 30 Minutes Intravenous On call to O.R. 10/04/22 0601 10/04/22 1131       Assessment/Plan: s/p Procedure(s): EXPLORATORY LAPAROTOMY (N/A) RESECTION OF COLON ANASTOMOSIS AND END COLOSCOPY Advance diet. Allow clears today Consider holding off on picc and tpn since ostomy is starting to work Continue zosyn and linezolid Continue dressing changes. AKI. Follow cr Appreciate ccm assistance  LOS: 11 days    [name] 10/15/2022

## 2022-10-15 NOTE — Progress Notes (Signed)
[facility] ADULT ICU REPLACEMENT PROTOCOL   The patient does apply for the [facility] Adult ICU Electrolyte Replacment Protocol based on the criteria listed below:   1.Exclusion criteria: TCTS, ECMO, Dialysis, and Myasthenia Gravis patients 2. Is GFR >/= 30 ml/min? Yes.    Patient's GFR today is >60 3. Is SCr </= 2? Yes.   Patient's SCr is 1.26 mg/dL 4. Did SCr increase >/= 0.5 in 24 hours? No. 5.Pt's weight >40kg  Yes.   6. Abnormal electrolyte(s): Mag 1.3  7. Electrolytes replaced per protocol 8.  Call MD STAT for K+ </= 2.5, Phos </= 1, or Mag </= 1 Physician:  Dr. Willaim Bane, [place] Grumbling 10/15/2022 5:54 AM

## 2022-10-15 NOTE — Progress Notes (Signed)
NAME:  Timothy [name], MRN:  [id number], DOB:  October 03, 1965, LOS: 11 ADMISSION DATE:  10/04/2022, CONSULTATION DATE:  10/15/22  REFERRING MD:  Dr Ninfa Linden, CHIEF COMPLAINT:  Abdominal Abscess   Critical care team will sign off at this time. Please consult again for any concern.  History of Present Illness:  57 yo man with hx of Ex lap pod #11, abdominal wall abscess, removal of mesh, segmental resection of colon, LOA. Now pod #2 from ex lap, colon anastomosis resection after CT abdomen 3/24 showed free air and intraabdominal abscess.     In OR 3/24 late evening: found to have anastamotic leak with intraabdominal abscess. Had resection of colon anastamosis.    On dilaudid PCA for pain control Currently on Linezolid, Zosyn     Pertinent  Medical History  Hypertension Emphysema Tobacco Use Alcohol Use (6 beers per day)  Significant Hospital Events: Including procedures, antibiotic start and stop dates in addition to other pertinent events   3/15 ex lap for drainage of abdominal wall abscess, segmental colon resection 3/15 Zosyn started 3/19 Vancomycin started 3/24 emergent ex lap after CT showed free air, intraabdominal abscess  3/25 patient extubated 3/26 critical care team signed off  Interim History / Subjective:  Yesterday afternoon, patient refused PICC line for enteral nutrition. This morning, surgery team states that they will try to advance the patient to clear liquids and hold off on the PICC line for now. Overnight, patient reports sleeping well. He states that his pain is well controlled, and he has had no nausea or vomiting. Nursing staff states patient did well overnight.  Objective   Blood pressure 118/79, pulse (!) 105, temperature 98 F (36.7 C), temperature source Axillary, resp. rate (!) 22, height 5\' 9"  (1.753 m), weight 72.6 kg, SpO2 95 %.    FiO2 (%):  [21 %-28 %] 21 %   Intake/Output Summary (Last 24 hours) at 10/15/2022 1040 Last data filed at 10/15/2022  1000 Gross per 24 hour  Intake 4399.32 ml  Output 1865 ml  Net 2534.32 ml   Filed Weights   10/04/22 0609  Weight: 72.6 kg    Examination: Constitutional: Well-appearing patient in no acute distress.  HENT: Normocephalic and atraumatic, moist mucous membranes Cardiovascular: Normal rate, regular rhythm, S1 and S2 present, no murmurs, rubs, gallops.  Distal pulses intact. Respiratory: Lungs are clear to auscultation bilaterally. GI: Soft. Non-distended. No appreciable organomegaly. LUQ ostomy bag with bright red blood. Minimal stool present. Musculoskeletal: Normal bulk and tone.  No peripheral edema noted. Neurological: Awake and alert, oriented x4. No focal neuro deficits. Skin: Warm and dry.  No rash, erythema, lesions noted.  Resolved Hospital Problem list   Hypoxemia: Patient remained intubated for hypoxemia. Extubated yesterday and satting well on room air.   Hypotension: Chronically hypertensive patient with systolic pressures down to 70s early 3/25 morning. Likely secondary to propofol, fentanyl gtt. Patient now normotensive.  Assessment & Plan:  Intraabdominal Abscess s/p emergent ex lap 3/24  - Surgery following  - Linezolid/Zosyn  - Bright red blood in ostomy bag, continue to monitor CBC   Acute Kidney Injury: Patient's creatinine bumped from 0.55 to 1.41 yesterday. After fluids, his creatinine is 1.26 today. Suspect secondary to abdominal surgery. - Continue fluids - Continue to monitor CMP   Critical care team signing off at this time.  Best Practice (right click and "Reselect all SmartList Selections" daily)   Diet/type: clear liquids per surgery team DVT prophylaxis: LMWH GI prophylaxis: PPI Lines: N/A Foley:  N/A Code Status:  full code Last date of multidisciplinary goals of care discussion []   Labs   CBC: Recent Labs  Lab 10/09/22 0119 10/10/22 0323 10/13/22 1829 10/13/22 2250 10/14/22 0102 10/14/22 0752  WBC 10.6* 13.9* 23.1*  --   --  17.7*   NEUTROABS  --   --  19.7*  --   --  15.3*  HGB 9.7* 11.2* 11.1* 10.2* 8.8* 8.8*  HCT 31.1* 34.9* 34.5* 30.0* 26.0* 27.8*  MCV 101.3* 99.1 96.4  --   --  98.2  PLT 296 360 694*  --   --  571*    Basic Metabolic Panel: Recent Labs  Lab 10/10/22 0323 10/11/22 0353 10/12/22 0315 10/13/22 2250 10/14/22 0102 10/14/22 0752 10/15/22 0357  NA 139 137 138 133* 135 134* 136  K 3.2* 3.4* 4.1 3.8 3.3* 5.0 4.1  CL 102 102 104  --   --  101 101  CO2 26 25 28   --   --  25 26  GLUCOSE 105* 99 124*  --   --  137* 94  BUN <5* <5* <5*  --   --  9 11  CREATININE 0.53* 0.44* 0.55*  --   --  1.42* 1.26*  CALCIUM 8.8* 8.3* 8.1*  --   --  8.0* 7.7*  MG 1.3* 1.5* 1.7  --   --   --  1.3*  PHOS  --   --   --   --   --   --  4.5   GFR: Estimated Creatinine Clearance: 67 mL/min (A) (by C-G formula based on SCr of 1.26 mg/dL (H)). Recent Labs  Lab 10/09/22 0119 10/10/22 0323 10/13/22 1829 10/14/22 0752  WBC 10.6* 13.9* 23.1* 17.7*    Liver Function Tests: Recent Labs  Lab 10/14/22 0752 10/15/22 0357  AST 15 18  ALT 14 13  ALKPHOS 65 67  BILITOT 1.0 0.7  PROT 4.5* 4.5*  ALBUMIN 1.6* 1.5*   No results for input(s): "LIPASE", "AMYLASE" in the last 168 hours. No results for input(s): "AMMONIA" in the last 168 hours.  ABG    Component Value Date/Time   PHART 7.308 (L) 10/14/2022 0102   PCO2ART 44.1 10/14/2022 0102   PO2ART 343 (H) 10/14/2022 0102   HCO3 22.1 10/14/2022 0102   TCO2 23 10/14/2022 0102   ACIDBASEDEF 4.0 (H) 10/14/2022 0102   O2SAT 100 10/14/2022 0102     Coagulation Profile: Recent Labs  Lab 10/14/22 0752  INR 1.3*    Cardiac Enzymes: No results for input(s): "CKTOTAL", "CKMB", "CKMBINDEX", "TROPONINI" in the last 168 hours.  HbA1C: Hemoglobin A1C  Date/Time Value Ref Range Status  10/29/2011 10:15 AM 5.2  Final    CBG: Recent Labs  Lab 10/14/22 1520 10/14/22 1937 10/14/22 2335 10/15/22 0332 10/15/22 0759  GLUCAP 139* 108* 113* 101* 88     Review of Systems:   No headache No chest pain No shortness of breath No leg pain, leg swelling Mild abdominal pain, no nausea or vomiting No extremity pain  Past Medical History:  He,  has a past medical history of Acute bronchitis (02/15/2013), Alcohol abuse, Anxiety, Bipolar disorder ([place]), Bronchitis, Bronchitis, Cellulitis, Chronic pain, Complication of anesthesia, Condyloma (02/04/2012), Depression, Diverticulosis, Dyspnea, Elevated CK (09/2011), Emphysema lung ([place]), Emphysema of lung ([place]), Family history of anesthesia complication, GERD (gastroesophageal reflux disease), Glaucoma syndrome, Headache(784.0), Heavy smoker, Hemorrhoids, History of colostomy (03/21/2012), History of diverticulitis of colon (10/16/2011), History of dizziness, History of stomach ulcers (2005), HPV (human  papilloma virus) infection, Hypertension, Hypoglycemia, Incisional hernia, Lower GI bleed, Migraine headache (08/23/2012), Migraines, 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   Surgical History:   Past Surgical History:  Procedure Laterality Date   ABDOMINAL EXPLORATION SURGERY  07/23/2012   w/LOA (07/23/2012)   [place] RESECTION  02/27/2012   Procedure: COLON RESECTION;  Surgeon: Merrie Roof, MD;  Location: [place];  Service: General;  Laterality: N/A;   COLON RESECTION N/A 10/04/2022   Procedure: SEGMENTAL COLON RESECTION;  Surgeon: Jovita Kussmaul, MD;  Location: [place];  Service: General;  Laterality: N/A;   COLONOSCOPY  10/13/2022   Procedure: RESECTION OF COLON ANASTOMOSIS AND END COLOSCOPY;  Surgeon: Mickeal Skinner, MD;  Location: [place];  Service: General;;   COLOSTOMY  02/27/2012   Procedure: COLOSTOMY;  Surgeon: Merrie Roof, MD;  Location: [place];  Service: General;  Laterality: N/A;   COLOSTOMY TAKEDOWN  07/23/2012   COLOSTOMY TAKEDOWN  07/23/2012   Procedure: COLOSTOMY TAKEDOWN;  Surgeon: Merrie Roof,  MD;  Location: [place];  Service: General;  Laterality: N/A;  Primary Anastomosis   ELBOW FRACTURE SURGERY  ~ 1975   left;  pins inserted    EXCISION OF MESH N/A 10/04/2022   Procedure: REMOVAL OF MESH;  Surgeon: Jovita Kussmaul, MD;  Location: [place];  Service: General;  Laterality: N/A;   INCISION AND DRAINAGE ABSCESS N/A 04/05/2021   Procedure: INCISION AND DRAINAGE ABDOMINAL WALL ABSCESS;  Surgeon: Jovita Kussmaul, MD;  Location: [place];  Service: General;  Laterality: N/A;   INSERTION OF MESH N/A 05/20/2013   Procedure: INSERTION OF MESH;  Surgeon: Merrie Roof, MD;  Location: [place];  Service: General;  Laterality: N/A;   [place] N/A 10/04/2022   Procedure: DRAINAGE ABDOMIAL WALL ABSCESS;  Surgeon: Jovita Kussmaul, MD;  Location: [place];  Service: General;  Laterality: N/A;   LAPAROSCOPIC LEFT COLON RESECTION  02/19/2012   SIGMOID   LAPAROTOMY  02/27/2012   Procedure: EXPLORATORY LAPAROTOMY;  Surgeon: Merrie Roof, MD;  Location: [place];  Service: General;  Laterality: N/A;   LAPAROTOMY  07/23/2012   Procedure: EXPLORATORY LAPAROTOMY;  Surgeon: Merrie Roof, MD;  Location: [facility];  Service: General;  Laterality: N/A;   LAPAROTOMY N/A 10/04/2022   Procedure: EXPLORATORY LAPAROTOMY;  Surgeon: Jovita Kussmaul, MD;  Location: [place];  Service: General;  Laterality: N/A;   LAPAROTOMY N/A 10/13/2022   Procedure: EXPLORATORY LAPAROTOMY;  Surgeon: Mickeal Skinner, MD;  Location: [place];  Service: General;  Laterality: N/A;   LESION DESTRUCTION  07/23/2012   Procedure: DESTRUCTION LESION ANUS;  Surgeon: Merrie Roof, MD;  Location: [facility];  Service: General;  Laterality: N/A;  DESTRUCTION ANAL CONDYLOMA   LYSIS OF ADHESION  07/23/2012   Procedure: LYSIS OF ADHESION;  Surgeon: Merrie Roof, MD;  Location: [place];  Service: General;  Laterality: N/A;   LYSIS OF ADHESION N/A 10/04/2022   Procedure: LYSIS OF ADHESION;  Surgeon: Jovita Kussmaul, MD;  Location: [place];  Service:  General;  Laterality: N/A;   VENTRAL HERNIA REPAIR  05/20/2013   Dr Marlon Pel HERNIA REPAIR N/A 05/20/2013   Procedure: VENTRAL HERNIA REPAIR ;  Surgeon: Merrie Roof, MD;  Location: [facility];  Service: General;  Laterality: N/A;   WART FULGURATION  02/19/2012  Procedure: FULGURATION ANAL WART;  Surgeon: Merrie Roof, MD;  Location: [place];  Service: General;  Laterality: N/A;  Destroy  Anal Condyloma    WISDOM TOOTH EXTRACTION  ~ [place] N/A 06/03/2016   Procedure: ABDOMINAL WOUND EXPLORATION AND STITCH REMOVAL;  Surgeon: [name], MD;  Location: [place];  Service: General;  Laterality: N/A;  ABDOMINAL WOUND EXPLORATION AND STITCH REMOVAL     Social History:   reports that he has been smoking cigarettes. He started smoking about 41 years ago. He has a 68.00 pack-year smoking history. He quit smokeless tobacco use about 11 years ago.  His smokeless tobacco use included chew. He reports that he does not currently use alcohol. He reports current drug use. Drugs: Oxycodone and Marijuana.   Family History:  His family history includes Alcohol abuse in his father; Anxiety disorder in his father; Breast cancer in his maternal aunt; Colon polyps in his father; Depression in his mother; Diabetes in his maternal grandmother and mother; Hypertension in his father; Parkinsonism in his mother; Ulcers in his father. There is no history of Stroke, Heart disease, Colon cancer, or Stomach cancer.   Allergies Allergies  Allergen Reactions   Ambien [Zolpidem Tartrate] Other (See Comments)    Caused agitation of mood.    Bactroban [Mupirocin Calcium] Swelling    Caused infection at a surgical site   Darvocet [Propoxyphene N-Acetaminophen] Hives and Itching   Zestril [Lisinopril] Cough   Morphine And Related Itching     Home Medications  Prior to Admission medications   Medication Sig Start Date End Date Taking? Authorizing Provider  albuterol (PROVENTIL)  (2.5 MG/3ML) 0.083% nebulizer solution Take 3 mLs (2.5 mg total) by nebulization every 6 (six) hours as needed for wheezing or shortness of breath. 05/19/20  Yes Matilde Haymaker, MD  albuterol (VENTOLIN HFA) 108 (90 Base) MCG/ACT inhaler Inhale 2 puffs into the lungs every 6 (six) hours as needed for wheezing or shortness of breath. 06/24/22  Yes Gerrit Heck, MD  busPIRone (BUSPAR) 15 MG tablet TAKE 1 TABLET BY MOUTH IN THE MORNING. 09/23/22  Yes Dameron, Luna Fuse, DO  cyclobenzaprine (FLEXERIL) 10 MG tablet TAKE 1 TABLET BY MOUTH DAILY AS NEEDED FOR MUSCLE SPASM. NEED OFFICE VISIT FOR REFILLS 10/01/22  Yes Gerrit Heck, MD  gabapentin (NEURONTIN) 100 MG capsule TAKE 1 CAPSULE BY MOUTH FOUR TIMES DAILY AS NEEDED Patient taking differently: Take 100 mg by mouth 4 (four) times daily as needed (Neuropathic pain). 06/24/22  Yes Gerrit Heck, MD  losartan (COZAAR) 100 MG tablet TAKE 1 TABLET (100 MG TOTAL) BY MOUTH DAILY. 09/23/22  Yes Dameron, Luna Fuse, DO  pantoprazole (PROTONIX) 40 MG tablet TAKE 1 TABLET BY MOUTH DAILY 09/23/22  Yes Dameron, Luna Fuse, DO  sertraline (ZOLOFT) 100 MG tablet TAKE 1 TABLET (100 MG TOTAL) BY MOUTH DAILY. 08/29/22  Yes Gerrit Heck, MD  benzonatate (TESSALON PERLES) 100 MG capsule Take 1 capsule (100 mg total) by mouth 3 (three) times daily as needed for cough. Patient not taking: Reported on 10/01/2022 09/02/22   Gerrit Heck, MD  ondansetron (ZOFRAN-ODT) 4 MG disintegrating tablet Take 1 tablet (4 mg total) by mouth every 8 (eight) hours as needed for nausea or vomiting. 06/24/22   Gerrit Heck, MD  umeclidinium-vilanterol (ANORO ELLIPTA) 62.5-25 MCG/ACT AEPB Inhale 1 puff into the lungs daily. Patient not taking: Reported on 10/01/2022 09/02/22   Gerrit Heck, MD  varenicline (CHANTIX) 0.5 MG tablet Take 1 tablet (0.5 mg  total) by mouth 2 (two) times daily with a meal. Once daily for the first week then twice daily Patient not taking: Reported on 08/08/2022 07/01/22    Zenia Resides, MD  lurasidone (LATUDA) 40 MG TABS tablet TAKE 1 TABLET(40 MG) BY MOUTH DAILY WITH BREAKFAST 05/12/19 05/08/20  Nuala Alpha, MD  mometasone-formoterol (DULERA) 100-5 MCG/ACT AERO Inhale 2 puffs into the lungs 2 (two) times daily. 02/22/19 05/19/20  Nuala Alpha, MD

## 2022-10-15 NOTE — Evaluation (Signed)
Occupational Therapy Evaluation Patient Details Name: Timothy [name] MRN: [id number] DOB: 17-Apr-1966 Today's Date: 10/15/2022   History of Present Illness 57 y/o male admitted for abdominal wall abscess. Around 9 yrs s/p ventral hernia repair with mesh. Now post-op exploratory laparotomy and drainage abdominal wall abscess 10/04/22 and repeat exploratory laparotomy with resection of colon 10/13/22.   Clinical Impression   Patient evaluated by Occupational Therapy with no further acute OT needs identified.Pt is very close to baseline requiring min guard to SBA to complete BADL tasks at acute level. Assist required with line management primarily. Referral made to mobility technician and pt will continue to work with PT acutely.  Prior to admit, pt was independent with all ADL tasks and functional mobility without need of DME. See below for any follow-up Occupational Therapy. OT is signing off. Thank you for this referral.       Recommendations for follow up therapy are one component of a multi-disciplinary discharge planning process, led by the attending physician.  Recommendations may be updated based on patient status, additional functional criteria and insurance authorization.   Assistance Recommended at Discharge PRN  Patient can return home with the following Assist for transportation;Assistance with cooking/housework    Functional Status Assessment  Patient has had a recent decline in their functional status and demonstrates the ability to make significant improvements in function in a reasonable and predictable amount of time.  Equipment Recommendations  None recommended by OT       Precautions / Restrictions Precautions Precautions: Fall Precaution Comments: PCA, colostomy, midline JP drain Restrictions Weight Bearing Restrictions: No      Mobility Bed Mobility Overal bed mobility:  (sitting on EOB with PT upon OT arrival.)    Transfers Overall transfer level: Needs  assistance Equipment used: Rolling walker (2 wheels) Transfers: Sit to/from Stand Sit to Stand: Min guard           General transfer comment: assist with lines      Balance Overall balance assessment: Needs assistance   Sitting balance-Leahy Scale: Good     Standing balance support: During functional activity Standing balance-Leahy Scale: Fair Standing balance comment: able to stand without assist. RW used for functional mobility       ADL either performed or assessed with clinical judgement   ADL Overall ADL's : At baseline     General ADL Comments: Patient is able to complete all BADL tasks with SBA/set-up due to line management primarily.     Vision Baseline Vision/History: 1 Wears glasses (reading) Ability to See in Adequate Light: 0 Adequate Patient Visual Report: No change from baseline Vision Assessment?: No apparent visual deficits     Perception Perception Perception Tested?: No Spatial deficits: WFL   Praxis Praxis Praxis tested?: Within functional limits    Pertinent Vitals/Pain Pain Assessment Pain Assessment: 0-10 Pain Score: 5  Pain Location: abdomen Pain Descriptors / Indicators: Aching, Discomfort Pain Intervention(s): Repositioned, Monitored during session     Hand Dominance Right   Extremity/Trunk Assessment Upper Extremity Assessment Upper Extremity Assessment: Defer to OT evaluation   Lower Extremity Assessment Lower Extremity Assessment: Overall WFL for tasks assessed   Cervical / Trunk Assessment Cervical / Trunk Assessment: Normal   Communication Communication Communication: No difficulties   Cognition Arousal/Alertness: Awake/alert Behavior During Therapy: WFL for tasks assessed/performed Overall Cognitive Status: Within Functional Limits for tasks assessed       General Comments  VSS on 4L O2  Home Living Family/patient expects to be discharged to:: Private residence Living Arrangements: Children  (son lives with him)   Type of Home: House Home Access: Stairs to enter Technical brewer of Steps: 3 Entrance Stairs-Rails: Right Home Layout: One level     Bathroom Shower/Tub: Teacher, early years/pre: Handicapped height (handcapped in Restaurant manager, fast food, standard in other bathroom)     Home Equipment: None   Additional Comments: [place]. Has multiple dogs. One had a recent litter of pups.      Prior Functioning/Environment Prior Level of Function : Independent/Modified Independent             Mobility Comments: on disability          OT Problem List: Decreased strength      OT Treatment/Interventions:   Eval only   OT Goals(Current goals can be found in the care plan section) Acute Rehab OT Goals Patient Stated Goal: to get out of bed and move  OT Frequency:  1 time visit    Co-evaluation PT/OT/SLP Co-Evaluation/Treatment: Yes Reason for Co-Treatment: For patient/therapist safety;To address functional/ADL transfers PT goals addressed during session: Mobility/safety with mobility;Balance OT goals addressed during session: Strengthening/ROM;Proper use of Adaptive equipment and DME;ADL's and self-care      AM-PAC OT "6 Clicks" Daily Activity     Outcome Measure Help from another person eating meals?: None Help from another person taking care of personal grooming?: None Help from another person toileting, which includes using toliet, bedpan, or urinal?: None Help from another person bathing (including washing, rinsing, drying)?: None Help from another person to put on and taking off regular upper body clothing?: None Help from another person to put on and taking off regular lower body clothing?: None 6 Click Score: 24   End of Session Equipment Utilized During Treatment: Gait belt;Rolling walker (2 wheels);Oxygen Nurse Communication: Mobility status  Activity Tolerance: Patient tolerated treatment well;No increased pain Patient left: in  chair;with call bell/phone within reach;with chair alarm set  OT Visit Diagnosis: Muscle weakness (generalized) (M62.81)                Time: [id number] OT Time Calculation (min): 15 min Charges:  OT General Charges $OT Visit: 1 Visit OT Evaluation $OT Eval Low Complexity: 1 Low  Ailene Ravel, OTR/L,CBIS  Supplemental OT - [facility] and [facility] Secure Chat Preferred    Darryle Dennie, Clarene Duke 10/15/2022, 10:38 AM

## 2022-10-15 NOTE — Progress Notes (Signed)
Nutrition Follow-up  DOCUMENTATION CODES:   Non-severe (moderate) malnutrition in context of social or environmental circumstances  INTERVENTION:   Boost Breeze po TID, each supplement provides 250 kcal and 9 grams of protein  Encourage PO intake as diet advances  If pt does not tolerate recommend TPN ASAP as pt met criteria for moderate malnutrition on admission and has had inadequate nutrition during his 11 day hospitalization   Pt is at high refeeding risk due to malnutrition and poor nutrition this admission  Discussed importance of nutrition for recovery, especially protein intake  NUTRITION DIAGNOSIS:   Moderate Malnutrition related to social / environmental circumstances as evidenced by mild fat depletion, moderate muscle depletion. Ongoing  GOAL:   Patient will meet greater than or equal to 90% of their needs Progressing with diet advancement   MONITOR:   Diet advancement, Labs, Weight trends, I & O's  REASON FOR ASSESSMENT:   Malnutrition Screening Tool    ASSESSMENT:   57 year old male who presented on 3/15 with abdominal wall abscess. PMH of GERD, anxiety, ETOH abuse, depression, HTN, bipolar disorder, hx colostomy and colostomy takedown.  Pt discussed during ICU rounds and with RN.   TPN consult 3/25 but did not meet cut off times. Pt also refused PICC placement yesterday (3/25) afternoon. Today diet being advanced to clears and holding on TPN now with ostomy output.   Spoke with pt, he had tolerated some clear liquids this am and is willing to try boost breeze while on clears and ok with changing to ensure once diet advances  3/15 - s/p ex lap for drainage of abd wall abscess, segmental colon resection 3/24 - s/p emergent ex lap due to anastomotic leak s/p resection of colon anastomosis with end colostomy  3/25 - extubated; NG tube removed  Medications reviewed and include: protonix  LR @ 125 ml/hr Mag sulfate x 1   Labs reviewed:  Magnesium  1.3 CBG's: 88-139  JP abd: 110 ml  Ostomy: 80 ml   Diet Order:   Diet Order             Diet clear liquid Room service appropriate? Yes with Assist; Fluid consistency: Thin  Diet effective now                   EDUCATION NEEDS:   Education needs have been addressed  Skin:  Skin Assessment: Skin Integrity Issues: Skin Integrity Issues:: Incisions Incisions: closed abdomen  Last BM:  80 ml via ostomy  Height:   Ht Readings from Last 1 Encounters:  10/14/22 5\' 9"  (1.753 m)    Weight:   Wt Readings from Last 1 Encounters:  10/04/22 72.6 kg    BMI:  Body mass index is 23.63 kg/m.  Estimated Nutritional Needs:   Kcal:  2150-2550 kcals  Protein:  110-130 grams  Fluid:  >/= 2.1 L  Charman Blasco P., RD, LDN, CNSC See AMiON for contact information

## 2022-10-16 LAB — BASIC METABOLIC PANEL
Anion gap: 14 (ref 5–15)
BUN: 6 mg/dL (ref 6–20)
CO2: 28 mmol/L (ref 22–32)
Calcium: 8.3 mg/dL — ABNORMAL LOW (ref 8.9–10.3)
Chloride: 97 mmol/L — ABNORMAL LOW (ref 98–111)
Creatinine, Ser: 0.85 mg/dL (ref 0.61–1.24)
GFR, Estimated: >60 mL/min (ref >60)
Glucose, Bld: 87 mg/dL (ref 70–99)
Potassium: 3.7 mmol/L (ref 3.5–5.1)
Sodium: 139 mmol/L (ref 135–145)

## 2022-10-16 LAB — GLUCOSE, CAPILLARY
Glucose-Capillary: 88 mg/dL (ref 70–99)
Glucose-Capillary: 91 mg/dL (ref 70–99)
Glucose-Capillary: 105 mg/dL — ABNORMAL HIGH (ref 70–99)
Glucose-Capillary: 102 mg/dL — ABNORMAL HIGH (ref 70–99)

## 2022-10-16 LAB — MAGNESIUM: Magnesium: 1.8 mg/dL (ref 1.7–2.4)

## 2022-10-16 MED ORDER — OXYCODONE HCL 5 MG PO TABS
5.0000 mg | ORAL_TABLET | ORAL | Status: DC | PRN
  Administered 2022-10-16 – 2022-10-19 (×8): 5 mg via ORAL
  Filled 2022-10-16 (×8): qty 1

## 2022-10-16 MED ORDER — PANTOPRAZOLE SODIUM 40 MG PO TBEC
40.0000 mg | DELAYED_RELEASE_TABLET | Freq: Every day | ORAL | Status: DC
  Administered 2022-10-16 – 2022-10-18 (×3): 40 mg via ORAL
  Filled 2022-10-16 (×3): qty 1

## 2022-10-16 MED ORDER — ENSURE ENLIVE PO LIQD: 237.0000 mL | Freq: Two times a day (BID) | ORAL | Status: DC

## 2022-10-16 NOTE — Progress Notes (Signed)
Received patient as transfer from ICU. Patient alert and oriented x4, wife at bedside.  Stat lock applied for foley. PCA dilaudid in use, history reviewed and documented.  Dry dressing to abdominal incision.  JP drain charged, serosanguinous output.  Colostomy, dressing C/D/I, serous output, beefy stoma, pouch burped.  2 L [place] in use, IS teaching and return demonstration.  Specimen collection containers available in room. Orders reviewed, plan of care discussed, pt verbalized understanding. Safety measures implemented. NAD, handoff to night shift nurse.

## 2022-10-16 NOTE — Plan of Care (Signed)

## 2022-10-16 NOTE — Progress Notes (Addendum)
  Transition of Care [facility]) Screening Note   Patient Details  Name: Timothy [name] Date of Birth: 23-Oct-1965   Transition of Care [facility]) CM/SW Contact:    Benard Halsted, LCSW Phone Number: 10/16/2022, 4:14 PM    Patient transferred to 4N. Transition of Care Department [facility]) has reviewed patient and will follow for wound care needs. We will continue to monitor patient advancement through interdisciplinary progression rounds. If new patient transition needs arise, please place a TOC consult.

## 2022-10-16 NOTE — Progress Notes (Addendum)
3 Days Post-Op   Subjective/Chief Complaint: No complaints   Objective: Vital signs in last 24 hours: Temp:  [97.4 F (36.3 C)-98.3 F (36.8 C)] 98.3 F (36.8 C) (03/27 0800) Pulse Rate:  [96-120] 114 (03/27 0800) Resp:  [15-23] 17 (03/27 0800) BP: (117-171)/(68-103) 171/93 (03/27 0800) SpO2:  [94 %-98 %] 97 % (03/27 0800) Last BM Date : 10/16/22 (colostomy put out 30 mLs last night)  Intake/Output from previous day: 03/26 0701 - 03/27 0700 In: 3440.1 [P.O.:240; I.V.:2700.6; IV Piggyback:499.5] Out: LI:301249 [Urine:7550; Drains:65; Stool:70] Intake/Output this shift: Total I/O In: 125 [I.V.:125] Out: -   General appearance: alert and cooperative Resp: clear to auscultation bilaterally Cardio: regular rate and rhythm GI: soft, nontender, ostomy pink with air in bag  Lab Results:  Recent Labs    10/13/22 1829 10/13/22 2250 10/14/22 0102 10/14/22 0752  WBC 23.1*  --   --  17.7*  HGB 11.1*   < > 8.8* 8.8*  HCT 34.5*   < > 26.0* 27.8*  PLT 694*  --   --  571*   < > = values in this interval not displayed.   BMET Recent Labs    10/15/22 0357 10/16/22 0533  NA 136 139  K 4.1 3.7  CL 101 97*  CO2 26 28  GLUCOSE 94 87  BUN 11 6  CREATININE 1.26* 0.85  CALCIUM 7.7* 8.3*   PT/INR Recent Labs    10/14/22 0752  LABPROT 15.8*  INR 1.3*   ABG Recent Labs    10/13/22 2250 10/14/22 0102  PHART 7.364 7.308*  HCO3 27.8 22.1    Studies/Results: [place] EKG SITE RITE  Result Date: 10/14/2022 If Site Rite image not attached, placement could not be confirmed due to current cardiac rhythm.   Anti-infectives: Anti-infectives (From admission, onward)    Start     Dose/Rate Route Frequency Ordered Stop   10/14/22 1445  linezolid (ZYVOX) IVPB 600 mg        600 mg 300 mL/hr over 60 Minutes Intravenous Every 12 hours 10/14/22 1354     10/14/22 0927  vancomycin variable dose per unstable renal function (pharmacist dosing)  Status:  Discontinued         Does not apply  See admin instructions 10/14/22 0928 10/14/22 1354   10/10/22 0800  piperacillin-tazobactam (ZOSYN) IVPB 3.375 g        3.375 g 12.5 mL/hr over 240 Minutes Intravenous Every 8 hours 10/10/22 0728 10/15/22 0759   10/09/22 0045  vancomycin (VANCOCIN) IVPB 1000 mg/200 mL premix  Status:  Discontinued        1,000 mg 200 mL/hr over 60 Minutes Intravenous Every 12 hours 10/08/22 1158 10/14/22 0928   10/08/22 1245  vancomycin (VANCOREADY) IVPB 1250 mg/250 mL        1,250 mg 166.7 mL/hr over 90 Minutes Intravenous  Once 10/08/22 1158 10/08/22 1639   10/04/22 1700  piperacillin-tazobactam (ZOSYN) IVPB 3.375 g        3.375 g 12.5 mL/hr over 240 Minutes Intravenous Every 8 hours 10/04/22 1613 10/09/22 1416   10/04/22 0615  ceFAZolin (ANCEF) IVPB 2g/100 mL premix        2 g 200 mL/hr over 30 Minutes Intravenous On call to O.R. 10/04/22 0601 10/04/22 1131       Assessment/Plan: s/p Procedure(s): EXPLORATORY LAPAROTOMY (N/A) RESECTION OF COLON ANASTOMOSIS AND END COLOSCOPY Advance diet. Allow fulls today Decrease IVF POD 12/3 Continue zosyn and linezolid AKI improved Continue dressing changes and ostomy care  Transfer to floor  LOS: 12 days    [name] 10/16/2022

## 2022-10-17 LAB — CBC
HCT: 28.4 % — ABNORMAL LOW (ref 39.0–52.0)
Hemoglobin: 9.2 g/dL — ABNORMAL LOW (ref 13.0–17.0)
MCH: 30.9 pg (ref 26.0–34.0)
MCHC: 32.4 g/dL (ref 30.0–36.0)
MCV: 95.3 fL (ref 80.0–100.0)
Platelets: 888 10*3/uL — ABNORMAL HIGH (ref 150–400)
RBC: 2.98 MIL/uL — ABNORMAL LOW (ref 4.22–5.81)
RDW: 14.6 % (ref 11.5–15.5)
WBC: 13.4 10*3/uL — ABNORMAL HIGH (ref 4.0–10.5)
nRBC: 0 % (ref 0.0–0.2)

## 2022-10-17 MED ORDER — HYDROMORPHONE HCL 1 MG/ML IJ SOLN
0.5000 mg | INTRAMUSCULAR | Status: DC | PRN
  Administered 2022-10-17 – 2022-10-19 (×14): 1 mg via INTRAVENOUS
  Filled 2022-10-17 (×14): qty 1

## 2022-10-17 MED ORDER — LOSARTAN POTASSIUM 50 MG PO TABS
100.0000 mg | ORAL_TABLET | Freq: Every day | ORAL | Status: DC
  Administered 2022-10-17 – 2022-10-19 (×3): 100 mg via ORAL
  Filled 2022-10-17 (×3): qty 2

## 2022-10-17 MED ORDER — PIPERACILLIN-TAZOBACTAM 3.375 G IVPB
3.3750 g | Freq: Three times a day (TID) | INTRAVENOUS | Status: DC
  Administered 2022-10-17 – 2022-10-19 (×6): 3.375 g via INTRAVENOUS
  Filled 2022-10-17 (×6): qty 50

## 2022-10-17 NOTE — Progress Notes (Addendum)
Care assumed on Pt. Pt greeted and repositioned vitals taken and assessed. Pt MEWS yellow. Pt c/o oj URQ pain. JP drain site assessed and emptied. Pt A/O 4. Resp even and unlabored. Pt denies SHOB at rest. Increased WOB with exertion. Please see MAR for medication administration. RN will give meds and reassess Pt for outcome.   MEWS yellow upon initial assessment. HR in 130's BP SB greater than 160. PRN 24ml of metoprolol  plus 1 mg dilaudid given per PRN orders. Vitals rechecked and MEWS is currentl green. HR and BP decreased and Pain has improved.

## 2022-10-17 NOTE — Progress Notes (Signed)
Physical Therapy Treatment Patient Details Name: Timothy [name] MRN: [id number] DOB: Aug 19, 1965 Today's Date: 10/17/2022   History of Present Illness 57 y/o male admitted for abdominal wall abscess. Around 9 yrs s/p ventral hernia repair with mesh. Now post-op exploratory laparotomy and drainage abdominal wall abscess 10/04/22 and repeat exploratory laparotomy with resection of colon 10/13/22.    PT Comments    Pt was received in supine and agreeable to session. Pt able to perform bed mobility with supervision, but demonstrating impaired balance when moving to EOB requiring UE support, but no assist. Pt demonstrating slight impulsivity with mobility by standing quickly with BUE on RW pushed forward despite cues for safe hand placement, but no unsteadiness noted. Pt required assist with line management throughout. Pt able to tolerate increased gait distance this session with cues for upright posture and RW proximity, however pt reporting that he is "comfortable like this" due to abdominal pain. Pt continues to benefit from PT services to progress toward functional mobility goals.     Recommendations for follow up therapy are one component of a multi-disciplinary discharge planning process, led by the attending physician.  Recommendations may be updated based on patient status, additional functional criteria and insurance authorization.  Follow Up Recommendations       Assistance Recommended at Discharge PRN  Patient can return home with the following A little help with walking and/or transfers;A little help with bathing/dressing/bathroom;Help with stairs or ramp for entrance;Assist for transportation   Equipment Recommendations  Rolling walker (2 wheels)    Recommendations for Other Services       Precautions / Restrictions Precautions Precautions: Fall Precaution Comments: PCA, colostomy, midline JP drain Restrictions Weight Bearing Restrictions: No     Mobility  Bed Mobility Overal  bed mobility: Needs Assistance Bed Mobility: Supine to Sit, Sit to Supine     Supine to sit: Supervision, HOB elevated Sit to supine: Supervision, HOB elevated   General bed mobility comments: Pt requiring increased time and effort, but no physical assist    Transfers Overall transfer level: Needs assistance Equipment used: Rolling walker (2 wheels) Transfers: Sit to/from Stand Sit to Stand: Min guard           General transfer comment: Cues for safe hand placement and RW proximity, but pt with good power up.    Ambulation/Gait Ambulation/Gait assistance: Min guard, Supervision Gait Distance (Feet): 550 Feet Assistive device: Rolling walker (2 wheels) Gait Pattern/deviations: Step-through pattern, Trunk flexed, Decreased stride length       General Gait Details: Pt demonstrating step-through pattern and good cadence. Pt requiring cues for upright posture and RW proximity due to pt leaning over and pushing RW too far ahead, but pt reporting that he is comfortable like that.       Balance Overall balance assessment: Needs assistance Sitting-balance support: No upper extremity supported, Feet supported Sitting balance-Leahy Scale: Good Sitting balance - Comments: sitting EOB   Standing balance support: During functional activity, Bilateral upper extremity supported Standing balance-Leahy Scale: Fair Standing balance comment: with RW support                            Cognition Arousal/Alertness: Awake/alert Behavior During Therapy: WFL for tasks assessed/performed Overall Cognitive Status: Within Functional Limits for tasks assessed  Exercises      General Comments General comments (skin integrity, edema, etc.): Pt on 2L O2 throughout session      Pertinent Vitals/Pain Pain Assessment Pain Assessment: 0-10 Pain Score: 5  Pain Location: abdomen Pain Descriptors / Indicators: Aching,  Discomfort, Guarding Pain Intervention(s): Limited activity within patient's tolerance, Monitored during session     PT Goals (current goals can now be found in the care plan section) Acute Rehab PT Goals Patient Stated Goal: to return to independent PT Goal Formulation: With patient Time For Goal Achievement: 10/29/22 Potential to Achieve Goals: Good Progress towards PT goals: Progressing toward goals    Frequency    Min 3X/week      PT Plan Current plan remains appropriate       AM-PAC PT "6 Clicks" Mobility   Outcome Measure  Help needed turning from your back to your side while in a flat bed without using bedrails?: A Little Help needed moving from lying on your back to sitting on the side of a flat bed without using bedrails?: A Little Help needed moving to and from a bed to a chair (including a wheelchair)?: A Little Help needed standing up from a chair using your arms (e.g., wheelchair or bedside chair)?: A Little Help needed to walk in hospital room?: A Little Help needed climbing 3-5 steps with a railing? : A Little 6 Click Score: 18    End of Session Equipment Utilized During Treatment: Oxygen;Gait belt Activity Tolerance: Patient tolerated treatment well Patient left: in bed;with call bell/phone within reach Nurse Communication: Mobility status PT Visit Diagnosis: Difficulty in walking, not elsewhere classified (R26.2);Pain     Time: [id number] PT Time Calculation (min) (ACUTE ONLY): 32 min  Charges:  $Gait Training: 23-37 mins                     Michelle Nasuti, PTA Acute Rehabilitation Services Secure Chat Preferred  [phone]    Michelle Nasuti 10/17/2022, 2:00 PM

## 2022-10-17 NOTE — Progress Notes (Signed)
4 Days Post-Op   Subjective/Chief Complaint: No complaints   Objective: Vital signs in last 24 hours: Temp:  [97.6 F (36.4 C)-98.3 F (36.8 C)] 98.2 F (36.8 C) (03/28 1018) Pulse Rate:  [98-110] 110 (03/28 1018) Resp:  [12-19] 19 (03/28 1136) BP: (153-172)/(83-102) 167/94 (03/28 1018) SpO2:  [92 %-98 %] 97 % (03/28 1136) Last BM Date : 10/16/22  Intake/Output from previous day: 03/27 0701 - 03/28 0700 In: 620.9 [I.V.:320.9; IV Piggyback:300] Out: 5520 [Urine:5500; Drains:20] Intake/Output this shift: No intake/output data recorded.  General appearance: alert and cooperative Resp: clear to auscultation bilaterally Cardio: regular rate and rhythm GI: soft, mild tenderness. Ostomy pink with flatus. Wound clean  Lab Results:  No results for input(s): "WBC", "HGB", "HCT", "PLT" in the last 72 hours. BMET Recent Labs    10/15/22 0357 10/16/22 0533  NA 136 139  K 4.1 3.7  CL 101 97*  CO2 26 28  GLUCOSE 94 87  BUN 11 6  CREATININE 1.26* 0.85  CALCIUM 7.7* 8.3*   PT/INR No results for input(s): "LABPROT", "INR" in the last 72 hours. ABG No results for input(s): "PHART", "HCO3" in the last 72 hours.  Invalid input(s): "PCO2", "PO2"  Studies/Results: No results found.  Anti-infectives: Anti-infectives (From admission, onward)    Start     Dose/Rate Route Frequency Ordered Stop   10/14/22 1445  linezolid (ZYVOX) IVPB 600 mg        600 mg 300 mL/hr over 60 Minutes Intravenous Every 12 hours 10/14/22 1354     10/14/22 0927  vancomycin variable dose per unstable renal function (pharmacist dosing)  Status:  Discontinued         Does not apply See admin instructions 10/14/22 0928 10/14/22 1354   10/10/22 0800  piperacillin-tazobactam (ZOSYN) IVPB 3.375 g        3.375 g 12.5 mL/hr over 240 Minutes Intravenous Every 8 hours 10/10/22 0728 10/15/22 0759   10/09/22 0045  vancomycin (VANCOCIN) IVPB 1000 mg/200 mL premix  Status:  Discontinued        1,000 mg 200 mL/hr  over 60 Minutes Intravenous Every 12 hours 10/08/22 1158 10/14/22 0928   10/08/22 1245  vancomycin (VANCOREADY) IVPB 1250 mg/250 mL        1,250 mg 166.7 mL/hr over 90 Minutes Intravenous  Once 10/08/22 1158 10/08/22 1639   10/04/22 1700  piperacillin-tazobactam (ZOSYN) IVPB 3.375 g        3.375 g 12.5 mL/hr over 240 Minutes Intravenous Every 8 hours 10/04/22 1613 10/09/22 1416   10/04/22 0615  ceFAZolin (ANCEF) IVPB 2g/100 mL premix        2 g 200 mL/hr over 30 Minutes Intravenous On call to O.R. 10/04/22 0601 10/04/22 1131       Assessment/Plan: s/p Procedure(s): EXPLORATORY LAPAROTOMY (N/A) RESECTION OF COLON ANASTOMOSIS AND END COLOSCOPY Advance diet. Allow soft diet  Ambulate PT Continue IV zosyn and linezolid Check wbc POD 13/4 Continue dressing changes and ostomy care  LOS: 13 days    [name] 10/17/2022

## 2022-10-17 NOTE — TOC Progression Note (Signed)
Transition of Care (TOC) - Progression Note   Provided update to [facility] with [place] home health  Patient Details  Name: Timothy [name] MRN: [id number] Date of Birth: 1965/09/06  Transition of Care [facility]) CM/SW Contact  Geovani Tootle, Edson Snowball, RN Phone Number: 10/17/2022, 11:47 AM  Clinical Narrative:       Expected Discharge Plan: [place] Barriers to Discharge: Continued Medical Work up  Expected Discharge Plan and Services   Discharge Planning Services: CM Consult Post Acute Care Choice: [place] arrangements for the past 2 months: Single Family Home                 DME Arranged: N/A DME Agency: NA       HH Arranged: RN           Social Determinants of Health (SDOH) Interventions SDOH Screenings   Food Insecurity: No Food Insecurity (10/04/2022)  Recent Concern: [place] Present (07/31/2022)  Housing: [place]  (10/04/2022)  Transportation Needs: No Transportation Needs (10/04/2022)  Utilities: Not At Risk (10/04/2022)  Recent Concern: Utilities - At Risk (07/31/2022)  Alcohol Screen: Medium Risk (07/17/2022)  Depression (PHQ2-9): High Risk (09/02/2022)  Financial Resource Strain: Medium Risk (07/17/2022)  Physical Activity: Inactive (07/17/2022)  Social Connections: Socially Isolated (07/17/2022)  Stress: Stress Concern Present (07/17/2022)  Tobacco Use: High Risk (10/14/2022)    Readmission Risk Interventions     No data to display

## 2022-10-17 NOTE — Progress Notes (Signed)
RN did patients Dressing change and he tolerated well, pain medication given and patient is resting comfortably.

## 2022-10-17 NOTE — Progress Notes (Signed)
Pharmacy note - antibiotic stewardship  Called Dr. Ethlyn Gallery office for antibiotic clarifications.  Patient is currently on Linezolid and Dr. Ethlyn Gallery note indicates Zosyn should also be administered.  Spoke with on call PA-C [employer].  Orders received to resume Zosyn.  Heide Guile, PharmD, [place] Clinical Pharmacist Phone [phone]

## 2022-10-18 DIAGNOSIS — Z933 Colostomy status: Secondary | ICD-10-CM

## 2022-10-18 MED ORDER — SODIUM CHLORIDE 0.9% FLUSH: 3.0000 mL | INTRAVENOUS | Status: DC | PRN

## 2022-10-18 MED ORDER — PHENOL 1.4 % MT LIQD: 2.0000 | OROMUCOSAL | Status: DC | PRN

## 2022-10-18 MED ORDER — LIP MEDEX EX OINT
TOPICAL_OINTMENT | Freq: Two times a day (BID) | CUTANEOUS | Status: DC
  Administered 2022-10-18: 75 via TOPICAL
  Filled 2022-10-18: qty 7

## 2022-10-18 MED ORDER — SODIUM CHLORIDE 0.9 % IV SOLN: 250.0000 mL | INTRAVENOUS | Status: DC | PRN

## 2022-10-18 MED ORDER — MAGIC MOUTHWASH: 15.0000 mL | Freq: Four times a day (QID) | ORAL | Status: DC | PRN

## 2022-10-18 MED ORDER — ACETAMINOPHEN 650 MG RE SUPP: 650.0000 mg | Freq: Four times a day (QID) | RECTAL | Status: DC | PRN

## 2022-10-18 MED ORDER — SODIUM CHLORIDE 0.9% FLUSH
3.0000 mL | Freq: Two times a day (BID) | INTRAVENOUS | Status: DC
  Administered 2022-10-18 – 2022-10-19 (×3): 3 mL via INTRAVENOUS

## 2022-10-18 MED ORDER — MENTHOL 3 MG MT LOZG: 1.0000 | LOZENGE | OROMUCOSAL | Status: DC | PRN

## 2022-10-18 MED ORDER — LACTATED RINGERS IV BOLUS: 1000.0000 mL | Freq: Three times a day (TID) | INTRAVENOUS | Status: DC | PRN

## 2022-10-18 MED ORDER — SIMETHICONE 40 MG/0.6ML PO SUSP: 80.0000 mg | Freq: Four times a day (QID) | ORAL | Status: DC | PRN

## 2022-10-18 MED ORDER — ACETAMINOPHEN 325 MG PO TABS: 325.0000 mg | ORAL_TABLET | Freq: Four times a day (QID) | ORAL | Status: DC | PRN

## 2022-10-18 MED ORDER — SODIUM CHLORIDE 0.9% FLUSH
3.0000 mL | Freq: Two times a day (BID) | INTRAVENOUS | Status: DC
  Administered 2022-10-19: 3 mL via INTRAVENOUS

## 2022-10-18 NOTE — Care Management Important Message (Signed)
Important Message  Patient Details  Name: Timothy [name] MRN: [id number] Date of Birth: [birth year]   Medicare Important Message Given:  Yes     [name] 10/18/2022, 12:29 PM

## 2022-10-18 NOTE — Consult Note (Signed)
[facility] Nurse ostomy consult note Stoma type/location:LLQ, end colostomy  Stomal assessment/size: 2" round, budded, pink, moist Peristomal assessment: redness noted at sutures at 9 o'clock, tender with cleansing  Treatment options for stomal/peristomal skin: 2" skin barrier ring Output formed brown stool  Ostomy pouching: 2pc. 2 3/4" with 2" skin barrier ring Education provided:  Patient had colostomy/ileostomy in 2014; was cared for at that time by himself with girlfriend and [facility]  Demonstrated pouch change (cutting new skin barrier, measuring stoma, cleaning peristomal skin and stoma, use of barrier ring) Patient is able to verbalized cutting skin barrier, use of barrier ring, cleansing the skin, frequency of pouch changes.  Education on emptying when 1/3 to 1/2 full and how to empty Patient is able to reiterate how to empty and his able to demonstrate opening and closing lock and roll closure Patient is able to demonstrate "burping" flatus from pouch Discussed use of wick to clean spout  Discussed bathing, diet, gas, medication use, constipation Discussed risk of peristomal hernia   Enrolled patient in [place] program: Yes [place] [address]  [phone] (Home)  [email or web address]   Patient is antsy and reports he is going home soon, discussed with Dr. Johney Maine. Patient disconnected IV while WOc in the room, clamped his IV and proceeds to get up.  Leaving IV line in the floor bedside his bed, reminded patient importance of keeping line clean to prevent infections.  Encouraged ambulation with assistance  Educational materials left in patient's room. 6 sets of pouches and barrier rings in the patients room for potential DC to home.  Open abdominal wound that will need dressing changes  WOC Nurse will follow along with you for continued support with ostomy teaching and care [place] MSN, [place], [place], [place], [place]

## 2022-10-18 NOTE — Discharge Instructions (Addendum)
#######################################################  Ostomy Support Information  You've heard that people get along just fine with only one of their eyes, or one of their lungs, or one of their kidneys. But you also know that you have only one intestine and only one bladder, and that leaves you feeling awfully empty, both physically and emotionally: You think no other people go around without part of their intestine with the ends of their intestines sticking out through their abdominal walls.   YOU ARE NOT ALONE.  There are nearly three quarters of a million people in the [place] who have an ostomy; people who have had surgery to remove all or part of their colons or bladders.   There is even a national association, the [place] [employer] [place] with over 350 local affiliated support groups that are organized by volunteers who provide peer support and counseling. [facility] [employer] has a toll free telephone num-ber, [phone] and an educational, interactive website, www.ostomy.org   An ostomy is an opening in the belly (abdominal wall) made by surgery. Ostomates are people who have had this procedure. The opening (stoma) allows the kidney or bowel to grdischarge waste. An external pouch covers the stoma to collect waste. Pouches are are a simple bag and are odor free. Different companies have disposable or reusable pouches to fit one's lifestyle. An ostomy can either be temporary or permanent.   THERE ARE THREE MAIN TYPES OF OSTOMIES Colostomy. A colostomy is a surgically created opening in the large intestine (colon). Ileostomy. An ileostomy is a surgically created opening in the small intestine. Urostomy. A urostomy is a surgically created opening to divert urine away from the bladder.  OSTOMY Care  The following guidelines will make care of your colostomy easier. Keep this information close by for quick reference.  Helpful DIET hints Eat a well-balanced diet including vegetables and fresh  fruits. Eat on a regular schedule.  Drink at least 6 to 8 glasses of fluids daily. Eat slowly in a relaxed atmosphere. Chew your food thoroughly. Avoid chewing gum, smoking, and drinking from a straw. This will help decrease the amount of air you swallow, which may help reduce gas. Eating yogurt or drinking buttermilk may help reduce gas.  To control gas at night, do not eat after 8 p.m. This will give your bowel time to quiet down before you go to bed.  If gas is a problem, you can purchase Beano. Sprinkle Beano on the first bite of food before eating to reduce gas. It has no flavor and should not change the taste of your food. You can buy Beano over the counter at your local drugstore.  Foods like fish, onions, garlic, broccoli, asparagus, and cabbage produce odor. Although your pouch is odor-proof, if you eat these foods you may notice a stronger odor when emptying your pouch. If this is a concern, you may want to limit these foods in your diet.  If you have an ileostomy, you will have chronic diarrhea & need to drink more liquids to avoid getting dehydrated.  Consider antidiarrheal medicine like imodium (loperamide) or Lomotil to help slow down bowel movements / diarrhea into your ileostomy bag.  GETTING TO GOOD BOWEL HEALTH WITH AN ILEOSTOMY    With the colon bypassed & not in use, you will have small bowel diarrhea.   It is important to thicken & slow your bowel movements down.   The goal: 4-6 small BOWEL MOVEMENTS A DAY It is important to drink plenty of liquids to avoid  getting dehydrated  CONTROLLING ILEOSTOMY DIARRHEA  TAKE A FIBER SUPPLEMENT (FiberCon or Benefiner soluble fiber) twice a day - to thicken stools by absorbing excess fluid and retrain the intestines to act more normally.  Slowly increase the dose over a few weeks.  Too much fiber too soon can backfire and cause cramping & bloating.  TAKE AN IRON SUPPLEMENT twice a day to naturally constipate your bowels.  Usually  ferrous sulfate 326m twice a day)  TAKE ANTI-DIARRHEAL MEDICINES: Loperamide (Imodium) can slow down diarrhea.  Start with two tablets (= 4563m first and then try one tablet every 6 hours.  Can go up to 2 pills four times day (8 pills of 63m16max) Avoid if you are having fevers or severe pain.  If you are not better or start feeling worse, stop all medicines and call your doctor for advice LoMotil (Diphenoxylate / Atropine) is another medicine that can constipate & slow down bowel moevements Pepto Bismol (bismuth) can gently thicken bowels as well  If diarrhea is worse,: drink plenty of liquids and try simpler foods for a few days to avoid stressing your intestines further. Avoid dairy products (especially milk & ice cream) for a short time.  The intestines often can lose the ability to digest lactose when stressed. Avoid foods that cause gassiness or bloating.  Typical foods include beans and other legumes, cabbage, broccoli, and dairy foods.  Every person has some sensitivity to other foods, so listen to our body and avoid those foods that trigger problems for you.Call your doctor if you are getting worse or not better.  Sometimes further testing (cultures, endoscopy, X-ray studies, bloodwork, etc) may be needed to help diagnose and treat the cause of the diarrhea. Take extra anti-diarrheal medicines (maximum is 8 pills of 63mg66mperamide a day)   Tips for POUCHING an OSTOMY   Changing Your Pouch The best time to change your pouch is in the morning, before eating or drinking anything. Your stoma can function at any time, but it will function more after eating or drinking.   Applying the pouching system  Place all your equipment close at hand before removing your pouch.  Wash your hands.  Stand or sit in front of a mirror. Use the position that works best for you. Remember that you must keep the skin around the stoma wrinkle-free for a good seal.  Gently remove the used pouch (1-piece  system) or the pouch and old wafer (2-piece system). Empty the pouch into the toilet. Save the closure clip to use again.  Wash the stoma itself and the skin around the stoma. Your stoma may bleed a little when being washed. This is normal. Rinse and pat dry. You may use a wash cloth or soft paper towels (like Bounty), mild soap (like Dial, Safeguard, or IvorMongoliand water. Avoid soaps that contain perfumes or lotions.  For a new pouch (1-piece system) or a new wafer (2-piece system), measure your stoma using the stoma guide in each box of supplies.  Trace the shape of your stoma onto the back of the new pouch or the back of the new wafer. Cut out the opening. Remove the paper backing and set it aside.  Optional: Apply a skin barrier powder to surrounding skin if it is irritated (bare or weeping), and dust off the excess. Optional: Apply a skin-prep wipe (such as Skin Prep or All-Kare) to the skin around the stoma, and let it dry. Do not apply this solution if the  skin is irritated (red, tender, or broken) or if you have shaved around the stoma. Optional: Apply a skin barrier paste (such as Stomahesive, Coloplast, or Premium) around the opening cut in the back of the pouch or wafer. Allow it to dry for 30 to 60 seconds.  Hold the pouch (1-piece system) or wafer (2-piece system) with the sticky side toward your body. Make sure the skin around the stoma is wrinkle-free. Center the opening on the stoma, then press firmly to your abdomen (Fig. 4). Look in the mirror to check if you are placing the pouch, or wafer, in the right position. For a 2-piece system, snap the pouch onto the wafer. Make sure it snaps into place securely.  Place your hand over the stoma and the pouch or wafer for about 30 seconds. The heat from your hand can help the pouch or wafer stick to your skin.  Add deodorant (such as Super Banish or Nullo) to your pouch. Other options include food extracts such as vanilla oil and peppermint  extract. Add about 10 drops of the deodorant to the pouch. Then apply the closure clamp. Note: Do not use toxic  chemicals or commercial cleaning agents in your pouch. These substances may harm the stoma.  Optional: For extra seal, apply tape to all 4 sides around the pouch or wafer, as if you were framing a picture. You may use any brand of medical adhesive tape. Change your pouch every 5 to 7 days. Change it immediately if a leak occurs.  Wash your hands afterwards.  If you are wearing a 2-piece system, you may use 2 new pouches per week and alternate them. Rinse the pouch with mild soap and warm water and hang it to dry for the next day. Apply the fresh pouch. Alternate the 2 pouches like this for a week. After a week, change the wafer and begin with 2 new pouches. Place the old pouches in a plastic bag, and put them in the trash.   LIVING WITH AN OSTOMY  Emptying Your Pouch Empty your pouch when it is one-third full (of urine, stool, and/or gas). If you wait until your pouch is fuller than this, it will be more difficult to empty and more noticeable. When you empty your pouch, either put toilet paper in the toilet bowl first, or flush the toilet while you empty the pouch. This will reduce splashing. You can empty the pouch between your legs or to one side while sitting, or while standing or stooping. If you have a 2-piece system, you can snap off the pouch to empty it. Remember that your stoma may function during this time. If you wish to rinse your pouch after you empty it, a [place] baster can be helpful. When using a baster, squirt water up into the pouch through the opening at the bottom. With a 2-piece system, you can snap off the pouch to rinse it. After rinsing  your pouch, empty it into the toilet. When rinsing your pouch at home, put a few granules of Dreft soap in the rinse water. This helps lubricate and freshen your pouch. The inside of your pouch can be sprayed with non-stick cooking  oil (Pam spray). This may help reduce stool sticking to the inside of the pouch.  Bathing You may shower or bathe with your pouch on or off. Remember that your stoma may function during this time.  The materials you use to wash your stoma and the skin around it should be  clean, but they do not need to be sterile.  Wearing Your Pouch During hot weather, or if you perspire a lot in general, wear a cover over your pouch. This may prevent a rash on your skin under the pouch. Pouch covers are sold at ostomy supply stores. Wear the pouch inside your underwear for better support. Watch your weight. Any gain or loss of 10 to 15 pounds or more can change the way your pouch fits.  Going Away From Home A collapsible cup (like those that come in travel kits) or a soft plastic squirt bottle with a pull-up top (like a travel bottle for shampoo) can be used for rinsing your pouch when you are away from home. Tilt the opening of the pouch at an upward angle when using a cup to rinse.  Carry wet wipes or extra tissues to use in public bathrooms.  Carry an extra pouching system with you at all times.  Never keep ostomy supplies in the glove compartment of your car. Extreme heat or cold can damage the skin barriers and adhesive wafers on the pouch.  When you travel, carry your ostomy supplies with you at all times. Keep them within easy reach. Do not pack ostomy supplies in baggage that will be checked or otherwise separated from you, because your baggage might be lost. If you're traveling out of the country, it is helpful to have a letter stating that you are carrying ostomy supplies as a medical necessity.  If you need ostomy supplies while traveling, look in the yellow pages of the telephone book under "Surgical Supplies." Or call the local ostomy organization to find out where supplies are available.  Do not let your ostomy supplies get low. Always order new pouches before you use the last one.  Reducing  Odor Limit foods such as broccoli, cabbage, onions, fish, and garlic in your diet to help reduce odor. Each time you empty your pouch, carefully clean the opening of the pouch, both inside and outside, with toilet paper. Rinse your pouch 1 or 2 times daily after you empty it (see directions for emptying your pouch and going away from home). Add deodorant (such as Super Banish or Nullo) to your pouch. Use air deodorizers in your bathroom. Do not add aspirin to your pouch. Even though aspirin can help prevent odor, it could cause ulcers on your stoma.  When to call the doctor Call the doctor if you have any of the following symptoms: Purple, black, or white stoma Severe cramps lasting more than 6 hours Severe watery discharge from the stoma lasting more than 6 hours No output from the colostomy for 3 days Excessive bleeding from your stoma Swelling of your stoma to more than 1/2-inch larger than usual Pulling inward of your stoma below skin level Severe skin irritation or deep ulcers Bulging or other changes in your abdomen  When to call your ostomy nurse Call your ostomy/enterostomal therapy (WOCN) nurse if any of the following occurs: Frequent leaking of your pouching system Change in size or appearance of your stoma, causing discomfort or problems with your pouch Skin rash or rawness Weight gain or loss that causes problems with your pouch     FREQUENTLY ASKED QUESTIONS   Why haven't you met any of these folks who have an ostomy?  Well, maybe you have! You just did not recognize them because an ostomy doesn't show. It can be kept secret if you wish. Why, maybe some of your best friends, office  associates or neighbors have an ostomy ... you never can tell. People facing ostomy surgery have many quality-of-life questions like: Will you bulge? Smell? Make noises? Will you feel waste leaving your body? Will you be a captive of the toilet? Will you starve? Be a social outcast? Get/stay  married? Have babies? Easily bathe, go swimming, bend over?  OK, let's look at what you can expect:   Will you bulge?  Remember, without part of the intestine or bladder, and its contents, you should have a flatter tummy than before. You can expect to wear, with little exception, what you wore before surgery ... and this in-cludes tight clothing and bathing suits.   Will you smell?  Today, thanks to modern odor proof pouching systems, you can walk into an ostomy support group meeting and not smell anything that is foul or offensive. And, for those with an ileostomy or colostomy who are concerned about odor when emptying their pouch, there are in-pouch deodorants that can be used to eliminate any waste odors that may exist.   Will you make noises?  Everyone produces gas, especially if they are an air-swallower. But intestinal sounds that occur from time to time are no differ-ent than a gurgling tummy, and quite often your clothing will muffle any sounds.   Will you feel the waste discharges?  For those with a colostomy or ileostomy there might be a slight pressure when waste leaves your body, but understand that the intestines have no nerve endings, so there will be no unpleasant sensations. Those with a urostomy will probably be unaware of any kidney drainage.   Will you be a captive of the toilet?  Immediately post-op you will spend more time in the bathroom than you will after your body recovers from surgery. Every person is different, but on average those with an ileostomy or urostomy may empty their pouches 4 to 6 times a day; a little  less if you have a colostomy. The average wear time between pouch system changes is 3 to 5 days and the changing process should take less than 30 minutes.   Will I need to be on a special diet? Most people return to their normal diet when they have recovered from surgery. Be sure to chew your food well, eat a well-balanced diet and drink plenty of fluids. If  you experience problems with a certain food, wait a couple of weeks and try it again.  Will there be odor and noises? Pouching systems are designed to be odor-proof or odor-resistant. There are deodorants that can be used in the pouch. Medications are also available to help reduce odor. Limit gas-producing foods and carbonated beverages. You will experience less gas and fewer noises as you heal from surgery.  How much time will it take to care for my ostomy? At first, you may spend a lot of time learning about your ostomy and how to take care of it. As you become more comfortable and skilled at changing the pouching system, it will take very little time to care for it.   Will I be able to return to work? People with ostomies can perform most jobs. As soon as you have healed from surgery, you should be able to return to work. Heavy lifting (more than 10 pounds) may be discouraged.   What about intimacy? Sexual relationships and intimacy are important and fulfilling aspects of your life. They should continue after ostomy surgery. Intimacy-related concerns should be discussed openly between you and  your partner.   Can I wear regular clothing? You do not need to wear special clothing. Ostomy pouches are fairly flat and barely noticeable. Elastic undergarments will not hurt the stoma or prevent the ostomy from functioning.   Can I participate in sports? An ostomy should not limit your involvement in sports. Many people with ostomies are runners, skiers, swimmers or participate in other active lifestyles. Talk with your caregiver first before doing heavy physical activity.  Will you starve?  Not if you follow doctor's orders at each stage of your post-op adjustment. There is no such thing as an "ostomy diet". Some people with an ostomy will be able to eat and tolerate anything; others may find diffi-culty with some foods. Each person is an individual and must determine, by trial, what is best for  them. A good practice for all is to drink plenty of water.   Will you be a social outcast?  Have you met anyone who has an ostomy and is a social outcast? Why should you be the first? Only your attitude and self image will effect how you are treated. No confi-dent person is an Occupational psychologist.    PROFESSIONAL HELP   Resources are available if you need help or have questions about your ostomy.   Specially trained nurses called Wound, Ostomy Continence Nurses (WOCN) are available for consultation in most major medical centers.  Consider getting an ostomy consult at an outpatient ostomy clinic.   [facility] run by an Programmer, systems at the [facility].  [phone]. [facility] can help set up an appointment   The [employer] (UOA) is a group made up of many local chapters throughout the [place]. These local groups hold meetings and provide support to prospective and existing ostomates. They sponsor educational events and have qualified visitors to make personal or telephone visits. Contact the UOA for the chapter nearest you and for other educational publications.  More detailed information can be found in Colostomy Guide, a publication of the [employer] (UOA). Contact UOA at [phone])[phone] or visit their web site at [email or web address] The website contains links to other sites, suppliers and resources.  Tree surgeon Start Services: Start at the website to enlist for support.  Your Wound Ostomy (WOCN) nurse may have started this process. [email or web address]://[email or web address].[email or web address]/securestart Secure Start services are designed to support people as they live their lives with an ostomy or neurogenic bladder. Enrolling is easy and at no cost to the patient. We realize that each person's needs and life journey are different. Through Secure Start services, we want to help people live their life, their  way.  #######################################################    WOUND CARE  It is important that the wound be kept open.   -Keeping the skin edges apart will allow the wound to gradually heal from the base upwards.   - If the skin edges of the wound close too early, a new fluid pocket can form and infection can occur. -This is the reason to pack deeper wounds with gauze or ribbon -This is why drained wounds cannot be sewed closed right away  A healthy wound should form a lining of bright red "beefy" granulating tissue that will help shrink the wound and help the edges grow new skin into it.   -A little mucus / yellow discharge is normal (the body's natural way to try and form a scab) and should be gently washed off with soap and water with  daily dressing changes.  -Green or foul smelling drainage implies bacterial colonization and can slow wound healing - a short course of antibiotic ointment (3-5 days) can help it clear up.  Call the doctor if it does not improve or worsens  -Avoid use of antibiotic ointments for more than a week as they can slow wound healing over time.    -Sometimes other wound care products will be used to reduce need for dressing changes and/or help clean up dirty wounds -Sometimes the surgeon needs to debride the wound in the office to remove dead or infected tissue out of the wound so it can heal more quickly and safely.    Change the dressing at least once a day -Wash the wound with mild soap and water gently every day.  It is good to shower or bathe the wound to help it clean out. -Use clean 4x4 gauze for medium/large wounds or ribbon plain NU-gauze for smaller wounds (it does not need to be sterile, just clean) -Keep the raw wound moist with a little saline or KY (saline) gel on the gauze.  -A dry wound will take longer to heal.  -Keep the skin dry around the wound to prevent breakdown and irritation. -Pack the wound down to the base -The goal is to keep the  skin apart, not overpack the wound -Use a Q-tip or blunt-tipped kabob stick toothpick to push the gauze down to the base in narrow or deep wounds   -Cover with a clean gauze and tape -paper or Medipore tape tend to be gentle on the skin -rotate the orientation of the tape to avoid repeated stress/trauma on the skin -using an ACE or Coban wrap on wounds on arms or legs can be used instead.  Complete all antibiotics through the entire prescription to help the infection heal and prevent new places of infection   Returning the see the surgeon is helpful to follow the healing process and help the wound close as fast as possible.    SURGERY: POST OP INSTRUCTIONS (Surgery for small bowel obstruction, colon resection, etc)   ######################################################################  EAT Gradually transition to a high fiber diet with a fiber supplement over the next few days after discharge  WALK Walk an hour a day.  Control your pain to do that.    CONTROL PAIN Control pain so that you can walk, sleep, tolerate sneezing/coughing, go up/down stairs.  HAVE A BOWEL MOVEMENT DAILY Keep your bowels regular to avoid problems.  OK to try a laxative to override constipation.  OK to use an antidairrheal to slow down diarrhea.  Call if not better after 2 tries  CALL IF YOU HAVE PROBLEMS/CONCERNS Call if you are still struggling despite following these instructions. Call if you have concerns not answered by these instructions  ######################################################################   DIET Follow a light diet the first few days at home.  Start with a bland diet such as soups, liquids, starchy foods, low fat foods, etc.  If you feel full, bloated, or constipated, stay on a ful liquid or pureed/blenderized diet for a few days until you feel better and no longer constipated. Be sure to drink plenty of fluids every day to avoid getting dehydrated (feeling dizzy, not  urinating, etc.). Gradually add a fiber supplement to your diet over the next week.  Gradually get back to a regular solid diet.  Avoid fast food or heavy meals the first week as you are more likely to get nauseated. It is expected for  your digestive tract to need a few months to get back to normal.  It is common for your bowel movements and stools to be irregular.  You will have occasional bloating and cramping that should eventually fade away.  Until you are eating solid food normally, off all pain medications, and back to regular activities; your bowels will not be normal. Focus on eating a low-fat, high fiber diet the rest of your life (See Getting to [place], below).  CARE of your INCISION or WOUND  It is good for closed incisions and even open wounds to be washed every day.  Shower every day.  Short baths are fine.  Wash the incisions and wounds clean with soap & water.    You may leave closed incisions open to air if it is dry.   You may cover the incision with clean gauze & replace it after your daily shower for comfort.   See wound care instructions    ACTIVITIES as tolerated Start light daily activities --- self-care, walking, climbing stairs-- beginning the day after surgery.  Gradually increase activities as tolerated.  Control your pain to be active.  Stop when you are tired.  Ideally, walk several times a day, eventually an hour a day.   Most people are back to most day-to-day activities in a few weeks.  It takes 4-8 weeks to get back to unrestricted, intense activity. If you can walk 30 minutes without difficulty, it is safe to try more intense activity such as jogging, treadmill, bicycling, low-impact aerobics, swimming, etc. Save the most intensive and strenuous activity for last (Usually 4-8 weeks after surgery) such as sit-ups, heavy lifting, contact sports, etc.  Refrain from any intense heavy lifting or straining until you are off narcotics for pain control.  You  will have off days, but things should improve week-by-week. DO NOT PUSH THROUGH PAIN.  Let pain be your guide: If it hurts to do something, don't do it.  Pain is your body warning you to avoid that activity for another week until the pain goes down. You may drive when you are no longer taking narcotic prescription pain medication, you can comfortably wear a seatbelt, and you can safely make sudden turns/stops to protect yourself without hesitating due to pain. You may have sexual intercourse when it is comfortable. If it hurts to do something, stop.  MEDICATIONS Take your usually prescribed home medications unless otherwise directed.   Blood thinners:  Usually you can restart any strong blood thinners after the second postoperative day.  It is OK to take aspirin right away.     If you are on strong blood thinners (warfarin/Coumadin, Plavix, Xerelto, Eliquis, Pradaxa, etc), discuss with your surgeon, medicine PCP, and/or cardiologist for instructions on when to restart the blood thinner & if blood monitoring is needed (PT/INR blood check, etc).     PAIN CONTROL Pain after surgery or related to activity is often due to strain/injury to muscle, tendon, nerves and/or incisions.  This pain is usually short-term and will improve in a few months.  To help speed the process of healing and to get back to regular activity more quickly, DO THE FOLLOWING THINGS TOGETHER: Increase activity gradually.  DO NOT PUSH THROUGH PAIN Use Ice and/or Heat Try Gentle Massage and/or Stretching Take over the counter pain medication Take Narcotic prescription pain medication for more severe pain  Good pain control = faster recovery.  It is better to take more medicine to be more active  than to stay in bed all day to avoid medications.  Increase activity gradually Avoid heavy lifting at first, then increase to lifting as tolerated over the next 6 weeks. Do not "push through" the pain.  Listen to your body and avoid  positions and maneuvers than reproduce the pain.  Wait a few days before trying something more intense Walking an hour a day is encouraged to help your body recover faster and more safely.  Start slowly and stop when getting sore.  If you can walk 30 minutes without stopping or pain, you can try more intense activity (running, jogging, aerobics, cycling, swimming, treadmill, sex, sports, weightlifting, etc.) Remember: If it hurts to do it, then don't do it! Use Ice and/or Heat You will have swelling and bruising around the incisions.  This will take several weeks to resolve. Ice packs or heating pads (6-8 times a day, 30-60 minutes at a time) will help sooth soreness & bruising. Some people prefer to use ice alone, heat alone, or alternate between ice & heat.  Experiment and see what works best for you.  Consider trying ice for the first few days to help decrease swelling and bruising; then, switch to heat to help relax sore spots and speed recovery. Shower every day.  Short baths are fine.  It feels good!  Keep the incisions and wounds clean with soap & water.   Try Gentle Massage and/or Stretching Massage at the area of pain many times a day Stop if you feel pain - do not overdo it Take over the counter pain medication This helps the muscle and nerve tissues become less irritable and calm down faster Choose ONE of the following over-the-counter anti-inflammatory medications: Acetaminophen 500mg  tabs (Tylenol) 1-2 pills with every meal and just before bedtime (avoid if you have liver problems or if you have acetaminophen in you narcotic prescription) Naproxen 220mg  tabs (ex. Aleve, Naprosyn) 1-2 pills twice a day (avoid if you have kidney, stomach, IBD, or bleeding problems) Ibuprofen 200mg  tabs (ex. Advil, Motrin) 3-4 pills with every meal and just before bedtime (avoid if you have kidney, stomach, IBD, or bleeding problems) Take with food/snack several times a day as directed for at least 2 weeks  to help keep pain / soreness down & more manageable. Take Narcotic prescription pain medication for more severe pain A prescription for strong pain control is often given to you upon discharge (for example: oxycodone/Percocet, hydrocodone/Norco/Vicodin, or tramadol/Ultram) Take your pain medication as prescribed. Be mindful that most narcotic prescriptions contain Tylenol (acetaminophen) as well - avoid taking too much Tylenol. If you are having problems/concerns with the prescription medicine (does not control pain, nausea, vomiting, rash, itching, etc.), please call us [phone] to see if we need to switch you to a different pain medicine that will work better for you and/or control your side effects better. If you need a refill on your pain medication, you must call the office before 4 pm and on weekdays only.  By federal law, prescriptions for narcotics cannot be called into a pharmacy.  They must be filled out on paper & picked up from our office by the patient or authorized caretaker.  Prescriptions cannot be filled after 4 pm nor on weekends.    WHEN TO CALL us [phone] Severe uncontrolled or worsening pain  Fever over 101 F (38.5 C) Concerns with the incision: Worsening pain, redness, rash/hives, swelling, bleeding, or drainage Reactions / problems with new medications (itching, rash, hives, nausea, etc.)  Nausea and/or vomiting Difficulty urinating Difficulty breathing Worsening fatigue, dizziness, lightheadedness, blurred vision Other concerns If you are not getting better after two weeks or are noticing you are getting worse, contact our office [phone] for further advice.  We may need to adjust your medications, re-evaluate you in the office, send you to the emergency room, or see what other things we can do to help. The clinic staff is available to answer your questions during regular business hours (8:30am-5pm).  Please don't hesitate to call and ask to speak to one  of our nurses for clinical concerns.    A surgeon from [facility] is always on call at the hospitals 24 hours/day If you have a medical emergency, go to the nearest emergency room or call 911.  FOLLOW UP in our office One the day of your discharge from the hospital (or the next business weekday), please call [facility] to set up or confirm an appointment to see your surgeon in the office for a follow-up appointment.  Usually it is 2-3 weeks after your surgery.   If you have skin staples at your incision(s), let the office know so we can set up a time in the office for the nurse to remove them (usually around 10 days after surgery). Make sure that you call for appointments the day of discharge (or the next business weekday) from the hospital to ensure a convenient appointment time. IF YOU HAVE DISABILITY OR FAMILY LEAVE FORMS, BRING THEM TO THE OFFICE FOR PROCESSING.  DO NOT GIVE THEM TO YOUR DOCTOR.  [facility] [address] [place], [place], [place]  [address] ? [phone] - Main [phone] - [place],  [phone] - Fax [email or web address]    GETTING TO GOOD BOWEL HEALTH. It is expected for your digestive tract to need a few months to get back to normal.  It is common for your bowel movements and stools to be irregular.  You will have occasional bloating and cramping that should eventually fade away.  Until you are eating solid food normally, off all pain medications, and back to regular activities; your bowels will not be normal.   Avoiding constipation The goal: ONE SOFT BOWEL MOVEMENT A DAY!    Drink plenty of fluids.  Choose water first. TAKE A FIBER SUPPLEMENT EVERY DAY THE REST OF YOUR LIFE During your first week back home, gradually add back a fiber supplement every day Experiment which form you can tolerate.   There are many forms such as powders, tablets, wafers, gummies, etc Psyllium bran (Metamucil),  methylcellulose (Citrucel), Miralax or Glycolax, Benefiber, Flax Seed.  Adjust the dose week-by-week (1/2 dose/day to 6 doses a day) until you are moving your bowels 1-2 times a day.  Cut back the dose or try a different fiber product if it is giving you problems such as diarrhea or bloating. Sometimes a laxative is needed to help jump-start bowels if constipated until the fiber supplement can help regulate your bowels.  If you are tolerating eating & you are farting, it is okay to try a gentle laxative such as double dose MiraLax, prune juice, or Milk of Magnesia.  Avoid using laxatives too often. Stool softeners can sometimes help counteract the constipating effects of narcotic pain medicines.  It can also cause diarrhea, so avoid using for too long. If you are still constipated despite taking fiber daily, eating solids, and a few doses of laxatives, call our office. Controlling diarrhea Try  drinking liquids and eating bland foods for a few days to avoid stressing your intestines further. Avoid dairy products (especially milk & ice cream) for a short time.  The intestines often can lose the ability to digest lactose when stressed. Avoid foods that cause gassiness or bloating.  Typical foods include beans and other legumes, cabbage, broccoli, and dairy foods.  Avoid greasy, spicy, fast foods.  Every person has some sensitivity to other foods, so listen to your body and avoid those foods that trigger problems for you. Probiotics (such as active yogurt, Align, etc) may help repopulate the intestines and colon with normal bacteria and calm down a sensitive digestive tract Adding a fiber supplement gradually can help thicken stools by absorbing excess fluid and retrain the intestines to act more normally.  Slowly increase the dose over a few weeks.  Too much fiber too soon can backfire and cause cramping & bloating. It is okay to try and slow down diarrhea with a few doses of antidiarrheal medicines.    Bismuth subsalicylate (ex. Kayopectate, Pepto Bismol) for a few doses can help control diarrhea.  Avoid if pregnant.   Loperamide (Imodium) can slow down diarrhea.  Start with one tablet (2mg ) first.  Avoid if you are having fevers or severe pain.  ILEOSTOMY PATIENTS WILL HAVE CHRONIC DIARRHEA since their colon is not in use.    Drink plenty of liquids.  You will need to drink even more glasses of water/liquid a day to avoid getting dehydrated. Record output from your ileostomy.  Expect to empty the bag every 3-4 hours at first.  Most people with a permanent ileostomy empty their bag 4-6 times at the least.   Use antidiarrheal medicine (especially Imodium) several times a day to avoid getting dehydrated.  Start with a dose at bedtime & breakfast.  Adjust up or down as needed.  Increase antidiarrheal medications as directed to avoid emptying the bag more than 8 times a day (every 3 hours). Work with your wound ostomy nurse to learn care for your ostomy.  See ostomy care instructions. TROUBLESHOOTING IRREGULAR BOWELS 1) Start with a soft & bland diet. No spicy, greasy, or fried foods.  2) Avoid gluten/wheat or dairy products from diet to see if symptoms improve. 3) Miralax 17gm or flax seed mixed in [place]. water or juice-daily. May use 2-4 times a day as needed. 4) Gas-X, Phazyme, etc. as needed for gas & bloating.  5) Prilosec (omeprazole) over-the-counter as needed 6)  Consider probiotics (Align, Activa, etc) to help calm the bowels down  Call your doctor if you are getting worse or not getting better.  Sometimes further testing (cultures, endoscopy, X-ray studies, CT scans, bloodwork, etc.) may be needed to help diagnose and treat the cause of the diarrhea. [facility], [place], [place], [place], [place]  [address] [phone] - Main.    [phone]  - Toll Free.   [phone] - Fax [email or web address]

## 2022-10-18 NOTE — Progress Notes (Signed)
   10/18/22 1000  Mobility  Activity Ambulated with assistance in hallway  Level of Assistance Modified independent, requires aide device or extra time  Assistive Device Front wheel walker  Distance Ambulated (ft) 550 ft  Activity Response Tolerated well  Mobility Referral Yes  $Mobility charge 1 Mobility   Mobility Specialist Progress Note  Pt was in bed and agreeable. Had no c/o pain. Returned to bed w/ all needs met and call bell in reach.   Lucious Groves Mobility Specialist  Please contact via SecureChat or Rehab office at [phone]

## 2022-10-18 NOTE — Progress Notes (Signed)
Physical Therapy Treatment Patient Details Name: Timothy [name] MRN: [id number] DOB: [birth year] Today's Date: 10/18/2022   History of Present Illness 57 y/o male admitted for abdominal wall abscess. Around 9 yrs s/p ventral hernia repair with mesh. Now post-op exploratory laparotomy and drainage abdominal wall abscess 10/04/22 and repeat exploratory laparotomy with resection of colon 10/13/22.    PT Comments    Pt was received in supine and agreeable to session. Pt demonstrated improved activity tolerance this session. Pt able to demo safe stair trial with min guard for safety with step-to ascending and step-over-step descending without evidence of unsteadiness. Pt able to perform gait trial with improved upright posture and RW management. Pt continued to require use of bed rails for bed mobility despite cues to try without to simulate home environment, however pt reported feeling comfortable navigating bed mobility at home. Anticipate pt and family will be able to manage pt's mobility needs at home.    Recommendations for follow up therapy are one component of a multi-disciplinary discharge planning process, led by the attending physician.  Recommendations may be updated based on patient status, additional functional criteria and insurance authorization.  Follow Up Recommendations       Assistance Recommended at Discharge PRN  Patient can return home with the following A little help with walking and/or transfers;A little help with bathing/dressing/bathroom;Help with stairs or ramp for entrance;Assist for transportation   Equipment Recommendations  Rolling walker (2 wheels)    Recommendations for Other Services       Precautions / Restrictions Precautions Precautions: Fall Precaution Comments: PCA, colostomy, midline JP drain Restrictions Weight Bearing Restrictions: No     Mobility  Bed Mobility Overal bed mobility: Needs Assistance Bed Mobility: Supine to Sit, Sit to Supine      Supine to sit: Supervision, HOB elevated Sit to supine: Supervision, HOB elevated   General bed mobility comments: Pt requiring increased time and use of bedrails despite cues to try not to in order to simulate home environment    Transfers Overall transfer level: Needs assistance Equipment used: Rolling walker (2 wheels) Transfers: Sit to/from Stand Sit to Stand: Supervision           General transfer comment: Supervision for safety    Ambulation/Gait Ambulation/Gait assistance: Supervision Gait Distance (Feet): 300 Feet Assistive device: Rolling walker (2 wheels) Gait Pattern/deviations: Step-through pattern, Trunk flexed       General Gait Details: Pt demonstrating step-through pattern and flexed trunk due to abdomen pain, however had improved upright posture and proximity to RW.   Stairs Stairs: Yes Stairs assistance: Min guard Stair Management: One rail Right, Sideways Number of Stairs: 4 General stair comments: Pt able to demo safe stair trial with cues for technique and min guard for safety, but no evidence of unsteadiness.      Balance Overall balance assessment: Needs assistance Sitting-balance support: No upper extremity supported, Feet supported Sitting balance-Leahy Scale: Good Sitting balance - Comments: sitting EOB   Standing balance support: During functional activity, Bilateral upper extremity supported Standing balance-Leahy Scale: Fair Standing balance comment: with RW support                            Cognition Arousal/Alertness: Awake/alert Behavior During Therapy: WFL for tasks assessed/performed Overall Cognitive Status: Within Functional Limits for tasks assessed  Exercises      General Comments        Pertinent Vitals/Pain Pain Assessment Pain Assessment: 0-10 Pain Score: 6  Pain Location: abdomen Pain Descriptors / Indicators: Aching, Discomfort,  Guarding Pain Intervention(s): Limited activity within patient's tolerance, Monitored during session, Repositioned     PT Goals (current goals can now be found in the care plan section) Acute Rehab PT Goals Patient Stated Goal: to return to independent PT Goal Formulation: With patient Time For Goal Achievement: 10/29/22 Potential to Achieve Goals: Good Progress towards PT goals: Progressing toward goals    Frequency    Min 3X/week      PT Plan Current plan remains appropriate       AM-PAC PT "6 Clicks" Mobility   Outcome Measure  Help needed turning from your back to your side while in a flat bed without using bedrails?: A Little Help needed moving from lying on your back to sitting on the side of a flat bed without using bedrails?: A Little Help needed moving to and from a bed to a chair (including a wheelchair)?: A Little Help needed standing up from a chair using your arms (e.g., wheelchair or bedside chair)?: A Little Help needed to walk in hospital room?: A Little Help needed climbing 3-5 steps with a railing? : A Little 6 Click Score: 18    End of Session   Activity Tolerance: Patient tolerated treatment well Patient left: in bed;with call bell/phone within reach Nurse Communication: Mobility status PT Visit Diagnosis: Difficulty in walking, not elsewhere classified (R26.2);Pain     Time: [id number] PT Time Calculation (min) (ACUTE ONLY): 21 min  Charges:  $Gait Training: 8-22 mins                     Michelle Nasuti, PTA Acute Rehabilitation Services Secure Chat Preferred  [phone]    Michelle Nasuti 10/18/2022, 8:51 AM

## 2022-10-18 NOTE — Progress Notes (Signed)
Nutrition Follow-up  DOCUMENTATION CODES:   Non-severe (moderate) malnutrition in context of social or environmental circumstances  INTERVENTION:  - Discontinue Ensure Enlive po BID, each supplement provides 350 kcal and 20 grams of protein.  NUTRITION DIAGNOSIS:   Moderate Malnutrition related to social / environmental circumstances as evidenced by mild fat depletion, moderate muscle depletion.  GOAL:   Patient will meet greater than or equal to 90% of their needs - Met.   MONITOR:   Diet advancement, Labs, Weight trends, I & O's  REASON FOR ASSESSMENT:   Malnutrition Screening Tool    ASSESSMENT:   57 year old male who presented on 3/15 with abdominal wall abscess. PMH of GERD, anxiety, ETOH abuse, depression, HTN, bipolar disorder, hx colostomy and colostomy takedown.  Meds reviewed: cozaar. Labs reviewed: WDL.   The pt is currently on a Regular diet. He reports that he ate 100% of his breakfast this am. He states that he is leaving to go home tomorrow. Pt is tolerating his diet well. RD will continue to monitor PO. He states that he does not like Ensure, will discontinue.   Diet Order:   Diet Order             Diet regular Room service appropriate? Yes; Fluid consistency: Thin  Diet effective now           Diet - low sodium heart healthy                   EDUCATION NEEDS:   Education needs have been addressed  Skin:  Skin Assessment: Skin Integrity Issues: Skin Integrity Issues:: Incisions Incisions: closed abdomen  Last BM:  60 mL via ostomy  Height:   Ht Readings from Last 1 Encounters:  10/14/22 5\' 9"  (1.753 m)    Weight:   Wt Readings from Last 1 Encounters:  10/04/22 72.6 kg    Ideal Body Weight:     BMI:  Body mass index is 23.63 kg/m.  Estimated Nutritional Needs:   Kcal:  [id number] kcals  Protein:  110-130 grams  Fluid:  >/= 2.1 L  Thalia Bloodgood, RD, LDN, CNSC.

## 2022-10-18 NOTE — Progress Notes (Signed)
Timothy [name]:[id number] [birth year]  CARE TEAM:  PCP: Gerrit Heck, MD  Outpatient Care Team: Patient Care Team: Gerrit Heck, MD as PCP - General (Family Medicine) Jettie Booze, MD as PCP - Cardiology (Cardiology)  Inpatient Treatment Team: Treatment Team: Attending Provider: Jovita Kussmaul, MD; Pharmacist: Tyrone Apple, [facility]; Technician: Glenice Laine, NT; Case Manager: Robyne Askew, RN; Registered Nurse: Weyman Pedro, RN; Utilization Review: Rolland Porter, RN; Mobility Specialist: Curley Spice   Problem List:   Principal Problem:   Abdominal wall abscess Active Problems:   Malnutrition of moderate degree   Status post surgery   10/04/2022  POST-OPERATIVE DIAGNOSIS:  ABDOMINAL WALL ABSCESS   PROCEDURE:  Procedure(s): EXPLORATORY LAPAROTOMY (N/A) DRAINAGE ABDOMIAL WALL ABSCESS (N/A) REMOVAL OF MESH (N/A) SEGMENTAL COLON RESECTION (N/A) LYSIS OF ADHESION (N/A) USE OF INDOCYANINE GREEN IMAGING   SURGEON:  Surgeon(s) and Role:    Jovita Kussmaul, MD - Primary    Greer Pickerel, MD - Assisting  10/13/2022     Pre-op Diagnosis: INTRA ABDOMINAL INFECTION     Post-op Diagnosis: ANASTOMOTIC LEAK WITH INTRA-ABDOMINAL ABSCESS   Procedure(s): EXPLORATORY LAPAROTOMY RESECTION OF COLON ANASTOMOSIS   END COLOSCOPY   Surgeon(s): Kinsinger, Arta Bruce, MD Coralie Keens, MD  Assessment  Gradually recovering status post numerous debridements with removal of old hernia repair mesh and creation of end colostomy   [facility] Stay = 14 days)  Plan:  -Advance to regular diet Stop IV fluids DC telemetry DC drain Continue IV antibiotics.  Linezolid for concern of MRSA as well as piperacillin/tazobactam given colonic dehiscence.   -Colostomy care.  See if ostomy nurse can come by to perform some device and plug the patient into the outpatient ostomy clinic   Patient had a prior colostomy still feels pretty comfortable with  that. -Wound dressing care.  Wet-to-dry dressing changes.  May need to come back in 2 weeks for some retention suture removal.  Defer to the operating surgeon Dr. Marlou Starks -VTE prophylaxis- SCDs, etc -mobilize as tolerated to help recovery  If white count improved and feeling better with discharge planning, possibly discharge Saturday 3/30 with outpatient ostomy and wound care.  Switch to oral antibiotics.  Disposition: Needs home health for wound care and ostomy care.  I believe that is in the works      I reviewed Optometrist transition of care, physical therapy, pharmacy, nursing notes, hospitalist notes, last 24 h vitals and pain scores, last 48 h intake and output, last 24 h labs and trends, and last 24 h imaging results. I have reviewed this patient's available data, including medical history, events of note, test results, etc as part of my evaluation.  A significant portion of that time was spent in counseling.  Care during the described time interval was provided by me.  This care required moderate level of medical decision making.  10/18/2022    Subjective: (Chief complaint)  Feeling much better overall.  Not eating much but denies any nausea.  Pain better controlled.  Really wants to go home  Objective:  Vital signs:  Vitals:   10/17/22 2000 10/18/22 0232 10/18/22 0439 10/18/22 0756  BP: (!) 138/99 (!) 155/98 (!) 150/94 (!) 173/94  Pulse: 98 (!) 106 97 (!) 103  Resp: 19 18 17 16   Temp: 99.4 F (37.4 C) 98.7 F (37.1 C) 98.2 F (36.8 C) 97.8 F (36.6 C)  TempSrc:  Oral Oral Oral  SpO2: 96% 93% 93% 94%  Weight:      Height:        Last BM Date : 10/16/22  Intake/Output   Yesterday:  03/28 0701 - 03/29 0700 In: [id number] [P.O.:1877; I.V.:1250.5; IV Piggyback:603.5] Out: 5180 [Urine:5100; Drains:20; Stool:60] This shift:  No intake/output data recorded.  Bowel function:  Flatus: YES  BM:  YES  Drain: Serosanguinous   Physical Exam:  General: Pt  awake/alert in no acute distress.  Filing.  Moving with minimal guarding. Eyes: PERRL, normal EOM.  Sclera clear.  No icterus Neuro: CN II-XII intact w/o focal sensory/motor deficits. Lymph: No head/neck/groin lymphadenopathy Psych:  No delerium/psychosis/paranoia.  Oriented x 4 HENT: Normocephalic, Mucus membranes moist.  No thrush Neck: Supple, No tracheal deviation.  No obvious thyromegaly Chest: No pain to chest wall compression.  Good respiratory excursion.  No audible wheezing CV:  Pulses intact.  Regular rhythm.  No major extremity edema MS: Normal AROM mjr joints.  No obvious deformity  Abdomen: Soft.  Mildy distended.  Mildly tender at incisions only.  Midline wound with some mild subcu/fascial necrosis but mild no dehiscence.  Colostomy pink with stool and gas in bag.  Left lower quadrant drain with scant serosanguineous drainage.  Not purulent nor feculent.  No evidence of peritonitis.  No incarcerated hernias.  Ext:   No deformity.  No mjr edema.  No cyanosis Skin: No petechiae / purpurea.  No major sores.  Warm and dry    Results:   Cultures: Recent Results (from the past 720 hour(s))  Surgical pcr screen     Status: Abnormal   Collection Time: 10/01/22  9:09 AM   Specimen: Nasal Mucosa; Nasal Swab  Result Value Ref Range Status   MRSA, PCR NEGATIVE NEGATIVE Final   Staphylococcus aureus POSITIVE (A) NEGATIVE Final    Comment: (NOTE) The Xpert SA Assay (FDA approved for NASAL specimens in patients 71 years of age and older), is one component of a comprehensive surveillance program. It is not intended to diagnose infection nor to guide or monitor treatment. Performed at [facility], [place] [address], [place], [place] [address]   Aerobic/Anaerobic Culture w Gram Stain (surgical/deep wound)     Status: None   Collection Time: 10/04/22  8:11 AM   Specimen: PATH Other; Tissue  Result Value Ref Range Status   Specimen Description ABSCESS ABDOMEN  Final   Special  Requests ABD WALL PT ON ANCEF  Final   Gram Stain   Final    MODERATE WBC PRESENT,BOTH PMN AND MONONUCLEAR NO ORGANISMS SEEN    Culture   Final    RARE METHICILLIN RESISTANT STAPHYLOCOCCUS AUREUS Two isolates with different morphologies were identified as the same organism.The most resistant organism was reported. NO ANAEROBES ISOLATED Performed at [facility], [place] [address], [place], [place] [address]    Report Status 10/09/2022 FINAL  Final   Organism ID, Bacteria METHICILLIN RESISTANT STAPHYLOCOCCUS AUREUS  Final      Susceptibility   Methicillin resistant staphylococcus aureus - MIC*    CIPROFLOXACIN >=8 RESISTANT Resistant     ERYTHROMYCIN >=8 RESISTANT Resistant     GENTAMICIN <=0.5 SENSITIVE Sensitive     OXACILLIN >=4 RESISTANT Resistant     TETRACYCLINE <=1 SENSITIVE Sensitive     VANCOMYCIN 1 SENSITIVE Sensitive     TRIMETH/SULFA <=10 SENSITIVE Sensitive     CLINDAMYCIN <=0.25 SENSITIVE Sensitive     RIFAMPIN <=0.5 SENSITIVE Sensitive     Inducible Clindamycin NEGATIVE Sensitive     *  RARE METHICILLIN RESISTANT STAPHYLOCOCCUS AUREUS  MRSA Next Gen by PCR, Nasal     Status: None   Collection Time: 10/14/22 12:35 AM   Specimen: Nasal Mucosa; Nasal Swab  Result Value Ref Range Status   MRSA by PCR Next Gen NOT DETECTED NOT DETECTED Final    Comment: (NOTE) The GeneXpert MRSA Assay (FDA approved for NASAL specimens only), is one component of a comprehensive MRSA colonization surveillance program. It is not intended to diagnose MRSA infection nor to guide or monitor treatment for MRSA infections. Test performance is not FDA approved in patients less than 29 years old. Performed at [facility], [place] [address], [place], [place] [address]   Culture, blood (Routine X 2) w Reflex to ID Panel     Status: None (Preliminary result)   Collection Time: 10/14/22  2:05 AM   Specimen: BLOOD  Result Value Ref Range Status   Specimen Description BLOOD BLOOD LEFT HAND   Final   Special Requests   Final    BOTTLES DRAWN AEROBIC ONLY Blood Culture adequate volume   Culture   Final    NO GROWTH 4 DAYS Performed at [facility], [place] [address], [place], [place] [address]    Report Status PENDING  Incomplete  Culture, blood (Routine X 2) w Reflex to ID Panel     Status: None (Preliminary result)   Collection Time: 10/14/22  7:52 AM   Specimen: BLOOD RIGHT ARM  Result Value Ref Range Status   Specimen Description BLOOD RIGHT ARM  Final   Special Requests   Final    BOTTLES DRAWN AEROBIC AND ANAEROBIC Blood Culture adequate volume   Culture   Final    NO GROWTH 4 DAYS Performed at [facility], [place] [address] [place], [place] [address]    Report Status PENDING  Incomplete    Labs: Results for orders placed or performed during the hospital encounter of 10/04/22 (from the past 48 hour(s))  Glucose, capillary     Status: Abnormal   Collection Time: 10/16/22 11:47 AM  Result Value Ref Range   Glucose-Capillary 105 (H) 70 - 99 mg/dL    Comment: Glucose reference range applies only to samples taken after fasting for at least 8 hours.  Glucose, capillary     Status: Abnormal   Collection Time: 10/16/22  3:00 PM  Result Value Ref Range   Glucose-Capillary 102 (H) 70 - 99 mg/dL    Comment: Glucose reference range applies only to samples taken after fasting for at least 8 hours.  CBC     Status: Abnormal   Collection Time: 10/17/22  1:48 PM  Result Value Ref Range   WBC 13.4 (H) 4.0 - 10.5 K/uL   RBC 2.98 (L) 4.22 - 5.81 MIL/uL   Hemoglobin 9.2 (L) 13.0 - 17.0 g/dL   HCT 28.4 (L) 39.0 - 52.0 %   MCV 95.3 80.0 - 100.0 fL   MCH 30.9 26.0 - 34.0 pg   MCHC 32.4 30.0 - 36.0 g/dL   RDW 14.6 11.5 - 15.5 %   Platelets 888 (H) 150 - 400 K/uL   nRBC 0.0 0.0 - 0.2 %    Comment: Performed at [facility] [address], [place], [place] [address]   *Note: Due to a large number of results and/or encounters for the requested time period, some  results have not been displayed. A complete set of results can be found in Results Review.  Imaging / Studies: No results found.  Medications / Allergies: per chart  Antibiotics: Anti-infectives (From admission, onward)    Start     Dose/Rate Route Frequency Ordered Stop   10/17/22 1645  piperacillin-tazobactam (ZOSYN) IVPB 3.375 g        3.375 g 12.5 mL/hr over 240 Minutes Intravenous Every 8 hours 10/17/22 1553     10/14/22 1445  linezolid (ZYVOX) IVPB 600 mg        600 mg 300 mL/hr over 60 Minutes Intravenous Every 12 hours 10/14/22 1354     10/14/22 0927  vancomycin variable dose per unstable renal function (pharmacist dosing)  Status:  Discontinued         Does not apply See admin instructions 10/14/22 0928 10/14/22 1354   10/10/22 0800  piperacillin-tazobactam (ZOSYN) IVPB 3.375 g        3.375 g 12.5 mL/hr over 240 Minutes Intravenous Every 8 hours 10/10/22 0728 10/15/22 0759   10/09/22 0045  vancomycin (VANCOCIN) IVPB 1000 mg/200 mL premix  Status:  Discontinued        1,000 mg 200 mL/hr over 60 Minutes Intravenous Every 12 hours 10/08/22 1158 10/14/22 0928   10/08/22 1245  vancomycin (VANCOREADY) IVPB 1250 mg/250 mL        1,250 mg 166.7 mL/hr over 90 Minutes Intravenous  Once 10/08/22 1158 10/08/22 1639   10/04/22 1700  piperacillin-tazobactam (ZOSYN) IVPB 3.375 g        3.375 g 12.5 mL/hr over 240 Minutes Intravenous Every 8 hours 10/04/22 1613 10/09/22 1416   10/04/22 0615  ceFAZolin (ANCEF) IVPB 2g/100 mL premix        2 g 200 mL/hr over 30 Minutes Intravenous On call to O.R. 10/04/22 0601 10/04/22 1131         Note: Portions of this report may have been transcribed using voice recognition software. Every effort was made to ensure accuracy; however, inadvertent computerized transcription errors may be present.   Any transcriptional errors that result from this process are unintentional.    Adin Hector, MD, FACS, MASCRS Esophageal, Gastrointestinal &  Colorectal Surgery Robotic and Minimally Invasive Surgery  [facility]. [address], [place], [place] [address] [phone] Fax [phone] Main  CONTACT INFORMATION:  Weekday (9AM-5PM): Call [facility] main office at [phone]  Weeknight (5PM-9AM) or Weekend/Holiday: Check [email or web address] (password " [email or web address]") for General Surgery [facility] coverage  (Please, do not use SecureChat as it is not reliable communication to reach operating surgeons for immediate patient care given surgeries/outpatient duties/clinic/cross-coverage/off post-call which would lead to a delay in care.  Epic staff messaging available for outptient concerns, but may not be answered for 48 hours or more).     10/18/2022  9:29 AM

## 2022-10-19 DIAGNOSIS — E876 Hypokalemia: Secondary | ICD-10-CM | POA: Insufficient documentation

## 2022-10-19 LAB — CULTURE, BLOOD (ROUTINE X 2)
Culture: NO GROWTH
Culture: NO GROWTH
Special Requests: ADEQUATE
Special Requests: ADEQUATE

## 2022-10-19 LAB — CBC
HCT: 26 % — ABNORMAL LOW (ref 39.0–52.0)
Hemoglobin: 8.3 g/dL — ABNORMAL LOW (ref 13.0–17.0)
MCH: 30 pg (ref 26.0–34.0)
MCHC: 31.9 g/dL (ref 30.0–36.0)
MCV: 93.9 fL (ref 80.0–100.0)
Platelets: 847 10*3/uL — ABNORMAL HIGH (ref 150–400)
RBC: 2.77 MIL/uL — ABNORMAL LOW (ref 4.22–5.81)
RDW: 14.9 % (ref 11.5–15.5)
WBC: 10.1 10*3/uL (ref 4.0–10.5)
nRBC: 0 % (ref 0.0–0.2)

## 2022-10-19 LAB — CREATININE, SERUM
Creatinine, Ser: 0.86 mg/dL (ref 0.61–1.24)
GFR, Estimated: >60 mL/min (ref >60)

## 2022-10-19 LAB — POTASSIUM: Potassium: 2.5 mmol/L — CL (ref 3.5–5.1)

## 2022-10-19 MED ORDER — METHOCARBAMOL 500 MG PO TABS: 500.0000 mg | ORAL_TABLET | Freq: Three times a day (TID) | ORAL | 2 refills | Status: DC | PRN

## 2022-10-19 MED ORDER — POTASSIUM CHLORIDE CRYS ER 20 MEQ PO TBCR
40.0000 meq | EXTENDED_RELEASE_TABLET | Freq: Two times a day (BID) | ORAL | Status: DC
  Administered 2022-10-19: 40 meq via ORAL
  Filled 2022-10-19: qty 2

## 2022-10-19 MED ORDER — POTASSIUM CHLORIDE 10 MEQ/100ML IV SOLN
10.0000 meq | INTRAVENOUS | Status: AC
  Administered 2022-10-19 (×4): 10 meq via INTRAVENOUS
  Filled 2022-10-19 (×4): qty 100

## 2022-10-19 MED ORDER — POTASSIUM CHLORIDE CRYS ER 10 MEQ PO TBCR: 40.0000 meq | EXTENDED_RELEASE_TABLET | Freq: Every day | ORAL | 0 refills | Status: DC

## 2022-10-19 MED ORDER — OXYCODONE HCL 5 MG PO TABS: 5.0000 mg | ORAL_TABLET | Freq: Four times a day (QID) | ORAL | 0 refills | Status: DC | PRN

## 2022-10-19 NOTE — Progress Notes (Addendum)
AVS given and reviewed with pt. Medications discussed. All questions answered to satisfaction. Pt verbalized understanding of information given. Pt escorted off the unit with all belongings via wheelchair by staff member.  Pt verbalizes understanding of abdominal dressing changes and ostomy care stating he is able to complete these himself. Pt states he has enough ostomy supplies. RN provided supplies for abdominal dressing change.

## 2022-10-19 NOTE — Discharge Summary (Signed)
Physician Discharge Summary    Patient ID: Timothy [name] MRN: [id number] DOB/AGE: Feb 16, 1966  57 y.o.  Patient Care Team: Gerrit Heck, MD as PCP - General (Family Medicine) Timothy Booze, MD as PCP - Cardiology (Cardiology)  Admit date: 10/04/2022  Discharge date: 10/19/2022  Hospital Stay = 15 days    Discharge Diagnoses:  Principal Problem:   Abdominal wall abscess Active Problems:   Tobacco use   Panic anxiety syndrome   Chronic abdominal wound infection   Malnutrition of moderate degree   Status post surgery   Colostomy in place [facility])   Hypokalemia   10/04/2022   POST-OPERATIVE DIAGNOSIS:  ABDOMINAL WALL ABSCESS   PROCEDURE:  Procedure(s): EXPLORATORY LAPAROTOMY (N/A) DRAINAGE ABDOMIAL WALL ABSCESS (N/A) REMOVAL OF MESH (N/A) SEGMENTAL COLON RESECTION (N/A) LYSIS OF ADHESION (N/A) USE OF INDOCYANINE GREEN IMAGING   SURGEON:  Surgeon(s) and Role:    Jovita Kussmaul, MD - Primary    Greer Pickerel, MD - Assisting   10/13/2022       Pre-op Diagnosis: INTRA ABDOMINAL INFECTION     Post-op Diagnosis: ANASTOMOTIC LEAK WITH INTRA-ABDOMINAL ABSCESS   Procedure(s): EXPLORATORY LAPAROTOMY RESECTION OF COLON ANASTOMOSIS   END COLOSCOPY   Surgeon(s): Kinsinger, Arta Bruce, MD Coralie Keens, MD   Consults: Case Management / Social Work, Physical Therapy, Pharmacy, Nutrition, Wound ostomy consult nurse (WOCN), and Anesthesia  Hospital Course:   Patient with numerous abdominal surgeries including Hartmann resection with colostomy takedown incisional hernia incisional hernia repair.  Complicated with recurrent abdominal wall abscesses and mesh infection.  After discussion with Dr. Marlou Starks underwent operative exploration with removal of mesh.  Colon had to be resected as well.  Patient initially did well and then developed fever was found to have a delayed leak requiring reoperation as noted above.  Patient had postoperative ileus that eventually  opened up and resolved.  Patient was placed on appropriate antibiotics.  Some question of MRSA in the wound but not classically colonized.  There will double coverage with initially vancomycin transition to linezolid.  Stayed on Zosyn.  Leukocytosis resolved.  Colostomy functioning well.  Wound being packed.  Patient is used to chronic wounds and colostomy in the past and felt comfortable managing this with his son.  Wound ostomy nursing consultation made with hopes for home health and outpatient follow-up.  By the time of discharge, the patient was walking well the hallways, eating food, having flatus.  Pain was well-controlled on an oral medications.  Patient did have significant hypokalemia requiring IV replacement and will receive 5 more days of oral replacement.  Based on meeting discharge criteria and continuing to recover, I felt it was safe for the patient to be discharged from the hospital to further recover with close followup. Postoperative recommendations were discussed in detail.  They are written as well.  Discharged Condition: good  Discharge Exam: Blood pressure (!) 141/88, pulse 98, temperature 98 F (36.7 C), temperature source Oral, resp. rate 16, height 5\' 9"  (1.753 m), weight 72.6 kg, SpO2 92 %.  General: Pt awake/alert/oriented x4 in No acute distress.  Bright and alert.  Animated.  Moving around without any difficulty nor hesitancy.  Walking hallways quite well. Eyes: PERRL, normal EOM.  Sclera clear.  No icterus Neuro: CN II-XII intact w/o focal sensory/motor deficits. Lymph: No head/neck/groin lymphadenopathy Psych:  No delerium/psychosis/paranoia HENT: Normocephalic, Mucus membranes moist.  No thrush Neck: Supple, No tracheal deviation Chest: No chest wall pain w good excursion CV:  Pulses intact.  Regular rhythm MS: Normal AROM mjr joints.  No obvious deformity  Abdomen: Soft.  Nondistended.  Mildly tender at incisions only.  No evidence of peritonitis.  No  incarcerated hernias. Midline incisional wound shallow with some mild central necrosis but peripheral granulation.  Retention sutures in place without dehiscence.  Left-sided colostomy pink with stool and flatus in bag.  Ext:  SCDs BLE.  No mjr edema.  No cyanosis Skin: No petechiae / purpura   Disposition:    Follow-up Information     Timothy [name], Timothy [name] [facility] Follow up.   Contact information: [place] [address] [place]. Schedule an appointment as soon as possible for a visit in 1 day(s).   Specialty: General Surgery Why: TO GET OSTOMY CARE / TRAINING AT [place] information: [address] [id number] Timothy [name] [place]        Timothy Messing III, MD. Schedule an appointment as soon as possible for a visit in 2 week(s).   Specialty: General Surgery Why: To follow up after your hospital stay, To have your wound re-checked Contact information: [place] [address] [phone]                 Discharge disposition: 01-Home or Self Care       Discharge Instructions     Amb Referral to Ostomy Clinic   Complete by: As directed    Reason for referral modifiers: Pre and post-operative counseling for ostomy management   Amb Referral to Ostomy Clinic   Complete by: As directed    Reason for referral modifiers: Pre and post-operative counseling for ostomy management   Call MD for:   Complete by: As directed    FEVER > 101.5 F (Temperatures <101.63F can occasionally happen and are not significant)   Call MD for:   Complete by: As directed    FEVER > 101.5 F (Temperatures <101.63F can occasionally happen and are not significant)   Call MD for:  extreme fatigue   Complete by: As directed    Call MD for:  extreme fatigue   Complete by: As directed    Call MD for:  persistant dizziness or  light-headedness   Complete by: As directed    Call MD for:  persistant dizziness or light-headedness   Complete by: As directed    Call MD for:  persistant nausea and vomiting   Complete by: As directed    Call MD for:  persistant nausea and vomiting   Complete by: As directed    Call MD for:  redness, tenderness, or signs of infection (pain, swelling, redness, odor or green/yellow discharge around incision site)   Complete by: As directed    Call MD for:  redness, tenderness, or signs of infection (pain, swelling, redness, odor or green/yellow discharge around incision site)   Complete by: As directed    Call MD for:  severe uncontrolled pain   Complete by: As directed    Call MD for:  severe uncontrolled pain   Complete by: As directed    Diet - low sodium heart healthy   Complete by: As directed    Follow a light diet the first few days at home.    If you feel full, bloated, or constipated, stay on a liquid diet until you feel better and not constipated. Gradually get back to  a solid diet.  Avoid fast food or heavy meals the first week as you are more likely to get nauseated. It is expected for your digestive tract to need a few months to get back to normal.   Discharge wound care:   Complete by: As directed    You have an open wound. If you have an open wound with a wound vac, see wound vac care instructions. If the wound is being packed, see wound care instructions.    In general, it is encouraged that you remove your dressing and packing, shower with soap & water, and replace your dressing once a day.   Pack the wound with clean gauze moistened with normal (0.9%) saline or KY gel to keep the wound moist & uninfected.    .   Eventually your body will heal & pull the open wound closed over the next few months.  Raw open wounds will occasionally bleed or secrete yellow drainage until it heals closed.   Pressure on the dressing for 30 minutes will stop most wound bleeding Drain  sites will drain a little until the drain is removed.   Discharge wound care:   Complete by: As directed    You have an open wound being packed =  see wound care instructions.    In general, it is encouraged that you remove your dressing and packing, shower with soap & water, and replace your dressing once a day.   Pack the wound with clean gauze moistened with normal (0.9%) saline or KY gel to keep the wound moist & uninfected.    .   Eventually your body will heal & pull the open wound closed over the next few months.  Raw open wounds will occasionally bleed or secrete yellow drainage until it heals closed.   Pressure on the dressing for 30 minutes will stop most wound bleeding Drain sites will drain a little until the drain is removed.   Driving Restrictions   Complete by: As directed    You may drive when you are no longer taking narcotic prescription pain medication, you can comfortably wear a seatbelt, and you can safely make sudden turns/stops to protect yourself without hesitating due to pain.   Driving Restrictions   Complete by: As directed    You may drive when you are no longer taking narcotic prescription pain medication, you can comfortably wear a seatbelt, and you can safely make sudden turns/stops to protect yourself without hesitating due to pain.   Increase activity slowly   Complete by: As directed    Increase activity slowly   Complete by: As directed    Lifting restrictions   Complete by: As directed    Start light daily activities --- self-care, walking, climbing stairs- beginning the day after surgery.   Gradually increase activities as tolerated.   Control your pain to be active.   Stop when you are tired.   Ideally, walk several times a day, eventually an hour a day.   Most people are back to most day-to-day activities in a few weeks.  It takes 4-8 weeks to get back to unrestricted, intense activity. If you can walk 30 minutes without difficulty, it is safe to  try more intense activity such as jogging, treadmill, bicycling, low-impact aerobics, swimming, etc. Save the most intensive and strenuous activity for last (Usually 4-8 weeks after surgery) such as sit-ups, heavy lifting, contact sports, etc.   Refrain from any intense heavy lifting or straining until you  are off narcotics for pain control.  You will have off days, but things should improve week-by-week. DO NOT PUSH THROUGH PAIN.   Let pain be your guide: If it hurts to do something, don't do it.  Pain is your body warning you to avoid that activity for another week until the pain goes down.   Lifting restrictions   Complete by: As directed    Start light daily activities --- self-care, walking, climbing stairs- beginning the day after surgery.   Gradually increase activities as tolerated.   Control your pain to be active.   Stop when you are tired.   Ideally, walk several times a day, eventually an hour a day.   Most people are back to most day-to-day activities in a few weeks.  It takes 4-8 weeks to get back to unrestricted, intense activity. If you can walk 30 minutes without difficulty, it is safe to try more intense activity such as jogging, treadmill, bicycling, low-impact aerobics, swimming, etc. Save the most intensive and strenuous activity for last (Usually 4-8 weeks after surgery) such as sit-ups, heavy lifting, contact sports, etc.   Refrain from any intense heavy lifting or straining until you are off narcotics for pain control.  You will have off days, but things should improve week-by-week. DO NOT PUSH THROUGH PAIN.   Let pain be your guide: If it hurts to do something, don't do it.  Pain is your body warning you to avoid that activity for another week until the pain goes down.   May shower / Bathe   Complete by: As directed    May shower / Bathe   Complete by: As directed    May walk up steps   Complete by: As directed    May walk up steps   Complete by: As directed     Sexual Activity Restrictions   Complete by: As directed    You may have sexual intercourse when it is comfortable. If it hurts to do something, stop.   Sexual Activity Restrictions   Complete by: As directed    You may have sexual intercourse when it is comfortable. If it hurts to do something, stop.       Allergies as of 10/19/2022       Reactions   Ambien [zolpidem Tartrate] Other (See Comments)   Caused agitation of mood.    Bactroban [mupirocin Calcium] Swelling   Caused infection at a surgical site   Zestril [lisinopril] Cough   Propoxyphene Itching   Morphine And Related Itching        Medication List     STOP taking these medications    cyclobenzaprine 10 MG tablet Commonly known as: FLEXERIL       TAKE these medications    albuterol (2.5 MG/3ML) 0.083% nebulizer solution Commonly known as: PROVENTIL Take 3 mLs (2.5 mg total) by nebulization every 6 (six) hours as needed for wheezing or shortness of breath. What changed: Another medication with the same name was removed. Continue taking this medication, and follow the directions you see here.   Anoro Ellipta 62.5-25 MCG/ACT Aepb Generic drug: umeclidinium-vilanterol Inhale 1 puff into the lungs daily.   benzonatate 100 MG capsule Commonly known as: Tessalon Perles Take 1 capsule (100 mg total) by mouth 3 (three) times daily as needed for cough.   busPIRone 15 MG tablet Commonly known as: BUSPAR TAKE 1 TABLET BY MOUTH IN THE MORNING.   gabapentin 100 MG capsule Commonly known as: NEURONTIN TAKE 1  CAPSULE BY MOUTH FOUR TIMES DAILY AS NEEDED What changed:  how much to take how to take this when to take this reasons to take this additional instructions   losartan 100 MG tablet Commonly known as: COZAAR TAKE 1 TABLET (100 MG TOTAL) BY MOUTH DAILY.   methocarbamol 500 MG tablet Commonly known as: ROBAXIN Take 1 tablet (500 mg total) by mouth every 8 (eight) hours as needed for muscle spasms.    ondansetron 4 MG disintegrating tablet Commonly known as: ZOFRAN-ODT Take 1 tablet (4 mg total) by mouth every 8 (eight) hours as needed for nausea or vomiting.   oxyCODONE 5 MG immediate release tablet Commonly known as: Oxy IR/ROXICODONE Take 1 tablet (5 mg total) by mouth every 6 (six) hours as needed for moderate pain.   pantoprazole 40 MG tablet Commonly known as: PROTONIX TAKE 1 TABLET BY MOUTH DAILY   potassium chloride 10 MEQ tablet Commonly known as: KLOR-CON M Take 4 tablets (40 mEq total) by mouth daily for 5 days.   sertraline 100 MG tablet Commonly known as: ZOLOFT TAKE 1 TABLET (100 MG TOTAL) BY MOUTH DAILY.   varenicline 0.5 MG tablet Commonly known as: CHANTIX Take 1 tablet (0.5 mg total) by mouth 2 (two) times daily with a meal. Once daily for the first week then twice daily               Discharge Care Instructions  (From admission, onward)           Start     Ordered   10/19/22 0000  Discharge wound care:       Comments: You have an open wound being packed =  see wound care instructions.    In general, it is encouraged that you remove your dressing and packing, shower with soap & water, and replace your dressing once a day.   Pack the wound with clean gauze moistened with normal (0.9%) saline or KY gel to keep the wound moist & uninfected.    .   Eventually your body will heal & pull the open wound closed over the next few months.  Raw open wounds will occasionally bleed or secrete yellow drainage until it heals closed.   Pressure on the dressing for 30 minutes will stop most wound bleeding Drain sites will drain a little until the drain is removed.   10/19/22 1152   10/18/22 0000  Discharge wound care:       Comments: You have an open wound. If you have an open wound with a wound vac, see wound vac care instructions. If the wound is being packed, see wound care instructions.    In general, it is encouraged that you remove your dressing and  packing, shower with soap & water, and replace your dressing once a day.   Pack the wound with clean gauze moistened with normal (0.9%) saline or KY gel to keep the wound moist & uninfected.    .   Eventually your body will heal & pull the open wound closed over the next few months.  Raw open wounds will occasionally bleed or secrete yellow drainage until it heals closed.   Pressure on the dressing for 30 minutes will stop most wound bleeding Drain sites will drain a little until the drain is removed.   10/18/22 1130            Significant Diagnostic Studies:  Results for orders placed or performed during the hospital encounter of 10/04/22 (from the  past 72 hour(s))  Glucose, capillary     Status: Abnormal   Collection Time: 10/16/22  3:00 PM  Result Value Ref Range   Glucose-Capillary 102 (H) 70 - 99 mg/dL    Comment: Glucose reference range applies only to samples taken after fasting for at least 8 hours.  CBC     Status: Abnormal   Collection Time: 10/17/22  1:48 PM  Result Value Ref Range   WBC 13.4 (H) 4.0 - 10.5 K/uL   RBC 2.98 (L) 4.22 - 5.81 MIL/uL   Hemoglobin 9.2 (L) 13.0 - 17.0 g/dL   HCT 28.4 (L) 39.0 - 52.0 %   MCV 95.3 80.0 - 100.0 fL   MCH 30.9 26.0 - 34.0 pg   MCHC 32.4 30.0 - 36.0 g/dL   RDW 14.6 11.5 - 15.5 %   Platelets 888 (H) 150 - 400 K/uL   nRBC 0.0 0.0 - 0.2 %    Comment: Performed at [facility] [address], [place], [place] [address]  CBC     Status: Abnormal   Collection Time: 10/19/22  3:26 AM  Result Value Ref Range   WBC 10.1 4.0 - 10.5 K/uL   RBC 2.77 (L) 4.22 - 5.81 MIL/uL   Hemoglobin 8.3 (L) 13.0 - 17.0 g/dL   HCT 26.0 (L) 39.0 - 52.0 %   MCV 93.9 80.0 - 100.0 fL   MCH 30.0 26.0 - 34.0 pg   MCHC 31.9 30.0 - 36.0 g/dL   RDW 14.9 11.5 - 15.5 %   Platelets 847 (H) 150 - 400 K/uL   nRBC 0.0 0.0 - 0.2 %    Comment: Performed at [facility], [place] [address], [place], [place] [address]  Potassium     Status: Abnormal    Collection Time: 10/19/22  3:26 AM  Result Value Ref Range   Potassium 2.5 (LL) 3.5 - 5.1 mmol/L    Comment: CRITICAL RESULT CALLED TO, READ BACK BY AND VERIFIED WITH Debby Freiberg RN 10/19/22 0433 Wiliam Ke Performed at [facility], [place] [address], [place], [place] [address]   Creatinine, serum     Status: None   Collection Time: 10/19/22  3:26 AM  Result Value Ref Range   Creatinine, Ser 0.86 0.61 - 1.24 mg/dL   GFR, Estimated >60 >60 mL/min    Comment: (NOTE) Calculated using the CKD-EPI Creatinine Equation (2021) Performed at [facility] [address], [place], [place] [address]    *Note: Due to a large number of results and/or encounters for the requested time period, some results have not been displayed. A complete set of results can be found in Results Review.    DG Abd 1 View  Result Date: 10/05/2022 CLINICAL DATA:  Encounter for nasogastric tube placement. EXAM: ABDOMEN - 1 VIEW COMPARISON:  Radiograph yesterday FINDINGS: The tip of the enteric tube is below the diaphragm in the stomach, the side port remains in the region of the distal esophagus. Advancement of at least 5 cm is recommended for optimal placement. Decreased gaseous gastric distension. Small volume of free air in the abdomen, previously reported as postsurgical. IMPRESSION: The tip of the enteric tube is below the diaphragm in the stomach, the side port remains in the region of the distal esophagus. Advancement of at least 5 cm is recommended for optimal placement. Electronically Signed   By: Keith Rake M.D.   On: 10/05/2022 16:41   DG Abd 1 View  Result  Date: 10/04/2022 CLINICAL DATA:  Enteric tube placement. Status post exploratory laparotomy. EXAM: ABDOMEN - 1 VIEW COMPARISON:  CT abdomen pelvis dated August 14, 2022. FINDINGS: Enteric tube tip in the distal esophagus. Distended stomach. Small volume pneumoperitoneum. Clear lung bases. IMPRESSION: 1. Enteric tube tip in the distal esophagus.  Recommend advancement. 2. Postsurgical small volume pneumoperitoneum. Electronically Signed   By: Titus Dubin M.D.   On: 10/04/2022 12:09    Past Medical History:  Diagnosis Date   Acute bronchitis 02/15/2013   Alcohol abuse    Anxiety    Bipolar disorder ([place])    Bronchitis    history   Bronchitis    Cellulitis    - left knee-2009   Chronic pain    Complication of anesthesia    Condyloma 02/04/2012   Depression    Diverticulosis    By colonoscopy June 2005   Dyspnea    on exertion at times   Elevated CK 09/2011   Emphysema lung ([place])    Emphysema of lung ([employer])    Family history of anesthesia complication    Mother N/V   GERD (gastroesophageal reflux disease)    Glaucoma syndrome    does not use eye drops, "They burn".   Headache(784.0)    "alot; not daily" (07/23/2012)   Heavy smoker    Hemorrhoids    History of colostomy 03/21/2012   Left colectomy and anastomosis performed by Timothy Messing, MD on 02/18/13 for recurrent diverticulitis Exploratory laparotomy preformed 02/27/12 for anastomotic leak and Hartman pouch/colostomy peformed Colostomy taken down January 2014   History of diverticulitis of colon 10/16/2011   History of dizziness    History of stomach ulcers 2005   HPV (human papilloma virus) infection    Hypertension    started 3 months ago   Hypoglycemia    Incisional hernia    Lower GI bleed    June 2005. Presumably secondary to diverticulosis.   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   Ventral hernia 01/11/2013   Repaired with mesh 05/20/13   Wrist pain 01/26/2019    Past Surgical History:  Procedure Laterality Date   ABDOMINAL EXPLORATION SURGERY  07/23/2012   w/LOA (07/23/2012)   [place] RESECTION  02/27/2012   Procedure: COLON RESECTION;  Surgeon: Merrie Roof, MD;  Location: [facility];  Service: General;  Laterality: N/A;   COLON RESECTION N/A 10/04/2022    Procedure: SEGMENTAL COLON RESECTION;  Surgeon: Jovita Kussmaul, MD;  Location: [facility];  Service: General;  Laterality: N/A;   COLONOSCOPY  10/13/2022   Procedure: RESECTION OF COLON ANASTOMOSIS AND END COLOSCOPY;  Surgeon: Mickeal Skinner, MD;  Location: [place];  Service: General;;   COLOSTOMY  02/27/2012   Procedure: COLOSTOMY;  Surgeon: Merrie Roof, MD;  Location: [facility];  Service: General;  Laterality: N/A;   COLOSTOMY TAKEDOWN  07/23/2012   COLOSTOMY TAKEDOWN  07/23/2012   Procedure: COLOSTOMY TAKEDOWN;  Surgeon: Merrie Roof, MD;  Location: [facility];  Service: General;  Laterality: N/A;  Primary Anastomosis   ELBOW FRACTURE SURGERY  ~ 1975   left;  pins inserted    EXCISION OF MESH N/A 10/04/2022   Procedure: REMOVAL OF MESH;  Surgeon: Jovita Kussmaul, MD;  Location: [facility];  Service: General;  Laterality: N/A;   INCISION AND DRAINAGE ABSCESS N/A 04/05/2021   Procedure: INCISION AND DRAINAGE  ABDOMINAL WALL ABSCESS;  Surgeon: Jovita Kussmaul, MD;  Location: [facility];  Service: General;  Laterality: N/A;   INSERTION OF MESH N/A 05/20/2013   Procedure: INSERTION OF MESH;  Surgeon: Merrie Roof, MD;  Location: [facility];  Service: General;  Laterality: N/A;   IRRIGATION AND DEBRIDEMENT ABSCESS N/A 10/04/2022   Procedure: DRAINAGE ABDOMIAL WALL ABSCESS;  Surgeon: Jovita Kussmaul, MD;  [facility];  Service: General;  Laterality: N/A;   LAPAROSCOPIC LEFT COLON RESECTION  02/19/2012   SIGMOID   LAPAROTOMY  02/27/2012   Procedure: EXPLORATORY LAPAROTOMY;  Surgeon: Merrie Roof, MD;  Location: [facility];  Service: General;  Laterality: N/A;   LAPAROTOMY  07/23/2012   Procedure: EXPLORATORY LAPAROTOMY;  Surgeon: Merrie Roof, MD;  Location: [facility];  Service: General;  Laterality: N/A;   LAPAROTOMY N/A 10/04/2022   Procedure: EXPLORATORY LAPAROTOMY;  Surgeon: Jovita Kussmaul, MD;  Location: [facility] OR;  Service: General;  Laterality: N/A;   LAPAROTOMY N/A 10/13/2022   Procedure: EXPLORATORY LAPAROTOMY;  Surgeon:  Mickeal Skinner, MD;  Location: [place] [facility];  Service: General;  Laterality: N/A;   LESION DESTRUCTION  07/23/2012   Procedure: DESTRUCTION LESION ANUS;  Surgeon: Merrie Roof, MD;  Location: [place];  Service: General;  Laterality: N/A;  DESTRUCTION ANAL CONDYLOMA   LYSIS OF ADHESION  07/23/2012   Procedure: LYSIS OF ADHESION;  Surgeon: Merrie Roof, MD;  Location: [facility];  Service: General;  Laterality: N/A;   LYSIS OF ADHESION N/A 10/04/2022   Procedure: LYSIS OF ADHESION;  Surgeon: Jovita Kussmaul, MD;  Location: [facility];  Service: General;  Laterality: N/A;   VENTRAL HERNIA REPAIR  05/20/2013   Dr Marlon Pel HERNIA REPAIR N/A 05/20/2013   Procedure: VENTRAL HERNIA REPAIR ;  Surgeon: Merrie Roof, MD;  Location: [facility];  Service: General;  Laterality: N/A;   WART FULGURATION  02/19/2012   Procedure: FULGURATION ANAL WART;  Surgeon: Merrie Roof, MD;  Location: [facility];  Service: General;  Laterality: N/A;  Destroy  Anal Condyloma    WISDOM TOOTH EXTRACTION  ~ [place] N/A 06/03/2016   Procedure: ABDOMINAL WOUND EXPLORATION AND STITCH REMOVAL;  Surgeon: Timothy Messing III, MD;  Location: [place];  Service: General;  Laterality: N/A;  ABDOMINAL WOUND EXPLORATION AND STITCH REMOVAL    Social History   Socioeconomic History   Marital status: Legally Separated    Spouse name: Not on file   Number of children: 3   Years of education: Not on file   Highest education level: Not on file  Occupational History   Occupation: unemployed  Tobacco Use   Smoking status: Every Day    Packs/day: 2.00    Years: 34.00    Additional pack years: 0.00    Total pack years: 68.00    Types: Cigarettes    Start date: 07/22/1981   Smokeless tobacco: Former    Types: Chew    Quit date: 12/12/2010   Tobacco comments:    Current 2 PPD smoker - Engineer, 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[employer]  Vaping Use   Vaping Use: Never used  Substance and Sexual Activity   Alcohol use: Not Currently    Comment: 6 pack  of beer daily   Drug use: Yes    Types: Oxycodone, Marijuana    Comment: Smokes marijuana once a week   Sexual activity: Not on file  Other  Topics Concern   Not on file  Social History Narrative   Lives with wife [place], Children Alyster (age 27) Sterling Big (age 46) [name] (age 54).  Unemployed less than high school education.  Updated 10/29/11   Social Determinants of Health   Financial Resource Strain: Medium Risk (07/17/2022)   Overall Financial Resource Strain (CARDIA)    Difficulty of Paying Living Expenses: Somewhat hard  Food Insecurity: No Food Insecurity (10/04/2022)   Hunger Vital Sign    Worried About Running Out of Food in the Last Year: Never true    Ran Out of Food in the Last Year: Never true  Recent Concern: Food Insecurity - Food Insecurity Present (07/31/2022)   Hunger Vital Sign    Worried About Running Out of Food in the Last Year: Sometimes true    Ran Out of Food in the Last Year: Sometimes true  Transportation Needs: No Transportation Needs (10/04/2022)   PRAPARE - Hydrologist (Medical): No    Lack of Transportation (Non-Medical): No  Physical Activity: Inactive (07/17/2022)   Exercise Vital Sign    Days of Exercise per Week: 0 days    Minutes of Exercise per Session: 0 min  Stress: Stress Concern Present (07/17/2022)   [place]    Feeling of Stress : To some extent  Social Connections: Socially Isolated (07/17/2022)   Social Connection and Isolation Panel [NHANES]    Frequency of Communication with Friends and Family: More than three times a week    Frequency of Social Gatherings with Friends and Family: Three times a week    Attends Religious Services: Never    Active Member of Clubs or Organizations: No    Attends Archivist Meetings: Never    Marital Status: Divorced  Human resources officer Violence: Not At Risk (10/04/2022)   Humiliation, Afraid, Rape, and  Kick questionnaire    Fear of Current or Ex-Partner: No    Emotionally Abused: No    Physically Abused: No    Sexually Abused: No    Family History  Problem Relation Age of Onset   Diabetes Mother    Parkinsonism Mother    Depression Mother    Alcohol abuse Father    Hypertension Father    Anxiety disorder Father    Ulcers Father    Colon polyps Father    Breast cancer Maternal Aunt    Diabetes Maternal Grandmother    Stroke Neg Hx    Heart disease Neg Hx    Colon cancer Neg Hx    Stomach cancer Neg Hx     Current Facility-Administered Medications  Medication Dose Route Frequency Provider Last Rate Last Admin   0.9 %  sodium chloride infusion   Intravenous PRN Kipp Brood, MD   Stopped at 10/15/22 1034   0.9 %  sodium chloride infusion  250 mL Intravenous PRN Michael Boston, MD       0.9 %  sodium chloride infusion  250 mL Intravenous PRN Michael Boston, MD       acetaminophen (TYLENOL) tablet 325-650 mg  325-650 mg Oral Q6H PRN Michael Boston, MD       alum & mag hydroxide-simeth (MAALOX/MYLANTA) 200-200-20 MG/5ML suspension 30 mL  30 mL Oral Q4H PRN Jovita Kussmaul, MD       Chlorhexidine Gluconate Cloth 2 % PADS 6 each  6 each Topical Q0600 Jovita Kussmaul, MD   6 each at 10/18/22 [phone]  enoxaparin (LOVENOX) injection 40 mg  40 mg Subcutaneous Q24H Pham, Minh Q, RPH-CPP   40 mg at 10/19/22 0844   HYDROmorphone (DILAUDID) injection 0.5-1 mg  0.5-1 mg Intravenous Q2H PRN Timothy Messing III, MD   1 mg at 10/19/22 [id number]   ipratropium-albuterol (DUONEB) 0.5-2.5 (3) MG/3ML nebulizer solution 3 mL  3 mL Nebulization Q4H PRN Timothy Messing III, MD   3 mL at 10/16/22 [id number]   lactated ringers bolus 1,000 mL  1,000 mL Intravenous Q8H PRN Michael Boston, MD       linezolid (ZYVOX) IVPB 600 mg  600 mg Intravenous Q12H Timothy Messing III, MD 300 mL/hr at 10/19/22 0137 600 mg at 10/19/22 [id number]   lip balm (CARMEX) ointment   Topical BID Michael Boston, MD   Given at 10/19/22 0845   losartan (COZAAR) tablet  100 mg  100 mg Oral Daily Timothy Messing III, MD   100 mg at 10/19/22 [id number]   magic mouthwash  15 mL Oral QID PRN Michael Boston, MD       menthol-cetylpyridinium (CEPACOL) lozenge 3 mg  1 lozenge Oral PRN Michael Boston, MD       methocarbamol (ROBAXIN) tablet 500 mg  500 mg Oral QID Timothy Messing III, MD   500 mg at 10/19/22 0844   metoprolol tartrate (LOPRESSOR) injection 5 mg  5 mg Intravenous Q6H PRN Greer Pickerel, MD   5 mg at 10/18/22 0251   ondansetron (ZOFRAN) tablet 4 mg  4 mg Oral Q6H PRN Jovita Kussmaul, MD       Oral care mouth rinse  15 mL Mouth Rinse PRN Timothy Messing III, MD       oxyCODONE (Oxy IR/ROXICODONE) immediate release tablet 5 mg  5 mg Oral Q4H PRN Timothy Messing III, MD   5 mg at 10/19/22 0858   pantoprazole (PROTONIX) EC tablet 40 mg  40 mg Oral QHS Timothy Messing III, MD   40 mg at 10/18/22 2222   phenol (CHLORASEPTIC) mouth spray 2 spray  2 spray Mouth/Throat PRN Michael Boston, MD       piperacillin-tazobactam (ZOSYN) IVPB 3.375 g  3.375 g Intravenous Q8H Jillyn Ledger, PA-C 12.5 mL/hr at 10/19/22 0645 3.375 g at 10/19/22 0645   potassium chloride 10 mEq in 100 mL IVPB  10 mEq Intravenous Q1 Hr x 4 Miran Kautzman, Remo Lipps, MD 100 mL/hr at 10/19/22 1150 10 mEq at 10/19/22 1150   potassium chloride SA (KLOR-CON M) CR tablet 40 mEq  40 mEq Oral BID Michael Boston, MD   40 mEq at 10/19/22 1022   promethazine (PHENERGAN) 12.5 mg in sodium chloride 0.9 % 50 mL IVPB  12.5 mg Intravenous Q6H PRN Greer Pickerel, MD   Stopped at 10/13/22 1147   simethicone (MYLICON) 40 [id number] suspension 80 mg  80 mg Oral QID PRN Michael Boston, MD       sodium chloride flush (NS) 0.9 % injection 3 mL  3 mL Intravenous Gorden Harms, MD   3 mL at 10/19/22 0139   sodium chloride flush (NS) 0.9 % injection 3 mL  3 mL Intravenous PRN Michael Boston, MD       sodium chloride flush (NS) 0.9 % injection 3 mL  3 mL Intravenous Gorden Harms, MD   3 mL at 10/19/22 0845   sodium chloride flush (NS) 0.9 % injection 3 mL  3 mL  Intravenous PRN Michael Boston, MD       traMADol Veatrice Bourbon) tablet 50 mg  50 mg  Oral Q6H PRN Kipp Brood, MD   50 mg at 10/19/22 1048     Allergies  Allergen Reactions   Ambien [Zolpidem Tartrate] Other (See Comments)    Caused agitation of mood.    Bactroban [Mupirocin Calcium] Swelling    Caused infection at a surgical site   Zestril [Lisinopril] Cough   Propoxyphene Itching   Morphine And Related Itching    Signed:   Adin Hector, MD, FACS, MASCRS Esophageal, Gastrointestinal & Colorectal Surgery Robotic and Minimally Invasive Surgery  [facility] [id number] [place], [place] [address] [phone] Fax [phone] Main  CONTACT INFORMATION:  Weekday (9AM-5PM): Call [facility] main office at [phone]  Weeknight (5PM-9AM) or Weekend/Holiday: Check [email or web address] (password " [email or web address]") for General Surgery [facility] coverage  (Please, do not use SecureChat as it is not reliable communication to reach operating surgeons for immediate patient care given surgeries/outpatient duties/clinic/cross-coverage/off post-call which would lead to a delay in care.  Epic staff messaging available for outptient concerns, but may not be answered for 48 hours or more).     10/19/2022, 11:53 AM

## 2022-10-19 NOTE — Plan of Care (Signed)

## 2022-10-19 NOTE — TOC Transition Note (Signed)
Transition of Care [facility]) - CM/SW Discharge Note   Patient Details  Name: Timothy [name] MRN: [id number] Date of Birth: 07-07-1966  Transition of Care [facility]) CM/SW Contact:  Tom-Johnson, Renea Ee, RN Phone Number: 10/19/2022, 2:42 PM   Clinical Narrative:     Patient is scheduled for discharge today. Readmission Prevention Assessment done.  Outpatient referrals, hospital f/u and discharge instructions on AVS.  Home health info on AVS. RW ordered from [employer] and will be delivered to patient's home after authorization.   Family to transport at discharge. No further TOC needs noted.        Final next level of care: Home w Home Health Services Barriers to Discharge: Barriers Resolved   Patient Goals and CMS Choice CMS Medicare.gov Compare Post Acute Care list provided to:: Patient Choice offered to / list presented to : Patient  Discharge Placement                  Patient to be transferred to facility by: Family      Discharge Plan and Services Additional resources added to the After Visit Summary for     Discharge Planning Services: CM Consult Post Acute Care Choice: Home Health          DME Arranged: N/A DME Agency: NA       HH Arranged: RN          Social Determinants of Health (SDOH) Interventions SDOH Screenings   Food Insecurity: No Food Insecurity (10/04/2022)  Recent Concern: [facility] Present (07/31/2022)  Housing: Low Risk  (10/04/2022)  Transportation Needs: No Transportation Needs (10/04/2022)  Utilities: Not At Risk (10/04/2022)  Recent Concern: Utilities - At Risk (07/31/2022)  Alcohol Screen: Medium Risk (07/17/2022)  Depression (PHQ2-9): High Risk (09/02/2022)  Financial Resource Strain: Medium Risk (07/17/2022)  Physical Activity: Inactive (07/17/2022)  Social Connections: Socially Isolated (07/17/2022)  Stress: Stress Concern Present (07/17/2022)  Tobacco Use: High Risk (10/14/2022)     Readmission Risk  Interventions    10/19/2022    2:41 PM 10/17/2022    2:52 PM  Readmission Risk Prevention Plan  Transportation Screening Complete Complete  PCP or Specialist Appt within 5-7 Days  Complete  PCP or Specialist Appt within 3-5 Days Complete   Home Care Screening  Complete  Medication Review (RN CM)  Referral to Pharmacy  HRI or [place] Complete   Social Work Consult for [place] Planning/Counseling [place] Not Applicable   Medication Review Press photographer) Referral to Pharmacy

## 2022-10-21 ENCOUNTER — Telehealth: Payer: Self-pay

## 2022-10-21 NOTE — Transitions of Care (Post Inpatient/ED Visit) (Signed)
   10/21/2022  Name: Timothy [name] MRN: [id number] DOB: [birth year]  Today's TOC FU Call Status: Today's TOC FU Call Status:: Successful TOC FU Call Competed TOC FU Call Complete Date: 10/21/22  Transition Care Management Follow-up Telephone Call Date of Discharge: 10/19/22 Discharge Facility: [name]) Type of Discharge: Inpatient Admission Primary Inpatient Discharge Diagnosis:: abd wall abscess How have you been since you were released from the hospital?: Better Any questions or concerns?: No  Items Reviewed: Did you receive and understand the discharge instructions provided?: Yes Medications obtained and verified?: Yes (Medications Reviewed) Any new allergies since your discharge?: No Dietary orders reviewed?: Yes Do you have support at home?: Yes People in Home: child(ren), adult  Home Care and Equipment/Supplies: [place] Ordered?: Yes Name of [place]:: [place] set up a time to come to your home?: No Any new equipment or medical supplies ordered?: Yes Name of Medical supply agency?: [facility] Were you able to get the equipment/medical supplies?: Yes Do you have any questions related to the use of the equipment/supplies?: No  Functional Questionnaire: Do you need assistance with bathing/showering or dressing?: Yes Do you need assistance with meal preparation?: No Do you need assistance with eating?: No Do you have difficulty maintaining continence: No Do you need assistance with getting out of bed/getting out of a chair/moving?: No Do you have difficulty managing or taking your medications?: No  Follow up appointments reviewed: PCP Follow-up appointment confirmed?: [facility] Follow-up appointment confirmed?: No Reason Specialist Follow-Up Not Confirmed: Patient has Specialist Provider Number and will Call for Appointment Do you need transportation to your follow-up appointment?: No Do you understand care  options if your condition(s) worsen?: Yes-patient verbalized understanding    [place], [place] Nurse Health Advisor Direct Dial [phone]

## 2022-10-22 ENCOUNTER — Other Ambulatory Visit: Payer: Self-pay

## 2022-10-22 ENCOUNTER — Inpatient Hospital Stay (HOSPITAL_COMMUNITY)
Admission: EM | Admit: 2022-10-22 | Discharge: 2022-10-24 | DRG: 862 | Disposition: A | Discharge status: Home / Self Care | Payer: Medicare Other | Attending: Family Medicine | Admitting: Family Medicine

## 2022-10-22 ENCOUNTER — Encounter (HOSPITAL_COMMUNITY): Payer: Self-pay | Admitting: *Deleted

## 2022-10-22 ENCOUNTER — Emergency Department (HOSPITAL_COMMUNITY): Payer: Medicare Other

## 2022-10-22 ENCOUNTER — Observation Stay (HOSPITAL_COMMUNITY): Payer: Medicare Other

## 2022-10-22 DIAGNOSIS — L02211 Cutaneous abscess of abdominal wall: Secondary | ICD-10-CM | POA: Diagnosis present

## 2022-10-22 DIAGNOSIS — Z803 Family history of malignant neoplasm of breast: Secondary | ICD-10-CM

## 2022-10-22 DIAGNOSIS — Z818 Family history of other mental and behavioral disorders: Secondary | ICD-10-CM

## 2022-10-22 DIAGNOSIS — F419 Anxiety disorder, unspecified: Secondary | ICD-10-CM | POA: Diagnosis present

## 2022-10-22 DIAGNOSIS — J449 Chronic obstructive pulmonary disease, unspecified: Secondary | ICD-10-CM | POA: Diagnosis present

## 2022-10-22 DIAGNOSIS — Z833 Family history of diabetes mellitus: Secondary | ICD-10-CM

## 2022-10-22 DIAGNOSIS — D75839 Thrombocytosis, unspecified: Secondary | ICD-10-CM | POA: Diagnosis present

## 2022-10-22 DIAGNOSIS — T8143XA Infection following a procedure, organ and space surgical site, initial encounter: Principal | ICD-10-CM | POA: Diagnosis present

## 2022-10-22 DIAGNOSIS — K651 Peritoneal abscess: Secondary | ICD-10-CM | POA: Diagnosis present

## 2022-10-22 DIAGNOSIS — Z811 Family history of alcohol abuse and dependence: Secondary | ICD-10-CM

## 2022-10-22 DIAGNOSIS — I10 Essential (primary) hypertension: Secondary | ICD-10-CM | POA: Diagnosis present

## 2022-10-22 DIAGNOSIS — A419 Sepsis, unspecified organism: Secondary | ICD-10-CM | POA: Diagnosis present

## 2022-10-22 DIAGNOSIS — R911 Solitary pulmonary nodule: Secondary | ICD-10-CM | POA: Diagnosis present

## 2022-10-22 DIAGNOSIS — E861 Hypovolemia: Secondary | ICD-10-CM | POA: Diagnosis present

## 2022-10-22 DIAGNOSIS — F1011 Alcohol abuse, in remission: Secondary | ICD-10-CM | POA: Diagnosis present

## 2022-10-22 DIAGNOSIS — Z888 Allergy status to other drugs, medicaments and biological substances status: Secondary | ICD-10-CM

## 2022-10-22 DIAGNOSIS — G43909 Migraine, unspecified, not intractable, without status migrainosus: Secondary | ICD-10-CM | POA: Diagnosis present

## 2022-10-22 DIAGNOSIS — Z560 Unemployment, unspecified: Secondary | ICD-10-CM

## 2022-10-22 DIAGNOSIS — D638 Anemia in other chronic diseases classified elsewhere: Secondary | ICD-10-CM | POA: Diagnosis present

## 2022-10-22 DIAGNOSIS — Z885 Allergy status to narcotic agent status: Secondary | ICD-10-CM

## 2022-10-22 DIAGNOSIS — Z79899 Other long term (current) drug therapy: Secondary | ICD-10-CM

## 2022-10-22 DIAGNOSIS — L089 Local infection of the skin and subcutaneous tissue, unspecified: Secondary | ICD-10-CM | POA: Diagnosis present

## 2022-10-22 DIAGNOSIS — F17210 Nicotine dependence, cigarettes, uncomplicated: Secondary | ICD-10-CM | POA: Diagnosis present

## 2022-10-22 DIAGNOSIS — D500 Iron deficiency anemia secondary to blood loss (chronic): Secondary | ICD-10-CM | POA: Diagnosis present

## 2022-10-22 DIAGNOSIS — K219 Gastro-esophageal reflux disease without esophagitis: Secondary | ICD-10-CM | POA: Diagnosis present

## 2022-10-22 DIAGNOSIS — J439 Emphysema, unspecified: Secondary | ICD-10-CM | POA: Diagnosis present

## 2022-10-22 DIAGNOSIS — Z1152 Encounter for screening for COVID-19: Secondary | ICD-10-CM

## 2022-10-22 DIAGNOSIS — F319 Bipolar disorder, unspecified: Secondary | ICD-10-CM | POA: Diagnosis present

## 2022-10-22 DIAGNOSIS — H409 Unspecified glaucoma: Secondary | ICD-10-CM | POA: Diagnosis present

## 2022-10-22 DIAGNOSIS — Z933 Colostomy status: Secondary | ICD-10-CM

## 2022-10-22 DIAGNOSIS — K5780 Diverticulitis of intestine, part unspecified, with perforation and abscess without bleeding: Secondary | ICD-10-CM | POA: Diagnosis present

## 2022-10-22 DIAGNOSIS — Z8249 Family history of ischemic heart disease and other diseases of the circulatory system: Secondary | ICD-10-CM

## 2022-10-22 DIAGNOSIS — Z881 Allergy status to other antibiotic agents status: Secondary | ICD-10-CM

## 2022-10-22 DIAGNOSIS — T8130XA Disruption of wound, unspecified, initial encounter: Secondary | ICD-10-CM | POA: Diagnosis present

## 2022-10-22 LAB — RESP PANEL BY RT-PCR (RSV, FLU A&B, COVID)  RVPGX2
Influenza A by PCR: NEGATIVE
Influenza B by PCR: NEGATIVE
Resp Syncytial Virus by PCR: NEGATIVE
SARS Coronavirus 2 by RT PCR: NEGATIVE

## 2022-10-22 LAB — CBC WITH DIFFERENTIAL/PLATELET
Abs Immature Granulocytes: 0.18 10*3/uL — ABNORMAL HIGH (ref 0.00–0.07)
Basophils Absolute: 0.1 10*3/uL (ref 0.0–0.1)
Basophils Relative: 0 %
Eosinophils Absolute: 0.1 10*3/uL (ref 0.0–0.5)
Eosinophils Relative: 1 %
HCT: 36.3 % — ABNORMAL LOW (ref 39.0–52.0)
Hemoglobin: 11.3 g/dL — ABNORMAL LOW (ref 13.0–17.0)
Immature Granulocytes: 1 %
Lymphocytes Relative: 6 %
Lymphs Abs: 1.2 10*3/uL (ref 0.7–4.0)
MCH: 30.1 pg (ref 26.0–34.0)
MCHC: 31.1 g/dL (ref 30.0–36.0)
MCV: 96.8 fL (ref 80.0–100.0)
Monocytes Absolute: 1.1 10*3/uL — ABNORMAL HIGH (ref 0.1–1.0)
Monocytes Relative: 6 %
Neutro Abs: 17.3 10*3/uL — ABNORMAL HIGH (ref 1.7–7.7)
Neutrophils Relative %: 86 %
Platelets: 1055 10*3/uL (ref 150–400)
RBC: 3.75 MIL/uL — ABNORMAL LOW (ref 4.22–5.81)
RDW: 14.9 % (ref 11.5–15.5)
WBC: 20.1 10*3/uL — ABNORMAL HIGH (ref 4.0–10.5)
nRBC: 0 % (ref 0.0–0.2)

## 2022-10-22 LAB — I-STAT CHEM 8, ED
BUN: 12 mg/dL (ref 6–20)
Calcium, Ion: 1.02 mmol/L — ABNORMAL LOW (ref 1.15–1.40)
Chloride: 104 mmol/L (ref 98–111)
Creatinine, Ser: 0.9 mg/dL (ref 0.61–1.24)
Glucose, Bld: 123 mg/dL — ABNORMAL HIGH (ref 70–99)
HCT: 37 % — ABNORMAL LOW (ref 39.0–52.0)
Hemoglobin: 12.6 g/dL — ABNORMAL LOW (ref 13.0–17.0)
Potassium: 3.7 mmol/L (ref 3.5–5.1)
Sodium: 140 mmol/L (ref 135–145)
TCO2: 24 mmol/L (ref 22–32)

## 2022-10-22 LAB — COMPREHENSIVE METABOLIC PANEL
ALT: 23 U/L (ref 0–44)
AST: 26 U/L (ref 15–41)
Albumin: 2.3 g/dL — ABNORMAL LOW (ref 3.5–5.0)
Alkaline Phosphatase: 134 U/L — ABNORMAL HIGH (ref 38–126)
Anion gap: 13 (ref 5–15)
BUN: 10 mg/dL (ref 6–20)
CO2: 22 mmol/L (ref 22–32)
Calcium: 8.1 mg/dL — ABNORMAL LOW (ref 8.9–10.3)
Chloride: 103 mmol/L (ref 98–111)
Creatinine, Ser: 0.9 mg/dL (ref 0.61–1.24)
GFR, Estimated: >60 mL/min (ref >60)
Glucose, Bld: 120 mg/dL — ABNORMAL HIGH (ref 70–99)
Potassium: 3.7 mmol/L (ref 3.5–5.1)
Sodium: 138 mmol/L (ref 135–145)
Total Bilirubin: 0.5 mg/dL (ref 0.3–1.2)
Total Protein: 7.1 g/dL (ref 6.5–8.1)

## 2022-10-22 LAB — URINALYSIS, W/ REFLEX TO CULTURE (INFECTION SUSPECTED)
Bilirubin Urine: NEGATIVE
Glucose, UA: NEGATIVE mg/dL
Hgb urine dipstick: NEGATIVE
Ketones, ur: NEGATIVE mg/dL
Leukocytes,Ua: NEGATIVE
Nitrite: NEGATIVE
Protein, ur: NEGATIVE mg/dL
Specific Gravity, Urine: 1.012 (ref 1.005–1.030)
pH: 6 (ref 5.0–8.0)

## 2022-10-22 LAB — APTT: aPTT: 29 seconds (ref 24–36)

## 2022-10-22 LAB — PROTIME-INR
INR: 1 (ref 0.8–1.2)
Prothrombin Time: 13.2 seconds (ref 11.4–15.2)

## 2022-10-22 LAB — LACTIC ACID, PLASMA: Lactic Acid, Venous: 1.9 mmol/L (ref 0.5–1.9)

## 2022-10-22 MED ORDER — LACTATED RINGERS IV SOLN: INTRAVENOUS | Status: DC

## 2022-10-22 MED ORDER — HEPARIN SODIUM (PORCINE) 5000 UNIT/ML IJ SOLN
5000.0000 [IU] | Freq: Three times a day (TID) | INTRAMUSCULAR | Status: DC
  Administered 2022-10-22 – 2022-10-23 (×4): 5000 [IU] via SUBCUTANEOUS
  Filled 2022-10-22 (×5): qty 1

## 2022-10-22 MED ORDER — BUSPIRONE HCL 5 MG PO TABS
15.0000 mg | ORAL_TABLET | Freq: Every morning | ORAL | Status: DC
  Administered 2022-10-23 – 2022-10-24 (×2): 15 mg via ORAL
  Filled 2022-10-22 (×2): qty 1

## 2022-10-22 MED ORDER — GABAPENTIN 100 MG PO CAPS
100.0000 mg | ORAL_CAPSULE | Freq: Four times a day (QID) | ORAL | Status: DC | PRN
  Administered 2022-10-23 (×2): 100 mg via ORAL
  Filled 2022-10-22 (×2): qty 1

## 2022-10-22 MED ORDER — METRONIDAZOLE 500 MG/100ML IV SOLN
500.0000 mg | Freq: Once | INTRAVENOUS | Status: AC
  Administered 2022-10-22: 500 mg via INTRAVENOUS
  Filled 2022-10-22: qty 100

## 2022-10-22 MED ORDER — HYDROMORPHONE HCL 1 MG/ML IJ SOLN
1.0000 mg | Freq: Once | INTRAMUSCULAR | Status: AC
  Administered 2022-10-22: 1 mg via INTRAVENOUS
  Filled 2022-10-22: qty 1

## 2022-10-22 MED ORDER — SODIUM CHLORIDE 0.9 % IV SOLN
2.0000 g | Freq: Three times a day (TID) | INTRAVENOUS | Status: DC
  Administered 2022-10-22 – 2022-10-24 (×6): 2 g via INTRAVENOUS
  Filled 2022-10-22 (×6): qty 12.5

## 2022-10-22 MED ORDER — UMECLIDINIUM-VILANTEROL 62.5-25 MCG/ACT IN AEPB
1.0000 | INHALATION_SPRAY | Freq: Every day | RESPIRATORY_TRACT | Status: DC
  Administered 2022-10-23 – 2022-10-24 (×2): 1 via RESPIRATORY_TRACT
  Filled 2022-10-22 (×2): qty 14

## 2022-10-22 MED ORDER — SERTRALINE HCL 100 MG PO TABS
100.0000 mg | ORAL_TABLET | Freq: Every day | ORAL | Status: DC
  Administered 2022-10-22 – 2022-10-24 (×3): 100 mg via ORAL
  Filled 2022-10-22 (×3): qty 1

## 2022-10-22 MED ORDER — IOHEXOL 350 MG/ML SOLN
75.0000 mL | Freq: Once | INTRAVENOUS | Status: AC | PRN
  Administered 2022-10-22: 75 mL via INTRAVENOUS

## 2022-10-22 MED ORDER — LOSARTAN POTASSIUM 50 MG PO TABS
100.0000 mg | ORAL_TABLET | Freq: Every day | ORAL | Status: DC
  Administered 2022-10-22 – 2022-10-24 (×3): 100 mg via ORAL
  Filled 2022-10-22 (×3): qty 2

## 2022-10-22 MED ORDER — SODIUM CHLORIDE 0.9 % IV SOLN
2.0000 g | Freq: Once | INTRAVENOUS | Status: AC
  Administered 2022-10-22: 2 g via INTRAVENOUS
  Filled 2022-10-22: qty 12.5

## 2022-10-22 MED ORDER — LACTATED RINGERS IV BOLUS (SEPSIS)
1000.0000 mL | Freq: Once | INTRAVENOUS | Status: AC
  Administered 2022-10-22: 1000 mL via INTRAVENOUS

## 2022-10-22 MED ORDER — ACETAMINOPHEN 500 MG PO TABS
1000.0000 mg | ORAL_TABLET | Freq: Four times a day (QID) | ORAL | Status: DC | PRN
  Administered 2022-10-22: 1000 mg via ORAL
  Filled 2022-10-22: qty 2

## 2022-10-22 MED ORDER — LACTATED RINGERS IV BOLUS (SEPSIS)
250.0000 mL | Freq: Once | INTRAVENOUS | Status: AC
  Administered 2022-10-22: 250 mL via INTRAVENOUS

## 2022-10-22 MED ORDER — OXYCODONE HCL 5 MG PO TABS
5.0000 mg | ORAL_TABLET | Freq: Once | ORAL | Status: AC
  Administered 2022-10-22: 5 mg via ORAL
  Filled 2022-10-22: qty 1

## 2022-10-22 MED ORDER — ONDANSETRON HCL 4 MG/2ML IJ SOLN
4.0000 mg | Freq: Once | INTRAMUSCULAR | Status: AC
  Administered 2022-10-22: 4 mg via INTRAVENOUS
  Filled 2022-10-22: qty 2

## 2022-10-22 NOTE — Progress Notes (Signed)
Pharmacy Antibiotic Note  Timothy [name] is a 57 y.o. male admitted on 10/22/2022 presenting with fall, hx of many abdominal surgeries, colostomy, abdominal abscess s/p drain and IV abx.  Pharmacy has been consulted for cefepime dosing.  Flagyl per MD  Plan: Cefepime 2g IV every 8 hours Monitor renal function, Cx and clinical progression to narrow  Height: 5\' 9"  (175.3 cm) Weight: 74.4 kg (164 lb) IBW/kg (Calculated) : 70.7  Temp (24hrs), Avg:98.1 F (36.7 C), Min:98.1 F (36.7 C), Max:98.1 F (36.7 C)  Recent Labs  Lab 10/16/22 0533 10/17/22 1348 10/19/22 0326 10/22/22 1048 10/22/22 1055  WBC  --  13.4* 10.1  --   --   CREATININE 0.85  --  0.86 0.90 0.90  LATICACIDVEN  --   --   --  1.9  --     Estimated Creatinine Clearance: 93.8 mL/min (by C-G formula based on SCr of 0.9 mg/dL).    Allergies  Allergen Reactions   Ambien [Zolpidem Tartrate] Other (See Comments)    Caused agitation of mood.    Bactroban [Mupirocin Calcium] Swelling    Caused infection at a surgical site   Zestril [Lisinopril] Cough   Propoxyphene Itching   Morphine And Related Itching    Bertis Ruddy, PharmD, [place] Pharmacist ED Pharmacist Phone # [phone] 10/22/2022 11:46 AM

## 2022-10-22 NOTE — Progress Notes (Signed)
Progress Note     Subjective: Patient is a 57 year old male with Hx of prior sigmoid colectomy for diverticulitis, anastomotic leak followed by Jeanette Caprice resection in 2013 and colostomy takedown with hernia repair in 2014. 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 Also had colon resection with anastomosis. Initially did well but then developed anastomotic leak and had to be taken back to the OR 10/13/22 for colon resection and colostomy. Mild post-operative ileus which resolved and patient completed a course of antibiotics peri-operatively. Patient was discharged 10/19/22.   Came to ED today s/p fall at home and reports he started having some dizziness and lightheadedness starting yesterday. Associated nausea and vomiting started yesterday as well. Having colostomy output. Had issues with colostomy leaking overnight. Has been changing midline dressing BID at home. He reports he is still smoking cigarettes although not as much recently. Prior to surgery 3/15 was smoking 2 PPD.   In ED: WBC elevated at 20.1, afebrile, mildly tachycardic in the 100s, normotensive. CT AP shows decreased LLQ collection. WOC saw in ED as well and noted mucocutaneous separation measuring 2 cms x 2 cms x 0.8 cms at 9 o'clock, erythema of skin noted circumferential, also induration noted from 7 o'clock to 12 o'clock.   Objective: Vital signs in last 24 hours: Temp:  [98.1 F (36.7 C)-98.8 F (37.1 C)] 98.8 F (37.1 C) (04/02 1411) Pulse Rate:  [102-117] 102 (04/02 1400) Resp:  [16-20] 20 (04/02 1400) BP: (139-154)/(84-101) 139/84 (04/02 1400) SpO2:  [96 %-98 %] 96 % (04/02 1400) Weight:  [74.4 kg] 74.4 kg (04/02 1043)    Intake/Output from previous day: No intake/output data recorded. Intake/Output this shift: Total I/O In: 2000 [IV Piggyback:2000] Out: -   PE: General: pleasant, WD, WN male who is laying in bed in NAD Heart:  regular, rate, and rhythm.  Lungs: Respiratory effort nonlabored Abd: soft, appropriately ttp, stoma with some mucocutaneous separation noted but viable, midline wound with fascial separation but no visible bowel and fibrinous tissue in wound base    Lab Results:  Recent Labs    10/22/22 1048 10/22/22 1055  WBC 20.1*  --   HGB 11.3* 12.6*  HCT 36.3* 37.0*  PLT 1,055*  --    BMET Recent Labs    10/22/22 1048 10/22/22 1055  NA 138 140  K 3.7 3.7  CL 103 104  CO2 22  --   GLUCOSE 120* 123*  BUN 10 12  CREATININE 0.90 0.90  CALCIUM 8.1*  --    PT/INR Recent Labs    10/22/22 1048  LABPROT 13.2  INR 1.0   CMP     Component Value Date/Time   NA 140 10/22/2022 1055   NA 137 07/25/2021 1253   K 3.7 10/22/2022 1055   CL 104 10/22/2022 1055   CO2 22 10/22/2022 1048   GLUCOSE 123 (H) 10/22/2022 1055   BUN 12 10/22/2022 1055   BUN 25 (H) 07/25/2021 1253   CREATININE 0.90 10/22/2022 1055   CREATININE 0.98 05/16/2016 1129   CALCIUM 8.1 (L) 10/22/2022 1048   PROT 7.1 10/22/2022 1048   PROT 6.8 11/11/2016 1135   ALBUMIN 2.3 (L) 10/22/2022 1048   ALBUMIN 4.2 11/11/2016 1135   AST 26 10/22/2022 1048   ALT 23 10/22/2022 1048   ALKPHOS 134 (H) 10/22/2022 1048   BILITOT 0.5 10/22/2022 1048   BILITOT <0.2 11/11/2016 1135   GFRNONAA >60 10/22/2022 1048  [facility] >89 05/16/2016 1129   GFRAA 66 03/24/2020 1609   GFRAA >89 05/16/2016 1129   Lipase     Component Value Date/Time   LIPASE 33 02/02/2018 1654       Studies/Results: DG Chest 2 View  Result Date: 10/22/2022 CLINICAL DATA:  Possible sepsis, dizziness EXAM: CHEST - 2 VIEW COMPARISON:  10/14/2022 FINDINGS: Cardiac size is within normal limits. There is interval removal of endotracheal tube and NG tube. Lung fields are clear of any infiltrates or pulmonary edema. There is no pleural effusion or pneumothorax. There is significant interval decrease in amount of pneumoperitoneum under the right hemidiaphragm.  IMPRESSION: There are no signs of pulmonary edema or focal pulmonary consolidation. There is significant interval decrease in amount of pneumoperitoneum under the right hemidiaphragm in comparison with the study of 10/14/2022. Electronically Signed   By: Elmer Picker M.D.   On: 10/22/2022 11:32    Anti-infectives: Anti-infectives (From admission, onward)    Start     Dose/Rate Route Frequency Ordered Stop   10/22/22 2000  ceFEPIme (MAXIPIME) 2 g in sodium chloride 0.9 % 100 mL IVPB        2 g 200 mL/hr over 30 Minutes Intravenous Every 8 hours 10/22/22 1142     10/22/22 1145  ceFEPIme (MAXIPIME) 2 g in sodium chloride 0.9 % 100 mL IVPB        2 g 200 mL/hr over 30 Minutes Intravenous  Once 10/22/22 1140 10/22/22 1349   10/22/22 1145  metroNIDAZOLE (FLAGYL) IVPB 500 mg        500 mg 100 mL/hr over 60 Minutes Intravenous  Once 10/22/22 1140 10/22/22 1349        Assessment/Plan  S/P ex-lap, drainage of abdominal wall abscess, removal of mesh, segmental colon resection and LOA 10/04/22 Dr. Marlou Starks Anastomotic leak  S/P ex-lap, resection of colon anastomosis and end colostomy 10/13/22 Dr. Ninfa Linden - recently discharged 3/30, returned to ED today after fall at home  - WBC elevated at 20.1, afebrile, mildly tachycardic in the 100s, normotensive. CT AP shows decreased LLQ collection - agree with medical admission and we will follow  - WOC following for stomal care - recommend IV abx and trend WBC - if persistent or worsening leukocytosis, can have IR eval for aspiration vs drainage. If improving then likely home on PO abx in a few days with follow up outpatient CT and close follow up with surgeon - recommend mepitel in wound base and BID wet to dry dressing changes  - recommend abdominal binder to try to prevent further dehiscence   FEN: FLD, IVF per primary VTE: ok to have SQH or LMWH from surgical standpoint  ID: cefepime/flagyl given in ED  HTN Emphysema   GERD Glaucoma Migraines Hx of alcohol abuse Depression/Anxiety Current tobacco abuse   LOS: 0 days   I reviewed last 24 h vitals and pain scores, last 48 h intake and output, last 24 h labs and trends, last 24 h imaging results, and previous surgical notes .    Norm Parcel, [facility] Surgery 10/22/2022, 2:30 PM Please see Amion for pager number during day hours 7:00am-4:30pm

## 2022-10-22 NOTE — H&P (Signed)
Hospital Admission History and Physical Service Pager: [phone]  Patient name: Timothy [name] Medical record number: [id number] Date of Birth: 07-19-1966 Age: 57 y.o. Gender: male  Primary Care Provider: Gerrit Heck, MD Consultants: General Surgery  Code Status: Full  Preferred Emergency Contact:  Contact Information     Name Relation Home Work Mobile   Rhodes,Dawn Significant other   [phone]        Chief Complaint: Vomiting, Weakness, Recent Surgery  Assessment and Plan: Timothy [name] is a 57 y.o. male presenting with a fall at home 2/2 dizziness/lightheadedness in the setting of vomiting. He was just discharged from the hospital on 3/30 after a prolonged hospitalization on the surgery service for drainage of an abdominal wall abscess and resection of an anastomotic leak. Differential for this patient's presentation includes sepsis from his known abdominal wall abscess versus late surgical site infection versus sepsis from other source though thankfully CXR without focal consolidation and UA clear.   * Sepsis Patient with recent abdominal surgery meeting sepsis criteria for tachycardia, tachypnea, and WBC 20.1 with a significant neutrophilic predominance. Confused and weak on initial ED presentation, doing much better now with IV fluid resuscitation. Has a known abdominal wall abscess that, thankfully, is smaller today compared with prior CT scans. CXR is negative. The best explanation we have as of now is that the source of infection is the abscess in his LLQ.  - Place in observation, med-surg floor - Continue Cefepime and Flagyl - IVF x12h, okay for full liquid diet - Follow blood cultures - Will follow vitals and trend WBC, if worsening, likely needs drainage of abscess - General surgery has seen the patient and will follow with [place] - Wound care per general surgery recommendations - Abdominal binder to prevent wound dehiscence  Lung nodule seen on imaging  study Mention of bibasilar lung nodules up to 1.2cm on the Right. Note that this is a new finding when compared to CT scan from just four months ago. With him reporting shortness of breath and new thrombocytosis, certainly could represent a new lung cancer in patient with heavy smoking history. - CT Chest WO Contrast    Thrombocytosis Platelets increased to 1055. Has been high in the past, but generally in the 500-600s. Also note that this does seem relatively new, within the past year or so. Suppose this elevation could represent some hemoconcentration given he was quite dry on presentation, but likely also represents elevation in the setting of an acute on chronic infectious process. Could also be related to a myeloproliferative disorder or paraneoplastic in the setting of new lung nodules as discussed above. - Pathologist smear review - Trend on CBC  - CT Chest as Above  Bipolar disorder Not on a mood stabilizer at this time, but reports he is well controlled on Sertaline and Buspar. - Continue home sertraline and buspar   Chronic obstructive pulmonary disease No wheeze on exam. - Continue home Anoro ellipta daily     FEN/GI: FLD, IVF x 12 hours VTE Prophylaxis: Heparin  Disposition: Med-Surg  History of Present Illness:  Timothy [name] is a 57 y.o. male presenting with weakness and a fall in the setting of nausea and vomiting after recent abdominal surgery.   Recently admitted to surgery service for recurrent abdominal wall abscess. Surgery on 3/15 for ex-lap with drainage of abscess and removal of hernia mesh. Also with colonic resection. Then developed anastomotic leak and was taken back to the OR on 3/24 for resection  of his colonic anastomosis and creation of colostomy.   Feeling okay since discharge until last night. Ate normal dinner (burrito). Throwing up a lot starting last night and into this morning. Also noted that his ostomy bag had come off in the night last night and he  would be covered in stool, including in the ventral abdominal wound.  Today he got so weak that he fell and was unable to get up. His son called EMS who brought him in to the ED.   In the ED he was noted to be confused and clinically hypovolemic. He presented as a code sepsis due to tachycardia, tachypnea, and WBC >20k. He got 2265mL LR bolus and was started on cefepime and Flagyl for presume intraabdominal infection. CT of the abdomen/pelvis showed that the known abscess in his LLQ was decreased in size compared to previous studies, there was no evidence of new intraabdominal infection. General surgery was consulted by EDP and saw the patient and recommended admission to medicine.   Review Of Systems: Per HPI with the following additions: Review of Systems  Constitutional:  Negative for chills and fever.  Respiratory:  Positive for shortness of breath. Negative for cough and wheezing.   Gastrointestinal:  Positive for abdominal pain and vomiting. Negative for blood in stool.  Neurological:  Positive for weakness.     Pertinent Past Medical History: Heavy tobacco use Bipolar disorder Remainder reviewed in history tab.   Pertinent Past Surgical History: 2013- Hartmann Resection 2014- Colostomy takedown and hernia repair 10/04/22- Abscess drainage, mesh removal, partial colectomy with anastomosis 10/15/22- Resection of anastomosis and colostomy creation  Remainder reviewed in history tab.  Pertinent Social History: Tobacco use: Yes 2ppd Alcohol use: 6-10 beers/day; only one beer since leaving the hospital (3/31)  Other Substance use: Weed "here and there"; past cocaine use; never IVDU Lives with Son  Pertinent Family History: Remainder reviewed in history tab.   Important Outpatient Medications: Remainder reviewed in medication history.   Objective: BP (!) 140/86   Pulse 75   Temp 98.7 F (37.1 C) (Oral)   Resp 18   Ht 5\' 9"  (1.753 m)   Wt 74.4 kg   SpO2 98%   BMI 24.22  kg/m  Exam: General: In good spirits, alert and NAD Eyes: Sclerae anicteric, EOMs intact ENTM: Mucous membranes tacky Neck: Supple Cardiovascular: RRR, without murmur  Respiratory: Normal WOB on RA, lungs clear  Gastrointestinal: Large ventral abdominal wound with mild dehiscence, no bowel visible. Colostomy with bag in place. Abdomen is soft and non-distended, tender, but not out of proportion to recent surgical history [id number] edema or deformity Derm: Without rash Neuro: Moving all extremities well Psych: Mood and affect are appropriate to situation  Labs:  CBC BMET  Recent Labs  Lab 10/22/22 1048 10/22/22 1055  WBC 20.1*  --   HGB 11.3* 12.6*  HCT 36.3* 37.0*  PLT 1,055*  --    Recent Labs  Lab 10/22/22 1048 10/22/22 1055  NA 138 140  K 3.7 3.7  CL 103 104  CO2 22  --   BUN 10 12  CREATININE 0.90 0.90  GLUCOSE 120* 123*  CALCIUM 8.1*  --     Lactic acid 1.9  EKG:  Sinus tachycardia, QTc 466   Imaging Studies Performed: CT Abdomen Pelvis W Contrast  IMPRESSION: 1. Left abdominal fluid collection consistent with abscess has diminished in size compared to the prior study. 2. Mild small bowel dilatation likely representing ileus. 3. Significant decrease in  pneumoperitoneum.  CXR  IMPRESSION: There are no signs of pulmonary edema or focal pulmonary consolidation.  CT Head IMPRESSION: No CT evidence of intracranial injury.  Imaging independently reviewed and agree with radiologist's interpretation.    Eppie Gibson, MD 10/22/2022, 5:29 PM PGY-2, [place] Intern pager: [phone], text pages welcome Secure chat group [place]

## 2022-10-22 NOTE — Assessment & Plan Note (Signed)
Not on a mood stabilizer at this time, but reports he is well controlled on Sertaline and Buspar. - Continue home sertraline and buspar

## 2022-10-22 NOTE — ED Triage Notes (Addendum)
BIB [facility] from home s/p fall. Dizzy and light headed onset yesterday. NV onset last night into this am. [place] today while attempting dressing change. Denies injury or pain from fall. Denies hitting head. No blood thinners. C/o dizzy, weak. A&Ox4, with malaise. H/o many abd surgery. Last abd surgery 3/15, with revision 3/16. D/c'd on 3/27. Large painful open wound midline abd. Appears infectious with redness, pus, drainage. Denies ABT, d/c'd noot on antibiotics.Open colostomy/ stoma w/o bag. Skin clammy. Alert, NAD, calm, interactive. NSL 20g L hand. NS 250cc given PTA. Tachycardic ST 120, and tachypneic RR 30.

## 2022-10-22 NOTE — Assessment & Plan Note (Signed)
No wheeze on exam. - Continue home Anoro ellipta daily and albuterol as needed

## 2022-10-22 NOTE — Sepsis Progress Note (Signed)
Sepsis protocol monitored by eLink ?

## 2022-10-22 NOTE — Consult Note (Signed)
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  Stoma type/location: LLQ end colostomy  Stomal assessment/size: 2" round, budded, appears edematous and inflamed  Peristomal assessment: mucocutaneous separation measuring 2 cms x 2 cms x 0.8 cms at 9 o'clock, erythema of skin noted circumferential, also induration noted from 7 o'clock to 12 o'clock which patient states is painful  Treatment options for stomal/peristomal skin: Filled area of mucocutaneous separation with alginate, crusted surrounding skin with ostomy powder and no sting barrier film, also used 2" barrier ring  Output: patient had ABD pad covering ostomy when I arrived, greenish brown effluent noted on pad  Ostomy pouching: 4" 2 piece pouch Kellie Simmering [phone])  Education provided: No education provided at this visit.  Patient received education from ostomy team last 3/29 and says he feels comfortable changing pouch at home however was having tremendous difficulty with leaking where mucocutaneous separation is.    Enrolled patient in [place] program: Yes on prior visit   [facility] team will follow patient for further ostomy support.    Thank you,    Shelton Silvas MSN, RN-BC, [employer] [phone]

## 2022-10-22 NOTE — Assessment & Plan Note (Addendum)
WBC now down to 8.7 from 20.1. Patient was most likely hemoconcentrated on arrival. Continues to be afebrile.  Presumed source continues to be abdominal wall abscess (though smaller now than in previous CT) and/or ventral soft tissue surgical site infection due to contamination from stool from poor fitting colostomy bag. Blood cultures have been no growth < 24 hours.  - Continue Cefepime and Flagyl - Follow blood cultures - AM CBC  - General surgery following, appreciate recommendations  - Wound care per general surgery recommendations - Abdominal binder to prevent wound dehiscence - Continue Oxycodone 5 mg q 6 hours prn  - Continue home robaxin

## 2022-10-22 NOTE — ED Provider Notes (Signed)
Timothy [name] Note   CSN: [id number] Arrival date & time: 10/22/22  1030     History  Chief Complaint  Patient presents with   Timothy [name] is a 57 y.o. male.  Patient brought in by EMS.  Patient had a fall at home dizzy lightheaded onset was yesterday.  Nausea and vomiting onset last night.  Patient fell while attempting to change his dressing.  Patient denies any injury or hitting his head with the fall.  Patient not on blood thinners.  Patient had abdominal surgery several times throughout the month of March.  Complicated by wound to heal by secondary intention and intra-abdominal abscess.  And colostomy.  EMS stated that basically patient's colostomy bag does not fit and patient says it will not stay on and his open ventral wound of the abdomen was full of stool.  Which had to be cleaned out.  Patient was noted to be tachycardic with heart rate of 120 and respiratory rate was up at 30 but upon arrival here no fever heart rate was more 117 blood pressure is always good at 154/101 so never hypotensive.  Oxygen saturation is 98%.  Patient has extensive medical history.  Gastroesophageal reflux disease depression history of migraines COPD heavy smoker chronic pain bipolar disorder history of diverticulitis history of colostomy in 2013.  Ventral hernia 2014.  More recently patient had a colostomy takedown in 2014 but then has a new 1 placed here.  Starting in March 2024 patient had a laparotomy irrigation debridement of abscess excision of mesh colon resection lysis of adhesions.  Most recent procedure was March 24.  Surgeon was [employer].  Other surgeons in the past have been Marlou Starks.  On arrival patient not very alert.  But would follow commands and answer questions.       Home Medications Prior to Admission medications   Medication Sig Start Date End Date Taking? Authorizing [name]  albuterol (VENTOLIN HFA) 108 (90 Base)  MCG/ACT inhaler Inhale 2 puffs into the lungs every 6 (six) hours as needed for wheezing or shortness of breath.   Yes [name], Historical, MD  Aspirin-Acetaminophen-Caffeine (GOODY HEADACHE PO) Take 1 packet by mouth 2 (two) times daily as needed (headache, pain).   Yes [name], Historical, MD  busPIRone (BUSPAR) 15 MG tablet TAKE 1 TABLET BY MOUTH IN THE MORNING. Patient taking differently: Take 15 mg by mouth daily. 09/23/22  Yes Dameron, Luna Fuse, DO  gabapentin (NEURONTIN) 100 MG capsule TAKE 1 CAPSULE BY MOUTH FOUR TIMES DAILY AS NEEDED Patient taking differently: Take 100 mg by mouth 4 (four) times daily as needed (Neuropathic pain). 06/24/22  Yes Gerrit Heck, MD  losartan (COZAAR) 100 MG tablet TAKE 1 TABLET (100 MG TOTAL) BY MOUTH DAILY. 09/23/22  Yes Dameron, Luna Fuse, DO  methocarbamol (ROBAXIN) 500 MG tablet Take 1 tablet (500 mg total) by mouth every 8 (eight) hours as needed for muscle spasms. 10/19/22  Yes Michael Boston, MD  oxyCODONE (OXY IR/ROXICODONE) 5 MG immediate release tablet Take 1 tablet (5 mg total) by mouth every 6 (six) hours as needed for moderate pain. 10/19/22  Yes Michael Boston, MD  potassium chloride (KLOR-CON M) 10 MEQ tablet Take 4 tablets (40 mEq total) by mouth daily for 5 days. 10/19/22 10/24/22 Yes Michael Boston, MD  sertraline (ZOLOFT) 100 MG tablet TAKE 1 TABLET (100 MG TOTAL) BY MOUTH DAILY. 08/29/22  Yes Gerrit Heck, MD  benzonatate (TESSALON PERLES) 100 MG capsule Take  1 capsule (100 mg total) by mouth 3 (three) times daily as needed for cough. Patient not taking: Reported on 10/01/2022 09/02/22   Gerrit Heck, MD  ondansetron (ZOFRAN-ODT) 4 MG disintegrating tablet Take 1 tablet (4 mg total) by mouth every 8 (eight) hours as needed for nausea or vomiting. Patient not taking: Reported on 10/22/2022 06/24/22   Gerrit Heck, MD  pantoprazole (PROTONIX) 40 MG tablet TAKE 1 TABLET BY MOUTH DAILY Patient not taking: Reported on 10/22/2022 09/23/22   Dameron, Luna Fuse,  DO  umeclidinium-vilanterol (ANORO ELLIPTA) 62.5-25 MCG/ACT AEPB Inhale 1 puff into the lungs daily. Patient not taking: Reported on 10/01/2022 09/02/22   Gerrit Heck, MD  varenicline (CHANTIX) 0.5 MG tablet Take 1 tablet (0.5 mg total) by mouth 2 (two) times daily with a meal. Once daily for the first week then twice daily Patient not taking: Reported on 08/08/2022 07/01/22   Zenia Resides, MD  lurasidone (LATUDA) 40 MG TABS tablet TAKE 1 TABLET(40 MG) BY MOUTH DAILY WITH BREAKFAST 05/12/19 05/08/20  Nuala Alpha, MD  mometasone-formoterol (DULERA) 100-5 MCG/ACT AERO Inhale 2 puffs into the lungs 2 (two) times daily. 02/22/19 05/19/20  Nuala Alpha, MD      Allergies    Ambien [zolpidem tartrate], Bactroban [mupirocin calcium], Zestril [lisinopril], Darvon [propoxyphene], and Morphine and related    Review of Systems   Review of Systems  Constitutional:  Negative for chills and fever.  HENT:  Negative for ear pain and sore throat.   Eyes:  Negative for pain and visual disturbance.  Respiratory:  Negative for cough and shortness of breath.   Cardiovascular:  Negative for chest pain and palpitations.  Gastrointestinal:  Positive for abdominal pain. Negative for vomiting.  Genitourinary:  Negative for dysuria and hematuria.  Musculoskeletal:  Negative for arthralgias and back pain.  Skin:  Positive for wound. Negative for color change and rash.  Neurological:  Positive for light-headedness. Negative for seizures and syncope.  All other systems reviewed and are negative.   Physical Exam Updated Vital Signs BP 139/84   Pulse (!) 102   Temp 98.8 F (37.1 C) (Oral)   Resp 20   Ht 1.753 m (5\' 9" )   Wt 74.4 kg   SpO2 96%   BMI 24.22 kg/m  Physical Exam Vitals and nursing note reviewed.  Constitutional:      General: He is not in acute distress.    Appearance: He is well-developed. He is ill-appearing.  HENT:     Head: Normocephalic and atraumatic.  Eyes:      Conjunctiva/sclera: Conjunctivae normal.     Pupils: Pupils are equal, round, and reactive to light.  Cardiovascular:     Rate and Rhythm: Regular rhythm. Tachycardia present.     Heart sounds: No murmur heard. Pulmonary:     Effort: Pulmonary effort is normal. No respiratory distress.     Breath sounds: Normal breath sounds.  Abdominal:     Palpations: Abdomen is soft.     Tenderness: There is no abdominal tenderness.     Comments: Patient with colostomy that is moist left side of the abdomen.  Colostomy bag is off.  Open ventral wound measuring about 10 cm.  Prolene sutures at the base are visible.  But no obvious dehiscence.  Musculoskeletal:        General: No swelling.     Cervical back: Normal range of motion and neck supple.  Skin:    General: Skin is warm and dry.  Capillary Refill: Capillary refill takes less than 2 seconds.  Neurological:     General: No focal deficit present.     Mental Status: He is alert.     Comments: Initially some confusion.  No focal deficits.  Psychiatric:        Mood and Affect: Mood normal.     ED Results / Procedures / Treatments   Labs (all labs ordered are listed, but only abnormal results are displayed) Labs Reviewed  COMPREHENSIVE METABOLIC PANEL - Abnormal; Notable for the following components:      Result Value   Glucose, Bld 120 (*)    Calcium 8.1 (*)    Albumin 2.3 (*)    Alkaline Phosphatase 134 (*)    All other components within normal limits  CBC WITH DIFFERENTIAL/PLATELET - Abnormal; Notable for the following components:   WBC 20.1 (*)    RBC 3.75 (*)    Hemoglobin 11.3 (*)    HCT 36.3 (*)    Platelets 1,055 (*)    Neutro Abs 17.3 (*)    Monocytes Absolute 1.1 (*)    Abs Immature Granulocytes 0.18 (*)    All other components within normal limits  URINALYSIS, W/ REFLEX TO CULTURE (INFECTION SUSPECTED) - Abnormal; Notable for the following components:   Bacteria, UA RARE (*)    All other components within normal  limits  I-STAT CHEM 8, ED - Abnormal; Notable for the following components:   Glucose, Bld 123 (*)    Calcium, Ion 1.02 (*)    Hemoglobin 12.6 (*)    HCT 37.0 (*)    All other components within normal limits  RESP PANEL BY RT-PCR (RSV, FLU A&B, COVID)  RVPGX2  CULTURE, BLOOD (ROUTINE X 2)  CULTURE, BLOOD (ROUTINE X 2)  LACTIC ACID, PLASMA  PROTIME-INR  APTT  LACTIC ACID, PLASMA  PATHOLOGIST SMEAR REVIEW    EKG EKG Interpretation  Date/Time:  Tuesday October 22 2022 10:50:20 EDT Ventricular Rate:  112 PR Interval:  125 QRS Duration: 92 QT Interval:  341 QTC Calculation: 466 R Axis:   93 Text Interpretation: Sinus tachycardia Biatrial enlargement Borderline right axis deviation Probable left ventricular hypertrophy Confirmed by Fredia Sorrow [phone]) on 10/22/2022 11:26:16 AM  Radiology CT Abdomen Pelvis W Contrast  Result Date: 10/22/2022 CLINICAL DATA:  Follow up pneumoperitoneum EXAM: CT ABDOMEN AND PELVIS WITH CONTRAST TECHNIQUE: Multidetector CT imaging of the abdomen and pelvis was performed using the standard protocol following bolus administration of intravenous contrast. RADIATION DOSE REDUCTION: This exam was performed according to the departmental dose-optimization program which includes automated exposure control, adjustment of the mA and/or kV according to patient size and/or use of iterative reconstruction technique. CONTRAST:  66mL OMNIPAQUE IOHEXOL 350 MG/ML SOLN COMPARISON:  10/13/2022 FINDINGS: Lower chest: Bibasilar lung nodules largest measuring 1.2 cm on the right. No pericardial or pleural effusion. Hepatobiliary: No focal liver abnormality is seen. No gallstones, gallbladder wall thickening, or biliary dilatation. Pancreas: Unremarkable. No pancreatic ductal dilatation or surrounding inflammatory changes. Spleen: Normal in size without focal abnormality. Adrenals/Urinary Tract: Adrenal glands are unremarkable. Kidneys are normal, without renal calculi, focal lesion,  or hydronephrosis. Bladder is unremarkable. Stomach/Bowel: Mild dilatation of small bowel loops likely an ileus. Left lower quadrant colostomy with evidence of partial left hemicolectomy. Appendix not identified. Vascular/Lymphatic: Aortic atherosclerosis. No enlarged abdominal or pelvic lymph nodes. Reproductive: Prostate is unremarkable. Other: Significant decrease in pneumoperitoneum compared to the prior study. Left lower abdominal collection of fluid with [facility] of air measures 8  cm, significantly smaller than on the prior study. Musculoskeletal: Bilateral L5 spondylolysis. IMPRESSION: 1. Left abdominal fluid collection consistent with abscess has diminished in size compared to the prior study. 2. Mild small bowel dilatation likely representing ileus. 3. Significant decrease in pneumoperitoneum. Electronically Signed   By: Sammie Bench M.D.   On: 10/22/2022 14:49   CT Head Wo Contrast  Result Date: 10/22/2022 CLINICAL DATA:  Head trauma EXAM: CT HEAD WITHOUT CONTRAST TECHNIQUE: Contiguous axial images were obtained from the base of the skull through the vertex without intravenous contrast. RADIATION DOSE REDUCTION: This exam was performed according to the departmental dose-optimization program which includes automated exposure control, adjustment of the mA and/or kV according to patient size and/or use of iterative reconstruction technique. COMPARISON:  CT Head 08/21/12 FINDINGS: Brain: No evidence of acute infarction, hemorrhage, hydrocephalus, extra-axial collection or mass lesion/mass effect. Vascular: No hyperdense vessel or unexpected calcification. Skull: Normal. Negative for fracture or focal lesion. Sinuses/Orbits: Small left mastoid effusion. No middle ear effusion. Mild mucosal thickening right sphenoid sinus and right maxillary sinus orbits are unremarkable Other: None. IMPRESSION: No CT evidence of intracranial injury. Electronically Signed   By: Marin Roberts M.D.   On: 10/22/2022 14:45   DG  Chest 2 View  Result Date: 10/22/2022 CLINICAL DATA:  Possible sepsis, dizziness EXAM: CHEST - 2 VIEW COMPARISON:  10/14/2022 FINDINGS: Cardiac size is within normal limits. There is interval removal of endotracheal tube and NG tube. Lung fields are clear of any infiltrates or pulmonary edema. There is no pleural effusion or pneumothorax. There is significant interval decrease in amount of pneumoperitoneum under the right hemidiaphragm. IMPRESSION: There are no signs of pulmonary edema or focal pulmonary consolidation. There is significant interval decrease in amount of pneumoperitoneum under the right hemidiaphragm in comparison with the study of 10/14/2022. Electronically Signed   By: Elmer Picker M.D.   On: 10/22/2022 11:32    Procedures Procedures    Medications Ordered in ED Medications  lactated ringers infusion ( Intravenous New Bag/Given 10/22/22 1447)  ceFEPIme (MAXIPIME) 2 g in sodium chloride 0.9 % 100 mL IVPB (has no administration in time range)  lactated ringers bolus 1,000 mL (0 mLs Intravenous Stopped 10/22/22 1349)    And  lactated ringers bolus 1,000 mL (0 mLs Intravenous Stopped 10/22/22 1349)    And  lactated ringers bolus 250 mL (0 mLs Intravenous Stopped 10/22/22 1447)  ceFEPIme (MAXIPIME) 2 g in sodium chloride 0.9 % 100 mL IVPB (0 g Intravenous Stopped 10/22/22 1349)  metroNIDAZOLE (FLAGYL) IVPB 500 mg (0 mg Intravenous Stopped 10/22/22 1349)  ondansetron (ZOFRAN) injection 4 mg (4 mg Intravenous Given 10/22/22 1237)  HYDROmorphone (DILAUDID) injection 1 mg (1 mg Intravenous Given 10/22/22 1237)  iohexol (OMNIPAQUE) 350 MG/ML injection 75 mL (75 mLs Intravenous Contrast Given 10/22/22 1438)  HYDROmorphone (DILAUDID) injection 1 mg (1 mg Intravenous Given 10/22/22 1500)    ED Course/ Medical Decision Making/ A&P                             Medical Decision Making Amount and/or Complexity of Data Reviewed Labs: ordered. Radiology: ordered. ECG/medicine tests:  ordered.  Risk Prescription drug management. Decision regarding hospitalization.  With a history of the abdominal abscess and him appearing tachycardic and tachypneic initially did initiate sepsis workup.  Started on broad-spectrum antibiotics for intra-abdominal infection.  Sepsis order set initiated.  Urinalysis negative respiratory panel negative complete metabolic panel alk phos  134 renal function normal with GFR 60.  Potassium normal.  CO2 normal no anion gap.  Lactic acid not elevated at 1.9.  White count elevated at 20,000 hemoglobin 11.6 and platelets has had elevated place before but now they are 10055.  Head CTA done because of the fall no acute findings.  CT abdomen and pelvis left abdominal fluid collection consistent with abscess is diminished in size compared to the prior study mild small bowel dilatation likely ileus.  No evidence of any bowel obstruction.  Discussed with general surgery PA on-call who will talk to Dr. Marlou Starks.  They wanted medical admission.  Patient followed by family medicine center here they were contacted for admission.  Patient showing improvement with fluids and antibiotics.  General surgery thought that they may get IR involved to go ahead and drain the abscess.  CRITICAL CARE Performed by: Fredia Sorrow Total critical care time: 45 minutes Critical care time was exclusive of separately billable procedures and treating other patients. Critical care was necessary to treat or prevent imminent or life-threatening deterioration. Critical care was time spent personally by me on the following activities: development of treatment plan with patient and/or surrogate as well as nursing, discussions with consultants, evaluation of patient's response to treatment, examination of patient, obtaining history from patient or surrogate, ordering and performing treatments and interventions, ordering and review of laboratory studies, ordering and review of radiographic  studies, pulse oximetry and re-evaluation of patient's condition.     Final Clinical Impression(s) / ED Diagnoses Final diagnoses:  Sepsis, due to unspecified organism, unspecified whether acute organ dysfunction present  Intra-abdominal abscess    Rx / DC Orders ED Discharge Orders     None         Fredia Sorrow, MD 10/22/22 1654

## 2022-10-22 NOTE — Assessment & Plan Note (Addendum)
Platelets increased to 1055. Has been high in the past, but generally in the 500-600s. Also note that this does seem relatively new, within the past year or so. Suppose this elevation could represent some hemoconcentration given he was quite dry on presentation, but likely also represents elevation in the setting of an acute on chronic infectious process. Could also be related to a myeloproliferative disorder or paraneoplastic in the setting of new lung nodules as discussed above. - Pathologist smear review - Trend on CBC  - CT Chest as Above

## 2022-10-22 NOTE — Assessment & Plan Note (Addendum)
Mention of bibasilar lung nodules up to 1.2cm on the Right. Note that this is a new finding when compared to CT scan from just four months ago. With him reporting shortness of breath and new thrombocytosis, certainly could represent a new lung cancer in patient with heavy smoking history. Finding is consistent on CT Chest.  - Follow up with 3 month CT outpatient.

## 2022-10-22 NOTE — ED Notes (Signed)
ED TO INPATIENT HANDOFF REPORT  ED Nurse Name and Phone #: Iona Coach Name/Age/Gender Timothy [name] 57 y.o. male Room/Bed: 015C/015C  Code Status   Code Status: Prior  Home/SNF/Other Home Patient oriented to: self, place, time, and situation Is this baseline? Yes   Triage Complete: Triage complete  Chief Complaint Abdominal wall abscess [L02.211]  Triage Note BIB [facility] from home s/p fall. Dizzy and light headed onset yesterday. NV onset last night into this am. [place] today while attempting dressing change. Denies injury or pain from fall. Denies hitting head. No blood thinners. C/o dizzy, weak. A&Ox4, with malaise. H/o many abd surgery. Last abd surgery 3/15, with revision 3/16. D/c'd on 3/27. Large painful open wound midline abd. Appears infectious with redness, pus, drainage. Denies ABT, d/c'd noot on antibiotics.Open colostomy/ stoma w/o bag. Skin clammy. Alert, NAD, calm, interactive. NSL 20g L hand. NS 250cc given PTA. Tachycardic ST 120, and tachypneic RR 30.    Allergies Allergies  Allergen Reactions   Ambien [Zolpidem Tartrate] Other (See Comments)    Agitation   Bactroban [Mupirocin Calcium] Swelling    Caused infection and swelling at a surgical site   Zestril [Lisinopril] Cough   Darvon [Propoxyphene] Itching   Morphine And Related Itching    Level of Care/Admitting Diagnosis ED Disposition     ED Disposition  Admit   Condition  --   Comment  Hospital Area: [place] [facility] [[id number]]  Level of Care: Med-Surg [[id number]]  May place patient in observation at [facility] or [facility] if equivalent level of care is available:: No  Covid Evaluation: Asymptomatic - no recent exposure (last 10 days) testing not required  Diagnosis: Abdominal wall abscess [id number]  Admitting Physician: Lowry Ram [id number]  Attending Physician: Lind Covert [[id number]          B Medical/Surgery History Past Medical History:  Diagnosis Date   Acute bronchitis  02/15/2013   Alcohol abuse    Anxiety    Bipolar disorder    Bronchitis    history   Bronchitis    Cellulitis    - left knee-2009   Chronic pain    Complication of anesthesia    Condyloma 02/04/2012   Depression    Diverticulosis    By colonoscopy June 2005   Dyspnea    on exertion at times   Elevated CK 09/2011   Emphysema lung    Emphysema of lung    Family history of anesthesia complication    Mother N/V   GERD (gastroesophageal reflux disease)    Glaucoma syndrome    does not use eye drops, "They burn".   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 Presumably secondary to diverticulosis.   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   Ventral hernia 01/11/2013  Repaired with mesh 05/20/13   Wrist pain 01/26/2019   Past Surgical History:  Procedure Laterality Date   ABDOMINAL EXPLORATION SURGERY  07/23/2012   w/LOA (07/23/2012)   [place] RESECTION  02/27/2012   Procedure: COLON RESECTION;  Surgeon: Merrie Roof, MD;  Location: [place];  Service: General;  Laterality: N/A;   COLON RESECTION N/A 10/04/2022   Procedure: SEGMENTAL COLON RESECTION;  Surgeon: Jovita Kussmaul, MD;  Location: [place];  Service: General;  Laterality: N/A;   COLONOSCOPY  10/13/2022   Procedure: RESECTION OF COLON ANASTOMOSIS AND END COLOSCOPY;  Surgeon:  Mickeal Skinner, MD;  Location: [place];  Service: General;;   COLOSTOMY  02/27/2012   Procedure: COLOSTOMY;  Surgeon: Merrie Roof, MD;  Location: [place];  Service: General;  Laterality: N/A;   COLOSTOMY TAKEDOWN  07/23/2012   COLOSTOMY TAKEDOWN  07/23/2012   Procedure: COLOSTOMY TAKEDOWN;  Surgeon: Merrie Roof, MD;  Location: [place];  Service: General;  Laterality: N/A;  Primary Anastomosis   ELBOW FRACTURE SURGERY  ~ 1975   left;  pins inserted    EXCISION OF MESH N/A 10/04/2022   Procedure: REMOVAL OF MESH;  Surgeon: Jovita Kussmaul, MD;  Location: [place];  Service: General;  Laterality: N/A;   INCISION AND DRAINAGE ABSCESS N/A 04/05/2021   Procedure: INCISION AND DRAINAGE ABDOMINAL WALL ABSCESS;  Surgeon: Jovita Kussmaul, MD;  Location: [place];  Service: General;  Laterality: N/A;   INSERTION OF MESH N/A 05/20/2013   Procedure: INSERTION OF MESH;  Surgeon: Merrie Roof, MD;  Location: [place];  Service: General;  Laterality: N/A;   [place] N/A 10/04/2022   Procedure: DRAINAGE ABDOMIAL WALL ABSCESS;  Surgeon: Jovita Kussmaul, MD;  Location: [place];  Service: General;  Laterality: N/A;   LAPAROSCOPIC LEFT COLON RESECTION  02/19/2012   SIGMOID   LAPAROTOMY  02/27/2012   Procedure: EXPLORATORY LAPAROTOMY;  Surgeon: Merrie Roof, MD;  Location: [place];  Service: General;  Laterality: N/A;   LAPAROTOMY  07/23/2012   Procedure: EXPLORATORY LAPAROTOMY;  Surgeon: Merrie Roof, MD;  Location: [place];  Service: General;  Laterality: N/A;   LAPAROTOMY N/A 10/04/2022   Procedure: EXPLORATORY LAPAROTOMY;  Surgeon: Jovita Kussmaul, MD;  Location: [place];  Service: General;  Laterality: N/A;   LAPAROTOMY N/A 10/13/2022   Procedure: EXPLORATORY LAPAROTOMY;  Surgeon: Kieth Brightly Arta Bruce, MD;  Location: [place];  Service: General;  Laterality: N/A;   LESION DESTRUCTION  07/23/2012   Procedure: DESTRUCTION LESION ANUS;  Surgeon: Merrie Roof, MD;  Location: [place];  Service: General;   Laterality: N/A;  DESTRUCTION ANAL CONDYLOMA   LYSIS OF ADHESION  07/23/2012   Procedure: LYSIS OF ADHESION;  Surgeon: Merrie Roof, MD;  Location: [place];  Service: General;  Laterality: N/A;   LYSIS OF ADHESION N/A 10/04/2022   Procedure: LYSIS OF ADHESION;  Surgeon: Jovita Kussmaul, MD;  Location: [place];  Service: General;  Laterality: N/A;   VENTRAL HERNIA REPAIR  05/20/2013   Dr Marlon Pel HERNIA REPAIR N/A 05/20/2013   Procedure: VENTRAL HERNIA REPAIR ;  Surgeon: Merrie Roof, MD;  Location: [place];  Service: General;  Laterality: N/A;   WART FULGURATION  02/19/2012   Procedure: FULGURATION ANAL WART;  Surgeon: Merrie Roof, MD;  Location: [place];  Service: General;  Laterality:  N/A;  Destroy  Anal Condyloma    WISDOM TOOTH EXTRACTION  ~ [place] N/A 06/03/2016   Procedure: ABDOMINAL WOUND EXPLORATION AND STITCH REMOVAL;  Surgeon: [name], MD;  Location: [place];  Service: General;  Laterality: N/A;  ABDOMINAL WOUND EXPLORATION AND STITCH REMOVAL     A IV Location/Drains/Wounds Patient Lines/Drains/Airways Status     Active Line/Drains/Airways     Name Placement date Placement time Site Days   Peripheral IV 10/22/22 20 G Left;Posterior Hand 10/22/22  1020  Hand  less than 1   Peripheral IV 10/22/22 20 G Left;Posterior Forearm 10/22/22  1050  Forearm  less than 1   Colostomy LLQ 10/13/22  2337  LLQ  9            Intake/Output Last 24 hours  Intake/Output Summary (Last 24 hours) at 10/22/2022 1603 Last data filed at 10/22/2022 1447 Gross per 24 hour  Intake 2250 ml  Output --  Net 2250 ml    Labs/Imaging Results for orders placed or performed during the hospital encounter of 10/22/22 (from the past 48 hour(s))  Comprehensive metabolic panel     Status: Abnormal   Collection Time: 10/22/22 10:48 AM  Result Value Ref Range   Sodium 138 135 - 145 mmol/L   Potassium 3.7 3.5 - 5.1 mmol/L   Chloride 103 98 - 111 mmol/L   CO2 22  22 - 32 mmol/L   Glucose, Bld 120 (H) 70 - 99 mg/dL    Comment: Glucose reference range applies only to samples taken after fasting for at least 8 hours.   BUN 10 6 - 20 mg/dL   Creatinine, Ser 0.90 0.61 - 1.24 mg/dL   Calcium 8.1 (L) 8.9 - 10.3 mg/dL   Total Protein 7.1 6.5 - 8.1 g/dL   Albumin 2.3 (L) 3.5 - 5.0 g/dL   AST 26 15 - 41 U/L   ALT 23 0 - 44 U/L   Alkaline Phosphatase 134 (H) 38 - 126 U/L   Total Bilirubin 0.5 0.3 - 1.2 mg/dL   GFR, Estimated >60 >60 mL/min    Comment: (NOTE) Calculated using the CKD-EPI Creatinine Equation (2021)    Anion gap 13 5 - 15    Comment: Performed at [facility], [place] [address] [place], [place] [address]  Lactic acid, plasma     Status: None   Collection Time: 10/22/22 10:48 AM  Result Value Ref Range   Lactic Acid, Venous 1.9 0.5 - 1.9 mmol/L    Comment: Performed at [facility] [address], [place], [place] [address]  CBC with Differential     Status: Abnormal   Collection Time: 10/22/22 10:48 AM  Result Value Ref Range   WBC 20.1 (H) 4.0 - 10.5 K/uL   RBC 3.75 (L) 4.22 - 5.81 MIL/uL   Hemoglobin 11.3 (L) 13.0 - 17.0 g/dL   HCT 36.3 (L) 39.0 - 52.0 %   MCV 96.8 80.0 - 100.0 fL   MCH 30.1 26.0 - 34.0 pg   MCHC 31.1 30.0 - 36.0 g/dL   RDW 14.9 11.5 - 15.5 %   Platelets 1,055 (HH) 150 - 400 K/uL    Comment: REPEATED TO VERIFY PLATELET COUNT CONFIRMED BY SMEAR THIS CRITICAL RESULT HAS VERIFIED AND BEEN CALLED TO JOSH Jameel Quant RN. BY TAMEECO CALDWELL ON 04 02 2024 AT 1154, AND HAS BEEN READ BACK.     nRBC 0.0 0.0 - 0.2 %  Neutrophils Relative % 86 %   Neutro Abs 17.3 (H) 1.7 - 7.7 K/uL   Lymphocytes Relative 6 %   Lymphs Abs 1.2 0.7 - 4.0 K/uL   Monocytes Relative 6 %   Monocytes Absolute 1.1 (H) 0.1 - 1.0 K/uL   Eosinophils Relative 1 %   Eosinophils Absolute 0.1 0.0 - 0.5 K/uL   Basophils Relative 0 %   Basophils Absolute 0.1 0.0 - 0.1 K/uL   Immature Granulocytes 1 %   Abs Immature Granulocytes 0.18 (H) 0.00  - 0.07 K/uL    Comment: Performed at [facility] [address] [place], [place] [address]  Protime-INR     Status: None   Collection Time: 10/22/22 10:48 AM  Result Value Ref Range   Prothrombin Time 13.2 11.4 - 15.2 seconds   INR 1.0 0.8 - 1.2    Comment: (NOTE) INR goal varies based on device and disease states. Performed at [facility], [place] [address] [place], [place] [address]   I-stat chem 8, ED (not at [facility], [facility] or [facility])     Status: Abnormal   Collection Time: 10/22/22 10:55 AM  Result Value Ref Range   Sodium 140 135 - 145 mmol/L   Potassium 3.7 3.5 - 5.1 mmol/L   Chloride 104 98 - 111 mmol/L   BUN 12 6 - 20 mg/dL   Creatinine, Ser 0.90 0.61 - 1.24 mg/dL   Glucose, Bld 123 (H) 70 - 99 mg/dL    Comment: Glucose reference range applies only to samples taken after fasting for at least 8 hours.   Calcium, Ion 1.02 (L) 1.15 - 1.40 mmol/L   TCO2 24 22 - 32 mmol/L   Hemoglobin 12.6 (L) 13.0 - 17.0 g/dL   HCT 37.0 (L) 39.0 - 52.0 %  APTT     Status: None   Collection Time: 10/22/22 10:59 AM  Result Value Ref Range   aPTT 29 24 - 36 seconds    Comment: Performed at [facility] [address], [place], [place] [address]  Resp panel by RT-PCR (RSV, Flu A&B, Covid) Anterior Nasal Swab     Status: None   Collection Time: 10/22/22  1:36 PM   Specimen: Anterior Nasal Swab  Result Value Ref Range   SARS Coronavirus 2 by RT PCR NEGATIVE NEGATIVE   Influenza A by PCR NEGATIVE NEGATIVE   Influenza B by PCR NEGATIVE NEGATIVE    Comment: (NOTE) The Xpert Xpress SARS-CoV-2/FLU/RSV plus assay is intended as an aid in the diagnosis of influenza from Nasopharyngeal swab specimens and should not be used as a sole basis for treatment. Nasal washings and aspirates are unacceptable for Xpert Xpress SARS-CoV-2/FLU/RSV testing.  Fact Sheet for Patients: EntrepreneurPulse.com.au  Fact Sheet for Healthcare  Providers: IncredibleEmployment.be  This test is not yet approved or cleared by the [place] FDA and has been authorized for detection and/or diagnosis of SARS-CoV-2 by FDA under an Emergency Use Authorization (EUA). This EUA will remain in effect (meaning this test can be used) for the duration of the COVID-19 declaration under Section 564(b)(1) of the Act, 21 U.S.C. section 360bbb-3(b)(1), unless the authorization is terminated or revoked.     Resp Syncytial Virus by PCR NEGATIVE NEGATIVE    Comment: (NOTE) Fact Sheet for Patients: EntrepreneurPulse.com.au  Fact Sheet for Healthcare Providers: IncredibleEmployment.be  This test is not yet approved or cleared by the [place] FDA and has been authorized for detection and/or diagnosis of SARS-CoV-2 by  FDA under an Emergency Use Authorization (EUA). This EUA will remain in effect (meaning this test can be used) for the duration of the COVID-19 declaration under Section 564(b)(1) of the Act, 21 U.S.C. section 360bbb-3(b)(1), unless the authorization is terminated or revoked.  Performed at [facility], [place] [address], [place], [place] [address]   Urinalysis, w/ Reflex to Culture (Infection Suspected) -Urine, Clean Catch     Status: Abnormal   Collection Time: 10/22/22  2:29 PM  Result Value Ref Range   Specimen Source URINE, CLEAN CATCH    Color, Urine YELLOW YELLOW   APPearance CLEAR CLEAR   Specific Gravity, Urine 1.012 1.005 - 1.030   pH 6.0 5.0 - 8.0   Glucose, UA NEGATIVE NEGATIVE mg/dL   Hgb urine dipstick NEGATIVE NEGATIVE   Bilirubin Urine NEGATIVE NEGATIVE   Ketones, ur NEGATIVE NEGATIVE mg/dL   Protein, ur NEGATIVE NEGATIVE mg/dL   Nitrite NEGATIVE NEGATIVE   Leukocytes,Ua NEGATIVE NEGATIVE   RBC / HPF 0-5 0 - 5 RBC/hpf   WBC, UA 0-5 0 - 5 WBC/hpf    Comment:        Reflex urine culture not performed if WBC <=10, OR if Squamous  epithelial cells >5. If Squamous epithelial cells >5 suggest recollection.    Bacteria, UA RARE (A) NONE SEEN   Squamous Epithelial / HPF 0-5 0 - 5 /HPF    Comment: Performed at [facility], [place] [address], [place], [place] [address]   *Note: Due to a large number of results and/or encounters for the requested time period, some results have not been displayed. A complete set of results can be found in Results Review.   CT Abdomen Pelvis W Contrast  Result Date: 10/22/2022 CLINICAL DATA:  Follow up pneumoperitoneum EXAM: CT ABDOMEN AND PELVIS WITH CONTRAST TECHNIQUE: Multidetector CT imaging of the abdomen and pelvis was performed using the standard protocol following bolus administration of intravenous contrast. RADIATION DOSE REDUCTION: This exam was performed according to the departmental dose-optimization program which includes automated exposure control, adjustment of the mA and/or kV according to patient size and/or use of iterative reconstruction technique. CONTRAST:  28mL OMNIPAQUE IOHEXOL 350 MG/ML SOLN COMPARISON:  10/13/2022 FINDINGS: Lower chest: Bibasilar lung nodules largest measuring 1.2 cm on the right. No pericardial or pleural effusion. Hepatobiliary: No focal liver abnormality is seen. No gallstones, gallbladder wall thickening, or biliary dilatation. Pancreas: Unremarkable. No pancreatic ductal dilatation or surrounding inflammatory changes. Spleen: Normal in size without focal abnormality. Adrenals/Urinary Tract: Adrenal glands are unremarkable. Kidneys are normal, without renal calculi, focal lesion, or hydronephrosis. Bladder is unremarkable. Stomach/Bowel: Mild dilatation of small bowel loops likely an ileus. Left lower quadrant colostomy with evidence of partial left hemicolectomy. Appendix not identified. Vascular/Lymphatic: Aortic atherosclerosis. No enlarged abdominal or pelvic lymph nodes. Reproductive: Prostate is unremarkable. Other: Significant decrease in  pneumoperitoneum compared to the prior study. Left lower abdominal collection of fluid with [facility] of air measures 8 cm, significantly smaller than on the prior study. Musculoskeletal: Bilateral L5 spondylolysis. IMPRESSION: 1. Left abdominal fluid collection consistent with abscess has diminished in size compared to the prior study. 2. Mild small bowel dilatation likely representing ileus. 3. Significant decrease in pneumoperitoneum. Electronically Signed   By: Sammie Bench M.D.   On: 10/22/2022 14:49   CT Head Wo Contrast  Result Date: 10/22/2022 CLINICAL DATA:  Head trauma EXAM: CT HEAD WITHOUT CONTRAST TECHNIQUE: Contiguous axial images were obtained from the base of the skull through the  vertex without intravenous contrast. RADIATION DOSE REDUCTION: This exam was performed according to the departmental dose-optimization program which includes automated exposure control, adjustment of the mA and/or kV according to patient size and/or use of iterative reconstruction technique. COMPARISON:  CT Head 08/21/12 FINDINGS: Brain: No evidence of acute infarction, hemorrhage, hydrocephalus, extra-axial collection or mass lesion/mass effect. Vascular: No hyperdense vessel or unexpected calcification. Skull: Normal. Negative for fracture or focal lesion. Sinuses/Orbits: Small left mastoid effusion. No middle ear effusion. Mild mucosal thickening right sphenoid sinus and right maxillary sinus orbits are unremarkable Other: None. IMPRESSION: No CT evidence of intracranial injury. Electronically Signed   By: Marin Roberts M.D.   On: 10/22/2022 14:45   DG Chest 2 View  Result Date: 10/22/2022 CLINICAL DATA:  Possible sepsis, dizziness EXAM: CHEST - 2 VIEW COMPARISON:  10/14/2022 FINDINGS: Cardiac size is within normal limits. There is interval removal of endotracheal tube and NG tube. Lung fields are clear of any infiltrates or pulmonary edema. There is no pleural effusion or pneumothorax. There is significant interval  decrease in amount of pneumoperitoneum under the right hemidiaphragm. IMPRESSION: There are no signs of pulmonary edema or focal pulmonary consolidation. There is significant interval decrease in amount of pneumoperitoneum under the right hemidiaphragm in comparison with the study of 10/14/2022. Electronically Signed   By: Elmer Picker M.D.   On: 10/22/2022 11:32    Pending Labs Unresulted Labs (From admission, onward)     Start     Ordered   10/22/22 1048  Lactic acid, plasma  Now then every 2 hours,   R (with STAT occurrences)      10/22/22 1048   10/22/22 1048  Culture, blood (Routine x 2)  BLOOD CULTURE X 2,   R (with STAT occurrences)      10/22/22 1048   10/22/22 1048  Pathologist smear review  Once,   AD        10/22/22 1048            Vitals/Pain Today's Vitals   10/22/22 1043 10/22/22 1400 10/22/22 1411 10/22/22 1449  BP:  139/84    Pulse:  (!) 102    Resp:  20    Temp:   98.8 F (37.1 C)   TempSrc:   Oral   SpO2:  96%    Weight: 74.4 kg     Height: 5\' 9"  (1.753 m)     PainSc:  4   5     Isolation Precautions No active isolations  Medications Medications  lactated ringers infusion ( Intravenous New Bag/Given 10/22/22 1447)  ceFEPIme (MAXIPIME) 2 g in sodium chloride 0.9 % 100 mL IVPB (has no administration in time range)  HYDROmorphone (DILAUDID) injection 1 mg (has no administration in time range)  lactated ringers bolus 1,000 mL (0 mLs Intravenous Stopped 10/22/22 1349)    And  lactated ringers bolus 1,000 mL (0 mLs Intravenous Stopped 10/22/22 1349)    And  lactated ringers bolus 250 mL (0 mLs Intravenous Stopped 10/22/22 1447)  ceFEPIme (MAXIPIME) 2 g in sodium chloride 0.9 % 100 mL IVPB (0 g Intravenous Stopped 10/22/22 1349)  metroNIDAZOLE (FLAGYL) IVPB 500 mg (0 mg Intravenous Stopped 10/22/22 1349)  ondansetron (ZOFRAN) injection 4 mg (4 mg Intravenous Given 10/22/22 1237)  HYDROmorphone (DILAUDID) injection 1 mg (1 mg Intravenous Given 10/22/22 1237)   iohexol (OMNIPAQUE) 350 MG/ML injection 75 mL (75 mLs Intravenous Contrast Given 10/22/22 1438)    Mobility walks     Focused Assessments  R Recommendations: See Admitting Provider Note  Report given to:   Additional Notes: Pt is NPO and has only had a swab while here

## 2022-10-23 DIAGNOSIS — G43909 Migraine, unspecified, not intractable, without status migrainosus: Secondary | ICD-10-CM | POA: Diagnosis present

## 2022-10-23 DIAGNOSIS — L02211 Cutaneous abscess of abdominal wall: Secondary | ICD-10-CM

## 2022-10-23 DIAGNOSIS — F17210 Nicotine dependence, cigarettes, uncomplicated: Secondary | ICD-10-CM | POA: Diagnosis present

## 2022-10-23 DIAGNOSIS — D500 Iron deficiency anemia secondary to blood loss (chronic): Secondary | ICD-10-CM | POA: Diagnosis present

## 2022-10-23 DIAGNOSIS — J439 Emphysema, unspecified: Secondary | ICD-10-CM | POA: Diagnosis present

## 2022-10-23 DIAGNOSIS — K651 Peritoneal abscess: Secondary | ICD-10-CM | POA: Diagnosis not present

## 2022-10-23 DIAGNOSIS — E861 Hypovolemia: Secondary | ICD-10-CM | POA: Diagnosis present

## 2022-10-23 DIAGNOSIS — R911 Solitary pulmonary nodule: Secondary | ICD-10-CM | POA: Diagnosis present

## 2022-10-23 DIAGNOSIS — K219 Gastro-esophageal reflux disease without esophagitis: Secondary | ICD-10-CM | POA: Diagnosis present

## 2022-10-23 DIAGNOSIS — A419 Sepsis, unspecified organism: Secondary | ICD-10-CM | POA: Diagnosis present

## 2022-10-23 DIAGNOSIS — H409 Unspecified glaucoma: Secondary | ICD-10-CM | POA: Diagnosis present

## 2022-10-23 DIAGNOSIS — Z885 Allergy status to narcotic agent status: Secondary | ICD-10-CM | POA: Diagnosis not present

## 2022-10-23 DIAGNOSIS — Z933 Colostomy status: Secondary | ICD-10-CM | POA: Diagnosis not present

## 2022-10-23 DIAGNOSIS — F1011 Alcohol abuse, in remission: Secondary | ICD-10-CM | POA: Diagnosis present

## 2022-10-23 DIAGNOSIS — K5780 Diverticulitis of intestine, part unspecified, with perforation and abscess without bleeding: Secondary | ICD-10-CM | POA: Diagnosis present

## 2022-10-23 DIAGNOSIS — D638 Anemia in other chronic diseases classified elsewhere: Secondary | ICD-10-CM | POA: Diagnosis present

## 2022-10-23 DIAGNOSIS — T8143XA Infection following a procedure, organ and space surgical site, initial encounter: Secondary | ICD-10-CM | POA: Diagnosis present

## 2022-10-23 DIAGNOSIS — F419 Anxiety disorder, unspecified: Secondary | ICD-10-CM | POA: Diagnosis present

## 2022-10-23 DIAGNOSIS — S31109D Unspecified open wound of abdominal wall, unspecified quadrant without penetration into peritoneal cavity, subsequent encounter: Secondary | ICD-10-CM | POA: Diagnosis not present

## 2022-10-23 DIAGNOSIS — Z8249 Family history of ischemic heart disease and other diseases of the circulatory system: Secondary | ICD-10-CM | POA: Diagnosis not present

## 2022-10-23 DIAGNOSIS — Z560 Unemployment, unspecified: Secondary | ICD-10-CM | POA: Diagnosis not present

## 2022-10-23 DIAGNOSIS — L089 Local infection of the skin and subcutaneous tissue, unspecified: Secondary | ICD-10-CM | POA: Diagnosis not present

## 2022-10-23 DIAGNOSIS — T8130XA Disruption of wound, unspecified, initial encounter: Secondary | ICD-10-CM | POA: Diagnosis present

## 2022-10-23 DIAGNOSIS — Z1152 Encounter for screening for COVID-19: Secondary | ICD-10-CM | POA: Diagnosis not present

## 2022-10-23 DIAGNOSIS — Z881 Allergy status to other antibiotic agents status: Secondary | ICD-10-CM | POA: Diagnosis not present

## 2022-10-23 DIAGNOSIS — D75839 Thrombocytosis, unspecified: Secondary | ICD-10-CM | POA: Diagnosis present

## 2022-10-23 DIAGNOSIS — F319 Bipolar disorder, unspecified: Secondary | ICD-10-CM | POA: Diagnosis present

## 2022-10-23 DIAGNOSIS — I10 Essential (primary) hypertension: Secondary | ICD-10-CM | POA: Diagnosis present

## 2022-10-23 HISTORY — DX: Cutaneous abscess of abdominal wall: L02.211

## 2022-10-23 LAB — CBC
HCT: 25.8 % — ABNORMAL LOW (ref 39.0–52.0)
Hemoglobin: 8.3 g/dL — ABNORMAL LOW (ref 13.0–17.0)
MCH: 30.5 pg (ref 26.0–34.0)
MCHC: 32.2 g/dL (ref 30.0–36.0)
MCV: 94.9 fL (ref 80.0–100.0)
Platelets: 747 10*3/uL — ABNORMAL HIGH (ref 150–400)
RBC: 2.72 MIL/uL — ABNORMAL LOW (ref 4.22–5.81)
RDW: 15.1 % (ref 11.5–15.5)
WBC: 8.7 10*3/uL (ref 4.0–10.5)
nRBC: 0 % (ref 0.0–0.2)

## 2022-10-23 LAB — PATHOLOGIST SMEAR REVIEW

## 2022-10-23 LAB — RETICULOCYTES
Immature Retic Fract: 19.7 % — ABNORMAL HIGH (ref 2.3–15.9)
RBC.: 2.94 MIL/uL — ABNORMAL LOW (ref 4.22–5.81)
Retic Count, Absolute: 71 10*3/uL (ref 19.0–186.0)
Retic Ct Pct: 2.3 % (ref 0.4–3.1)

## 2022-10-23 LAB — HEMOGLOBIN AND HEMATOCRIT, BLOOD
HCT: 28.7 % — ABNORMAL LOW (ref 39.0–52.0)
Hemoglobin: 8.9 g/dL — ABNORMAL LOW (ref 13.0–17.0)

## 2022-10-23 LAB — HIV ANTIBODY (ROUTINE TESTING W REFLEX): HIV Screen 4th Generation wRfx: NONREACTIVE

## 2022-10-23 LAB — IRON AND TIBC
Iron: 31 ug/dL — ABNORMAL LOW (ref 45–182)
Saturation Ratios: 11 % — ABNORMAL LOW (ref 17.9–39.5)
TIBC: 287 ug/dL (ref 250–450)
UIBC: 256 ug/dL

## 2022-10-23 LAB — FOLATE: Folate: 10.1 ng/mL (ref >5.9)

## 2022-10-23 LAB — FERRITIN: Ferritin: 163 ng/mL (ref 24–336)

## 2022-10-23 LAB — VITAMIN B12: Vitamin B-12: 293 pg/mL (ref 180–914)

## 2022-10-23 MED ORDER — OXYCODONE HCL 5 MG PO TABS
5.0000 mg | ORAL_TABLET | Freq: Four times a day (QID) | ORAL | Status: DC | PRN
  Administered 2022-10-23: 5 mg via ORAL
  Filled 2022-10-23: qty 1

## 2022-10-23 MED ORDER — METHOCARBAMOL 500 MG PO TABS
1000.0000 mg | ORAL_TABLET | Freq: Four times a day (QID) | ORAL | Status: DC | PRN
  Administered 2022-10-23 – 2022-10-24 (×4): 1000 mg via ORAL
  Filled 2022-10-23 (×5): qty 2

## 2022-10-23 MED ORDER — ACETAMINOPHEN 500 MG PO TABS
1000.0000 mg | ORAL_TABLET | Freq: Four times a day (QID) | ORAL | Status: DC
  Administered 2022-10-23 – 2022-10-24 (×6): 1000 mg via ORAL
  Filled 2022-10-23 (×7): qty 2

## 2022-10-23 MED ORDER — OXYCODONE HCL 5 MG PO TABS
5.0000 mg | ORAL_TABLET | Freq: Four times a day (QID) | ORAL | Status: DC | PRN
  Administered 2022-10-23 – 2022-10-24 (×3): 5 mg via ORAL
  Filled 2022-10-23 (×3): qty 1

## 2022-10-23 MED ORDER — METRONIDAZOLE 500 MG/100ML IV SOLN
500.0000 mg | Freq: Two times a day (BID) | INTRAVENOUS | Status: DC
  Administered 2022-10-23 – 2022-10-24 (×3): 500 mg via INTRAVENOUS
  Filled 2022-10-23 (×3): qty 100

## 2022-10-23 NOTE — Progress Notes (Signed)
     Daily Progress Note Intern Pager: [phone]  Patient name: Timothy [name] Medical record number: [id number] Date of birth: Jun 17, 1966 Age: 57 y.o. Gender: male  Primary Care Provider: Gerrit Heck, MD Consultants: General Surgery  Code Status: Full   Pt Overview and Major Events to Date:  4/2 - Admitted, started on Cefepime and Flagyl   Assessment and Plan: Erman Ikner is a 57 y.o. male who presented with dizziness in the setting of vomiting. Found to meet sepsis criteria most likely due to abdominal wall abscess or abdominal soft tissue site infection.   * Sepsis WBC now down to 8.7 from 20.1. Patient was most likely hemoconcentrated on arrival. Continues to be afebrile.  Presumed source continues to be abdominal wall abscess (though smaller now than in previous CT) and/or ventral soft tissue surgical site infection due to contamination from stool from poor fitting colostomy bag. Blood cultures have been no growth < 24 hours.  - Continue Cefepime and Flagyl - Follow blood cultures - AM CBC  - General surgery following, appreciate recommendations  - Wound care per general surgery recommendations - Abdominal binder to prevent wound dehiscence - Continue Oxycodone 5 mg q 6 hours prn  - Continue home robaxin   Lung nodule seen on imaging study Mention of bibasilar lung nodules up to 1.2cm on the Right. Note that this is a new finding when compared to CT scan from just four months ago. With him reporting shortness of breath and new thrombocytosis, certainly could represent a new lung cancer in patient with heavy smoking history. Finding is consistent on CT Chest.  - Consider reaching out to Pulmonology for recommendations for follow up and evaluation   Thrombocytosis Platelets 787 today. Most likely hemoconcentrated on arrival. Concern for myeloproliferative process, paraneoplastic process, or acute phase reactant.  - Pathologist smear review - Trend on CBC   Bipolar  disorder Not on a mood stabilizer at this time, but reports he is well controlled on Sertaline and Buspar. - Continue home sertraline and buspar   Chronic obstructive pulmonary disease No wheeze on exam. - Continue home Anoro ellipta daily and albuterol as needed    FEN/GI: Full liquid diet PPx: heparin  Dispo:Home pending clinical improvement  Subjective:  Patient says his abdominal pain has improved since yesterday. Continues to feel abdominal cramping. Says his colostomy came loose again at around 5 am this morning and he would like help from wound colostomy team to teach him to properly fit his bag to prevent this from happening.   Objective: Temp:  [97.8 F (36.6 C)-98.8 F (37.1 C)] 97.8 F (36.6 C) (04/03 0927) Pulse Rate:  [75-117] 83 (04/03 0927) Resp:  [16-20] 16 (04/03 0500) BP: (136-161)/(84-101) 136/90 (04/03 0927) SpO2:  [95 %-99 %] 95 % (04/03 0927) Weight:  [74.4 kg] 74.4 kg (04/02 1043) Physical Exam: General: In no acute distress Cardiovascular: RRR, cap refill < 2 seconds Respiratory: No increased work of breathing, no wheeze  Abdomen: abdominal wall wound dressed, colostomy bag in place    Laboratory: Most recent CBC Lab Results  Component Value Date   WBC 8.7 10/23/2022   HGB 8.3 (L) 10/23/2022   HCT 25.8 (L) 10/23/2022   MCV 94.9 10/23/2022   PLT 747 (H) 10/23/2022     Lowry Ram, MD 10/23/2022, 9:55 AM  PGY-1, [place] Intern pager: [phone], text pages welcome Secure chat group [place]

## 2022-10-23 NOTE — Progress Notes (Signed)
Assessed bag

## 2022-10-23 NOTE — Consult Note (Signed)
Chief Complaint: Patient was seen in consultation today for  Chief Complaint  Patient presents with   Fall    Referring Physician(s): Alferd Apa, PA-C  Supervising Physician: Corrie Mckusick  Patient Status: [facility] - In-pt  History of Present Illness: Timothy [name] is a 57 y.o. male with a medical history significant for alcohol abuse, bipolar disorder, anxiety/depression, emphysema, HTN, ventral hernia s/p repair (2014) and left colectomy with colostomy for recurrent diverticulitis in 2013. He had his colostomy takedown in January 2014.   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 Surgery has tried to incise and drain the area superficially but haven't been able to get the area to fully resolve. The patient is familiar to IR from prior drain placements/aspirations.   On 10/04/22, he was taken to the OR for exploratory laparotomy, drainage of abdominal wall abscess, removal of mesh, segmental colon resection and lysis of adhesions. On 10/13/22 he developed tachycardia, confusion and abdominal distention. CT showed a defect at the anastomotic site with extensive intraperitoneal air and two large post-op abscesses.   He was taken back to the OR 10/13/22 for ex lap, resection of anastomosis and end colostomy. He had an uneventful post-op recovery and was discharged home 10/19/22.   He presented to the ED 10/22/22 after having a fall at home secondary to dizziness/lightheadedness and vomiting. He was found to have wound dehiscence  CT showed a resolving LLQ fluid collection   CT Abdomen/Pelvis with contrast 10/22/22 Other: Significant decrease in pneumoperitoneum compared to the prior study. Left lower abdominal collection of fluid with [facility] of air measures 8 cm, significantly smaller than on the prior study. IMPRESSION: 1. Left abdominal fluid collection consistent with abscess has diminished in size  compared to the prior study. 2. Mild small bowel dilatation likely representing ileus. 3. Significant decrease in pneumoperitoneum.  Interventional Radiology has been asked to evaluate this patient for an image-guided intra-abdominal fluid collection aspiration with possible drain placement. Imaging reviewed and procedure approved by Dr. Earleen Newport.   Past Medical History:  Diagnosis Date   Acute bronchitis 02/15/2013   Alcohol abuse    Anxiety    Bipolar disorder    Bronchitis    history   Bronchitis    Cellulitis    - left knee-2009   Chronic pain    Complication of anesthesia    Condyloma 02/04/2012   Depression    Diverticulosis    By colonoscopy June 2005   Dyspnea    on exertion at times   Elevated CK 09/2011   Emphysema lung    Emphysema of lung    Family history of anesthesia complication    Mother N/V   GERD (gastroesophageal reflux disease)    Glaucoma syndrome    does not use eye drops, "They burn".   Headache(784.0)    "alot; not daily" (07/23/2012)   Heavy smoker    Hemorrhoids    History of colostomy 03/21/2012   Left colectomy and anastomosis performed by Autumn Messing, MD on 02/18/13 for recurrent diverticulitis Exploratory laparotomy preformed 02/27/12 for anastomotic leak and Hartman pouch/colostomy peformed Colostomy taken down January 2014   History of diverticulitis of colon 10/16/2011   History of dizziness    History of stomach ulcers 2005   HPV (human papilloma virus) infection    Hypertension    started 3 months ago   Hypoglycemia    Incisional hernia  Lower GI bleed    June 2005. Presumably secondary to diverticulosis.   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   Ventral hernia 01/11/2013   Repaired with mesh 05/20/13   Wrist pain 01/26/2019    Past Surgical History:  Procedure Laterality Date   ABDOMINAL EXPLORATION SURGERY  07/23/2012   w/LOA (07/23/2012)   [place] RESECTION  02/27/2012   Procedure: COLON RESECTION;  Surgeon: Merrie Roof, MD;  Location: [place];  Service: General;  Laterality: N/A;   COLON RESECTION N/A 10/04/2022   Procedure: SEGMENTAL COLON RESECTION;  Surgeon: Jovita Kussmaul, MD;  Location: [place];  Service: General;  Laterality: N/A;   COLONOSCOPY  10/13/2022   Procedure: RESECTION OF COLON ANASTOMOSIS AND END COLOSCOPY;  Surgeon: Mickeal Skinner, MD;  Location: [place];  Service: General;;   COLOSTOMY  02/27/2012   Procedure: COLOSTOMY;  Surgeon: Merrie Roof, MD;  Location: [place];  Service: General;  Laterality: N/A;   COLOSTOMY TAKEDOWN  07/23/2012   COLOSTOMY TAKEDOWN  07/23/2012   Procedure: COLOSTOMY TAKEDOWN;  Surgeon: Merrie Roof, MD;  Location: [place];  Service: General;  Laterality: N/A;  Primary Anastomosis   ELBOW FRACTURE SURGERY  ~ 1975   left;  pins inserted    EXCISION OF MESH N/A 10/04/2022   Procedure: REMOVAL OF MESH;  Surgeon: Jovita Kussmaul, MD;  Location: [place];  Service: General;  Laterality: N/A;   INCISION AND DRAINAGE ABSCESS N/A 04/05/2021   Procedure: INCISION AND DRAINAGE ABDOMINAL WALL ABSCESS;  Surgeon: Jovita Kussmaul, MD;  Location: [place];  Service: General;  Laterality: N/A;   INSERTION OF MESH N/A 05/20/2013   Procedure: INSERTION OF MESH;  Surgeon: Merrie Roof, MD;  Location: [place];  Service: General;  Laterality: N/A;   [place] N/A 10/04/2022   Procedure: DRAINAGE ABDOMIAL WALL ABSCESS;  Surgeon: Jovita Kussmaul, MD;  Location[facility];  Service: General;  Laterality: N/A;   LAPAROSCOPIC LEFT COLON RESECTION  02/19/2012   SIGMOID   LAPAROTOMY  02/27/2012   Procedure: EXPLORATORY LAPAROTOMY;  Surgeon: Merrie Roof, MD;  Location: [place];  Service: General;  Laterality: N/A;   LAPAROTOMY  07/23/2012   Procedure: EXPLORATORY LAPAROTOMY;  Surgeon: Merrie Roof, MD;  Location: [place];  Service: General;  Laterality: N/A;   LAPAROTOMY N/A 10/04/2022   Procedure:  EXPLORATORY LAPAROTOMY;  Surgeon: Jovita Kussmaul, MD;  Location: [facility] OR;  Service: General;  Laterality: N/A;   LAPAROTOMY N/A 10/13/2022   Procedure: EXPLORATORY LAPAROTOMY;  Surgeon: Mickeal Skinner, MD;  Location: [place];  Service: General;  Laterality: N/A;   LESION DESTRUCTION  07/23/2012   Procedure: DESTRUCTION LESION ANUS;  Surgeon: Merrie Roof, MD;  Location: ;  Service: General;  Laterality: N/A;  DESTRUCTION ANAL CONDYLOMA   LYSIS OF ADHESION  07/23/2012   Procedure: LYSIS OF ADHESION;  Surgeon: Merrie Roof, MD;  Location: [place];  Service: General;  Laterality: N/A;   LYSIS OF ADHESION N/A 10/04/2022   Procedure: LYSIS OF ADHESION;  Surgeon: Jovita Kussmaul, MD;  Location: [place];  Service: General;  Laterality: N/A;   VENTRAL HERNIA REPAIR  05/20/2013   Dr Marlon Pel HERNIA REPAIR N/A 05/20/2013   Procedure: VENTRAL HERNIA REPAIR ;  Surgeon: Sammuel Hines  Daiva Nakayama, MD;  Location: [place];  Service: General;  Laterality: N/A;   WART FULGURATION  02/19/2012   Procedure: FULGURATION ANAL WART;  Surgeon: Merrie Roof, MD;  Location: [place];  Service: General;  Laterality: N/A;  Destroy  Anal Condyloma    WISDOM TOOTH EXTRACTION  ~ [place] N/A 06/03/2016   Procedure: ABDOMINAL WOUND EXPLORATION AND STITCH REMOVAL;  Surgeon: [name], MD;  Location: [place];  Service: General;  Laterality: N/A;  ABDOMINAL WOUND EXPLORATION AND STITCH REMOVAL    Allergies: Ambien [zolpidem tartrate], Bactroban [mupirocin calcium], Zestril [lisinopril], Darvon [propoxyphene], and Morphine and related  Medications: Prior to Admission medications   Medication Sig Start Date End Date Taking? Authorizing Provider  albuterol (VENTOLIN HFA) 108 (90 Base) MCG/ACT inhaler Inhale 2 puffs into the lungs every 6 (six) hours as needed for wheezing or shortness of breath.   Yes [provider]  Aspirin-Acetaminophen-Caffeine (GOODY HEADACHE PO) Take 1 packet by  mouth 2 (two) times daily as needed (headache, pain).   Yes [provider]  busPIRone (BUSPAR) 15 MG tablet TAKE 1 TABLET BY MOUTH IN THE MORNING. Patient taking differently: Take 15 mg by mouth daily. 09/23/22  Yes Dameron, Luna Fuse, DO  gabapentin (NEURONTIN) 100 MG capsule TAKE 1 CAPSULE BY MOUTH FOUR TIMES DAILY AS NEEDED Patient taking differently: Take 100 mg by mouth 4 (four) times daily as needed (Neuropathic pain). 06/24/22  Yes Gerrit Heck, MD  losartan (COZAAR) 100 MG tablet TAKE 1 TABLET (100 MG TOTAL) BY MOUTH DAILY. 09/23/22  Yes Dameron, Luna Fuse, DO  methocarbamol (ROBAXIN) 500 MG tablet Take 1 tablet (500 mg total) by mouth every 8 (eight) hours as needed for muscle spasms. 10/19/22  Yes Michael Boston, MD  oxyCODONE (OXY IR/ROXICODONE) 5 MG immediate release tablet Take 1 tablet (5 mg total) by mouth every 6 (six) hours as needed for moderate pain. 10/19/22  Yes Michael Boston, MD  potassium chloride (KLOR-CON M) 10 MEQ tablet Take 4 tablets (40 mEq total) by mouth daily for 5 days. 10/19/22 10/24/22 Yes Michael Boston, MD  sertraline (ZOLOFT) 100 MG tablet TAKE 1 TABLET (100 MG TOTAL) BY MOUTH DAILY. 08/29/22  Yes Gerrit Heck, MD  ondansetron (ZOFRAN-ODT) 4 MG disintegrating tablet Take 1 tablet (4 mg total) by mouth every 8 (eight) hours as needed for nausea or vomiting. Patient not taking: Reported on 10/22/2022 06/24/22   Gerrit Heck, MD  pantoprazole (PROTONIX) 40 MG tablet TAKE 1 TABLET BY MOUTH DAILY Patient not taking: Reported on 10/22/2022 09/23/22   Dameron, Luna Fuse, DO  umeclidinium-vilanterol (ANORO ELLIPTA) 62.5-25 MCG/ACT AEPB Inhale 1 puff into the lungs daily. Patient not taking: Reported on 10/01/2022 09/02/22   Gerrit Heck, MD  varenicline (CHANTIX) 0.5 MG tablet Take 1 tablet (0.5 mg total) by mouth 2 (two) times daily with a meal. Once daily for the first week then twice daily Patient not taking: Reported on 08/08/2022 07/01/22   Zenia Resides, MD   lurasidone (LATUDA) 40 MG TABS tablet TAKE 1 TABLET(40 MG) BY MOUTH DAILY WITH BREAKFAST 05/12/19 05/08/20  Nuala Alpha, MD  mometasone-formoterol (DULERA) 100-5 MCG/ACT AERO Inhale 2 puffs into the lungs 2 (two) times daily. 02/22/19 05/19/20  Nuala Alpha, MD     Family History  Problem Relation Age of Onset   Diabetes Mother    Parkinsonism Mother    Depression Mother    Alcohol abuse Father    Hypertension Father  Anxiety disorder Father    Ulcers Father    Colon polyps Father    Breast cancer Maternal Aunt    Diabetes Maternal Grandmother    Stroke Neg Hx    Heart disease Neg Hx    Colon cancer Neg Hx    Stomach cancer Neg Hx     Social History   Socioeconomic History   Marital status: Legally Separated    Spouse name: Not on file   Number of children: 3   Years of education: Not on file   Highest education level: Not on file  Occupational History   Occupation: unemployed  Tobacco Use   Smoking status: Every Day    Packs/day: 2.00    Years: 34.00    Additional pack years: 0.00    Total pack years: 68.00    Types: Cigarettes    Start date: 07/22/1981   Smokeless tobacco: Former    Types: Chew    Quit date: 12/12/2010   Tobacco comments:    Current 2 PPD smoker - Engineer, 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[employer]  Vaping Use   Vaping Use: Never used  Substance and Sexual Activity   Alcohol use: Not Currently    Comment: 6 pack of beer daily   Drug use: Yes    Types: Oxycodone, Marijuana    Comment: Smokes marijuana once a week   Sexual activity: Not on file  Other Topics Concern   Not on file  Social History Narrative   Lives with wife [place], Children Hilery (age 43) Sterling Big (age 68) [name] (age 9).  Unemployed less than high school education.  Updated 10/29/11   Social Determinants of Health   Financial Resource Strain: Medium Risk (07/17/2022)   Overall Financial Resource Strain (CARDIA)    Difficulty of Paying Living Expenses: Somewhat hard  Food Insecurity: No Food  Insecurity (10/22/2022)   Hunger Vital Sign    Worried About Running Out of Food in the Last Year: Never true    Ran Out of Food in the Last Year: Never true  Recent Concern: Food Insecurity - Food Insecurity Present (07/31/2022)   Hunger Vital Sign    Worried About Running Out of Food in the Last Year: Sometimes true    Ran Out of Food in the Last Year: Sometimes true  Transportation Needs: No Transportation Needs (10/22/2022)   PRAPARE - Hydrologist (Medical): No    Lack of Transportation (Non-Medical): No  Physical Activity: Inactive (07/17/2022)   Exercise Vital Sign    Days of Exercise per Week: 0 days    Minutes of Exercise per Session: 0 min  Stress: Stress Concern Present (07/17/2022)   [place]    Feeling of Stress : To some extent  Social Connections: Socially Isolated (07/17/2022)   Social Connection and Isolation Panel [NHANES]    Frequency of Communication with Friends and Family: More than three times a week    Frequency of Social Gatherings with Friends and Family: Three times a week    Attends Religious Services: Never    Active Member of Clubs or Organizations: No    Attends Archivist Meetings: Never    Marital Status: Divorced    Review of Systems: A 12 point ROS discussed and pertinent positives are indicated in the HPI above.  All other systems are negative.  Review of Systems  Constitutional:  Negative for appetite change and fatigue.  Respiratory:  Negative for cough and shortness  of breath.   Cardiovascular:  Negative for chest pain and leg swelling.  Gastrointestinal:        Mild tenderness at RLQ to palpation  Neurological:  Negative for dizziness and headaches.    Vital Signs: BP (!) 136/90 (BP Location: Right Arm)   Pulse 83   Temp 97.8 F (36.6 C) (Oral)   Resp 16   Ht 5\' 9"  (1.753 m)   Wt 164 lb (74.4 kg)   SpO2 95%   BMI 24.22 kg/m    Physical Exam Constitutional:      General: He is not in acute distress.    Appearance: He is not ill-appearing.  Pulmonary:     Effort: Pulmonary effort is normal.  Abdominal:     Palpations: Abdomen is soft.     Comments: Midline abdominal wound covered in gauze. Left sided colostomy. Minimal tenderness to palpation in the RLQ  Skin:    General: Skin is warm and dry.  Neurological:     Mental Status: He is alert and oriented to person, place, and time.     Imaging: CT CHEST WO CONTRAST  Result Date: 10/22/2022 CLINICAL DATA:  Pulmonary nodules. EXAM: CT CHEST WITHOUT CONTRAST TECHNIQUE: Multidetector CT imaging of the chest was performed following the standard protocol without IV contrast. RADIATION DOSE REDUCTION: This exam was performed according to the departmental dose-optimization program which includes automated exposure control, adjustment of the mA and/or kV according to patient size and/or use of iterative reconstruction technique. COMPARISON:  Earlier abdominal CT scan, same date and prior chest CT from 07/11/2022 FINDINGS: Cardiovascular: The heart is normal in size. No pericardial effusion. The aorta is normal in caliber. Stable scattered atherosclerotic calcifications. Stable age advanced three-vessel coronary artery calcifications. Mediastinum/Nodes: Scattered mediastinal and hilar lymph nodes are stable. The esophagus is grossly. Lungs/Pleura: Stable underlying emphysematous changes and pulmonary scarring. No focal infiltrates or pleural effusions. As demonstrated on the earlier abdominal CT scan there are irregular nodules at the left lung base. These were not present on the prior CT scan from 2023 and are most likely inflammatory or infectious. Recommend short-term follow-up noncontrast chest CT in 3 months to reassess. Upper Abdomen: Pneumoperitoneum is seen on earlier abdominal CT scan. Musculoskeletal: No significant bony findings. IMPRESSION: 1. Stable emphysematous  changes and pulmonary scarring. 2. As demonstrated on the earlier abdominal CT scan there are irregular nodules at the left lung base. These were not present on the prior CT scan from 2023 and are most likely inflammatory or infectious. Recommend short-term follow-up noncontrast chest CT in 3 months to reassess. 3. Stable age advanced three-vessel coronary artery calcifications. 4. Pneumoperitoneum is seen on earlier abdominal CT scan. Emphysema (ICD10-J43.9). Electronically Signed   By: Marijo Sanes M.D.   On: 10/22/2022 18:59   CT Abdomen Pelvis W Contrast  Result Date: 10/22/2022 CLINICAL DATA:  Follow up pneumoperitoneum EXAM: CT ABDOMEN AND PELVIS WITH CONTRAST TECHNIQUE: Multidetector CT imaging of the abdomen and pelvis was performed using the standard protocol following bolus administration of intravenous contrast. RADIATION DOSE REDUCTION: This exam was performed according to the departmental dose-optimization program which includes automated exposure control, adjustment of the mA and/or kV according to patient size and/or use of iterative reconstruction technique. CONTRAST:  59mL OMNIPAQUE IOHEXOL 350 MG/ML SOLN COMPARISON:  10/13/2022 FINDINGS: Lower chest: Bibasilar lung nodules largest measuring 1.2 cm on the right. No pericardial or pleural effusion. Hepatobiliary: No focal liver abnormality is seen. No gallstones, gallbladder wall thickening, or biliary dilatation. Pancreas:  Unremarkable. No pancreatic ductal dilatation or surrounding inflammatory changes. Spleen: Normal in size without focal abnormality. Adrenals/Urinary Tract: Adrenal glands are unremarkable. Kidneys are normal, without renal calculi, focal lesion, or hydronephrosis. Bladder is unremarkable. Stomach/Bowel: Mild dilatation of small bowel loops likely an ileus. Left lower quadrant colostomy with evidence of partial left hemicolectomy. Appendix not identified. Vascular/Lymphatic: Aortic atherosclerosis. No enlarged abdominal or  pelvic lymph nodes. Reproductive: Prostate is unremarkable. Other: Significant decrease in pneumoperitoneum compared to the prior study. Left lower abdominal collection of fluid with [facility] of air measures 8 cm, significantly smaller than on the prior study. Musculoskeletal: Bilateral L5 spondylolysis. IMPRESSION: 1. Left abdominal fluid collection consistent with abscess has diminished in size compared to the prior study. 2. Mild small bowel dilatation likely representing ileus. 3. Significant decrease in pneumoperitoneum. Electronically Signed   By: Sammie Bench M.D.   On: 10/22/2022 14:49   CT Head Wo Contrast  Result Date: 10/22/2022 CLINICAL DATA:  Head trauma EXAM: CT HEAD WITHOUT CONTRAST TECHNIQUE: Contiguous axial images were obtained from the base of the skull through the vertex without intravenous contrast. RADIATION DOSE REDUCTION: This exam was performed according to the departmental dose-optimization program which includes automated exposure control, adjustment of the mA and/or kV according to patient size and/or use of iterative reconstruction technique. COMPARISON:  CT Head 08/21/12 FINDINGS: Brain: No evidence of acute infarction, hemorrhage, hydrocephalus, extra-axial collection or mass lesion/mass effect. Vascular: No hyperdense vessel or unexpected calcification. Skull: Normal. Negative for fracture or focal lesion. Sinuses/Orbits: Small left mastoid effusion. No middle ear effusion. Mild mucosal thickening right sphenoid sinus and right maxillary sinus orbits are unremarkable Other: None. IMPRESSION: No CT evidence of intracranial injury. Electronically Signed   By: Marin Roberts M.D.   On: 10/22/2022 14:45   DG Chest 2 View  Result Date: 10/22/2022 CLINICAL DATA:  Possible sepsis, dizziness EXAM: CHEST - 2 VIEW COMPARISON:  10/14/2022 FINDINGS: Cardiac size is within normal limits. There is interval removal of endotracheal tube and NG tube. Lung fields are clear of any infiltrates or  pulmonary edema. There is no pleural effusion or pneumothorax. There is significant interval decrease in amount of pneumoperitoneum under the right hemidiaphragm. IMPRESSION: There are no signs of pulmonary edema or focal pulmonary consolidation. There is significant interval decrease in amount of pneumoperitoneum under the right hemidiaphragm in comparison with the study of 10/14/2022. Electronically Signed   By: Elmer Picker M.D.   On: 10/22/2022 11:32   [place] EKG SITE RITE  Result Date: 10/14/2022 If Site Rite image not attached, placement could not be confirmed due to current cardiac rhythm.  DG Abd Portable 1V  Result Date: 10/14/2022 CLINICAL DATA:  Orogastric tube placement. EXAM: PORTABLE ABDOMEN - 1 VIEW COMPARISON:  Same day. FINDINGS: Distal tip of nasogastric tube is seen in expected position of distal esophagus; it appears to be somewhat retracted compared to prior exam. IMPRESSION: Distal tip of nasogastric tube seen in expected position of distal esophagus and appears to have been somewhat retracted compared to prior exam. Electronically Signed   By: Marijo Conception M.D.   On: 10/14/2022 09:37   DG Abd Portable 1V  Result Date: 10/14/2022 CLINICAL DATA:  Evaluate NG tube placement. EXAM: PORTABLE ABDOMEN - 1 VIEW COMPARISON:  10/14/2022 FINDINGS: Enteric tube is tip and side port are below the GE junction within the gastric fundus. Pneumoperitoneum is again noted along the undersurface of the right hemidiaphragm unchanged gaseous distension of the transverse colon. IMPRESSION: 1. Satisfactory  position of enteric tube. 2. Unchanged gaseous distension of the transverse colon. 3. Persistent pneumoperitoneum which most likely reflects changes from recent laparotomy. Electronically Signed   By: Kerby Moors M.D.   On: 10/14/2022 08:08   DG Abd Portable 1V  Result Date: 10/14/2022 CLINICAL DATA:  OG tube placement. EXAM: PORTABLE ABDOMEN - 1 VIEW COMPARISON:  Earlier same day FINDINGS:  OG tube tip is in the distal esophagus. Tube could be advanced 16-20 cm to place the proximal side port below the GE junction, as clinically warranted. Gaseous distention of transverse colon noted upper abdomen. IMPRESSION: OG tube tip is in the distal esophagus.  See above. Electronically Signed   By: Misty Stanley M.D.   On: 10/14/2022 08:02   CT ABDOMEN PELVIS W CONTRAST  Result Date: 10/14/2022 CLINICAL DATA:  Vomiting EXAM: CT ABDOMEN AND PELVIS WITH CONTRAST TECHNIQUE: Multidetector CT imaging of the abdomen and pelvis was performed using the standard protocol following bolus administration of intravenous contrast. RADIATION DOSE REDUCTION: This exam was performed according to the departmental dose-optimization program which includes automated exposure control, adjustment of the mA and/or kV according to patient size and/or use of iterative reconstruction technique. CONTRAST:  52mL ISOVUE-370 IOPAMIDOL (ISOVUE-370) INJECTION 76% COMPARISON:  08/14/2022 FINDINGS: Lower chest: Patchy atelectatic changes are noted in the bases bilaterally. Hepatobiliary: No focal liver abnormality is seen. No gallstones, gallbladder wall thickening, or biliary dilatation. Pancreas: Unremarkable. No pancreatic ductal dilatation or surrounding inflammatory changes. Spleen: Normal in size without focal abnormality. Adrenals/Urinary Tract: Adrenal glands are within normal limits. Kidneys demonstrate a normal enhancement pattern bilaterally. No renal calculi or obstructive changes are seen. The bladder is decompressed. Stomach/Bowel: Postsurgical changes are noted is at the junction of the descending and sigmoid colons with a defect at the anastomotic site and adjacent free air. There is extensive intraperitoneal air identified consistent with this disruption. The more proximal colon appears within normal limits. The appendix is not visualized consistent with a prior surgical history. Stomach is well distended with fluid. Some  mildly dilated loops of small bowel are not identified likely reactive to a large intra-abdominal air-fluid collection in the left mid abdomen. This measures approximately 12.6 x 8.5 cm consistent with postoperative abscess. This is best seen on image number 61 of series 3. Adjacent to this is a smaller air-fluid collection measuring 7.4 x 3.6 cm. These 2 air-fluid collections may inter connect although it is difficult to demonstrate on this exam. Vascular/Lymphatic: Aortic atherosclerosis. No enlarged abdominal or pelvic lymph nodes. Reproductive: Prostate is unremarkable. Other: No focal hernia is noted. Previously seen subcutaneous abscess has resolved following prior surgery. Open anterior wound is noted with packing material within. Musculoskeletal: No acute or significant osseous findings. IMPRESSION: Considerable amount of free intraperitoneal air related to breakdown of colonic anastomosis in the left lower quadrant best seen on image number 69 of series 3. Additionally there are 2 large air-fluid collections consistent with postoperative abscess. Associated small bowel dilatation is noted likely of a reactive nature. These findings were not communicated to the referring clinician at this time as the patient has already return to the operating room prior to the interpretation of this exam. Electronically Signed   By: Inez Catalina M.D.   On: 10/14/2022 02:23   DG Abd Portable 1V  Result Date: 10/14/2022 CLINICAL DATA:  Check gastric catheter placement EXAM: PORTABLE ABDOMEN - 1 VIEW COMPARISON:  None Available. FINDINGS: Gastric catheter is noted in the distal esophagus. This should be advanced several  cm deeper into the stomach. IMPRESSION: Gastric catheter in the distal esophagus. This should be advanced deeper into the stomach. Electronically Signed   By: Inez Catalina M.D.   On: 10/14/2022 02:06   DG CHEST PORT 1 VIEW  Result Date: 10/14/2022 CLINICAL DATA:  Check endotracheal tube and gastric  catheter placement, initial encounter EXAM: PORTABLE CHEST 1 VIEW COMPARISON:  10/11/2022 FINDINGS: Cardiac shadow is stable. Endotracheal tube is noted 5 cm above the carina. Gastric catheter is seen in the distal esophagus well short of the stomach. This should be advanced several cm. The lungs are clear. Free air is noted beneath the right hemidiaphragm stable from the prior exam. IMPRESSION: Tubes and lines as described. Gastric catheter should be advanced deeper into the stomach. Free intraperitoneal air beneath the right hemidiaphragm similar to that seen on the prior exam. Electronically Signed   By: Inez Catalina M.D.   On: 10/14/2022 02:06   DG Chest Port 1 View  Result Date: 10/11/2022 CLINICAL DATA:  Productive cough EXAM: PORTABLE CHEST 1 VIEW COMPARISON:  Previous chest radiographs done on 07/24/2021, CT chest done on 07/11/2022 FINDINGS: Cardiac size is within normal limits. There are no signs of pulmonary edema. Linear densities are seen in medial left lower lung field. There is no pleural effusion or pneumothorax. There is a density under the right hemidiaphragm. This finding was not evident in the previous chest radiographs. IMPRESSION: Linear densities in medial left lower lung fields suggest atelectasis/pneumonia. Right hemidiaphragm is elevated along with air attenuation under the right hemidiaphragm, most likely due to interposition of gas-filled bowel loops between liver and right hemidiaphragm. If there is clinical suspicion for abdominal pathology such as pneumoperitoneum, follow-up supine and upright abdominal radiographs along with CT if warranted may be considered. Electronically Signed   By: Elmer Picker M.D.   On: 10/11/2022 12:13   DG Abd 1 View  Addendum Date: 10/07/2022   ADDENDUM REPORT: 10/07/2022 00:53 ADDENDUM: Large quantity of free air in the right upper quadrant, markedly increased from the prior exam, surgical consultation is recommended. Critical Value/emergent  results were called by telephone at the time of interpretation on 10/07/2022 at 12:49 a.m. to provider Erling Conte RN, who verbally acknowledged these results. Electronically Signed   By: Brett Fairy M.D.   On: 10/07/2022 00:53   Result Date: 10/07/2022 CLINICAL DATA:  NGT placement. EXAM: ABDOMEN - 1 VIEW COMPARISON:  10/05/2022, 10/04/2022. FINDINGS: Multiple loops of dilated small bowel are noted in the upper abdomen measuring up to 4.2 cm. There is free air underneath the right diaphragm. An enteric tube terminates in the stomach. IMPRESSION: 1. Increased pneumoperitoneum under the right diaphragm as compared with previous exams. Clinical correlation is recommended to exclude perforated viscus. 2. Multiple distended loops of small bowel in the abdomen measuring up to 4.2 cm, possible ileus versus obstruction. 3. Enteric tube terminates in the stomach. Electronically Signed: By: Brett Fairy M.D. On: 10/07/2022 00:45   DG Abd 1 View  Result Date: 10/05/2022 CLINICAL DATA:  Encounter for nasogastric tube placement. EXAM: ABDOMEN - 1 VIEW COMPARISON:  Radiograph yesterday FINDINGS: The tip of the enteric tube is below the diaphragm in the stomach, the side port remains in the region of the distal esophagus. Advancement of at least 5 cm is recommended for optimal placement. Decreased gaseous gastric distension. Small volume of free air in the abdomen, previously reported as postsurgical. IMPRESSION: The tip of the enteric tube is below the diaphragm in the stomach,  the side port remains in the region of the distal esophagus. Advancement of at least 5 cm is recommended for optimal placement. Electronically Signed   By: Keith Rake M.D.   On: 10/05/2022 16:41   DG Abd 1 View  Result Date: 10/04/2022 CLINICAL DATA:  Enteric tube placement. Status post exploratory laparotomy. EXAM: ABDOMEN - 1 VIEW COMPARISON:  CT abdomen pelvis dated August 14, 2022. FINDINGS: Enteric tube tip in the distal esophagus.  Distended stomach. Small volume pneumoperitoneum. Clear lung bases. IMPRESSION: 1. Enteric tube tip in the distal esophagus. Recommend advancement. 2. Postsurgical small volume pneumoperitoneum. Electronically Signed   By: Titus Dubin M.D.   On: 10/04/2022 12:09    Labs:  CBC: Recent Labs    10/17/22 1348 10/19/22 0326 10/22/22 1048 10/22/22 1055 10/23/22 0826 10/23/22 1352  WBC 13.4* 10.1 20.1*  --  8.7  --   HGB 9.2* 8.3* 11.3* 12.6* 8.3* 8.9*  HCT 28.4* 26.0* 36.3* 37.0* 25.8* 28.7*  PLT 888* 847* 1,055*  --  747*  --     COAGS: Recent Labs    12/31/21 0823 10/14/22 0752 10/22/22 1048 10/22/22 1059  INR 0.9 1.3* 1.0  --   APTT  --   --   --  29    BMP: Recent Labs    10/14/22 0752 10/15/22 0357 10/16/22 0533 10/19/22 0326 10/22/22 1048 10/22/22 1055  NA 134* 136 139  --  138 140  K 5.0 4.1 3.7 2.5* 3.7 3.7  CL 101 101 97*  --  103 104  CO2 25 26 28   --  22  --   GLUCOSE 137* 94 87  --  120* 123*  BUN 9 11 6   --  10 12  CALCIUM 8.0* 7.7* 8.3*  --  8.1*  --   CREATININE 1.42* 1.26* 0.85 0.86 0.90 0.90  GFRNONAA 59* >60 >60 >60 >60  --     LIVER FUNCTION TESTS: Recent Labs    10/01/22 0923 10/14/22 0752 10/15/22 0357 10/22/22 1048  BILITOT 0.6 1.0 0.7 0.5  AST 28 15 18 26   ALT 17 14 13 23   ALKPHOS 85 65 67 134*  PROT 7.7 4.5* 4.5* 7.1  ALBUMIN 4.1 1.6* 1.5* 2.3*    TUMOR MARKERS: No results for input(s): "AFPTM", "CEA", "CA199", "CHROMGRNA" in the last 8760 hours.  Assessment and Plan:  Colon resection with anastomosis complicated by anastomotic leak and intra-abdominal abscess collections: I met with the patient this afternoon to discuss drain placement. The patient stated he was told the fluid collection was smaller and that he would be treated with antibiotics/conservative measures.   I confirmed with the patient that the fluid collection was smaller but that our team had been requested to aspirate/place drain. We discussed both options  (drain versus conservative management) and the patient stated he was feeling better, had minimal pain, was eating very well and would rather not have a drain.   Patient aware he may still need a drain at a later point if conservative efforts fail.   No IR procedure planned. Ordering provider/primary team  made aware. The drain order will be deleted.   Thank you for this interesting consult.  I greatly enjoyed meeting Draco Donate and look forward to participating in their care.  A copy of this report was sent to the requesting provider on this date.  Electronically Signed: Soyla Dryer, AGACNP-BC [phone] 10/23/2022, 3:24 PM   I spent a total of 20 Minutes    in  face to face in clinical consultation, greater than 50% of which was counseling/coordinating care for intra-abdominal abscess drain.

## 2022-10-23 NOTE — Consult Note (Signed)
[place] Nurse ostomy follow up Stoma type/location: LLQ colostomy with mucocutaneous separation.  4" pouch has leaked and difficult to manage with midline wound.  Will downsize to 2 3/4" pouch with barrier ring Stomal assessment/size: 2" os points towards 9 o'clock where separation and nonhealing wound is.  Fill defect with aquacel and barrier ring.  2 piece pouch applied. Patient agrees to come into ostomy clinic for ongoing assistance.  Has not been set up with a supply company yet.  Will work on this.   Peristomal assessment: MC separation.  MIdline wound. No redness to peristomal skin noted today.   Treatment options for stomal/peristomal skin: aquacel to open wound, cover with barrier ring, remainder of barrier ring to peristomal skin.  2 piece 2 3/4" pouch Output soft brown stool Ostomy pouching: 2pc. 2 3/4" pouch with barrier ring.  Peristomal wound care is aquacel and barrier ring  Education provided: pouch change provided.  Barrier cut to fit and pouch applied. Demonstrated wound care   Change twice weekly when leaking  Enrolled patient in [facility] Discharge program: Yes previously Will order 5 pouch sets for home Will not follow at this time.  Please re-consult if needed.  Estrellita Ludwig MSN, RN, FNP-BC CWON Wound, Ostomy, Continence Nurse [facility] [phone] Pager [phone]

## 2022-10-23 NOTE — Evaluation (Signed)
Physical Therapy Evaluation Patient Details Name: Jonothon Brunelle MRN: [id number] DOB: 05-Dec-1965 Today's Date: 10/23/2022  History of Present Illness  57 y.o. male presents to [facility] hospital on 10/22/2022 after falling at home, preceded by dizziness and lightheadedness. Pt also with recent nausea and vomiting. Pt recently discharged 10/19/2022 after colon resection and colostomy. Pt found to have lung nodule on CT chest. PMH includes HTN, abdominal wall abscess, OA, emphysema.  Clinical Impression  Pt presents to PT with mild deficits in weakness compared to baseline. Pt is able to mobilize at a modI level and ambulate for limited community distances without assistance. Pt appears motivated to continue mobilizing during admission and at the time of discharge. Pt does not demonstrate the need for continued acute PT services at this time. PT anticipates the pt will progress well at the time of discharge and does not recommend immediate post-acute follow-up.       Recommendations for follow up therapy are one component of a multi-disciplinary discharge planning process, led by the attending physician.  Recommendations may be updated based on patient status, additional functional criteria and insurance authorization.  Follow Up Recommendations       Assistance Recommended at Discharge PRN  Patient can return home with the following  Assistance with cooking/housework;Assist for transportation;Help with stairs or ramp for entrance    Equipment Recommendations None recommended by PT (has necessary DME)  Recommendations for Other Services       Functional Status Assessment Patient has had a recent decline in their functional status and demonstrates the ability to make significant improvements in function in a reasonable and predictable amount of time.     Precautions / Restrictions Precautions Precautions: Fall Precaution Comments: colostomy, Required Braces or Orthoses: Other Brace Other Brace:  abdominal binder Restrictions Weight Bearing Restrictions: No      Mobility  Bed Mobility Overal bed mobility: Modified Independent Bed Mobility: Supine to Sit, Sit to Supine     Supine to sit: Modified independent (Device/Increase time) Sit to supine: Modified independent (Device/Increase time)        Transfers Overall transfer level: Independent Equipment used: None Transfers: Sit to/from Stand Sit to Stand: Independent                Ambulation/Gait Ambulation/Gait assistance: Modified independent (Device/Increase time) Gait Distance (Feet): 400 Feet Assistive device: None Gait Pattern/deviations: Step-through pattern Gait velocity: functional Gait velocity interpretation: >2.62 ft/sec, indicative of community ambulatory   General Gait Details: steady step-through gait  Stairs            Wheelchair Mobility    Modified Rankin (Stroke Patients Only)       Balance Overall balance assessment: Independent Sitting-balance support: No upper extremity supported, Feet supported Sitting balance-Leahy Scale: Good     Standing balance support: No upper extremity supported, During functional activity Standing balance-Leahy Scale: Good                               Pertinent Vitals/Pain Pain Assessment Pain Assessment: Faces Faces Pain Scale: Hurts little more Pain Location: abdomen Pain Descriptors / Indicators: Grimacing Pain Intervention(s): Monitored during session    Home Living Family/patient expects to be discharged to:: Private residence Living Arrangements: Children Available Help at Discharge: Family;Available PRN/intermittently Type of Home: House Home Access: Stairs to enter Entrance Stairs-Rails: Right Entrance Stairs-Number of Steps: 3   Home Layout: One level Home Equipment: Conservation officer, nature (2 wheels) Additional Comments:  [place] acres. Has multiple dogs. One had a recent litter of pups.    Prior Function Prior  Level of Function : Independent/Modified Independent             Mobility Comments: on disability       Hand Dominance   Dominant Hand: Right    Extremity/Trunk Assessment   Upper Extremity Assessment Upper Extremity Assessment: Overall WFL for tasks assessed    Lower Extremity Assessment Lower Extremity Assessment: Overall WFL for tasks assessed    Cervical / Trunk Assessment Cervical / Trunk Assessment: Other exceptions Cervical / Trunk Exceptions: colostomy, abdominal wound  Communication   Communication: No difficulties  Cognition Arousal/Alertness: Awake/alert Behavior During Therapy: WFL for tasks assessed/performed Overall Cognitive Status: Within Functional Limits for tasks assessed                                          General Comments General comments (skin integrity, edema, etc.): VSS on RA    Exercises     Assessment/Plan    PT Assessment Patient does not need any further PT services  PT Problem List         PT Treatment Interventions      PT Goals (Current goals can be found in the Care Plan section)       Frequency       Co-evaluation               AM-PAC PT "6 Clicks" Mobility  Outcome Measure Help needed turning from your back to your side while in a flat bed without using bedrails?: None Help needed moving from lying on your back to sitting on the side of a flat bed without using bedrails?: None Help needed moving to and from a bed to a chair (including a wheelchair)?: None Help needed standing up from a chair using your arms (e.g., wheelchair or bedside chair)?: None Help needed to walk in hospital room?: None Help needed climbing 3-5 steps with a railing? : None 6 Click Score: 24    End of Session   Activity Tolerance: Patient tolerated treatment well Patient left: in bed;with call bell/phone within reach Nurse Communication: Mobility status PT Visit Diagnosis: Muscle weakness (generalized)  (M62.81)    Time: [id number] PT Time Calculation (min) (ACUTE ONLY): 16 min   Charges:   PT Evaluation $PT Eval Low Complexity: [place], PT, DPT Acute Rehabilitation Office [phone]   Zenaida Niece 10/23/2022, 5:06 PM

## 2022-10-23 NOTE — Care Management Obs Status (Signed)
[place] NOTIFICATION   Patient Details  Name: Timothy [name] MRN: [id number] Date of Birth: January 31, 1966   Medicare Observation Status Notification Given:  Yes    Cyndi Bender, RN 10/23/2022, 12:36 PM

## 2022-10-23 NOTE — TOC Initial Note (Signed)
Transition of Care [facility]) - Initial/Assessment Note    Patient Details  Name: Timothy [name] MRN: [id number] Date of Birth: Jun 24, 1966  [facility] CM/SW Contact:    Cyndi Bender, RN Phone Number: 10/23/2022, 12:59 PM  Clinical Narrative:                  Spoke to patient regarding transition needs.  Patient states he has home health with [facility]. Patient has transportation to apts and at discharge.  Patient declines the need for SA resources.  Address, Phone number and PCP verified. TOC will continue to follow for needs.   Will need home health resumption orders for nursing. Expected Discharge Plan: [place] Barriers to Discharge: Continued Medical Work up   Patient Goals and CMS Choice Patient states their goals for this hospitalization and ongoing recovery are:: return home CMS Medicare.gov Compare Post Acute Care list provided to:: Patient Choice offered to / list presented to : Patient      Expected Discharge Plan and Services   Discharge Planning Services: CM Consult Post Acute Care Choice: [place] arrangements for the past 2 months: [place]: RN [place] Agency: [place] ([place]) Date [place]: 10/23/22 Time [place]: 25 Representative spoke with at [place]: [place] Arrangements/Services Living arrangements for the past 2 months: [place] Lives with:: Adult Children Patient language and need for interpreter reviewed:: Yes        Need for Family Participation in Patient Care: Yes (Comment) Care giver support system in place?: Yes (comment) Current home services: DME (walker) Criminal Activity/Legal Involvement Pertinent to Current Situation/Hospitalization: No - Comment as needed  Activities of Daily Living Home Assistive Devices/Equipment: Ostomy supplies ADL Screening (condition at time of  admission) Patient's cognitive ability adequate to safely complete daily activities?: Yes Is the patient deaf or have difficulty hearing?: No Does the patient have difficulty seeing, even when wearing glasses/contacts?: No Does the patient have difficulty concentrating, remembering, or making decisions?: No Patient able to express need for assistance with ADLs?: No Does the patient have difficulty dressing or bathing?: No Independently performs ADLs?: Yes (appropriate for developmental age) Communication: Independent Does the patient have difficulty walking or climbing stairs?: No Weakness of Legs: None Weakness of Arms/Hands: None  Permission Sought/Granted                  Emotional Assessment Appearance:: Appears stated age Attitude/Demeanor/Rapport: Gracious Affect (typically observed): Accepting Orientation: : Oriented to Self, Oriented to Place, Oriented to Situation, Oriented to  Time Alcohol / Substance Use: Alcohol Use Psych Involvement: No (comment)  Admission diagnosis:  Abdominal wall abscess [L02.211] Intra-abdominal abscess [K65.1] Sepsis, due to unspecified organism, unspecified whether acute organ dysfunction present [A41.9] Patient Active Problem List   Diagnosis Date Noted   Thrombocytosis 10/22/2022   Hypokalemia 10/19/2022   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   Status post surgery 10/13/2022   Malnutrition of moderate degree 10/09/2022   Intra-abdominal abscess 10/04/2022   Suspected sleep apnea 06/24/2022   Hematemesis with nausea 07/25/2021   Depression, recurrent 05/08/2021   Lung nodule seen on imaging study 05/19/2020   Chronic obstructive pulmonary disease 07/07/2017   Mood disorder 04/13/2017   Alcohol use with alcohol-induced mood disorder 03/31/2017   Cocaine  use with cocaine-induced mood disorder 03/31/2017   Chronic pain syndrome 02/14/2017   PUD (peptic ulcer disease) 01/16/2017   Sepsis 08/16/2016   Skin ulcer of abdominal wall 12/27/2015    COPD exacerbation 07/12/2015   Akathisia 03/02/2013   Essential hypertension, benign 06/28/2012   Bipolar disorder 06/02/2012   Insomnia 04/27/2012   Panic anxiety syndrome 03/21/2012   Alcohol abuse 10/29/2011   Tobacco use 10/16/2011   PCP:  Gerrit Heck, MD Pharmacy:   [facility] [place] [address] Phone: [phone] Fax: [phone]  [name] [address] [place] [address] Phone: [phone] Fax: [phone]     Social Determinants of Health (SDOH) Social History: SDOH Screenings   Food Insecurity: No Food Insecurity (10/22/2022)  Recent Concern: Food Insecurity - Food Insecurity Present (07/31/2022)  Housing: Low Risk  (10/22/2022)  Transportation Needs: No Transportation Needs (10/22/2022)  Utilities: Not At Risk (10/22/2022)  Recent Concern: Utilities - At Risk (07/31/2022)  Alcohol Screen: Medium Risk (07/17/2022)  Depression (PHQ2-9): High Risk (09/02/2022)  Financial Resource Strain: Medium Risk (07/17/2022)  Physical Activity: Inactive (07/17/2022)  Social Connections: Socially Isolated (07/17/2022)  Stress: Stress Concern Present (07/17/2022)  Tobacco Use: High Risk (10/22/2022)   SDOH Interventions:     Readmission Risk Interventions    10/19/2022    2:41 PM 10/17/2022    2:52 PM  Readmission Risk Prevention Plan  Transportation Screening Complete Complete  PCP or Specialist Appt within 5-7 Days  Complete  PCP or Specialist Appt within 3-5 Days Complete   Home Care Screening  Complete  Medication Review (RN CM)  Referral to Pharmacy  HRI or Home Care Consult Complete   Social Work Consult for [place] Planning/Counseling Complete   Palliative Care Screening Not Applicable   Medication Review Press photographer) Referral to Pharmacy

## 2022-10-23 NOTE — Progress Notes (Signed)
Progress Note     Subjective: Feeling better. Some crampy pain over the left side of his abdomen. Some pain near his incision as well. Tolerating fld without n/v or worsening pain. Having ostomy output.   RN in room. Reports they are working on getting mepitel and ostomy nurse to change is ostomy bag this am.   He was staying with his son who was helping with dressing changes at home.   Afebrile. Tachycardia resolved. No hypotension. WBC normalized.   Objective: Vital signs in last 24 hours: Temp:  [97.9 F (36.6 C)-98.8 F (37.1 C)] 97.9 F (36.6 C) (04/03 0500) Pulse Rate:  [75-117] 98 (04/03 0500) Resp:  [16-20] 16 (04/03 0500) BP: (139-161)/(84-101) 161/95 (04/03 0500) SpO2:  [96 %-99 %] 99 % (04/03 0500) Weight:  [74.4 kg] 74.4 kg (04/02 1043) Last BM Date : 10/23/22  Intake/Output from previous day: 04/02 0701 - 04/03 0700 In: 2550 [P.O.:300; IV Piggyback:2250] Out: 1300 [Urine:1300] Intake/Output this shift: No intake/output data recorded.  PE: General: pleasant, WD, WN male who is laying in bed in NAD Heart: regular, rate, and rhythm.  Lungs: Respiratory effort nonlabored Abd: Soft, appropriately ttp around his incision with some left sided ttp as well. No rigidity or guarding. +BS. Stoma viable. Ostomy bag with some liquid stool in bag. Midline wound with fascial separation with fibrinous tissue in wound base - no visible bowel. See picture below.    Lab Results:  Recent Labs    10/22/22 1048 10/22/22 1055  WBC 20.1*  --   HGB 11.3* 12.6*  HCT 36.3* 37.0*  PLT 1,055*  --     BMET Recent Labs    10/22/22 1048 10/22/22 1055  NA 138 140  K 3.7 3.7  CL 103 104  CO2 22  --   GLUCOSE 120* 123*  BUN 10 12  CREATININE 0.90 0.90  CALCIUM 8.1*  --     PT/INR Recent Labs    10/22/22 1048  LABPROT 13.2  INR 1.0    CMP     Component Value Date/Time   NA 140 10/22/2022 1055   NA 137 07/25/2021 1253   K 3.7 10/22/2022 1055   CL 104  10/22/2022 1055   CO2 22 10/22/2022 1048   GLUCOSE 123 (H) 10/22/2022 1055   BUN 12 10/22/2022 1055   BUN 25 (H) 07/25/2021 1253   CREATININE 0.90 10/22/2022 1055   CREATININE 0.98 05/16/2016 1129   CALCIUM 8.1 (L) 10/22/2022 1048   PROT 7.1 10/22/2022 1048   PROT 6.8 11/11/2016 1135   ALBUMIN 2.3 (L) 10/22/2022 1048   ALBUMIN 4.2 11/11/2016 1135   AST 26 10/22/2022 1048   ALT 23 10/22/2022 1048   ALKPHOS 134 (H) 10/22/2022 1048   BILITOT 0.5 10/22/2022 1048   BILITOT <0.2 11/11/2016 1135   GFRNONAA >60 10/22/2022 1048   GFRNONAA >89 05/16/2016 1129   GFRAA 66 03/24/2020 1609   GFRAA >89 05/16/2016 1129   Lipase     Component Value Date/Time   LIPASE 33 02/02/2018 1654       Studies/Results: CT CHEST WO CONTRAST  Result Date: 10/22/2022 CLINICAL DATA:  Pulmonary nodules. EXAM: CT CHEST WITHOUT CONTRAST TECHNIQUE: Multidetector CT imaging of the chest was performed following the standard protocol without IV contrast. RADIATION DOSE REDUCTION: This exam was performed according to the departmental dose-optimization program which includes automated exposure control, adjustment of the mA and/or kV according to patient size and/or use of iterative reconstruction technique. COMPARISON:  Earlier  abdominal CT scan, same date and prior chest CT from 07/11/2022 FINDINGS: Cardiovascular: The heart is normal in size. No pericardial effusion. The aorta is normal in caliber. Stable scattered atherosclerotic calcifications. Stable age advanced three-vessel coronary artery calcifications. Mediastinum/Nodes: Scattered mediastinal and hilar lymph nodes are stable. The esophagus is grossly. Lungs/Pleura: Stable underlying emphysematous changes and pulmonary scarring. No focal infiltrates or pleural effusions. As demonstrated on the earlier abdominal CT scan there are irregular nodules at the left lung base. These were not present on the prior CT scan from 2023 and are most likely inflammatory or  infectious. Recommend short-term follow-up noncontrast chest CT in 3 months to reassess. Upper Abdomen: Pneumoperitoneum is seen on earlier abdominal CT scan. Musculoskeletal: No significant bony findings. IMPRESSION: 1. Stable emphysematous changes and pulmonary scarring. 2. As demonstrated on the earlier abdominal CT scan there are irregular nodules at the left lung base. These were not present on the prior CT scan from 2023 and are most likely inflammatory or infectious. Recommend short-term follow-up noncontrast chest CT in 3 months to reassess. 3. Stable age advanced three-vessel coronary artery calcifications. 4. Pneumoperitoneum is seen on earlier abdominal CT scan. Emphysema (ICD10-J43.9). Electronically Signed   By: Marijo Sanes M.D.   On: 10/22/2022 18:59   CT Abdomen Pelvis W Contrast  Result Date: 10/22/2022 CLINICAL DATA:  Follow up pneumoperitoneum EXAM: CT ABDOMEN AND PELVIS WITH CONTRAST TECHNIQUE: Multidetector CT imaging of the abdomen and pelvis was performed using the standard protocol following bolus administration of intravenous contrast. RADIATION DOSE REDUCTION: This exam was performed according to the departmental dose-optimization program which includes automated exposure control, adjustment of the mA and/or kV according to patient size and/or use of iterative reconstruction technique. CONTRAST:  12mL OMNIPAQUE IOHEXOL 350 MG/ML SOLN COMPARISON:  10/13/2022 FINDINGS: Lower chest: Bibasilar lung nodules largest measuring 1.2 cm on the right. No pericardial or pleural effusion. Hepatobiliary: No focal liver abnormality is seen. No gallstones, gallbladder wall thickening, or biliary dilatation. Pancreas: Unremarkable. No pancreatic ductal dilatation or surrounding inflammatory changes. Spleen: Normal in size without focal abnormality. Adrenals/Urinary Tract: Adrenal glands are unremarkable. Kidneys are normal, without renal calculi, focal lesion, or hydronephrosis. Bladder is unremarkable.  Stomach/Bowel: Mild dilatation of small bowel loops likely an ileus. Left lower quadrant colostomy with evidence of partial left hemicolectomy. Appendix not identified. Vascular/Lymphatic: Aortic atherosclerosis. No enlarged abdominal or pelvic lymph nodes. Reproductive: Prostate is unremarkable. Other: Significant decrease in pneumoperitoneum compared to the prior study. Left lower abdominal collection of fluid with [facility] of air measures 8 cm, significantly smaller than on the prior study. Musculoskeletal: Bilateral L5 spondylolysis. IMPRESSION: 1. Left abdominal fluid collection consistent with abscess has diminished in size compared to the prior study. 2. Mild small bowel dilatation likely representing ileus. 3. Significant decrease in pneumoperitoneum. Electronically Signed   By: Sammie Bench M.D.   On: 10/22/2022 14:49   CT Head Wo Contrast  Result Date: 10/22/2022 CLINICAL DATA:  Head trauma EXAM: CT HEAD WITHOUT CONTRAST TECHNIQUE: Contiguous axial images were obtained from the base of the skull through the vertex without intravenous contrast. RADIATION DOSE REDUCTION: This exam was performed according to the departmental dose-optimization program which includes automated exposure control, adjustment of the mA and/or kV according to patient size and/or use of iterative reconstruction technique. COMPARISON:  CT Head 08/21/12 FINDINGS: Brain: No evidence of acute infarction, hemorrhage, hydrocephalus, extra-axial collection or mass lesion/mass effect. Vascular: No hyperdense vessel or unexpected calcification. Skull: Normal. Negative for fracture or focal lesion. Sinuses/Orbits:  Small left mastoid effusion. No middle ear effusion. Mild mucosal thickening right sphenoid sinus and right maxillary sinus orbits are unremarkable Other: None. IMPRESSION: No CT evidence of intracranial injury. Electronically Signed   By: Marin Roberts M.D.   On: 10/22/2022 14:45   DG Chest 2 View  Result Date:  10/22/2022 CLINICAL DATA:  Possible sepsis, dizziness EXAM: CHEST - 2 VIEW COMPARISON:  10/14/2022 FINDINGS: Cardiac size is within normal limits. There is interval removal of endotracheal tube and NG tube. Lung fields are clear of any infiltrates or pulmonary edema. There is no pleural effusion or pneumothorax. There is significant interval decrease in amount of pneumoperitoneum under the right hemidiaphragm. IMPRESSION: There are no signs of pulmonary edema or focal pulmonary consolidation. There is significant interval decrease in amount of pneumoperitoneum under the right hemidiaphragm in comparison with the study of 10/14/2022. Electronically Signed   By: Elmer Picker M.D.   On: 10/22/2022 11:32    Anti-infectives: Anti-infectives (From admission, onward)    Start     Dose/Rate Route Frequency Ordered Stop   10/23/22 0746  metroNIDAZOLE (FLAGYL) IVPB 500 mg        500 mg 100 mL/hr over 60 Minutes Intravenous Every 12 hours 10/23/22 0747     10/22/22 2000  ceFEPIme (MAXIPIME) 2 g in sodium chloride 0.9 % 100 mL IVPB        2 g 200 mL/hr over 30 Minutes Intravenous Every 8 hours 10/22/22 1142     10/22/22 1700  metroNIDAZOLE (FLAGYL) IVPB 500 mg        500 mg 100 mL/hr over 60 Minutes Intravenous  Once 10/22/22 1647 10/22/22 1954   10/22/22 1145  ceFEPIme (MAXIPIME) 2 g in sodium chloride 0.9 % 100 mL IVPB        2 g 200 mL/hr over 30 Minutes Intravenous  Once 10/22/22 1140 10/22/22 1349   10/22/22 1145  metroNIDAZOLE (FLAGYL) IVPB 500 mg        500 mg 100 mL/hr over 60 Minutes Intravenous  Once 10/22/22 1140 10/22/22 1349       Patient is a 57 year old male with Hx of prior sigmoid colectomy for diverticulitis, anastomotic leak followed by Jeanette Caprice resection in 2013 and colostomy takedown with hernia repair in 2014. 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 Also had colon resection with anastomosis.  Initially did well but then developed anastomotic leak and had to be taken back to the OR 10/13/22 for colon resection and colostomy. Mild post-operative ileus which resolved and patient completed a course of antibiotics peri-operatively. Patient was discharged 10/19/22.   Assessment/Plan S/P ex-lap, drainage of abdominal wall abscess, removal of mesh, segmental colon resection and LOA 10/04/22 Dr. Marlou Starks Anastomotic leak s/p ex-lap, resection of colon anastomosis and end colostomy 10/13/22 Dr. Ninfa Linden - Recently discharged 3/30, returned to ED today after fall at home.  - CT AP shows decreased LLQ collection. Reviewed with MD. Will have IR review for possible perc drainage. Cont abx.  - Recommend mepitel in wound base and BID wet to dry dressing changes. Will need teaching with patient and son before d/c. Recommend abdominal binder to try to prevent further dehiscence  - WOC following for stomal care - Mobilize, pulm toilet   FEN: Adv to soft diet, IVF per primary VTE: SCDs, Subq heparin.   ID: Cefepime/flagyl. Afebrile. WBC normalized.   Fall - Further w/u per primary team. Rec PT/OT while here.  HTN Emphysema  GERD Glaucoma Migraines Hx of alcohol abuse Depression/Anxiety Current tobacco abuse    LOS: 0 days   Jillyn Ledger, [facility] 10/23/2022, 8:56 AM Please see Amion for pager number during day hours 7:00am-4:30pm

## 2022-10-23 NOTE — Hospital Course (Signed)
Sender Timothy [name] is a 57 y.o.male with a history of sigmoid colectomy for diverticulitis, multiple bowel resections, mesh removal, and abdominal abscess who was admitted to the [facility] Medicine Teaching Service at [facility] for abdominal abscess and meeting sepsis criteria. His hospital course is detailed below:  Abdominal Abscess Patient had extensive surgical history and complications. Before this admission patient had a sigmoid colectomy for diverticulitis.  He then had a Hartmann resection in 2013 and a colostomy takedown with hernia repair in 2014.  Recently on 10/04/2022 patient had drainage of an abdominal wall abscess and removal of mesh due to recurrent abdominal wall abscess.  At this time he also had a colon resection with anastomosis to fix the leak causing the abdominal wall abscess on 10/13/2022.  Patient was discharged home after having his surgery.    He re-presented to the ED on 4/2 due to nausea/vomiting/weakness thought due to possible infection of his abdominal surgical site wound related to a poorly fitting and leaking colostomy bag.  This wound was also found to have some dehiscence.  In the ED patient met sepsis criteria had leukocytosis and tachycardia but was afebrile without hypotension.  CT abdomen pelvis showed left lower quadrant collection though decreased from prior.  His abdominal wound also had increased erythema and induration.  On presentation abdominal wound had fecal matter and it.  Patient was started on cefepime and Flagyl.  IR was consulted for possible drainage of intra-abdominal wall abscess. It is technically possible; however, patient chose to manage abscess by more conservative approach through antibiotics. Pain was treated with oxycodone and Robaxin.  Wound care was managed by general surgery.  Patient greatly improved with fluids and antibiotics.  Blood cultures were negative for 2 days 4/4.  Patient was transitioned to oral doxycycline and Augmentin for a total of 28-day  course.  He will have follow-up with general surgery and will require a follow-up CT.  Anemia Hemoglobin on presentation was 12.6.  Patient was most likely hemoconcentrated at that point.  After receiving fluids patient's hemoglobin was 8.3 on 4/3.  Iron studies show possible anemia of chronic disease.  However patient also had a hemoglobin of 14 about a month ago before his multiple abdominal surgeries.  Thus, most likely patient has blood loss anemia compounded with anemia of chronic disease.  Thrombocytosis  Patient's platelets were 1055 on admission.  Most likely due to hemoconcentration.  After receiving fluids patient's platelets were 747.  Patient has had elevated platelets in the past going back to 2013.  Could be due to iron deficiency anemia or myeloproliferative process or paraneoplastic syndrome.  Peripheral smear with thrombocytosis, normal morphology.  Patient did have lung nodule on CT that will be followed as well.   Other chronic conditions were medically managed with home medications and formulary alternatives as necessary (COPD)  PCP Follow-up Recommendations: Outpatient CT to make sure abdominal fluid collection is getting smaller  Gen Surg follow up on 4/10  Will follow with wound ostomy team to make sure he is properly placing his ostomy bag.  Follow up anemia, should start to improve as he gets further out from his surgeries.  Lung Nodule on CT. Recommend follow up in 3 months.  Recommend repeat CBC, consider heme/onc referral if thrombocytosis is persistent

## 2022-10-24 ENCOUNTER — Other Ambulatory Visit (HOSPITAL_COMMUNITY): Payer: Self-pay

## 2022-10-24 ENCOUNTER — Other Ambulatory Visit: Payer: Self-pay | Admitting: Student

## 2022-10-24 DIAGNOSIS — A419 Sepsis, unspecified organism: Secondary | ICD-10-CM

## 2022-10-24 DIAGNOSIS — R110 Nausea: Secondary | ICD-10-CM

## 2022-10-24 DIAGNOSIS — S31109D Unspecified open wound of abdominal wall, unspecified quadrant without penetration into peritoneal cavity, subsequent encounter: Secondary | ICD-10-CM

## 2022-10-24 DIAGNOSIS — L02211 Cutaneous abscess of abdominal wall: Secondary | ICD-10-CM | POA: Diagnosis not present

## 2022-10-24 DIAGNOSIS — L089 Local infection of the skin and subcutaneous tissue, unspecified: Secondary | ICD-10-CM | POA: Diagnosis not present

## 2022-10-24 LAB — CBC WITH DIFFERENTIAL/PLATELET
Abs Immature Granulocytes: 0.03 10*3/uL (ref 0.00–0.07)
Basophils Absolute: 0.1 10*3/uL (ref 0.0–0.1)
Basophils Relative: 1 %
Eosinophils Absolute: 0.3 10*3/uL (ref 0.0–0.5)
Eosinophils Relative: 4 %
HCT: 24.1 % — ABNORMAL LOW (ref 39.0–52.0)
Hemoglobin: 7.6 g/dL — ABNORMAL LOW (ref 13.0–17.0)
Immature Granulocytes: 0 %
Lymphocytes Relative: 30 %
Lymphs Abs: 2.1 10*3/uL (ref 0.7–4.0)
MCH: 30.2 pg (ref 26.0–34.0)
MCHC: 31.5 g/dL (ref 30.0–36.0)
MCV: 95.6 fL (ref 80.0–100.0)
Monocytes Absolute: 0.7 10*3/uL (ref 0.1–1.0)
Monocytes Relative: 10 %
Neutro Abs: 3.8 10*3/uL (ref 1.7–7.7)
Neutrophils Relative %: 55 %
Platelets: 707 10*3/uL — ABNORMAL HIGH (ref 150–400)
RBC: 2.52 MIL/uL — ABNORMAL LOW (ref 4.22–5.81)
RDW: 15.2 % (ref 11.5–15.5)
WBC: 6.9 10*3/uL (ref 4.0–10.5)
nRBC: 0 % (ref 0.0–0.2)

## 2022-10-24 MED ORDER — AMOXICILLIN-POT CLAVULANATE 875-125 MG PO TABS
1.0000 | ORAL_TABLET | Freq: Two times a day (BID) | ORAL | 0 refills | Status: AC
  Filled 2022-10-24: qty 56, 28d supply, fill #0

## 2022-10-24 MED ORDER — AMOXICILLIN-POT CLAVULANATE 875-125 MG PO TABS
1.0000 | ORAL_TABLET | Freq: Two times a day (BID) | ORAL | 0 refills | Status: DC
  Filled 2022-10-24: qty 14, 7d supply, fill #0

## 2022-10-24 MED ORDER — DOXYCYCLINE HYCLATE 100 MG PO TABS
100.0000 mg | ORAL_TABLET | Freq: Two times a day (BID) | ORAL | 0 refills | Status: DC
  Filled 2022-10-24: qty 14, 7d supply, fill #0

## 2022-10-24 MED ORDER — DOXYCYCLINE HYCLATE 100 MG PO TABS
100.0000 mg | ORAL_TABLET | Freq: Two times a day (BID) | ORAL | 0 refills | Status: AC
  Filled 2022-10-24: qty 56, 28d supply, fill #0

## 2022-10-24 NOTE — Plan of Care (Signed)

## 2022-10-24 NOTE — Progress Notes (Signed)
Progress Note     Subjective: Feels much better.  Tolerated regular diet and had [employer] yesterday without any worsening abdominal pain, nausea or vomiting.  Left-sided abdominal pain is resolved.  After speaking with IR he does not want to proceed with IR drain placement.  Having ostomy output.  Voiding without issues.  Mobilize with PT yesterday for 400 feet, mod-ind with no assistive devices. No PT f/u recommended.   He plans to stay with his sone at d/c. Feels comfortable with midline dressing care.   Afebrile. No tachycardia. No hypotension. WBC normalized.   Objective: Vital signs in last 24 hours: Temp:  [97.9 F (36.6 C)-98.9 F (37.2 C)] 98.6 F (37 C) (04/04 0813) Pulse Rate:  [80-88] 80 (04/04 0813) Resp:  [19] 19 (04/04 0813) BP: (142-160)/(82-94) 160/94 (04/04 0813) SpO2:  [99 %-100 %] 99 % (04/04 0813) Last BM Date : 10/24/22  Intake/Output from previous day: 04/03 0701 - 04/04 0700 In: 600 [P.O.:600] Out: 3250 [Urine:3050; Stool:200] Intake/Output this shift: Total I/O In: 388.2 [P.O.:120; IV Piggyback:268.2] Out: 600 [Urine:600]  PE: General: pleasant, WD, WN male who is laying in bed in NAD Lungs: Respiratory effort nonlabored Abd: Soft, appropriately ttp around his incision. No rigidity or guarding. +BS. Stoma viable. Ostomy bag with some liquid stool in bag. Midline wound with fascial separation with fibrinous tissue in wound base - no visible bowel. Will discuss with MD. See picture below.    Lab Results:  Recent Labs    10/23/22 0826 10/23/22 1352 10/24/22 0103  WBC 8.7  --  6.9  HGB 8.3* 8.9* 7.6*  HCT 25.8* 28.7* 24.1*  PLT 747*  --  707*    BMET Recent Labs    10/22/22 1048 10/22/22 1055  NA 138 140  K 3.7 3.7  CL 103 104  CO2 22  --   GLUCOSE 120* 123*  BUN 10 12  CREATININE 0.90 0.90  CALCIUM 8.1*  --     PT/INR Recent Labs    10/22/22 1048  LABPROT 13.2  INR 1.0    CMP     Component Value Date/Time   NA 140  10/22/2022 1055   NA 137 07/25/2021 1253   K 3.7 10/22/2022 1055   CL 104 10/22/2022 1055   CO2 22 10/22/2022 1048   GLUCOSE 123 (H) 10/22/2022 1055   BUN 12 10/22/2022 1055   BUN 25 (H) 07/25/2021 1253   CREATININE 0.90 10/22/2022 1055   CREATININE 0.98 05/16/2016 1129   CALCIUM 8.1 (L) 10/22/2022 1048   PROT 7.1 10/22/2022 1048   PROT 6.8 11/11/2016 1135   ALBUMIN 2.3 (L) 10/22/2022 1048   ALBUMIN 4.2 11/11/2016 1135   AST 26 10/22/2022 1048   ALT 23 10/22/2022 1048   ALKPHOS 134 (H) 10/22/2022 1048   BILITOT 0.5 10/22/2022 1048   BILITOT <0.2 11/11/2016 1135   GFRNONAA >60 10/22/2022 1048   GFRNONAA >89 05/16/2016 1129   GFRAA 66 03/24/2020 1609   GFRAA >89 05/16/2016 1129   Lipase     Component Value Date/Time   LIPASE 33 02/02/2018 1654       Studies/Results: CT CHEST WO CONTRAST  Result Date: 10/22/2022 CLINICAL DATA:  Pulmonary nodules. EXAM: CT CHEST WITHOUT CONTRAST TECHNIQUE: Multidetector CT imaging of the chest was performed following the standard protocol without IV contrast. RADIATION DOSE REDUCTION: This exam was performed according to the departmental dose-optimization program which includes automated exposure control, adjustment of the mA and/or kV according to patient  size and/or use of iterative reconstruction technique. COMPARISON:  Earlier abdominal CT scan, same date and prior chest CT from 07/11/2022 FINDINGS: Cardiovascular: The heart is normal in size. No pericardial effusion. The aorta is normal in caliber. Stable scattered atherosclerotic calcifications. Stable age advanced three-vessel coronary artery calcifications. Mediastinum/Nodes: Scattered mediastinal and hilar lymph nodes are stable. The esophagus is grossly. Lungs/Pleura: Stable underlying emphysematous changes and pulmonary scarring. No focal infiltrates or pleural effusions. As demonstrated on the earlier abdominal CT scan there are irregular nodules at the left lung base. These were not  present on the prior CT scan from 2023 and are most likely inflammatory or infectious. Recommend short-term follow-up noncontrast chest CT in 3 months to reassess. Upper Abdomen: Pneumoperitoneum is seen on earlier abdominal CT scan. Musculoskeletal: No significant bony findings. IMPRESSION: 1. Stable emphysematous changes and pulmonary scarring. 2. As demonstrated on the earlier abdominal CT scan there are irregular nodules at the left lung base. These were not present on the prior CT scan from 2023 and are most likely inflammatory or infectious. Recommend short-term follow-up noncontrast chest CT in 3 months to reassess. 3. Stable age advanced three-vessel coronary artery calcifications. 4. Pneumoperitoneum is seen on earlier abdominal CT scan. Emphysema (ICD10-J43.9). Electronically Signed   By: Marijo Sanes M.D.   On: 10/22/2022 18:59   CT Abdomen Pelvis W Contrast  Result Date: 10/22/2022 CLINICAL DATA:  Follow up pneumoperitoneum EXAM: CT ABDOMEN AND PELVIS WITH CONTRAST TECHNIQUE: Multidetector CT imaging of the abdomen and pelvis was performed using the standard protocol following bolus administration of intravenous contrast. RADIATION DOSE REDUCTION: This exam was performed according to the departmental dose-optimization program which includes automated exposure control, adjustment of the mA and/or kV according to patient size and/or use of iterative reconstruction technique. CONTRAST:  12mL OMNIPAQUE IOHEXOL 350 MG/ML SOLN COMPARISON:  10/13/2022 FINDINGS: Lower chest: Bibasilar lung nodules largest measuring 1.2 cm on the right. No pericardial or pleural effusion. Hepatobiliary: No focal liver abnormality is seen. No gallstones, gallbladder wall thickening, or biliary dilatation. Pancreas: Unremarkable. No pancreatic ductal dilatation or surrounding inflammatory changes. Spleen: Normal in size without focal abnormality. Adrenals/Urinary Tract: Adrenal glands are unremarkable. Kidneys are normal,  without renal calculi, focal lesion, or hydronephrosis. Bladder is unremarkable. Stomach/Bowel: Mild dilatation of small bowel loops likely an ileus. Left lower quadrant colostomy with evidence of partial left hemicolectomy. Appendix not identified. Vascular/Lymphatic: Aortic atherosclerosis. No enlarged abdominal or pelvic lymph nodes. Reproductive: Prostate is unremarkable. Other: Significant decrease in pneumoperitoneum compared to the prior study. Left lower abdominal collection of fluid with [facility] of air measures 8 cm, significantly smaller than on the prior study. Musculoskeletal: Bilateral L5 spondylolysis. IMPRESSION: 1. Left abdominal fluid collection consistent with abscess has diminished in size compared to the prior study. 2. Mild small bowel dilatation likely representing ileus. 3. Significant decrease in pneumoperitoneum. Electronically Signed   By: Sammie Bench M.D.   On: 10/22/2022 14:49   CT Head Wo Contrast  Result Date: 10/22/2022 CLINICAL DATA:  Head trauma EXAM: CT HEAD WITHOUT CONTRAST TECHNIQUE: Contiguous axial images were obtained from the base of the skull through the vertex without intravenous contrast. RADIATION DOSE REDUCTION: This exam was performed according to the departmental dose-optimization program which includes automated exposure control, adjustment of the mA and/or kV according to patient size and/or use of iterative reconstruction technique. COMPARISON:  CT Head 08/21/12 FINDINGS: Brain: No evidence of acute infarction, hemorrhage, hydrocephalus, extra-axial collection or mass lesion/mass effect. Vascular: No hyperdense vessel or unexpected  calcification. Skull: Normal. Negative for fracture or focal lesion. Sinuses/Orbits: Small left mastoid effusion. No middle ear effusion. Mild mucosal thickening right sphenoid sinus and right maxillary sinus orbits are unremarkable Other: None. IMPRESSION: No CT evidence of intracranial injury. Electronically Signed   By: Marin Roberts M.D.   On: 10/22/2022 14:45   DG Chest 2 View  Result Date: 10/22/2022 CLINICAL DATA:  Possible sepsis, dizziness EXAM: CHEST - 2 VIEW COMPARISON:  10/14/2022 FINDINGS: Cardiac size is within normal limits. There is interval removal of endotracheal tube and NG tube. Lung fields are clear of any infiltrates or pulmonary edema. There is no pleural effusion or pneumothorax. There is significant interval decrease in amount of pneumoperitoneum under the right hemidiaphragm. IMPRESSION: There are no signs of pulmonary edema or focal pulmonary consolidation. There is significant interval decrease in amount of pneumoperitoneum under the right hemidiaphragm in comparison with the study of 10/14/2022. Electronically Signed   By: Elmer Picker M.D.   On: 10/22/2022 11:32    Anti-infectives: Anti-infectives (From admission, onward)    Start     Dose/Rate Route Frequency Ordered Stop   10/23/22 0746  metroNIDAZOLE (FLAGYL) IVPB 500 mg        500 mg 100 mL/hr over 60 Minutes Intravenous Every 12 hours 10/23/22 0747     10/22/22 2000  ceFEPIme (MAXIPIME) 2 g in sodium chloride 0.9 % 100 mL IVPB        2 g 200 mL/hr over 30 Minutes Intravenous Every 8 hours 10/22/22 1142     10/22/22 1700  metroNIDAZOLE (FLAGYL) IVPB 500 mg        500 mg 100 mL/hr over 60 Minutes Intravenous  Once 10/22/22 1647 10/22/22 1954   10/22/22 1145  ceFEPIme (MAXIPIME) 2 g in sodium chloride 0.9 % 100 mL IVPB        2 g 200 mL/hr over 30 Minutes Intravenous  Once 10/22/22 1140 10/22/22 1349   10/22/22 1145  metroNIDAZOLE (FLAGYL) IVPB 500 mg        500 mg 100 mL/hr over 60 Minutes Intravenous  Once 10/22/22 1140 10/22/22 1349       Patient is a 57 year old male with Hx of prior sigmoid colectomy for diverticulitis, anastomotic leak followed by Jeanette Caprice resection in 2013 and colostomy takedown with hernia repair in 2014. Recently underwent drainage of abdominal wall abscess and removal of mesh 10/04/22 after recurrent  abdominal wall abscess. Also had colon resection with anastomosis. Initially did well but then developed anastomotic leak and had to be taken back to the OR 10/13/22 for colon resection and colostomy. Mild post-operative ileus which resolved and patient completed a course of antibiotics peri-operatively. Patient was discharged 10/19/22.   Assessment/Plan S/P ex-lap, drainage of abdominal wall abscess, removal of mesh, segmental colon resection and LOA 10/04/22 Dr. Marlou Starks Anastomotic leak s/p ex-lap, resection of colon anastomosis and end colostomy 10/13/22 Dr. Ninfa Linden - Recently discharged 3/30, returned to ED today after fall at home.  - CT AP shows decreased LLQ collection. Patient has elected to hold off on IR drainage. This is reasonable. He is clinically improved, afebrile and wbc wnl.  - Recommend mepitel in wound base and BID wet to dry dressing changes. Recommend abdominal binder when oob to try to prevent further dehiscence  - WOC following for stomal care. Referral to ostomy clinic made.  - Would cont abx x 1 week. He has f/u with Dr. Marlou Starks on 4/10.  - Mobilize, pulm toilet  FEN: Soft diet, IVF per primary VTE: SCDs, Subq heparin. ID: Cefepime/flagyl. Afebrile. WBC normalized.   Fall - Further w/u per primary team.  HTN Emphysema  GERD Glaucoma Migraines Hx of alcohol abuse Depression/Anxiety Current tobacco abuse    LOS: 1 day   Jillyn Ledger, [facility] Surgery 10/24/2022, 10:25 AM Please see Amion for pager number during day hours 7:00am-4:30pm

## 2022-10-24 NOTE — Progress Notes (Signed)
     Daily Progress Note Intern Pager: [phone]  Patient name: Timothy [name] Medical record number: [id number] Date of birth: June 15, 1966 Age: 57 y.o. Gender: male  Primary Care Provider: Gerrit Heck, MD Consultants: General Surgery  Code Status: Full    Pt Overview and Major Events to Date:  4/2 - Admitted, started on Cefepime and Flagyl    Assessment and Plan: Krithik Padmanabhan is a 57 y.o. male who presented with dizziness in the setting of vomiting. Found to meet sepsis criteria most likely due to abdominal wall abscess or abdominal soft tissue site infection.   * Sepsis Blood culture negative for 2 days. WBC improved and normal. Patient feels well.  - Transition to po antibiotics  - Follow up with general surgery outpatient  - Wound care per general surgery recommendations - Abdominal binder to prevent wound dehiscence - Continue Oxycodone 5 mg q 6 hours prn  - Continue home robaxin   Lung nodule seen on imaging study Mention of bibasilar lung nodules up to 1.2cm on the Right. Note that this is a new finding when compared to CT scan from just four months ago. With him reporting shortness of breath and new thrombocytosis, certainly could represent a new lung cancer in patient with heavy smoking history. Finding is consistent on CT Chest.  - Follow up with 3 month CT outpatient.   Thrombocytosis Platelets 707 today. Most likely hemoconcentrated on arrival. Concern for myeloproliferative process, paraneoplastic process, or acute phase reactant.  - Pathologist smear review still pending  - Follow up outpatient   Bipolar disorder Not on a mood stabilizer at this time, but reports he is well controlled on Sertaline and Buspar. - Continue home sertraline and buspar   Chronic obstructive pulmonary disease No wheeze on exam. - Continue home Anoro ellipta daily and albuterol as needed   FEN/GI: Full diet  PPx: heparin  Dispo:Home.   Subjective:  Patient says he feels much  better and would like to go home. He says that the ostomy team was very helpful and he now knows how to keep his ostomy bag properly in place.   Objective: Temp:  [97.9 F (36.6 C)-98.9 F (37.2 C)] 98.6 F (37 C) (04/04 0813) Pulse Rate:  [80-88] 80 (04/04 0813) Resp:  [19] 19 (04/04 0813) BP: (142-160)/(82-94) 160/94 (04/04 0813) SpO2:  [99 %-100 %] 99 % (04/04 0813) Physical Exam: General: well appearing, in no acute distress CV: RRR, radial pulses equal and palpable, cap refill < 2 seconds, no BLE edema  Resp: Normal work of breathing on room air Abd: Soft, non tender, non distended, ventral wound dressed and dry, colostomy with brown stool output   Neuro: Alert & Oriented x 4   Laboratory: Most recent CBC Lab Results  Component Value Date   WBC 6.9 10/24/2022   HGB 7.6 (L) 10/24/2022   HCT 24.1 (L) 10/24/2022   MCV 95.6 10/24/2022   PLT 707 (H) 10/24/2022    Lowry Ram, MD 10/24/2022, 12:14 PM  PGY-1, [place] Intern pager: [phone], text pages welcome Secure chat group [place]

## 2022-10-24 NOTE — Progress Notes (Signed)
Patient is being discharged home with his daughter in-law. Both of his PIV out and site looks dry, intact. All of his belongings, AVS paper and colostomy suppliers were handed to the patient.

## 2022-10-24 NOTE — Discharge Summary (Signed)
[facility] Discharge Summary  Patient name: Timothy [name] Medical record number: [id number] Date of birth: 05-Nov-1965 Age: 57 y.o. Gender: male Date of Admission: 10/22/2022  Date of Discharge: 10/24/22  Admitting Physician: Eppie Gibson, MD  Primary Care Provider: Gerrit Heck, MD Consultants: General Surgery   Indication for Hospitalization: Sepsis   Discharge Diagnoses/Problem List:  Principal Problem for Admission: Intra-Abdominal Wall Abscess Other Problems addressed during stay:  Principal Problem:   Sepsis Active Problems:   Lung nodule seen on imaging study   Thrombocytosis   Bipolar disorder   Chronic obstructive pulmonary disease   Intra-abdominal abscess   Abdominal wall abscess   Chronic wound infection of abdomen    Brief Hospital Course:  Timothy [name] is a 57 y.o.male with a history of sigmoid colectomy for diverticulitis, multiple bowel resections, mesh removal, and abdominal abscess who was admitted to the [facility] Medicine Teaching Service at [facility] for abdominal abscess and meeting sepsis criteria. His hospital course is detailed below:  Abdominal Abscess Patient had extensive surgical history and complications. Before this admission patient had a sigmoid colectomy for diverticulitis.  He then had a Hartmann resection in 2013 and a colostomy takedown with hernia repair in 2014.  Recently on 10/04/2022 patient had drainage of an abdominal wall abscess and removal of mesh due to recurrent abdominal wall abscess.  At this time he also had a colon resection with anastomosis to fix the leak causing the abdominal wall abscess on 10/13/2022.  Patient was discharged home after having his surgery.    He re-presented to the ED on 4/2 due to nausea/vomiting/weakness thought due to possible infection of his abdominal surgical site wound related to a poorly fitting and leaking colostomy bag.  This wound was also found to have some dehiscence.   In the ED patient met sepsis criteria had leukocytosis and tachycardia but was afebrile without hypotension.  CT abdomen pelvis showed left lower quadrant collection though decreased from prior.  His abdominal wound also had increased erythema and induration.  On presentation abdominal wound had fecal matter and it.  Patient was started on cefepime and Flagyl.  IR was consulted for possible drainage of intra-abdominal wall abscess. It is technically possible; however, patient chose to manage abscess by more conservative approach through antibiotics. Pain was treated with oxycodone and Robaxin.  Wound care was managed by general surgery.  Patient greatly improved with fluids and antibiotics.  Blood cultures were negative for 2 days 4/4.  Patient was transitioned to oral doxycycline and Augmentin for a total of 28-day course.  He will have follow-up with general surgery and will require a follow-up CT.  Anemia Hemoglobin on presentation was 12.6.  Patient was most likely hemoconcentrated at that point.  After receiving fluids patient's hemoglobin was 8.3 on 4/3.  Iron studies show possible anemia of chronic disease.  However patient also had a hemoglobin of 14 about a month ago before his multiple abdominal surgeries.  Thus, most likely patient has blood loss anemia compounded with anemia of chronic disease.  Thrombocytosis  Patient's platelets were 1055 on admission.  Most likely due to hemoconcentration.  After receiving fluids patient's platelets were 747.  Patient has had elevated platelets in the past going back to 2013.  Could be due to iron deficiency anemia or myeloproliferative process or paraneoplastic syndrome.  Peripheral smear with thrombocytosis, normal morphology.  Patient did have lung nodule on CT that will be followed as well.   Other chronic conditions  were medically managed with home medications and formulary alternatives as necessary (COPD)  PCP Follow-up Recommendations: Outpatient CT  to make sure abdominal fluid collection is getting smaller  Gen Surg follow up on 4/10  Will follow with wound ostomy team to make sure he is properly placing his ostomy bag.  Follow up anemia, should start to improve as he gets further out from his surgeries.  Lung Nodule on CT. Recommend follow up in 3 months.  Recommend repeat CBC, consider heme/onc referral if thrombocytosis is persistent    Disposition: Home  Discharge Condition: stable  Discharge Exam:  Vitals:   10/23/22 1945 10/24/22 0813  BP: (!) 155/88 (!) 160/94  Pulse: 88 80  Resp:  19  Temp: 98.9 F (37.2 C) 98.6 F (37 C)  SpO2: 100% 99%   General: well appearing, in no acute distress CV: RRR, radial pulses equal and palpable, cap refill < 2 seconds, no BLE edema  Resp: Normal work of breathing on room air Abd: Soft, non tender, non distended, ventral wound dressed and dry, colostomy with brown stool output   Neuro: Alert & Oriented x 4    Significant Procedures: None  Significant Labs and Imaging:  Recent Labs  Lab 10/23/22 0826 10/23/22 1352 10/24/22 0103  WBC 8.7  --  6.9  HGB 8.3* 8.9* 7.6*  HCT 25.8* 28.7* 24.1*  PLT 747*  --  707*   No results for input(s): "NA", "K", "CL", "CO2", "GLUCOSE", "BUN", "CREATININE", "CALCIUM", "MG", "PHOS", "ALKPHOS", "AST", "ALT", "ALBUMIN", "PROTEIN" in the last 48 hours.  CT Abdomen Pelvis w Contrast  1. Left abdominal fluid collection consistent with abscess has diminished in size compared to the prior study. 2. Mild small bowel dilatation likely representing ileus. 3. Significant decrease in pneumoperitoneum.  CT Chest wo Contrast  1. Stable emphysematous changes and pulmonary scarring. 2. As demonstrated on the earlier abdominal CT scan there are irregular nodules at the left lung base. These were not present on the prior CT scan from 2023 and are most likely inflammatory or infectious. Recommend short-term follow-up noncontrast chest CT in 3 months to  reassess. 3. Stable age advanced three-vessel coronary artery calcifications. 4. Pneumoperitoneum is seen on earlier abdominal CT scan.   Emphysema (ICD10-J43.9).   Discharge Medications:  Allergies as of 10/24/2022       Reactions   Ambien [zolpidem Tartrate] Other (See Comments)   Agitation   Bactroban [mupirocin Calcium] Swelling   Caused infection and swelling at a surgical site   Zestril [lisinopril] Cough   Darvon [propoxyphene] Itching   Morphine And Related Itching        Medication List     STOP taking these medications    GOODY HEADACHE PO   ondansetron 4 MG disintegrating tablet Commonly known as: ZOFRAN-ODT   pantoprazole 40 MG tablet Commonly known as: PROTONIX   varenicline 0.5 MG tablet Commonly known as: CHANTIX       TAKE these medications    albuterol 108 (90 Base) MCG/ACT inhaler Commonly known as: VENTOLIN HFA Inhale 2 puffs into the lungs every 6 (six) hours as needed for wheezing or shortness of breath.   amoxicillin-clavulanate 875-125 MG tablet Commonly known as: AUGMENTIN Take 1 tablet by mouth 2 (two) times daily for 28 days.   Anoro Ellipta 62.5-25 MCG/ACT Aepb Generic drug: umeclidinium-vilanterol Inhale 1 puff into the lungs daily.   busPIRone 15 MG tablet Commonly known as: BUSPAR TAKE 1 TABLET BY MOUTH IN THE MORNING. What changed: when  to take this   doxycycline 100 MG tablet Commonly known as: VIBRA-TABS Take 1 tablet (100 mg total) by mouth 2 (two) times daily for 28 days.   gabapentin 100 MG capsule Commonly known as: NEURONTIN TAKE 1 CAPSULE BY MOUTH FOUR TIMES DAILY AS NEEDED What changed:  how much to take how to take this when to take this reasons to take this additional instructions   losartan 100 MG tablet Commonly known as: COZAAR TAKE 1 TABLET (100 MG TOTAL) BY MOUTH DAILY.   methocarbamol 500 MG tablet Commonly known as: ROBAXIN Take 1 tablet (500 mg total) by mouth every 8 (eight) hours as  needed for muscle spasms.   oxyCODONE 5 MG immediate release tablet Commonly known as: Oxy IR/ROXICODONE Take 1 tablet (5 mg total) by mouth every 6 (six) hours as needed for moderate pain.   potassium chloride 10 MEQ tablet Commonly known as: KLOR-CON M Take 4 tablets (40 mEq total) by mouth daily for 5 days.   sertraline 100 MG tablet Commonly known as: ZOLOFT TAKE 1 TABLET (100 MG TOTAL) BY MOUTH DAILY.        Discharge Instructions: Please refer to Patient Instructions section of EMR for full details.  Patient was counseled important signs and symptoms that should prompt return to medical care, changes in medications, dietary instructions, activity restrictions, and follow up appointments.   Follow-Up Appointments:  Follow-up [place], [facility] Follow up.   Why: Home health has been arranged. They will contact you within 48hours post discharge to schedule apt. Contact information: [address] [place] [address] [phone]         Jovita Kussmaul, MD Follow up.   Specialty: General Surgery Why: Please go to follow up on 4/10. Contact information: [address] [place] [address] [phone]         Gerrit Heck, MD Follow up on 10/28/2022.   Specialty: Family Medicine Why: You have an appointment with Dr. Jinny Sanders at 1:50pm on 4/8. Please arrive 15 minutes early. Contact information: [place] [address] [phone]                 Lowry Ram, MD 10/24/2022, 1:37 PM PGY-1, [place]   I have evaluated this patient along with Dr. Gwendolyn Lima and reviewed the above note, making necessary revisions.  Pearla Dubonnet, MD 10/24/2022, 1:37 PM PGY-2, [place] [facility]  J Pearla Dubonnet, MD

## 2022-10-24 NOTE — Discharge Instructions (Addendum)
   Wet to Dry WOUND CARE: - Change dressing twice daily - Supplies: sterile saline, kerlex, scissors, ABD pads, tape  Remove dressing and all packing carefully, moistening with sterile saline as needed to avoid packing/internal dressing sticking to the wound. 2.   Clean edges of skin around the wound with water/gauze, making sure there is no tape debris or leakage left on skin that could cause skin irritation or breakdown. - Apply mepitel in wound base (as long as mepitel is not contaminated - okay to use same sheet for 72 hrs).  3.   Dampen and clean kerlex with sterile saline and pack wound from wound base to skin level, making sure to take note of any possible areas of wound tracking, tunneling and packing appropriately. Wound can be packed loosely. Trim kerlex to size if a whole kerlex is not required. 4.   Cover wound with a dry ABD pad and secure with tape.  5.   Write the date/time on the dry dressing/tape to better track when the last dressing change occurred. - apply any skin protectant/powder if recommended by clinician to protect skin/skin folds. - change dressing as needed if leakage occurs, wound gets contaminated, or patient requests to shower. - You may shower daily with wound open and following the shower the wound should be dried and a clean dressing placed.  - Medical grade tape as well as packing supplies can be found at [employer] on [address] or [employer] on [address]. The remaining supplies can be found at your local drug store, [place] etc.    Dear Timothy [name],   Thank you so much for allowing [place] to be part of your care!  You were admitted to [facility] for your abdominal infection. You were treated with IV antibiotics while admitted and were transitioned to oral antibiotics once improved. You will need to follow up with your general surgeon and get a follow up CT. You will also need to follow up with your PCP to follow up on  your anemia, and the nodule that was found in your lung. If you develop a new fever or worsening symptoms please call our clinic for closer follow-up or, of course, you can always re-present to ED if you need to.    POST-HOSPITAL & CARE INSTRUCTIONS Please continue your antibiotics as prescribed.  Follow up with your general surgeon and PCP  Please let PCP/Specialists know of any changes that were made.  Please see medications section of this packet for any medication changes.   DOCTOR'S APPOINTMENT & FOLLOW UP CARE INSTRUCTIONS  Future Appointments  Date Time Provider [place]  10/28/2022  1:50 PM Gerrit Heck, MD [facility]  07/21/2023  2:00 PM [facility] ANNUAL WELLNESS VISIT [facility] [place]    RETURN PRECAUTIONS:   Take care and be well!  [facility]  [place], [place] [address] [phone]

## 2022-10-25 ENCOUNTER — Telehealth: Payer: Self-pay

## 2022-10-25 NOTE — Transitions of Care (Post Inpatient/ED Visit) (Signed)
   10/25/2022  Name: Timothy [name] MRN: [id number] DOB: 1966/06/10  Today's TOC FU Call Status: Today's TOC FU Call Status:: Successful TOC FU Call Competed TOC FU Call Complete Date: 10/25/22  Transition Care Management Follow-up Telephone Call Date of Discharge: 10/24/22 Discharge Facility: [facility]) Type of Discharge: Inpatient Admission Primary Inpatient Discharge Diagnosis:: sepsis How have you been since you were released from the hospital?: Better Any questions or concerns?: No  Items Reviewed: Did you receive and understand the discharge instructions provided?: Yes Medications obtained and verified?: Yes (Medications Reviewed) Any new allergies since your discharge?: No Dietary orders reviewed?: Yes Do you have support at home?: Yes People in Home: child(ren), adult  Home Care and Equipment/Supplies: Were Home Health Services Ordered?: Yes Name of Home Health Agency:: [facility] Has Agency set up a time to come to your home?: No Any new equipment or medical supplies ordered?: Yes Name of Medical supply agency?: unknown Were you able to get the equipment/medical supplies?: Yes Do you have any questions related to the use of the equipment/supplies?: No  Functional Questionnaire: Do you need assistance with bathing/showering or dressing?: Yes Do you need assistance with meal preparation?: No Do you need assistance with eating?: No Do you have difficulty maintaining continence: No Do you need assistance with getting out of bed/getting out of a chair/moving?: Yes Do you have difficulty managing or taking your medications?: No  Follow up appointments reviewed: PCP Follow-up appointment confirmed?: Yes Date of PCP follow-up appointment?: 10/28/22 Specialist Hospital Follow-up appointment confirmed?: Yes Date of Specialist follow-up appointment?: 10/29/22 Follow-Up Specialty Provider:: DrToth Do you need transportation to your follow-up appointment?: No Do you  understand care options if your condition(s) worsen?: Yes-patient verbalized understanding    SIGNATURE Karena Addison, LPN [facility] Nurse Health Advisor Direct Dial [phone]

## 2022-10-27 LAB — CULTURE, BLOOD (ROUTINE X 2)
Culture: NO GROWTH
Special Requests: ADEQUATE

## 2022-10-27 NOTE — Progress Notes (Unsigned)
    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 Had ex-lap with removal of his hernia mesh + segmental colon resection + colostomy placement. Then developed fever and had leak with need for emergent ex lap. Post operative ventilator dependence and required ICU stay for 1 night. Tx with vanco>linezolid and zosyn.   4/2-4/4-readmission due to meeting sepsis criteria + confusion on arrival. IR consulted due to LLQ collection on imaging - pt opted for medical management. Transitioned from cefepime and flagyl to oral doxycycline and augment for 28 day total course. Gen surg f/u on 4/10  PCP Follow-up Recommendations: Outpatient CT to make sure abdominal fluid collection is getting smaller -will defer to general surgery for timeline Gen Surg follow up on 4/10 - patient aware Will follow with wound ostomy team to make sure he is properly placing his ostomy bag. - *** Follow up anemia, should start to improve as he gets further out from his surgeries. - obtain CBC today Lung Nodule on CT. Recommend follow up in 3 months. - plan for 3 month f/u Recommend repeat CBC, consider heme/onc referral if thrombocytosis is persistent  - obtain CBC today   PERTINENT  PMH / PSH: ***  OBJECTIVE:   There were no vitals taken for this visit.  ***  ASSESSMENT/PLAN:   No problem-specific Assessment & Plan notes found for this encounter.     [name], MD [facility]

## 2022-10-28 ENCOUNTER — Ambulatory Visit (INDEPENDENT_AMBULATORY_CARE_PROVIDER_SITE_OTHER): Payer: Medicare Other | Admitting: Student

## 2022-10-28 ENCOUNTER — Encounter: Payer: Self-pay | Admitting: Student

## 2022-10-28 VITALS — BP 144/93 | HR 90 | Ht 69.0 in | Wt 149.6 lb

## 2022-10-28 DIAGNOSIS — Z939 Artificial opening status, unspecified: Secondary | ICD-10-CM | POA: Diagnosis not present

## 2022-10-28 DIAGNOSIS — R911 Solitary pulmonary nodule: Secondary | ICD-10-CM | POA: Diagnosis not present

## 2022-10-28 DIAGNOSIS — R110 Nausea: Secondary | ICD-10-CM

## 2022-10-28 DIAGNOSIS — D75839 Thrombocytosis, unspecified: Secondary | ICD-10-CM

## 2022-10-28 DIAGNOSIS — L02211 Cutaneous abscess of abdominal wall: Secondary | ICD-10-CM | POA: Diagnosis not present

## 2022-10-28 MED ORDER — ONDANSETRON 4 MG PO TBDP
4.0000 mg | ORAL_TABLET | Freq: Three times a day (TID) | ORAL | 0 refills | Status: DC | PRN
Start: 2022-10-28 — End: 2022-11-14

## 2022-10-28 NOTE — Patient Instructions (Signed)
It was great to see you! Thank you for allowing me to participate in your care!   Our plans for today:  - I have sent zofran in for you - F/u with general surgery on 4/10 - Get CBC today - will let you know what the results are  Take care and seek immediate care sooner if you develop any concerns.  [name], MD

## 2022-10-28 NOTE — Assessment & Plan Note (Signed)
Repeat CT imaging in 3 months which I discussed with patient. -Follow-up in July for ordering CT

## 2022-10-28 NOTE — Assessment & Plan Note (Signed)
S/p 2 ex lap surgeries.  He is going to follow-up with surgery in 2 days.  He has some vomiting after eating a lot of food but overall is doing well.  Good output and is changing this well.  Home health nurses coming by tomorrow to help as well.  He is has good help with his son at home. -Zofran 4 mg as needed ordered -Follow-up with general surgery -Will need repeat CT imaging per general surgery

## 2022-10-28 NOTE — Assessment & Plan Note (Signed)
CBC with Hgb of 7.6 on last check as well as thrombocytosis.  Will check CBC today to see if stable.  Feeling fatigued today however denies any bloody emesis or stools. -CBC -Consider hematology referral if thrombocytosis is persistent

## 2022-10-29 LAB — CBC WITH DIFFERENTIAL/PLATELET
Basophils Absolute: 0.1 10*3/uL (ref 0.0–0.2)
Basos: 1 %
EOS (ABSOLUTE): 0.1 10*3/uL (ref 0.0–0.4)
Eos: 1 %
Hematocrit: 28.9 % — ABNORMAL LOW (ref 37.5–51.0)
Hemoglobin: 9.1 g/dL — ABNORMAL LOW (ref 13.0–17.7)
Immature Grans (Abs): 0.1 10*3/uL (ref 0.0–0.1)
Immature Granulocytes: 1 %
Lymphocytes Absolute: 2.3 10*3/uL (ref 0.7–3.1)
Lymphs: 20 %
MCH: 29.9 pg (ref 26.6–33.0)
MCHC: 31.5 g/dL (ref 31.5–35.7)
MCV: 95 fL (ref 79–97)
Monocytes Absolute: 0.7 10*3/uL (ref 0.1–0.9)
Monocytes: 6 %
Neutrophils Absolute: 7.9 10*3/uL — ABNORMAL HIGH (ref 1.4–7.0)
Neutrophils: 71 %
Platelets: 929 10*3/uL (ref 150–450)
RBC: 3.04 x10E6/uL — ABNORMAL LOW (ref 4.14–5.80)
RDW: 14.4 % (ref 11.6–15.4)
WBC: 11.1 10*3/uL — ABNORMAL HIGH (ref 3.4–10.8)

## 2022-10-30 ENCOUNTER — Telehealth: Payer: Self-pay | Admitting: Student

## 2022-10-30 NOTE — Telephone Encounter (Signed)
Called patient x 4 no answer. Left VM to call back office during office hours. Will attempt again tomorrow. I would like to discuss lab results with him further and after discussing with attending Dr. Manson Passey best route would be sending him for a hematology evaluation. Please alert me if patient calls clinic back.

## 2022-10-31 ENCOUNTER — Telehealth: Payer: Self-pay | Admitting: Student

## 2022-10-31 DIAGNOSIS — D75839 Thrombocytosis, unspecified: Secondary | ICD-10-CM

## 2022-10-31 NOTE — Telephone Encounter (Signed)
Called Timothy [name] x 2 and no answer. I called Timothy [name]-confirmed his telephone number. Discussed with her to let him know that I was calling about lab results.  Called patient again at 8:35 AM- discussed that his platelets are quite elevated-likely reactive in setting of recent infection but that I would like him to see hematology (after discussion with attending Dr. Manson Passey) to rule out any dangerous causes. He is amenable to this.

## 2022-11-05 ENCOUNTER — Telehealth: Payer: Self-pay | Admitting: Hematology

## 2022-11-05 NOTE — Telephone Encounter (Signed)
scheduled per 4/15 referral , pt has been called and confirmed date and time. Pt is aware of location and to arrive early for check in   

## 2022-11-06 ENCOUNTER — Other Ambulatory Visit: Payer: Self-pay | Admitting: Student

## 2022-11-06 DIAGNOSIS — I10 Essential (primary) hypertension: Secondary | ICD-10-CM

## 2022-11-07 ENCOUNTER — Inpatient Hospital Stay: Payer: Medicare Other

## 2022-11-07 ENCOUNTER — Inpatient Hospital Stay: Payer: Medicare Other | Attending: Hematology | Admitting: Hematology

## 2022-11-07 ENCOUNTER — Other Ambulatory Visit: Payer: Self-pay

## 2022-11-07 VITALS — BP 139/86 | HR 99 | Temp 98.1°F | Resp 17 | Wt 147.5 lb

## 2022-11-07 DIAGNOSIS — D509 Iron deficiency anemia, unspecified: Secondary | ICD-10-CM | POA: Insufficient documentation

## 2022-11-07 DIAGNOSIS — F17210 Nicotine dependence, cigarettes, uncomplicated: Secondary | ICD-10-CM | POA: Insufficient documentation

## 2022-11-07 DIAGNOSIS — R61 Generalized hyperhidrosis: Secondary | ICD-10-CM | POA: Insufficient documentation

## 2022-11-07 DIAGNOSIS — R911 Solitary pulmonary nodule: Secondary | ICD-10-CM | POA: Insufficient documentation

## 2022-11-07 DIAGNOSIS — D75839 Thrombocytosis, unspecified: Secondary | ICD-10-CM

## 2022-11-07 DIAGNOSIS — R197 Diarrhea, unspecified: Secondary | ICD-10-CM | POA: Diagnosis not present

## 2022-11-07 LAB — CMP (CANCER CENTER ONLY)
ALT: 12 U/L (ref 0–44)
AST: 14 U/L — ABNORMAL LOW (ref 15–41)
Albumin: 3.8 g/dL (ref 3.5–5.0)
Alkaline Phosphatase: 114 U/L (ref 38–126)
Anion gap: 10 (ref 5–15)
BUN: 13 mg/dL (ref 6–20)
CO2: 22 mmol/L (ref 22–32)
Calcium: 9.4 mg/dL (ref 8.9–10.3)
Chloride: 107 mmol/L (ref 98–111)
Creatinine: 0.83 mg/dL (ref 0.61–1.24)
GFR, Estimated: >60 mL/min (ref >60)
Glucose, Bld: 92 mg/dL (ref 70–99)
Potassium: 4 mmol/L (ref 3.5–5.1)
Sodium: 139 mmol/L (ref 135–145)
Total Bilirubin: 0.2 mg/dL — ABNORMAL LOW (ref 0.3–1.2)
Total Protein: 7.5 g/dL (ref 6.5–8.1)

## 2022-11-07 LAB — CBC WITH DIFFERENTIAL (CANCER CENTER ONLY)
Abs Immature Granulocytes: 0.02 10*3/uL (ref 0.00–0.07)
Basophils Absolute: 0.1 10*3/uL (ref 0.0–0.1)
Basophils Relative: 1 %
Eosinophils Absolute: 0.3 10*3/uL (ref 0.0–0.5)
Eosinophils Relative: 3 %
HCT: 35 % — ABNORMAL LOW (ref 39.0–52.0)
Hemoglobin: 11 g/dL — ABNORMAL LOW (ref 13.0–17.0)
Immature Granulocytes: 0 %
Lymphocytes Relative: 23 %
Lymphs Abs: 2 10*3/uL (ref 0.7–4.0)
MCH: 29.6 pg (ref 26.0–34.0)
MCHC: 31.4 g/dL (ref 30.0–36.0)
MCV: 94.1 fL (ref 80.0–100.0)
Monocytes Absolute: 0.7 10*3/uL (ref 0.1–1.0)
Monocytes Relative: 8 %
Neutro Abs: 5.4 10*3/uL (ref 1.7–7.7)
Neutrophils Relative %: 65 %
Platelet Count: 658 10*3/uL — ABNORMAL HIGH (ref 150–400)
RBC: 3.72 MIL/uL — ABNORMAL LOW (ref 4.22–5.81)
RDW: 15.7 % — ABNORMAL HIGH (ref 11.5–15.5)
WBC Count: 8.5 10*3/uL (ref 4.0–10.5)
nRBC: 0 % (ref 0.0–0.2)

## 2022-11-07 LAB — IRON AND IRON BINDING CAPACITY (CC-WL,HP ONLY)
Iron: 42 ug/dL — ABNORMAL LOW (ref 45–182)
Saturation Ratios: 9 % — ABNORMAL LOW (ref 17.9–39.5)
TIBC: 463 ug/dL — ABNORMAL HIGH (ref 250–450)
UIBC: 421 ug/dL — ABNORMAL HIGH (ref 117–376)

## 2022-11-07 LAB — SEDIMENTATION RATE: Sed Rate: 51 mm/hr — ABNORMAL HIGH (ref 0–16)

## 2022-11-07 LAB — FERRITIN: Ferritin: 66 ng/mL (ref 24–336)

## 2022-11-07 LAB — VITAMIN B12: Vitamin B-12: 268 pg/mL (ref 180–914)

## 2022-11-07 NOTE — Progress Notes (Signed)
HEMATOLOGY/ONCOLOGY CONSULTATION NOTE  Date of Service: 11/07/2022  Patient Care Team: [name], MD as PCP - General (Family Medicine) Corky Crafts, MD as PCP - Cardiology (Cardiology)  CHIEF COMPLAINTS/PURPOSE OF CONSULTATION:  Evaluation and management of thrombocytosis  HISTORY OF PRESENTING ILLNESS:   Timothy [name] is a wonderful 57 y.o. male who has been referred to [place] by [name], MD for evaluation and management of thrombocytosis.  Patient was admitted recently from 10/04/2018 for through 10/19/2018 for 4 drainage of an intra-abdominal abscess due to anastomotic leak requiring exploratory laparotomy. Patient's platelet counts were normal as of  10/10/2022 and started increasing after his exploratory laparotomy with resection of colon anastomosis and end colostomy done on 10/09/2022 and went up to as high as just above a million (1055k). Platelet counts today have come back down to 658k without any specific platelet directed interventions probably as a function of resolving abscess and surgery related reactionary increase in platelets.  Today, he is accompanied by his daughter-in-law. He reports that he has had 13 total surgeries to treat his abdominal abscess. He continues to take oral antibiotics, and will do so for a total of 28 days.  Patient does complain of having diarrhea this morning. Patient denies any testicular pain/swelling  or leg edema. Patient does report a skin rash on the back of his right thigh.  He denies any previous liver issues. He reports that he previously consumed "heavy amounts" of alcohol prior to his surgery. However, he no longer consumes alcohol due to finding it to have a poor taste.   Patient continues to smoke slightly over one pack a day. He did previously smoke 2 packs a day prior to surgery.   In regards to patient's weight, he reports weighing 220 pounds a decade ago. His weight was 170 pounds at the time of his surgery,  and his current weight is 147 pounds. Patient reports that his appetite has improved.   Patient reports that he does sometimes wake up with night sweats, which he reports is normal. He also endorses fluctuating chills.   He denies any other infections, nose bleeds, blood in urine, gum bleeds or bone pain. He denies any reason for spleen injury. He continues to use Albuterol.   MEDICAL HISTORY:  Past Medical History:  Diagnosis Date   Acute bronchitis 02/15/2013   Alcohol abuse    Anxiety    Bipolar disorder    Bronchitis    history   Bronchitis    Cellulitis    - left knee-2009   Chronic pain    Complication of anesthesia    Condyloma 02/04/2012   Depression    Diverticulosis    By colonoscopy June 2005   Dyspnea    on exertion at times   Elevated CK 09/2011   Emphysema lung    Emphysema of lung    Family history of anesthesia complication    Mother N/V   GERD (gastroesophageal reflux disease)    Glaucoma syndrome    does not use eye drops, "They burn".   Headache(784.0)    "alot; not daily" (07/23/2012)   Heavy smoker    Hemorrhoids    History of colostomy 03/21/2012   Left colectomy and anastomosis performed by Chevis Pretty, MD on 02/18/13 for recurrent diverticulitis Exploratory laparotomy preformed 02/27/12 for anastomotic leak and Hartman pouch/colostomy peformed Colostomy taken down January 2014   History of diverticulitis of colon 10/16/2011   History of dizziness    History of  stomach ulcers 2005   HPV (human papilloma virus) infection    Hypertension    started 3 months ago   Hypoglycemia    Incisional hernia    Lower GI bleed    June 2005. Presumably secondary to diverticulosis.   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   Ventral hernia 01/11/2013   Repaired with mesh 05/20/13   Wrist pain 01/26/2019    SURGICAL HISTORY: Past Surgical History:  Procedure Laterality Date    ABDOMINAL EXPLORATION SURGERY  07/23/2012   w/LOA (07/23/2012)   APPENDECTOMY  1982   COLON RESECTION  02/27/2012   Procedure: COLON RESECTION;  Surgeon: Robyne Askew, MD;  Location: [facility] OR;  Service: General;  Laterality: N/A;   COLON RESECTION N/A 10/04/2022   Procedure: SEGMENTAL COLON RESECTION;  Surgeon: Griselda Miner, MD;  Location[facility] OR;  Service: General;  Laterality: N/A;   COLONOSCOPY  10/13/2022   Procedure: RESECTION OF COLON ANASTOMOSIS AND END COLOSCOPY;  Surgeon: Rodman Pickle, MD;  Location: [facility] OR;  Service: General;;   COLOSTOMY  02/27/2012   Procedure: COLOSTOMY;  Surgeon: Robyne Askew, MD;  Location: [facility] OR;  Service: General;  Laterality: N/A;   COLOSTOMY TAKEDOWN  07/23/2012   COLOSTOMY TAKEDOWN  07/23/2012   Procedure: COLOSTOMY TAKEDOWN;  Surgeon: Robyne Askew, MD;  Location: [facility] OR;  Service: General;  Laterality: N/A;  Primary Anastomosis   ELBOW FRACTURE SURGERY  ~ 1975   left;  pins inserted    EXCISION OF MESH N/A 10/04/2022   Procedure: REMOVAL OF MESH;  Surgeon: Griselda Miner, MD;  Location[facility] OR;  Service: General;  Laterality: N/A;   INCISION AND DRAINAGE ABSCESS N/A 04/05/2021   Procedure: INCISION AND DRAINAGE ABDOMINAL WALL ABSCESS;  Surgeon: Griselda Miner, MD;  Location: [facility] OR;  Service: General;  Laterality: N/A;   INSERTION OF MESH N/A 05/20/2013   Procedure: INSERTION OF MESH;  Surgeon: Robyne Askew, MD;  Location: [facility] OR;  Service: General;  Laterality: N/A;   IRRIGATION AND DEBRIDEMENT ABSCESS N/A 10/04/2022   Procedure: DRAINAGE ABDOMIAL WALL ABSCESS;  Surgeon: Griselda Miner, MD;  Location: [facility] OR;  Service: General;  Laterality: N/A;   LAPAROSCOPIC LEFT COLON RESECTION  02/19/2012   SIGMOID   LAPAROTOMY  02/27/2012   Procedure: EXPLORATORY LAPAROTOMY;  Surgeon: Robyne Askew, MD;  Location: [facility] OR;  Service: General;  Laterality: N/A;   LAPAROTOMY  07/23/2012   Procedure: EXPLORATORY LAPAROTOMY;  Surgeon: Robyne Askew, MD;  Location: [facility] OR;   Service: General;  Laterality: N/A;   LAPAROTOMY N/A 10/04/2022   Procedure: EXPLORATORY LAPAROTOMY;  Surgeon: Griselda Miner, MD;  Location: [facility] OR;  Service: General;  Laterality: N/A;   LAPAROTOMY N/A 10/13/2022   Procedure: EXPLORATORY LAPAROTOMY;  Surgeon: Rodman Pickle, MD;  Location: [facility] OR;  Service: General;  Laterality: N/A;   LESION DESTRUCTION  07/23/2012   Procedure: DESTRUCTION LESION ANUS;  Surgeon: Robyne Askew, MD;  Location: [facility] OR;  Service: General;  Laterality: N/A;  DESTRUCTION ANAL CONDYLOMA   LYSIS OF ADHESION  07/23/2012   Procedure: LYSIS OF ADHESION;  Surgeon: Robyne Askew, MD;  Location: [facility] OR;  Service: General;  Laterality: N/A;   LYSIS OF ADHESION N/A 10/04/2022   Procedure: LYSIS OF ADHESION;  Surgeon: Griselda Miner, MD;  Location: [facility] OR;  Service: General;  Laterality: N/A;   VENTRAL HERNIA REPAIR  05/20/2013   Dr Rogelio Seen HERNIA REPAIR N/A 05/20/2013   Procedure: VENTRAL HERNIA REPAIR ;  Surgeon: Robyne Askew, MD;  Location: [facility] OR;  Service: General;  Laterality: N/A;   WART FULGURATION  02/19/2012   Procedure: FULGURATION ANAL WART;  Surgeon: Robyne Askew, MD;  Location: [facility] OR;  Service: General;  Laterality: N/A;  Destroy  Anal Condyloma    WISDOM TOOTH EXTRACTION  ~ 1983   WOUND DEBRIDEMENT N/A 06/03/2016   Procedure: ABDOMINAL WOUND EXPLORATION AND STITCH REMOVAL;  Surgeon: Chevis Pretty III, MD;  Location: [facility];  Service: General;  Laterality: N/A;  ABDOMINAL WOUND EXPLORATION AND STITCH REMOVAL    SOCIAL HISTORY: Social History   Socioeconomic History   Marital status: Legally Separated    Spouse name: Not on file   Number of children: 3   Years of education: Not on file   Highest education level: Not on file  Occupational History   Occupation: unemployed  Tobacco Use   Smoking status: Every Day    Packs/day: 2.00    Years: 34.00    Additional pack years: 0.00    Total pack years: 68.00    Types:  Cigarettes    Start date: 07/22/1981   Smokeless tobacco: Former    Types: Chew    Quit date: 12/12/2010   Tobacco comments:    Current 2 PPD smoker - Engineer, maintenance (IT) [employer]  Vaping Use   Vaping Use: Never used  Substance and Sexual Activity   Alcohol use: Not Currently    Comment: 6 pack of beer daily   Drug use: Yes    Types: Oxycodone, Marijuana    Comment: Smokes marijuana once a week   Sexual activity: Not on file  Other Topics Concern   Not on file  Social History Narrative   Lives with wife [place], Children Daman (age 56) Loel Lofty (age 30) Redmond Baseman (age 55).  Unemployed less than high school education.  Updated 10/29/11   Social Determinants of Health   Financial Resource Strain: Medium Risk (07/17/2022)   Overall Financial Resource Strain (CARDIA)    Difficulty of Paying Living Expenses: Somewhat hard  Food Insecurity: No Food Insecurity (10/22/2022)   Hunger Vital Sign    Worried About Running Out of Food in the Last Year: Never true    Ran Out of Food in the Last Year: Never true  Recent Concern: Food Insecurity - Food Insecurity Present (07/31/2022)   Hunger Vital Sign    Worried About Running Out of Food in the Last Year: Sometimes true    Ran Out of Food in the Last Year: Sometimes true  Transportation Needs: No Transportation Needs (10/22/2022)   PRAPARE - Administrator, Civil Service (Medical): No    Lack of Transportation (Non-Medical): No  Physical Activity: Inactive (07/17/2022)   Exercise Vital Sign    Days of Exercise per Week: 0 days    Minutes of Exercise per Session: 0 min  Stress: Stress Concern Present (07/17/2022)   [employer] of Occupational Health - Occupational Stress Questionnaire    Feeling of Stress : To some extent  Social Connections: Socially Isolated (07/17/2022)   Social Connection and Isolation Panel [NHANES]    Frequency of Communication with Friends and Family: More than three times a week    Frequency of Social Gatherings with  Friends and Family: Three times a week  Attends Religious Services: Never    Active Member of Clubs or Organizations: No    Attends Banker Meetings: Never    Marital Status: Divorced  Catering manager Violence: Not At Risk (10/22/2022)   Humiliation, Afraid, Rape, and Kick questionnaire    Fear of Current or Ex-Partner: No    Emotionally Abused: No    Physically Abused: No    Sexually Abused: No    FAMILY HISTORY: Family History  Problem Relation Age of Onset   Diabetes Mother    Parkinsonism Mother    Depression Mother    Alcohol abuse Father    Hypertension Father    Anxiety disorder Father    Ulcers Father    Colon polyps Father    Breast cancer Maternal Aunt    Diabetes Maternal Grandmother    Stroke Neg Hx    Heart disease Neg Hx    Colon cancer Neg Hx    Stomach cancer Neg Hx     ALLERGIES:  is allergic to [place] [zolpidem tartrate], bactroban [mupirocin calcium], zestril [lisinopril], darvon [propoxyphene], and morphine and related.  MEDICATIONS:  Current Outpatient Medications  Medication Sig Dispense Refill   albuterol (VENTOLIN HFA) 108 (90 Base) MCG/ACT inhaler Inhale 2 puffs into the lungs every 6 (six) hours as needed for wheezing or shortness of breath.     amoxicillin-clavulanate (AUGMENTIN) 875-125 MG tablet Take 1 tablet by mouth 2 (two) times daily for 28 days. 56 tablet 0   busPIRone (BUSPAR) 15 MG tablet TAKE 1 TABLET BY MOUTH IN THE MORNING. (Patient taking differently: Take 15 mg by mouth daily.) 60 tablet 0   doxycycline (VIBRA-TABS) 100 MG tablet Take 1 tablet (100 mg total) by mouth 2 (two) times daily for 28 days. 56 tablet 0   gabapentin (NEURONTIN) 100 MG capsule TAKE 1 CAPSULE BY MOUTH FOUR TIMES DAILY AS NEEDED (Patient taking differently: Take 100 mg by mouth 4 (four) times daily as needed (Neuropathic pain).) 90 capsule 3   losartan (COZAAR) 100 MG tablet TAKE 1 TABLET (100 MG TOTAL) BY MOUTH DAILY. 30 tablet 0   methocarbamol  (ROBAXIN) 500 MG tablet Take 1 tablet (500 mg total) by mouth every 8 (eight) hours as needed for muscle spasms. 20 tablet 2   ondansetron (ZOFRAN-ODT) 4 MG disintegrating tablet Take 1 tablet (4 mg total) by mouth every 8 (eight) hours as needed for nausea or vomiting. 20 tablet 0   oxyCODONE (OXY IR/ROXICODONE) 5 MG immediate release tablet Take 1 tablet (5 mg total) by mouth every 6 (six) hours as needed for moderate pain. 30 tablet 0   potassium chloride (KLOR-CON M) 10 MEQ tablet Take 4 tablets (40 mEq total) by mouth daily for 5 days. 20 tablet 0   sertraline (ZOLOFT) 100 MG tablet TAKE 1 TABLET (100 MG TOTAL) BY MOUTH DAILY. 30 tablet 3   umeclidinium-vilanterol (ANORO ELLIPTA) 62.5-25 MCG/ACT AEPB Inhale 1 puff into the lungs daily. 1 each 6   No current facility-administered medications for this visit.    REVIEW OF SYSTEMS:    10 Point review of Systems was done is negative except as noted above.  PHYSICAL EXAMINATION: ECOG PERFORMANCE STATUS: 2 - Symptomatic, <50% confined to bed  . Vitals:   11/07/22 1243  BP: 139/86  Pulse: 99  Resp: 17  Temp: 98.1 F (36.7 C)  SpO2: 98%   Filed Weights   11/07/22 1243  Weight: 147 lb 8 oz (66.9 kg)   .Body mass index is 21.78  kg/m.  GENERAL:alert, in no acute distress and comfortable SKIN: no acute rashes, no significant lesions EYES: conjunctiva are pink and non-injected, sclera anicteric OROPHARYNX: MMM, no exudates, no oropharyngeal erythema or ulceration NECK: supple, no JVD LYMPH:  no palpable lymphadenopathy in the cervical, axillary or inguinal regions LUNGS: clear to auscultation b/l with normal respiratory effort HEART: regular rate & rhythm ABDOMEN:  normoactive bowel sounds , non tender, not distended. Extremity: no pedal edema PSYCH: alert & oriented x 3 with fluent speech NEURO: no focal motor/sensory deficits  LABORATORY DATA:  I have reviewed the data as listed  .    Latest Ref Rng & Units 11/07/2022     1:05 PM 10/28/2022    5:16 PM 10/24/2022    1:03 AM  CBC  WBC 4.0 - 10.5 K/uL 8.5  11.1  6.9   Hemoglobin 13.0 - 17.0 g/dL 40.9  9.1  7.6   Hematocrit 39.0 - 52.0 % 35.0  28.9  24.1   Platelets 150 - 400 K/uL 658  929  707     .    Latest Ref Rng & Units 11/07/2022    1:05 PM 10/22/2022   10:55 AM 10/22/2022   10:48 AM  CMP  Glucose 70 - 99 mg/dL 92  811  914   BUN 6 - 20 mg/dL 13  12  10    Creatinine 0.61 - 1.24 mg/dL 7.82  9.56  2.13   Sodium 135 - 145 mmol/L 139  140  138   Potassium 3.5 - 5.1 mmol/L 4.0  3.7  3.7   Chloride 98 - 111 mmol/L 107  104  103   CO2 22 - 32 mmol/L 22   22   Calcium 8.9 - 10.3 mg/dL 9.4   8.1   Total Protein 6.5 - 8.1 g/dL 7.5   7.1   Total Bilirubin 0.3 - 1.2 mg/dL 0.2   0.5   Alkaline Phos 38 - 126 U/L 114   134   AST 15 - 41 U/L 14   26   ALT 0 - 44 U/L 12   23      RADIOGRAPHIC STUDIES: I have personally reviewed the radiological images as listed and agreed with the findings in the report. CT CHEST WO CONTRAST  Result Date: 10/22/2022 CLINICAL DATA:  Pulmonary nodules. EXAM: CT CHEST WITHOUT CONTRAST TECHNIQUE: Multidetector CT imaging of the chest was performed following the standard protocol without IV contrast. RADIATION DOSE REDUCTION: This exam was performed according to the departmental dose-optimization program which includes automated exposure control, adjustment of the mA and/or kV according to patient size and/or use of iterative reconstruction technique. COMPARISON:  Earlier abdominal CT scan, same date and prior chest CT from 07/11/2022 FINDINGS: Cardiovascular: The heart is normal in size. No pericardial effusion. The aorta is normal in caliber. Stable scattered atherosclerotic calcifications. Stable age advanced three-vessel coronary artery calcifications. Mediastinum/Nodes: Scattered mediastinal and hilar lymph nodes are stable. The esophagus is grossly. Lungs/Pleura: Stable underlying emphysematous changes and pulmonary scarring. No focal  infiltrates or pleural effusions. As demonstrated on the earlier abdominal CT scan there are irregular nodules at the left lung base. These were not present on the prior CT scan from 2023 and are most likely inflammatory or infectious. Recommend short-term follow-up noncontrast chest CT in 3 months to reassess. Upper Abdomen: Pneumoperitoneum is seen on earlier abdominal CT scan. Musculoskeletal: No significant bony findings. IMPRESSION: 1. Stable emphysematous changes and pulmonary scarring. 2. As demonstrated on the earlier abdominal  CT scan there are irregular nodules at the left lung base. These were not present on the prior CT scan from 2023 and are most likely inflammatory or infectious. Recommend short-term follow-up noncontrast chest CT in 3 months to reassess. 3. Stable age advanced three-vessel coronary artery calcifications. 4. Pneumoperitoneum is seen on earlier abdominal CT scan. Emphysema (ICD10-J43.9). Electronically Signed   By: Rudie Meyer M.D.   On: 10/22/2022 18:59   CT Abdomen Pelvis W Contrast  Result Date: 10/22/2022 CLINICAL DATA:  Follow up pneumoperitoneum EXAM: CT ABDOMEN AND PELVIS WITH CONTRAST TECHNIQUE: Multidetector CT imaging of the abdomen and pelvis was performed using the standard protocol following bolus administration of intravenous contrast. RADIATION DOSE REDUCTION: This exam was performed according to the departmental dose-optimization program which includes automated exposure control, adjustment of the mA and/or kV according to patient size and/or use of iterative reconstruction technique. CONTRAST:  75mL OMNIPAQUE IOHEXOL 350 MG/ML SOLN COMPARISON:  10/13/2022 FINDINGS: Lower chest: Bibasilar lung nodules largest measuring 1.2 cm on the right. No pericardial or pleural effusion. Hepatobiliary: No focal liver abnormality is seen. No gallstones, gallbladder wall thickening, or biliary dilatation. Pancreas: Unremarkable. No pancreatic ductal dilatation or surrounding  inflammatory changes. Spleen: Normal in size without focal abnormality. Adrenals/Urinary Tract: Adrenal glands are unremarkable. Kidneys are normal, without renal calculi, focal lesion, or hydronephrosis. Bladder is unremarkable. Stomach/Bowel: Mild dilatation of small bowel loops likely an ileus. Left lower quadrant colostomy with evidence of partial left hemicolectomy. Appendix not identified. Vascular/Lymphatic: Aortic atherosclerosis. No enlarged abdominal or pelvic lymph nodes. Reproductive: Prostate is unremarkable. Other: Significant decrease in pneumoperitoneum compared to the prior study. Left lower abdominal collection of fluid with [facility] of air measures 8 cm, significantly smaller than on the prior study. Musculoskeletal: Bilateral L5 spondylolysis. IMPRESSION: 1. Left abdominal fluid collection consistent with abscess has diminished in size compared to the prior study. 2. Mild small bowel dilatation likely representing ileus. 3. Significant decrease in pneumoperitoneum. Electronically Signed   By: Layla Maw M.D.   On: 10/22/2022 14:49   CT Head Wo Contrast  Result Date: 10/22/2022 CLINICAL DATA:  Head trauma EXAM: CT HEAD WITHOUT CONTRAST TECHNIQUE: Contiguous axial images were obtained from the base of the skull through the vertex without intravenous contrast. RADIATION DOSE REDUCTION: This exam was performed according to the departmental dose-optimization program which includes automated exposure control, adjustment of the mA and/or kV according to patient size and/or use of iterative reconstruction technique. COMPARISON:  CT Head 08/21/12 FINDINGS: Brain: No evidence of acute infarction, hemorrhage, hydrocephalus, extra-axial collection or mass lesion/mass effect. Vascular: No hyperdense vessel or unexpected calcification. Skull: Normal. Negative for fracture or focal lesion. Sinuses/Orbits: Small left mastoid effusion. No middle ear effusion. Mild mucosal thickening right sphenoid sinus and  right maxillary sinus orbits are unremarkable Other: None. IMPRESSION: No CT evidence of intracranial injury. Electronically Signed   By: Lorenza Cambridge M.D.   On: 10/22/2022 14:45   DG Chest 2 View  Result Date: 10/22/2022 CLINICAL DATA:  Possible sepsis, dizziness EXAM: CHEST - 2 VIEW COMPARISON:  10/14/2022 FINDINGS: Cardiac size is within normal limits. There is interval removal of endotracheal tube and NG tube. Lung fields are clear of any infiltrates or pulmonary edema. There is no pleural effusion or pneumothorax. There is significant interval decrease in amount of pneumoperitoneum under the right hemidiaphragm. IMPRESSION: There are no signs of pulmonary edema or focal pulmonary consolidation. There is significant interval decrease in amount of pneumoperitoneum under the right hemidiaphragm in comparison  with the study of 10/14/2022. Electronically Signed   By: Ernie Avena M.D.   On: 10/22/2022 11:32   [place] EKG SITE RITE  Result Date: 10/14/2022 If Site Rite image not attached, placement could not be confirmed due to current cardiac rhythm.  DG Abd Portable 1V  Result Date: 10/14/2022 CLINICAL DATA:  Orogastric tube placement. EXAM: PORTABLE ABDOMEN - 1 VIEW COMPARISON:  Same day. FINDINGS: Distal tip of nasogastric tube is seen in expected position of distal esophagus; it appears to be somewhat retracted compared to prior exam. IMPRESSION: Distal tip of nasogastric tube seen in expected position of distal esophagus and appears to have been somewhat retracted compared to prior exam. Electronically Signed   By: Lupita Raider M.D.   On: 10/14/2022 09:37   DG Abd Portable 1V  Result Date: 10/14/2022 CLINICAL DATA:  Evaluate NG tube placement. EXAM: PORTABLE ABDOMEN - 1 VIEW COMPARISON:  10/14/2022 FINDINGS: Enteric tube is tip and side port are below the GE junction within the gastric fundus. Pneumoperitoneum is again noted along the undersurface of the right hemidiaphragm unchanged  gaseous distension of the transverse colon. IMPRESSION: 1. Satisfactory position of enteric tube. 2. Unchanged gaseous distension of the transverse colon. 3. Persistent pneumoperitoneum which most likely reflects changes from recent laparotomy. Electronically Signed   By: Signa Kell M.D.   On: 10/14/2022 08:08   DG Abd Portable 1V  Result Date: 10/14/2022 CLINICAL DATA:  OG tube placement. EXAM: PORTABLE ABDOMEN - 1 VIEW COMPARISON:  Earlier same day FINDINGS: OG tube tip is in the distal esophagus. Tube could be advanced 16-20 cm to place the proximal side port below the GE junction, as clinically warranted. Gaseous distention of transverse colon noted upper abdomen. IMPRESSION: OG tube tip is in the distal esophagus.  See above. Electronically Signed   By: Kennith Center M.D.   On: 10/14/2022 08:02   CT ABDOMEN PELVIS W CONTRAST  Result Date: 10/14/2022 CLINICAL DATA:  Vomiting EXAM: CT ABDOMEN AND PELVIS WITH CONTRAST TECHNIQUE: Multidetector CT imaging of the abdomen and pelvis was performed using the standard protocol following bolus administration of intravenous contrast. RADIATION DOSE REDUCTION: This exam was performed according to the departmental dose-optimization program which includes automated exposure control, adjustment of the mA and/or kV according to patient size and/or use of iterative reconstruction technique. CONTRAST:  75mL ISOVUE-370 IOPAMIDOL (ISOVUE-370) INJECTION 76% COMPARISON:  08/14/2022 FINDINGS: Lower chest: Patchy atelectatic changes are noted in the bases bilaterally. Hepatobiliary: No focal liver abnormality is seen. No gallstones, gallbladder wall thickening, or biliary dilatation. Pancreas: Unremarkable. No pancreatic ductal dilatation or surrounding inflammatory changes. Spleen: Normal in size without focal abnormality. Adrenals/Urinary Tract: Adrenal glands are within normal limits. Kidneys demonstrate a normal enhancement pattern bilaterally. No renal calculi or  obstructive changes are seen. The bladder is decompressed. Stomach/Bowel: Postsurgical changes are noted is at the junction of the descending and sigmoid colons with a defect at the anastomotic site and adjacent free air. There is extensive intraperitoneal air identified consistent with this disruption. The more proximal colon appears within normal limits. The appendix is not visualized consistent with a prior surgical history. Stomach is well distended with fluid. Some mildly dilated loops of small bowel are not identified likely reactive to a large intra-abdominal air-fluid collection in the left mid abdomen. This measures approximately 12.6 x 8.5 cm consistent with postoperative abscess. This is best seen on image number 61 of series 3. Adjacent to this is a smaller air-fluid collection measuring 7.4  x 3.6 cm. These 2 air-fluid collections may inter connect although it is difficult to demonstrate on this exam. Vascular/Lymphatic: Aortic atherosclerosis. No enlarged abdominal or pelvic lymph nodes. Reproductive: Prostate is unremarkable. Other: No focal hernia is noted. Previously seen subcutaneous abscess has resolved following prior surgery. Open anterior wound is noted with packing material within. Musculoskeletal: No acute or significant osseous findings. IMPRESSION: Considerable amount of free intraperitoneal air related to breakdown of colonic anastomosis in the left lower quadrant best seen on image number 69 of series 3. Additionally there are 2 large air-fluid collections consistent with postoperative abscess. Associated small bowel dilatation is noted likely of a reactive nature. These findings were not communicated to the referring clinician at this time as the patient has already return to the operating room prior to the interpretation of this exam. Electronically Signed   By: Alcide Clever M.D.   On: 10/14/2022 02:23   DG Abd Portable 1V  Result Date: 10/14/2022 CLINICAL DATA:  Check gastric  catheter placement EXAM: PORTABLE ABDOMEN - 1 VIEW COMPARISON:  None Available. FINDINGS: Gastric catheter is noted in the distal esophagus. This should be advanced several cm deeper into the stomach. IMPRESSION: Gastric catheter in the distal esophagus. This should be advanced deeper into the stomach. Electronically Signed   By: Alcide Clever M.D.   On: 10/14/2022 02:06   DG CHEST PORT 1 VIEW  Result Date: 10/14/2022 CLINICAL DATA:  Check endotracheal tube and gastric catheter placement, initial encounter EXAM: PORTABLE CHEST 1 VIEW COMPARISON:  10/11/2022 FINDINGS: Cardiac shadow is stable. Endotracheal tube is noted 5 cm above the carina. Gastric catheter is seen in the distal esophagus well short of the stomach. This should be advanced several cm. The lungs are clear. Free air is noted beneath the right hemidiaphragm stable from the prior exam. IMPRESSION: Tubes and lines as described. Gastric catheter should be advanced deeper into the stomach. Free intraperitoneal air beneath the right hemidiaphragm similar to that seen on the prior exam. Electronically Signed   By: Alcide Clever M.D.   On: 10/14/2022 02:06   DG Chest Port 1 View  Result Date: 10/11/2022 CLINICAL DATA:  Productive cough EXAM: PORTABLE CHEST 1 VIEW COMPARISON:  Previous chest radiographs done on 07/24/2021, CT chest done on 07/11/2022 FINDINGS: Cardiac size is within normal limits. There are no signs of pulmonary edema. Linear densities are seen in medial left lower lung field. There is no pleural effusion or pneumothorax. There is a density under the right hemidiaphragm. This finding was not evident in the previous chest radiographs. IMPRESSION: Linear densities in medial left lower lung fields suggest atelectasis/pneumonia. Right hemidiaphragm is elevated along with air attenuation under the right hemidiaphragm, most likely due to interposition of gas-filled bowel loops between liver and right hemidiaphragm. If there is clinical  suspicion for abdominal pathology such as pneumoperitoneum, follow-up supine and upright abdominal radiographs along with CT if warranted may be considered. Electronically Signed   By: Ernie Avena M.D.   On: 10/11/2022 12:13    ASSESSMENT & PLAN:   Wonderful 57 y.o. male with:  Thrombocytosis-likely reactive and related to recent intra-abdominal abscess related to anastomotic leak + inflammation from surgery plus smoking per day plus possible inflammation related to lung nodule of undetermined significance.  Presence of iron deficiency anemia in the context of surgical blood loss and poor absorption from bowel surgeries. Lung nodule seen on imaging study  History of recurrent intra-abdominal abscesses Smoker more than 1 pack/day History of previous  heavy alcohol use  PLAN:  -Patient's platelet levels were normal in March 2024 -Discussed lab results from 10/28/2022 with patient. CBC showed WBC of 11.1K, hemoglobin of 9.1, and platelets of 929K. -patient is anemic -Patient likely does not endorse a primary bone marrow problem as thrombocytosis may be a reactive process to factors such as recent sepsis infection, smoking, nutritional deficiencies, ongoing abdominal abscess -I suspect that patient's thrombocytosis is a reaction to significant abdominal inflammation and not a bone marrow problem -Discussed 10/22/2022 CT scan which revealed normal spleen size -advised patient to follow-up with PCP to consider inhaler use as patient did endorse wheezing on examination -recommended patient to keep skin surrounding the rash on his right LE dry -recommended patient to stay UTD with age-appropriate cancer screenings -recommended patient to follow up with if pulmonologist to follow up on lung nodule of left lung -Will order blood tests to rule out possibility of clonal thrombocytosis such as essential thrombocytosis. -Counseled patient on need for him to follow-up with his primary care physician for  repeat CT chest in 3 months to reevaluate his left lower lobe lung nodule. -Extensively counseled patient on need for smoking cessation.  FOLLOW-UP: Labs today Phone visit with Dr Candise Che in 2 weeks  The total time spent in the appointment was 63 minutes* .  All of the patient's questions were answered with apparent satisfaction. The patient knows to call the clinic with any problems, questions or concerns.   Wyvonnia Lora MD MS AAHIVMS [facility] Hematology/Oncology Physician [facility]  .*Total Encounter Time as defined by the Centers for Medicare and Medicaid Services includes, in addition to the face-to-face time of a patient visit (documented in the note above) non-face-to-face time: obtaining and reviewing outside history, ordering and reviewing medications, tests or procedures, care coordination (communications with other health care professionals or caregivers) and documentation in the medical record.   I,Mitra Faeizi,acting as a Neurosurgeon for Wyvonnia Lora, MD.,have documented all relevant documentation on the behalf of Wyvonnia Lora, MD,as directed by  Wyvonnia Lora, MD while in the presence of Wyvonnia Lora, MD.  .I have reviewed the above documentation for accuracy and completeness, and I agree with the above. Johney Maine MD

## 2022-11-08 ENCOUNTER — Ambulatory Visit (HOSPITAL_COMMUNITY)
Admission: RE | Admit: 2022-11-08 | Discharge: 2022-11-08 | Disposition: A | Discharge status: Home / Self Care | Payer: Medicare Other | Source: Ambulatory Visit | Attending: Surgery | Admitting: Surgery

## 2022-11-08 DIAGNOSIS — K9400 Colostomy complication, unspecified: Secondary | ICD-10-CM

## 2022-11-08 DIAGNOSIS — Z433 Encounter for attention to colostomy: Secondary | ICD-10-CM | POA: Insufficient documentation

## 2022-11-08 DIAGNOSIS — L24B3 Irritant contact dermatitis related to fecal or urinary stoma or fistula: Secondary | ICD-10-CM

## 2022-11-08 NOTE — Progress Notes (Signed)
[facility]   Reason for visit:  LLQ colostomy with mucocutaneous separation, improving.  HPI:  End colostomy Past Medical History:  Diagnosis Date   Acute bronchitis 02/15/2013   Alcohol abuse    Anxiety    Bipolar disorder (HCC)    Bronchitis    history   Bronchitis    Cellulitis    - left knee-2009   Chronic pain    Complication of anesthesia    Condyloma 02/04/2012   Depression    Diverticulosis    By colonoscopy June 2005   Dyspnea    on exertion at times   Elevated CK 09/2011   Emphysema lung (HCC)    Emphysema of lung (HCC)    Family history of anesthesia complication    Mother N/V   GERD (gastroesophageal reflux disease)    Glaucoma syndrome    does not use eye drops, "They burn".   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 Presumably secondary to diverticulosis.   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   Ventral hernia 01/11/2013   Repaired with mesh 05/20/13   Wrist pain 01/26/2019   Family History  Problem Relation Age of Onset   Diabetes Mother    Parkinsonism Mother    Depression Mother    Alcohol abuse Father    Hypertension Father    Anxiety disorder Father    Ulcers Father    Colon polyps Father    Breast cancer Maternal Aunt    Diabetes Maternal Grandmother    Stroke Neg Hx    Heart disease Neg Hx     Colon cancer Neg Hx    Stomach cancer Neg Hx    Allergies  Allergen Reactions   Ambien [Zolpidem Tartrate] Other (See Comments)    Agitation   Bactroban [Mupirocin Calcium] Swelling    Caused infection and swelling at a surgical site   Zestril [Lisinopril] Cough   Darvon [Propoxyphene] Itching   Morphine And Related Itching   Current Outpatient Medications  Medication Sig Dispense Refill Last Dose   albuterol (VENTOLIN HFA) 108 (90 Base) MCG/ACT inhaler Inhale 2 puffs into the lungs every 6 (six) hours as needed for wheezing or shortness of breath.      amoxicillin-clavulanate (AUGMENTIN) 875-125 MG tablet Take 1 tablet by mouth 2 (two) times daily for 28 days. 56 tablet 0    busPIRone (BUSPAR) 15 MG tablet TAKE 1 TABLET BY MOUTH IN THE MORNING. (Patient taking differently: Take 15 mg by mouth daily.) 60 tablet 0    doxycycline (VIBRA-TABS) 100 MG tablet Take 1 tablet (100 mg total) by mouth 2 (two) times daily for 28 days. 56 tablet 0    gabapentin (NEURONTIN) 100 MG capsule TAKE 1 CAPSULE BY MOUTH FOUR TIMES DAILY AS NEEDED (Patient taking differently: Take 100  mg by mouth 4 (four) times daily as needed (Neuropathic pain).) 90 capsule 3    losartan (COZAAR) 100 MG tablet TAKE 1 TABLET (100 MG TOTAL) BY MOUTH DAILY. 30 tablet 0    methocarbamol (ROBAXIN) 500 MG tablet Take 1 tablet (500 mg total) by mouth every 8 (eight) hours as needed for muscle spasms. 20 tablet 2    ondansetron (ZOFRAN-ODT) 4 MG disintegrating tablet DISSOLVE 1 TABLET ON THE TONGUE EVERY 8 HOURS AS NEEDED FOR NAUSEA OR VOMITING 20 tablet 0    oxyCODONE (OXY IR/ROXICODONE) 5 MG immediate release tablet Take 1 tablet (5 mg total) by mouth every 6 (six) hours as needed for moderate pain. 30 tablet 0    potassium chloride (KLOR-CON M) 10 MEQ tablet Take 4 tablets (40 mEq total) by mouth daily for 5 days. 20 tablet 0    sertraline (ZOLOFT) 100 MG tablet TAKE 1 TABLET (100 MG TOTAL) BY MOUTH DAILY. 30 tablet 3     umeclidinium-vilanterol (ANORO ELLIPTA) 62.5-25 MCG/ACT AEPB Inhale 1 puff into the lungs daily. 1 each 6    No current facility-administered medications for this encounter.   ROS  Review of Systems  Gastrointestinal:        LLQ colostomy  Skin:  Positive for wound.       Peristomal breakdown   All other systems reviewed and are negative.  Vital signs:  BP (!) 134/95 (BP Location: Left Arm)   Pulse (!) 107   Temp 97.9 F (36.6 C) (Oral)   Resp 18   SpO2 98%  Exam:  Physical Exam Vitals reviewed.  Constitutional:      Appearance: Normal appearance.  Abdominal:     Palpations: Abdomen is soft.  Skin:    General: Skin is warm and dry.     Comments: Mucocutaneous separation, aquacel to defect  Neurological:     Mental Status: He is alert.     Stoma type/location:  LLQ colostomy Stomal assessment/size:  2" round, slightly budded Peristomal assessment:  separation, improving Treatment options for stomal/peristomal skin: barrier ring and 2 piece 2 3/4" pouch  aquacel to separation to absorb moisture and promote healing Output: soft brown stool Ostomy pouching: 2pc. 2 3/4" pouch with aquacel and barrier ring Education provided:  performed pouch change.     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   Maple Hudson FNP-BC

## 2022-11-13 LAB — JAK2 (INCLUDING V617F AND EXON 12), MPL,& CALR-NEXT GEN SEQ

## 2022-11-14 ENCOUNTER — Other Ambulatory Visit: Payer: Self-pay | Admitting: Student

## 2022-11-14 DIAGNOSIS — R110 Nausea: Secondary | ICD-10-CM

## 2022-11-15 DIAGNOSIS — L24B3 Irritant contact dermatitis related to fecal or urinary stoma or fistula: Secondary | ICD-10-CM | POA: Insufficient documentation

## 2022-11-15 DIAGNOSIS — K9400 Colostomy complication, unspecified: Secondary | ICD-10-CM | POA: Insufficient documentation

## 2022-11-15 NOTE — Discharge Instructions (Signed)
Will set up with [facility] 

## 2022-11-18 ENCOUNTER — Inpatient Hospital Stay (HOSPITAL_BASED_OUTPATIENT_CLINIC_OR_DEPARTMENT_OTHER): Payer: Medicare Other | Admitting: Hematology

## 2022-11-18 DIAGNOSIS — D75839 Thrombocytosis, unspecified: Secondary | ICD-10-CM | POA: Diagnosis not present

## 2022-11-18 DIAGNOSIS — D509 Iron deficiency anemia, unspecified: Secondary | ICD-10-CM | POA: Diagnosis not present

## 2022-11-18 DIAGNOSIS — E538 Deficiency of other specified B group vitamins: Secondary | ICD-10-CM

## 2022-11-18 MED ORDER — B-12 1000 MCG SL SUBL: 1000.0000 ug | SUBLINGUAL_TABLET | Freq: Every day | SUBLINGUAL | 5 refills | Status: DC

## 2022-11-18 MED ORDER — POLYSACCHARIDE IRON COMPLEX 150 MG PO CAPS: 150.0000 mg | ORAL_CAPSULE | Freq: Every day | ORAL | 5 refills | Status: DC

## 2022-11-18 NOTE — Progress Notes (Signed)
HEMATOLOGY/ONCOLOGY PHONE VISIT NOTE  Date of Service: 11/18/2022  Patient Care Team: Timothy Erp, MD as PCP - General (Family Medicine) Timothy Crafts, MD as PCP - Cardiology (Cardiology)  CHIEF COMPLAINTS/PURPOSE OF CONSULTATION:  Evaluation and management of thrombocytosis  HISTORY OF PRESENTING ILLNESS:   Timothy [name] is a wonderful 57 y.o. male who has been referred to [place] by Timothy Erp, MD for evaluation and management of thrombocytosis.  Patient was admitted recently from 10/04/2018 for through 10/19/2018 for 4 drainage of an intra-abdominal abscess due to anastomotic leak requiring exploratory laparotomy. Patient's platelet counts were normal as of  10/10/2022 and started increasing after his exploratory laparotomy with resection of colon anastomosis and end colostomy done on 10/09/2022 and went up to as high as just above a million (1055k). Platelet counts today have come back down to 658k without any specific platelet directed interventions probably as a function of resolving abscess and surgery related reactionary increase in platelets.  Today, he is accompanied by his daughter-in-law. He reports that he has had 13 total surgeries to treat his abdominal abscess. He continues to take oral antibiotics, and will do so for a total of 28 days.  Patient does complain of having diarrhea this morning. Patient denies any testicular pain/swelling  or leg edema. Patient does report a skin rash on the back of his right thigh.  He denies any previous liver issues. He reports that he previously consumed "heavy amounts" of alcohol prior to his surgery. However, he no longer consumes alcohol due to finding it to have a poor taste.   Patient continues to smoke slightly over one pack a day. He did previously smoke 2 packs a day prior to surgery.   In regards to patient's weight, he reports weighing 220 pounds a decade ago. His weight was 170 pounds at the time of his surgery, and  his current weight is 147 pounds. Patient reports that his appetite has improved.   Patient reports that he does sometimes wake up with night sweats, which he reports is normal. He also endorses fluctuating chills.   He denies any other infections, nose bleeds, blood in urine, gum bleeds or bone pain. He denies any reason for spleen injury. He continues to use Albuterol.   INTERVAL HISTORY:  Timothy [name] is a wonderful 57 y.o. male who is contacted via phone for continued evaluation and management of thrombocytosis. He was last seen by me on 11/07/2022.   .I connected with Timothy [name] on 11/18/2022 at  3:30 PM EDT by telephone visit and verified that I am speaking with the correct person using two identifiers.   Patient reports he has been doing fairly well since our last visit. He complains of mild nausea, dizziness, and fatigue. He denies fever, chills, night sweats, infection issues, back pain, chest pain, or leg swelling.   He denies taking iron supplement and B-12 supplement.    I discussed the limitations, risks, security and privacy concerns of performing an evaluation and management service by telemedicine and the availability of in-person appointments. I also discussed with the patient that there may be a patient responsible charge related to this service. The patient expressed understanding and agreed to proceed.   Other persons participating in the visit and their role in the encounter: None   Patient's location: Home  Provider's location: [facility]   Chief Complaint: thrombocytosis     MEDICAL HISTORY:  Past Medical History:  Diagnosis Date   Acute bronchitis 02/15/2013  Alcohol abuse    Anxiety    Bipolar disorder (HCC)    Bronchitis    history   Bronchitis    Cellulitis    - left knee-2009   Chronic pain    Complication of anesthesia    Condyloma 02/04/2012   Depression    Diverticulosis    By colonoscopy June 2005   Dyspnea    on exertion at times    Elevated CK 09/2011   Emphysema lung (HCC)    Emphysema of lung (HCC)    Family history of anesthesia complication    Mother N/V   GERD (gastroesophageal reflux disease)    Glaucoma syndrome    does not use eye drops, "They burn".   Headache(784.0)    "alot; not daily" (07/23/2012)   Heavy smoker    Hemorrhoids    History of colostomy 03/21/2012   Left colectomy and anastomosis performed by Timothy Pretty, MD on 02/18/13 for recurrent diverticulitis Exploratory laparotomy preformed 02/27/12 for anastomotic leak and Hartman pouch/colostomy peformed Colostomy taken down January 2014   History of diverticulitis of colon 10/16/2011   History of dizziness    History of stomach ulcers 2005   HPV (human papilloma virus) infection    Hypertension    started 3 months ago   Hypoglycemia    Incisional hernia    Lower GI bleed    June 2005. Presumably secondary to diverticulosis.   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   Ventral hernia 01/11/2013   Repaired with mesh 05/20/13   Wrist pain 01/26/2019    SURGICAL HISTORY: Past Surgical History:  Procedure Laterality Date   ABDOMINAL EXPLORATION SURGERY  07/23/2012   w/LOA (07/23/2012)   APPENDECTOMY  1982   COLON RESECTION  02/27/2012   Procedure: COLON RESECTION;  Surgeon: Timothy Askew, MD;  Location: [facility] OR;  Service: General;  Laterality: N/A;   COLON RESECTION N/A 10/04/2022   Procedure: SEGMENTAL COLON RESECTION;  Surgeon: Timothy Miner, MD;  Location: [facility] OR;  Service: General;  Laterality: N/A;   COLONOSCOPY  10/13/2022   Procedure: RESECTION OF COLON ANASTOMOSIS AND END COLOSCOPY;  Surgeon: Timothy Pickle, MD;  Location: [facility] OR;  Service: General;;   COLOSTOMY  02/27/2012   Procedure: COLOSTOMY;  Surgeon: Timothy Askew, MD;  Location: [facility] OR;  Service: General;  Laterality: N/A;   COLOSTOMY TAKEDOWN  07/23/2012   COLOSTOMY TAKEDOWN  07/23/2012   Procedure:  COLOSTOMY TAKEDOWN;  Surgeon: Timothy Askew, MD;  Location: [facility] OR;  Service: General;  Laterality: N/A;  Primary Anastomosis   ELBOW FRACTURE SURGERY  ~ 1975   left;  pins inserted    EXCISION OF MESH N/A 10/04/2022   Procedure: REMOVAL OF MESH;  Surgeon: Timothy Miner, MD;  Location: [facility] OR;  Service: General;  Laterality: N/A;   INCISION AND DRAINAGE ABSCESS N/A 04/05/2021   Procedure: INCISION AND DRAINAGE ABDOMINAL WALL ABSCESS;  Surgeon: Timothy Miner, MD;  Location: [facility] OR;  Service: General;  Laterality: N/A;   INSERTION OF MESH N/A 05/20/2013   Procedure: INSERTION OF MESH;  Surgeon: Timothy Askew, MD;  Location: [facility] OR;  Service: General;  Laterality: N/A;   IRRIGATION AND DEBRIDEMENT ABSCESS N/A 10/04/2022   Procedure: DRAINAGE ABDOMIAL WALL ABSCESS;  Surgeon: Timothy Miner, MD;  Location: [facility] OR;  Service: General;  Laterality: N/A;   LAPAROSCOPIC LEFT COLON RESECTION  02/19/2012   SIGMOID   LAPAROTOMY  02/27/2012   Procedure: EXPLORATORY LAPAROTOMY;  Surgeon: Timothy Askew, MD;  Location: [facility] OR;  Service: General;  Laterality: N/A;   LAPAROTOMY  07/23/2012   Procedure: EXPLORATORY LAPAROTOMY;  Surgeon: Timothy Askew, MD;  Location: [facility] OR;  Service: General;  Laterality: N/A;   LAPAROTOMY N/A 10/04/2022   Procedure: EXPLORATORY LAPAROTOMY;  Surgeon: Timothy Miner, MD;  Location: [facility] OR;  Service: General;  Laterality: N/A;   LAPAROTOMY N/A 10/13/2022   Procedure: EXPLORATORY LAPAROTOMY;  Surgeon: Sheliah Hatch De Blanch, MD;  Location: [facility] OR;  Service: General;  Laterality: N/A;   LESION DESTRUCTION  07/23/2012   Procedure: DESTRUCTION LESION ANUS;  Surgeon: Timothy Askew, MD;  Location: [facility] OR;  Service: General;  Laterality: N/A;  DESTRUCTION ANAL CONDYLOMA   LYSIS OF ADHESION  07/23/2012   Procedure: LYSIS OF ADHESION;  Surgeon: Timothy Askew, MD;  Location: [facility] OR;  Service: General;  Laterality: N/A;   LYSIS OF ADHESION N/A 10/04/2022   Procedure: LYSIS OF ADHESION;  Surgeon: Timothy Miner, MD;  Location: [facility] OR;  Service: General;  Laterality: N/A;   VENTRAL HERNIA REPAIR  05/20/2013   Dr Rogelio Seen HERNIA REPAIR N/A 05/20/2013   Procedure: VENTRAL HERNIA REPAIR ;  Surgeon: Timothy Askew, MD;  Location: [name] OR;  Service: General;  Laterality: N/A;   WART FULGURATION  02/19/2012   Procedure: FULGURATION ANAL WART;  Surgeon: Timothy Askew, MD;  Location: [facility] OR;  Service: General;  Laterality: N/A;  Destroy  Anal Condyloma    WISDOM TOOTH EXTRACTION  ~ 1983   WOUND DEBRIDEMENT N/A 06/03/2016   Procedure: ABDOMINAL WOUND EXPLORATION AND STITCH REMOVAL;  Surgeon: Timothy [name] III, MD;  Location: [facility];  Service: General;  Laterality: N/A;  ABDOMINAL WOUND EXPLORATION AND STITCH REMOVAL    SOCIAL HISTORY: Social History   Socioeconomic History   Marital status: Legally Separated    Spouse name: Not on file   Number of children: 3   Years of education: Not on file   Highest education level: Not on file  Occupational History   Occupation: unemployed  Tobacco Use   Smoking status: Every Day    Packs/day: 2.00    Years: 34.00    Additional pack years: 0.00    Total pack years: 68.00    Types: Cigarettes    Start date: 07/22/1981   Smokeless tobacco: Former    Types: Chew    Quit date: 12/12/2010   Tobacco comments:    Current 2 PPD smoker - Engineer, maintenance (IT) [employer]  Vaping Use   Vaping Use: Never used  Substance and Sexual Activity   Alcohol use: Not Currently    Comment: 6 pack of beer daily   Drug use: Yes    Types: Oxycodone, Marijuana    Comment: Smokes marijuana once a week   Sexual activity: Not on file  Other Topics Concern   Not on file  Social History Narrative   Lives with wife Mila Doce, Children Jammy (age 47) Loel Lofty (age 87) Redmond Baseman (age 14).  Unemployed less than high school education.  Updated 10/29/11   Social Determinants of Health   Financial Resource Strain: Medium Risk (07/17/2022)   Overall Financial Resource Strain (CARDIA)     Difficulty of Paying Living Expenses: Somewhat hard  Food Insecurity: No Food Insecurity (11/07/2022)   Hunger Vital Sign    Worried About  Running Out of Food in the Last Year: Never true    Ran Out of Food in the Last Year: Never true  Transportation Needs: No Transportation Needs (11/07/2022)   PRAPARE - Administrator, Civil Service (Medical): No    Lack of Transportation (Non-Medical): No  Physical Activity: Inactive (07/17/2022)   Exercise Vital Sign    Days of Exercise per Week: 0 days    Minutes of Exercise per Session: 0 min  Stress: Stress Concern Present (07/17/2022)   [employer] of Occupational Health - Occupational Stress Questionnaire    Feeling of Stress : To some extent  Social Connections: Socially Isolated (07/17/2022)   Social Connection and Isolation Panel [NHANES]    Frequency of Communication with Friends and Family: More than three times a week    Frequency of Social Gatherings with Friends and Family: Three times a week    Attends Religious Services: Never    Active Member of Clubs or Organizations: No    Attends Banker Meetings: Never    Marital Status: Divorced  Catering manager Violence: Not At Risk (10/22/2022)   Humiliation, Afraid, Rape, and Kick questionnaire    Fear of Current or Ex-Partner: No    Emotionally Abused: No    Physically Abused: No    Sexually Abused: No    FAMILY HISTORY: Family History  Problem Relation Age of Onset   Diabetes Mother    Parkinsonism Mother    Depression Mother    Alcohol abuse Father    Hypertension Father    Anxiety disorder Father    Ulcers Father    Colon polyps Father    Breast cancer Maternal Aunt    Diabetes Maternal Grandmother    Stroke Neg Hx    Heart disease Neg Hx    Colon cancer Neg Hx    Stomach cancer Neg Hx     ALLERGIES:  is allergic to [place] [zolpidem tartrate], bactroban [mupirocin calcium], zestril [lisinopril], darvon [propoxyphene], and morphine and  related.  MEDICATIONS:  Current Outpatient Medications  Medication Sig Dispense Refill   albuterol (VENTOLIN HFA) 108 (90 Base) MCG/ACT inhaler Inhale 2 puffs into the lungs every 6 (six) hours as needed for wheezing or shortness of breath.     amoxicillin-clavulanate (AUGMENTIN) 875-125 MG tablet Take 1 tablet by mouth 2 (two) times daily for 28 days. 56 tablet 0   busPIRone (BUSPAR) 15 MG tablet TAKE 1 TABLET BY MOUTH IN THE MORNING. (Patient taking differently: Take 15 mg by mouth daily.) 60 tablet 0   doxycycline (VIBRA-TABS) 100 MG tablet Take 1 tablet (100 mg total) by mouth 2 (two) times daily for 28 days. 56 tablet 0   gabapentin (NEURONTIN) 100 MG capsule TAKE 1 CAPSULE BY MOUTH FOUR TIMES DAILY AS NEEDED (Patient taking differently: Take 100 mg by mouth 4 (four) times daily as needed (Neuropathic pain).) 90 capsule 3   losartan (COZAAR) 100 MG tablet TAKE 1 TABLET (100 MG TOTAL) BY MOUTH DAILY. 30 tablet 0   methocarbamol (ROBAXIN) 500 MG tablet Take 1 tablet (500 mg total) by mouth every 8 (eight) hours as needed for muscle spasms. 20 tablet 2   ondansetron (ZOFRAN-ODT) 4 MG disintegrating tablet DISSOLVE 1 TABLET ON THE TONGUE EVERY 8 HOURS AS NEEDED FOR NAUSEA OR VOMITING 20 tablet 0   oxyCODONE (OXY IR/ROXICODONE) 5 MG immediate release tablet Take 1 tablet (5 mg total) by mouth every 6 (six) hours as needed for moderate pain. 30 tablet 0  potassium chloride (KLOR-CON M) 10 MEQ tablet Take 4 tablets (40 mEq total) by mouth daily for 5 days. 20 tablet 0   sertraline (ZOLOFT) 100 MG tablet TAKE 1 TABLET (100 MG TOTAL) BY MOUTH DAILY. 30 tablet 3   umeclidinium-vilanterol (ANORO ELLIPTA) 62.5-25 MCG/ACT AEPB Inhale 1 puff into the lungs daily. 1 each 6   No current facility-administered medications for this visit.    REVIEW OF SYSTEMS:    10 Point review of Systems was done is negative except as noted above.  PHYSICAL EXAMINATION: TELE-MED VISIT  LABORATORY DATA:  I have  reviewed the data as listed  .    Latest Ref Rng & Units 11/07/2022    1:05 PM 10/28/2022    5:16 PM 10/24/2022    1:03 AM  CBC  WBC 4.0 - 10.5 K/uL 8.5  11.1  6.9   Hemoglobin 13.0 - 17.0 g/dL 16.1  9.1  7.6   Hematocrit 39.0 - 52.0 % 35.0  28.9  24.1   Platelets 150 - 400 K/uL 658  929  707     .    Latest Ref Rng & Units 11/07/2022    1:05 PM 10/22/2022   10:55 AM 10/22/2022   10:48 AM  CMP  Glucose 70 - 99 mg/dL 92  096  045   BUN 6 - 20 mg/dL 13  12  10    Creatinine 0.61 - 1.24 mg/dL 4.09  8.11  9.14   Sodium 135 - 145 mmol/L 139  140  138   Potassium 3.5 - 5.1 mmol/L 4.0  3.7  3.7   Chloride 98 - 111 mmol/L 107  104  103   CO2 22 - 32 mmol/L 22   22   Calcium 8.9 - 10.3 mg/dL 9.4   8.1   Total Protein 6.5 - 8.1 g/dL 7.5   7.1   Total Bilirubin 0.3 - 1.2 mg/dL 0.2   0.5   Alkaline Phos 38 - 126 U/L 114   134   AST 15 - 41 U/L 14   26   ALT 0 - 44 U/L 12   23      RADIOGRAPHIC STUDIES: I have personally reviewed the radiological images as listed and agreed with the findings in the report. CT CHEST WO CONTRAST  Result Date: 10/22/2022 CLINICAL DATA:  Pulmonary nodules. EXAM: CT CHEST WITHOUT CONTRAST TECHNIQUE: Multidetector CT imaging of the chest was performed following the standard protocol without IV contrast. RADIATION DOSE REDUCTION: This exam was performed according to the departmental dose-optimization program which includes automated exposure control, adjustment of the mA and/or kV according to patient size and/or use of iterative reconstruction technique. COMPARISON:  Earlier abdominal CT scan, same date and prior chest CT from 07/11/2022 FINDINGS: Cardiovascular: The heart is normal in size. No pericardial effusion. The aorta is normal in caliber. Stable scattered atherosclerotic calcifications. Stable age advanced three-vessel coronary artery calcifications. Mediastinum/Nodes: Scattered mediastinal and hilar lymph nodes are stable. The esophagus is grossly. Lungs/Pleura:  Stable underlying emphysematous changes and pulmonary scarring. No focal infiltrates or pleural effusions. As demonstrated on the earlier abdominal CT scan there are irregular nodules at the left lung base. These were not present on the prior CT scan from 2023 and are most likely inflammatory or infectious. Recommend short-term follow-up noncontrast chest CT in 3 months to reassess. Upper Abdomen: Pneumoperitoneum is seen on earlier abdominal CT scan. Musculoskeletal: No significant bony findings. IMPRESSION: 1. Stable emphysematous changes and pulmonary scarring. 2. As demonstrated  on the earlier abdominal CT scan there are irregular nodules at the left lung base. These were not present on the prior CT scan from 2023 and are most likely inflammatory or infectious. Recommend short-term follow-up noncontrast chest CT in 3 months to reassess. 3. Stable age advanced three-vessel coronary artery calcifications. 4. Pneumoperitoneum is seen on earlier abdominal CT scan. Emphysema (ICD10-J43.9). Electronically Signed   By: Rudie Meyer M.D.   On: 10/22/2022 18:59   CT Abdomen Pelvis W Contrast  Result Date: 10/22/2022 CLINICAL DATA:  Follow up pneumoperitoneum EXAM: CT ABDOMEN AND PELVIS WITH CONTRAST TECHNIQUE: Multidetector CT imaging of the abdomen and pelvis was performed using the standard protocol following bolus administration of intravenous contrast. RADIATION DOSE REDUCTION: This exam was performed according to the departmental dose-optimization program which includes automated exposure control, adjustment of the mA and/or kV according to patient size and/or use of iterative reconstruction technique. CONTRAST:  75mL OMNIPAQUE IOHEXOL 350 MG/ML SOLN COMPARISON:  10/13/2022 FINDINGS: Lower chest: Bibasilar lung nodules largest measuring 1.2 cm on the right. No pericardial or pleural effusion. Hepatobiliary: No focal liver abnormality is seen. No gallstones, gallbladder wall thickening, or biliary dilatation.  Pancreas: Unremarkable. No pancreatic ductal dilatation or surrounding inflammatory changes. Spleen: Normal in size without focal abnormality. Adrenals/Urinary Tract: Adrenal glands are unremarkable. Kidneys are normal, without renal calculi, focal lesion, or hydronephrosis. Bladder is unremarkable. Stomach/Bowel: Mild dilatation of small bowel loops likely an ileus. Left lower quadrant colostomy with evidence of partial left hemicolectomy. Appendix not identified. Vascular/Lymphatic: Aortic atherosclerosis. No enlarged abdominal or pelvic lymph nodes. Reproductive: Prostate is unremarkable. Other: Significant decrease in pneumoperitoneum compared to the prior study. Left lower abdominal collection of fluid with [facility] of air measures 8 cm, significantly smaller than on the prior study. Musculoskeletal: Bilateral L5 spondylolysis. IMPRESSION: 1. Left abdominal fluid collection consistent with abscess has diminished in size compared to the prior study. 2. Mild small bowel dilatation likely representing ileus. 3. Significant decrease in pneumoperitoneum. Electronically Signed   By: Layla Maw M.D.   On: 10/22/2022 14:49   CT Head Wo Contrast  Result Date: 10/22/2022 CLINICAL DATA:  Head trauma EXAM: CT HEAD WITHOUT CONTRAST TECHNIQUE: Contiguous axial images were obtained from the base of the skull through the vertex without intravenous contrast. RADIATION DOSE REDUCTION: This exam was performed according to the departmental dose-optimization program which includes automated exposure control, adjustment of the mA and/or kV according to patient size and/or use of iterative reconstruction technique. COMPARISON:  CT Head 08/21/12 FINDINGS: Brain: No evidence of acute infarction, hemorrhage, hydrocephalus, extra-axial collection or mass lesion/mass effect. Vascular: No hyperdense vessel or unexpected calcification. Skull: Normal. Negative for fracture or focal lesion. Sinuses/Orbits: Small left mastoid effusion. No  middle ear effusion. Mild mucosal thickening right sphenoid sinus and right maxillary sinus orbits are unremarkable Other: None. IMPRESSION: No CT evidence of intracranial injury. Electronically Signed   By: Lorenza Cambridge M.D.   On: 10/22/2022 14:45   DG Chest 2 View  Result Date: 10/22/2022 CLINICAL DATA:  Possible sepsis, dizziness EXAM: CHEST - 2 VIEW COMPARISON:  10/14/2022 FINDINGS: Cardiac size is within normal limits. There is interval removal of endotracheal tube and NG tube. Lung fields are clear of any infiltrates or pulmonary edema. There is no pleural effusion or pneumothorax. There is significant interval decrease in amount of pneumoperitoneum under the right hemidiaphragm. IMPRESSION: There are no signs of pulmonary edema or focal pulmonary consolidation. There is significant interval decrease in amount of pneumoperitoneum under the  right hemidiaphragm in comparison with the study of 10/14/2022. Electronically Signed   By: Ernie Avena M.D.   On: 10/22/2022 11:32    ASSESSMENT & PLAN:   Wonderful 57 y.o. male with:  Thrombocytosis-likely reactive and related to recent intra-abdominal abscess related to anastomotic leak + inflammation from surgery plus smoking per day plus possible inflammation related to lung nodule of undetermined significance.  Presence of iron deficiency anemia in the context of surgical blood loss and poor absorption from bowel surgeries. Lung nodule seen on imaging study  History of recurrent intra-abdominal abscesses Smoker more than 1 pack/day History of previous heavy alcohol use  PLAN: -Discussed lab results from 11/07/2022 with the patient. CBC shows WBC improved from 11.1 to 8.5 K, hemoglobin levels improved from 9.1 to 11.0, hematocrit improved from 28.9 to 35.0, and improved Platelets from 929 to 658 K. CMP is stable. Patient is iron deficient at 42. Sedimentation rate was 51, Vitamin B-12 levels at 268. -Discussed JAK-2 mutation/MON panel  results,  which did not show any abnormalities. No evidence of a clonal  Bone marrow disorder.  -Recommended to start Iron Polysaccharides 150 mg daily and Cynacoblamin (B-12) SL 1000 mcg daily.   -Counseled patient on need for him to follow-up with his primary care physician for repeat CT chest in 3 months to reevaluate his left lower lobe lung nodule. -Extensively counseled patient on need for smoking cessation. -Continue to follow-up with PCP. 3 months repeat lab with PCP.  FOLLOW-UP: RTC with PCP  The total time spent in the appointment was 21 minutes* .  All of the patient's questions were answered with apparent satisfaction. The patient knows to call the clinic with any problems, questions or concerns.   Wyvonnia Lora MD MS AAHIVMS [facility] Hematology/Oncology Physician [address] [facility]  .*Total Encounter Time as defined by the Centers for Medicare and Medicaid Services includes, in addition to the face-to-face time of a patient visit (documented in the note above) non-face-to-face time: obtaining and reviewing outside history, ordering and reviewing medications, tests or procedures, care coordination (communications with other health care professionals or caregivers) and documentation in the medical record.   I, Ok Edwards, am acting as a Neurosurgeon for Wyvonnia Lora, MD.  .I have reviewed the above documentation for accuracy and completeness, and I agree with the above. Johney Maine MD

## 2022-11-22 ENCOUNTER — Ambulatory Visit (HOSPITAL_COMMUNITY): Payer: Medicare Other | Admitting: Nurse Practitioner

## 2022-12-02 ENCOUNTER — Other Ambulatory Visit: Payer: Self-pay | Admitting: Student

## 2022-12-02 DIAGNOSIS — R110 Nausea: Secondary | ICD-10-CM

## 2022-12-04 ENCOUNTER — Other Ambulatory Visit: Payer: Self-pay | Admitting: Student

## 2022-12-06 ENCOUNTER — Other Ambulatory Visit (HOSPITAL_BASED_OUTPATIENT_CLINIC_OR_DEPARTMENT_OTHER): Payer: Self-pay | Admitting: Student

## 2022-12-06 DIAGNOSIS — R1030 Lower abdominal pain, unspecified: Secondary | ICD-10-CM

## 2022-12-10 ENCOUNTER — Encounter (HOSPITAL_COMMUNITY): Payer: Self-pay

## 2022-12-10 ENCOUNTER — Ambulatory Visit (HOSPITAL_COMMUNITY): Payer: Medicare Other | Attending: Student

## 2022-12-12 ENCOUNTER — Ambulatory Visit: Payer: Medicare Other | Admitting: Student

## 2022-12-17 NOTE — Progress Notes (Unsigned)
SUBJECTIVE:   CHIEF COMPLAINT / HPI: Follow up and med refills  Last seen in [facility] on 10/28/2022-needs CT chest in July for lung nodule that was seen on imaging  Seen by hematology on 4/18 due to thrombocytosis and thought to be most likely due to reactive process due to intra-abdominal abscess and anastomotic leak plus inflammation from surgery and smoking.  Also has lung nodule of undetermined significance.  Recommended 53-month PCP follow-up for labs (around August)  Had removal of infected mesh on 5/1 by general surgery  Seen by general surgery on 5/17 for abdominal pain for 3 days at that time At that time had exquisite tenderness to lower abdomen and suprapubic region Has history of leak and second surgery and was recommended to get a CT abdomen This was not performed-he had called back on 5/21 to use surgery center and says after stopping the iron medication given by oncologist that the this had good bowel movements and abdominal pain improved He has follow-up with surgery tomorrow 5/30 at 2:30 PM  Can't do iron pill-consitpation and abdomen swollen when he took it-quit taking it and has been having good BM now through ostomy  Feels very exhausted No fevers but some abdominal pain around incision site Good ostomy output Feels better after stopping iron pill Vomiting in evening when eating at times Still smoking No fevers Not hungry any more-has lost a lot of weight 164 in april>146 now Sweating at night time No hemoptysis No fevers Smoking still Sweating at night Coughing still time to time Using albuterol a lot-has not seen pulmonology yet. Unable to fill anoro ellipta through pharmacy but liked it when he took it  PERTINENT  PMH / PSH:   OBJECTIVE:   BP (!) 131/98   Pulse (!) 104   Ht 5\' 9"  (1.753 m)   Wt 146 lb 3.2 oz (66.3 kg)   SpO2 99%   BMI 21.59 kg/m   General: Frail appearing, awake, alert, responsive to questions Head: Normocephalic atraumatic CV:  Regular rate and rhythm no murmurs rubs or gallops Respiratory: Clear to ausculation bilaterally, mild expiratory wheeze in upper lungs, chest rises symmetrically,  no increased work of breathing Abdomen: Soft, mildly tender around incision site-bandage CDI, ostomy bag with brown output, stoma appears patent pink, pale, no ulcerations, BS mildly hyperactive Extremities: Moves upper and lower extremities freely Neuro: No focal deficits Skin: No rashes or lesions visualized   ASSESSMENT/PLAN:   Intra-abdominal abscess (HCC) Infected mesh that was removed on 5/1-still have some abdominal pain around incision site.  No fevers and no excessive drainage from the site.  Bandage clean CDI.  Does have close general surgery follow-up tomorrow.  Will obtain CBC as well to check for anemia/leukocytosis. -CBC with differential sign -General surgery follow-up  Other fatigue Likely in the setting of many recent surgeries.  Also has multiple B symptoms including fatigue, night sweats, cough, weight loss however denies any hemoptysis or fevers. Has decreased appetite possibly due to many abdominal surgeries and nausea.  Does have lung nodule that was seen on previous imaging but will need repeat in July.  Will check for anemia again today and switch his iron to ferrous gluconate which may be more tolerable as well as add MiraLAX alongside this. Sleep study previously ordered but do not see it performed.  -Follow-up in July with me -CBC with differential -TSH -Ferrous gluconate -CT lung in July -Reassess pulmonology referral at next visit   COPD  Messaging. Dr  Koval/pharmacy team for covered inhalers as patient unable to pick up anoro -refilled albuterol  Refilled medications today  [name], MD [facility]

## 2022-12-18 ENCOUNTER — Other Ambulatory Visit (HOSPITAL_COMMUNITY): Payer: Self-pay

## 2022-12-18 ENCOUNTER — Ambulatory Visit (INDEPENDENT_AMBULATORY_CARE_PROVIDER_SITE_OTHER): Payer: Medicare Other | Admitting: Student

## 2022-12-18 ENCOUNTER — Encounter: Payer: Self-pay | Admitting: Student

## 2022-12-18 VITALS — BP 131/98 | HR 104 | Ht 69.0 in | Wt 146.2 lb

## 2022-12-18 DIAGNOSIS — F339 Major depressive disorder, recurrent, unspecified: Secondary | ICD-10-CM

## 2022-12-18 DIAGNOSIS — R5383 Other fatigue: Secondary | ICD-10-CM

## 2022-12-18 DIAGNOSIS — J449 Chronic obstructive pulmonary disease, unspecified: Secondary | ICD-10-CM

## 2022-12-18 DIAGNOSIS — I10 Essential (primary) hypertension: Secondary | ICD-10-CM

## 2022-12-18 DIAGNOSIS — K651 Peritoneal abscess: Secondary | ICD-10-CM

## 2022-12-18 DIAGNOSIS — R110 Nausea: Secondary | ICD-10-CM | POA: Diagnosis not present

## 2022-12-18 DIAGNOSIS — F410 Panic disorder [episodic paroxysmal anxiety] without agoraphobia: Secondary | ICD-10-CM | POA: Diagnosis present

## 2022-12-18 MED ORDER — FERROUS GLUCONATE 324 (38 FE) MG PO TABS: 324.0000 mg | ORAL_TABLET | ORAL | 0 refills | Status: DC

## 2022-12-18 MED ORDER — ONDANSETRON 4 MG PO TBDP
ORAL_TABLET | ORAL | 0 refills | Status: DC
Start: 2022-12-18 — End: 2023-01-02

## 2022-12-18 MED ORDER — LOSARTAN POTASSIUM 100 MG PO TABS
100.0000 mg | ORAL_TABLET | Freq: Every day | ORAL | 0 refills | Status: DC
Start: 2022-12-18 — End: 2023-01-12

## 2022-12-18 MED ORDER — POLYETHYLENE GLYCOL 3350 17 GM/SCOOP PO POWD
17.0000 g | Freq: Every day | ORAL | 0 refills | Status: DC | PRN
Start: 2022-12-18 — End: 2023-01-12

## 2022-12-18 MED ORDER — METHOCARBAMOL 500 MG PO TABS: 500.0000 mg | ORAL_TABLET | Freq: Three times a day (TID) | ORAL | 2 refills | Status: DC | PRN

## 2022-12-18 MED ORDER — BUSPIRONE HCL 15 MG PO TABS
15.0000 mg | ORAL_TABLET | Freq: Every morning | ORAL | 0 refills | Status: DC
Start: 2022-12-18 — End: 2023-01-12

## 2022-12-18 MED ORDER — ALBUTEROL SULFATE HFA 108 (90 BASE) MCG/ACT IN AERS: 2.0000 | INHALATION_SPRAY | Freq: Four times a day (QID) | RESPIRATORY_TRACT | 3 refills | Status: DC | PRN

## 2022-12-18 MED ORDER — ANORO ELLIPTA 62.5-25 MCG/ACT IN AEPB
1.0000 | INHALATION_SPRAY | Freq: Every day | RESPIRATORY_TRACT | 6 refills | Status: DC
Start: 2022-12-18 — End: 2023-01-24

## 2022-12-18 MED ORDER — SERTRALINE HCL 100 MG PO TABS: 100.0000 mg | ORAL_TABLET | Freq: Every day | ORAL | 3 refills | Status: DC

## 2022-12-18 NOTE — Patient Instructions (Addendum)
It was great to see you! Thank you for allowing me to participate in your care!   I recommend that you always bring your medications to each appointment as this makes it easy to ensure we are on the correct medications and helps [place] not miss when refills are needed.  Our plans for today:  - Come back in 2 months in July-we need to get CT Lung then - Return to care if having fevers, worsening vomiting, abdominal pain - If able-take iron pill with miralax - Keep follow up with general surgery - Refilled meds for you  We are checking some labs today, I will call you if they are abnormal will send you a MyChart message or a letter if they are normal.  If you do not hear about your labs in the next 2 weeks please let us know.  Take care and seek immediate care sooner if you develop any concerns. Please remember to show up 15 minutes before your scheduled appointment time!  [name], MD [facility]

## 2022-12-18 NOTE — Assessment & Plan Note (Signed)
Infected mesh that was removed on 5/1-still have some abdominal pain around incision site.  No fevers and no excessive drainage from the site.  Bandage clean CDI.  Does have close general surgery follow-up tomorrow.  Will obtain CBC as well to check for anemia/leukocytosis. -CBC with differential sign -General surgery follow-up

## 2022-12-18 NOTE — Telephone Encounter (Signed)
Contacted pharmacy to determine issue with prescription for Anoro as it appears to be on Medicaid formulary.   Pharmacist shared that this medication has been in short supply from wholesaler and they have had to send to another store recently due to lack of supply.    She will send to another pharmacy again if needed this month.  No additional action needed by [facility] practice at this time.

## 2022-12-18 NOTE — Assessment & Plan Note (Addendum)
Likely in the setting of many recent surgeries.  Also has multiple B symptoms including fatigue, night sweats, cough, weight loss however denies any hemoptysis or fevers. Has decreased appetite possibly due to many abdominal surgeries and nausea.  Does have lung nodule that was seen on previous imaging but will need repeat in July.  Will check for anemia again today and switch his iron to ferrous gluconate which may be more tolerable as well as add MiraLAX alongside this. Sleep study previously ordered but do not see it performed.  -Follow-up in July with me -CBC with differential -TSH -Ferrous gluconate -CT lung in July -Reassess pulmonology referral at next visit

## 2022-12-18 NOTE — Telephone Encounter (Signed)
-----   Message from [name], MD sent at 12/18/2022 11:46 AM EDT ----- Do you know what inhaler would be covered for him? Anoro ellipta he has not been able to get through his pharmacy.  Mayuri

## 2022-12-19 ENCOUNTER — Telehealth: Payer: Self-pay

## 2022-12-19 LAB — CBC WITH DIFFERENTIAL/PLATELET
Basophils Absolute: 0.1 10*3/uL (ref 0.0–0.2)
Basos: 1 %
EOS (ABSOLUTE): 0 10*3/uL (ref 0.0–0.4)
Eos: 0 %
Hematocrit: 42.8 % (ref 37.5–51.0)
Hemoglobin: 13.4 g/dL (ref 13.0–17.7)
Immature Grans (Abs): 0 10*3/uL (ref 0.0–0.1)
Immature Granulocytes: 0 %
Lymphocytes Absolute: 2.3 10*3/uL (ref 0.7–3.1)
Lymphs: 20 %
MCH: 28.8 pg (ref 26.6–33.0)
MCHC: 31.3 g/dL — ABNORMAL LOW (ref 31.5–35.7)
MCV: 92 fL (ref 79–97)
Monocytes Absolute: 0.7 10*3/uL (ref 0.1–0.9)
Monocytes: 6 %
Neutrophils Absolute: 8.6 10*3/uL — ABNORMAL HIGH (ref 1.4–7.0)
Neutrophils: 73 %
Platelets: 851 10*3/uL (ref 150–450)
RBC: 4.65 x10E6/uL (ref 4.14–5.80)
RDW: 14.4 % (ref 11.6–15.4)
WBC: 11.7 10*3/uL — ABNORMAL HIGH (ref 3.4–10.8)

## 2022-12-19 LAB — TSH RFX ON ABNORMAL TO FREE T4: TSH: 0.883 u[IU]/mL (ref 0.450–4.500)

## 2022-12-19 NOTE — Telephone Encounter (Signed)
A Prior Authorization was initiated for this patients MOUNJARO through CoverMyMeds.   Key: [facility]

## 2022-12-20 NOTE — Telephone Encounter (Signed)
Reviewed and agree with Dr Koval's plan.   

## 2022-12-20 NOTE — Telephone Encounter (Signed)
CORRECTION - PA WAS FOR METHOCARBAMOL  Prior Auth for patients medication METHOCARBAMOL TABS approved (20 units per 7 days) by [facility] MEDICARE from 12/19/22 until further notice.  CoverMyMeds Key: [facility] PA Case ID #: [id number]

## 2022-12-22 ENCOUNTER — Encounter: Payer: Self-pay | Admitting: Student

## 2022-12-23 NOTE — Telephone Encounter (Signed)
Reviewed and agree with Dr Koval's plan.   

## 2022-12-25 ENCOUNTER — Other Ambulatory Visit (HOSPITAL_COMMUNITY): Payer: Self-pay

## 2023-01-01 ENCOUNTER — Other Ambulatory Visit: Payer: Self-pay

## 2023-01-01 ENCOUNTER — Telehealth: Payer: Self-pay | Admitting: Student

## 2023-01-01 ENCOUNTER — Encounter (HOSPITAL_COMMUNITY): Payer: Self-pay | Admitting: *Deleted

## 2023-01-01 ENCOUNTER — Emergency Department (HOSPITAL_COMMUNITY)
Admission: EM | Admit: 2023-01-01 | Discharge: 2023-01-01 | Discharge status: Against Medical Advice | Payer: Medicare Other | Attending: Emergency Medicine | Admitting: Emergency Medicine

## 2023-01-01 ENCOUNTER — Other Ambulatory Visit: Payer: Self-pay | Admitting: Student

## 2023-01-01 ENCOUNTER — Emergency Department (HOSPITAL_COMMUNITY)
Admission: EM | Admit: 2023-01-01 | Discharge: 2023-01-02 | Disposition: A | Discharge status: Home / Self Care | Payer: Medicare Other | Source: Home / Self Care | Attending: Emergency Medicine | Admitting: Emergency Medicine

## 2023-01-01 DIAGNOSIS — K9401 Colostomy hemorrhage: Secondary | ICD-10-CM | POA: Insufficient documentation

## 2023-01-01 DIAGNOSIS — I10 Essential (primary) hypertension: Secondary | ICD-10-CM | POA: Diagnosis not present

## 2023-01-01 DIAGNOSIS — Z5321 Procedure and treatment not carried out due to patient leaving prior to being seen by health care provider: Secondary | ICD-10-CM | POA: Insufficient documentation

## 2023-01-01 DIAGNOSIS — R109 Unspecified abdominal pain: Secondary | ICD-10-CM | POA: Diagnosis not present

## 2023-01-01 DIAGNOSIS — G894 Chronic pain syndrome: Secondary | ICD-10-CM

## 2023-01-01 DIAGNOSIS — Z79899 Other long term (current) drug therapy: Secondary | ICD-10-CM | POA: Insufficient documentation

## 2023-01-01 DIAGNOSIS — F17200 Nicotine dependence, unspecified, uncomplicated: Secondary | ICD-10-CM | POA: Insufficient documentation

## 2023-01-01 DIAGNOSIS — R197 Diarrhea, unspecified: Secondary | ICD-10-CM | POA: Insufficient documentation

## 2023-01-01 DIAGNOSIS — D649 Anemia, unspecified: Secondary | ICD-10-CM | POA: Insufficient documentation

## 2023-01-01 DIAGNOSIS — K634 Enteroptosis: Secondary | ICD-10-CM | POA: Insufficient documentation

## 2023-01-01 DIAGNOSIS — K9419 Other complications of enterostomy: Secondary | ICD-10-CM

## 2023-01-01 DIAGNOSIS — J449 Chronic obstructive pulmonary disease, unspecified: Secondary | ICD-10-CM | POA: Insufficient documentation

## 2023-01-01 LAB — URINALYSIS, ROUTINE W REFLEX MICROSCOPIC
Bacteria, UA: NONE SEEN
Bilirubin Urine: NEGATIVE
Glucose, UA: 50 mg/dL — AB
Ketones, ur: NEGATIVE mg/dL
Leukocytes,Ua: NEGATIVE
Nitrite: NEGATIVE
Protein, ur: NEGATIVE mg/dL
Specific Gravity, Urine: 1.008 (ref 1.005–1.030)
pH: 8 (ref 5.0–8.0)

## 2023-01-01 LAB — COMPREHENSIVE METABOLIC PANEL
ALT: 13 U/L (ref 0–44)
AST: 21 U/L (ref 15–41)
Albumin: 2.9 g/dL — ABNORMAL LOW (ref 3.5–5.0)
Alkaline Phosphatase: 108 U/L (ref 38–126)
Anion gap: 11 (ref 5–15)
BUN: 8 mg/dL (ref 6–20)
CO2: 30 mmol/L (ref 22–32)
Calcium: 8.2 mg/dL — ABNORMAL LOW (ref 8.9–10.3)
Chloride: 97 mmol/L — ABNORMAL LOW (ref 98–111)
Creatinine, Ser: 0.83 mg/dL (ref 0.61–1.24)
GFR, Estimated: >60 mL/min (ref >60)
Glucose, Bld: 94 mg/dL (ref 70–99)
Potassium: 3.5 mmol/L (ref 3.5–5.1)
Sodium: 138 mmol/L (ref 135–145)
Total Bilirubin: 0.2 mg/dL — ABNORMAL LOW (ref 0.3–1.2)
Total Protein: 6.2 g/dL — ABNORMAL LOW (ref 6.5–8.1)

## 2023-01-01 LAB — CBC
HCT: 36.6 % — ABNORMAL LOW (ref 39.0–52.0)
Hemoglobin: 11.5 g/dL — ABNORMAL LOW (ref 13.0–17.0)
MCH: 28.5 pg (ref 26.0–34.0)
MCHC: 31.4 g/dL (ref 30.0–36.0)
MCV: 90.8 fL (ref 80.0–100.0)
Platelets: 320 10*3/uL (ref 150–400)
RBC: 4.03 MIL/uL — ABNORMAL LOW (ref 4.22–5.81)
RDW: 15.9 % — ABNORMAL HIGH (ref 11.5–15.5)
WBC: 6.9 10*3/uL (ref 4.0–10.5)
nRBC: 0 % (ref 0.0–0.2)

## 2023-01-01 LAB — LIPASE, BLOOD: Lipase: 33 U/L (ref 11–51)

## 2023-01-01 NOTE — ED Provider Notes (Addendum)
[place] EMERGENCY DEPARTMENT AT [facility] Provider Note  CSN: [id number] Arrival date & time: 01/01/23 2134  Chief Complaint(s) bleeding from colostomy  HPI Timothy [name] is a 57 y.o. male with a past medical history listed below status post colectomy with an ostomy bag for recurrent diverticulitis who recently had an abdominal wall I&D due to an infected mesh presents to the emergency department for stomal bleeding.  He reports that his stoma has been doing well up until 3 days ago.  It is normally pink and small.  It is now pretreated and reddish with slight bleeding.  He also endorses burning sensation around the stoma on the skin.  He reports abdominal cramping with associated nausea and nonbloody nonbilious emesis.  Patient also reported decreased ostomy output.   The history is provided by the patient.    Past Medical History Past Medical History:  Diagnosis Date   Acute bronchitis 02/15/2013   Alcohol abuse    Anxiety    Bipolar disorder (HCC)    Bronchitis    history   Bronchitis    Cellulitis    - left knee-2009   Chronic pain    Complication of anesthesia    Condyloma 02/04/2012   Depression    Diverticulosis    By colonoscopy June 2005   Dyspnea    on exertion at times   Elevated CK 09/2011   Emphysema lung (HCC)    Emphysema of lung (HCC)    Family history of anesthesia complication    Mother N/V   GERD (gastroesophageal reflux disease)    Glaucoma syndrome    does not use eye drops, "They burn".   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 Presumably secondary to diverticulosis.   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   Ventral hernia 01/11/2013   Repaired with mesh 05/20/13   Wrist pain 01/26/2019   Patient Active Problem List   Diagnosis Date Noted   Irritant contact dermatitis associated with fecal stoma 11/15/2022   Colostomy complication (HCC) 11/15/2022   Chronic wound infection of abdomen 10/24/2022   Abdominal wall abscess 10/23/2022   Thrombocytosis 10/22/2022   Hypokalemia 10/19/2022   Colostomy in place [facility]) 10/18/2022   History of creation of ostomy (HCC) 10/13/2022   Malnutrition of moderate degree 10/09/2022   Intra-abdominal abscess (HCC) 10/04/2022   Suspected sleep apnea 06/24/2022   Hematemesis with nausea 07/25/2021   Depression, recurrent (HCC) 05/08/2021   Lung nodule seen on imaging study 05/19/2020   Chronic obstructive pulmonary disease (HCC) 07/07/2017   Mood disorder (HCC) 04/13/2017   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HCC) 03/31/2017   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 Authorizing Provider  ondansetron (ZOFRAN-ODT) 4 MG disintegrating tablet Take 1 tablet (4 mg total) by mouth every 8 (eight) hours as needed for up to 3 days for  nausea or vomiting. 01/02/23 01/05/23 Yes Stefano Trulson, Amadeo Garnet, MD  albuterol (VENTOLIN HFA) 108 (90 Base) MCG/ACT inhaler Inhale 2 puffs into the lungs every 6 (six) hours as needed for wheezing or shortness of breath. 12/18/22   [name], MD  busPIRone (BUSPAR) 15 MG tablet Take 1 tablet (15 mg total) by mouth every morning. 12/18/22   [name], MD  Cyanocobalamin (B-12) 1000 MCG SUBL Place 1,000 mcg under the tongue daily. 11/18/22   Johney Maine, MD  ferrous gluconate (FERGON) 324 MG tablet Take 1 tablet (324 mg total) by mouth every other day. 12/18/22   [name], MD  gabapentin (NEURONTIN) 100 MG capsule TAKE 1 CAPSULE BY MOUTH 4 TIMES DAILY AS NEEDED 01/01/23   [name], MD  losartan (COZAAR) 100 MG tablet Take 1 tablet (100 mg total) by mouth daily. 12/18/22   [name], MD  methocarbamol (ROBAXIN) 500 MG tablet Take 1 tablet (500 mg total) by mouth every 8 (eight) hours as needed for muscle spasms. 12/18/22   [name], MD  oxyCODONE (OXY IR/ROXICODONE) 5 MG immediate release tablet Take 1 tablet (5 mg total) by mouth every 6 (six) hours as needed for moderate pain. 10/19/22   Karie Soda, MD  polyethylene glycol powder (GLYCOLAX/MIRALAX) 17 GM/SCOOP powder Take 17 g by mouth daily as needed. 12/18/22   [name], MD  potassium chloride (KLOR-CON M) 10 MEQ tablet Take 4 tablets (40 mEq total) by mouth daily for 5 days. 10/19/22 10/24/22  Karie Soda, MD  sertraline (ZOLOFT) 100 MG tablet Take 1 tablet (100 mg total) by mouth daily. 12/18/22   [name], MD  umeclidinium-vilanterol (ANORO ELLIPTA) 62.5-25 MCG/ACT AEPB Inhale 1 puff into the lungs daily. 12/18/22   [name], MD  lurasidone (LATUDA) 40 MG TABS tablet TAKE 1 TABLET(40 MG) BY MOUTH DAILY WITH BREAKFAST 05/12/19 05/08/20  Arlyce Harman, MD  mometasone-formoterol (DULERA) 100-5 MCG/ACT AERO Inhale 2 puffs into the lungs 2 (two) times daily. 02/22/19 05/19/20  Arlyce Harman, MD                                                                                                                                    Allergies Ambien [zolpidem tartrate], Bactroban [mupirocin calcium], Zestril [lisinopril], Darvon [propoxyphene], and Morphine and codeine  Review of Systems Review of Systems As noted in HPI  Physical Exam Vital Signs  I have reviewed the triage vital signs BP (!) 171/94   Pulse 69   Temp 98 F (36.7 C) (Oral)   Resp 19   Ht 5\' 9"  (1.753 m)   Wt 66.3 kg   SpO2 100%   BMI 21.58 kg/m   Physical Exam Vitals reviewed.  Constitutional:      General: He is not in acute distress.    Appearance: He is well-developed. He is not diaphoretic.  HENT:     Head: Normocephalic and atraumatic.     Right Ear: External ear normal.     Left Ear: External ear normal.     Nose: Nose normal.     Mouth/Throat:     Mouth: Mucous membranes are moist.  Eyes:     General: No scleral icterus.    Conjunctiva/sclera: Conjunctivae normal.  Neck:     Trachea: Phonation normal.  Cardiovascular:     Rate and Rhythm: Normal rate and regular rhythm.  Pulmonary:     Effort: Pulmonary effort is normal. No respiratory distress.     Breath sounds: No stridor.  Abdominal:     General: There is no distension.    Musculoskeletal:        General: Normal range of motion.     Cervical back: Normal range of motion.  Neurological:     Mental Status: He is alert and oriented to person, place, and time.  Psychiatric:        Behavior: Behavior normal.     ED Results and Treatments Labs (all labs ordered are listed, but only abnormal results are displayed) Labs Reviewed  COMPREHENSIVE METABOLIC PANEL - Abnormal; Notable for the following components:      Result Value   Chloride 97 (*)    Calcium 8.2 (*)    Total Protein 6.2 (*)    Albumin 2.9 (*)    Total Bilirubin 0.2 (*)    All other components within normal limits  CBC - Abnormal; Notable for the  following components:   RBC 4.03 (*)    Hemoglobin 11.5 (*)    HCT 36.6 (*)    RDW 15.9 (*)    All other components within normal limits  URINALYSIS, ROUTINE W REFLEX MICROSCOPIC - Abnormal; Notable for the following components:   Color, Urine STRAW (*)    Glucose, UA 50 (*)    Hgb urine dipstick SMALL (*)    All other components within normal limits  LIPASE, BLOOD                                                                                                                         EKG  EKG Interpretation  Date/Time:    Ventricular Rate:    PR Interval:    QRS Duration:   QT Interval:    QTC Calculation:   R Axis:     Text Interpretation:         Radiology CT ABDOMEN PELVIS  W CONTRAST  Result Date: 01/02/2023 CLINICAL DATA:  Bowel obstruction suspected. Bleeding from colostomy/stoma EXAM: CT ABDOMEN AND PELVIS WITH CONTRAST TECHNIQUE: Multidetector CT imaging of the abdomen and pelvis was performed using the standard protocol following bolus administration of intravenous contrast. RADIATION DOSE REDUCTION: This exam was performed according to the departmental dose-optimization program which includes automated exposure control, adjustment of the mA and/or kV according to patient size and/or use of iterative reconstruction technique. CONTRAST:  75mL OMNIPAQUE IOHEXOL 350 MG/ML SOLN COMPARISON:  10/22/2022 FINDINGS: Lower chest: No acute abnormality Hepatobiliary: No focal hepatic abnormality. Gallbladder unremarkable. Pancreas: No focal abnormality or ductal dilatation. Spleen: No focal abnormality.  Normal size. Adrenals/Urinary Tract: No adrenal abnormality. No focal renal abnormality. No stones or hydronephrosis. Urinary bladder is unremarkable. Stomach/Bowel: Left lower quadrant ostomy status post left hemicolectomy. No evidence of bowel obstruction. Stomach, small bowel and large bowel grossly unremarkable. Vascular/Lymphatic: Aortic atherosclerosis. No evidence of aneurysm or  adenopathy. Reproductive: No visible focal abnormality. Other: No free fluid or free air. Mild skin thickening at the stoma in the left lower quadrant. Musculoskeletal: No acute bony abnormality. IMPRESSION: Left lower quadrant ostomy. Mild skin thickening at the stoma. No evidence of bowel obstruction. Aortic atherosclerosis. Electronically Signed   By: Charlett Nose M.D.   On: 01/02/2023 00:33    Medications Ordered in ED Medications  iohexol (OMNIPAQUE) 350 MG/ML injection 75 mL (75 mLs Intravenous Contrast Given 01/02/23 0028)   Procedures Procedures  (including critical care time) Medical Decision Making / ED Course   Medical Decision Making Amount and/or Complexity of Data Reviewed Labs: ordered. Decision-making details documented in ED Course. Radiology: ordered and independent interpretation performed. Decision-making details documented in ED Course.  Risk Prescription drug management. Decision regarding hospitalization.    Protruding red stoma with mild bleeding. Decreased ostomy output and N/V.  Will need to rule out incarcerated stoma, bowel obstruction. It does not appear to be infected, but will get labs to look for leukocytosis.  Applying cold compress while work up initiated.  CBC without leukocytosis.  Mild anemia with relatively stable hemoglobin. CMP without significant electrolyte derangements or renal sufficiency.  No evidence of biliary obstruction or pancreatitis.  CT of the abdomen without evidence of obstruction or incarceration. No need for admission  After ice pack, stoma was able to be reduced.  Recommended follow-up with Dr. Carolynne Edouard.    Final Clinical Impression(s) / ED Diagnoses Final diagnoses:  Intestinal stoma prolapse (HCC)  Bleeding from colostomy stoma [facility]   The patient appears reasonably screened and/or stabilized for discharge and I doubt any other medical condition or other Scnetx requiring further screening, evaluation, or treatment in the ED  at this time. I have discussed the findings, Dx and Tx plan with the patient/family who expressed understanding and agree(s) with the plan. Discharge instructions discussed at length. The patient/family was given strict return precautions who verbalized understanding of the instructions. No further questions at time of discharge.  Disposition: Discharge  Condition: Good  ED Discharge Orders          Ordered    ondansetron (ZOFRAN-ODT) 4 MG disintegrating tablet  Every 8 hours PRN        01/02/23 0201             Follow Up: Griselda Miner, MD [address] [place] [address] [phone]  Call  to schedule an appointment for close follow up     This chart was dictated using voice recognition  software.  Despite best efforts to proofread,  errors can occur which can change the documentation meaning.    Nira Conn, MD 01/02/23 1610    Nira Conn, MD 01/02/23 [phone]

## 2023-01-01 NOTE — ED Triage Notes (Signed)
The pt has a colostomy since march he was here earlier today but he left without being seen  now the area is larger and he reports bleeding from the stoma

## 2023-01-01 NOTE — ED Triage Notes (Signed)
Patient reports swelling and redness at colostomy site x 2-3 days as well as open wound continuing from recent surgery. Endorses watery diarrhea output from colostomy. Followed by [facility].

## 2023-01-01 NOTE — Telephone Encounter (Signed)
Called patient to discuss lab results as have him be able to contact him on his phone for a while.  Was going to discuss thrombocytosis and considering pulmonology referral again.  Patient had an ED visit today but eloped.  I was on the phone with his son who says that he was in a lot of pain and was told that it could be a small bowel obstruction versus a hernia from his general surgeon.  I discussed with him to please return to care to the emergency department if things worsen overnight.  They are amenable to being seen in clinic tomorrow for this as well.  I have scheduled him for access to care tomorrow at 10:30 AM.  Will route to access to care provider

## 2023-01-01 NOTE — ED Notes (Signed)
Patient stated that no body in the ED was paying him any attention and they were taking other patients before him and he was sent by his  doctor and that he was leaving . Patient was moved off the floor.

## 2023-01-02 ENCOUNTER — Ambulatory Visit: Payer: Self-pay

## 2023-01-02 ENCOUNTER — Emergency Department (HOSPITAL_COMMUNITY): Payer: Medicare Other

## 2023-01-02 DIAGNOSIS — R109 Unspecified abdominal pain: Secondary | ICD-10-CM | POA: Diagnosis not present

## 2023-01-02 MED ORDER — ONDANSETRON 4 MG PO TBDP: 4.0000 mg | ORAL_TABLET | Freq: Three times a day (TID) | ORAL | 0 refills | Status: AC | PRN

## 2023-01-02 MED ORDER — IOHEXOL 350 MG/ML SOLN
75.0000 mL | Freq: Once | INTRAVENOUS | Status: AC | PRN
  Administered 2023-01-02: 75 mL via INTRAVENOUS

## 2023-01-08 ENCOUNTER — Encounter (HOSPITAL_COMMUNITY): Payer: Self-pay | Admitting: Adult Health

## 2023-01-08 ENCOUNTER — Encounter (HOSPITAL_COMMUNITY): Payer: Self-pay

## 2023-01-08 ENCOUNTER — Inpatient Hospital Stay: Payer: Medicare Other

## 2023-01-08 ENCOUNTER — Ambulatory Visit (HOSPITAL_COMMUNITY): Admission: EM | Admit: 2023-01-08 | Discharge: 2023-01-08 | Payer: Medicare Other

## 2023-01-08 ENCOUNTER — Other Ambulatory Visit: Payer: Self-pay

## 2023-01-08 ENCOUNTER — Inpatient Hospital Stay (HOSPITAL_COMMUNITY)
Admission: AD | Admit: 2023-01-08 | Discharge: 2023-01-12 | DRG: 897 | Disposition: A | Discharge status: Home / Self Care | Payer: Medicare Other | Source: Intra-hospital | Attending: Psychiatry | Admitting: Psychiatry

## 2023-01-08 ENCOUNTER — Emergency Department (HOSPITAL_COMMUNITY)
Admission: EM | Admit: 2023-01-08 | Discharge: 2023-01-08 | Disposition: A | Discharge status: Other Acute Inpatient Hospital | Payer: Medicare Other | Source: Home / Self Care | Attending: Student | Admitting: Student

## 2023-01-08 DIAGNOSIS — Z888 Allergy status to other drugs, medicaments and biological substances status: Secondary | ICD-10-CM

## 2023-01-08 DIAGNOSIS — Z79899 Other long term (current) drug therapy: Secondary | ICD-10-CM | POA: Insufficient documentation

## 2023-01-08 DIAGNOSIS — G4700 Insomnia, unspecified: Secondary | ICD-10-CM | POA: Diagnosis present

## 2023-01-08 DIAGNOSIS — F1994 Other psychoactive substance use, unspecified with psychoactive substance-induced mood disorder: Secondary | ICD-10-CM | POA: Diagnosis present

## 2023-01-08 DIAGNOSIS — Z818 Family history of other mental and behavioral disorders: Secondary | ICD-10-CM

## 2023-01-08 DIAGNOSIS — Z885 Allergy status to narcotic agent status: Secondary | ICD-10-CM | POA: Diagnosis not present

## 2023-01-08 DIAGNOSIS — I10 Essential (primary) hypertension: Secondary | ICD-10-CM | POA: Insufficient documentation

## 2023-01-08 DIAGNOSIS — R451 Restlessness and agitation: Secondary | ICD-10-CM | POA: Diagnosis not present

## 2023-01-08 DIAGNOSIS — F1090 Alcohol use, unspecified, uncomplicated: Secondary | ICD-10-CM | POA: Insufficient documentation

## 2023-01-08 DIAGNOSIS — Z933 Colostomy status: Secondary | ICD-10-CM | POA: Diagnosis not present

## 2023-01-08 DIAGNOSIS — F430 Acute stress reaction: Secondary | ICD-10-CM

## 2023-01-08 DIAGNOSIS — F319 Bipolar disorder, unspecified: Secondary | ICD-10-CM | POA: Diagnosis present

## 2023-01-08 DIAGNOSIS — D649 Anemia, unspecified: Secondary | ICD-10-CM | POA: Insufficient documentation

## 2023-01-08 DIAGNOSIS — Z720 Tobacco use: Secondary | ICD-10-CM | POA: Diagnosis present

## 2023-01-08 DIAGNOSIS — Z9049 Acquired absence of other specified parts of digestive tract: Secondary | ICD-10-CM

## 2023-01-08 DIAGNOSIS — Y907 Blood alcohol level of 200-239 mg/100 ml: Secondary | ICD-10-CM | POA: Insufficient documentation

## 2023-01-08 DIAGNOSIS — F10129 Alcohol abuse with intoxication, unspecified: Secondary | ICD-10-CM | POA: Diagnosis not present

## 2023-01-08 DIAGNOSIS — F17210 Nicotine dependence, cigarettes, uncomplicated: Secondary | ICD-10-CM | POA: Insufficient documentation

## 2023-01-08 DIAGNOSIS — E162 Hypoglycemia, unspecified: Secondary | ICD-10-CM | POA: Diagnosis present

## 2023-01-08 DIAGNOSIS — Z5986 Financial insecurity: Secondary | ICD-10-CM

## 2023-01-08 DIAGNOSIS — K219 Gastro-esophageal reflux disease without esophagitis: Secondary | ICD-10-CM | POA: Diagnosis present

## 2023-01-08 DIAGNOSIS — F419 Anxiety disorder, unspecified: Secondary | ICD-10-CM | POA: Diagnosis present

## 2023-01-08 DIAGNOSIS — F410 Panic disorder [episodic paroxysmal anxiety] without agoraphobia: Secondary | ICD-10-CM

## 2023-01-08 DIAGNOSIS — H409 Unspecified glaucoma: Secondary | ICD-10-CM | POA: Diagnosis present

## 2023-01-08 DIAGNOSIS — F10920 Alcohol use, unspecified with intoxication, uncomplicated: Secondary | ICD-10-CM

## 2023-01-08 DIAGNOSIS — Z560 Unemployment, unspecified: Secondary | ICD-10-CM

## 2023-01-08 DIAGNOSIS — Z811 Family history of alcohol abuse and dependence: Secondary | ICD-10-CM | POA: Diagnosis not present

## 2023-01-08 DIAGNOSIS — J439 Emphysema, unspecified: Secondary | ICD-10-CM | POA: Diagnosis present

## 2023-01-08 DIAGNOSIS — E7800 Pure hypercholesterolemia, unspecified: Secondary | ICD-10-CM | POA: Diagnosis not present

## 2023-01-08 DIAGNOSIS — F339 Major depressive disorder, recurrent, unspecified: Secondary | ICD-10-CM

## 2023-01-08 DIAGNOSIS — G894 Chronic pain syndrome: Secondary | ICD-10-CM | POA: Diagnosis present

## 2023-01-08 DIAGNOSIS — J449 Chronic obstructive pulmonary disease, unspecified: Secondary | ICD-10-CM | POA: Insufficient documentation

## 2023-01-08 DIAGNOSIS — K5900 Constipation, unspecified: Secondary | ICD-10-CM | POA: Diagnosis not present

## 2023-01-08 DIAGNOSIS — R45850 Homicidal ideations: Secondary | ICD-10-CM | POA: Insufficient documentation

## 2023-01-08 DIAGNOSIS — Z7951 Long term (current) use of inhaled steroids: Secondary | ICD-10-CM

## 2023-01-08 DIAGNOSIS — Z8249 Family history of ischemic heart disease and other diseases of the circulatory system: Secondary | ICD-10-CM | POA: Diagnosis not present

## 2023-01-08 DIAGNOSIS — F1094 Alcohol use, unspecified with alcohol-induced mood disorder: Secondary | ICD-10-CM

## 2023-01-08 LAB — COMPREHENSIVE METABOLIC PANEL
ALT: 15 U/L (ref 0–44)
AST: 22 U/L (ref 15–41)
Albumin: 3.3 g/dL — ABNORMAL LOW (ref 3.5–5.0)
Alkaline Phosphatase: 94 U/L (ref 38–126)
Anion gap: 13 (ref 5–15)
BUN: 11 mg/dL (ref 6–20)
CO2: 23 mmol/L (ref 22–32)
Calcium: 8.7 mg/dL — ABNORMAL LOW (ref 8.9–10.3)
Chloride: 104 mmol/L (ref 98–111)
Creatinine, Ser: 0.74 mg/dL (ref 0.61–1.24)
GFR, Estimated: >60 mL/min (ref >60)
Glucose, Bld: 97 mg/dL (ref 70–99)
Potassium: 3.9 mmol/L (ref 3.5–5.1)
Sodium: 140 mmol/L (ref 135–145)
Total Bilirubin: 0.1 mg/dL — ABNORMAL LOW (ref 0.3–1.2)
Total Protein: 7.4 g/dL (ref 6.5–8.1)

## 2023-01-08 LAB — RAPID URINE DRUG SCREEN, HOSP PERFORMED
Amphetamines: NOT DETECTED
Barbiturates: NOT DETECTED
Benzodiazepines: NOT DETECTED
Cocaine: NOT DETECTED
Opiates: NOT DETECTED
Tetrahydrocannabinol: NOT DETECTED

## 2023-01-08 LAB — CBC
HCT: 40.4 % (ref 39.0–52.0)
Hemoglobin: 12.6 g/dL — ABNORMAL LOW (ref 13.0–17.0)
MCH: 28.5 pg (ref 26.0–34.0)
MCHC: 31.2 g/dL (ref 30.0–36.0)
MCV: 91.4 fL (ref 80.0–100.0)
Platelets: 456 10*3/uL — ABNORMAL HIGH (ref 150–400)
RBC: 4.42 MIL/uL (ref 4.22–5.81)
RDW: 16.3 % — ABNORMAL HIGH (ref 11.5–15.5)
WBC: 6.9 10*3/uL (ref 4.0–10.5)
nRBC: 0 % (ref 0.0–0.2)

## 2023-01-08 LAB — ETHANOL: Alcohol, Ethyl (B): 206 mg/dL — ABNORMAL HIGH (ref <10)

## 2023-01-08 LAB — ACETAMINOPHEN LEVEL: Acetaminophen (Tylenol), Serum: <10 ug/mL — ABNORMAL LOW (ref 10–30)

## 2023-01-08 LAB — SALICYLATE LEVEL: Salicylate Lvl: <7 mg/dL — ABNORMAL LOW (ref 7.0–30.0)

## 2023-01-08 MED ORDER — UMECLIDINIUM-VILANTEROL 62.5-25 MCG/ACT IN AEPB
1.0000 | INHALATION_SPRAY | Freq: Every day | RESPIRATORY_TRACT | Status: DC
  Administered 2023-01-09 – 2023-01-12 (×4): 1 via RESPIRATORY_TRACT
  Filled 2023-01-08: qty 14

## 2023-01-08 MED ORDER — POLYETHYLENE GLYCOL 3350 17 GM/SCOOP PO POWD: 17.0000 g | Freq: Every day | ORAL | Status: DC | PRN

## 2023-01-08 MED ORDER — ALBUTEROL SULFATE HFA 108 (90 BASE) MCG/ACT IN AERS: 2.0000 | INHALATION_SPRAY | Freq: Four times a day (QID) | RESPIRATORY_TRACT | Status: DC | PRN

## 2023-01-08 MED ORDER — GABAPENTIN 100 MG PO CAPS
100.0000 mg | ORAL_CAPSULE | Freq: Three times a day (TID) | ORAL | Status: DC
  Filled 2023-01-08 (×2): qty 1

## 2023-01-08 MED ORDER — BUSPIRONE HCL 15 MG PO TABS
15.0000 mg | ORAL_TABLET | Freq: Every morning | ORAL | Status: DC
  Administered 2023-01-09 – 2023-01-12 (×4): 15 mg via ORAL
  Filled 2023-01-08 (×6): qty 1

## 2023-01-08 MED ORDER — ADULT MULTIVITAMIN W/MINERALS CH
1.0000 | ORAL_TABLET | Freq: Every day | ORAL | Status: DC
  Administered 2023-01-08 – 2023-01-12 (×5): 1 via ORAL
  Filled 2023-01-08 (×7): qty 1

## 2023-01-08 MED ORDER — GABAPENTIN 100 MG PO CAPS
200.0000 mg | ORAL_CAPSULE | Freq: Three times a day (TID) | ORAL | Status: DC
  Administered 2023-01-08 – 2023-01-12 (×12): 200 mg via ORAL
  Filled 2023-01-08 (×16): qty 2

## 2023-01-08 MED ORDER — LORAZEPAM 1 MG PO TABS
1.0000 mg | ORAL_TABLET | Freq: Every day | ORAL | Status: AC
  Administered 2023-01-12: 1 mg via ORAL
  Filled 2023-01-08: qty 1

## 2023-01-08 MED ORDER — LOSARTAN POTASSIUM 50 MG PO TABS
100.0000 mg | ORAL_TABLET | Freq: Every day | ORAL | Status: DC
  Administered 2023-01-08: 100 mg via ORAL
  Filled 2023-01-08: qty 2

## 2023-01-08 MED ORDER — LORAZEPAM 1 MG PO TABS
1.0000 mg | ORAL_TABLET | Freq: Three times a day (TID) | ORAL | Status: AC
  Administered 2023-01-10 (×3): 1 mg via ORAL
  Filled 2023-01-08 (×2): qty 1

## 2023-01-08 MED ORDER — MAGNESIUM HYDROXIDE 400 MG/5ML PO SUSP: 30.0000 mL | Freq: Every day | ORAL | Status: DC | PRN

## 2023-01-08 MED ORDER — LORAZEPAM 1 MG PO TABS
1.0000 mg | ORAL_TABLET | Freq: Four times a day (QID) | ORAL | Status: AC | PRN
  Filled 2023-01-08: qty 1

## 2023-01-08 MED ORDER — ALUM & MAG HYDROXIDE-SIMETH 200-200-20 MG/5ML PO SUSP: 30.0000 mL | ORAL | Status: DC | PRN

## 2023-01-08 MED ORDER — THIAMINE HCL 100 MG/ML IJ SOLN
100.0000 mg | Freq: Once | INTRAMUSCULAR | Status: AC
  Administered 2023-01-08: 100 mg via INTRAMUSCULAR
  Filled 2023-01-08: qty 2

## 2023-01-08 MED ORDER — LORAZEPAM 1 MG PO TABS
1.0000 mg | ORAL_TABLET | Freq: Two times a day (BID) | ORAL | Status: AC
  Administered 2023-01-11 (×2): 1 mg via ORAL
  Filled 2023-01-08 (×2): qty 1

## 2023-01-08 MED ORDER — SERTRALINE HCL 100 MG PO TABS
100.0000 mg | ORAL_TABLET | Freq: Every day | ORAL | Status: DC
  Administered 2023-01-09 – 2023-01-12 (×4): 100 mg via ORAL
  Filled 2023-01-08 (×5): qty 1

## 2023-01-08 MED ORDER — FERROUS GLUCONATE 324 (38 FE) MG PO TABS
324.0000 mg | ORAL_TABLET | ORAL | Status: DC
  Filled 2023-01-08: qty 1

## 2023-01-08 MED ORDER — METHOCARBAMOL 500 MG PO TABS: 500.0000 mg | ORAL_TABLET | Freq: Three times a day (TID) | ORAL | Status: DC | PRN

## 2023-01-08 MED ORDER — LORAZEPAM 1 MG PO TABS
1.0000 mg | ORAL_TABLET | Freq: Four times a day (QID) | ORAL | Status: AC
  Administered 2023-01-08 – 2023-01-09 (×5): 1 mg via ORAL
  Filled 2023-01-08 (×5): qty 1

## 2023-01-08 MED ORDER — LORAZEPAM 2 MG/ML IJ SOLN
2.0000 mg | Freq: Once | INTRAMUSCULAR | Status: AC
  Administered 2023-01-08: 2 mg via INTRAMUSCULAR
  Filled 2023-01-08: qty 1

## 2023-01-08 MED ORDER — VITAMIN B-1 100 MG PO TABS
100.0000 mg | ORAL_TABLET | Freq: Every day | ORAL | Status: DC
  Administered 2023-01-09 – 2023-01-12 (×4): 100 mg via ORAL
  Filled 2023-01-08 (×5): qty 1

## 2023-01-08 MED ORDER — CLONIDINE HCL 0.1 MG PO TABS
0.1000 mg | ORAL_TABLET | Freq: Once | ORAL | Status: AC
  Administered 2023-01-08: 0.1 mg via ORAL
  Filled 2023-01-08 (×2): qty 1

## 2023-01-08 MED ORDER — LOPERAMIDE HCL 2 MG PO CAPS: 2.0000 mg | ORAL_CAPSULE | ORAL | Status: AC | PRN

## 2023-01-08 MED ORDER — VITAMIN B-12 1000 MCG PO TABS: 1000.0000 ug | ORAL_TABLET | Freq: Every day | ORAL | Status: DC

## 2023-01-08 MED ORDER — GABAPENTIN 100 MG PO CAPS: 200.0000 mg | ORAL_CAPSULE | Freq: Two times a day (BID) | ORAL | Status: DC

## 2023-01-08 MED ORDER — PEG 3350 17 G PO PACK: 17.0000 g | PACK | Freq: Every day | ORAL | Status: DC | PRN

## 2023-01-08 MED ORDER — CLONIDINE HCL 0.1 MG PO TABS
0.1000 mg | ORAL_TABLET | Freq: Two times a day (BID) | ORAL | Status: DC | PRN
  Administered 2023-01-08: 0.1 mg via ORAL
  Filled 2023-01-08 (×2): qty 1

## 2023-01-08 MED ORDER — BUSPIRONE HCL 10 MG PO TABS: 15.0000 mg | ORAL_TABLET | Freq: Every morning | ORAL | Status: DC

## 2023-01-08 MED ORDER — ONDANSETRON 4 MG PO TBDP: 4.0000 mg | ORAL_TABLET | Freq: Four times a day (QID) | ORAL | Status: AC | PRN

## 2023-01-08 MED ORDER — HYDROXYZINE HCL 25 MG PO TABS
25.0000 mg | ORAL_TABLET | Freq: Four times a day (QID) | ORAL | Status: AC | PRN
  Filled 2023-01-08: qty 1

## 2023-01-08 MED ORDER — LOSARTAN POTASSIUM 50 MG PO TABS
100.0000 mg | ORAL_TABLET | Freq: Every day | ORAL | Status: DC
  Administered 2023-01-09 – 2023-01-12 (×4): 100 mg via ORAL
  Filled 2023-01-08 (×5): qty 2

## 2023-01-08 MED ORDER — B-12 1000 MCG SL SUBL: 1000.0000 ug | SUBLINGUAL_TABLET | Freq: Every day | SUBLINGUAL | Status: DC

## 2023-01-08 MED ORDER — UMECLIDINIUM-VILANTEROL 62.5-25 MCG/ACT IN AEPB
1.0000 | INHALATION_SPRAY | Freq: Every day | RESPIRATORY_TRACT | Status: DC
  Filled 2023-01-08: qty 14

## 2023-01-08 MED ORDER — GABAPENTIN 100 MG PO CAPS
100.0000 mg | ORAL_CAPSULE | Freq: Three times a day (TID) | ORAL | Status: DC
  Administered 2023-01-08: 100 mg via ORAL
  Filled 2023-01-08: qty 1

## 2023-01-08 MED ORDER — SERTRALINE HCL 100 MG PO TABS
100.0000 mg | ORAL_TABLET | Freq: Every day | ORAL | Status: DC
  Administered 2023-01-08: 100 mg via ORAL
  Filled 2023-01-08: qty 1

## 2023-01-08 MED ORDER — VITAMIN B-12 1000 MCG PO TABS
1000.0000 ug | ORAL_TABLET | Freq: Every day | ORAL | Status: DC
  Administered 2023-01-09 – 2023-01-12 (×4): 1000 ug via ORAL
  Filled 2023-01-08 (×5): qty 1

## 2023-01-08 NOTE — ED Notes (Signed)
Update family [phone]

## 2023-01-08 NOTE — ED Notes (Signed)
Family called again for update. Same number as below.

## 2023-01-08 NOTE — BH Assessment (Addendum)
Disposition Note Update: Per [name] patient in need of a private room due to medical. Supplies can be provided from home. Patient assigned to bed 404-1.   Disposition Note:  [name], NP recommends inpatient psychiatric admission. The [name], RN) will review for a [facility] bed.

## 2023-01-08 NOTE — ED Notes (Signed)
IVC Papers handed off the the [facility] nurse.

## 2023-01-08 NOTE — ED Notes (Signed)
Patient states he is finished speaking to telehealth person

## 2023-01-08 NOTE — ED Notes (Signed)
Assumed care of patient who is ivc'd and pending psych evaluation. Patient upset he has not been allowed to go home. Patient ambulates with steady gait. Patient has colostomy bag he cares for himself as well as dressing his own abdominal wound. Patient a/o x 4 respirations even and non labored
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
## 2023-01-08 NOTE — ED Notes (Signed)
Pt changing his ostomy bag in room.

## 2023-01-08 NOTE — ED Notes (Signed)
Pt emptying ostomy bag in bathroom. Pt is becoming agitated prior to going into bathroom saying he's just going to leave. Writer informed patient that he is currently involuntarily committed and cannot leave and that if he tries to we would have to call security. Writer requested that patient wait for psychiatry to call and evaluated as that is his best chance of being able to leave tonight.

## 2023-01-08 NOTE — ED Notes (Signed)
Spoke with pt daughter Jazir Lichtenwalner @ number provided in chart. Family updated on plan of care.

## 2023-01-08 NOTE — ED Provider Notes (Signed)
BH team/[facility], indicates pt accepted to [facility]  Pt calm, alert, cooperative. Vitals normal. No tremor or shakes.   Pt currently appears stable for transport to [facility].     Cathren Laine, MD 01/08/23 1351

## 2023-01-08 NOTE — BHH Group Notes (Signed)
Adult Psychoeducational Group Note  Date:  01/08/2023 Time:  9:56 PM  Group Topic/Focus:  Wrap-Up Group:   The focus of this group is to help patients review their daily goal of treatment and discuss progress on daily workbooks.  Additional Comments:  Pt did not attend the evening group.  Christ Kick 01/08/2023, 9:56 PM

## 2023-01-08 NOTE — Tx Team (Signed)
Initial Treatment Plan 01/08/2023 6:24 PM Audley Hose ZOX:[id number]    PATIENT STRESSORS: Financial difficulties   Loss of mother and father     PATIENT STRENGTHS: Capable of independent living  Supportive family/friends    PATIENT IDENTIFIED PROBLEMS: Depression from loss of parents  Financial concerns related to income                   DISCHARGE CRITERIA:  Ability to meet basic life and health needs Improved stabilization in mood, thinking, and/or behavior Withdrawal symptoms are absent or subacute and managed without 24-hour nursing intervention  PRELIMINARY DISCHARGE PLAN: Return to previous work or school arrangements  PATIENT/FAMILY INVOLVEMENT: This treatment plan has been presented to and reviewed with the patient, Timothy [name], and/or family member.  The patient and family have been given the opportunity to ask questions and make suggestions.  Mathews Argyle, RN 01/08/2023, 6:24 PM

## 2023-01-08 NOTE — ED Notes (Signed)
IVC EXP. 01/14/23
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
## 2023-01-08 NOTE — ED Notes (Signed)
Pt given guaze and abd pad to dress abd wound

## 2023-01-08 NOTE — BHH Group Notes (Signed)
Spiritual care group facilitated by Chaplain Katy Yee Joss, BCC  Group focused on topic of strength. Group members reflected on what thoughts and feelings emerge when they hear this topic. They then engaged in facilitated dialog around how strength is present in their lives. This dialog focused on representing what strength had been to them in their lives (images and patterns given) and what they saw as helpful in their life now (what they needed / wanted).  Activity drew on narrative framework.  Patient Progress: Did not attend.  

## 2023-01-08 NOTE — ED Notes (Addendum)
It was noted that patient was under IVC. Upon going to verify paperwork pertaining to the IVC I was informed that the paperwork was incorrect as the patient's name was spelled incorrectly. Nursing was never able to verify the IVC paperwork as it was never been presented to them and remained with the Sheriffs on site. [employer] Deputies then requested that patient be returned to them explaining that they would prefer to transport the patient to the ED for evaluation after patient had disclosed to them underlying medical problems. Patient had been advised of the request/concern of the deputies and agreed that he preferred to be transported to the ED for evaluation of issues with his colostomy bag site, and he also stated that he had post surgical wounds that were not healing that needed to be evaluated there(patient had presented to the ED previously and had left prior to being seen/evaluated.) NP [name] was advised of scenario advising that Patient be escorted back to the custody of [employer] Sheriff to be transported to the ED for further evaluation. Patient departed the premises with [facility] deputies willingly.
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
## 2023-01-08 NOTE — ED Notes (Signed)
TTS machine set up at bedside for patient evaluation. Patient awake alert and willing to complete evaluation

## 2023-01-08 NOTE — ED Provider Notes (Signed)
Writer notified: ED that patient was brought to [facility] by [employer] however [employer] changed her mind and stated they were going to bring the patient to: ED because the patient has a colostomy bag that was leaking and possibly was bloody.  Patient was not actually seen by writer however Clinical research associate notified the ED so that they would know that the patient was coming also completed and an Arts development officer
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
## 2023-01-08 NOTE — ED Notes (Signed)
Pt refusing to wear telemetry monitoring and is attempting to leave at this time. Security called by Clinical research associate.

## 2023-01-08 NOTE — ED Notes (Signed)
Patient came in IVC'd. First exam completed by EDP.

## 2023-01-09 MED ORDER — HALOPERIDOL LACTATE 5 MG/ML IJ SOLN: 5.0000 mg | Freq: Three times a day (TID) | INTRAMUSCULAR | Status: DC | PRN

## 2023-01-09 MED ORDER — HYDROXYZINE HCL 25 MG PO TABS
25.0000 mg | ORAL_TABLET | Freq: Three times a day (TID) | ORAL | Status: DC | PRN
  Administered 2023-01-11: 25 mg via ORAL

## 2023-01-09 MED ORDER — TRAZODONE HCL 50 MG PO TABS
50.0000 mg | ORAL_TABLET | Freq: Every day | ORAL | Status: DC
  Administered 2023-01-09 – 2023-01-11 (×2): 50 mg via ORAL
  Filled 2023-01-09 (×5): qty 1

## 2023-01-09 MED ORDER — LORAZEPAM 1 MG PO TABS
2.0000 mg | ORAL_TABLET | Freq: Three times a day (TID) | ORAL | Status: DC | PRN
  Administered 2023-01-11: 2 mg via ORAL

## 2023-01-09 MED ORDER — DIPHENHYDRAMINE HCL 50 MG/ML IJ SOLN: 50.0000 mg | Freq: Four times a day (QID) | INTRAMUSCULAR | Status: DC | PRN

## 2023-01-09 MED ORDER — HALOPERIDOL 5 MG PO TABS: 5.0000 mg | ORAL_TABLET | Freq: Three times a day (TID) | ORAL | Status: DC | PRN

## 2023-01-09 MED ORDER — LORAZEPAM 1 MG PO TABS
2.0000 mg | ORAL_TABLET | Freq: Three times a day (TID) | ORAL | Status: DC | PRN
  Filled 2023-01-09: qty 2

## 2023-01-09 MED ORDER — DIPHENHYDRAMINE HCL 25 MG PO CAPS: 50.0000 mg | ORAL_CAPSULE | Freq: Three times a day (TID) | ORAL | Status: DC | PRN
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
## 2023-01-09 NOTE — Group Note (Signed)
Date:  01/09/2023 Time:  12:08 PM  Group Topic/Focus:  Identifying Needs:   The focus of this group is to help patients identify their personal needs that have been historically problematic and identify healthy behaviors to address their needs. Overcoming Stress:   The focus of this group is to define stress and help patients assess their triggers.    Participation Level:  Did Not Attend  Participation Quality:    Affect:    Cognitive:    Insight:   Engagement in Group:    Modes of Intervention:    Additional Comments:    Rheda Kassab M Kyrielle Urbanski 01/09/2023, 12:08 PM  

## 2023-01-09 NOTE — Progress Notes (Signed)
   01/09/23 0923  Psych Admission Type (Psych Patients Only)  Admission Status Involuntary  Psychosocial Assessment  Patient Complaints Nervousness  Eye Contact Fair  Facial Expression Animated  Affect Appropriate to circumstance  Speech Logical/coherent  Interaction Assertive  Motor Activity Other (Comment) (unremarkable)  Appearance/Hygiene In scrubs  Behavior Characteristics Cooperative  Mood Pleasant  Thought Process  Coherency WDL  Content WDL  Delusions None reported or observed  Perception WDL  Hallucination None reported or observed  Judgment Poor  Confusion None  Danger to Self  Current suicidal ideation? Denies  Agreement Not to Harm Self Yes  Description of Agreement verbal  Danger to Others  Danger to Others None reported or observed

## 2023-01-09 NOTE — H&P (Signed)
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 time.  Psychiatric:        Behavior: Behavior normal.    Review of Systems  Constitutional:  Negative for chills and fever.  HENT:  Negative for sore throat.   Eyes:  Negative for blurred vision.  Respiratory:  Negative for cough and shortness of breath.   Cardiovascular:  Negative for chest pain and palpitations.  Gastrointestinal:  Negative for heartburn, nausea and vomiting.       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  Musculoskeletal: Negative.   Skin:  Negative for itching and rash.  Neurological:  Negative for dizziness, tingling and headaches.  Endo/Heme/Allergies:        See allergy listing  Psychiatric/Behavioral:  Positive for depression and substance abuse. The  patient is nervous/anxious and has insomnia.    Blood pressure (!) 124/91, pulse 97, temperature 98.1 F (36.7 C), temperature source Oral, resp. rate 18, height 5\' 9"  (1.753 m), weight 68 kg, SpO2 100 %. Body mass index is 22.15 kg/m.  Treatment Plan Summary: Daily contact with patient to assess and evaluate symptoms and progress in treatment and Medication management  Physician Treatment Plan for Primary Diagnosis:  Assessment:` Substance induced mood disorder (HCC) Bipolar disorder  Plans: Medications: Continue sertraline Zoloft tablet 100 mg p.o. daily for depression Continue BuSpar tablet 15 mg p.o. daily for anxiety Initiate hydroxyzine tablets 25 mg p.o. 3 times daily as needed for anxiety Initiate trazodone 50  mg p.o. nightly as needed for insomnia  Ativan (lorazepam) detox protocol: See MAR  Medication for other medical conditions: Continue clonidine Tablet 0.1 mg p.o. twice daily as needed for high blood pressure Cyanocobalamin vitamin B12 tablets 1000 mcg p.o. daily for supplement Gabapentin Neurontin capsule 200 mg p.o. 3 times daily for anxiety Losartan (Cozaar) tablets 100 mg p.o. daily for hypertension Robaxin tablets 500 mg p.o. every 8 hours as needed muscle spasm PEG 335 0 pack 17 g p.o. daily as needed mild constipation Umeclidinium-vilanterol (ANORO Ellipta) 62.5-25 1 puff inhalation daily for COPD  Agitation protocol: Benadryl capsule 50 mg p.o. or IM 3 times daily as needed agitation   Haldol tablets 5 mg po IM 3 times daily as needed agitation   Lorazepam tablet 2 mg p.o. or IM 3 times daily as needed agitation    Other PRN Medications -Acetaminophen 650 mg every 6 as needed/mild pain -Maalox 30 mL oral every 4 as needed/digestion -Magnesium hydroxide 30 mL daily as needed/mild constipation  -- The risks/benefits/side-effects/alternatives to this medication were discussed in detail with the patient and time was given for questions. The patient consents  to medication trial.              -- Encouraged patient to participate  in unit milieu and in scheduled group therapies    Safety and Monitoring: Voluntary admission to inpatient psychiatric unit for safety, stabilization and treatment Daily contact with patient to assess and evaluate symptoms and progress in treatment Patient's case to be discussed in multi-disciplinary team meeting Observation Level : q15 minute checks Vital signs: q12 hours Precautions: suicide, but pt currently verbally contracts for safety on unit    Discharge Planning: Social work and case management to assist with discharge planning and identification of hospital follow-up needs prior to discharge Estimated LOS: 5-7 days Discharge Concerns: Need to establish a safety plan; Medication compliance and effectiveness Discharge Goals: Return home with outpatient referrals for mental health follow-up including medication management/psychotherapy.    Long Term Goal(s): Improvement in symptoms so as ready for discharge  Short Term Goals: Ability to identify changes in lifestyle to reduce recurrence of condition will improve, Ability to verbalize feelings will improve, Ability to disclose and discuss suicidal ideas, Ability to demonstrate self-control will improve, Ability to identify and develop effective coping behaviors will improve, Ability to maintain clinical measurements within normal limits will improve, Compliance with prescribed medications will improve, and Ability to identify triggers associated with substance abuse/mental health issues will improve  Physician Treatment Plan for Secondary Diagnosis: Principal Problem:   Substance induced mood disorder (HCC)  I certify that inpatient services furnished can reasonably be expected to improve the patient's condition.    Cecilie Lowers, FNP 6/20/20244:56 PM

## 2023-01-09 NOTE — Progress Notes (Signed)
   01/09/23 0923  Charting Type  Charting Type Shift assessment  Safety Check Verification  Has the RN verified the 15 minute safety check completion? Yes  Neurological  Neuro (WDL) WDL  HEENT  HEENT (WDL) WDL  Respiratory  Respiratory (WDL) X (hx COPD)  Cardiac  Cardiac (WDL) X (HTN)  Vascular  Vascular (WDL) WDL  Integumentary  Integumentary (WDL) X (refer to PTA)  Braden Scale (Ages 8 and up)  Sensory Perceptions 4  Moisture 2  Activity 4  Mobility 3  Nutrition 3  Friction and Shear 3  Braden Scale Score 19  Musculoskeletal  Musculoskeletal (WDL) WDL  Assistive Device None  Gastrointestinal  Gastrointestinal (WDL) X  Abdomen Inspection Ostomy  GU Assessment  Genitourinary (WDL) WDL  Wound/Incision (LDAs)  Type of Wound/Incision (LDA) Incision  Neurological  Level of Consciousness Alert

## 2023-01-09 NOTE — Progress Notes (Signed)
   01/08/23 2200  Psych Admission Type (Psych Patients Only)  Admission Status Involuntary  Psychosocial Assessment  Patient Complaints Insomnia  Eye Contact Brief  Facial Expression Flat  Affect Appropriate to circumstance  Speech Logical/coherent  Interaction Guarded;Minimal  Motor Activity Fidgety;Unsteady  Appearance/Hygiene In scrubs  Behavior Characteristics Cooperative;Calm  Mood Pleasant  Thought Process  Coherency WDL  Content WDL  Delusions None reported or observed  Perception WDL  Hallucination None reported or observed  Judgment Impaired  Confusion None  Danger to Self  Current suicidal ideation? Denies  Agreement Not to Harm Self Yes  Description of Agreement Verbal contract for safety  Danger to Others  Danger to Others None reported or observed   Pt was offered support and encouragement. Pt was given scheduled medications. Given PRN Clonidine per MAR. Pt was encouraged to attend groups. Q 15 minute checks were done for safety. Pt did not attend group, remained asleep in room. Minimally interactive with peers and staff. Pt has no complaints. Pt receptive to treatment and safety maintained on unit.

## 2023-01-09 NOTE — Progress Notes (Signed)
   01/09/23 0600  15 Minute Checks  Location Bedroom  Visual Appearance Calm  Behavior Sleeping  Sleep (Behavioral Health Patients Only)  Calculate sleep? (Click Yes once per 24 hr at 0600 safety check) Yes  Documented sleep last 24 hours 10.75

## 2023-01-10 ENCOUNTER — Telehealth: Payer: Self-pay | Admitting: Student

## 2023-01-10 ENCOUNTER — Encounter (HOSPITAL_COMMUNITY): Payer: Self-pay

## 2023-01-10 DIAGNOSIS — F1090 Alcohol use, unspecified, uncomplicated: Secondary | ICD-10-CM | POA: Diagnosis present

## 2023-01-10 DIAGNOSIS — F1994 Other psychoactive substance use, unspecified with psychoactive substance-induced mood disorder: Secondary | ICD-10-CM | POA: Diagnosis not present

## 2023-01-10 MED ORDER — LORAZEPAM 1 MG PO TABS
1.0000 mg | ORAL_TABLET | Freq: Once | ORAL | Status: AC
  Administered 2023-01-10: 1 mg via ORAL
  Filled 2023-01-10: qty 1

## 2023-01-10 NOTE — Progress Notes (Signed)
   01/10/23 0272  15 Minute Checks  Location Bedroom  Visual Appearance Calm  Behavior Sleeping  Sleep (Behavioral Health Patients Only)  Calculate sleep? (Click Yes once per 24 hr at 0600 safety check) Yes  Documented sleep last 24 hours 10.5

## 2023-01-10 NOTE — Group Note (Unsigned)
Date:  01/10/2023 Time:  1:40 PM  Group Topic/Focus:  Goals Group:   The focus of this group is to help patients establish daily goals to achieve during treatment and discuss how the patient can incorporate goal setting into their daily lives to aide in recovery.     Participation Level:  {BHH PARTICIPATION DGLOV:56433}  Participation Quality:  {BHH PARTICIPATION QUALITY:22265}  Affect:  {BHH AFFECT:22266}  Cognitive:  {BHH COGNITIVE:22267}  Insight: {BHH Insight2:20797}  Engagement in Group:  {BHH ENGAGEMENT IN IRJJO:84166}  Modes of Intervention:  {BHH MODES OF INTERVENTION:22269}  Additional Comments:  ***  [name] 01/10/2023, 1:40 PM

## 2023-01-10 NOTE — BH IP Treatment Plan (Signed)
Interdisciplinary Treatment and Diagnostic Plan   01/10/2023 Time of Session: 1125 Timothy [name] MRN: [id number]  Principal Diagnosis: Substance induced mood disorder (HCC)  Secondary Diagnoses: Principal Problem:   Substance induced mood disorder (HCC)   Current Medications:  Current Facility-Administered Medications  Medication Dose Route Frequency Provider Last Rate Last Admin   albuterol (VENTOLIN HFA) 108 (90 Base) MCG/ACT inhaler 2 puff  2 puff Inhalation Q6H PRN Massengill, Harrold Donath, MD       alum & mag hydroxide-simeth (MAALOX/MYLANTA) 200-200-20 MG/5ML suspension 30 mL  30 mL Oral Q4H PRN Massengill, Harrold Donath, MD       busPIRone (BUSPAR) tablet 15 mg  15 mg Oral q morning Massengill, Harrold Donath, MD   15 mg at 01/10/23 4098   cloNIDine (CATAPRES) tablet 0.1 mg  0.1 mg Oral BID PRN Starleen Blue, NP   0.1 mg at 01/08/23 2200   cyanocobalamin (VITAMIN B12) tablet 1,000 mcg  1,000 mcg Oral Daily Massengill, Harrold Donath, MD   1,000 mcg at 01/10/23 1191   diphenhydrAMINE (BENADRYL) capsule 50 mg  50 mg Oral Q8H PRN Ntuen, Jesusita Oka, FNP       Or   diphenhydrAMINE (BENADRYL) injection 50 mg  50 mg Intramuscular Q6H PRN Ntuen, Jesusita Oka, FNP       gabapentin (NEURONTIN) capsule 200 mg  200 mg Oral TID Starleen Blue, NP   200 mg at 01/10/23 1258   haloperidol (HALDOL) tablet 5 mg  5 mg Oral Q8H PRN Ntuen, Jesusita Oka, FNP       Or   haloperidol lactate (HALDOL) injection 5 mg  5 mg Intramuscular Q8H PRN Ntuen, Jesusita Oka, FNP       hydrOXYzine (ATARAX) tablet 25 mg  25 mg Oral Q6H PRN Starleen Blue, NP       hydrOXYzine (ATARAX) tablet 25 mg  25 mg Oral TID PRN Ntuen, Jesusita Oka, FNP       loperamide (IMODIUM) capsule 2-4 mg  2-4 mg Oral PRN Nkwenti, Doris, NP       LORazepam (ATIVAN) tablet 1 mg  1 mg Oral Q6H PRN Starleen Blue, NP       LORazepam (ATIVAN) tablet 1 mg  1 mg Oral TID Starleen Blue, NP   1 mg at 01/10/23 1257   Followed by   Melene Muller ON 01/11/2023] LORazepam (ATIVAN) tablet 1 mg  1 mg Oral BID  Starleen Blue, NP       Followed by   Melene Muller ON 01/12/2023] LORazepam (ATIVAN) tablet 1 mg  1 mg Oral Daily Nkwenti, Doris, NP       LORazepam (ATIVAN) tablet 1 mg  1 mg Oral Once Nkwenti, Doris, NP       LORazepam (ATIVAN) tablet 2 mg  2 mg Oral Q8H PRN Ntuen, Jesusita Oka, FNP       Or   LORazepam (ATIVAN) tablet 2 mg  2 mg Oral Q8H PRN Ntuen, Jesusita Oka, FNP       losartan (COZAAR) tablet 100 mg  100 mg Oral Daily Massengill, Nathan, MD   100 mg at 01/10/23 4782   magnesium hydroxide (MILK OF MAGNESIA) suspension 30 mL  30 mL Oral Daily PRN Massengill, Harrold Donath, MD       methocarbamol (ROBAXIN) tablet 500 mg  500 mg Oral Q8H PRN Massengill, Nathan, MD       multivitamin with minerals tablet 1 tablet  1 tablet Oral Daily Starleen Blue, NP   1 tablet at 01/10/23 0829   ondansetron (ZOFRAN-ODT) disintegrating tablet 4  mg  4 mg Oral Q6H PRN Starleen Blue, NP       PEG 3350 PACK 17 g  17 g Oral Daily PRN Massengill, Nathan, MD       sertraline (ZOLOFT) tablet 100 mg  100 mg Oral Daily Ntuen, Tina C, FNP   100 mg at 01/10/23 0827   thiamine (Vitamin B-1) tablet 100 mg  100 mg Oral Daily Starleen Blue, NP   100 mg at 01/10/23 1610   traZODone (DESYREL) tablet 50 mg  50 mg Oral QHS Ntuen, Jesusita Oka, FNP   50 mg at 01/09/23 2130   umeclidinium-vilanterol (ANORO ELLIPTA) 62.5-25 MCG/ACT 1 puff  1 puff Inhalation Daily Massengill, Harrold Donath, MD   1 puff at 01/10/23 0830   PTA Medications: Medications Prior to Admission  Medication Sig Dispense Refill Last Dose   albuterol (VENTOLIN HFA) 108 (90 Base) MCG/ACT inhaler Inhale 2 puffs into the lungs every 6 (six) hours as needed for wheezing or shortness of breath. 8 g 3 01/08/2023   busPIRone (BUSPAR) 15 MG tablet Take 1 tablet (15 mg total) by mouth every morning. 60 tablet 0 01/06/2023   gabapentin (NEURONTIN) 100 MG capsule TAKE 1 CAPSULE BY MOUTH 4 TIMES DAILY AS NEEDED (Patient taking differently: 200 mg daily. TAKE 1 CAPSULE BY MOUTH 4 TIMES DAILY AS NEEDED) 90  capsule 3 01/06/2023   losartan (COZAAR) 100 MG tablet Take 1 tablet (100 mg total) by mouth daily. 30 tablet 0 01/06/2023   pantoprazole (PROTONIX) 40 MG tablet Take 40 mg by mouth daily.   01/06/2023   polyethylene glycol powder (GLYCOLAX/MIRALAX) 17 GM/SCOOP powder Take 17 g by mouth daily as needed. (Patient taking differently: Take 17 g by mouth daily.) 510 g 0 01/06/2023   sertraline (ZOLOFT) 100 MG tablet Take 1 tablet (100 mg total) by mouth daily. 30 tablet 3 01/06/2023   umeclidinium-vilanterol (ANORO ELLIPTA) 62.5-25 MCG/ACT AEPB Inhale 1 puff into the lungs daily. 1 each 6 01/06/2023    Patient Stressors: Financial difficulties   Loss of mother and father    Patient Strengths: Capable of independent living  Supportive family/friends   Treatment Modalities: Medication Management, Group therapy, Case management,  1 to 1 session with clinician, Psychoeducation, Recreational therapy.   Physician Treatment Plan for Primary Diagnosis: Substance induced mood disorder (HCC) Long Term Goal(s): Improvement in symptoms so as ready for discharge   Short Term Goals: Ability to identify changes in lifestyle to reduce recurrence of condition will improve Ability to verbalize feelings will improve Ability to disclose and discuss suicidal ideas Ability to demonstrate self-control will improve Ability to identify and develop effective coping behaviors will improve Ability to maintain clinical measurements within normal limits will improve Compliance with prescribed medications will improve Ability to identify triggers associated with substance abuse/mental health issues will improve  Medication Management: Evaluate patient's response, side effects, and tolerance of medication regimen.  Therapeutic Interventions: 1 to 1 sessions, Unit Group sessions and Medication administration.  Evaluation of Outcomes: Progressing  Physician Treatment Plan for Secondary Diagnosis: Principal Problem:    Substance induced mood disorder (HCC)  Long Term Goal(s): Improvement in symptoms so as ready for discharge   Short Term Goals: Ability to identify changes in lifestyle to reduce recurrence of condition will improve Ability to verbalize feelings will improve Ability to disclose and discuss suicidal ideas Ability to demonstrate self-control will improve Ability to identify and develop effective coping behaviors will improve Ability to maintain clinical measurements within normal limits will improve  Compliance with prescribed medications will improve Ability to identify triggers associated with substance abuse/mental health issues will improve     Medication Management: Evaluate patient's response, side effects, and tolerance of medication regimen.  Therapeutic Interventions: 1 to 1 sessions, Unit Group sessions and Medication administration.  Evaluation of Outcomes: Progressing   RN Treatment Plan for Primary Diagnosis: Substance induced mood disorder (HCC) Long Term Goal(s): Knowledge of disease and therapeutic regimen to maintain health will improve  Short Term Goals: Ability to remain free from injury will improve, Ability to verbalize frustration and anger appropriately will improve, Ability to demonstrate self-control, Ability to participate in decision making will improve, Ability to verbalize feelings will improve, Ability to disclose and discuss suicidal ideas, Ability to identify and develop effective coping behaviors will improve, and Compliance with prescribed medications will improve  Medication Management: RN will administer medications as ordered by provider, will assess and evaluate patient's response and provide education to patient for prescribed medication. RN will report any adverse and/or side effects to prescribing provider.  Therapeutic Interventions: 1 on 1 counseling sessions, Psychoeducation, Medication administration, Evaluate responses to treatment, Monitor vital  signs and CBGs as ordered, Perform/monitor CIWA, COWS, AIMS and Fall Risk screenings as ordered, Perform wound care treatments as ordered.  Evaluation of Outcomes: Progressing   LCSW Treatment Plan for Primary Diagnosis: Substance induced mood disorder (HCC) Long Term Goal(s): Safe transition to appropriate next level of care at discharge, Engage patient in therapeutic group addressing interpersonal concerns.  Short Term Goals: Engage patient in aftercare planning with referrals and resources, Increase social support, Increase ability to appropriately verbalize feelings, Increase emotional regulation, Facilitate acceptance of mental health diagnosis and concerns, Facilitate patient progression through stages of change regarding substance use diagnoses and concerns, Identify triggers associated with mental health/substance abuse issues, and Increase skills for wellness and recovery  Therapeutic Interventions: Assess for all discharge needs, 1 to 1 time with Social worker, Explore available resources and support systems, Assess for adequacy in community support network, Educate family and significant other(s) on suicide prevention, Complete Psychosocial Assessment, Interpersonal group therapy.  Evaluation of Outcomes: Progressing   Progress in Treatment: Attending groups: Yes. Participating in groups: Yes. Taking medication as prescribed: Yes. Toleration medication: Yes. Family/Significant other contact made: Pt declined consents Patient understands diagnosis: Yes. Discussing patient identified problems/goals with staff: Yes. Medical problems stabilized or resolved: Yes. Denies suicidal/homicidal ideation: Yes. Issues/concerns per patient self-inventory: Yes. Other: N/A  New problem(s) identified: No, Describe:  None reported  New Short Term/Long Term Goal(s): medication stabilization, elimination of SI thoughts, development of comprehensive mental wellness plan.   Patient Goals:   Medication Stabilization  Discharge Plan or Barriers: Patient recently admitted. CSW will continue to follow and assess for appropriate referrals and possible discharge planning  Reason for Continuation of Hospitalization: Depression Medication stabilization Suicidal ideation Withdrawal symptoms  Estimated Length of Stay: 3-7 Days  Last 3 [place] Suicide Severity Risk Score: Flowsheet Row [facility] (Current[facility] Most recent reading at 01/08/2023  6:13 PM ED from 01/08/2023 in [facility] Emergency Department at [facility] Most recent reading at 01/08/2023  1:11 AM ED from 01/01/2023 in [facility] Department at [facility] Most recent reading at 01/01/2023  9:54 PM  C-SSRS RISK CATEGORY No Risk No Risk No Risk       Last PHQ 2/9 Scores:    12/18/2022   10:50 AM 11/07/2022    4:00 PM 10/28/2022  1:43 PM  Depression screen PHQ 2/9  Decreased Interest 1 0 1  Down, Depressed, Hopeless 1 0 0  PHQ - 2 Score 2 0 1  Altered sleeping 2  2  Tired, decreased energy 2  1  Change in appetite 1  1  Feeling bad or failure about yourself  0  0  Trouble concentrating 2  2  Moving slowly or fidgety/restless 2  1  Suicidal thoughts 0  0  PHQ-9 Score 11  8  detox, medication management for mood stabilization; elimination of SI thoughts; development of comprehensive mental wellness/sobriety plan     Scribe for Treatment Team: Ane Payment, LCSW 01/10/2023 1:28 PM

## 2023-01-10 NOTE — Group Note (Signed)
Date:  01/10/2023 Time:  1:36 PM  Group Topic/Focus:  Goals Group:   The focus of this group is to help patients establish daily goals to achieve during treatment and discuss how the patient can incorporate goal setting into their daily lives to aide in recovery.    Participation Level:  Did Not Attend  Participation Quality:      Affect:      Cognitive:      Insight: None  Engagement in Group:    Modes of Intervention:      Additional Comments:     [name] 01/10/2023, 1:36 PM
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
## 2023-01-10 NOTE — Progress Notes (Signed)
   01/10/23 1600  Psych Admission Type (Psych Patients Only)  Admission Status Involuntary  Psychosocial Assessment  Patient Complaints None  Eye Contact Fair  Facial Expression Flat  Affect Appropriate to circumstance  Speech Logical/coherent  Interaction Assertive  Motor Activity Fidgety;Unsteady  Appearance/Hygiene In scrubs  Behavior Characteristics Cooperative;Irritable  Mood Irritable  Thought Process  Coherency WDL  Content Blaming others  Delusions None reported or observed  Perception WDL  Hallucination None reported or observed  Judgment Poor  Confusion None  Danger to Self  Current suicidal ideation? Denies  Agreement Not to Harm Self Yes  Description of Agreement verbal contract  Danger to Others  Danger to Others None reported or observed

## 2023-01-10 NOTE — Progress Notes (Deleted)
Discharge Note:  Patient discharged to lobby.   Pt refused cab voucher, Prior to discharge pt stated to staff his sister called "[employer]" to pick him up.   Patient denied SI and HI. Denied A/V hallucinations. Suicide prevention information given and discussed with patient who stated they understood and had no questions. Patient stated they received all their belongings, clothing, toiletries, misc items, etc. Patient stated they appreciated all assistance received from [facility] staff. All required discharge information given to patient. 

## 2023-01-10 NOTE — Group Note (Signed)
Recreation Therapy Group Note   Group Topic:Team Building  Group Date: 01/10/2023 Start Time: 0930 End Time: 1005 Facilitators: Epifania Littrell-McCall, LRT,CTRS Location: [address] Day[address]   Goal Area(s) Addresses:  Patient will effectively work with peer towards shared goal.  Patient will identify skills used to make activity successful.  Patient will identify how skills used during activity can be used to reach post d/c goals.    Group Description: Straw Bridge. In teams of 3-5, patients were given 12 plastic drinking straws and an equal length of masking tape. Using the materials provided, patients were instructed to build a free standing bridge-like structure to suspend an everyday item (ex: puzzle box) off of the floor or table surface. All materials were required to be used by the team in their design. LRT facilitated post-activity discussion reviewing team process. Patients were encouraged to reflect how the skills used in this activity can be generalized to daily life post discharge.    Affect/Mood: N/A   Participation Level: Did not attend    Clinical Observations/Individualized Feedback:     Plan: Continue to engage patient in RT group sessions 2-3x/week.   Kalese Ensz-McCall, LRT,CTRS 01/10/2023 12:15 PM

## 2023-01-10 NOTE — Group Note (Signed)
Date:  01/10/2023 Time:  2:03 PM  Group Topic/Focus:  Wellness Toolbox:   The focus of this group is to discuss various aspects of wellness, balancing those aspects and exploring ways to increase the ability to experience wellness.  Patients will create a wellness toolbox for use upon discharge.    Participation Level:  Minimal  Participation Quality:  Intrusive  Affect:  Appropriate  Cognitive:  Appropriate  Insight: Limited  Engagement in Group:  Limited  Modes of Intervention:  Socialization  Additional Comments:      [name] 01/10/2023, 2:03 PM

## 2023-01-10 NOTE — Telephone Encounter (Signed)
Patient is calling and would like for Dr. Laroy Apple to call him as soon as possible. He is admitted at [facility]. He said he is confused as to why he is committed and is worried about the medications they are giving him.   Please call patient at [phone]

## 2023-01-10 NOTE — Progress Notes (Signed)
   01/09/23 2130  Psych Admission Type (Psych Patients Only)  Admission Status Involuntary  Psychosocial Assessment  Patient Complaints None  Eye Contact Fair  Facial Expression Flat  Affect Appropriate to circumstance  Speech Logical/coherent  Interaction Assertive  Motor Activity Fidgety;Unsteady  Appearance/Hygiene In scrubs  Behavior Characteristics Cooperative;Calm  Mood Pleasant  Thought Process  Coherency WDL  Content WDL  Delusions None reported or observed  Perception WDL  Hallucination None reported or observed  Judgment Impaired  Confusion None  Danger to Self  Current suicidal ideation? Denies  Agreement Not to Harm Self Yes  Description of Agreement Verbal contract for safety  Danger to Others  Danger to Others None reported or observed   Pt reports 0/10 anxiety and depression. Pt is pleasant and cooperative. Pt was offered support and encouragement. Pt was given scheduled medications. Pt was encouraged to attend group. Q 15 minute checks were done for safety. Pt did not attend group. Interacts well with peers and staff. Pt has no complaints.Pt receptive to treatment and safety maintained on unit.

## 2023-01-10 NOTE — [facility] Group Notes (Signed)
Adult Psychoeducational Group Note  Date:  01/10/2023 Time:  9:33 PM  Group Topic/Focus:  Wrap-Up Group:   The focus of this group is to help patients review their daily goal of treatment and discuss progress on daily workbooks.  Participation Level:  Active  Participation Quality:  Appropriate  Affect:  Appropriate  Cognitive:  Appropriate  Insight: Appropriate  Engagement in Group:  Engaged  Modes of Intervention:  Discussion  Additional Comments:  Pt attended the evening AA group.  Christ Kick 01/10/2023, 9:33 PM
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
## 2023-01-11 DIAGNOSIS — F1994 Other psychoactive substance use, unspecified with psychoactive substance-induced mood disorder: Secondary | ICD-10-CM | POA: Diagnosis not present

## 2023-01-11 LAB — LIPID PANEL
Cholesterol: 153 mg/dL (ref 0–200)
HDL: 42 mg/dL (ref >40)
LDL Cholesterol: 96 mg/dL (ref 0–99)
Total CHOL/HDL Ratio: 3.6 RATIO
Triglycerides: 74 mg/dL (ref <150)
VLDL: 15 mg/dL (ref 0–40)

## 2023-01-11 LAB — HEMOGLOBIN A1C
Hgb A1c MFr Bld: 5.4 % (ref 4.8–5.6)
Mean Plasma Glucose: 108.28 mg/dL

## 2023-01-11 MED ORDER — DIVALPROEX SODIUM ER 500 MG PO TB24
500.0000 mg | ORAL_TABLET | Freq: Every day | ORAL | Status: DC
  Administered 2023-01-11: 500 mg via ORAL
  Filled 2023-01-11 (×3): qty 1

## 2023-01-11 NOTE — Progress Notes (Signed)
   01/10/23 2300  Psych Admission Type (Psych Patients Only)  Admission Status Involuntary  Psychosocial Assessment  Patient Complaints Isolation  Eye Contact Fair  Facial Expression Animated  Affect Appropriate to circumstance  Speech Logical/coherent  Interaction Assertive  Motor Activity Slow  Appearance/Hygiene Unremarkable  Behavior Characteristics Cooperative;Appropriate to situation  Mood Pleasant  Thought Process  Coherency WDL  Content WDL  Delusions None reported or observed  Perception WDL  Hallucination None reported or observed  Judgment Poor  Confusion None  Danger to Self  Current suicidal ideation? Denies  Agreement Not to Harm Self Yes  Description of Agreement verbal  Danger to Others  Danger to Others None reported or observed

## 2023-01-11 NOTE — Progress Notes (Signed)
   01/11/23 1409  Psych Admission Type (Psych Patients Only)  Admission Status Involuntary  Psychosocial Assessment  Patient Complaints Other (Comment) (Wants to go home)  Eye Contact Fair  Facial Expression Animated  Affect Appropriate to circumstance  Speech Logical/coherent  Interaction Assertive  Motor Activity Other (Comment) (WNL)  Appearance/Hygiene Unremarkable  Behavior Characteristics Cooperative;Appropriate to situation  Thought Process  Coherency WDL  Content WDL  Delusions None reported or observed  Perception WDL  Hallucination None reported or observed  Judgment Poor  Confusion None  Danger to Self  Current suicidal ideation? Denies  Agreement Not to Harm Self Yes  Description of Agreement verbal  Danger to Others  Danger to Others None reported or observed
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
## 2023-01-11 NOTE — Progress Notes (Signed)
Patient became upset when he was told he would not be discharged today. Prn Ativan given upon patient request.

## 2023-01-11 NOTE — [facility] Group Notes (Signed)
Pt did not attend goals group. 

## 2023-01-12 DIAGNOSIS — F1994 Other psychoactive substance use, unspecified with psychoactive substance-induced mood disorder: Secondary | ICD-10-CM | POA: Diagnosis not present

## 2023-01-12 MED ORDER — DIVALPROEX SODIUM ER 500 MG PO TB24: 500.0000 mg | ORAL_TABLET | Freq: Every day | ORAL | 0 refills | Status: DC

## 2023-01-12 MED ORDER — SERTRALINE HCL 100 MG PO TABS
100.0000 mg | ORAL_TABLET | Freq: Every day | ORAL | 0 refills | Status: DC
Start: 2023-01-12 — End: 2023-03-11

## 2023-01-12 MED ORDER — CYANOCOBALAMIN 1000 MCG PO TABS: 1000.0000 ug | ORAL_TABLET | Freq: Every day | ORAL | 0 refills | Status: DC

## 2023-01-12 MED ORDER — BUSPIRONE HCL 15 MG PO TABS
15.0000 mg | ORAL_TABLET | Freq: Every morning | ORAL | 0 refills | Status: DC
Start: 2023-01-12 — End: 2023-03-11

## 2023-01-12 MED ORDER — LOSARTAN POTASSIUM 100 MG PO TABS
100.0000 mg | ORAL_TABLET | Freq: Every day | ORAL | 0 refills | Status: DC
Start: 2023-01-12 — End: 2023-03-11

## 2023-01-12 MED ORDER — ADULT MULTIVITAMIN W/MINERALS CH: 1.0000 | ORAL_TABLET | Freq: Every day | ORAL | 0 refills | Status: DC

## 2023-01-12 MED ORDER — PEG 3350 17 G PO PACK: 17.0000 g | PACK | Freq: Every day | ORAL | 0 refills | Status: DC | PRN

## 2023-01-12 MED ORDER — GABAPENTIN 100 MG PO CAPS: 200.0000 mg | ORAL_CAPSULE | Freq: Three times a day (TID) | ORAL | 0 refills | Status: DC

## 2023-01-12 MED ORDER — TRAZODONE HCL 50 MG PO TABS: 50.0000 mg | ORAL_TABLET | Freq: Every day | ORAL | 0 refills | Status: DC

## 2023-01-12 NOTE — Plan of Care (Signed)
?  Problem: Education: ?Goal: Knowledge of [facility] information/materials will improve ?Outcome: Adequate for Discharge ?Goal: Emotional status will improve ?Outcome: Adequate for Discharge ?Goal: Mental status will improve ?Outcome: Adequate for Discharge ?Goal: Verbalization of understanding the information provided will improve ?Outcome: Adequate for Discharge ?  ?Problem: Activity: ?Goal: Interest or engagement in activities will improve ?Outcome: Adequate for Discharge ?Goal: Sleeping patterns will improve ?Outcome: Adequate for Discharge ?  ?Problem: Coping: ?Goal: Ability to verbalize frustrations and anger appropriately will improve ?Outcome: Adequate for Discharge ?Goal: Ability to demonstrate self-control will improve ?Outcome: Adequate for Discharge ?  ?Problem: Health Behavior/Discharge Planning: ?Goal: Identification of resources available to assist in meeting health care needs will improve ?Outcome: Adequate for Discharge ?Goal: Compliance with treatment plan for underlying cause of condition will improve ?Outcome: Adequate for Discharge ?  ?Problem: Physical Regulation: ?Goal: Ability to maintain clinical measurements within normal limits will improve ?Outcome: Adequate for Discharge ?  ?Problem: Safety: ?Goal: Periods of time without injury will increase ?Outcome: Adequate for Discharge ?  ?Problem: Education: ?Goal: Utilization of techniques to improve thought processes will improve ?Outcome: Adequate for Discharge ?Goal: Knowledge of the prescribed therapeutic regimen will improve ?Outcome: Adequate for Discharge ?  ?Problem: Activity: ?Goal: Interest or engagement in leisure activities will improve ?Outcome: Adequate for Discharge ?Goal: Imbalance in normal sleep/wake cycle will improve ?Outcome: Adequate for Discharge ?  ?Problem: Coping: ?Goal: Coping ability will improve ?Outcome: Adequate for Discharge ?Goal: Will verbalize feelings ?Outcome: Adequate for Discharge ?   ?Problem: Health Behavior/Discharge Planning: ?Goal: Ability to make decisions will improve ?Outcome: Adequate for Discharge ?Goal: Compliance with therapeutic regimen will improve ?Outcome: Adequate for Discharge ?  ?Problem: Role Relationship: ?Goal: Will demonstrate positive changes in social behaviors and relationships ?Outcome: Adequate for Discharge ?  ?Problem: Safety: ?Goal: Ability to disclose and discuss suicidal ideas will improve ?Outcome: Adequate for Discharge ?Goal: Ability to identify and utilize support systems that promote safety will improve ?Outcome: Adequate for Discharge ?  ?Problem: Self-Concept: ?Goal: Will verbalize positive feelings about self ?Outcome: Adequate for Discharge ?Goal: Level of anxiety will decrease ?Outcome: Adequate for Discharge ?  ?Problem: Education: ?Goal: Ability to state activities that reduce stress will improve ?Outcome: Adequate for Discharge ?  ?Problem: Coping: ?Goal: Ability to identify and develop effective coping behavior will improve ?Outcome: Adequate for Discharge ?  ?Problem: Self-Concept: ?Goal: Ability to identify factors that promote anxiety will improve ?Outcome: Adequate for Discharge ?Goal: Level of anxiety will decrease ?Outcome: Adequate for Discharge ?Goal: Ability to modify response to factors that promote anxiety will improve ?Outcome: Adequate for Discharge ?  ?

## 2023-01-12 NOTE — Progress Notes (Signed)
   01/12/23 0800  Psychosocial Assessment  Patient Complaints None  Eye Contact Fair  Facial Expression Animated  Affect Appropriate to circumstance  Speech Logical/coherent  Interaction Assertive;Minimal  Motor Activity Other (Comment) (Unremarkable.)  Appearance/Hygiene Unremarkable  Behavior Characteristics Cooperative;Appropriate to situation  Mood Pleasant  Thought Process  Coherency WDL  Content WDL  Delusions None reported or observed  Perception WDL  Hallucination None reported or observed  Judgment Impaired  Confusion None  Danger to Self  Current suicidal ideation? Denies  Agreement Not to Harm Self Yes  Description of Agreement Verbal  Danger to Others  Danger to Others None reported or observed

## 2023-01-12 NOTE — [facility] Suicide Risk Assessment (Signed)
[facility] INPATIENT:  Family/Significant Other Suicide Prevention Education  Suicide Prevention Education:  Education Completed; Yaakov Saindon,  (name of family member/significant other) has been identified by the patient as the family member/significant other with whom the patient will be residing, and identified as the person(s) who will aid the patient in the event of a mental health crisis (suicidal ideations/suicide attempt).  With written consent from the patient, the family member/significant other has been provided the following suicide prevention education, prior to the and/or following the discharge of the patient.  The suicide prevention education provided includes the following: Suicide risk factors Suicide prevention and interventions National Suicide Hotline telephone number [facility] assessment telephone number [facility] Emergency Assistance 911 [facility] and/or Residential Mobile Crisis Unit telephone number  Request made of family/significant other to: Remove weapons (e.g., guns, rifles, knives), all items previously/currently identified as safety concern.   Remove drugs/medications (over-the-counter, prescriptions, illicit drugs), all items previously/currently identified as a safety concern.  Redmond Baseman reports that he has been communicating with patient, stating, "I feel he's ready to come home" He endorses that patient has a strong support system in the community, including family and friends. Redmond Baseman denies any firearms or weapons in the home. He has agreed to provide transportation for patient at discharge, and to his follow up appts, if needed.   The family member/significant other verbalizes understanding of the suicide prevention education information provided.  The family member/significant other agrees to remove the items of safety concern listed above.  Bridgett Larsson 01/12/2023, 10:13 AM
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
## 2023-01-12 NOTE — B[facility] Group Notes (Signed)
LCSW Wellness Group Note   01/12/2023 09:30am  Type of Group and Topic: Psychoeducational Group:  Wellness  Participation Level:  did not attend  Description of Group  Wellness group introduces the topic and its focus on developing healthy habits across the spectrum and its relationship to a decrease in hospital admissions.  Six areas of wellness are discussed: physical, social spiritual, intellectual, occupational, and emotional.  Patients are asked to consider their current wellness habits and to identify areas of wellness where they are interested and able to focus on improvements.    Therapeutic Goals Patients will understand components of wellness and how they can positively impact overall health.  Patients will identify areas of wellness where they have developed good habits. Patients will identify areas of wellness where they would like to make improvements.    Summary of Patient Progress     Therapeutic Modalities: Cognitive Behavioral Therapy Psychoeducation    Dev Dhondt Jon, LCSW  

## 2023-01-12 NOTE — Progress Notes (Signed)
Adult Psychoeducational Group Note  Date:  01/12/2023 Time:  1:31 AM  Group Topic/Focus:  Wrap-Up Group:   The focus of this group is to help patients review their daily goal of treatment and discuss progress on daily workbooks.  Participation Level:  Did Not Attend  Participation Quality:  Appropriate  Affect:  Appropriate  Cognitive:  Appropriate  Insight: Appropriate  Engagement in Group:  None  Modes of Intervention:  Discussion  Additional Comments:  Pt did not attend group.Joselyn Arrow 01/12/2023, 1:31 AM

## 2023-01-12 NOTE — Progress Notes (Addendum)
  [facility] Adult Case Management Discharge Plan :  Will you be returning to the same living situation after discharge:  Yes,  Patient will return to his residence, residing with adult son and son's fiance At discharge, do you have transportation home?: Yes,  Pt's son will provide transportation Do you have the ability to pay for your medications: Yes,  Patient has medical insurance to assist with medication costs  Release of information consent forms completed and in the chart;  Patient's signature needed at discharge.  Patient to Follow up at:  Follow-up Information     [name], MD. Go on 01/24/2023.   Specialty: Family Medicine Why: You have an appointment on 01/24/23 at 10:30 am with your primary care provider for medication management services.  Please schedule an appointment as soon as possible with the therapist as well. Contact information: [address] [place] [address] [phone]         [facility]. Schedule an appointment as soon as possible for a visit in 1 week(s).   Specialty: Urgent Care Why: Make an appt for individual therapy Contact information: [address] [place] [address] [phone]                Next level of care provider has access to [facility] Link:yes  Safety Planning and Suicide Prevention discussed: Yes,  completed with patient and his adult son, Orlondo Holycross     Has patient been referred to the Quitline?: Patient refused referral for treatment  Patient has been referred for addiction treatment: No known substance use disorder.  Bridgett Larsson, LCSW 01/12/2023, 10:19 AM

## 2023-01-12 NOTE — Progress Notes (Signed)
   01/11/23 2115  Psych Admission Type (Psych Patients Only)  Admission Status Involuntary  Psychosocial Assessment  Patient Complaints None  Eye Contact Fair  Facial Expression Animated  Affect Appropriate to circumstance  Speech Logical/coherent  Interaction Assertive;Minimal  Motor Activity Other (Comment) (WNL)  Appearance/Hygiene Unremarkable  Behavior Characteristics Cooperative;Appropriate to situation  Mood Pleasant  Thought Process  Coherency WDL  Content WDL  Delusions None reported or observed  Perception WDL  Hallucination None reported or observed  Judgment Impaired  Confusion None  Danger to Self  Current suicidal ideation? Denies  Agreement Not to Harm Self Yes  Description of Agreement verbal  Danger to Others  Danger to Others None reported or observed   Pt is pleasant and cooperative. Pt was offered support and encouragement. Pt was given scheduled medications. Pt was encouraged to attend groups. Q 15 minute checks were done for safety. Pt did not attend group, remained in bedroom. Interacts well with peers and staff.  Pt has no complaints .Pt receptive to treatment and safety maintained on unit.
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
## 2023-01-12 NOTE — Progress Notes (Signed)
D: Patient verbalizes readiness for discharge, denies suicidal and homicidal ideations, denies auditory and visual hallucinations.  No complaints of pain. Suicide Safety Plan completed and copy placed in the chart.  A:  Patient receptive to discharge instructions. Questions encouraged, both verbalize understanding.  R:  Escorted to the lobby by this RN, son Redmond Baseman gave him a hug and verbalized appreciation of care provided.

## 2023-01-12 NOTE — Progress Notes (Signed)
   01/12/23 0604  15 Minute Checks  Location Bedroom  Visual Appearance Calm  Behavior Sleeping  Sleep (Behavioral Health Patients Only)  Calculate sleep? (Click Yes once per 24 hr at 0600 safety check) Yes  Documented sleep last 24 hours 9.5
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
## 2023-01-14 ENCOUNTER — Ambulatory Visit: Payer: Self-pay | Admitting: Student

## 2023-01-22 ENCOUNTER — Other Ambulatory Visit (HOSPITAL_COMMUNITY): Payer: Self-pay | Admitting: Nurse Practitioner

## 2023-01-22 DIAGNOSIS — L24B3 Irritant contact dermatitis related to fecal or urinary stoma or fistula: Secondary | ICD-10-CM

## 2023-01-22 DIAGNOSIS — K9400 Colostomy complication, unspecified: Secondary | ICD-10-CM

## 2023-01-24 ENCOUNTER — Ambulatory Visit (INDEPENDENT_AMBULATORY_CARE_PROVIDER_SITE_OTHER): Payer: Medicare Other | Admitting: Student

## 2023-01-24 ENCOUNTER — Encounter: Payer: Self-pay | Admitting: Student

## 2023-01-24 VITALS — BP 141/96 | HR 92 | Ht 69.0 in | Wt 154.0 lb

## 2023-01-24 DIAGNOSIS — L02211 Cutaneous abscess of abdominal wall: Secondary | ICD-10-CM

## 2023-01-24 DIAGNOSIS — I10 Essential (primary) hypertension: Secondary | ICD-10-CM | POA: Diagnosis not present

## 2023-01-24 DIAGNOSIS — R911 Solitary pulmonary nodule: Secondary | ICD-10-CM

## 2023-01-24 DIAGNOSIS — J449 Chronic obstructive pulmonary disease, unspecified: Secondary | ICD-10-CM | POA: Diagnosis not present

## 2023-01-24 DIAGNOSIS — F1994 Other psychoactive substance use, unspecified with psychoactive substance-induced mood disorder: Secondary | ICD-10-CM

## 2023-01-24 DIAGNOSIS — F1094 Alcohol use, unspecified with alcohol-induced mood disorder: Secondary | ICD-10-CM

## 2023-01-24 DIAGNOSIS — F1090 Alcohol use, unspecified, uncomplicated: Secondary | ICD-10-CM

## 2023-01-24 DIAGNOSIS — Z720 Tobacco use: Secondary | ICD-10-CM | POA: Diagnosis not present

## 2023-01-24 DIAGNOSIS — D75839 Thrombocytosis, unspecified: Secondary | ICD-10-CM | POA: Diagnosis not present

## 2023-01-24 MED ORDER — ANORO ELLIPTA 62.5-25 MCG/ACT IN AEPB
1.0000 | INHALATION_SPRAY | Freq: Every day | RESPIRATORY_TRACT | 6 refills | Status: DC
Start: 2023-01-24 — End: 2023-05-22

## 2023-01-24 MED ORDER — CYCLOBENZAPRINE HCL 10 MG PO TABS
10.0000 mg | ORAL_TABLET | Freq: Every day | ORAL | 0 refills | Status: AC | PRN
Start: 2023-01-24 — End: ?

## 2023-01-24 NOTE — Patient Instructions (Addendum)
It was great to see you! Thank you for allowing me to participate in your care!   I recommend that you always bring your medications to each appointment as this makes it easy to ensure we are on the correct medications and helps [place] not miss when refills are needed.  Our plans for today:  - Start the chantix again and schedule Dr. Raymondo Band appointment for smoking cessation - I am getting a CT Lung and referring you again to the lung doctors - I refilled your inhaler and flexeril  We are checking some labs today, I will call you if they are abnormal will send you a MyChart message or a letter if they are normal.  If you do not hear about your labs in the next 2 weeks please let us know.  Take care and seek immediate care sooner if you develop any concerns. Please remember to show up 15 minutes before your scheduled appointment time!  [name], MD [facility]

## 2023-01-24 NOTE — Progress Notes (Unsigned)
    SUBJECTIVE:   CHIEF COMPLAINT / HPI: Follow up  Lung Nodules  Interstitial Lung Disease CT lung     PERTINENT  PMH / PSH: ***  OBJECTIVE:   There were no vitals taken for this visit.  ***  ASSESSMENT/PLAN:   No problem-specific Assessment & Plan notes found for this encounter.     [name], MD [facility]

## 2023-01-26 NOTE — Assessment & Plan Note (Signed)
Feels anoro ellipta is helping. He is doing this daily. -Refilled Rx -Pulm referral -Smoking cessation as below

## 2023-01-26 NOTE — Assessment & Plan Note (Signed)
Stable pulmonary exam. He is amenable to pulmonology referral today.  -Noncontrast CT Lung -Pulmonology referral for nodules and ILD

## 2023-01-26 NOTE — Assessment & Plan Note (Signed)
Has been doing better since [facility] admission on depakote. -Continue current medications regimen -Discussed importance on decreasing alcohol intake

## 2023-01-26 NOTE — Assessment & Plan Note (Signed)
He is willing to quit now with the strong motivator of having his ostomy reversed. -Restart chantix -Pharmacy clinic follow up for smoking cessation strategies

## 2023-01-26 NOTE — Assessment & Plan Note (Signed)
Improved on recent CBC. Will watch closely and see what CT lung shows.  -Consider repeat CBC at next visit -CT Lung  -Low threshold to [facility] hematology if still elevated

## 2023-01-26 NOTE — Assessment & Plan Note (Deleted)
Congratulated patient on weaning his alcohol use. 12 beers a day to now 3-4 beers daily. No withdrawal symptoms. -Continue to monitor 

## 2023-01-26 NOTE — Assessment & Plan Note (Signed)
Did not take his medication today. -Monitor at future visit, consider titration up if still elevated

## 2023-01-26 NOTE — Assessment & Plan Note (Signed)
Congratulated patient on weaning his alcohol use. 12 beers a day to now 3-4 beers daily. No withdrawal symptoms. -Continue to monitor

## 2023-01-31 ENCOUNTER — Ambulatory Visit (HOSPITAL_COMMUNITY)
Admission: RE | Admit: 2023-01-31 | Discharge: 2023-01-31 | Disposition: A | Discharge status: Home / Self Care | Payer: Medicare Other | Source: Ambulatory Visit | Attending: Nurse Practitioner | Admitting: Nurse Practitioner

## 2023-01-31 DIAGNOSIS — K634 Enteroptosis: Secondary | ICD-10-CM | POA: Diagnosis not present

## 2023-01-31 DIAGNOSIS — K9400 Colostomy complication, unspecified: Secondary | ICD-10-CM | POA: Insufficient documentation

## 2023-01-31 NOTE — Progress Notes (Signed)
[facility]   Reason for visit:  LLQ prolapsed stoma.  Is running low on supplies. No one from [facility] has returned his call in over 2 weeks.  He would like to discontinue their services and be set up with supplies.   HPI:   Past Medical History:  Diagnosis Date  . Acute bronchitis 02/15/2013  . Alcohol abuse   . Anxiety   . Bipolar disorder (HCC)   . Bronchitis    history  . Bronchitis   . Cellulitis    - left knee-2009  . Chronic pain   . Complication of anesthesia   . Condyloma 02/04/2012  . Depression   . Diverticulosis    By colonoscopy June 2005  . Dyspnea    on exertion at times  . Elevated CK 09/2011  . Emphysema lung (HCC)   . Emphysema of lung (HCC)   . Family history of anesthesia complication    Mother N/V  . GERD (gastroesophageal reflux disease)   . Glaucoma syndrome    does not use eye drops, "They burn".  Elon Jester)    "alot; not daily" (07/23/2012)  . Heavy smoker   . Hemorrhoids   . History of colostomy 03/21/2012   Left colectomy and anastomosis performed by Chevis Pretty, MD on 02/18/13 for recurrent diverticulitis Exploratory laparotomy preformed 02/27/12 for anastomotic leak and Hartman pouch/colostomy peformed Colostomy taken down January 2014  . History of diverticulitis of colon 10/16/2011  . History of dizziness   . History of stomach ulcers 2005  . HPV (human papilloma virus) infection   . Hypertension    started 3 months ago  . Hypoglycemia   . Incisional hernia   . Lower GI bleed    June 2005. Presumably secondary to diverticulosis.  . Migraine headache 08/23/2012  . Migraines    "not often" (07/23/2012)  . 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 Ventral hernia 01/11/2013   Repaired with mesh 05/20/13  . Wrist pain 01/26/2019   Family History  Problem Relation Age of Onset  . Diabetes Mother   . Parkinsonism Mother   . Depression Mother   . Alcohol abuse Father   . Hypertension Father   . Anxiety  disorder Father   . Ulcers Father   . Colon polyps Father   . Breast cancer Maternal Aunt   . Diabetes Maternal Grandmother   . Stroke Neg Hx   . Heart disease Neg Hx   . Colon cancer Neg Hx   . Stomach cancer Neg Hx    Allergies  Allergen Reactions  . Ambien [Zolpidem Tartrate] Other (See Comments)    Agitation  . Bactroban [Mupirocin Calcium] Swelling    Caused infection and swelling at a surgical site  . Zestril [Lisinopril] Cough  . Darvon [Propoxyphene] Itching  . Morphine And Codeine Itching   Current Outpatient Medications  Medication Sig Dispense Refill Last Dose  . albuterol (VENTOLIN HFA) 108 (90 Base) MCG/ACT inhaler Inhale 2 puffs into the lungs every 6 (six) hours as needed for wheezing or shortness of breath. 8 g 3   . busPIRone (BUSPAR) 15 MG tablet Take 1 tablet (15 mg total) by mouth every morning. 30 tablet 0   . cyanocobalamin 1000 MCG tablet Take 1 tablet (1,000 mcg total) by mouth daily. 30 tablet 0   . cyclobenzaprine (FLEXERIL) 10 MG tablet Take 1 tablet (10 mg total) by mouth daily as needed for muscle spasms. 30 tablet 0   . divalproex (  DEPAKOTE ER) 500 MG 24 hr tablet Take 1 tablet (500 mg total) by mouth daily. 30 tablet 0   . gabapentin (NEURONTIN) 100 MG capsule Take 2 capsules (200 mg total) by mouth 3 (three) times daily. 180 capsule 0   . losartan (COZAAR) 100 MG tablet Take 1 tablet (100 mg total) by mouth daily. 30 tablet 0   . Multiple Vitamin (MULTIVITAMIN WITH MINERALS) TABS tablet Take 1 tablet by mouth daily. 30 tablet 0   . pantoprazole (PROTONIX) 40 MG tablet Take 40 mg by mouth daily.     . Polyethylene Glycol 3350 (PEG 3350) 17 g PACK Take 17 g by mouth daily as needed. 7 each 0   . sertraline (ZOLOFT) 100 MG tablet Take 1 tablet (100 mg total) by mouth daily. 30 tablet 0   . traZODone (DESYREL) 50 MG tablet Take 1 tablet (50 mg total) by mouth at bedtime. 30 tablet 0   . umeclidinium-vilanterol (ANORO ELLIPTA) 62.5-25 MCG/ACT AEPB Inhale 1  puff into the lungs daily. 1 each 6    No current facility-administered medications for this encounter.   ROS  Review of Systems  Gastrointestinal:        LLQ colostomy, stomal prolapse  Skin: Negative.   All other systems reviewed and are negative.  Vital signs:  BP (!) 148/84   Pulse 85   Temp 97.6 F (36.4 C)   Resp 18   SpO2 98%  Exam:  Physical Exam Vitals reviewed.  Constitutional:      Appearance: Normal appearance.  Abdominal:     Palpations: Abdomen is soft.  Skin:    General: Skin is warm and dry.  Neurological:     Mental Status: He is oriented to person, place, and time.  Psychiatric:        Mood and Affect: Mood normal.        Behavior: Behavior normal.     Comments: Continues to drink daily, states he has been feeling better lately    Stoma type/location:  LLQ colostomy, prolapsed, extends 4.5 cm today.  Is pink and moist and producing soft brown stool Stomal assessment/size:  2 3/4" loop colostomy Peristomal assessment:  intact Treatment options for stomal/peristomal skin: barrier ring and 2 piece pouch.  Adding ostomy belt for ongoing support of prolapsed stoma.  Output: soft brown stool Ostomy pouching: 2pc.  2 3/4" pouch with barrier ring.  Adding ostomy belt for support.  Education provided:  Stoma retracts when lying down.  Sugar added to aid with this.  Pouch applied  Ostomy support belt added    Impression/dx  Prolapsed stoma Discussion  Prolapsed stoma can lessen blood supply to the stoma.  If producing stool, there is likely no problem.  Monitor for change in color such as dark red, purple or feels cool to touch.  If stool is not produced, call surgeon.  Plan  See back 2 weeks to ensure supplies are obtained and managing pouching well Will enroll with [facility]    Visit time: 40 minutes.   Maple Hudson FNP-BC

## 2023-01-31 NOTE — Discharge Instructions (Signed)
Will enroll with [facility] for supplies Will call [facility] for discharge Back in 2 weeks.

## 2023-02-03 DIAGNOSIS — K634 Enteroptosis: Secondary | ICD-10-CM | POA: Insufficient documentation

## 2023-02-03 DIAGNOSIS — K9400 Colostomy complication, unspecified: Secondary | ICD-10-CM | POA: Insufficient documentation

## 2023-02-03 HISTORY — DX: Enteroptosis: K63.4

## 2023-02-04 ENCOUNTER — Other Ambulatory Visit (HOSPITAL_COMMUNITY): Payer: Self-pay | Admitting: Nurse Practitioner

## 2023-02-04 DIAGNOSIS — L24B3 Irritant contact dermatitis related to fecal or urinary stoma or fistula: Secondary | ICD-10-CM

## 2023-02-04 DIAGNOSIS — K9400 Colostomy complication, unspecified: Secondary | ICD-10-CM

## 2023-02-04 DIAGNOSIS — K634 Enteroptosis: Secondary | ICD-10-CM

## 2023-02-11 ENCOUNTER — Ambulatory Visit (HOSPITAL_COMMUNITY): Payer: Medicare Other | Attending: Family Medicine

## 2023-02-14 ENCOUNTER — Ambulatory Visit (HOSPITAL_COMMUNITY): Payer: Medicare Other | Admitting: Nurse Practitioner

## 2023-02-24 ENCOUNTER — Other Ambulatory Visit: Payer: Self-pay | Admitting: *Deleted

## 2023-02-24 MED ORDER — DIVALPROEX SODIUM ER 500 MG PO TB24: 500.0000 mg | ORAL_TABLET | Freq: Every day | ORAL | 0 refills | Status: DC

## 2023-02-24 NOTE — Progress Notes (Deleted)
    SUBJECTIVE:   CHIEF COMPLAINT / HPI:   ***  PERTINENT  PMH / PSH: ***  OBJECTIVE:   There were no vitals taken for this visit.  ***  ASSESSMENT/PLAN:   No problem-specific Assessment & Plan notes found for this encounter.      , MD  [facility]  

## 2023-02-27 ENCOUNTER — Ambulatory Visit: Payer: Medicare Other | Admitting: Pharmacist

## 2023-02-27 ENCOUNTER — Ambulatory Visit: Payer: Medicare Other | Admitting: Student

## 2023-03-04 ENCOUNTER — Ambulatory Visit (HOSPITAL_COMMUNITY): Payer: Medicare Other | Admitting: Nurse Practitioner

## 2023-03-11 ENCOUNTER — Other Ambulatory Visit: Payer: Self-pay

## 2023-03-11 DIAGNOSIS — I10 Essential (primary) hypertension: Secondary | ICD-10-CM

## 2023-03-11 DIAGNOSIS — F339 Major depressive disorder, recurrent, unspecified: Secondary | ICD-10-CM

## 2023-03-11 DIAGNOSIS — F410 Panic disorder [episodic paroxysmal anxiety] without agoraphobia: Secondary | ICD-10-CM

## 2023-03-11 MED ORDER — SERTRALINE HCL 100 MG PO TABS
100.0000 mg | ORAL_TABLET | Freq: Every day | ORAL | 0 refills | Status: DC
Start: 2023-03-11 — End: 2023-05-22

## 2023-03-11 MED ORDER — ALBUTEROL SULFATE HFA 108 (90 BASE) MCG/ACT IN AERS: 2.0000 | INHALATION_SPRAY | Freq: Four times a day (QID) | RESPIRATORY_TRACT | 3 refills | Status: DC | PRN

## 2023-03-11 MED ORDER — BUSPIRONE HCL 15 MG PO TABS
15.0000 mg | ORAL_TABLET | Freq: Every morning | ORAL | 0 refills | Status: DC
Start: 2023-03-11 — End: 2023-04-02

## 2023-03-11 MED ORDER — PANTOPRAZOLE SODIUM 40 MG PO TBEC: 40.0000 mg | DELAYED_RELEASE_TABLET | Freq: Every day | ORAL | 0 refills | Status: DC

## 2023-03-11 MED ORDER — LOSARTAN POTASSIUM 100 MG PO TABS
100.0000 mg | ORAL_TABLET | Freq: Every day | ORAL | 0 refills | Status: DC
Start: 2023-03-11 — End: 2023-04-02

## 2023-03-13 ENCOUNTER — Ambulatory Visit: Payer: Medicare Other | Admitting: Pharmacist

## 2023-03-13 ENCOUNTER — Ambulatory Visit: Payer: Medicare Other | Admitting: Student

## 2023-03-13 NOTE — Progress Notes (Deleted)
    SUBJECTIVE:   CHIEF COMPLAINT / HPI:   ***  PERTINENT  PMH / PSH: ***  OBJECTIVE:   There were no vitals taken for this visit.  ***  ASSESSMENT/PLAN:   No problem-specific Assessment & Plan notes found for this encounter.     Mayuri Jagadish, MD  [facility]  

## 2023-04-02 ENCOUNTER — Other Ambulatory Visit: Payer: Self-pay | Admitting: Student

## 2023-04-02 DIAGNOSIS — I10 Essential (primary) hypertension: Secondary | ICD-10-CM

## 2023-04-02 DIAGNOSIS — F410 Panic disorder [episodic paroxysmal anxiety] without agoraphobia: Secondary | ICD-10-CM

## 2023-04-03 ENCOUNTER — Institutional Professional Consult (permissible substitution) (HOSPITAL_BASED_OUTPATIENT_CLINIC_OR_DEPARTMENT_OTHER): Payer: Medicare Other | Admitting: Pulmonary Disease

## 2023-04-03 ENCOUNTER — Encounter (HOSPITAL_BASED_OUTPATIENT_CLINIC_OR_DEPARTMENT_OTHER): Payer: Self-pay | Admitting: Pulmonary Disease

## 2023-04-15 ENCOUNTER — Ambulatory Visit: Payer: Medicare Other | Admitting: Student

## 2023-04-23 ENCOUNTER — Other Ambulatory Visit: Payer: Self-pay | Admitting: Student

## 2023-04-23 DIAGNOSIS — L02211 Cutaneous abscess of abdominal wall: Secondary | ICD-10-CM

## 2023-04-25 ENCOUNTER — Other Ambulatory Visit: Payer: Self-pay

## 2023-04-28 NOTE — Progress Notes (Deleted)
    SUBJECTIVE:   CHIEF COMPLAINT / HPI:   ***  PERTINENT  PMH / PSH: ***  OBJECTIVE:   There were no vitals taken for this visit.  ***  ASSESSMENT/PLAN:   No problem-specific Assessment & Plan notes found for this encounter.     Breawna Montenegro, MD  [facility]  

## 2023-04-29 ENCOUNTER — Ambulatory Visit: Payer: Medicare Other | Admitting: Student

## 2023-05-15 ENCOUNTER — Other Ambulatory Visit: Payer: Self-pay | Admitting: Student

## 2023-05-15 DIAGNOSIS — I10 Essential (primary) hypertension: Secondary | ICD-10-CM

## 2023-05-20 ENCOUNTER — Other Ambulatory Visit: Payer: Self-pay | Admitting: Student

## 2023-05-20 DIAGNOSIS — L02211 Cutaneous abscess of abdominal wall: Secondary | ICD-10-CM

## 2023-05-22 ENCOUNTER — Ambulatory Visit (INDEPENDENT_AMBULATORY_CARE_PROVIDER_SITE_OTHER): Payer: Medicare Other | Admitting: Student

## 2023-05-22 VITALS — BP 168/123 | HR 123 | Temp 98.1°F | Ht 69.0 in | Wt 158.1 lb

## 2023-05-22 DIAGNOSIS — F339 Major depressive disorder, recurrent, unspecified: Secondary | ICD-10-CM | POA: Diagnosis not present

## 2023-05-22 DIAGNOSIS — F410 Panic disorder [episodic paroxysmal anxiety] without agoraphobia: Secondary | ICD-10-CM

## 2023-05-22 DIAGNOSIS — J449 Chronic obstructive pulmonary disease, unspecified: Secondary | ICD-10-CM

## 2023-05-22 DIAGNOSIS — T81321S Disruption or dehiscence of closure of internal operation (surgical) wound of abdominal wall muscle or fascia, sequela: Secondary | ICD-10-CM

## 2023-05-22 DIAGNOSIS — L02211 Cutaneous abscess of abdominal wall: Secondary | ICD-10-CM

## 2023-05-22 DIAGNOSIS — I10 Essential (primary) hypertension: Secondary | ICD-10-CM | POA: Diagnosis not present

## 2023-05-22 DIAGNOSIS — R5383 Other fatigue: Secondary | ICD-10-CM

## 2023-05-22 DIAGNOSIS — T300 Burn of unspecified body region, unspecified degree: Secondary | ICD-10-CM | POA: Diagnosis not present

## 2023-05-22 DIAGNOSIS — R1084 Generalized abdominal pain: Secondary | ICD-10-CM

## 2023-05-22 MED ORDER — CYANOCOBALAMIN 1000 MCG PO TABS: 1000.0000 ug | ORAL_TABLET | Freq: Every day | ORAL | 0 refills | Status: DC

## 2023-05-22 MED ORDER — SERTRALINE HCL 100 MG PO TABS
100.0000 mg | ORAL_TABLET | Freq: Every day | ORAL | 0 refills | Status: DC
Start: 2023-05-22 — End: 2023-07-14

## 2023-05-22 MED ORDER — GABAPENTIN 100 MG PO CAPS: 200.0000 mg | ORAL_CAPSULE | Freq: Three times a day (TID) | ORAL | 0 refills | Status: DC

## 2023-05-22 MED ORDER — ALBUTEROL SULFATE HFA 108 (90 BASE) MCG/ACT IN AERS: 2.0000 | INHALATION_SPRAY | Freq: Four times a day (QID) | RESPIRATORY_TRACT | 3 refills | Status: DC | PRN

## 2023-05-22 MED ORDER — BUSPIRONE HCL 15 MG PO TABS: 15.0000 mg | ORAL_TABLET | Freq: Every morning | ORAL | 0 refills | Status: DC

## 2023-05-22 MED ORDER — PANTOPRAZOLE SODIUM 40 MG PO TBEC: 40.0000 mg | DELAYED_RELEASE_TABLET | Freq: Every day | ORAL | 0 refills | Status: DC

## 2023-05-22 MED ORDER — DIVALPROEX SODIUM ER 500 MG PO TB24: 500.0000 mg | ORAL_TABLET | Freq: Every day | ORAL | 0 refills | Status: DC

## 2023-05-22 MED ORDER — LOSARTAN POTASSIUM 100 MG PO TABS: 100.0000 mg | ORAL_TABLET | Freq: Every day | ORAL | 0 refills | Status: DC

## 2023-05-22 MED ORDER — PEG 3350 17 G PO PACK: 17.0000 g | PACK | Freq: Every day | ORAL | 0 refills | Status: AC | PRN

## 2023-05-22 MED ORDER — ANORO ELLIPTA 62.5-25 MCG/ACT IN AEPB: 1.0000 | INHALATION_SPRAY | Freq: Every day | RESPIRATORY_TRACT | 6 refills | Status: DC

## 2023-05-22 MED ORDER — CYCLOBENZAPRINE HCL 10 MG PO TABS: 10.0000 mg | ORAL_TABLET | Freq: Three times a day (TID) | ORAL | 0 refills | Status: DC | PRN

## 2023-05-22 MED ORDER — ADULT MULTIVITAMIN W/MINERALS CH: 1.0000 | ORAL_TABLET | Freq: Every day | ORAL | 0 refills | Status: DC

## 2023-05-22 MED ORDER — TRAZODONE HCL 50 MG PO TABS: 50.0000 mg | ORAL_TABLET | Freq: Every day | ORAL | 0 refills | Status: DC

## 2023-05-22 NOTE — Patient Instructions (Addendum)
It was great to see you! Thank you for allowing me to participate in your care!   Our plans for today:  - I recommend evaluation in the emergency department-I am concerned about a bloodstream infection - I recommend a CT of your abdomen/pelvis - I recommend please go to the emergency department if you continue to have vomiting, any fevers, continued shaking, abdominal pain -Will call you tomorrow with the results of your labs  Take care and seek immediate care sooner if you develop any concerns.  [name], MD
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
## 2023-05-23 ENCOUNTER — Telehealth: Payer: Self-pay | Admitting: Student

## 2023-05-23 LAB — COMPREHENSIVE METABOLIC PANEL
ALT: 17 [IU]/L (ref 0–44)
AST: 23 [IU]/L (ref 0–40)
Albumin: 4.3 g/dL (ref 3.8–4.9)
Alkaline Phosphatase: 114 [IU]/L (ref 44–121)
BUN/Creatinine Ratio: 8 — ABNORMAL LOW (ref 9–20)
BUN: 7 mg/dL (ref 6–24)
Bilirubin Total: <0.2 mg/dL (ref 0.0–1.2)
CO2: 23 mmol/L (ref 20–29)
Calcium: 9.7 mg/dL (ref 8.7–10.2)
Chloride: 101 mmol/L (ref 96–106)
Creatinine, Ser: 0.84 mg/dL (ref 0.76–1.27)
Globulin, Total: 2.8 g/dL (ref 1.5–4.5)
Glucose: 98 mg/dL (ref 70–99)
Potassium: 4.6 mmol/L (ref 3.5–5.2)
Sodium: 142 mmol/L (ref 134–144)
Total Protein: 7.1 g/dL (ref 6.0–8.5)
eGFR: 104 mL/min/{1.73_m2} (ref >59)

## 2023-05-23 LAB — CBC WITH DIFFERENTIAL/PLATELET
Basophils Absolute: 0 10*3/uL (ref 0.0–0.2)
Basos: 0 %
EOS (ABSOLUTE): 0.1 10*3/uL (ref 0.0–0.4)
Eos: 1 %
Hematocrit: 45.7 % (ref 37.5–51.0)
Hemoglobin: 14.1 g/dL (ref 13.0–17.7)
Immature Grans (Abs): 0 10*3/uL (ref 0.0–0.1)
Immature Granulocytes: 0 %
Lymphocytes Absolute: 2.2 10*3/uL (ref 0.7–3.1)
Lymphs: 21 %
MCH: 30.5 pg (ref 26.6–33.0)
MCHC: 30.9 g/dL — ABNORMAL LOW (ref 31.5–35.7)
MCV: 99 fL — ABNORMAL HIGH (ref 79–97)
Monocytes Absolute: 0.7 10*3/uL (ref 0.1–0.9)
Monocytes: 7 %
Neutrophils Absolute: 7.4 10*3/uL — ABNORMAL HIGH (ref 1.4–7.0)
Neutrophils: 71 %
Platelets: 415 10*3/uL (ref 150–450)
RBC: 4.63 x10E6/uL (ref 4.14–5.80)
RDW: 12.7 % (ref 11.6–15.4)
WBC: 10.4 10*3/uL (ref 3.4–10.8)

## 2023-05-23 NOTE — Telephone Encounter (Signed)
Discussed he is still tired and slept a lot. States he has not vomited today. Does have some bleeding in ostomy. States that site is still reducible. Still having good fecal output.  Called [facility] surgery who advised him to go to the emergency department however he does not want to go. Scheduled to see [facility] on the [facility]th. Discussed if having continued fatigue, vomiting, abdominal pain, drainage from his abdominal site, any fevers, issues with fecal output or any issues with reducing stoma recommend evaluation in ED. he is aware and knows of the risks of not going to emergency department including death.  Discussed his lab results with him as well CBC overall okay with neutrophilic shift.  CMP within normal limits.  I have scheduled patient to come back to our clinic for recheck on 11/5 at 10:50 AM. Will route to provider who will see him that day

## 2023-05-23 NOTE — Telephone Encounter (Signed)
Called patient x 5. No answer. Let voicemail to call clinic back and number.  If patient calls back:  His labs overall look okay but does have a neutrophilic shift on his CBC even though his white count is "normal." His white count didn't get very high when he did have sepsis in April.  His electrolytes, kidney function and liver function look fine.  If continued fatigue, vomiting, abdominal pain, drainage from his abdominal site, any fevers, issues with fecal output or any issues with reducing stoma recommend evaluation in ED.  Will attempt to call patient again later.

## 2023-05-27 ENCOUNTER — Encounter: Payer: Self-pay | Admitting: Family Medicine

## 2023-05-27 ENCOUNTER — Ambulatory Visit (INDEPENDENT_AMBULATORY_CARE_PROVIDER_SITE_OTHER): Payer: Self-pay | Admitting: Family Medicine

## 2023-05-27 VITALS — BP 156/101 | HR 84 | Ht 70.0 in | Wt 163.4 lb

## 2023-05-27 DIAGNOSIS — F3162 Bipolar disorder, current episode mixed, moderate: Secondary | ICD-10-CM

## 2023-05-27 DIAGNOSIS — I10 Essential (primary) hypertension: Secondary | ICD-10-CM

## 2023-05-27 DIAGNOSIS — K9400 Colostomy complication, unspecified: Secondary | ICD-10-CM | POA: Diagnosis present

## 2023-05-27 NOTE — Assessment & Plan Note (Addendum)
Persistently prolapsed colon with abdominal pain.  Reassured by only minimal bleeding and stable hemoglobin on recent CBC.  Consider blood from known peptic ulcer disease versus local irritation around tip of prolapsed colon.  Hemodynamically stable today and less likely septic, though WBC (while normal) is slightly increased from baseline.  Ostomy has good output on my exam today, and I am less worried for a bowel obstruction.  However, his history of recurrent abdominal surgeries places him at higher risk, and him having vomiting with increased volumes of food could be due to abdominal stricture.  Advised to eat smaller meals in the meantime.  Advised to take gabapentin as prescribed at 200 mg 3 times daily as well as Tylenol every 6 hours for pain.  Will await CTAP.  Follow-up with surgical subspecialist soon.  Strict ED precautions discussed of fever, continued worsening abdominal pain, vomiting unable to keep anything down, continued bleeding, lightheadedness/dizziness, and decreased output.  Patient very understanding and will go to ED if any of the complications occur.

## 2023-05-27 NOTE — Patient Instructions (Addendum)
I have increased your gabapentin dose for pain. Take tylenol every 6 hours for pain for the next week. Go to the ED if you have fever, worsening pain, vomiting, inability to keep food or water down, bleeding, or lightheadedness. If not able to get to [facility], please go to the [facility] ED to ensure your safety. We will follow your CT scan soon. Be sure to follow up with your surgeons soon for further care. Be sure to take your blood pressure medications as soon as you get home.

## 2023-05-27 NOTE — Assessment & Plan Note (Addendum)
Uncontrolled.  Unsure control given patient has not been able to take his medicine this morning.  Advised to take as soon as he gets it.  Follow-up next visit and consider addition of amlodipine versus HCTZ.

## 2023-05-27 NOTE — Progress Notes (Signed)
SUBJECTIVE:   CHIEF COMPLAINT / HPI:   Abdominal pain and bleeding from ostomy Has been having persistent abdominal pain and bleeding from ostomy over the last couple weeks.  Was last seen 05/22/2023 and recommended to go to the ED at that time, though he did not feel he needed to.  Labs collected at that time were overall reassuring.  CT abdomen pelvis was ordered which has been scheduled.    Since this last visit, he has continued to have abdominal pain and vomiting.  Abdominal pain mostly occurs if he eats too much.  He has only been taking 200 mg of gabapentin once a day.  Vomiting also occurs at this time.  No bleeding in the emesis.  Has had normal output through his ostomy.  The tip of the colon, which is protruding through the stoma, has turned a darker color, and this is where he sees some specks of blood in the soft stool.  He has pain around that site with palpation.  He denies fevers.  He is able to take in p.o. well.  He has a follow-up with the surgeon with [facility] soon.  Hypertension States he was going to pick up his medication or after leaving the office.  Was not able to take it this morning due to this.  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, bipolar disorder, hypertension, chronic wound infection  OBJECTIVE:   BP (!) 156/101   Pulse 84   Ht 5\' 10"  (1.778 m)   Wt 163 lb 6.4 oz (74.1 kg)   SpO2 100%   BMI 23.45 kg/m   General: Alert and oriented, in NAD Skin: Warm, dry, and intact without lesions HEENT: NCAT, EOM grossly normal, midline nasal septum Cardiac: RRR, no m/r/g appreciated Respiratory: CTAB, breathing and speaking comfortably on RA Abdominal: Soft, nontender, nondistended, normoactive bowel sounds Extremities: Moves all extremities grossly equally Neurological: No gross focal deficit Psychiatric: Appropriate mood and affect   ASSESSMENT/PLAN:   Essential hypertension,  benign Uncontrolled.  Unsure control given patient has not been able to take his medicine this morning.  Advised to take as soon as he gets it.  Follow-up next visit and consider addition of amlodipine versus HCTZ.  Bipolar disorder (HCC) PHQ-9 remains elevated at 12 though improved from prior.  Patient very pleasant on exam.  Continue home sertraline and BuSpar.  Colostomy complication (HCC) Persistently prolapsed colon with abdominal pain.  Reassured by only minimal bleeding and stable hemoglobin on recent CBC.  Consider blood from known peptic ulcer disease versus local irritation around tip of prolapsed colon.  Hemodynamically stable today and less likely septic, though WBC (while normal) is slightly increased from baseline.  Ostomy has good output on my exam today, and I am less worried for a bowel obstruction.  However, his history of recurrent abdominal surgeries places him at higher risk, and him having vomiting with increased volumes of food could be due to abdominal stricture.  Advised to eat smaller meals in the meantime.  Advised to take gabapentin as prescribed at 200 mg 3 times daily as well as Tylenol every 6 hours for pain.  Will await CTAP.  Follow-up with surgical subspecialist soon.  Strict ED precautions discussed of fever, continued worsening abdominal pain, vomiting unable to keep anything down, continued bleeding, lightheadedness/dizziness, and decreased output.  Patient very understanding and will go to ED if any of the complications occur.   Health maintenance Consider discussing  shingles vaccine at next visit with PCP.  He will be due for a Medicare annual wellness visit soon as well.  Janeal Holmes, MD [facility]

## 2023-05-27 NOTE — Assessment & Plan Note (Signed)
PHQ-9 remains elevated at 12 though improved from prior.  Patient very pleasant on exam.  Continue home sertraline and BuSpar.

## 2023-05-29 ENCOUNTER — Ambulatory Visit (HOSPITAL_COMMUNITY): Payer: Medicare Other | Attending: Family Medicine

## 2023-05-30 ENCOUNTER — Ambulatory Visit (HOSPITAL_COMMUNITY)
Admission: RE | Admit: 2023-05-30 | Discharge: 2023-05-30 | Disposition: A | Discharge status: Home / Self Care | Payer: Medicare Other | Source: Ambulatory Visit | Attending: Plastic Surgery | Admitting: Plastic Surgery

## 2023-05-30 DIAGNOSIS — K634 Enteroptosis: Secondary | ICD-10-CM

## 2023-05-30 DIAGNOSIS — K9400 Colostomy complication, unspecified: Secondary | ICD-10-CM

## 2023-05-30 DIAGNOSIS — K9409 Other complications of colostomy: Secondary | ICD-10-CM | POA: Insufficient documentation

## 2023-05-30 DIAGNOSIS — Z433 Encounter for attention to colostomy: Secondary | ICD-10-CM | POA: Diagnosis present
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
## 2023-06-03 ENCOUNTER — Ambulatory Visit: Payer: Self-pay | Admitting: Family Medicine

## 2023-06-03 NOTE — Discharge Instructions (Signed)
Adding barrier strips  Need to wear ostomy belt for support.

## 2023-06-10 ENCOUNTER — Ambulatory Visit: Payer: Medicare Other | Admitting: Student

## 2023-06-16 ENCOUNTER — Ambulatory Visit: Payer: Medicare Other | Admitting: Student

## 2023-06-16 ENCOUNTER — Telehealth: Payer: Self-pay | Admitting: Student

## 2023-06-16 NOTE — Telephone Encounter (Signed)
Patient called wanting to speak to medical director about a complaint. Sent [employer] geologist a message and she said she would call him tomorrow if she couldn't get to it today.   Thanks!

## 2023-06-16 NOTE — Progress Notes (Deleted)
SUBJECTIVE:   CHIEF COMPLAINT / HPI:   ***  PERTINENT  PMH / PSH: ***  OBJECTIVE:   There were no vitals taken for this visit.  ***  ASSESSMENT/PLAN:   No problem-specific Assessment & Plan notes found for this encounter.     [name], MD [facility]

## 2023-06-16 NOTE — Telephone Encounter (Signed)
Patient called again this afternoon regarding this issue. He stated he's been waiting all day for the medical director to call him back about the message he left with Shae earlier. Patient states he keeps getting 'nasty' letters in the mail regarding no showing appointments but he can come and wait 30 minutes in the waiting room for his doctor to call him back when he has an appointment. I told patient the no show letters are automatically printed and sent every time a patient no shows an appointment in order to hold patients accountable for their appointments. He then repeated himself and said his time is just as important as anybody else's at our office and he wants to know why he hasn't received a call back yet. Told patient the medical director see's patients among a lot of other things and that she did receive his message and will be calling him. He did not think that was suitable and said that this is 'bullshit' and that he will just find him another doctor while rambling on other curse words and then hung up.   Routing documentation to [facility] for when she calls patient back.

## 2023-06-17 ENCOUNTER — Telehealth: Payer: Self-pay | Admitting: Family Medicine

## 2023-06-17 NOTE — Telephone Encounter (Signed)
Research scientist (physical sciences) Call for Concerns about No Show Letters and Waiting for Visits   Attempted to call patient. Reached voicemail, left generic voicemail to call back.   If he calls back, please ask him best times to call.   Terisa Starr, MD  [facility]

## 2023-06-18 NOTE — Telephone Encounter (Signed)
Patient calls back. He says anytime before 12 today would be fine.

## 2023-06-18 NOTE — Telephone Encounter (Signed)
Research scientist (physical sciences) Call for Concerns related to letters and wait time  Called patient to discuss concerns related to letters and waiting room. Present on call were patient, Dr. Lum Babe, and myself. Confirmed patient's name and DOB. Asked first for patient's clarification on events described. Engaged in active listening.  He great appreciates Dr. Laroy Apple and Dr. Phineas Real. He GREATLY appreciates care by Dr. Laroy Apple in particular.  Patient voiced concerns about no show letter and wait time in waiting area. We discussed current process and improvements around this.   At end of call, all questions answered.  Terisa Starr, MD  [facility]

## 2023-07-12 ENCOUNTER — Other Ambulatory Visit: Payer: Self-pay | Admitting: Student

## 2023-07-12 DIAGNOSIS — F339 Major depressive disorder, recurrent, unspecified: Secondary | ICD-10-CM

## 2023-07-21 ENCOUNTER — Ambulatory Visit: Payer: Medicare Other

## 2023-07-21 VITALS — Wt 157.0 lb

## 2023-07-21 DIAGNOSIS — Z0000 Encounter for general adult medical examination without abnormal findings: Secondary | ICD-10-CM

## 2023-07-21 NOTE — Patient Instructions (Signed)
Mr. Carpenito , Thank you for taking time to come for your Medicare Wellness Visit. I appreciate your ongoing commitment to your health goals. Please review the following plan we discussed and let me know if I can assist you in the future.   Referrals/Orders/Follow-Ups/Clinician Recommendations: Try to quit smoking.  This is a list of the screening recommended for you and due dates:  Health Maintenance  Topic Date Due   Zoster (Shingles) Vaccine (1 of 2) Never done   COVID-19 Vaccine (1 - 2024-25 season) Never done   Flu Shot  10/20/2023*   Screening for Lung Cancer  10/22/2023   Medicare Annual Wellness Visit  07/20/2024   Colon Cancer Screening  10/12/2032   Hepatitis C Screening  Completed   HIV Screening  Completed   HPV Vaccine  Aged Out   DTaP/Tdap/Td vaccine  Discontinued  *Topic was postponed. The date shown is not the original due date.    Advanced directives: (Declined) Advance directive discussed with you today. Even though you declined this today, please call our office should you change your mind, and we can give you the proper paperwork for you to fill out.  Next Medicare Annual Wellness Visit scheduled for next year: No

## 2023-07-21 NOTE — Progress Notes (Signed)
Subjective:   Lido Frosch is a 57 y.o. male who presents for Medicare Annual/Subsequent preventive examination.  Visit Complete: Virtual I connected with  Audley Hose on 07/21/23 by a audio enabled telemedicine application and verified that I am speaking with the correct person using two identifiers.  Patient Location: Home  Provider Location: Home Office  I discussed the limitations of evaluation and management by telemedicine. The patient expressed understanding and agreed to proceed.  Vital Signs: Because this visit was a virtual/telehealth visit, some criteria may be missing or patient reported. Any vitals not documented were not able to be obtained and vitals that have been documented are patient reported.  Cardiac Risk Factors include: advanced age (>18men, >2 women);sedentary lifestyle;dyslipidemia;family history of premature cardiovascular disease;hypertension;male gender;smoking/ tobacco exposure     Objective:    Today's Vitals   07/21/23 1428  Weight: 157 lb (71.2 kg)  PainSc: 6   PainLoc: Generalized   Body mass index is 22.53 kg/m.     07/21/2023    2:33 PM 05/22/2023   11:00 AM 01/24/2023   10:05 AM 01/08/2023    6:05 PM 01/08/2023    1:11 AM 01/02/2023   12:33 AM 01/01/2023    9:55 PM  Advanced Directives  Does Patient Have a Medical Advance Directive? Yes No No  No No No  Type of Estate agent of [place];Living will        Copy of Healthcare Power of Attorney in Chart? No - copy requested        Would patient like information on creating a medical advance directive? No - Patient declined No - Patient declined No - Patient declined         Information is confidential and restricted. Go to Review Flowsheets to unlock data.    Current Medications (verified) Outpatient Encounter Medications as of 07/21/2023  Medication Sig   albuterol (VENTOLIN HFA) 108 (90 Base) MCG/ACT inhaler Inhale 2 puffs into the lungs every 6 (six) hours as  needed for wheezing or shortness of breath.   busPIRone (BUSPAR) 15 MG tablet Take 1 tablet (15 mg total) by mouth every morning.   Cyanocobalamin (B-12) 1000 MCG TABS TAKE 1 TABLET BY MOUTH DAILY   cyclobenzaprine (FLEXERIL) 10 MG tablet Take 1 tablet (10 mg total) by mouth 3 (three) times daily as needed for muscle spasms.   divalproex (DEPAKOTE ER) 500 MG 24 hr tablet Take 1 tablet (500 mg total) by mouth daily.   gabapentin (NEURONTIN) 100 MG capsule Take 2 capsules (200 mg total) by mouth 3 (three) times daily.   losartan (COZAAR) 100 MG tablet Take 1 tablet (100 mg total) by mouth daily.   Multiple Vitamin (MULTIVITAMIN WITH MINERALS) TABS tablet Take 1 tablet by mouth daily.   pantoprazole (PROTONIX) 40 MG tablet Take 1 tablet (40 mg total) by mouth daily.   Polyethylene Glycol 3350 (PEG 3350) 17 g PACK Take 17 g by mouth daily as needed.   sertraline (ZOLOFT) 100 MG tablet TAKE 1 TABLET (100 MG TOTAL) BY MOUTH DAILY.   traZODone (DESYREL) 50 MG tablet Take 1 tablet (50 mg total) by mouth at bedtime.   umeclidinium-vilanterol (ANORO ELLIPTA) 62.5-25 MCG/ACT AEPB Inhale 1 puff into the lungs daily.   [DISCONTINUED] lurasidone (LATUDA) 40 MG TABS tablet TAKE 1 TABLET(40 MG) BY MOUTH DAILY WITH BREAKFAST   [DISCONTINUED] mometasone-formoterol (DULERA) 100-5 MCG/ACT AERO Inhale 2 puffs into the lungs 2 (two) times daily.   No facility-administered encounter medications on file  as of 07/21/2023.    Allergies (verified) Ambien [zolpidem tartrate], Bactroban [mupirocin calcium], Zestril [lisinopril], Darvon [propoxyphene], and Morphine and codeine   History: Past Medical History:  Diagnosis Date   Acute bronchitis 02/15/2013   Alcohol abuse    Anxiety    Bipolar disorder (HCC)    Bronchitis    history   Bronchitis    Cellulitis    - left knee-2009   Chronic pain    Complication of anesthesia    Condyloma 02/04/2012   Depression    Diverticulosis    By colonoscopy June 2005    Dyspnea    on exertion at times   Elevated CK 09/2011   Emphysema lung (HCC)    Emphysema of lung (HCC)    Family history of anesthesia complication    Mother N/V   GERD (gastroesophageal reflux disease)    Glaucoma syndrome    does not use eye drops, "They burn".   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 Presumably secondary to diverticulosis.   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   Ventral hernia 01/11/2013   Repaired with mesh 05/20/13   Wrist pain 01/26/2019   Past Surgical History:  Procedure Laterality Date   ABDOMINAL EXPLORATION SURGERY  07/23/2012   w/LOA (07/23/2012)   APPENDECTOMY  1982   COLON RESECTION  02/27/2012   Procedure: COLON RESECTION;  Surgeon: Robyne Askew, MD;  Location: [facility] OR;  Service: General;  Laterality: N/A;   COLON RESECTION N/A 10/04/2022   Procedure: SEGMENTAL COLON RESECTION;  Surgeon: Griselda Miner, MD;  Location: [facility] OR;  Service: General;  Laterality: N/A;   COLONOSCOPY  10/13/2022   Procedure: RESECTION OF COLON ANASTOMOSIS AND END COLOSCOPY;  Surgeon: Rodman Pickle, MD;  Location: [facility] OR;  Service: General;;   COLOSTOMY  02/27/2012   Procedure: COLOSTOMY;  Surgeon: Robyne Askew, MD;  Location: [facility] OR;  Service: General;  Laterality: N/A;   COLOSTOMY TAKEDOWN  07/23/2012   COLOSTOMY TAKEDOWN  07/23/2012    Procedure: COLOSTOMY TAKEDOWN;  Surgeon: Robyne Askew, MD;  Location: [facility] OR;  Service: General;  Laterality: N/A;  Primary Anastomosis   ELBOW FRACTURE SURGERY  ~ 1975   left;  pins inserted    EXCISION OF MESH N/A 10/04/2022   Procedure: REMOVAL OF MESH;  Surgeon: Griselda Miner, MD;  Location: [facility] OR;  Service: General;  Laterality: N/A;   INCISION AND DRAINAGE ABSCESS N/A 04/05/2021   Procedure: INCISION AND DRAINAGE ABDOMINAL WALL ABSCESS;  Surgeon: Griselda Miner, MD;  Location: [facility] OR;  Service: General;  Laterality: N/A;   INSERTION OF MESH N/A 05/20/2013   Procedure: INSERTION OF MESH;  Surgeon: Robyne Askew, MD;  Location: [facility] OR;  Service: General;  Laterality: N/A;  IRRIGATION AND DEBRIDEMENT ABSCESS N/A 10/04/2022   Procedure: DRAINAGE ABDOMIAL WALL ABSCESS;  Surgeon: Griselda Miner, MD;  Location: [facility] OR;  Service: General;  Laterality: N/A;   LAPAROSCOPIC LEFT COLON RESECTION  02/19/2012   SIGMOID   LAPAROTOMY  02/27/2012   Procedure: EXPLORATORY LAPAROTOMY;  Surgeon: Robyne Askew, MD;  Location: [facility];  Service: General;  Laterality: N/A;   LAPAROTOMY  07/23/2012   Procedure: EXPLORATORY LAPAROTOMY;  Surgeon: Robyne Askew, MD;  Location: [facility] OR;  Service: General;  Laterality: N/A;   LAPAROTOMY N/A 10/04/2022   Procedure: EXPLORATORY LAPAROTOMY;  Surgeon: Griselda Miner, MD;  Location: [facility] OR;  Service: General;  Laterality: N/A;   LAPAROTOMY N/A 10/13/2022   Procedure: EXPLORATORY LAPAROTOMY;  Surgeon: Rodman Pickle, MD;  Location: [facility] OR;  Service: General;  Laterality: N/A;   LESION DESTRUCTION  07/23/2012   Procedure: DESTRUCTION LESION ANUS;  Surgeon: Robyne Askew, MD;  Location: [facility] OR;  Service: General;  Laterality: N/A;  DESTRUCTION ANAL CONDYLOMA   LYSIS OF ADHESION  07/23/2012   Procedure: LYSIS OF ADHESION;  Surgeon: Robyne Askew, MD;  Location: [facility] OR;  Service: General;  Laterality: N/A;   LYSIS OF ADHESION N/A 10/04/2022   Procedure: LYSIS OF ADHESION;   Surgeon: Griselda Miner, MD;  Location: [facility] OR;  Service: General;  Laterality: N/A;   VENTRAL HERNIA REPAIR  05/20/2013   Dr Rogelio Seen HERNIA REPAIR N/A 05/20/2013   Procedure: VENTRAL HERNIA REPAIR ;  Surgeon: Robyne Askew, MD;  Location: [facility] OR;  Service: General;  Laterality: N/A;   WART FULGURATION  02/19/2012   Procedure: FULGURATION ANAL WART;  Surgeon: Robyne Askew, MD;  Location: [facility] OR;  Service: General;  Laterality: N/A;  Destroy  Anal Condyloma    WISDOM TOOTH EXTRACTION  ~ 1983   WOUND DEBRIDEMENT N/A 06/03/2016   Procedure: ABDOMINAL WOUND EXPLORATION AND STITCH REMOVAL;  Surgeon: Chevis Pretty III, MD;  Location: [facility];  Service: General;  Laterality: N/A;  ABDOMINAL WOUND EXPLORATION AND STITCH REMOVAL   Family History  Problem Relation Age of Onset   Diabetes Mother    Parkinsonism Mother    Depression Mother    Alcohol abuse Father    Hypertension Father    Anxiety disorder Father    Ulcers Father    Colon polyps Father    Breast cancer Maternal Aunt    Diabetes Maternal Grandmother    Stroke Neg Hx    Heart disease Neg Hx    Colon cancer Neg Hx    Stomach cancer Neg Hx    Social History   Socioeconomic History   Marital status: Legally Separated    Spouse name: Not on file   Number of children: 3   Years of education: Not on file   Highest education level: Not on file  Occupational History   Occupation: unemployed  Tobacco Use   Smoking status: Every Day    Current packs/day: 2.00    Average packs/day: 2.0 packs/day for 42.0 years (84.0 ttl pk-yrs)    Types: Cigarettes    Start date: 07/22/1981   Smokeless tobacco: Former    Types: Chew    Quit date: 12/12/2010   Tobacco comments:    Current 2 PPD smoker - Engineer, 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  Unemployed less than high school education.  Updated 10/29/11   Social Drivers of Health   Financial Resource Strain: Low Risk  (07/21/2023)   Overall Financial Resource Strain (CARDIA)    Difficulty of Paying Living Expenses: Not hard at all  Food Insecurity: No Food Insecurity (07/21/2023)   Hunger Vital Sign    Worried About Running Out of Food in the Last Year: Never true    Ran Out of Food in the Last Year: Never true  Transportation Needs: No Transportation Needs (07/21/2023)   PRAPARE - Administrator, Civil Service (Medical): No    Lack of Transportation (Non-Medical): No  Physical Activity: Inactive (07/21/2023)   Exercise Vital Sign    Days of Exercise per Week: 0 days    Minutes of Exercise per Session: 0 min  Stress: No Stress Concern Present (07/21/2023)   [employer] of Occupational Health - Occupational Stress Questionnaire    Feeling of Stress : Not at all  Social Connections: Socially Isolated (07/21/2023)   Social Connection and Isolation Panel [NHANES]    Frequency of Communication with Friends and Family: More than three times a week    Frequency of Social Gatherings with Friends and Family: Three times a week    Attends Religious Services: Never    Active Member of Clubs or Organizations: No    Attends Banker Meetings: Never    Marital Status: Divorced    Tobacco Counseling Ready to quit: Not Answered Counseling given: Not Answered Tobacco comments: Current 2 PPD smoker - Engineer, maintenance (IT) RED   Clinical Intake:  Pre-visit preparation completed: Yes  Pain : 0-10 Pain Score: 6  Pain Type: Chronic pain Pain Location: Generalized     BMI - recorded: 22.53 Nutritional Status: BMI of 19-24  Normal Nutritional Risks:  None Diabetes: No  How often do you need to have someone help you when you read instructions, pamphlets, or other written materials from your doctor or pharmacy?: 1 - Never What is the last grade level you completed in school?: 10TH GRADE  Interpreter Needed?: No  Information entered by :: Susie Cassette, LPN.   Activities of Daily Living    07/21/2023    2:36 PM 01/08/2023    6:09 PM  In your present state of health, do you have any difficulty performing the following activities:  Hearing? 0   Vision? 0   Difficulty concentrating or making decisions? 0   Walking or climbing stairs? 0   Dressing or bathing? 0   Doing errands, shopping? 0   Preparing Food and eating ? N   Using the Toilet? N   In the past six months, have you accidently leaked urine? N   Do you have problems with loss of bowel control? N   Managing your Medications? N   Managing your Finances? N   Housekeeping or managing your Housekeeping? N      Information is confidential and restricted. Go to Review Flowsheets to unlock data.    Patient Care Team: [name], MD as PCP - General (Family Medicine) Corky Crafts, MD as PCP - Cardiology (  Cardiology) Durenda Hurt, MD as Referring Physician (Surgical Oncology)  Indicate any recent Medical Services you may have received from other than [facility] providers in the past year (date may be approximate).     Assessment:   This is a routine wellness examination for [place].  Hearing/Vision screen Hearing Screening - Comments:: Denies hearing difficulties; no hearing aids.   Vision Screening - Comments:: Wears reading glasses - not up to date with routine eye exams with [facility].    Goals Addressed   None   Depression Screen    07/21/2023    2:36 PM 05/27/2023   10:48 AM 05/22/2023   11:00 AM 01/24/2023   10:23 AM 12/18/2022   10:50 AM 11/07/2022    4:00 PM 10/28/2022    1:43 PM  PHQ 2/9 Scores  PHQ - 2 Score 0 2 2  2  0 1  PHQ- 9  Score 0 12 11  11  8   Exception Documentation    Patient refusal       Fall Risk    07/21/2023    2:34 PM 07/17/2022    2:10 PM 10/17/2021   10:41 AM 06/09/2020    1:39 PM 07/05/2019    2:01 PM  Fall Risk   Falls in the past year? 1 1 0 0 0  Number falls in past yr: 1 1 0 0 0  Injury with Fall? 0  0 0   Risk for fall due to : Impaired balance/gait;Other (Comment)      Risk for fall due to: Comment lost of balance      Follow up Falls evaluation completed;Education provided        MEDICARE RISK AT HOME: Medicare Risk at Home Any stairs in or around the home?: Yes If so, are there any without handrails?: No Home free of loose throw rugs in walkways, pet beds, electrical cords, etc?: Yes Adequate lighting in your home to reduce risk of falls?: Yes Life alert?: No Use of a cane, walker or w/c?: No Grab bars in the bathroom?: Yes Shower chair or bench in shower?: No Elevated toilet seat or a handicapped toilet?: No  TIMED UP AND GO:  Was the test performed?  No    Cognitive Function:    07/21/2023    2:41 PM  MMSE - Mini Mental State Exam  Not completed: Unable to complete        07/21/2023    2:35 PM  6CIT Screen  What Year? 0 points  What month? 0 points  What time? 0 points  Count back from 20 0 points  Months in reverse 0 points  Repeat phrase 0 points  Total Score 0 points    Immunizations Immunization History  Administered Date(s) Administered   Pneumococcal Polysaccharide-23 10/29/2011    TDAP Vaccine status: Never done  Flu Vaccine status: Declined, Education has been provided regarding the importance of this vaccine but patient still declined. Advised may receive this vaccine at local pharmacy or Health Dept. Aware to provide a copy of the vaccination record if obtained from local pharmacy or Health Dept. Verbalized acceptance and understanding.  Pneumococcal vaccine status: Declined,  Education has been provided regarding the importance of this  vaccine but patient still declined. Advised may receive this vaccine at local pharmacy or Health Dept. Aware to provide a copy of the vaccination record if obtained from local pharmacy or Health Dept. Verbalized acceptance and understanding.   Covid-19 vaccine status: Declined, Education has been provided regarding  the importance of this vaccine but patient still declined. Advised may receive this vaccine at local pharmacy or Health Dept.or vaccine clinic. Aware to provide a copy of the vaccination record if obtained from local pharmacy or Health Dept. Verbalized acceptance and understanding.  Qualifies for Shingles Vaccine? Yes   Zostavax completed No   Shingrix Completed?: No.    Education has been provided regarding the importance of this vaccine. Patient has been advised to call insurance company to determine out of pocket expense if they have not yet received this vaccine. Advised may also receive vaccine at local pharmacy or Health Dept. Verbalized acceptance and understanding.  Screening Tests Health Maintenance  Topic Date Due   Zoster Vaccines- Shingrix (1 of 2) Never done   COVID-19 Vaccine (1 - 2024-25 season) Never done   INFLUENZA VACCINE  10/20/2023 (Originally 02/20/2023)   Lung Cancer Screening  10/22/2023   Medicare Annual Wellness (AWV)  07/20/2024   Colonoscopy  10/12/2032   Hepatitis C Screening  Completed   HIV Screening  Completed   HPV VACCINES  Aged Out   DTaP/Tdap/Td  Discontinued    Health Maintenance  Health Maintenance Due  Topic Date Due   Zoster Vaccines- Shingrix (1 of 2) Never done   COVID-19 Vaccine (1 - 2024-25 season) Never done    Colorectal cancer screening: Type of screening: Colonoscopy. Completed 10/13/2022. Repeat every 10 years  Lung Cancer Screening: (Low Dose CT Chest recommended if Age 50-80 years, 20 pack-year currently smoking OR have quit w/in 15years.) does qualify.   Lung Cancer Screening Referral: due 10/22/2023  Additional  Screening:  Hepatitis C Screening: does qualify; Completed 06/24/2022  Vision Screening: Recommended annual ophthalmology exams for early detection of glaucoma and other disorders of the eye. Is the patient up to date with their annual eye exam?  No  Who is the provider or what is the name of the office in which the patient attends annual eye exams? Last seen by [facility] If pt is not established with a provider, would they like to be referred to a provider to establish care? No . -Patient declined.  Dental Screening: Recommended annual dental exams for proper oral hygiene  Community Resource Referral / Chronic Care Management: CRR required this visit?  No   CCM required this visit?  No     Plan:     I have personally reviewed and noted the following in the patient's chart:   Medical and social history Use of alcohol, tobacco or illicit drugs  Current medications and supplements including opioid prescriptions. Patient is not currently taking opioid prescriptions.- not listed on medication list. Functional ability and status Nutritional status Physical activity Advanced directives List of other physicians Hospitalizations, surgeries, and ER visits in previous 12 months Vitals Screenings to include cognitive, depression, and falls Referrals and appointments  In addition, I have reviewed and discussed with patient certain preventive protocols, quality metrics, and best practice recommendations. A written personalized care plan for preventive services as well as general preventive health recommendations were provided to patient.     Mickeal Needy, LPN   [id number]   After Visit Summary: (Declined) Due to this being a telephonic visit, with patients personalized plan was offered to patient but patient Declined AVS at this time   Nurse Notes: None at this time.

## 2023-07-28 ENCOUNTER — Ambulatory Visit: Payer: Medicare Other | Admitting: Student

## 2023-07-28 NOTE — Progress Notes (Deleted)
    SUBJECTIVE:   CHIEF COMPLAINT / HPI:   ***  PERTINENT  PMH / PSH: ***  OBJECTIVE:   There were no vitals taken for this visit.  ***  ASSESSMENT/PLAN:   No problem-specific Assessment & Plan notes found for this encounter.     [name], MD [facility]

## 2023-07-31 ENCOUNTER — Ambulatory Visit (INDEPENDENT_AMBULATORY_CARE_PROVIDER_SITE_OTHER): Payer: Medicare Other | Admitting: Family Medicine

## 2023-07-31 ENCOUNTER — Encounter: Payer: Self-pay | Admitting: Family Medicine

## 2023-07-31 VITALS — BP 137/91 | HR 125 | Ht 70.0 in | Wt 165.2 lb

## 2023-07-31 DIAGNOSIS — Z933 Colostomy status: Secondary | ICD-10-CM

## 2023-07-31 DIAGNOSIS — F410 Panic disorder [episodic paroxysmal anxiety] without agoraphobia: Secondary | ICD-10-CM | POA: Diagnosis not present

## 2023-07-31 DIAGNOSIS — I10 Essential (primary) hypertension: Secondary | ICD-10-CM | POA: Diagnosis not present

## 2023-07-31 DIAGNOSIS — F339 Major depressive disorder, recurrent, unspecified: Secondary | ICD-10-CM

## 2023-07-31 DIAGNOSIS — R0600 Dyspnea, unspecified: Secondary | ICD-10-CM | POA: Diagnosis not present

## 2023-07-31 DIAGNOSIS — J449 Chronic obstructive pulmonary disease, unspecified: Secondary | ICD-10-CM

## 2023-07-31 DIAGNOSIS — L089 Local infection of the skin and subcutaneous tissue, unspecified: Secondary | ICD-10-CM | POA: Diagnosis not present

## 2023-07-31 DIAGNOSIS — Z720 Tobacco use: Secondary | ICD-10-CM | POA: Diagnosis not present

## 2023-07-31 MED ORDER — PANTOPRAZOLE SODIUM 40 MG PO TBEC: 40.0000 mg | DELAYED_RELEASE_TABLET | Freq: Every day | ORAL | 0 refills | Status: DC

## 2023-07-31 MED ORDER — SERTRALINE HCL 100 MG PO TABS: 100.0000 mg | ORAL_TABLET | Freq: Every day | ORAL | 1 refills | Status: DC

## 2023-07-31 MED ORDER — LOSARTAN POTASSIUM 100 MG PO TABS: 100.0000 mg | ORAL_TABLET | Freq: Every day | ORAL | 0 refills | Status: DC

## 2023-07-31 MED ORDER — TRAZODONE HCL 50 MG PO TABS: 25.0000 mg | ORAL_TABLET | Freq: Every evening | ORAL | 0 refills | Status: DC | PRN

## 2023-07-31 MED ORDER — BUSPIRONE HCL 15 MG PO TABS: 15.0000 mg | ORAL_TABLET | Freq: Every morning | ORAL | 0 refills | Status: DC

## 2023-07-31 MED ORDER — ALBUTEROL SULFATE HFA 108 (90 BASE) MCG/ACT IN AERS: 2.0000 | INHALATION_SPRAY | Freq: Four times a day (QID) | RESPIRATORY_TRACT | 3 refills | Status: DC | PRN

## 2023-07-31 MED ORDER — ANORO ELLIPTA 62.5-25 MCG/ACT IN AEPB: 1.0000 | INHALATION_SPRAY | Freq: Every day | RESPIRATORY_TRACT | 6 refills | Status: DC

## 2023-07-31 MED ORDER — GABAPENTIN 100 MG PO CAPS: 200.0000 mg | ORAL_CAPSULE | Freq: Three times a day (TID) | ORAL | 1 refills | Status: DC

## 2023-07-31 MED ORDER — DIVALPROEX SODIUM ER 500 MG PO TB24: 500.0000 mg | ORAL_TABLET | Freq: Every day | ORAL | 0 refills | Status: DC

## 2023-07-31 NOTE — Patient Instructions (Signed)
 It was great to see you today! I will refill your medications. I will start you on another inhaler to help with your COPD. We will also get a chest x-ray to further evaluate your breathing. Try to go to a pack and a half a day if possible. Be sure to follow-up with your surgeon to remove the mesh and stitches.

## 2023-07-31 NOTE — Progress Notes (Signed)
    SUBJECTIVE:   CHIEF COMPLAINT / HPI:   Colostomy Doing well. Has a stitch he needs removed. Also has some mesh he needs removed. Not healing like he feels it should. Would like some tape to help tape the bandage back on it.  Refills needed  COPD Would like multiple medications refilled today. Specifically, he needs more albuterol . He has been using it 3-5 puffs, 3-5 times a day. Has been taking the anoro ellipta  in the mornings too. When at rest, does not need to use the albuterol  as much. No chest pain. Continues to smoke 1.5-2 packs of cigarettes per day.  OBJECTIVE:   BP (!) 137/91   Pulse (!) 125   Ht 5' 10 (1.778 m)   Wt 165 lb 3.2 oz (74.9 kg)   SpO2 98%   BMI 23.70 kg/m   General: Alert and oriented, in NAD, hyperactive on exam HEENT: NCAT, EOM grossly normal, midline nasal septum Cardiac: Tachycardic, regular rhythm, no m/r/g appreciated, no BLE edema Respiratory: Mild wheezes and coarse breath sounds in bilateral bases, breathing and speaking comfortably on RA, moving about in room without SOB Abdominal: Ostomy in place with stool present, large open ventral wound with presence of mesh and one stitch at 12 o'clock position though without significant erythema, drainage, or bleeding Extremities: Moves all extremities grossly equally, no BLE pain or erythema Neurological: No gross focal deficit Psychiatric: Appropriate mood and affect   ASSESSMENT/PLAN:   Essential hypertension, benign Uncontrolled though need refill of losartan . Will refill. Follow up at next visit.  Chronic obstructive pulmonary disease (HCC) Increasing albuterol  use when active which helps. Has also been taking his anoro. Reassured overall by lung exam, as mild wheezes/coarse sounds likely due to COPD. However, given increasing albuterol  need, will obtain CXR. Will also obtain CBC to assess for anemia though no bleeding appreciated today on exam. Considered PE with his SOB with exertion and  tachycardia today; however, Well's score is low, he took his albuterol  before coming to the clinic which would have increased HR, and no signs of LE edema/pain. Will add flovent  to regimen for now at 110 mcg 2 puffs BID and assess improvement. Return precautions of worsening symptoms discussed while awaiting lab results.  Tobacco use Discussed detrimental effect of tobacco use on COPD. Through shared decision making, patient will try to smoke a max of 1.5 packs per day in an effort to cut down. Declined pharmacologic management at this time since it has not helped in the past. Follow up at next visit.  Colostomy in place [facility]) Appears to be functioning well. Continue following with surgery.  Chronic wound infection of abdomen Reassuringly does not appear acutely infected today with the absence of worsening erythema, pus, or bleeding. Recommended following with surgery for removal of mesh and stitches.   Health maintenance Consider vaccinations at follow up with PCP. Multiple medications refilled today.  Stuart Redo, MD [facility]

## 2023-08-01 ENCOUNTER — Telehealth: Payer: Self-pay

## 2023-08-01 ENCOUNTER — Telehealth: Payer: Self-pay | Admitting: Family Medicine

## 2023-08-01 ENCOUNTER — Other Ambulatory Visit (HOSPITAL_COMMUNITY): Payer: Self-pay

## 2023-08-01 LAB — CBC
Hematocrit: 41.3 % (ref 37.5–51.0)
Hemoglobin: 13.5 g/dL (ref 13.0–17.7)
MCH: 30.8 pg (ref 26.6–33.0)
MCHC: 32.7 g/dL (ref 31.5–35.7)
MCV: 94 fL (ref 79–97)
Platelets: 479 10*3/uL — ABNORMAL HIGH (ref 150–450)
RBC: 4.38 x10E6/uL (ref 4.14–5.80)
RDW: 12.5 % (ref 11.6–15.4)
WBC: 10.4 10*3/uL (ref 3.4–10.8)

## 2023-08-01 MED ORDER — FLUTICASONE PROPIONATE HFA 110 MCG/ACT IN AERO: 2.0000 | INHALATION_SPRAY | Freq: Two times a day (BID) | RESPIRATORY_TRACT | 12 refills | Status: DC

## 2023-08-01 MED ORDER — LIDOCAINE 5 % EX PTCH: 1.0000 | MEDICATED_PATCH | CUTANEOUS | 0 refills | Status: DC

## 2023-08-01 NOTE — Assessment & Plan Note (Addendum)
 Increasing albuterol  use when active which helps. Has also been taking his anoro. Reassured overall by lung exam, as mild wheezes/coarse sounds likely due to COPD. However, given increasing albuterol  need, will obtain CXR. Will also obtain CBC to assess for anemia though no bleeding appreciated today on exam. Considered PE with his SOB with exertion and tachycardia today; however, Well's score is low, he took his albuterol  before coming to the clinic which would have increased HR, and no signs of LE edema/pain. Will add flovent  to regimen for now at 110 mcg 2 puffs BID and assess improvement. Return precautions of worsening symptoms discussed while awaiting lab results.

## 2023-08-01 NOTE — Assessment & Plan Note (Signed)
 Uncontrolled though need refill of losartan. Will refill. Follow up at next visit.

## 2023-08-01 NOTE — Telephone Encounter (Signed)
 Patient calls nurse line requesting refills on Lidocaine  patches.  He was seen in the office yesterday with Dr. Tharon and forgot to ask for this refill.   He reports that he uses these on his stomach. He has been having surgeries for the last 10 years on his stomach. He states that the lidocaine  patches help with his pain. He does not want to take pills if possible.   Requesting that this prescription be sent to [facility].   Chiquita JAYSON English, RN

## 2023-08-01 NOTE — Assessment & Plan Note (Signed)
 Appears to be functioning well. Continue following with surgery.

## 2023-08-01 NOTE — Telephone Encounter (Signed)
 Called patient to discuss lab results. No anemia. Platelets slightly elevated as in the past; consider chronic inflammation from healing abdominal wound.   Patient is feeling good this evening. Has not yet picked up albuterol  refill or flovent . He would like his lidocaine  patches refilled too, which I have sent in. Advised to pick up at earliest convenience. Discussed getting CXR done, as well, and he will get this done on Monday. Reassuringly no SOB or discomfort while speaking with me on the phone.

## 2023-08-01 NOTE — Assessment & Plan Note (Signed)
 Reassuringly does not appear acutely infected today with the absence of worsening erythema, pus, or bleeding. Recommended following with surgery for removal of mesh and stitches.

## 2023-08-01 NOTE — Assessment & Plan Note (Signed)
 Discussed detrimental effect of tobacco use on COPD. Through shared decision making, patient will try to smoke a max of 1.5 packs per day in an effort to cut down. Declined pharmacologic management at this time since it has not helped in the past. Follow up at next visit.

## 2023-08-04 ENCOUNTER — Telehealth: Payer: Self-pay

## 2023-08-04 ENCOUNTER — Other Ambulatory Visit (HOSPITAL_COMMUNITY): Payer: Self-pay

## 2023-08-04 NOTE — Telephone Encounter (Signed)
 Pharmacy Patient Advocate Encounter   Received notification from CoverMyMeds that prior authorization for FLOVENT  is required/requested.   Insurance verification completed.   The patient is insured through [facility] .   PA required; PA submitted to above mentioned insurance via CoverMyMeds Key/confirmation #/[id number]. Status is pending

## 2023-08-04 NOTE — Telephone Encounter (Signed)
 Pharmacy Patient Advocate Encounter   Received notification from CoverMyMeds that prior authorization for FLOVENT  is required/requested.   The patient is insured through [facility] .   PA required; PA started via CoverMyMeds. KEY [id number] . Waiting for clinical questions to populate.

## 2023-08-05 NOTE — Telephone Encounter (Signed)
 Pharmacy Patient Advocate Encounter  Received notification from [facility] that Prior Authorization for FLOVENT has been APPROVED from 08/05/23 to 07/21/98   PA #/Case ID/Reference #: [id number]

## 2023-08-07 NOTE — Telephone Encounter (Signed)
Error - duplicate - approved.

## 2023-08-14 ENCOUNTER — Telehealth: Payer: Self-pay

## 2023-08-14 NOTE — Telephone Encounter (Signed)
Patient calls nurse line in regards to Lidocaine Patches.  These were sent in on 1/10, however he reports he has not been able to get them.   I called the pharmacy and a PA is needed.

## 2023-08-22 ENCOUNTER — Other Ambulatory Visit (HOSPITAL_COMMUNITY): Payer: Self-pay

## 2023-08-22 ENCOUNTER — Other Ambulatory Visit: Payer: Self-pay | Admitting: Student

## 2023-08-22 DIAGNOSIS — L02211 Cutaneous abscess of abdominal wall: Secondary | ICD-10-CM

## 2023-08-26 NOTE — Telephone Encounter (Signed)
 PA submitted via covermymeds.

## 2023-08-27 ENCOUNTER — Ambulatory Visit: Payer: Medicare Other | Admitting: Student

## 2023-08-27 NOTE — Telephone Encounter (Signed)
 Pharmacy Patient Advocate Encounter  Received notification from [employer] that Prior Authorization for LIDOCAINE  PATCHES has been DENIED.       LIDOCAINE  4% PATCHES ARE AVAILABLE OTC  PA #/Case ID/Reference #: [id number]

## 2023-10-09 ENCOUNTER — Other Ambulatory Visit: Payer: Self-pay | Admitting: Family Medicine

## 2023-10-14 ENCOUNTER — Ambulatory Visit: Admitting: Student

## 2023-10-14 ENCOUNTER — Encounter: Payer: Self-pay | Admitting: Student

## 2023-10-14 DIAGNOSIS — F1090 Alcohol use, unspecified, uncomplicated: Secondary | ICD-10-CM | POA: Diagnosis not present

## 2023-10-14 DIAGNOSIS — F1994 Other psychoactive substance use, unspecified with psychoactive substance-induced mood disorder: Secondary | ICD-10-CM

## 2023-10-14 DIAGNOSIS — M25532 Pain in left wrist: Secondary | ICD-10-CM

## 2023-10-14 DIAGNOSIS — I10 Essential (primary) hypertension: Secondary | ICD-10-CM

## 2023-10-14 DIAGNOSIS — F410 Panic disorder [episodic paroxysmal anxiety] without agoraphobia: Secondary | ICD-10-CM | POA: Diagnosis not present

## 2023-10-14 DIAGNOSIS — F3162 Bipolar disorder, current episode mixed, moderate: Secondary | ICD-10-CM | POA: Diagnosis not present

## 2023-10-14 DIAGNOSIS — R000 Tachycardia, unspecified: Secondary | ICD-10-CM | POA: Diagnosis present

## 2023-10-14 NOTE — Assessment & Plan Note (Signed)
 Daily alcohol consumption with family history of alcoholism. Discussed health impacts and relation to recent events. - ETOH level (unable to obtain d/t cost)

## 2023-10-14 NOTE — Assessment & Plan Note (Addendum)
 On Depakote, Buspar, and sertraline, taken regularly per patient. Somewhat concerning for mania versus substance induced mood issue - Continue psychiatric medications as prescribed - UDS

## 2023-10-14 NOTE — Patient Instructions (Addendum)
 It was great to see you! Thank you for allowing me to participate in your care!   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  [name], MD
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
## 2023-10-15 ENCOUNTER — Ambulatory Visit
Admission: RE | Admit: 2023-10-15 | Discharge: 2023-10-15 | Disposition: A | Discharge status: Home / Self Care | Source: Ambulatory Visit | Attending: Family Medicine | Admitting: Family Medicine

## 2023-10-15 DIAGNOSIS — M25532 Pain in left wrist: Secondary | ICD-10-CM

## 2023-10-15 LAB — BASIC METABOLIC PANEL
BUN/Creatinine Ratio: 14 (ref 9–20)
BUN: 12 mg/dL (ref 6–24)
CO2: 23 mmol/L (ref 20–29)
Calcium: 9.5 mg/dL (ref 8.7–10.2)
Chloride: 100 mmol/L (ref 96–106)
Creatinine, Ser: 0.83 mg/dL (ref 0.76–1.27)
Glucose: 76 mg/dL (ref 70–99)
Potassium: 4.4 mmol/L (ref 3.5–5.2)
Sodium: 143 mmol/L (ref 134–144)
eGFR: 103 mL/min/{1.73_m2} (ref >59)

## 2023-10-16 ENCOUNTER — Telehealth: Payer: Self-pay | Admitting: Student

## 2023-10-16 LAB — TOXASSURE SELECT 13 (MW), URINE

## 2023-10-16 NOTE — Telephone Encounter (Signed)
 Patient requesting a call from [facility] regarding his x-rays. He is now wondering if he should go get a scan of his head because he's now dealing with dizziness. Please advise.

## 2023-10-17 ENCOUNTER — Encounter: Payer: Self-pay | Admitting: Student

## 2023-10-17 NOTE — Telephone Encounter (Signed)
 Called patient x 3 nor response and unable to reach VM. IF calls back and symptomatics with dizziness,headaches, vomiting I cannot do neurologic exam over phone. Would recommend ED evaluation if symptomatic and have neurologic deficits. Concern would be a subdural.  His labs overall are normal but did have cocaine and ethanol in his UDS likely contributing to his demeanor and vitals at that visit.  Plan discussed with Dr. Deirdre Priest

## 2023-10-20 NOTE — Telephone Encounter (Signed)
 Patient returns call to nurse line. He reports intermittent dizziness and headache for the last three days. He states that dizziness has improved.   Denies visual changes. He is speaking in complete sentences.   He also reports that when he squeezes his left hand, he has pain that radiates up to his elbow.   Patient states that he is not going to go to the ED unless absolutely necessary. Advised patient that he would need an in person evaluation for these concerns. Scheduled in ATC tomorrow morning.   ED precautions discussed.   Veronda Prude, RN

## 2023-10-21 ENCOUNTER — Ambulatory Visit

## 2023-10-21 ENCOUNTER — Encounter: Payer: Self-pay | Admitting: Student

## 2023-10-21 DIAGNOSIS — R42 Dizziness and giddiness: Secondary | ICD-10-CM | POA: Diagnosis present
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
## 2023-10-21 NOTE — Patient Instructions (Addendum)
 It was great to see you today! Thank you for choosing [facility] for your primary care.  Today we addressed: Consider EMG NCS for the wrist  Tylenol for pain Monitor dizziness and call if happens again  Return in 1 month   If you haven't already, sign up for My Chart to have easy access to your labs results, and communication with your primary care physician.   Please arrive 15 minutes before your appointment to ensure smooth check in process.  We appreciate your efforts in making this happen.  Thank you for allowing me to participate in your care, Alfredo Martinez, MD 10/21/2023, 10:32 AM PGY-3, [facility]
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
## 2023-10-31 ENCOUNTER — Other Ambulatory Visit (HOSPITAL_COMMUNITY): Payer: Self-pay

## 2023-11-24 ENCOUNTER — Ambulatory Visit (INDEPENDENT_AMBULATORY_CARE_PROVIDER_SITE_OTHER): Admitting: Student

## 2023-11-24 ENCOUNTER — Encounter: Payer: Self-pay | Admitting: Student

## 2023-11-24 DIAGNOSIS — R202 Paresthesia of skin: Secondary | ICD-10-CM

## 2023-11-24 DIAGNOSIS — R200 Anesthesia of skin: Secondary | ICD-10-CM
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
## 2023-11-24 NOTE — Patient Instructions (Addendum)
 It was great to see you today! Thank you for choosing [facility] for your primary care.  Today we addressed: We will obtain an EMG - this will be a nerve conduction study to assess  They will call to schedule this  Please call if symptoms get worse  Could try 'cock up splint' from [facility] at night time     If you haven't already, sign up for My Chart to have easy access to your labs results, and communication with your primary care physician.  Please arrive 15 minutes before your appointment to ensure smooth check in process.  We appreciate your efforts in making this happen.  Thank you for allowing me to participate in your care, Ernestina Headland, MD 11/24/2023, 9:22 AM PGY-3, [facility]
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
## 2023-12-02 ENCOUNTER — Other Ambulatory Visit: Payer: Self-pay | Admitting: Student

## 2023-12-02 DIAGNOSIS — R202 Paresthesia of skin: Secondary | ICD-10-CM

## 2023-12-11 ENCOUNTER — Other Ambulatory Visit: Payer: Self-pay

## 2023-12-11 ENCOUNTER — Encounter: Payer: Self-pay | Admitting: Neurology

## 2023-12-11 DIAGNOSIS — R202 Paresthesia of skin: Secondary | ICD-10-CM

## 2023-12-11 NOTE — Addendum Note (Signed)
 Addended by: Rox Cope A on: 12/11/2023 10:46 AM   Modules accepted: Orders

## 2023-12-14 ENCOUNTER — Other Ambulatory Visit: Payer: Self-pay

## 2023-12-14 ENCOUNTER — Encounter (HOSPITAL_COMMUNITY): Payer: Self-pay

## 2023-12-14 ENCOUNTER — Emergency Department (HOSPITAL_COMMUNITY)

## 2023-12-14 ENCOUNTER — Emergency Department (HOSPITAL_COMMUNITY)
Admission: EM | Admit: 2023-12-14 | Discharge: 2023-12-14 | Disposition: A | Discharge status: Home / Self Care | Attending: Emergency Medicine | Admitting: Emergency Medicine

## 2023-12-14 DIAGNOSIS — K432 Incisional hernia without obstruction or gangrene: Secondary | ICD-10-CM | POA: Diagnosis not present

## 2023-12-14 DIAGNOSIS — J439 Emphysema, unspecified: Secondary | ICD-10-CM | POA: Diagnosis not present

## 2023-12-14 DIAGNOSIS — F32A Depression, unspecified: Secondary | ICD-10-CM | POA: Diagnosis not present

## 2023-12-14 DIAGNOSIS — F10130 Alcohol abuse with withdrawal, uncomplicated: Secondary | ICD-10-CM | POA: Diagnosis not present

## 2023-12-14 DIAGNOSIS — R109 Unspecified abdominal pain: Secondary | ICD-10-CM | POA: Insufficient documentation

## 2023-12-14 DIAGNOSIS — R251 Tremor, unspecified: Secondary | ICD-10-CM | POA: Diagnosis present

## 2023-12-14 DIAGNOSIS — J449 Chronic obstructive pulmonary disease, unspecified: Secondary | ICD-10-CM | POA: Insufficient documentation

## 2023-12-14 DIAGNOSIS — E860 Dehydration: Secondary | ICD-10-CM | POA: Insufficient documentation

## 2023-12-14 DIAGNOSIS — F419 Anxiety disorder, unspecified: Secondary | ICD-10-CM | POA: Diagnosis not present

## 2023-12-14 DIAGNOSIS — Z933 Colostomy status: Secondary | ICD-10-CM | POA: Insufficient documentation

## 2023-12-14 DIAGNOSIS — R0600 Dyspnea, unspecified: Secondary | ICD-10-CM | POA: Insufficient documentation

## 2023-12-14 DIAGNOSIS — Z7951 Long term (current) use of inhaled steroids: Secondary | ICD-10-CM | POA: Insufficient documentation

## 2023-12-14 DIAGNOSIS — F10930 Alcohol use, unspecified with withdrawal, uncomplicated: Secondary | ICD-10-CM

## 2023-12-14 LAB — D-DIMER, QUANTITATIVE: D-Dimer, Quant: <0.27 ug{FEU}/mL (ref 0.00–0.50)

## 2023-12-14 LAB — CBC WITH DIFFERENTIAL/PLATELET
Abs Immature Granulocytes: 0.03 10*3/uL (ref 0.00–0.07)
Basophils Absolute: 0 10*3/uL (ref 0.0–0.1)
Basophils Relative: 0 %
Eosinophils Absolute: 0 10*3/uL (ref 0.0–0.5)
Eosinophils Relative: 0 %
HCT: 43.3 % (ref 39.0–52.0)
Hemoglobin: 14.8 g/dL (ref 13.0–17.0)
Immature Granulocytes: 0 %
Lymphocytes Relative: 10 %
Lymphs Abs: 1 10*3/uL (ref 0.7–4.0)
MCH: 33.3 pg (ref 26.0–34.0)
MCHC: 34.2 g/dL (ref 30.0–36.0)
MCV: 97.3 fL (ref 80.0–100.0)
Monocytes Absolute: 0.5 10*3/uL (ref 0.1–1.0)
Monocytes Relative: 5 %
Neutro Abs: 8.5 10*3/uL — ABNORMAL HIGH (ref 1.7–7.7)
Neutrophils Relative %: 85 %
Platelets: 409 10*3/uL — ABNORMAL HIGH (ref 150–400)
RBC: 4.45 MIL/uL (ref 4.22–5.81)
RDW: 13.5 % (ref 11.5–15.5)
WBC: 10.1 10*3/uL (ref 4.0–10.5)
nRBC: 0 % (ref 0.0–0.2)

## 2023-12-14 LAB — ETHANOL: Alcohol, Ethyl (B): <15 mg/dL (ref <15)

## 2023-12-14 LAB — COMPREHENSIVE METABOLIC PANEL WITH GFR
ALT: 20 U/L (ref 0–44)
AST: 43 U/L — ABNORMAL HIGH (ref 15–41)
Albumin: 3.9 g/dL (ref 3.5–5.0)
Alkaline Phosphatase: 71 U/L (ref 38–126)
Anion gap: 15 (ref 5–15)
BUN: 13 mg/dL (ref 6–20)
CO2: 21 mmol/L — ABNORMAL LOW (ref 22–32)
Calcium: 9.2 mg/dL (ref 8.9–10.3)
Chloride: 101 mmol/L (ref 98–111)
Creatinine, Ser: 1.28 mg/dL — ABNORMAL HIGH (ref 0.61–1.24)
GFR, Estimated: >60 mL/min (ref >60)
Glucose, Bld: 197 mg/dL — ABNORMAL HIGH (ref 70–99)
Potassium: 3.8 mmol/L (ref 3.5–5.1)
Sodium: 137 mmol/L (ref 135–145)
Total Bilirubin: 0.6 mg/dL (ref 0.0–1.2)
Total Protein: 7 g/dL (ref 6.5–8.1)

## 2023-12-14 LAB — I-STAT CG4 LACTIC ACID, ED: Lactic Acid, Venous: 1.9 mmol/L (ref 0.5–1.9)

## 2023-12-14 LAB — BRAIN NATRIURETIC PEPTIDE: B Natriuretic Peptide: 11.7 pg/mL (ref 0.0–100.0)

## 2023-12-14 MED ORDER — LORAZEPAM 2 MG/ML IJ SOLN
1.0000 mg | Freq: Once | INTRAMUSCULAR | Status: AC
  Administered 2023-12-14: 1 mg via INTRAVENOUS
  Filled 2023-12-14: qty 1

## 2023-12-14 MED ORDER — ONDANSETRON HCL 4 MG PO TABS: 4.0000 mg | ORAL_TABLET | Freq: Four times a day (QID) | ORAL | 0 refills | Status: DC

## 2023-12-14 MED ORDER — SODIUM CHLORIDE 0.9 % IV BOLUS
1000.0000 mL | Freq: Once | INTRAVENOUS | Status: AC
  Administered 2023-12-14: 1000 mL via INTRAVENOUS

## 2023-12-14 MED ORDER — IOHEXOL 350 MG/ML SOLN
75.0000 mL | Freq: Once | INTRAVENOUS | Status: AC | PRN
Start: 2023-12-14 — End: 2023-12-14
  Administered 2023-12-14: 75 mL via INTRAVENOUS

## 2023-12-14 MED ORDER — CHLORDIAZEPOXIDE HCL 25 MG PO CAPS: ORAL_CAPSULE | ORAL | 0 refills | Status: DC

## 2023-12-14 NOTE — ED Notes (Signed)
 Lab notified of blood work ordered.
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
## 2023-12-14 NOTE — Discharge Instructions (Signed)
 You were seen in the emergency department for your shortness of breath and your nausea.  Your workup showed no signs of infection, you were mildly dehydrated.  Your symptoms are likely due to alcohol withdrawal.  Have given you prescription for medication of Librium that you should take as prescribed to help prevent further alcohol withdrawal symptoms and that can help you in stopping drinking.  You can take Zofran  as needed for nausea.  You can follow-up with your primary doctor or have given you a list of outpatient resources for additional help with stopping alcohol.  You should return to the emergency department if you are continuing to vomit despite the nausea medicine, you have a seizure or if you have any other new or concerning symptoms.

## 2023-12-14 NOTE — ED Notes (Signed)
 Back from CT, alert, NAD, calm, no changes. VSS.

## 2023-12-14 NOTE — ED Notes (Signed)
 EDP at [facility]

## 2023-12-14 NOTE — ED Provider Notes (Signed)
 Patient signed out to me at 1530 by Dr. Aveline pending CT imaging.  In short this is a 58 year old male with a past medical history of alcohol abuse, COPD and multiple complicated abdominal surgeries presented to the emergency department with dizziness, shortness of breath and tremors.  His last drink was 2 days ago.  He was tachycardic and hypertensive on arrival.  Was treated with Ativan  with improvement of his symptoms.  Did have some abdominal tenderness.  Had workup performed to evaluate for infection or other etiology as cause of his symptoms.  Patient's labs showed mild increase of his creatinine otherwise no acute abnormalities on labs, he had CT imaging performed that showed no acute abnormality.  Suspect symptoms are likely due to alcohol withdrawal.  On my evaluation, the patient is awake, removing his monitor stickers and stating that he is ready to go.  The patient will be given Librium taper and was given outpatient resources.  He was given strict return precautions.   Kingsley, Lenetta Piche K, DO 12/14/23 1744

## 2023-12-14 NOTE — ED Notes (Signed)
 Xray at [facility]

## 2023-12-14 NOTE — ED Provider Notes (Signed)
 [place] EMERGENCY DEPARTMENT AT [facility] Provider Note   CSN: [id number] Arrival date & time: 12/14/23  1244     History  No chief complaint on file.   Timothy [name] is a 58 y.o. male.  Pt is a 58 yo male with pmhx significant for gerd, hx gi bleed, anxiety, etoh abuse, depression, copd, and complicated abdominal surgical hx.  Pt initially required a colectomy for diverticulitis.  He developed a leak which required an additional resection.  He had an ostomy that was reversed and had a mesh placed at an incisional hernia.  He has had infection of the mesh and has had several I&ds of the mesh.  He had another surgery which developed complications and he required a colostomy.  Pt presents to the ED today with dizziness, sob, tremors.  He does drink daily, last drank 2 days ago.         Home Medications Prior to Admission medications   Medication Sig Start Date End Date Taking? Authorizing Provider  albuterol  (VENTOLIN  HFA) 108 (90 Base) MCG/ACT inhaler Inhale 2 puffs into the lungs every 6 (six) hours as needed for wheezing or shortness of breath. 07/31/23   Dema Filler, MD  busPIRone  (BUSPAR ) 15 MG tablet Take 1 tablet (15 mg total) by mouth every morning. 07/31/23   Dema Filler, MD  Cyanocobalamin  (B-12) 1000 MCG TABS TAKE 1 TABLET BY MOUTH DAILY 07/14/23   Glenn Lange, DO  cyclobenzaprine  (FLEXERIL ) 10 MG tablet TAKE 1 TABLET BY MOUTH 3 TIMES DAILY AS NEEDED FOR MUSCLE SPASMS. 08/22/23   Genora Kidd, MD  divalproex  (DEPAKOTE  ER) 500 MG 24 hr tablet TAKE 1 TABLET (500 MG TOTAL) BY MOUTH DAILY. 10/09/23   Genora Kidd, MD  fluticasone  (FLOVENT  HFA) 110 MCG/ACT inhaler Inhale 2 puffs into the lungs 2 (two) times daily. 08/01/23   Dema Filler, MD  gabapentin  (NEURONTIN ) 100 MG capsule Take 2 capsules (200 mg total) by mouth 3 (three) times daily. 07/31/23   Dema Filler, MD  lidocaine  (LIDODERM ) 5 % Place 1 patch onto the skin daily. Remove & Discard patch within 12  hours or as directed by MD 08/01/23   Dema Filler, MD  losartan  (COZAAR ) 100 MG tablet Take 1 tablet (100 mg total) by mouth daily. 07/31/23   Dema Filler, MD  Multiple Vitamin (MULTIVITAMIN WITH MINERALS) TABS tablet Take 1 tablet by mouth daily. 05/22/23   Genora Kidd, MD  pantoprazole  (PROTONIX ) 40 MG tablet TAKE 1 TABLET (40 MG TOTAL) BY MOUTH DAILY. 10/09/23   Genora Kidd, MD  Polyethylene Glycol 3350  (PEG 3350 ) 17 g PACK Take 17 g by mouth daily as needed. 05/22/23   Genora Kidd, MD  sertraline  (ZOLOFT ) 100 MG tablet Take 1 tablet (100 mg total) by mouth daily. 07/31/23   Dema Filler, MD  traZODone  (DESYREL ) 50 MG tablet Take 0.5 tablets (25 mg total) by mouth at bedtime as needed for sleep. 07/31/23   Dema Filler, MD  umeclidinium-vilanterol (ANORO ELLIPTA ) 62.5-25 MCG/ACT AEPB Inhale 1 puff into the lungs daily. 07/31/23   Dema Filler, MD  lurasidone  (LATUDA ) 40 MG TABS tablet TAKE 1 TABLET(40 MG) BY MOUTH DAILY WITH BREAKFAST 05/12/19 05/08/20  Lacinda Pica, MD  mometasone -formoterol  (DULERA ) 100-5 MCG/ACT AERO Inhale 2 puffs into the lungs 2 (two) times daily. 02/22/19 05/19/20  Lacinda Pica, MD      Allergies    Ambien  [zolpidem  tartrate], Bactroban  [mupirocin  calcium ], Zestril  [lisinopril ], Darvon [propoxyphene], and Morphine  and codeine    Review of  Systems   Review of Systems  Respiratory:  Positive for shortness of breath.   Gastrointestinal:  Positive for abdominal pain.  Neurological:  Positive for dizziness and weakness.  All other systems reviewed and are negative.   Physical Exam Updated Vital Signs BP 108/73   Pulse 96   Temp 98.4 F (36.9 C) (Oral)   Resp (!) 27   SpO2 99%  Physical Exam Vitals and nursing note reviewed.  Constitutional:      Appearance: Normal appearance. He is ill-appearing.  HENT:     Head: Normocephalic and atraumatic.     Right Ear: External ear normal.     Left Ear: External ear normal.     Nose: Nose normal.      Mouth/Throat:     Mouth: Mucous membranes are dry.     Pharynx: Oropharynx is clear.  Eyes:     Extraocular Movements: Extraocular movements intact.     Conjunctiva/sclera: Conjunctivae normal.     Pupils: Pupils are equal, round, and reactive to light.  Cardiovascular:     Rate and Rhythm: Regular rhythm. Tachycardia present.     Pulses: Normal pulses.     Heart sounds: Normal heart sounds.  Pulmonary:     Effort: Tachypnea present.  Abdominal:     General: Abdomen is flat. Bowel sounds are normal.     Palpations: Abdomen is soft.     Comments: Tenderness over incisional hernia mesh site.  Colostomy noted.  Musculoskeletal:        General: Normal range of motion.     Cervical back: Normal range of motion and neck supple.  Skin:    General: Skin is warm.     Capillary Refill: Capillary refill takes less than 2 seconds.  Neurological:     General: No focal deficit present.     Mental Status: He is alert and oriented to person, place, and time.  Psychiatric:        Mood and Affect: Mood is anxious.     ED Results / Procedures / Treatments   Labs (all labs ordered are listed, but only abnormal results are displayed) Labs Reviewed  CBC WITH DIFFERENTIAL/PLATELET - Abnormal; Notable for the following components:      Result Value   Platelets 409 (*)    Neutro Abs 8.5 (*)    All other components within normal limits  COMPREHENSIVE METABOLIC PANEL WITH GFR - Abnormal; Notable for the following components:   CO2 21 (*)    Glucose, Bld 197 (*)    Creatinine, Ser 1.28 (*)    AST 43 (*)    All other components within normal limits  CULTURE, BLOOD (ROUTINE X 2)  CULTURE, BLOOD (ROUTINE X 2)  RESP PANEL BY RT-PCR (RSV, FLU A&B, COVID)  RVPGX2  BRAIN NATRIURETIC PEPTIDE  D-DIMER, QUANTITATIVE  ETHANOL  I-STAT CG4 LACTIC ACID, ED    EKG EKG Interpretation Date/Time:  Sunday Dec 14 2023 12:53:29 EDT Ventricular Rate:  122 PR Interval:    QRS Duration:  92 QT  Interval:  333 QTC Calculation: 475 R Axis:   94  Text Interpretation: Sinus tachycardia Confirmed by Sueellen Emery [phone]) on 12/14/2023 2:53:31 PM  Radiology DG Chest Port 1 View Result Date: 12/14/2023 CLINICAL DATA:  Shortness of breath and dizziness 3 days. Recent increased ETOH use. EXAM: PORTABLE CHEST 1 VIEW COMPARISON:  10/22/2022 FINDINGS: Lungs are hyperexpanded without airspace consolidation or effusion. Cardiomediastinal silhouette and remainder of the exam is unchanged. IMPRESSION: No active  disease. Electronically Signed   By: Roda Cirri M.D.   On: 12/14/2023 13:51    Procedures Procedures    Medications Ordered in ED Medications  LORazepam  (ATIVAN ) injection 1 mg (has no administration in time range)  LORazepam  (ATIVAN ) injection 1 mg (1 mg Intravenous Given 12/14/23 1305)  sodium chloride  0.9 % bolus 1,000 mL (1,000 mLs Intravenous New Bag/Given 12/14/23 1521)    ED Course/ Medical Decision Making/ A&P                                 Medical Decision Making Amount and/or Complexity of Data Reviewed Labs: ordered. Radiology: ordered.  Risk Prescription drug management.   This patient presents to the ED for concern of sob, this involves an extensive number of treatment options, and is a complaint that carries with it a high risk of complications and morbidity.  The differential diagnosis includes anxiety, pna, covid/flu/rsv, etoh w/dr, electrolyte abn, infection   Co morbidities that complicate the patient evaluation  gerd, hx gi bleed, anxiety, etoh abuse, depression, copd, and complicated abdominal surgical hx.   Additional history obtained:  Additional history obtained from epic chart review  Lab Tests:  I Ordered, and personally interpreted labs.  The pertinent results include:  cbc nl, ddimer neg, cmp nl other than glucose sl elevated at 197, cr sl elevated at 1.28, bnp nl   Imaging Studies ordered:  I ordered imaging studies including cxr,  ct head, chest/abd/pelvis  I independently visualized and interpreted imaging which showed  CXR: No active disease.  I agree with the radiologist interpretation   Cardiac Monitoring:  The patient was maintained on a cardiac monitor.  I personally viewed and interpreted the cardiac monitored which showed an underlying rhythm of: st   Medicines ordered and prescription drug management:  I ordered medication including ivfs/ativan   for sx  Reevaluation of the patient after these medicines showed that the patient improved I have reviewed the patients home medicines and have made adjustments as needed   Test Considered:  ct   Critical Interventions:  Ivfs/ativan   Problem List / ED Course:  SOB and tremors:  I suspect this is from etoh w/dr.  However, pt has risk factors for several other problems.  CT scans are pending at shift change.  He is feeling batter after ativan .  Pt signed out.   Reevaluation:  After the interventions noted above, I reevaluated the patient and found that they have :improved   Social Determinants of Health:  Lives at home   Dispostion:  Pending at shift change        Final Clinical Impression(s) / ED Diagnoses Final diagnoses:  Alcohol withdrawal syndrome without complication (HCC)  Dehydration  Dyspnea, unspecified type    Rx / DC Orders ED Discharge Orders     None         Sueellen Emery, MD 12/14/23 1544

## 2023-12-14 NOTE — ED Notes (Signed)
 Calmer, breathing easier, mild HA, 3/10.Timothy [name]

## 2023-12-14 NOTE — ED Triage Notes (Signed)
 Pt c/o dizziness , unsteady on feet x 3 days; today endorsing sob and tremors; hx copd; endorses drinking 6-7 beers every other day; last drank 2 days ago

## 2023-12-16 ENCOUNTER — Other Ambulatory Visit: Payer: Self-pay | Admitting: Family Medicine

## 2023-12-16 DIAGNOSIS — I10 Essential (primary) hypertension: Secondary | ICD-10-CM

## 2023-12-17 ENCOUNTER — Other Ambulatory Visit: Payer: Self-pay | Admitting: Family Medicine

## 2023-12-19 ENCOUNTER — Ambulatory Visit: Admitting: Neurology

## 2023-12-19 DIAGNOSIS — R202 Paresthesia of skin: Secondary | ICD-10-CM | POA: Diagnosis not present

## 2023-12-19 LAB — CULTURE, BLOOD (ROUTINE X 2): Culture: NO GROWTH

## 2023-12-19 NOTE — Procedures (Signed)
  [facility] Neurology  [address] [place], [address]  [place], [place] [address] Tel: [phone] Fax: [phone] Test Date:  12/19/2023  Patient: Timothy [name] DOB: 1966/05/06 Physician: Reyna Cava, DO  Sex: Male Height: 5\' 10"  Ref Phys: Ernestina Headland, MD  ID#: [id number]   Technician:    History: This is a 58 year old man referred for evaluation of left hand numbness.  NCV & EMG Findings: Electrodiagnostic testing was limited and prematurely terminated due to pain.  Findings show:  Left median and ulnar sensory responses show prolonged distal peak latency (L4.0, L3.5 ms) and reduced amplitude (L13.1, L5.9 V).  Left radial sensory response is within normal limits.   Left median motor response is within normal limits.    Impression: This is an incomplete study as testing was terminated due to pain.  Abnormalities in the left median response may suggest left carpal tunnel syndrome.  Correlate clinically.     ___________________________ Reyna Cava, DO    Nerve Conduction Studies   Stim Site NR Peak (ms) Norm Peak (ms) O-P Amp (V) Norm O-P Amp  Left Median Anti Sensory (2nd Digit)  32 C  Wrist    *4.0 <3.6 *13.1 >15  Left Radial Anti Sensory (Base 1st Digit)  32 C  Wrist    2.6 <2.7 20.6 >14  Left Ulnar Anti Sensory (5th Digit)  32 C  Wrist    *3.5 <3.1 *5.9 >10     Stim Site NR Onset (ms) Norm Onset (ms) O-P Amp (mV) Norm O-P Amp Site1 Site2 Delta-0 (ms) Dist (cm) Vel (m/s) Norm Vel (m/s)  Left Median Motor (Abd Poll Brev)  32 C  Wrist    2.9 <4.0 8.0 >6 Elbow Wrist 6.0 31.0 52 >50  Elbow    8.9  8.4             Waveforms:

## 2023-12-20 ENCOUNTER — Ambulatory Visit: Payer: Self-pay | Admitting: Student

## 2023-12-22 ENCOUNTER — Ambulatory Visit (INDEPENDENT_AMBULATORY_CARE_PROVIDER_SITE_OTHER): Admitting: Student

## 2023-12-22 ENCOUNTER — Encounter: Payer: Self-pay | Admitting: Student

## 2023-12-22 VITALS — BP 114/85 | HR 88 | Ht 70.0 in | Wt 153.0 lb

## 2023-12-22 DIAGNOSIS — F1090 Alcohol use, unspecified, uncomplicated: Secondary | ICD-10-CM

## 2023-12-22 DIAGNOSIS — R200 Anesthesia of skin: Secondary | ICD-10-CM

## 2023-12-22 DIAGNOSIS — R202 Paresthesia of skin: Secondary | ICD-10-CM | POA: Diagnosis not present

## 2023-12-22 DIAGNOSIS — F410 Panic disorder [episodic paroxysmal anxiety] without agoraphobia: Secondary | ICD-10-CM | POA: Diagnosis not present

## 2023-12-22 MED ORDER — ONDANSETRON HCL 4 MG PO TABS: 4.0000 mg | ORAL_TABLET | Freq: Four times a day (QID) | ORAL | 0 refills | Status: DC

## 2023-12-22 MED ORDER — NALTREXONE HCL 50 MG PO TABS: 50.0000 mg | ORAL_TABLET | Freq: Every day | ORAL | 0 refills | Status: DC

## 2023-12-22 MED ORDER — BUSPIRONE HCL 15 MG PO TABS: 15.0000 mg | ORAL_TABLET | Freq: Every morning | ORAL | 0 refills | Status: DC

## 2023-12-22 NOTE — Assessment & Plan Note (Signed)
 Managed with sertraline , depakote  trazodone  as needed, and Buspar  - Refill Buspar  prescription.

## 2023-12-22 NOTE — Progress Notes (Signed)
    SUBJECTIVE:   CHIEF COMPLAINT / HPI: ED fu  Discussed the use of AI scribe software for clinical note transcription with the patient, who gave verbal consent to proceed.  Seen in ED 12/14/2023 for alcohol withdrawal.  Was given Ativan  in the ED and started on Librium  taper for outpatient follow-up.  History of Present Illness Timothy [name] "[name]" is a 58 year old male with alcohol use disorder who presents for follow-up after starting medication for alcohol withdrawal symptoms.  Since starting medication, he experiences reduced agitation, improved cognitive function, and decreased anxiety. Alcohol consumption has decreased from twelve beers a day to five or six beers a day. Cigarette use has reduced from two packs a day to one pack a day.  He is currently taking librium  for alcohol withdrawal symptoms, which he finds significantly beneficial. He is also on sertraline  and occasionally takes trazodone  for sleep, using half a pill due to side effects. He continues to take Depakote  and requires refills for Buspar  and Zofran .  A recent nerve study indicated concern for carpal tunnel syndrome, and he is awaiting an ultrasound for further evaluation.  No palpitations or tremors since starting the medication. He consumed a beer this morning before the visit.   PERTINENT  PMH / PSH: HTN. Alcohol use, COPD  OBJECTIVE:   BP 114/85   Pulse 88   Ht 5\' 10"  (1.778 m)   Wt 153 lb (69.4 kg)   SpO2 100%   BMI 21.95 kg/m   General: Well appearing, NAD, awake, alert, responsive to questions Head: Normocephalic atraumatic CV: Regular rate and rhythm no murmurs rubs or gallops Respiratory: Clear to ausculation bilaterally, mild expiratory wheezes, chest rises symmetrically,  no increased work of breathing Abdomen: Soft, non-tender, non-distended, normoactive bowel sounds  Extremities: Mild tremor at rest in upper extremity  ASSESSMENT/PLAN:   Assessment & Plan Alcohol use disorder Currently  on Librium  taper, reducing alcohol consumption. Reports calmness and mild tremors. Transition to naltrexone planned to manage cravings and anxiety. - Transition from Librium  to naltrexone 50 mg daily - Schedule follow-up in one week to assess symptoms and response to naltrexone, consider increase dose to 100 mg Panic anxiety syndrome Managed with sertraline , depakote  trazodone  as needed, and Buspar  - Refill Buspar  prescription. Numbness and tingling in left hand Suspected based on nerve study. Further confirmation needed with ultrasound. - Await ultrasound    Genora Kidd, MD [facility]

## 2023-12-22 NOTE — Patient Instructions (Signed)
 It was great to see you! Thank you for allowing me to participate in your care!   Our plans for today:  - Please finish your Librium  taper, I am sending in naltrexone for you to start 50 mg daily, please follow-up in 1 week to see if we need to increase this dosage and to see how your symptoms are doing - I have refilled your BuSpar  and ondansetron  - You will need an ultrasound of your nerves which they are going to contact you about  Take care and seek immediate care sooner if you develop any concerns.  Genora Kidd, MD

## 2023-12-22 NOTE — Assessment & Plan Note (Signed)
 Currently on Librium  taper, reducing alcohol consumption. Reports calmness and mild tremors. Transition to naltrexone planned to manage cravings and anxiety. - Transition from Librium  to naltrexone 50 mg daily - Schedule follow-up in one week to assess symptoms and response to naltrexone, consider increase dose to 100 mg

## 2023-12-24 ENCOUNTER — Other Ambulatory Visit: Payer: Self-pay

## 2023-12-24 DIAGNOSIS — R202 Paresthesia of skin: Secondary | ICD-10-CM

## 2023-12-30 ENCOUNTER — Ambulatory Visit (INDEPENDENT_AMBULATORY_CARE_PROVIDER_SITE_OTHER): Admitting: Neurology

## 2023-12-30 ENCOUNTER — Ambulatory Visit: Payer: Self-pay | Admitting: Student

## 2023-12-30 DIAGNOSIS — R202 Paresthesia of skin: Secondary | ICD-10-CM | POA: Diagnosis not present

## 2023-12-30 NOTE — Procedures (Signed)
  [facility]  [address] [place], [address]  [place], [place] [address] Tel: [phone] Fax: [phone] Test Date:  12/30/2023  Patient: Timothy [name] DOB: 28-Jun-1966 Physician: Rommie Coats, MD  Sex: Male Height: 5\' 10"  Ref Phys: Ernestina Headland, MD  ID#: [id number]   Technician:    History: This is a 58 year old male with left hand numbness.  Findings: High frequency (4.0-16.0 MHz) B-mode, nonvascular ultrasound of the bilateral upper limbs shows: Cross sectional areas (CSA) of left median (palm to mid upper arm) and left ulnar (wrist to mid upper arm) nerves are within normal limits. Wrist to forearm ratio of left median nerves is within normal limits. There is subluxation and dislocation of the left ulnar nerve from the ulnar groove with maximal elbow flexion.  Impression: This is a normal neuromuscular ultrasound. Specifically, there is no ultrasonographic evidence of entrapment of the left median nerve at the wrist segment, or of entrapment/repetitive nerve trauma of the left ulnar nerve at the elbow segment. There is subluxation and dislocation of the left ulnar nerve at the ulnar groove with maximal elbow flexion which puts it at increased risk of injury. No other obvious lesion involving the adjacent bone or tendon is identified. No definite vascular abnormalities.    _______________  Rommie Coats, MD [facility]   Nerve Measurements   Site Area Segment Area Ratio Mobility Vascularity Comment   mm Norm   Norm     Left Median  Palm 7.3         Wrist 8.1  < 13.0         Forearm 6.1  < 10.7  Wrist - Forearm 1.33  < 1.50      Pronator teres 7.0  < 11.0         Mid-arm 7.1  < 13.1         Left Ulnar  Wrist 4.3  < 10.0         Forearm 6.8  < 10.0         Distal elbow 6.9  < 10.0         Elbow 8.4  < 10.0       Dislocates w/ max flexion  Proximal elbow 7.2  < 10.0         Mid-arm 4.7  < 10.0          Ultrasound Images:

## 2023-12-31 ENCOUNTER — Ambulatory Visit: Admitting: Student

## 2023-12-31 NOTE — Progress Notes (Deleted)
    SUBJECTIVE:   CHIEF COMPLAINT / HPI:   Discussed the use of AI scribe software for clinical note transcription with the patient, who gave verbal consent to proceed.  History of Present Illness   At last visit was transitioned from Librium  to naltrexone  and told to follow-up in 1 week to assess symptoms.  Today***.  PERTINENT  PMH / PSH: HTN, COPD, alcohol use  OBJECTIVE:   There were no vitals taken for this visit.  ***  ASSESSMENT/PLAN:   Assessment & Plan    Assessment and Plan Assessment & Plan    Genora Kidd, MD [facility]

## 2024-02-10 ENCOUNTER — Telehealth: Payer: Self-pay

## 2024-02-10 DIAGNOSIS — F1090 Alcohol use, unspecified, uncomplicated: Secondary | ICD-10-CM

## 2024-02-10 MED ORDER — NALTREXONE HCL 50 MG PO TABS: 50.0000 mg | ORAL_TABLET | Freq: Every day | ORAL | 0 refills | Status: DC

## 2024-02-10 NOTE — Telephone Encounter (Signed)
 Naltrexone  refilled. Agree with in-person evaluation in 1 week. Consider increasing dosage for aid in alcohol cessation at that time.

## 2024-02-10 NOTE — Telephone Encounter (Signed)
 Called patient.   He reports he will start the medication tomorrow.   He was appreciative.

## 2024-02-10 NOTE — Telephone Encounter (Signed)
 Patient Timothy [name] on nurse line requesting to speak with Dr. Tharon. He reports he has personal issues he would like to discuss.  I called patient back to gather more information.  He reports he is an alcoholic and he is requesting help. He reports his drinking is ruining his life. He reports multiple issues of aggression towards others while drinking. He reports multiple court dates coming up.   He reports he is requesting help from PCP. He reports he prefers an outpatient clinic vs inpatient for treatment.   He reports he has been prescribed Naltrexone  in the past and reports good success. He is requesting a refill of this prior to his apt next week.  Patient has an apt with PCP already scheduled for 7/28. He reports he wanted to make PCP aware of his thoughts prior to his apt.   Will forward to PCP.

## 2024-02-16 ENCOUNTER — Encounter: Payer: Self-pay | Admitting: Family Medicine

## 2024-02-16 ENCOUNTER — Other Ambulatory Visit: Payer: Self-pay

## 2024-02-16 ENCOUNTER — Ambulatory Visit (INDEPENDENT_AMBULATORY_CARE_PROVIDER_SITE_OTHER): Admitting: Family Medicine

## 2024-02-16 VITALS — BP 130/86 | HR 77 | Ht 70.0 in | Wt 144.8 lb

## 2024-02-16 DIAGNOSIS — F1090 Alcohol use, unspecified, uncomplicated: Secondary | ICD-10-CM | POA: Diagnosis present

## 2024-02-16 DIAGNOSIS — L02211 Cutaneous abscess of abdominal wall: Secondary | ICD-10-CM

## 2024-02-16 MED ORDER — NALTREXONE HCL 50 MG PO TABS: 100.0000 mg | ORAL_TABLET | Freq: Every day | ORAL | 0 refills | Status: DC

## 2024-02-16 MED ORDER — CYCLOBENZAPRINE HCL 10 MG PO TABS
10.0000 mg | ORAL_TABLET | Freq: Three times a day (TID) | ORAL | 1 refills | Status: DC | PRN
Start: 2024-02-16 — End: 2024-03-19

## 2024-02-16 NOTE — Assessment & Plan Note (Signed)
 6 days sober. Will increase to 100 mg naltrexone  daily given good response. Vitals and exam reassuring. He has a good support system. Referred to psychiatry for further resources. Declines AA. He will follow up with me in 1-2 weeks or sooner if needed. Congratulated on excellent work.

## 2024-02-16 NOTE — Progress Notes (Signed)
    SUBJECTIVE:   CHIEF COMPLAINT / HPI:   Alcohol use cessation Recently refilled naltrexone  50 mg daily after switching from librium . Patient is doing well with this dosing. He is 6 days sober. He has quit before, 2 years ago. He comes from a family of alcohol users. He is motivated by making sure he treats his family better and stays out of the police. He is tired of feeling bad with this. He is eating more now. He would like to reduce the chances of inpatient services. Has tried AA but is more comfortable with a one-on-one setting. He used to have a psychiatrist here that helped with this, but she is no longer practicing. He also wants to ensure he has a better relationship in the future with his son and future grandchild.  PERTINENT  PMH / PSH: smokes tobacco, occasionally weed, no other substances  OBJECTIVE:   BP 130/86   Pulse 77   Ht 5' 10 (1.778 m)   Wt 144 lb 12.8 oz (65.7 kg)   SpO2 97%   BMI 20.78 kg/m   General: Alert and oriented, in NAD Skin: Warm, dry, and intact HEENT: NCAT, EOM grossly normal, midline nasal septum Cardiac: RRR, no m/r/g appreciated Respiratory: CTAB, breathing and speaking comfortably on RA Abdominal: Soft, nontender, nondistended, normoactive bowel sounds, ostomy bag with good output Extremities: Moves all extremities grossly equally Neurological: No gross focal deficit Psychiatric: Appropriate mood and affect   ASSESSMENT/PLAN:   Assessment & Plan Alcohol use disorder 6 days sober. Will increase to 100 mg naltrexone  daily given good response. Vitals and exam reassuring. He has a good support system. Referred to psychiatry for further resources. Declines AA. He will follow up with me in 1-2 weeks or sooner if needed. Congratulated on excellent work.   Stuart Redo, MD [facility]

## 2024-02-16 NOTE — Patient Instructions (Signed)
 I have increased your naltrexone  dose to 100 mg daily. Let me know if you have nausea, vomiting, or diarrhea with this dose. I have sent ina  referral for psychiatry, as well. Come back in 1-2 weeks to see me or sooner if needed.

## 2024-02-17 ENCOUNTER — Encounter: Payer: Self-pay | Admitting: Family Medicine

## 2024-02-27 ENCOUNTER — Ambulatory Visit (INDEPENDENT_AMBULATORY_CARE_PROVIDER_SITE_OTHER): Admitting: Family Medicine

## 2024-02-27 VITALS — BP 138/80 | HR 83 | Wt 148.4 lb

## 2024-02-27 DIAGNOSIS — J449 Chronic obstructive pulmonary disease, unspecified: Secondary | ICD-10-CM | POA: Diagnosis not present

## 2024-02-27 DIAGNOSIS — F339 Major depressive disorder, recurrent, unspecified: Secondary | ICD-10-CM | POA: Diagnosis not present

## 2024-02-27 DIAGNOSIS — F1090 Alcohol use, unspecified, uncomplicated: Secondary | ICD-10-CM | POA: Diagnosis present

## 2024-02-27 MED ORDER — SERTRALINE HCL 100 MG PO TABS: 150.0000 mg | ORAL_TABLET | Freq: Every day | ORAL | Status: DC

## 2024-02-27 MED ORDER — NALTREXONE HCL 50 MG PO TABS: 100.0000 mg | ORAL_TABLET | Freq: Every day | ORAL | 0 refills | Status: DC

## 2024-02-27 NOTE — Progress Notes (Signed)
    SUBJECTIVE:   CHIEF COMPLAINT / HPI:   Alcohol cessation Made it 8 days and got bored and frustrated. He has had about 12 drinks since out last visit. Not every day, though. Most in one day is 3 drinks. He does not keep the alcohol at the house, but he does find himself going to the store. He only gets one beer at the store at a time. He has not heard anything from the psychiatrists. He has been taking naltrexone  at 100 mg daily, but he feels like he could have more help. He has been painting his apartment and it trying to stay busy. He also knows mowing and working in the shop, but these take more money. He has been talking to bhis cousin who has been sober for 17 years in [place]. He is close to wanting to go to thsi. His brother is a good area of support, as well. Relationshop wit hwife ios bgetter.  PERTINENT  PMH / PSH: Hypertension, COPD, peptic ulcer disease, substance use, colostomy in place  OBJECTIVE:   BP 138/80   Pulse 83   Wt 148 lb 6.4 oz (67.3 kg)   SpO2 92%   BMI 21.29 kg/m   General: Alert and oriented, in NAD Skin: Warm, dry, and intact HEENT: NCAT, EOM grossly normal, midline nasal septum Cardiac: RRR, no m/r/g appreciated Respiratory: Mild expiratory wheezes throughout, no crackles, breathing and speaking comfortably on RA Abdominal: Nondistended, normoactive bowel sounds Extremities: Moves all extremities grossly equally Neurological: No gross focal deficit Psychiatric: Appropriate mood and affect, appears mildly inebriated  ASSESSMENT/PLAN:   Assessment & Plan Alcohol use disorder Depression, recurrent (HCC) Has experienced relapse of alcohol use.  Discussed and normalized the normal time course of relapse during recovery.  He remains motivated to quit.  We discussed ways to counteract his triggers, particularly boredom/loneliness, and he will speak more with his cousin who is 17 years sober and potentially go to [employer] with him.  Continue naltrexone  100 mg  daily; will recheck LFTs today.  He will also increase his sertraline  to 150 mg daily to help with a compounding depression component.  He will continue to follow-up with me in 1 to 2 weeks. Chronic obstructive pulmonary disease, unspecified COPD type (HCC) Mild wheezes today on exam.  He is not taking his Anoro elliptica.  He will take this as soon as he gets home.  Reassuring SpO2 and breathing comfortably on room air.   Stuart Redo, MD [facility]

## 2024-02-27 NOTE — Patient Instructions (Signed)
 Continue naltrexone . I have increased sertraline  to 150 mg daily. Just take one and a half of these until you run out, then let me know, and I will send in the 150 mg tablets. Continue to chat about AA with your cousin. We obtained liver labs today. I will see you back in 1-2 weeks.

## 2024-02-28 ENCOUNTER — Ambulatory Visit: Payer: Self-pay | Admitting: Family Medicine

## 2024-02-28 LAB — COMPREHENSIVE METABOLIC PANEL WITH GFR
ALT: 16 IU/L (ref 0–44)
AST: 20 IU/L (ref 0–40)
Albumin: 4.4 g/dL (ref 3.8–4.9)
Alkaline Phosphatase: 87 IU/L (ref 44–121)
BUN/Creatinine Ratio: 16 (ref 9–20)
BUN: 13 mg/dL (ref 6–24)
Bilirubin Total: <0.2 mg/dL (ref 0.0–1.2)
CO2: 23 mmol/L (ref 20–29)
Calcium: 10.1 mg/dL (ref 8.7–10.2)
Chloride: 104 mmol/L (ref 96–106)
Creatinine, Ser: 0.8 mg/dL (ref 0.76–1.27)
Globulin, Total: 2.5 g/dL (ref 1.5–4.5)
Glucose: 73 mg/dL (ref 70–99)
Potassium: 4.6 mmol/L (ref 3.5–5.2)
Sodium: 143 mmol/L (ref 134–144)
Total Protein: 6.9 g/dL (ref 6.0–8.5)
eGFR: 105 mL/min/1.73 (ref >59)

## 2024-02-28 NOTE — Assessment & Plan Note (Addendum)
 Has experienced relapse of alcohol use.  Discussed and normalized the normal time course of relapse during recovery.  He remains motivated to quit.  We discussed ways to counteract his triggers, particularly boredom/loneliness, and he will speak more with his cousin who is 17 years sober and potentially go to [employer] with him.  Continue naltrexone  100 mg daily; will recheck LFTs today.  He will also increase his sertraline  to 150 mg daily to help with a compounding depression component.  He will continue to follow-up with me in 1 to 2 weeks.

## 2024-02-28 NOTE — Assessment & Plan Note (Signed)
 Mild wheezes today on exam.  He is not taking his Anoro elliptica.  He will take this as soon as he gets home.  Reassuring SpO2 and breathing comfortably on room air.

## 2024-03-08 ENCOUNTER — Ambulatory Visit: Admitting: Family Medicine

## 2024-03-18 ENCOUNTER — Telehealth (HOSPITAL_COMMUNITY): Payer: Self-pay | Admitting: Licensed Clinical Social Worker

## 2024-03-18 NOTE — Telephone Encounter (Signed)
 The therapist calls Timothy [name] in response to a referral received by [facility]. Timothy [name] says that he is unable to talk today but says that this therapist can call him tomorrow after noon.  Zell Maier, MA, LCSW, [facility], LCAS 03/18/2024

## 2024-03-19 ENCOUNTER — Telehealth (HOSPITAL_COMMUNITY): Payer: Self-pay | Admitting: Licensed Clinical Social Worker

## 2024-03-19 ENCOUNTER — Encounter: Payer: Self-pay | Admitting: Family Medicine

## 2024-03-19 ENCOUNTER — Ambulatory Visit (INDEPENDENT_AMBULATORY_CARE_PROVIDER_SITE_OTHER): Admitting: Family Medicine

## 2024-03-19 VITALS — BP 131/100 | HR 92 | Ht 70.0 in | Wt 144.2 lb

## 2024-03-19 DIAGNOSIS — Z933 Colostomy status: Secondary | ICD-10-CM | POA: Diagnosis not present

## 2024-03-19 DIAGNOSIS — Z720 Tobacco use: Secondary | ICD-10-CM | POA: Diagnosis not present

## 2024-03-19 DIAGNOSIS — J449 Chronic obstructive pulmonary disease, unspecified: Secondary | ICD-10-CM | POA: Diagnosis not present

## 2024-03-19 DIAGNOSIS — F339 Major depressive disorder, recurrent, unspecified: Secondary | ICD-10-CM | POA: Diagnosis not present

## 2024-03-19 DIAGNOSIS — F1090 Alcohol use, unspecified, uncomplicated: Secondary | ICD-10-CM

## 2024-03-19 DIAGNOSIS — L02211 Cutaneous abscess of abdominal wall: Secondary | ICD-10-CM

## 2024-03-19 DIAGNOSIS — F410 Panic disorder [episodic paroxysmal anxiety] without agoraphobia: Secondary | ICD-10-CM

## 2024-03-19 DIAGNOSIS — I10 Essential (primary) hypertension: Secondary | ICD-10-CM | POA: Diagnosis not present

## 2024-03-19 MED ORDER — UMECLIDINIUM-VILANTEROL 62.5-25 MCG/ACT IN AEPB: 1.0000 | INHALATION_SPRAY | Freq: Every day | RESPIRATORY_TRACT | 6 refills | Status: DC

## 2024-03-19 MED ORDER — ALBUTEROL SULFATE HFA 108 (90 BASE) MCG/ACT IN AERS: 2.0000 | INHALATION_SPRAY | Freq: Four times a day (QID) | RESPIRATORY_TRACT | 3 refills | Status: DC | PRN

## 2024-03-19 MED ORDER — FLUTICASONE PROPIONATE HFA 110 MCG/ACT IN AERO: 2.0000 | INHALATION_SPRAY | Freq: Two times a day (BID) | RESPIRATORY_TRACT | 12 refills | Status: DC

## 2024-03-19 MED ORDER — PANTOPRAZOLE SODIUM 40 MG PO TBEC: 40.0000 mg | DELAYED_RELEASE_TABLET | Freq: Every day | ORAL | 1 refills | Status: DC

## 2024-03-19 MED ORDER — CYCLOBENZAPRINE HCL 10 MG PO TABS: 10.0000 mg | ORAL_TABLET | Freq: Three times a day (TID) | ORAL | 1 refills | Status: AC | PRN

## 2024-03-19 MED ORDER — DIVALPROEX SODIUM ER 500 MG PO TB24: 500.0000 mg | ORAL_TABLET | Freq: Every day | ORAL | 1 refills | Status: DC

## 2024-03-19 MED ORDER — GABAPENTIN 100 MG PO CAPS: 200.0000 mg | ORAL_CAPSULE | Freq: Every day | ORAL | 1 refills | Status: DC

## 2024-03-19 MED ORDER — NALTREXONE HCL 50 MG PO TABS: 100.0000 mg | ORAL_TABLET | Freq: Every day | ORAL | 0 refills | Status: DC

## 2024-03-19 MED ORDER — SERTRALINE HCL 100 MG PO TABS: 150.0000 mg | ORAL_TABLET | Freq: Every day | ORAL | 3 refills | Status: DC

## 2024-03-19 MED ORDER — BUSPIRONE HCL 15 MG PO TABS
15.0000 mg | ORAL_TABLET | Freq: Every morning | ORAL | 0 refills | Status: DC
Start: 2024-03-19 — End: 2024-03-26

## 2024-03-19 NOTE — Assessment & Plan Note (Signed)
 Decreased breath sounds and wheezing in the setting of being without his Anoro Ellipta .  I have refilled this.  Reassuringly with good SpO2 and without fever.  He has not been taking Flovent ; I will refill this and strongly encouraged him to add this to his medication regimen.  He will let me know if he is not improving or using albuterol  more with this regimen.

## 2024-03-19 NOTE — Progress Notes (Signed)
    SUBJECTIVE:   CHIEF COMPLAINT / HPI:   Alcohol, tobacco use Has been doing well on naltrexone  and Zoloft .  Down to 1 ppd. Without alcohol for 3-4 days but then tends to grab a drink after that.SABRA Has been contacted by addiction psychiatry and will follow-up with them.  Unfortunately, he has been out of his naltrexone  and Zoloft  the last couple days and he needs a refill.  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  Plans for colostomy reversal within the next year.  Hypertension He states he has been taking his losartan  as prescribed.  OBJECTIVE:   BP (!) 131/100   Pulse 92   Ht 5' 10 (1.778 m)   Wt 144 lb 4 oz (65.4 kg)   SpO2 99%   BMI 20.70 kg/m   General: Alert and oriented, in NAD Skin: Warm, dry, and intact HEENT: NCAT, EOM grossly normal, midline nasal septum Cardiac: RRR, no m/r/g appreciated Respiratory: Decreased breath sounds throughout with expiratory wheezes in the bilateral lower lobes, breathing and speaking comfortably on RA Extremities: Moves all extremities grossly equally Neurological: No gross focal deficit Psychiatric: Appropriate mood and affect   ASSESSMENT/PLAN:   Assessment & Plan Alcohol use disorder Continue naltrexone  and Zoloft .  We will increase his gabapentin  to 200 mg daily.  He will follow-up with psychiatry for further evaluation.  Congratulated him on steady decline in use of alcohol. He will follow-up again with me in 2 weeks. Depression, recurrent (HCC) Panic anxiety syndrome We refilled his BuSpar  and Depakote  today. Chronic obstructive pulmonary disease, unspecified COPD type (HCC) Decreased breath sounds and wheezing in the setting of being without his Anoro Ellipta .  I have refilled this.  Reassuringly with good SpO2 and without fever.  He has not been taking Flovent ; I will refill this and strongly encouraged him to add this to his medication  regimen.  He will let me know if he is not improving or using albuterol  more with this regimen. Colostomy in place [facility]) Continue to follow with surgery for colostomy reversal. Essential hypertension, benign Uncontrolled today, likely in the setting of being out of other medications that help to control his substance use and COPD.  Exam reassuring today.  Previous two office visits he has been closer to goal.  Follow-up at next visit. Tobacco use Congratulated patient on decreasing use to 1 pack/day.  Continue naltrexone  and Zoloft  as above.  He has been told by his surgeon that continued cessation would help even more with wound healing for when he undergoes colostomy reversal.   Stuart Redo, MD [facility]

## 2024-03-19 NOTE — Assessment & Plan Note (Signed)
 Congratulated patient on decreasing use to 1 pack/day.  Continue naltrexone  and Zoloft  as above.  He has been told by his surgeon that continued cessation would help even more with wound healing for when he undergoes colostomy reversal.

## 2024-03-19 NOTE — Assessment & Plan Note (Signed)
 We refilled his BuSpar  and Depakote  today.

## 2024-03-19 NOTE — Assessment & Plan Note (Addendum)
 Continue naltrexone  and Zoloft .  We will increase his gabapentin  to 200 mg daily.  He will follow-up with psychiatry for further evaluation.  Congratulated him on steady decline in use of alcohol. He will follow-up again with me in 2 weeks.

## 2024-03-19 NOTE — Assessment & Plan Note (Addendum)
 Continue to follow with surgery for colostomy reversal.

## 2024-03-19 NOTE — Patient Instructions (Signed)
 Continue to take your medications as prescribed. Be sure to follow up with the psychiatrists.  Also I will continue to follow on your journey to reverse the colostomy.  Let me know if the inhalers (anoro ellipta  AND flovent  every day) is not helping the breathing enough.

## 2024-03-19 NOTE — Telephone Encounter (Signed)
 The therapist calls Timothy [name] confirming his identity via two identifiers. He says that he talked to Dr. Tharon and told him that he is not a group person. Timothy [name] says that he drinks alcohol and that when he drinks he will get himself in trouble.   The therapist schedules for Timothy [name] for an assessment on 03/23/24 at 9 a.m.    Zell Maier, MA, LCSW, [facility], LCAS 03/19/2024

## 2024-03-19 NOTE — Assessment & Plan Note (Signed)
 Uncontrolled today, likely in the setting of being out of other medications that help to control his substance use and COPD.  Exam reassuring today.  Previous two office visits he has been closer to goal.  Follow-up at next visit.

## 2024-03-23 ENCOUNTER — Ambulatory Visit (HOSPITAL_COMMUNITY): Admitting: Licensed Clinical Social Worker

## 2024-03-23 ENCOUNTER — Telehealth (HOSPITAL_COMMUNITY): Payer: Self-pay | Admitting: Licensed Clinical Social Worker

## 2024-03-23 NOTE — Telephone Encounter (Signed)
 Timothy [name] leaves a voicemail saying that he will not be able to make his 9 AM appointment this morning as something came up and would like to see if he could get scheduled on Thursday.  The therapist requests that the scheduling staff reach out to Timothy [name] and assist him with getting a new appointment.  Zell Maier, MA, LCSW, [name], LCAS 03/23/2024

## 2024-03-25 ENCOUNTER — Telehealth: Payer: Self-pay | Admitting: Family Medicine

## 2024-03-25 DIAGNOSIS — F410 Panic disorder [episodic paroxysmal anxiety] without agoraphobia: Secondary | ICD-10-CM

## 2024-03-25 NOTE — Telephone Encounter (Signed)
 Patient calls requesting Dr Tharon give him a call today or tomorrow. He says he is going into rehab for his alcoholism on Monday, September 8th and would like to speak to [facility] before this. Please advise.

## 2024-03-26 MED ORDER — BUSPIRONE HCL 15 MG PO TABS: 15.0000 mg | ORAL_TABLET | Freq: Every morning | ORAL | 0 refills | Status: DC

## 2024-03-26 NOTE — Telephone Encounter (Signed)
 Spoke with patient about next steps in his journey with alcoholism.  He has connected with his cousin who is a member of AA and has been sober for 13 years.  He will contact an AA group and see if he can get in with them.  He wants to try this before being admitted to a residential facility.  We discussed this is a great plan.  We also discussed obtaining his medications to help with his journey; he has been out of them for a week, but he believes he will have the money to get them today.  We may need to [facility] to help with cost.  I will see him back early next week for follow-up.

## 2024-03-29 ENCOUNTER — Ambulatory Visit: Admitting: Family Medicine

## 2024-04-05 ENCOUNTER — Emergency Department (HOSPITAL_COMMUNITY)
Admission: EM | Admit: 2024-04-05 | Discharge: 2024-04-05 | Discharge status: Against Medical Advice | Attending: Emergency Medicine | Admitting: Emergency Medicine

## 2024-04-05 ENCOUNTER — Encounter (HOSPITAL_COMMUNITY): Payer: Self-pay | Admitting: Emergency Medicine

## 2024-04-05 ENCOUNTER — Emergency Department (HOSPITAL_COMMUNITY)

## 2024-04-05 ENCOUNTER — Other Ambulatory Visit: Payer: Self-pay

## 2024-04-05 DIAGNOSIS — F17210 Nicotine dependence, cigarettes, uncomplicated: Secondary | ICD-10-CM | POA: Diagnosis not present

## 2024-04-05 DIAGNOSIS — F10920 Alcohol use, unspecified with intoxication, uncomplicated: Secondary | ICD-10-CM | POA: Insufficient documentation

## 2024-04-05 DIAGNOSIS — J449 Chronic obstructive pulmonary disease, unspecified: Secondary | ICD-10-CM | POA: Diagnosis not present

## 2024-04-05 DIAGNOSIS — Z5329 Procedure and treatment not carried out because of patient's decision for other reasons: Secondary | ICD-10-CM | POA: Diagnosis not present

## 2024-04-05 DIAGNOSIS — Z5321 Procedure and treatment not carried out due to patient leaving prior to being seen by health care provider: Secondary | ICD-10-CM

## 2024-04-05 DIAGNOSIS — Y908 Blood alcohol level of 240 mg/100 ml or more: Secondary | ICD-10-CM | POA: Diagnosis not present

## 2024-04-05 DIAGNOSIS — Z720 Tobacco use: Secondary | ICD-10-CM

## 2024-04-05 DIAGNOSIS — R0602 Shortness of breath: Secondary | ICD-10-CM | POA: Diagnosis present

## 2024-04-05 LAB — I-STAT VENOUS BLOOD GAS, ED
Acid-Base Excess: 0 mmol/L (ref 0.0–2.0)
Bicarbonate: 27.8 mmol/L (ref 20.0–28.0)
Calcium, Ion: 1.19 mmol/L (ref 1.15–1.40)
HCT: 41 % (ref 39.0–52.0)
Hemoglobin: 13.9 g/dL (ref 13.0–17.0)
O2 Saturation: 44 %
Potassium: 3.8 mmol/L (ref 3.5–5.1)
Sodium: 140 mmol/L (ref 135–145)
TCO2: 29 mmol/L (ref 22–32)
pCO2, Ven: 56.9 mmHg (ref 44–60)
pH, Ven: 7.297 (ref 7.25–7.43)
pO2, Ven: 28 mmHg — CL (ref 32–45)

## 2024-04-05 LAB — I-STAT CHEM 8, ED
BUN: 6 mg/dL (ref 6–20)
Calcium, Ion: 1.2 mmol/L (ref 1.15–1.40)
Chloride: 102 mmol/L (ref 98–111)
Creatinine, Ser: 1 mg/dL (ref 0.61–1.24)
Glucose, Bld: 88 mg/dL (ref 70–99)
HCT: 42 % (ref 39.0–52.0)
Hemoglobin: 14.3 g/dL (ref 13.0–17.0)
Potassium: 3.7 mmol/L (ref 3.5–5.1)
Sodium: 141 mmol/L (ref 135–145)
TCO2: 28 mmol/L (ref 22–32)

## 2024-04-05 LAB — BASIC METABOLIC PANEL WITH GFR
Anion gap: 10 (ref 5–15)
BUN: 6 mg/dL (ref 6–20)
CO2: 26 mmol/L (ref 22–32)
Calcium: 9 mg/dL (ref 8.9–10.3)
Chloride: 101 mmol/L (ref 98–111)
Creatinine, Ser: 0.72 mg/dL (ref 0.61–1.24)
GFR, Estimated: >60 mL/min (ref >60)
Glucose, Bld: 92 mg/dL (ref 70–99)
Potassium: 3.6 mmol/L (ref 3.5–5.1)
Sodium: 137 mmol/L (ref 135–145)

## 2024-04-05 LAB — CBC
HCT: 40 % (ref 39.0–52.0)
Hemoglobin: 12.9 g/dL — ABNORMAL LOW (ref 13.0–17.0)
MCH: 33.2 pg (ref 26.0–34.0)
MCHC: 32.3 g/dL (ref 30.0–36.0)
MCV: 102.8 fL — ABNORMAL HIGH (ref 80.0–100.0)
Platelets: 282 K/uL (ref 150–400)
RBC: 3.89 MIL/uL — ABNORMAL LOW (ref 4.22–5.81)
RDW: 12.7 % (ref 11.5–15.5)
WBC: 8.1 K/uL (ref 4.0–10.5)
nRBC: 0 % (ref 0.0–0.2)

## 2024-04-05 LAB — ETHANOL: Alcohol, Ethyl (B): 259 mg/dL — ABNORMAL HIGH (ref <15)

## 2024-04-05 LAB — TROPONIN I (HIGH SENSITIVITY): Troponin I (High Sensitivity): 7 ng/L (ref <18)

## 2024-04-05 MED ORDER — ALBUTEROL SULFATE HFA 108 (90 BASE) MCG/ACT IN AERS: 2.0000 | INHALATION_SPRAY | Freq: Once | RESPIRATORY_TRACT | Status: DC

## 2024-04-05 MED ORDER — IPRATROPIUM-ALBUTEROL 0.5-2.5 (3) MG/3ML IN SOLN
3.0000 mL | Freq: Once | RESPIRATORY_TRACT | Status: AC
  Administered 2024-04-05: 3 mL via RESPIRATORY_TRACT
  Filled 2024-04-05: qty 3

## 2024-04-05 NOTE — ED Notes (Signed)
 Pt stated his friend was here to pick him up and that he would pull IV if we didn't. PT declined any other care, IV was pulled and pt ambulated independently and steadily out the door.

## 2024-04-05 NOTE — ED Provider Notes (Signed)
 [place] EMERGENCY DEPARTMENT AT [facility] Provider Note   CSN: [id number] Arrival date & time: 04/05/24  1717    Patient presents with: Shortness of breath   Timothy [name] is a 58 y.o. male history of tobacco use, COPD, EtOH dependence here for evaluation of shortness of breath.  States he is gone through an entire inhaler over the last 3 days.  He still continues to use tobacco.  States he did start drinking again yesterday.  Prior to that he had been sober for 30 days.  He smoked a cigarette just prior to EMS picking him up.  States he occasionally gets chest pain to his left chest typically associated with wheezing.  Nonexertional, nonpleuritic in nature.  He has an ostomy bag which he denies any abdominal pain, bloody stool or change in output.  He denies any fever, back pain, abdominal pain, pain or swelling to lower legs.  He denies any PND orthopnea.  No recent sick contacts.  No hemoptysis.  When EMS was called he was wheezing bilaterally.  He received DuoNeb as well as 125 Solu-Medrol .  Brought here to ED.  Denies any recent falls or injuries.  He states he has a chronic cough which is unchanged.   HPI     Prior to Admission medications   Medication Sig Start Date End Date Taking? Authorizing Provider  albuterol  (VENTOLIN  HFA) 108 (90 Base) MCG/ACT inhaler Inhale 2 puffs into the lungs every 6 (six) hours as needed for wheezing or shortness of breath. 03/19/24   Tharon Lung, MD  busPIRone  (BUSPAR ) 15 MG tablet Take 1 tablet (15 mg total) by mouth every morning. 03/26/24   Tharon Lung, MD  chlordiazePOXIDE  (LIBRIUM ) 25 MG capsule 50mg  PO TID x 1D, then 25-50mg  PO BID X 1D, then 25-50mg  PO QD X 1D 12/14/23   Kingsley, Victoria K, DO  Cyanocobalamin  (B-12) 1000 MCG TABS TAKE 1 TABLET BY MOUTH DAILY 07/14/23   Joshua Domino, DO  cyclobenzaprine  (FLEXERIL ) 10 MG tablet Take 1 tablet (10 mg total) by mouth 3 (three) times daily as needed for muscle spasms. 03/19/24   Tharon Lung,  MD  divalproex  (DEPAKOTE  ER) 500 MG 24 hr tablet Take 1 tablet (500 mg total) by mouth daily. 03/19/24   Tharon Lung, MD  fluticasone  (FLOVENT  HFA) 110 MCG/ACT inhaler Inhale 2 puffs into the lungs 2 (two) times daily. 03/19/24   Tharon Lung, MD  gabapentin  (NEURONTIN ) 100 MG capsule Take 2 capsules (200 mg total) by mouth daily. 03/19/24   Tharon Lung, MD  lidocaine  (LIDODERM ) 5 % Place 1 patch onto the skin daily. Remove & Discard patch within 12 hours or as directed by MD 08/01/23   Tharon Lung, MD  losartan  (COZAAR ) 100 MG tablet TAKE 1 TABLET (100 MG TOTAL) BY MOUTH DAILY. 12/16/23   Dameron, Marisa, DO  Multiple Vitamin (MULTIVITAMIN WITH MINERALS) TABS tablet Take 1 tablet by mouth daily. 05/22/23   Christia Budds, MD  naltrexone  (DEPADE) 50 MG tablet Take 2 tablets (100 mg total) by mouth daily. 03/19/24   Tharon Lung, MD  ondansetron  (ZOFRAN ) 4 MG tablet Take 1 tablet (4 mg total) by mouth every 6 (six) hours. 12/22/23   Christia Budds, MD  pantoprazole  (PROTONIX ) 40 MG tablet Take 1 tablet (40 mg total) by mouth daily. 03/19/24   Tharon Lung, MD  Polyethylene Glycol 3350  (PEG 3350 ) 17 g PACK Take 17 g by mouth daily as needed. 05/22/23   Christia Budds, MD  sertraline  (ZOLOFT ) 100  MG tablet Take 1.5 tablets (150 mg total) by mouth daily. 03/19/24   Tharon Lung, MD  traZODone  (DESYREL ) 50 MG tablet Take 0.5 tablets (25 mg total) by mouth at bedtime as needed for sleep. 07/31/23   Tharon Lung, MD  umeclidinium-vilanterol (ANORO ELLIPTA ) 62.5-25 MCG/ACT AEPB Inhale 1 puff into the lungs daily. 03/19/24   Tharon Lung, MD  lurasidone  (LATUDA ) 40 MG TABS tablet TAKE 1 TABLET(40 MG) BY MOUTH DAILY WITH BREAKFAST 05/12/19 05/08/20  Delane Lye, MD  mometasone -formoterol  (DULERA ) 100-5 MCG/ACT AERO Inhale 2 puffs into the lungs 2 (two) times daily. 02/22/19 05/19/20  Delane Lye, MD    Allergies: Ambien  [zolpidem  tartrate], Bactroban  [mupirocin  calcium ], Zestril  [lisinopril ], Darvon  [propoxyphene], and Morphine  and codeine    Review of Systems  Constitutional: Negative.   HENT: Negative.    Respiratory:  Positive for cough, chest tightness, shortness of breath and wheezing.   Cardiovascular: Negative.  Negative for chest pain.  Gastrointestinal: Negative.   Genitourinary: Negative.   Musculoskeletal: Negative.   Skin: Negative.   Neurological: Negative.   All other systems reviewed and are negative.   Updated Vital Signs BP 139/87   Pulse 73   Temp (!) 97.4 F (36.3 C) (Oral)   Resp 17   SpO2 100%   Physical Exam Vitals and nursing note reviewed.  Constitutional:      General: He is not in acute distress.    Appearance: He is well-developed. He is not ill-appearing, toxic-appearing or diaphoretic.     Comments: Smells of EtOH  HENT:     Head: Normocephalic and atraumatic.     Nose: Nose normal.     Mouth/Throat:     Mouth: Mucous membranes are moist.  Eyes:     Pupils: Pupils are equal, round, and reactive to light.  Cardiovascular:     Rate and Rhythm: Normal rate and regular rhythm.     Pulses: Normal pulses.     Heart sounds: Normal heart sounds.  Pulmonary:     Effort: Pulmonary effort is normal. No respiratory distress.     Breath sounds: Wheezing present.     Comments: Expiratory wheeze bilaterally however able to carry conversation Abdominal:     General: Bowel sounds are normal. There is no distension.     Palpations: Abdomen is soft.     Tenderness: There is no abdominal tenderness.     Comments: Ostomy bag left upper abdomen light brown stool nontender abdomen  Musculoskeletal:        General: No swelling, tenderness, deformity or signs of injury. Normal range of motion.     Cervical back: Normal range of motion and neck supple.     Right lower leg: No edema.     Left lower leg: No edema.     Comments: No bony tenderness, compartments soft, no lower extremity edema  Skin:    General: Skin is warm and dry.     Capillary Refill:  Capillary refill takes less than 2 seconds.  Neurological:     General: No focal deficit present.     Mental Status: He is alert and oriented to person, place, and time.     Comments: Ambulating in room at bedside on my initial evaluation-got up to wash his hands after using urinal ANO x 3 Cranial nerves II through XII grossly intact     (all labs ordered are listed, but only abnormal results are displayed) Labs Reviewed  CBC - Abnormal; Notable for the following components:  Result Value   RBC 3.89 (*)    Hemoglobin 12.9 (*)    MCV 102.8 (*)    All other components within normal limits  ETHANOL - Abnormal; Notable for the following components:   Alcohol, Ethyl (B) 259 (*)    All other components within normal limits  I-STAT VENOUS BLOOD GAS, ED - Abnormal; Notable for the following components:   pO2, Ven 28 (*)    All other components within normal limits  BASIC METABOLIC PANEL WITH GFR  I-STAT CHEM 8, ED  TROPONIN I (HIGH SENSITIVITY)    EKG: None  Radiology: DG Chest Portable 1 View Result Date: 04/05/2024 EXAM: 1 VIEW XRAY OF THE CHEST 04/05/2024 06:06:00 PM COMPARISON: 12/14/2023 CLINICAL HISTORY: SOB. SOB FINDINGS: LUNGS AND PLEURA: No focal pulmonary opacity. No pulmonary edema. No pleural effusion. No pneumothorax. HEART AND MEDIASTINUM: No acute abnormality of the cardiac and mediastinal silhouettes. BONES AND SOFT TISSUES: No acute osseous abnormality. IMPRESSION: 1. No acute process. Electronically signed by: Donnice Mania MD 04/05/2024 06:35 PM EDT RP Workstation: [id number]     Procedures   Medications Ordered in the ED  albuterol  (VENTOLIN  HFA) 108 (90 Base) MCG/ACT inhaler 2 puff (has no administration in time range)  ipratropium-albuterol  (DUONEB) 0.5-2.5 (3) MG/3ML nebulizer solution 3 mL (3 mLs Nebulization Given 04/05/24 1712)   58 year old complex medical history here for evaluation of shortness of breath.  Chronic daily tobacco user as well as daily  EtOH user, previously sober for 30 days from alcohol up until 2 days ago here for evaluation shortness of breath.  Started 3 days ago.  Associated cough.  Some chest tightness when wheezing however no exertional or pleuritic chest pain.  No pain or swelling to lower legs.  No history of PE, DVT, recent surgery, mobilization or malignancy.  Here he is expiratory wheeze however is able to carry conversation without difficulty.  He has no tachycardia, tachypnea or hypoxia.  Does smell of alcohol however neurovascularly intact, ambulatory here without difficulty.  He is ANO x 3 and is able to answer all my questions.  Will plan on labs, imaging, EKG, nebulizer and reassess  Labs and imaging personally viewed and interpreted:  CBC without leukocytosis I-STAT Chem-8 without significant abnormality VBG pO2 28--due to venous blood draw Chest x-ray without significant abnormality  EKG without ischemic changes  Nursing notified me that patient was requesting to leave, he had removed IV, EKG leads.  I went to assess patient at bedside however he had already eloped from the emergency department prior to my reassessment.  Labs were not resulted at that time.  According to nursing he ambulated out of the emergency department doors without difficulty.  EtOH resulted at 259 after patient eloped however he was able to answer my questions and ambulate without any difficulty.  According to nursing patient's significant other came to the room to pick him up.  Metabolic panel without significant abnormality, Trop 7                                  Medical Decision Making Amount and/or Complexity of Data Reviewed Independent Historian: EMS External Data Reviewed: labs, radiology, ECG and notes. Labs: ordered. Decision-making details documented in ED Course. Radiology: ordered and independent interpretation performed. Decision-making details documented in ED Course. ECG/medicine tests: ordered and independent  interpretation performed. Decision-making details documented in ED Course.  Risk OTC drugs. Prescription drug management. Parenteral  controlled substances. Decision regarding hospitalization. Diagnosis or treatment significantly limited by social determinants of health.        Final diagnoses:  Chronic obstructive pulmonary disease, unspecified COPD type (HCC)  Tobacco use  Alcoholic intoxication without complication [facility])  Eloped from emergency department    ED Discharge Orders     None          Allanna Bresee A, PA-C 04/05/24 JACKEY Patsey Lot, MD 04/05/24 2251

## 2024-04-05 NOTE — ED Triage Notes (Signed)
 PT [facility] for COPD wth SOB x 3 days.  Smokes a pack a day.  Smoke cigarette prior to transport.  Expiratory wheezing on scene.  97%.  6 beers since 1300.  Sober for the last 30 days prior to today. Chronic ETOH per friends.   Rcvd Duoneb, 125 Solumedrol.  18G L. aC  CBG 97, HR 80, 157/110

## 2024-04-06 NOTE — Progress Notes (Unsigned)
    SUBJECTIVE:   CHIEF COMPLAINT / HPI:   ED f/u Presented to the ED 9/15 due to shortness of breath, was treated with albuterol  and DuoNeb and ended up eloping from the ED Blood work including CBC, BMP, initial troponin were overall unremarkable.  He did have elevated ethanol level. His chest x-ray was unremarkable.  Today reports some continued dyspnea and wheezing, Reports unable to afford any of his medications - the only med he has right now is losartan  which he took this morning about 1hr ago Has not had any inhalers Last time he had albuterol  was about  1wk ago.  Has not had Anoro or Flovent  Denies recent fevers, N/V/D, abd pain Does endorse cough productive of white sputum  PERTINENT  PMH / PSH: HTN, COPD on Flovent  twice daily, Anoro Ellipta  daily, albuterol  as needed, mood disorder, colostomy, tobacco use  OBJECTIVE:   BP 127/88 (BP Location: Left Arm, Patient Position: Sitting, Cuff Size: Normal)   Pulse 80   Ht 5' 10 (1.778 m)   Wt 148 lb (67.1 kg)   SpO2 99%   BMI 21.24 kg/m   General: NAD, pleasant, coughing and wheezing Cardiac: RRR, no murmurs auscultated Respiratory: Significant bilateral expiratory wheezing in all lung fields.  Slightly increased work of breathing on room air.  After DuoNeb, wheezing improved significantly Abdomen: soft, non-tender, non-distended, normoactive bowel sounds Extremities: warm and well perfused, no edema or cyanosis Skin: warm and dry, no rashes noted Neuro: alert, no obvious focal deficits, speech normal Psych: Normal affect and mood  ASSESSMENT/PLAN:   Assessment & Plan Chronic obstructive pulmonary disease with acute exacerbation (HCC) Dyspnea, unspecified type Acute COPD exacerbation with wheezing on exam and recent cough productive of white sputum Has been out of his inhalers for several days which is likely the cause of his exacerbation Reassuringly he is stable on room air  Provided DuoNeb x 1 in clinic as well as  80 mg IM Depo-Medrol  and 500 mg azithromycin .  His symptoms improved upon reevaluation and he remained stable with normal SpO2 on room air  Sent Rx for azithromycin  to 50 mg daily x 4 days starting tomorrow  Provided with Trelegy Ellipta  sample as this was available in our clinic today.  Start taking this daily in place of his Anoro Ellipta .  Close follow-up with Dr. Koval on Tuesday at 9 AM, Dr. Koval to schedule Financial difficulty Appreciate pharmacy assistance today Follow-up with Dr. Koval as above Placed [facility] referral for medication assistance and financial insecurity   Payton Coward, MD [facility]

## 2024-04-07 ENCOUNTER — Ambulatory Visit (INDEPENDENT_AMBULATORY_CARE_PROVIDER_SITE_OTHER): Admitting: Family Medicine

## 2024-04-07 VITALS — BP 127/88 | HR 80 | Ht 70.0 in | Wt 148.0 lb

## 2024-04-07 DIAGNOSIS — R0600 Dyspnea, unspecified: Secondary | ICD-10-CM | POA: Diagnosis not present

## 2024-04-07 DIAGNOSIS — J449 Chronic obstructive pulmonary disease, unspecified: Secondary | ICD-10-CM

## 2024-04-07 DIAGNOSIS — J441 Chronic obstructive pulmonary disease with (acute) exacerbation: Secondary | ICD-10-CM

## 2024-04-07 DIAGNOSIS — Z599 Problem related to housing and economic circumstances, unspecified: Secondary | ICD-10-CM | POA: Diagnosis not present

## 2024-04-07 MED ORDER — TRELEGY ELLIPTA 100-62.5-25 MCG/ACT IN AEPB: 1.0000 | INHALATION_SPRAY | Freq: Every day | RESPIRATORY_TRACT | Status: DC

## 2024-04-07 MED ORDER — IPRATROPIUM BROMIDE 0.02 % IN SOLN
0.5000 mg | Freq: Once | RESPIRATORY_TRACT | Status: AC
  Administered 2024-04-07: 0.5 mg via RESPIRATORY_TRACT

## 2024-04-07 MED ORDER — AZITHROMYCIN 250 MG PO TABS: 250.0000 mg | ORAL_TABLET | Freq: Every day | ORAL | 0 refills | Status: DC

## 2024-04-07 MED ORDER — METHYLPREDNISOLONE SODIUM SUCC 125 MG IJ SOLR: 125.0000 mg | Freq: Once | INTRAMUSCULAR | Status: DC

## 2024-04-07 MED ORDER — AZITHROMYCIN 500 MG PO TABS
500.0000 mg | ORAL_TABLET | Freq: Once | ORAL | Status: AC
  Administered 2024-04-07: 500 mg via ORAL

## 2024-04-07 MED ORDER — IPRATROPIUM-ALBUTEROL 0.5-2.5 (3) MG/3ML IN SOLN: 3.0000 mL | Freq: Once | RESPIRATORY_TRACT | Status: DC

## 2024-04-07 MED ORDER — ALBUTEROL SULFATE (2.5 MG/3ML) 0.083% IN NEBU
2.5000 mg | INHALATION_SOLUTION | Freq: Once | RESPIRATORY_TRACT | Status: AC
  Administered 2024-04-07: 2.5 mg via RESPIRATORY_TRACT

## 2024-04-07 MED ORDER — METHYLPREDNISOLONE ACETATE 80 MG/ML IJ SUSP
80.0000 mg | Freq: Once | INTRAMUSCULAR | Status: AC
  Administered 2024-04-07: 80 mg via INTRAMUSCULAR

## 2024-04-07 MED ORDER — TRELEGY ELLIPTA 200-62.5-25 MCG/ACT IN AEPB
1.0000 | INHALATION_SPRAY | Freq: Every day | RESPIRATORY_TRACT | Status: DC
Start: 2024-04-07 — End: 2024-04-13

## 2024-04-07 NOTE — Patient Instructions (Addendum)
 Please take Trelegy inhaler once daily  Starting tomorrow 9/18, please take azithromycin  250mg  once daily  Continue your current medications otherwise  You should hear back soon from our case management team  See Dr. Koval 9am on Tuesday 9/23

## 2024-04-07 NOTE — Progress Notes (Signed)
 Asked by Dr. Romelle to assist in access to care for patient not being able to afford inhalers. Patient educated on purpose, proper use and potential adverse effects of Trelegy (umeclidinium / vilanterol / fluticasone ).   Following instruction patient verbalized understanding of treatment plan.  Medication Samples have been provided to the patient.  Drug name: Trelegy (umeclidinium / vilanterol / fluticasone )     Strength: 62.5 mcg/ 200 mcg/ 25       Qty: 1 LOT: AP6L Exp.Date: 06/20/2025  Dosing instructions: 1 inhalation by mouth daily  The patient has been instructed regarding the correct time, dose, and frequency of taking this medication, including desired effects and most common side effects.   Timothy [name] 9:48 AM 04/07/2024

## 2024-04-07 NOTE — Assessment & Plan Note (Signed)
 Acute COPD exacerbation with wheezing on exam and recent cough productive of white sputum Has been out of his inhalers for several days which is likely the cause of his exacerbation Reassuringly he is stable on room air  Provided DuoNeb x 1 in clinic as well as 80 mg IM Depo-Medrol  and 500 mg azithromycin .  His symptoms improved upon reevaluation and he remained stable with normal SpO2 on room air  Sent Rx for azithromycin  to 50 mg daily x 4 days starting tomorrow  Provided with Trelegy Ellipta  sample as this was available in our clinic today.  Start taking this daily in place of his Anoro Ellipta .  Close follow-up with Dr. Koval on Tuesday at 9 AM, Dr. Koval to schedule

## 2024-04-13 ENCOUNTER — Ambulatory Visit (INDEPENDENT_AMBULATORY_CARE_PROVIDER_SITE_OTHER): Admitting: Pharmacist

## 2024-04-13 ENCOUNTER — Encounter: Payer: Self-pay | Admitting: Pharmacist

## 2024-04-13 VITALS — BP 120/77 | HR 87 | Ht 70.0 in | Wt 148.0 lb

## 2024-04-13 DIAGNOSIS — J449 Chronic obstructive pulmonary disease, unspecified: Secondary | ICD-10-CM | POA: Diagnosis not present

## 2024-04-13 DIAGNOSIS — Z720 Tobacco use: Secondary | ICD-10-CM | POA: Diagnosis not present

## 2024-04-13 MED ORDER — UMECLIDINIUM-VILANTEROL 62.5-25 MCG/ACT IN AEPB: 1.0000 | INHALATION_SPRAY | Freq: Every day | RESPIRATORY_TRACT | 6 refills | Status: DC

## 2024-04-13 MED ORDER — NORTRIPTYLINE HCL 25 MG PO CAPS: 25.0000 mg | ORAL_CAPSULE | Freq: Every day | ORAL | 3 refills | Status: DC

## 2024-04-13 NOTE — Assessment & Plan Note (Addendum)
 Tobacco use disorder with moderate/high nicotine  dependence of > 40 years duration in a patient who is fair candidate for success because of needing to get colostomy bag removed, surgery cannot proceed until patient is quit. Next appointment for surgery is on Nov 18th. Additionally, use of naltrexone  has helped patient with cessation attempts, as cigarettes started tasting bad, and patient purposefully buys poor tasting cigarettes, all have which helped with cutting back from 2 ppd to current use.   -Initiated nortriptyline  25 mg (1 tablet) for 1 week then to increase to 50 mg (2 tablets) as tolerated (lack of daytime sedation) -Discontinued Trelegy (umeclidinium / vilanterol / fluticasone ) sample and restarted Anoro Ellipta  (umeclidinium / vilanterol)  -Counseled to rinse and spit with use of Flovent  (fluticasone ) inhaler to prevent mouth irritation -Discussed cost of medications and cost of cigarettes that limit his financial stability - but patient able to pay for medications currently -Provided information on 1 800-QUIT NOW support program.

## 2024-04-13 NOTE — Patient Instructions (Addendum)
 Nice to see you today!   Medication Changes: - START nortriptyline  25 mg (1 tablet) for 1 week then increase to 50 mg (2 tablets) as long as not having a lot of daytime sedation -STOP using Trelegy (umeclidinium / vilanterol / fluticasone ) and start using your Anoro (umeclidinium / vilanterol) inhaler again   - Continue all other medication the same.  Tobacco Patient Instructions  Quitting smoking is one of the most important decisions you can make for your current and future health. Consider what you dislike about smoking and how quitting could personally benefit you. Try to cut down.   My target quit date is: Nov 1st!   Starting today, Be a Quitter!  Remind yourself why you want to quit.  Delay your first cigarette of the day for as long as possible.  Start cleaning out all pockets, drawers, and your car of cigarettes.  Getting Through the Cravings Once You Are Smoke Free: Each craving will last about 10 minutes, whether or not you smoke. Here's how to get through the cravings without cigarettes:  DELAY: Tell yourself that you'll wait for the next craving. Do it every time! DEEP BREATHS: One reason smoking feels good is because you breathe in deeply to inhale. Take four slow, deep breaths and feel the relaxation without the hamful effects of cigarettes. DRINK WATER : Drink a glass of cool water . It will give your hands and mouth something to do and will help flush the nicotine  out of your system faster. DIVERT: Do something else -- brush your teeth, take a walk, call a friend who can offer you support. Just moving onto something other than thinking about cigarettes will move you through the craving.   Frequently Asked Questions  What can I do when I get the urge to smoke? To get through the urge to smoke, try the following:  Review your reasons for quitting and think of all the benefits to your health, your finances, and your family.  Remind yourself that there is no such thing as  just one cigarette -- or even one puff.  Ride out the desire to smoke. Use the 4 Os -- Delay, Deep Breaths, Drink Water  and Divert to get you through. The craving will go away eventually. Do not fool yourself into thinking you can have just one cigarette.  Any tips on how to deal with stress? Stress is a natural part of life. The key is to deal with it without reaching for a cigarette. Taking deep breaths, counting backwards from 10 and asking yourself 1-how big a deal is this?"  Writing down your feelings, talking with a friend and doing things like positive self-talk and meditation are some other ways that people deal with daily stress.  What if I start smoking again? Slips happen. Most people try to quit smoking a few times before they are successful. Don't beat yourself up if this happens to you! Ask yourself if this was a slip or a relapse. A slip is a one-time mistake that is quickly corrected. A relapse is going back to your old smoking habits.   If you slip, don't give up. Think of it as a learning experience. Ask yourself what went wrong and renew your commitment to staying away from smoking for good.  If you relapse, try not to get discouraged. Ask yourself the question "What caused me to start smoking?" Figure out what helped you and what didn't when you tried to quit. Knowing why you relapsed is useful information  for your next attempt to quit.  Please bring all medications to your clinic visits.  Please arrive 10-15 minutes prior to your scheduled visit time.

## 2024-04-13 NOTE — Progress Notes (Signed)
 S:   Chief Complaint  Patient presents with   Medication Management    Med Access - Trelegy   58 y.o. male who presents for evaluation/assistance with tobacco dependence. Patient is in good spirits. Patient on fixed disability income and struggles with affording medications   Patient was referred and last seen by Primary Care Provider, Dr. Romelle, on 04/07/2024. Recently started taking naltrexone  last week and reports that it is working well for him in cutting back drinking and smoking (makes cigarettes taste bad).  At last visit, provided Trelegy (umeclidinium / vilanterol / fluticasone ) sample in place of Anoro (umeclidinium / vilanterol) Ellipta.   PMH is significant for COPD, tobacco/alcohol ([employer]) use, hypertension, BPD, currently with colostomy bag pending surgery.   Reports he is taking Trelegy (umeclidinium / vilanterol / fluticasone ) & Fluticasone  inhalers -- since starting Trelegy (umeclidinium / vilanterol / fluticasone ) he has been having throat irritation. Discussed rinsing and spitting after use & Dr. Manon assessed patients throat.   Age when started using tobacco on a daily basis > 40 years ago (14-15 YO). Brand smoked Marlboro Red until 3 months ago and then switched to a cheaper option [employer] w/ filter. Number of cigarettes/day 1 ppd.but does not smoke a whole cigarette -- used to smoke 2 ppd and started cutting back Estimated nicotine  content per cigarette (mg) ~ 1 mg.  Estimated nicotine  intake per day ~ 20 mg.   Smokes maybe 1 cigarette when waking up at night d/t hard time sleeping (3-4x weekly) if cannot fall back asleep smokes more.    Fagerstrom Score Question Scoring Patient Score  How soon after waking do you smoke your first cigarette? <5 mins (3) 5-30 mins (2) 31-60 mins (1) >60 mins (0) 2 (w/ first cup of coffee)  Do you find it difficult NOT to smoke in places where you shouldn't? Yes(1) No (0) 1  Which cigarette would you most hate to  give up? First one in AM (1) Any other one (0)  1   How many cigarettes do you smoke/day? 10 or less (0) 11-20 (1) 21-30 (2) >30 (3) 1-2  Do you smoke more during the first few hours after waking? Yes (1) No (0) 1  Do you smoke if you are so ill you cannot get out of bed? Yes (1) No (0) 0    Total Score   6-7  Score interpretation: low 1-2, low-to-moderate 3-4, moderate 5-7, high >7  Most recent quit attempt years ago due to being very sick & having ICU admission - was able to quit for 3 months   Medications used in past cessation efforts include: NRT patches, gum, lozenges, bupropion, varenicline  -- reports that NOTHING helped  Rates IMPORTANCE of quitting tobacco on 1-10 scale of 8. Rates CONFIDENCE of quitting tobacco on 1-10 scale of 8-9.  Most common triggers to use tobacco include stress   Motivation to quit: trying to have procedure for colostomy bag done at [facility] and that cannot be done until he stops smoking -- wants to be quit by Nov 18th (day of appointment)  O: Clinical ASCVD: No  The 10-year ASCVD risk score (Arnett DK, et al., 2019) is: 10.3%   Values used to calculate the score:     Age: 22 years     Clincally relevant sex: Male     Is Non-Hispanic African American: No     Diabetic: No     Tobacco smoker: Yes     Systolic  Blood Pressure: 127 mmHg     Is BP treated: Yes     HDL Cholesterol: 42 mg/dL     Total Cholesterol: 153 mg/dL  Review of Systems  Musculoskeletal:  Neck pain: Throat irritation.  All other systems reviewed and are negative.   Physical Exam Vitals reviewed.  Constitutional:      Appearance: Normal appearance.  Pulmonary:     Effort: Pulmonary effort is normal.  Neurological:     Mental Status: He is alert.  Psychiatric:        Mood and Affect: Mood normal.        Behavior: Behavior normal.        Thought Content: Thought content normal.        Judgment: Judgment normal.    A/P: Tobacco use disorder with  moderate/high nicotine  dependence of > 40 years duration in a patient who is fair candidate for success because of needing to get colostomy bag removed, surgery cannot proceed until patient is quit. Next appointment for surgery is on Nov 18th. Additionally, use of naltrexone  has helped patient with cessation attempts, as cigarettes started tasting bad, and patient purposefully buys poor tasting cigarettes, all have which helped with cutting back from 2 ppd to current use.   -Initiated nortriptyline  25 mg (1 tablet) for 1 week then to increase to 50 mg (2 tablets) as tolerated (lack of daytime sedation) -Discontinued Trelegy (umeclidinium / vilanterol / fluticasone ) sample and restarted Anoro Ellipta  (umeclidinium / vilanterol)  -Counseled to rinse and spit with use of Flovent  (fluticasone ) inhaler to prevent mouth irritation -Discussed cost of medications and cost of cigarettes that limit his financial stability - but patient able to pay for medications currently -Provided information on 1 800-QUIT NOW support program.   Written patient instructions provided. Patient verbalized understanding of treatment plan.  Total time in face to face counseling 26 minutes.    Follow-up:  Pharmacist 05/04/2024 PCP clinic visit TBD Patient seen with Estefana Blase, PharmD Candidate - PY4 student,  Dr. Manon, DO

## 2024-04-15 NOTE — Progress Notes (Signed)
 Reviewed and agree with Dr Rennis plan.

## 2024-04-27 ENCOUNTER — Ambulatory Visit: Admitting: Family Medicine

## 2024-05-04 ENCOUNTER — Ambulatory Visit: Admitting: Pharmacist

## 2024-05-04 ENCOUNTER — Telehealth: Payer: Self-pay | Admitting: Pharmacist

## 2024-05-04 NOTE — Telephone Encounter (Signed)
 Attempted to contact patient for follow-up of missed appointment for tobacco use / cessation    Phone - no longer in-service.  No alternate for patient.   Total time with patient call and documentation of interaction: 3 minutes.

## 2024-06-10 ENCOUNTER — Encounter: Payer: Self-pay | Admitting: Family Medicine

## 2024-06-10 ENCOUNTER — Other Ambulatory Visit (HOSPITAL_COMMUNITY): Payer: Self-pay

## 2024-06-10 ENCOUNTER — Ambulatory Visit (INDEPENDENT_AMBULATORY_CARE_PROVIDER_SITE_OTHER): Admitting: Family Medicine

## 2024-06-10 VITALS — BP 162/104 | HR 85 | Ht 70.0 in | Wt 151.4 lb

## 2024-06-10 DIAGNOSIS — Z720 Tobacco use: Secondary | ICD-10-CM

## 2024-06-10 DIAGNOSIS — F1090 Alcohol use, unspecified, uncomplicated: Secondary | ICD-10-CM

## 2024-06-10 DIAGNOSIS — I10 Essential (primary) hypertension: Secondary | ICD-10-CM | POA: Diagnosis not present

## 2024-06-10 DIAGNOSIS — T1490XA Injury, unspecified, initial encounter: Secondary | ICD-10-CM

## 2024-06-10 MED ORDER — MELOXICAM 15 MG PO TABS: 15.0000 mg | ORAL_TABLET | Freq: Every day | ORAL | 0 refills | Status: DC

## 2024-06-10 MED ORDER — PANTOPRAZOLE SODIUM 40 MG PO TBEC: 40.0000 mg | DELAYED_RELEASE_TABLET | Freq: Every day | ORAL | 1 refills | Status: DC

## 2024-06-10 NOTE — Patient Instructions (Addendum)
 VISIT SUMMARY: Today, we addressed the pain and injuries you sustained from a garage door accident, discussed a hip mass, and reviewed your medication adherence and substance use. We also talked about your history of gastrointestinal bleeding and hypertension management.  YOUR PLAN: MULTIPLE TRAUMATIC INJURIES: You have acute injuries with pain and limited motion in your left index finger, and tenderness and bruising in your right shoulder, arm, knee, and ribs. -We ordered x-rays of your chest and right knee to assess the injuries. -You were prescribed meloxicam  for pain relief. Please use it cautiously due to your history of bleeding ulcers and be sure to take with the protonix . -Monitor for any signs of bleeding and stop taking meloxicam  if you notice any.  GREATER TROCHANTERIC PAIN SYNDROME: You have chronic right hip pain that has been worsening. -If the pain persists after meloxicam , we may consider a steroid injection, similar to what you had previously.  ALCOHOL USE DISORDER: You have chronic alcohol use and have experienced withdrawal symptoms when trying to quit. You previously used naltrexone  but stopped due to cost. -We provided you with contact information for addiction psychiatry to help manage your alcohol use. -We discussed the possibility of telephone follow-up visits to support you.  TOBACCO USE DISORDER: You have chronic tobacco use and have recently reduced your smoking, but are finding it difficult to quit due to vaping. -We discussed the challenges of quitting tobacco and vaping, and will continue to support you in this process. Dorine LOISE Maier, LCSW will be contacted to call you again  ESSENTIAL HYPERTENSION: Your hypertension management has been affected by your inability to afford medication, which likely has caused elevated blood pressure. -Please refill your blood pressure medication at the beginning of the month and let us  know if you have worsening  symptoms.  Please let me know if you have any other questions.  Dr. Tharon

## 2024-06-10 NOTE — Progress Notes (Signed)
   SUBJECTIVE:   CHIEF COMPLAINT / HPI:  Discussed the use of AI scribe software for clinical note transcription with the patient, who gave verbal consent to proceed.  History of Present Illness Timothy [name] is a 58 year old male who presents with pain following a garage door accident.  Musculoskeletal trauma - Sustained injuries to left index finger, right shoulder, right knee, and right ribs two days ago due to a garage door accident - Pain in all affected areas has worsened since the injury - Notable bruising and tenderness in the right ribs and right shoulder - Difficulty bending the left index finger due to pain  Hip pain - Palpable area of pain over the side of the hip present for approximately one month - Worsening in discomfort over time  Medication nonadherence - Unable to afford medications, including naltrexone  and antihypertensive medication, for over a week due to financial difficulties - Plans to refill prescriptions at the beginning of the month when he has more money  Substance use - Consumes three to four beers daily - Smokes one pack of cigarettes every day and a half, substituting with vaping - Remains interested in quitting though recognizes this is very hard for him  PERTINENT  PMH / PSH: colostomy in place  OBJECTIVE:  BP (!) 162/104   Pulse 85   Ht 5' 10 (1.778 m)   Wt 151 lb 6.4 oz (68.7 kg)   SpO2 100%   BMI 21.72 kg/m   Physical Exam GENERAL: Alert, cooperative, well developed, no acute distress CHEST: Breathing and speaking comfortably on RA. Faint bruise overlying R anterolateral chest wall. EXTREMITIES: No cyanosis or edema. Bruising to R shoulder and upper arm. L third finger with abrasion and pain with movement of the finger. R knee painful to palpation and movement. No pronounced swelling or erythema of these areas. NEUROLOGICAL: Cranial nerves grossly intact, Moves all extremities without gross motor or sensory  deficit  ASSESSMENT/PLAN:   Assessment & Plan Trauma Acute injuries after being hit with a garage door with pain and limited motion in left index finger, tenderness and bruising in right shoulder, arm, knee, and ribs. Pain worsening. Also some right hip pain which could also be greater trochanter pain syndrome. - Ordered x-ray of chest, right shoulder, right knee, and left hand - Prescribed meloxicam  for pain for 7 days (called and clarified Rx with pharmacy); advised caution due to history of bleeding ulcers and importance of taking with protonix  - Follow up pending radiographs, can consider right greater trochanter pain injection in future Essential hypertension, benign Hypertension management affected by inability to afford medication. Asymptomatic today. - Advised to refill blood pressure medication at beginning of month. - Will send message to pharmacy tech to aid in cost. Alcohol use disorder Chronic alcohol use with withdrawal symptoms when stopping completely, previous naltrexone  use discontinued due to cost, missed addiction psychiatry referral. - Asked addiction therapist to reach back out to patient over Epic message - Discussed potential for telephone follow-up visits. Tobacco use Chronic tobacco use with recent reduction, difficulty quitting due to vaping substitution. - Discussed recommendations to quit and acknowledged challenges of quitting tobacco and vaping. - Addiction specialist contacted to reach back out to patient as above  Stuart Redo, MD [facility]

## 2024-06-10 NOTE — Assessment & Plan Note (Signed)
 Chronic tobacco use with recent reduction, difficulty quitting due to vaping substitution. - Discussed recommendations to quit and acknowledged challenges of quitting tobacco and vaping. - Addiction specialist contacted to reach back out to patient as above

## 2024-06-10 NOTE — Assessment & Plan Note (Signed)
 Chronic alcohol use with withdrawal symptoms when stopping completely, previous naltrexone  use discontinued due to cost, missed addiction psychiatry referral. - Asked addiction therapist to reach back out to patient over Epic message - Discussed potential for telephone follow-up visits.

## 2024-06-10 NOTE — Assessment & Plan Note (Signed)
 Hypertension management affected by inability to afford medication. Asymptomatic today. - Advised to refill blood pressure medication at beginning of month. - Will send message to pharmacy tech to aid in cost.

## 2024-06-11 ENCOUNTER — Telehealth (HOSPITAL_COMMUNITY): Payer: Self-pay | Admitting: Licensed Clinical Social Worker

## 2024-06-11 NOTE — Telephone Encounter (Signed)
 The therapist attempts to reach Marcey at the request of Dr. Tharon leaving a HIPAA-compliant voicemail with his direct callback number.  Zell Maier, MA, LCSW, [facility], LCAS 06/11/2024

## 2024-06-12 ENCOUNTER — Encounter: Payer: Self-pay | Admitting: Family Medicine

## 2024-06-12 NOTE — Progress Notes (Signed)
 Attempted to call patient to check in given XR not completed. LVM asking to call back or send me a MyChart message with updates on his condition.

## 2024-06-14 ENCOUNTER — Telehealth: Payer: Self-pay

## 2024-06-14 ENCOUNTER — Ambulatory Visit
Admission: RE | Admit: 2024-06-14 | Discharge: 2024-06-14 | Disposition: A | Source: Ambulatory Visit | Attending: Family Medicine | Admitting: Family Medicine

## 2024-06-14 DIAGNOSIS — T1490XA Injury, unspecified, initial encounter: Secondary | ICD-10-CM

## 2024-06-14 MED ORDER — MELOXICAM 15 MG PO TABS: 15.0000 mg | ORAL_TABLET | Freq: Every day | ORAL | 0 refills | Status: AC

## 2024-06-14 NOTE — Telephone Encounter (Signed)
 Spoke to patient. Pain is improved with meloxicam . Mostly now in his shoulder. Pain also in the finger, but he has been wearing his splint he received over the counter. XR have been completed. No significant abnormalities on my read, but I will await radiology impression. Refilled meloxicam  for another week in the meantime. He knows to let me know if he is worsening before I get back to him with XR results.

## 2024-06-14 NOTE — Telephone Encounter (Signed)
 Patient calls nurse line requesting a refill on Meloxicam .   He reports he was only given a 7 day supply. He reports he has he close to running out.   Pharmacy reports he picked prescription up on 11/21 for #7 pills.   Patient reports he is still in a great deal of pain. He is requesting another weeks worth of Meloxicam .   He reports he did go and get his imaging done.   Advised will forward to PCP for advisement.

## 2024-06-16 ENCOUNTER — Ambulatory Visit: Payer: Self-pay | Admitting: Family Medicine

## 2024-06-16 DIAGNOSIS — M19019 Primary osteoarthritis, unspecified shoulder: Secondary | ICD-10-CM | POA: Insufficient documentation

## 2024-06-16 NOTE — Progress Notes (Signed)
 Spoke to patient about XR results. R shoulder with moderate arthritis. Pain is mostly in this area still. Meloxicam  continues to help him with pain. I will refer him to PT to help with underlying strengthening exercises for long term improvement. He will keep me updated if he is worsening before this.

## 2024-06-18 ENCOUNTER — Other Ambulatory Visit: Payer: Self-pay | Admitting: Family Medicine

## 2024-06-21 ENCOUNTER — Ambulatory Visit: Admitting: Family Medicine

## 2024-06-21 VITALS — BP 138/84 | HR 92 | Ht 70.0 in | Wt 150.8 lb

## 2024-06-21 DIAGNOSIS — T8149XA Infection following a procedure, other surgical site, initial encounter: Secondary | ICD-10-CM | POA: Diagnosis present

## 2024-06-21 MED ORDER — AMOXICILLIN-POT CLAVULANATE 875-125 MG PO TABS: 1.0000 | ORAL_TABLET | Freq: Two times a day (BID) | ORAL | 0 refills | Status: DC

## 2024-06-21 MED ORDER — DOXYCYCLINE HYCLATE 100 MG PO TABS: 100.0000 mg | ORAL_TABLET | Freq: Two times a day (BID) | ORAL | 0 refills | Status: DC

## 2024-06-21 NOTE — Progress Notes (Signed)
    SUBJECTIVE:   CHIEF COMPLAINT / HPI:   Discussed the use of AI scribe software for clinical note transcription with the patient, who gave verbal consent to proceed.  Surgical wound dehiscence and drainage - Wound at the abdominal surgical site opened this morning with green drainage - History of similar episode years ago managed with doxycycline  - No fever, abdominal pain, or N/V - Previous wound issues require deep probing to break up loculations and were packed by a surgeon - Multiple prior abdominal surgeries, including sigmoid resection with colostomy placement - History of mesh infection - Last surgical follow-up on June 07, 2024 with [facility] Dr. Darral  PERTINENT  PMH / PSH: HTN, COPD, PUD, colostomy in place, AUD, tobacco use disorder  OBJECTIVE:   BP 138/84   Pulse 92   Ht 5' 10 (1.778 m)   Wt 150 lb 12.8 oz (68.4 kg)   SpO2 97%   BMI 21.64 kg/m    General: NAD, pleasant, able to participate in exam Cardiac: RRR, no murmurs. Respiratory: CTAB, normal effort, No wheezes, rales or rhonchi Abdomen: Bowel sounds present, nontender, nondistended.  Large midline surgical site well-healed with 2 areas of dehiscence (see media).  Able to express purulent drainage from both sites.  No surrounding erythema or pain with palpation.   ASSESSMENT/PLAN:   Assessment & Plan Surgical site infection Some dehiscence at midline site with purulent drainage.  Nonpainful and otherwise well-appearing, low concern for intra-abdominal infection likely superficial skin infection.  Given location and prior surgical history will cover with broad-spectrum antibiotics.  Recommend follow-up with surgeon the next 1 to 2 weeks for evaluation.  Strict ED return precautions discussed. -Doxycycline  100 mg twice daily and Augmentin  875 twice daily x 10-day course -Follow-up with [facility] surgeon, if unable to be seen in the next 2 weeks then follow-up in our office for wound evaluation -Instructed  to seek hospital care if fever, increased pain, or colostomy dysfunction occurs.    Dr. Izetta Nap, DO [facility]

## 2024-06-21 NOTE — Patient Instructions (Addendum)
 It was wonderful to see you today! Thank you for choosing [facility].   Please bring ALL of your medications with you to every visit.   Today we talked about:  I am sending in the antibiotic doxycycline  and Augmentin  for you to take twice per day for the next 10 days.  Please also call your surgeon today and I will reach out to them as well to try to get you scheduled.  I think we can hold on further imaging if you continue to do so well.  As we discussed if you have any worsening fever, abdominal pain or the site gets bigger I would like you to go to the hospital for evaluation.  If you are not able to see the surgeon in the next 2 weeks please come back and see us  for evaluation of the wound.  You can continue to dress and cover the wound but I would avoid packing it until you are seen by them.  Please follow up in 2 weeks if not seen by the surgeon   If you haven't already, sign up for My Chart to have easy access to your labs results, and communication with your primary care physician.  Call the clinic at [phone] if your symptoms worsen or you have any concerns.  Please be sure to schedule follow up at the front desk before you leave today.   Izetta Nap, DO Family Medicine

## 2024-06-23 ENCOUNTER — Other Ambulatory Visit: Payer: Self-pay

## 2024-06-23 DIAGNOSIS — I10 Essential (primary) hypertension: Secondary | ICD-10-CM

## 2024-06-23 MED ORDER — LOSARTAN POTASSIUM 100 MG PO TABS: 100.0000 mg | ORAL_TABLET | Freq: Every day | ORAL | 0 refills | Status: DC

## 2024-07-01 ENCOUNTER — Ambulatory Visit (INDEPENDENT_AMBULATORY_CARE_PROVIDER_SITE_OTHER): Admitting: Family Medicine

## 2024-07-01 VITALS — BP 130/86 | HR 93 | Temp 97.5°F | Ht 70.0 in | Wt 152.6 lb

## 2024-07-01 DIAGNOSIS — R1110 Vomiting, unspecified: Secondary | ICD-10-CM | POA: Diagnosis not present

## 2024-07-01 DIAGNOSIS — J029 Acute pharyngitis, unspecified: Secondary | ICD-10-CM

## 2024-07-01 DIAGNOSIS — J441 Chronic obstructive pulmonary disease with (acute) exacerbation: Secondary | ICD-10-CM | POA: Diagnosis present

## 2024-07-01 DIAGNOSIS — J449 Chronic obstructive pulmonary disease, unspecified: Secondary | ICD-10-CM

## 2024-07-01 DIAGNOSIS — R112 Nausea with vomiting, unspecified: Secondary | ICD-10-CM | POA: Diagnosis not present

## 2024-07-01 LAB — POC SOFIA 2 FLU + SARS ANTIGEN FIA
Influenza A, POC: NEGATIVE
Influenza B, POC: NEGATIVE
SARS Coronavirus 2 Ag: NEGATIVE

## 2024-07-01 MED ORDER — ONDANSETRON 4 MG PO TBDP: 4.0000 mg | ORAL_TABLET | Freq: Three times a day (TID) | ORAL | 0 refills | Status: DC | PRN

## 2024-07-01 MED ORDER — PREDNISONE 50 MG PO TABS: 50.0000 mg | ORAL_TABLET | Freq: Every day | ORAL | 0 refills | Status: DC

## 2024-07-01 MED ORDER — BREZTRI AEROSPHERE 160-9-4.8 MCG/ACT IN AERO: 2.0000 | INHALATION_SPRAY | Freq: Two times a day (BID) | RESPIRATORY_TRACT | Status: DC

## 2024-07-01 MED ORDER — STIOLTO RESPIMAT 2.5-2.5 MCG/ACT IN AERS: 2.0000 | INHALATION_SPRAY | Freq: Every day | RESPIRATORY_TRACT | 3 refills | Status: DC

## 2024-07-01 NOTE — Patient Instructions (Signed)
°  VISIT SUMMARY: Today, we addressed your COPD exacerbation, nausea and vomiting, and right ear pain. We also discussed your ongoing wound healing and colostomy care.  YOUR PLAN: CHRONIC OBSTRUCTIVE PULMONARY DISEASE (COPD) WITH ACUTE EXACERBATION: Your COPD symptoms have worsened, with increased wheezing and green sputum. -I provided you with a sample of Breztri. Take two puffs twice daily. -I sent a prescription for Stiolto Respimat to check your insurance coverage. -I prescribed prednisone . Take it daily for five days. -Monitor for worsening symptoms such as increased wheezing or difficulty breathing.  NAUSEA AND VOMITING: You have been experiencing nausea and vomiting for the past few days. -I prescribed Zofran  for your nausea. -Monitor for worsening symptoms such as increased vomiting or inability to keep down fluids.  ACUTE PHARYNGITIS: You have a sore throat and cough with green sputum. You are already on antibiotics for your skin wound, which may help if the cause is bacterial. -Await the results of your COVID-19 swab. -Continue your current antibiotic course.                      Contains text generated by Abridge.                                 Contains text generated by Abridge.

## 2024-07-01 NOTE — Assessment & Plan Note (Signed)
 Difficulty with insurance coverage for anoro Provided sample of breztri today Discussed with Dr. Koval, tried to send in stiolto respimat to see if this will be covered Close PCP f/u scheduled 12/24

## 2024-07-01 NOTE — Progress Notes (Signed)
° ° °  SUBJECTIVE:   CHIEF COMPLAINT / HPI: sore throat, vomiting  Discussed the use of AI scribe software for clinical note transcription with the patient, who gave verbal consent to proceed.  History of Present Illness Timothy [name] is a 58 year old male with COPD who presents with nausea, vomiting, and ear pain.  Nausea and vomiting - Nausea for the past 2-3 days - Two episodes of vomiting in the same period - Limited oral intake, but able to tolerate water  - Wakes with dry mouth - Last dose of Zofran  taken two nights ago; no refills available  Respiratory symptoms - Chronic obstructive pulmonary disease (COPD) - Cough with green sputum - Increased wheezing - Partial relief with albuterol  inhaler - No longer able to use Anoro due to insurance coverage - Currently on Medicaid and Medicare - Smokes tobacco - History of three COVID-19 infections, last episode severe - Currently awaiting COVID-19 swab result  Wound healing - Currently taking doxycycline  and another antibiotic augmentin  for a healing wound - Three days remaining in a ten-day antibiotic course - Wound is healing and no longer draining  Colostomy - Colostomy present with output  Hypertension - Takes losartan  for hypertension - Took dose this morning  Alcohol use - Uses alcohol     PERTINENT  PMH / PSH: HTN, COPD, mood disorder, 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  Cardiac: RRR, no murmurs auscultated Respiratory: bilateral wheezing, normal WOB on RA Abdomen: soft, non-tender, non-distended. Colostomy bag in place with brown output in bag. He has a wound that  is dressed and not draining as much compared to prior image. Not warm or tender. Extremities: warm and well perfused, no edema or cyanosis Skin: warm and dry, no rashes noted Neuro: alert, no obvious focal deficits, speech normal Psych: Normal affect and mood  ASSESSMENT/PLAN:    Assessment & Plan COPD exacerbation (HCC) Nausea and vomiting, unspecified vomiting type Sore throat Likely in setting of viral URI Recent abd wall infection treated w abx, few days left in course Does not have an acute abdomen on exam and infection appears improving. Has colostomy output. Low suspicion for peritonitis or obstruction. Does have some wheezing on exam. Normal O2 sat.  -Rx prednisone  x 5 d -Continue abx -zofran  prn for nausea -covid/flu neg Chronic obstructive pulmonary disease, unspecified COPD type (HCC) Difficulty with insurance coverage for anoro Provided sample of breztri today Discussed with Dr. Koval, tried to send in stiolto respimat to see if this will be covered Close PCP f/u scheduled 12/24  Discussed return precautions and supportive care  Payton Coward, MD [facility]

## 2024-07-02 ENCOUNTER — Other Ambulatory Visit (HOSPITAL_COMMUNITY): Payer: Self-pay

## 2024-07-02 ENCOUNTER — Telehealth: Payer: Self-pay

## 2024-07-02 MED ORDER — UMECLIDINIUM-VILANTEROL 62.5-25 MCG/ACT IN AEPB: 1.0000 | INHALATION_SPRAY | Freq: Every day | RESPIRATORY_TRACT | 11 refills | Status: DC

## 2024-07-02 NOTE — Telephone Encounter (Signed)
 Rec'd PA request for Stiolto Respimat inhaler.   Per test claim: PRODUCT NOT ON FORMULARY.  Anoro, Incruse, & Bevespi covered

## 2024-07-02 NOTE — Telephone Encounter (Signed)
 Patient contacted for follow-up of need for inhaler - Stiolto (tiotropium / olodaterol)   Since last contact patient reports ANORO (UMECLIDINIUM / VILANTEROL) did very well for him BUT his pharmacy could not provide, he was not sure why.  Discussed how he can contact pharmacy to determine if they would have either Anoro (umeclidinium / vilanterol) or Bevespi.   Contacted pharmacy and inquired about Anoro (umeclidinium / vilanterol) as potential availability.  Pharmacy team member was unaware of any supply issue.  I agreed to send Anoro (umeclidinium / vilanterol) to the pharmacy and try to have filled again.   We can consider sending to a [facility] pharmacy for access if [facility] is unable to fill.   Sent new Rx for Anoro (umeclidinium / vilanterol) Discontinued prescription for Stiolto  Total time with patient call and documentation of interaction: 13 minutes.

## 2024-07-05 NOTE — Telephone Encounter (Signed)
 Reviewed and agree with Dr Rennis plan.

## 2024-07-14 ENCOUNTER — Ambulatory Visit: Admitting: Family Medicine

## 2024-07-14 ENCOUNTER — Encounter: Payer: Self-pay | Admitting: Family Medicine

## 2024-07-14 VITALS — BP 132/70 | HR 134 | Wt 148.8 lb

## 2024-07-14 DIAGNOSIS — Z720 Tobacco use: Secondary | ICD-10-CM | POA: Diagnosis not present

## 2024-07-14 DIAGNOSIS — Z23 Encounter for immunization: Secondary | ICD-10-CM | POA: Diagnosis not present

## 2024-07-14 DIAGNOSIS — I10 Essential (primary) hypertension: Secondary | ICD-10-CM | POA: Diagnosis not present

## 2024-07-14 DIAGNOSIS — F1090 Alcohol use, unspecified, uncomplicated: Secondary | ICD-10-CM | POA: Diagnosis not present

## 2024-07-14 DIAGNOSIS — F339 Major depressive disorder, recurrent, unspecified: Secondary | ICD-10-CM

## 2024-07-14 DIAGNOSIS — R000 Tachycardia, unspecified: Secondary | ICD-10-CM

## 2024-07-14 DIAGNOSIS — G5602 Carpal tunnel syndrome, left upper limb: Secondary | ICD-10-CM

## 2024-07-14 DIAGNOSIS — J449 Chronic obstructive pulmonary disease, unspecified: Secondary | ICD-10-CM

## 2024-07-14 DIAGNOSIS — F3162 Bipolar disorder, current episode mixed, moderate: Secondary | ICD-10-CM

## 2024-07-14 DIAGNOSIS — F410 Panic disorder [episodic paroxysmal anxiety] without agoraphobia: Secondary | ICD-10-CM

## 2024-07-14 MED ORDER — NORTRIPTYLINE HCL 50 MG PO CAPS: 50.0000 mg | ORAL_CAPSULE | Freq: Every day | ORAL | 3 refills | Status: AC

## 2024-07-14 MED ORDER — UMECLIDINIUM-VILANTEROL 62.5-25 MCG/ACT IN AEPB: 1.0000 | INHALATION_SPRAY | Freq: Every day | RESPIRATORY_TRACT | 11 refills | Status: AC

## 2024-07-14 MED ORDER — FLUTICASONE PROPIONATE HFA 110 MCG/ACT IN AERO: 2.0000 | INHALATION_SPRAY | Freq: Two times a day (BID) | RESPIRATORY_TRACT | 12 refills | Status: AC

## 2024-07-14 MED ORDER — NICOTINE 21 MG/24HR TD PT24: 21.0000 mg | MEDICATED_PATCH | Freq: Every day | TRANSDERMAL | 1 refills | Status: AC

## 2024-07-14 MED ORDER — SERTRALINE HCL 100 MG PO TABS: 150.0000 mg | ORAL_TABLET | Freq: Every day | ORAL | 3 refills | Status: AC

## 2024-07-14 MED ORDER — LOSARTAN POTASSIUM 100 MG PO TABS: 100.0000 mg | ORAL_TABLET | Freq: Every day | ORAL | 0 refills | Status: AC

## 2024-07-14 MED ORDER — DIVALPROEX SODIUM ER 500 MG PO TB24: 500.0000 mg | ORAL_TABLET | Freq: Every day | ORAL | 1 refills | Status: AC

## 2024-07-14 MED ORDER — SHINGRIX 50 MCG/0.5ML IM SUSR: INTRAMUSCULAR | 1 refills | Status: AC

## 2024-07-14 MED ORDER — ALBUTEROL SULFATE HFA 108 (90 BASE) MCG/ACT IN AERS: 2.0000 | INHALATION_SPRAY | Freq: Four times a day (QID) | RESPIRATORY_TRACT | 3 refills | Status: AC | PRN

## 2024-07-14 MED ORDER — BUSPIRONE HCL 15 MG PO TABS: 15.0000 mg | ORAL_TABLET | Freq: Every morning | ORAL | 0 refills | Status: AC

## 2024-07-14 MED ORDER — GABAPENTIN 100 MG PO CAPS: 200.0000 mg | ORAL_CAPSULE | Freq: Every day | ORAL | 1 refills | Status: AC

## 2024-07-14 MED ORDER — PROMETHAZINE HCL 12.5 MG PO TABS: 12.5000 mg | ORAL_TABLET | Freq: Four times a day (QID) | ORAL | 0 refills | Status: AC | PRN

## 2024-07-14 MED ORDER — PANTOPRAZOLE SODIUM 40 MG PO TBEC: 40.0000 mg | DELAYED_RELEASE_TABLET | Freq: Every day | ORAL | 1 refills | Status: AC

## 2024-07-14 NOTE — Assessment & Plan Note (Signed)
 Smokes 30 cigarettes/day (down from 40/day), motivated to quit. Previously used nicotine  patches and Chantix , has full prescription of Chantix  at home. Experiences baseline exertional dyspnea improved with albuterol . - Prescribed higher dose nicotine  patches. - Continue Chantix  as previously prescribed. - Continue inhalers

## 2024-07-14 NOTE — Assessment & Plan Note (Signed)
 Confirmed by EMG testing, experiences finger numbness, dislikes brace, previous steroid injection effective. - Schedule appointment for steroid injection after the first of the year.

## 2024-07-14 NOTE — Assessment & Plan Note (Signed)
-   Refilled depakote , buspirone , nortriptyline , and sertraline  to help treat mania and depressive symptoms - Referred to psychiatry for further evaluation - PHQ-9 negative #9

## 2024-07-14 NOTE — Assessment & Plan Note (Signed)
Controlled  Continue current management

## 2024-07-14 NOTE — Patient Instructions (Addendum)
 VISIT SUMMARY: Today, we discussed the progress of your chronic wound, smoking cessation, alcohol use, COPD, carpal tunnel syndrome, left arm pain, and medication refills. You received a pneumonia vaccine and a prescription for the shingles vaccine.  YOUR PLAN: CHRONIC NON-HEALING WOUND OF ABDOMEN: Your wound is improving with reduced drainage and no signs of infection. -Continue follow-up with your surgeon on the 17th.  NICOTINE  DEPENDENCE: You are motivated to quit smoking and have previously used nicotine  patches and Chantix . -Start using higher dose nicotine  patches. -Continue taking Chantix  as previously prescribed.  ALCOHOL DEPENDENCE: You consume about six alcoholic drinks per day and have experienced nausea possibly from naltrexone . -Stop taking naltrexone  since this is not helping much now. -Focus on quitting smoking first.  CHRONIC OBSTRUCTIVE PULMONARY DISEASE (COPD): You experience shortness of breath with physical activity and use an albuterol  inhaler. -Follow up to get your new Anoro inhaler prescription. -Monitor your symptoms and report any worsening before you get your new inhaler.  CARPAL TUNNEL SYNDROME: You have finger numbness and find the brace too limiting. -Schedule an appointment for a steroid injection after the first of the year.  OSTEOARTHRITIS OF UPPER LIMB: You have chronic aching and popping in your left arm, likely due to arthritis. -Consider physical therapy if your symptoms get worse.  NAUSEA AND VOMITING: You experience intermittent nausea and occasional vomiting with viral illnesses. -Continue using Phenergan  as needed.  GENERAL HEALTH MAINTENANCE: You are considering pneumonia and shingles vaccines. -You received the pneumonia vaccine today. -You have a printed prescription for the shingles vaccine.  Please let me know if you have any other questions.  Dr. Tharon

## 2024-07-14 NOTE — Assessment & Plan Note (Signed)
 Consumes six drinks/day, occasionally abstains. Experiences nausea possibly from naltrexone . Macrocytic anemia likely due to this; patient prefers to wait on lab collection today. - Discontinued naltrexone . - Focus on smoking cessation first, revisit alcohol use later.

## 2024-07-14 NOTE — Progress Notes (Signed)
 "  SUBJECTIVE:   CHIEF COMPLAINT / HPI:  Discussed the use of AI scribe software for clinical note transcription with the patient, who gave verbal consent to proceed.  History of Present Illness Timothy [name] is a 58 year old male who presents for follow-up on wound healing and management of multiple chronic conditions.  Chronic wound - Chronic wound present for fourteen years, recently opened and infected but improving - Small amount of drainage - No fever - Prior treatment with doxycycline  and Augmentin  reduced severity - Surgical follow-up planned  Nicotine  dependence - Smoked for over forty years - Currently smoking approximately one and a half packs per day, reduced from two packs per day - Attempting to quit - Previously used nicotine  patches and Chantix  - Willing to restart medication support for smoking cessation  Alcohol use disorder - Consumes approximately six alcoholic drinks per day - Restarted naltrexone  last week for alcohol cravings - Vomiting episode after restarting naltrexone   Nausea and vomiting - Intermittent nausea with occasional vomiting when he has viral infections - Uses Phenergan  as needed - Vomiting episode last week associated with naltrexone   Chronic obstructive pulmonary disease (copd) - Exertional shortness of breath with stair climbing and physical activity at times - Uses albuterol  inhaler - Awaiting new Anoro prescription  Carpal tunnel syndrome - Finger numbness, dx with carpal tunnel by EMG in the past - Owns a brace but finds it too limiting - Interested in injection  Left arm pain and arthritis - Chronic aching and popping of the left arm following prior garage door injury - Symptoms worsen when sleeping on left side and upon waking - Diagnosed with arthritis in the affected area - Did not go to PT since he feels better  Medication management - Current medications include sertraline , lisinopril , Protonix , Flexeril , and  albuterol  inhaler - Running low on medications, especially for bipolar disorder, and requires refills today   OBJECTIVE:  BP 132/70   Pulse (!) 134   Wt 148 lb 12.8 oz (67.5 kg)   SpO2 98%   BMI 21.35 kg/m   Physical Exam GENERAL: Alert, cooperative, well developed, no acute distress CHEST: Diffuse expiratory wheezing throughout, stable on RA, no rhonchi or crackles CARDIOVASCULAR: Tachycardic, regular rhythm, S1 and S2 normal without murmurs ABDOMEN: Soft, non-tender, non-distended, colostomy bag in place, surgical site much improved from prior image with just mild drainage on bandage NEUROLOGICAL: Cranial nerves grossly intact, moves all extremities without gross motor or sensory deficit  ASSESSMENT/PLAN:   Assessment & Plan Chronic obstructive pulmonary disease, unspecified COPD type (HCC) Tobacco use Smokes 30 cigarettes/day (down from 40/day), motivated to quit. Previously used nicotine  patches and Chantix , has full prescription of Chantix  at home. Experiences baseline exertional dyspnea improved with albuterol . - Prescribed higher dose nicotine  patches. - Continue Chantix  as previously prescribed. - Continue inhalers Essential hypertension, benign Controlled. Continue current management. Encounter for immunization Received pneumococcal vaccination today. Printed shingrix  given to take to pharmacy. Bipolar disorder, current episode mixed, moderate (HCC) Depression, recurrent Panic anxiety syndrome - Refilled depakote , buspirone , nortriptyline , and sertraline  to help treat mania and depressive symptoms - Referred to psychiatry for further evaluation - PHQ-9 negative #9 Alcohol use disorder Consumes six drinks/day, occasionally abstains. Experiences nausea possibly from naltrexone . Macrocytic anemia likely due to this; patient prefers to wait on lab collection today. - Discontinued naltrexone . - Focus on smoking cessation first, revisit alcohol use later. Carpal tunnel  syndrome of left wrist Confirmed by EMG testing, experiences finger numbness, dislikes  brace, previous steroid injection effective. - Schedule appointment for steroid injection after the first of the year. Tachycardia Elevated today in setting of recent cigarette use before clinic along with ?mania with history of bipolar disorder. No chest pain, less likely ACS. Stable on RA, less likely PE. - Tobacco use cessation as above - Refilled bipolar medications - Return to care if experiencing chest pain or worsening symptoms   Stuart Redo, MD [facility]  "

## 2024-07-28 ENCOUNTER — Ambulatory Visit: Payer: Medicare Other

## 2024-07-28 VITALS — Ht 70.0 in | Wt 148.0 lb

## 2024-07-28 DIAGNOSIS — Z0000 Encounter for general adult medical examination without abnormal findings: Secondary | ICD-10-CM

## 2024-07-28 NOTE — Patient Instructions (Signed)
 Timothy [name],  Thank you for taking the time for your Medicare Wellness Visit. I appreciate your continued commitment to your health goals. Please review the care plan we discussed, and feel free to reach out if I can assist you further.  Please note that Annual Wellness Visits do not include a physical exam. Some assessments may be limited, especially if the visit was conducted virtually. If needed, we may recommend an in-person follow-up with your provider.  Ongoing Care Seeing your primary care provider every 3 to 6 months helps us  monitor your health and provide consistent, personalized care.   1 year follow up for Medicare well visit: August 01, 2025 at 10:30 am with medicare wellness nurse in office  Recommended Screenings:  Health Maintenance  Topic Date Due   Hepatitis B Vaccine (1 of 3 - 19+ 3-dose series) Never done   Medicare Annual Wellness Visit  07/20/2024   Zoster (Shingles) Vaccine (1 of 2) 09/20/2024*   Flu Shot  10/19/2024*   COVID-19 Vaccine (1 - 2025-26 season) 10/19/2024*   Screening for Lung Cancer  12/13/2024   Colon Cancer Screening  03/16/2034   Pneumococcal Vaccine for age over 50  Completed   Hepatitis C Screening  Completed   HIV Screening  Completed   HPV Vaccine  Aged Out   Meningitis B Vaccine  Aged Out   DTaP/Tdap/Td vaccine  Discontinued  *Topic was postponed. The date shown is not the original due date.       07/28/2024    3:52 PM  Advanced Directives  Does Patient Have a Medical Advance Directive? No  Would patient like information on creating a medical advance directive? No - Patient declined    Vision: Annual vision screenings are recommended for early detection of glaucoma, cataracts, and diabetic retinopathy. These exams can also reveal signs of chronic conditions such as diabetes and high blood pressure.  Dental: Annual dental screenings help detect early signs of oral cancer, gum disease, and other conditions linked to overall health,  including heart disease and diabetes.  Please see the attached documents for additional preventive care recommendations.
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
## 2024-08-16 ENCOUNTER — Ambulatory Visit: Payer: Self-pay | Admitting: Family Medicine

## 2024-08-24 ENCOUNTER — Ambulatory Visit: Admitting: Family Medicine

## 2024-08-24 DIAGNOSIS — I2510 Atherosclerotic heart disease of native coronary artery without angina pectoris: Secondary | ICD-10-CM | POA: Insufficient documentation

## 2024-09-14 ENCOUNTER — Ambulatory Visit: Admitting: Family Medicine

## 2025-08-01 ENCOUNTER — Encounter
